# Patient Record
Sex: Female | Born: 1945 | Race: Black or African American | Hispanic: No | Smoking: Former smoker
Health system: Southern US, Community
[De-identification: ages and names within clinical notes are randomized; demographics above are authoritative.]

## PROBLEM LIST (undated history)

## (undated) ENCOUNTER — Emergency Department (HOSPITAL_COMMUNITY): Admission: EM | Payer: Medicare Other | Source: Home / Self Care

## (undated) DIAGNOSIS — M719 Bursopathy, unspecified: Secondary | ICD-10-CM

## (undated) DIAGNOSIS — E119 Type 2 diabetes mellitus without complications: Secondary | ICD-10-CM

## (undated) DIAGNOSIS — A498 Other bacterial infections of unspecified site: Secondary | ICD-10-CM

## (undated) DIAGNOSIS — R296 Repeated falls: Secondary | ICD-10-CM

## (undated) DIAGNOSIS — I10 Essential (primary) hypertension: Secondary | ICD-10-CM

## (undated) DIAGNOSIS — E785 Hyperlipidemia, unspecified: Secondary | ICD-10-CM

## (undated) DIAGNOSIS — M1990 Unspecified osteoarthritis, unspecified site: Secondary | ICD-10-CM

## (undated) DIAGNOSIS — A09 Infectious gastroenteritis and colitis, unspecified: Secondary | ICD-10-CM

## (undated) HISTORY — DX: Type 2 diabetes mellitus without complications: E11.9

## (undated) HISTORY — DX: Other bacterial infections of unspecified site: A49.8

## (undated) HISTORY — DX: Hyperlipidemia, unspecified: E78.5

## (undated) HISTORY — DX: Repeated falls: R29.6

## (undated) HISTORY — PX: COLONOSCOPY: SHX174

## (undated) HISTORY — PX: POLYPECTOMY: SHX149

## (undated) HISTORY — DX: Infectious gastroenteritis and colitis, unspecified: A09

## (undated) HISTORY — PX: TONSILLECTOMY: SUR1361

## (undated) HISTORY — PX: CATARACT EXTRACTION: SUR2

---

## 2003-04-03 ENCOUNTER — Encounter: Admission: RE | Admit: 2003-04-03 | Discharge: 2003-04-03 | Payer: Self-pay | Admitting: Internal Medicine

## 2004-04-28 ENCOUNTER — Emergency Department (HOSPITAL_COMMUNITY): Admission: EM | Admit: 2004-04-28 | Discharge: 2004-04-28 | Payer: Self-pay | Admitting: Family Medicine

## 2004-07-13 ENCOUNTER — Emergency Department (HOSPITAL_COMMUNITY): Admission: EM | Admit: 2004-07-13 | Discharge: 2004-07-13 | Payer: Self-pay | Admitting: Family Medicine

## 2005-01-22 ENCOUNTER — Emergency Department (HOSPITAL_COMMUNITY): Admission: EM | Admit: 2005-01-22 | Discharge: 2005-01-22 | Payer: Self-pay | Admitting: Family Medicine

## 2006-05-03 ENCOUNTER — Encounter: Admission: RE | Admit: 2006-05-03 | Discharge: 2006-05-03 | Payer: Self-pay | Admitting: Internal Medicine

## 2006-05-12 ENCOUNTER — Emergency Department (HOSPITAL_COMMUNITY): Admission: EM | Admit: 2006-05-12 | Discharge: 2006-05-12 | Payer: Self-pay | Admitting: Emergency Medicine

## 2006-11-23 ENCOUNTER — Emergency Department (HOSPITAL_COMMUNITY): Admission: EM | Admit: 2006-11-23 | Discharge: 2006-11-23 | Payer: Self-pay | Admitting: Emergency Medicine

## 2006-12-08 ENCOUNTER — Emergency Department (HOSPITAL_COMMUNITY): Admission: EM | Admit: 2006-12-08 | Discharge: 2006-12-08 | Payer: Self-pay | Admitting: Emergency Medicine

## 2007-01-14 ENCOUNTER — Emergency Department (HOSPITAL_COMMUNITY): Admission: EM | Admit: 2007-01-14 | Discharge: 2007-01-14 | Payer: Self-pay | Admitting: Emergency Medicine

## 2007-01-27 ENCOUNTER — Emergency Department (HOSPITAL_COMMUNITY): Admission: EM | Admit: 2007-01-27 | Discharge: 2007-01-27 | Payer: Self-pay | Admitting: Emergency Medicine

## 2007-05-04 ENCOUNTER — Emergency Department (HOSPITAL_COMMUNITY): Admission: EM | Admit: 2007-05-04 | Discharge: 2007-05-04 | Payer: Self-pay | Admitting: Family Medicine

## 2007-05-04 ENCOUNTER — Encounter: Admission: RE | Admit: 2007-05-04 | Discharge: 2007-05-04 | Payer: Self-pay | Admitting: Internal Medicine

## 2007-05-19 ENCOUNTER — Emergency Department (HOSPITAL_COMMUNITY): Admission: EM | Admit: 2007-05-19 | Discharge: 2007-05-19 | Payer: Self-pay | Admitting: Emergency Medicine

## 2007-05-22 ENCOUNTER — Emergency Department (HOSPITAL_COMMUNITY): Admission: EM | Admit: 2007-05-22 | Discharge: 2007-05-22 | Payer: Self-pay | Admitting: Emergency Medicine

## 2007-06-01 ENCOUNTER — Emergency Department (HOSPITAL_COMMUNITY): Admission: EM | Admit: 2007-06-01 | Discharge: 2007-06-01 | Payer: Self-pay | Admitting: Emergency Medicine

## 2008-05-06 ENCOUNTER — Encounter: Admission: RE | Admit: 2008-05-06 | Discharge: 2008-05-06 | Payer: Self-pay | Admitting: Internal Medicine

## 2008-10-11 ENCOUNTER — Emergency Department (HOSPITAL_COMMUNITY): Admission: EM | Admit: 2008-10-11 | Discharge: 2008-10-11 | Payer: Self-pay | Admitting: Family Medicine

## 2009-05-07 ENCOUNTER — Encounter: Admission: RE | Admit: 2009-05-07 | Discharge: 2009-05-07 | Payer: Self-pay | Admitting: Internal Medicine

## 2009-07-18 ENCOUNTER — Emergency Department (HOSPITAL_COMMUNITY): Admission: EM | Admit: 2009-07-18 | Discharge: 2009-07-18 | Payer: Self-pay | Admitting: Emergency Medicine

## 2010-01-06 ENCOUNTER — Emergency Department (HOSPITAL_COMMUNITY)
Admission: EM | Admit: 2010-01-06 | Discharge: 2010-01-06 | Payer: Self-pay | Source: Home / Self Care | Admitting: Family Medicine

## 2010-03-05 ENCOUNTER — Emergency Department (HOSPITAL_COMMUNITY)
Admission: EM | Admit: 2010-03-05 | Discharge: 2010-03-05 | Disposition: A | Discharge status: Home / Self Care | Payer: MEDICARE | Attending: Emergency Medicine | Admitting: Emergency Medicine

## 2010-03-05 DIAGNOSIS — W1809XA Striking against other object with subsequent fall, initial encounter: Secondary | ICD-10-CM | POA: Insufficient documentation

## 2010-03-05 DIAGNOSIS — Y92009 Unspecified place in unspecified non-institutional (private) residence as the place of occurrence of the external cause: Secondary | ICD-10-CM | POA: Insufficient documentation

## 2010-03-05 DIAGNOSIS — I10 Essential (primary) hypertension: Secondary | ICD-10-CM | POA: Insufficient documentation

## 2010-03-05 DIAGNOSIS — S0990XA Unspecified injury of head, initial encounter: Secondary | ICD-10-CM | POA: Insufficient documentation

## 2010-04-08 ENCOUNTER — Other Ambulatory Visit: Payer: Self-pay | Admitting: Internal Medicine

## 2010-04-08 DIAGNOSIS — Z1231 Encounter for screening mammogram for malignant neoplasm of breast: Secondary | ICD-10-CM

## 2010-05-12 ENCOUNTER — Ambulatory Visit
Admission: RE | Admit: 2010-05-12 | Discharge: 2010-05-12 | Disposition: A | Discharge status: Home / Self Care | Payer: MEDICARE | Source: Ambulatory Visit | Attending: Internal Medicine | Admitting: Internal Medicine

## 2010-05-12 DIAGNOSIS — Z1231 Encounter for screening mammogram for malignant neoplasm of breast: Secondary | ICD-10-CM

## 2010-11-04 LAB — POCT URINALYSIS DIP (DEVICE)
Glucose, UA: 100 — AB
Nitrite: POSITIVE — AB
Operator id: 239701
Urobilinogen, UA: >=8

## 2010-11-04 LAB — URINE CULTURE

## 2010-12-18 ENCOUNTER — Emergency Department (INDEPENDENT_AMBULATORY_CARE_PROVIDER_SITE_OTHER)
Admission: EM | Admit: 2010-12-18 | Discharge: 2010-12-18 | Disposition: A | Discharge status: Home / Self Care | Payer: Medicare Other | Source: Home / Self Care | Attending: Family Medicine | Admitting: Family Medicine

## 2010-12-18 ENCOUNTER — Encounter: Payer: Self-pay | Admitting: Cardiology

## 2010-12-18 DIAGNOSIS — J310 Chronic rhinitis: Secondary | ICD-10-CM

## 2010-12-18 DIAGNOSIS — J40 Bronchitis, not specified as acute or chronic: Secondary | ICD-10-CM

## 2010-12-18 HISTORY — DX: Essential (primary) hypertension: I10

## 2010-12-18 MED ORDER — AZITHROMYCIN 250 MG PO TABS: 250.0000 mg | ORAL_TABLET | Freq: Every day | ORAL | Status: AC

## 2010-12-18 MED ORDER — IBUPROFEN 600 MG PO TABS: 600.0000 mg | ORAL_TABLET | Freq: Four times a day (QID) | ORAL | Status: AC | PRN

## 2010-12-18 MED ORDER — FLUTICASONE PROPIONATE 50 MCG/ACT NA SUSP: 2.0000 | Freq: Every day | NASAL | Status: DC

## 2010-12-18 NOTE — ED Provider Notes (Signed)
History     CSN: [id number] Arrival date & time: 12/18/2010  8:22 AM   First MD Initiated Contact with Patient 12/18/10 0818      Chief Complaint  Patient presents with  . URI  . Nasal Congestion  . Generalized Body Aches  . Cough    (Consider location/radiation/quality/duration/timing/severity/associated sxs/prior treatment) HPI Comments: Kristina Ayala presents for evaluation of chest congestion, cough, nasal congestion and headaches over the last few days. She reports her daughter as a sick contact. She also reports fever and chills.   Patient is a 65 y.o. female presenting with URI and cough. The history is provided by the patient.  URI The primary symptoms include fever, fatigue, headaches, cough, wheezing, myalgias and arthralgias. Primary symptoms do not include sore throat. The current episode started today. This is a new problem. The problem has not changed since onset. The fever began 2 days ago. The fever has been unchanged since its onset. The maximum temperature recorded prior to her arrival was unknown.  The cough began 2 days ago. The cough is new. The cough is productive.  The onset of the illness is associated with exposure to sick contacts. Symptoms associated with the illness include congestion and rhinorrhea.  Cough This is a new problem. The current episode started more than 2 days ago. The problem occurs constantly. The cough is productive of sputum. Associated symptoms include headaches, rhinorrhea, myalgias and wheezing. Pertinent negatives include no sore throat. She has tried nothing for the symptoms.    Past Medical History  Diagnosis Date  . Hypertension     History reviewed. No pertinent past surgical history.  Family History  Problem Relation Age of Onset  . Diverticulosis Mother   . Cancer Mother   . Cancer Father     History  Substance Use Topics  . Smoking status: Current Everyday Smoker -- 0.5 packs/day  . Smokeless tobacco: Not on file  .  Alcohol Use: No    OB History    Grav Para Term Preterm Abortions TAB SAB Ect Mult Living                  Review of Systems  Constitutional: Positive for fever and fatigue.  HENT: Positive for congestion and rhinorrhea. Negative for sore throat.   Eyes: Negative.   Respiratory: Positive for cough and wheezing.   Genitourinary: Negative.   Musculoskeletal: Positive for myalgias and arthralgias.  Skin: Negative.   Neurological: Positive for headaches.    Allergies  Penicillins  Home Medications   Current Outpatient Rx  Name Route Sig Dispense Refill  . HYDROCHLOROTHIAZIDE 25 MG PO TABS Oral Take 25 mg by mouth daily.      [name] LISINOPRIL 30 MG PO TABS Oral Take 30 mg by mouth daily.        BP 168/101  Pulse 120  Temp(Src) 99.9 F (37.7 C) (Oral)  Resp 20  SpO2 98%  Physical Exam  Constitutional: She is oriented to person, place, and time. She appears well-developed and well-nourished.  HENT:  Head: Normocephalic and atraumatic.  Right Ear: Tympanic membrane and external ear normal.  Left Ear: Tympanic membrane and external ear normal.  Nose: Nose normal.  Mouth/Throat: Uvula is midline, oropharynx is clear and moist and mucous membranes are normal.  Eyes: EOM are normal. Pupils are equal, round, and reactive to light.  Neck: Normal range of motion.  Cardiovascular: Normal rate and regular rhythm.   Pulmonary/Chest: Effort normal. She has wheezes in the right  lower field and the left lower field.  Neurological: She is alert and oriented to person, place, and time.  Skin: Skin is warm and dry.    ED Course  Procedures (including critical care time)  Labs Reviewed - No data to display No results found.   No diagnosis found.    MDM          Richardo Priest, MD 12/18/10 [id number]

## 2010-12-18 NOTE — ED Notes (Signed)
Pt reports onset of symptoms yesterday of chest congestion/nasal congestion. Headache and generalized body aches all over. Denies fever. BS CTA bilat.

## 2011-05-03 ENCOUNTER — Other Ambulatory Visit: Payer: Self-pay | Admitting: Internal Medicine

## 2011-05-03 DIAGNOSIS — Z1231 Encounter for screening mammogram for malignant neoplasm of breast: Secondary | ICD-10-CM

## 2011-05-13 ENCOUNTER — Ambulatory Visit
Admission: RE | Admit: 2011-05-13 | Discharge: 2011-05-13 | Disposition: A | Discharge status: Home / Self Care | Payer: Medicare Other | Source: Ambulatory Visit | Attending: Internal Medicine | Admitting: Internal Medicine

## 2011-05-13 ENCOUNTER — Ambulatory Visit: Payer: Medicare Other

## 2011-05-13 DIAGNOSIS — Z1231 Encounter for screening mammogram for malignant neoplasm of breast: Secondary | ICD-10-CM

## 2011-08-02 ENCOUNTER — Encounter (HOSPITAL_COMMUNITY): Payer: Self-pay | Admitting: Emergency Medicine

## 2011-08-02 ENCOUNTER — Emergency Department (HOSPITAL_COMMUNITY)
Admission: EM | Admit: 2011-08-02 | Discharge: 2011-08-02 | Disposition: A | Discharge status: Home / Self Care | Payer: Medicare Other | Source: Home / Self Care | Attending: Emergency Medicine | Admitting: Emergency Medicine

## 2011-08-02 DIAGNOSIS — K922 Gastrointestinal hemorrhage, unspecified: Secondary | ICD-10-CM

## 2011-08-02 DIAGNOSIS — R197 Diarrhea, unspecified: Secondary | ICD-10-CM

## 2011-08-02 MED ORDER — DIPHENOXYLATE-ATROPINE 2.5-0.025 MG PO TABS: 1.0000 | ORAL_TABLET | Freq: Four times a day (QID) | ORAL | Status: DC | PRN

## 2011-08-02 NOTE — ED Provider Notes (Signed)
History     CSN: [id number]  Arrival date & time 08/02/11  1536   First MD Initiated Contact with Patient 08/02/11 1635      Chief Complaint  Patient presents with  . Diarrhea    (Consider location/radiation/quality/duration/timing/severity/associated sxs/prior treatment) HPI Comments: She presents urgent care with resolving diarrhea this started over the weekend, patient feels it she ate out at a [employer], days after eating started feeling some abdominal discomfort and started having diarrheas, liquidy stools that progressively got darker. Patient call her primary care Dr. that otherwise her to go to the emergency department. Patient denies any nausea vomiting or abdominal pain, she describes it within the last 24 hours her stools have become normal again.  Patient at this point denies expressing any abdominal pain, her stools were more formed but still soft but definitely not looking black as stated 2 days ago.  Patient is a 66 y.o. female presenting with diarrhea. The history is provided by the patient.  Diarrhea The primary symptoms include diarrhea. Primary symptoms do not include fever, weight loss, fatigue, abdominal pain, nausea, vomiting, hematemesis, jaundice, dysuria, myalgias, arthralgias or rash. The illness began 2 days ago. The problem has been gradually improving.  The illness does not include chills, anorexia, dysphagia, bloating, constipation, tenesmus, back pain or itching. Associated medical issues do not include liver disease, alcohol abuse, PUD or diverticulitis. Associated medical issues comments: Patient reports having a colonoscopy about 4 years ago where they removed several polyps.    Past Medical History  Diagnosis Date  . Hypertension     History reviewed. No pertinent past surgical history.  Family History  Problem Relation Age of Onset  . Diverticulosis Mother   . Cancer Mother   . Cancer Father     History  Substance Use Topics  . Smoking  status: Current Everyday Smoker -- 0.5 packs/day  . Smokeless tobacco: Not on file  . Alcohol Use: No    OB History    Grav Para Term Preterm Abortions TAB SAB Ect Mult Living                  Review of Systems  Constitutional: Positive for appetite change. Negative for fever, chills, weight loss, activity change and fatigue.  Gastrointestinal: Positive for diarrhea and blood in stool. Negative for dysphagia, nausea, vomiting, abdominal pain, constipation, abdominal distention, anal bleeding, rectal pain, bloating, anorexia, hematemesis and jaundice.  Genitourinary: Negative for dysuria.  Musculoskeletal: Negative for myalgias, back pain and arthralgias.  Skin: Negative for itching and rash.  Neurological: Negative for dizziness and headaches.    Allergies  Penicillins  Home Medications   Current Outpatient Rx  Name Route Sig Dispense Refill  . DIPHENOXYLATE-ATROPINE 2.5-0.025 MG PO TABS Oral Take 1 tablet by mouth 4 (four) times daily as needed for diarrhea or loose stools. 10 tablet 0  . FLUTICASONE PROPIONATE 50 MCG/ACT NA SUSP Nasal Place 2 sprays into the nose daily. 16 g 2  . HYDROCHLOROTHIAZIDE 25 MG PO TABS Oral Take 25 mg by mouth daily.      [name] LISINOPRIL 30 MG PO TABS Oral Take 30 mg by mouth daily.        BP 138/89  Pulse 108  Temp 98.7 F (37.1 C) (Oral)  Resp 18  SpO2 98%  Physical Exam  Nursing note and vitals reviewed. Constitutional: She appears well-developed and well-nourished.  Abdominal: Soft. She exhibits no distension and no mass. There is no tenderness. There is no rebound  and no guarding.  Genitourinary: Rectal exam shows no external hemorrhoid, no internal hemorrhoid, no fissure, no mass, no tenderness and anal tone normal. Guaiac positive stool.       No active rectal bleeding. No visual or palpable hemorrhoids. No and no fissures. Microscopic fragment of stool looked brownish in color . Hemoccult positive  Neurological: She is alert.  Skin:  Skin is warm. No rash noted. No erythema.    ED Course  Procedures (including critical care time)  Labs Reviewed - No data to display No results found.   1. Gastrointestinal bleed   2. Diarrhea       MDM  Resolving suspected GI bleed. Normal abdominal exam. Positive Hemoccult of macroscopic stools with normal color and formed stools. Afebrile, asymptomatic at point of exam. Patient was encouraged to call her gastroenterologist as she is almost due for followup colonoscopy from previous polypectomies.        Jimmie Molly, MD 08/02/11 2035

## 2011-08-02 NOTE — ED Notes (Signed)
Pt here with freq diarrhea that startd over the weekend after eating out @ [employer].states after eating sudden diarrhea which slowed down after taking otc imodium.states she start having black stools and called pcp and was told to go to ER.WENT LAST SAT BUT LEFT DUE TO CROWDING.denies n/v/ or fevers.appetite decreased but stools are back to normal.

## 2011-08-03 ENCOUNTER — Encounter (HOSPITAL_COMMUNITY): Payer: Self-pay | Admitting: Emergency Medicine

## 2011-08-03 ENCOUNTER — Emergency Department (HOSPITAL_COMMUNITY)
Admission: EM | Admit: 2011-08-03 | Discharge: 2011-08-03 | Disposition: A | Discharge status: Home / Self Care | Payer: Medicare Other | Source: Home / Self Care | Attending: Emergency Medicine | Admitting: Emergency Medicine

## 2011-08-03 ENCOUNTER — Emergency Department (HOSPITAL_COMMUNITY): Payer: Medicare Other

## 2011-08-03 DIAGNOSIS — R109 Unspecified abdominal pain: Secondary | ICD-10-CM | POA: Insufficient documentation

## 2011-08-03 DIAGNOSIS — K5289 Other specified noninfective gastroenteritis and colitis: Secondary | ICD-10-CM | POA: Insufficient documentation

## 2011-08-03 DIAGNOSIS — R911 Solitary pulmonary nodule: Secondary | ICD-10-CM

## 2011-08-03 DIAGNOSIS — R197 Diarrhea, unspecified: Secondary | ICD-10-CM | POA: Insufficient documentation

## 2011-08-03 DIAGNOSIS — E876 Hypokalemia: Secondary | ICD-10-CM | POA: Insufficient documentation

## 2011-08-03 DIAGNOSIS — Z79899 Other long term (current) drug therapy: Secondary | ICD-10-CM | POA: Insufficient documentation

## 2011-08-03 DIAGNOSIS — K529 Noninfective gastroenteritis and colitis, unspecified: Secondary | ICD-10-CM

## 2011-08-03 DIAGNOSIS — F17200 Nicotine dependence, unspecified, uncomplicated: Secondary | ICD-10-CM | POA: Insufficient documentation

## 2011-08-03 DIAGNOSIS — R10817 Generalized abdominal tenderness: Secondary | ICD-10-CM | POA: Insufficient documentation

## 2011-08-03 LAB — OCCULT BLOOD, POC DEVICE: Fecal Occult Bld: NEGATIVE

## 2011-08-03 LAB — CBC WITH DIFFERENTIAL/PLATELET
HCT: 42.1 % (ref 36.0–46.0)
Hemoglobin: 13.9 g/dL (ref 12.0–15.0)
Lymphocytes Relative: 36 % (ref 12–46)
MCHC: 33 g/dL (ref 30.0–36.0)
MCV: 82.9 fL (ref 78.0–100.0)
Monocytes Absolute: 0.6 10*3/uL (ref 0.1–1.0)
Monocytes Relative: 7 % (ref 3–12)
Neutro Abs: 4.4 10*3/uL (ref 1.7–7.7)
WBC: 7.9 10*3/uL (ref 4.0–10.5)

## 2011-08-03 LAB — URINALYSIS, ROUTINE W REFLEX MICROSCOPIC
Hgb urine dipstick: NEGATIVE
Protein, ur: NEGATIVE mg/dL
Urobilinogen, UA: 0.2 mg/dL (ref 0.0–1.0)

## 2011-08-03 LAB — BASIC METABOLIC PANEL
Calcium: 9.6 mg/dL (ref 8.4–10.5)
GFR calc Af Amer: >90 mL/min (ref >90)
GFR calc non Af Amer: 89 mL/min — ABNORMAL LOW (ref >90)
Glucose, Bld: 87 mg/dL (ref 70–99)
Sodium: 143 mEq/L (ref 135–145)

## 2011-08-03 MED ORDER — IOHEXOL 300 MG/ML  SOLN
100.0000 mL | Freq: Once | INTRAMUSCULAR | Status: AC | PRN
  Administered 2011-08-03: 100 mL via INTRAVENOUS

## 2011-08-03 MED ORDER — ONDANSETRON HCL 4 MG/2ML IJ SOLN
4.0000 mg | Freq: Once | INTRAMUSCULAR | Status: AC
  Administered 2011-08-03: 4 mg via INTRAVENOUS
  Filled 2011-08-03: qty 2

## 2011-08-03 MED ORDER — CIPROFLOXACIN HCL 500 MG PO TABS: 500.0000 mg | ORAL_TABLET | Freq: Two times a day (BID) | ORAL | Status: DC

## 2011-08-03 MED ORDER — CIPROFLOXACIN HCL 500 MG PO TABS
500.0000 mg | ORAL_TABLET | Freq: Once | ORAL | Status: AC
  Administered 2011-08-03: 500 mg via ORAL
  Filled 2011-08-03: qty 1

## 2011-08-03 MED ORDER — POTASSIUM CHLORIDE CRYS ER 20 MEQ PO TBCR
20.0000 meq | EXTENDED_RELEASE_TABLET | Freq: Once | ORAL | Status: AC
  Administered 2011-08-03: 20 meq via ORAL
  Filled 2011-08-03: qty 1

## 2011-08-03 MED ORDER — METRONIDAZOLE 500 MG PO TABS
500.0000 mg | ORAL_TABLET | Freq: Once | ORAL | Status: AC
  Administered 2011-08-03: 500 mg via ORAL
  Filled 2011-08-03: qty 1

## 2011-08-03 MED ORDER — SODIUM CHLORIDE 0.9 % IV BOLUS (SEPSIS)
1000.0000 mL | Freq: Once | INTRAVENOUS | Status: AC
  Administered 2011-08-03: 1000 mL via INTRAVENOUS

## 2011-08-03 MED ORDER — MORPHINE SULFATE 4 MG/ML IJ SOLN
4.0000 mg | Freq: Once | INTRAMUSCULAR | Status: AC
  Administered 2011-08-03: 4 mg via INTRAVENOUS
  Filled 2011-08-03: qty 1

## 2011-08-03 MED ORDER — METRONIDAZOLE 500 MG PO TABS: 500.0000 mg | ORAL_TABLET | Freq: Two times a day (BID) | ORAL | Status: DC

## 2011-08-03 NOTE — ED Notes (Signed)
Pt here for c/o diarrhea x3 day with some dark in color. Seen at [facility] for same on 7/8, but stated that she not feeling better

## 2011-08-03 NOTE — ED Notes (Signed)
[id number]<AV> Expected date:08/03/11<BR> Expected time:12:42 PM<BR> Means of arrival:Ambulance<BR> Comments:<BR> Abdominal pain/&quot;black&quot; stool

## 2011-08-03 NOTE — ED Provider Notes (Signed)
History     CSN: [id number]  Arrival date & time 08/03/11  1245   First MD Initiated Contact with Patient 08/03/11 1321      Chief Complaint  Patient presents with  . Diarrhea  . Melena    (Consider location/radiation/quality/duration/timing/severity/associated sxs/prior treatment) HPI  66 year old female presents for evaluation of diarrhea. Patient states 4 days ago she went to eat at [facility]. While eating she developed abdominal cramping. She subsequently had multiple bouts of diarrhea along with abdominal cramping for the past 4 days. States diarrhea has been persistent. She started taking Imodium yesterday and noticed that her stools subsequently turns dark. She followup at urgent care for evaluation. At that time she was found to have a positive Hemoccult. She was recommended to come to the ER if symptoms worsen. States the diarrhea persist. She denies fever, chills, and nausea, vomiting, chest pain, shortness of breath, back pain, urinary symptoms. She has not any blood thinning medication. She denies taking NSAIDs on a regular basis. States her last colonoscopy was performed 4 years ago, which shows several polyps. She denies any recent antibiotic use.    Past Medical History  Diagnosis Date  . Hypertension     No past surgical history on file.  Family History  Problem Relation Age of Onset  . Diverticulosis Mother   . Cancer Mother   . Cancer Father     History  Substance Use Topics  . Smoking status: Current Everyday Smoker -- 0.5 packs/day  . Smokeless tobacco: Not on file  . Alcohol Use: No    OB History    Grav Para Term Preterm Abortions TAB SAB Ect Mult Living                  Review of Systems  All other systems reviewed and are negative.    Allergies  Penicillins  Home Medications   Current Outpatient Rx  Name Route Sig Dispense Refill  . DIPHENOXYLATE-ATROPINE 2.5-0.025 MG PO TABS Oral Take 1 tablet by mouth 4 (four) times daily as  needed for diarrhea or loose stools. 10 tablet 0  . FLUTICASONE PROPIONATE 50 MCG/ACT NA SUSP Nasal Place 2 sprays into the nose daily. 16 g 2  . HYDROCHLOROTHIAZIDE 25 MG PO TABS Oral Take 25 mg by mouth daily.      [name] LISINOPRIL 30 MG PO TABS Oral Take 30 mg by mouth daily.        BP 141/89  Pulse 92  Temp 97.9 F (36.6 C) (Oral)  Resp 20  Ht 5\' 6"  (1.676 m)  Wt 158 lb (71.668 kg)  BMI 25.50 kg/m2  SpO2 100%  Physical Exam  Nursing note and vitals reviewed. Constitutional: She appears well-developed and well-nourished. No distress.       Awake, alert, nontoxic appearance  HENT:  Head: Atraumatic.  Eyes: Conjunctivae are normal. Right eye exhibits no discharge. Left eye exhibits no discharge.  Neck: Neck supple.  Cardiovascular: Normal rate and regular rhythm.   Pulmonary/Chest: Effort normal. No respiratory distress. She exhibits no tenderness.  Abdominal: Soft. There is no tenderness. There is no rebound.       Generalized abdominal tenderness without focal point tenderness.  Negative Murphy sign, negative McBurney's point.  No hernia noted.    Genitourinary:       Chaperone present:  Stool with normal appearance.  No palpable or visual evidence of hemorrhoids.  No anal fissure noted. Normal rectal tone  Musculoskeletal: She exhibits no tenderness.  ROM appears intact, no obvious focal weakness  Neurological: She is alert.       Mental status and motor strength appears intact  Skin: No rash noted.  Psychiatric: She has a normal mood and affect.    ED Course  Procedures (including critical care time)  Labs Reviewed - No data to display No results found.   No diagnosis found.  Results for orders placed during the hospital encounter of 08/03/11  CBC WITH DIFFERENTIAL      Component Value Range   WBC 7.9  4.0 - 10.5 K/uL   RBC 5.08  3.87 - 5.11 MIL/uL   Hemoglobin 13.9  12.0 - 15.0 g/dL   HCT 84.6  96.2 - 95.2 %   MCV 82.9  78.0 - 100.0 fL   MCH 27.4  26.0  - 34.0 pg   MCHC 33.0  30.0 - 36.0 g/dL   RDW 84.1  32.4 - 40.1 %   Platelets 215  150 - 400 K/uL   Neutrophils Relative 55  43 - 77 %   Neutro Abs 4.4  1.7 - 7.7 K/uL   Lymphocytes Relative 36  12 - 46 %   Lymphs Abs 2.8  0.7 - 4.0 K/uL   Monocytes Relative 7  3 - 12 %   Monocytes Absolute 0.6  0.1 - 1.0 K/uL   Eosinophils Relative 1  0 - 5 %   Eosinophils Absolute 0.1  0.0 - 0.7 K/uL   Basophils Relative 0  0 - 1 %   Basophils Absolute 0.0  0.0 - 0.1 K/uL  BASIC METABOLIC PANEL      Component Value Range   Sodium 143  135 - 145 mEq/L   Potassium 3.3 (*) 3.5 - 5.1 mEq/L   Chloride 103  96 - 112 mEq/L   CO2 28  19 - 32 mEq/L   Glucose, Bld 87  70 - 99 mg/dL   BUN 10  6 - 23 mg/dL   Creatinine, Ser 0.27  0.50 - 1.10 mg/dL   Calcium 9.6  8.4 - 25.3 mg/dL   GFR calc non Af Amer 89 (*) >90 mL/min   GFR calc Af Amer >90  >90 mL/min  URINALYSIS, ROUTINE W REFLEX MICROSCOPIC      Component Value Range   Color, Urine YELLOW  YELLOW   APPearance CLEAR  CLEAR   Specific Gravity, Urine 1.019  1.005 - 1.030   pH 5.5  5.0 - 8.0   Glucose, UA NEGATIVE  NEGATIVE mg/dL   Hgb urine dipstick NEGATIVE  NEGATIVE   Bilirubin Urine NEGATIVE  NEGATIVE   Ketones, ur NEGATIVE  NEGATIVE mg/dL   Protein, ur NEGATIVE  NEGATIVE mg/dL   Urobilinogen, UA 0.2  0.0 - 1.0 mg/dL   Nitrite NEGATIVE  NEGATIVE   Leukocytes, UA TRACE (*) NEGATIVE  OCCULT BLOOD, POC DEVICE      Component Value Range   Fecal Occult Bld NEGATIVE    URINE MICROSCOPIC-ADD ON      Component Value Range   Squamous Epithelial / LPF RARE  RARE   WBC, UA 0-2  <3 WBC/hpf   Bacteria, UA RARE  RARE   Ct Abdomen Pelvis W Contrast  08/03/2011  *RADIOLOGY REPORT*  Clinical Data: Abdominal pain and diarrhea  CT ABDOMEN AND PELVIS WITH CONTRAST  Technique:  Multidetector CT imaging of the abdomen and pelvis was performed following the standard protocol during bolus administration of intravenous contrast.  Contrast: OMNIPAQUE IOHEXOL  300 MG/ML  SOLN  Comparison: None.  Findings: 5 mm nodule at the right lung base (image #2).  No pericardial fluid.  No focal hepatic lesion.  The gallbladder, pancreas, spleen, adrenal glands, and kidneys are normal.  Stomach appears normal.  There is a high density lobular material within the stomach measuring 2.5 x 1.5 cm.  This may relate to ingested medication or  oral contrast.  The duodenum, small bowel, cecum, and appendix are normal.  There is mild bowel wall thickening of the ascending, transverse, and descending colon.  No free fluid in  the pelvis.  The bladder and uterus and ovaries are normal.  No pelvic lymphadenopathy.  No aggressive osseous lesions.  IMPRESSION:  1.  There is bowel wall thickening throughout the colon suggesting mild pancolitis.  Differential would include infectious colitis, drug induced colitis, or inflammatory colitis. 2.  Normal appendix.  3.  A 5 mm nodule the right lung base.  If the patient is at high risk for bronchogenic carcinoma, follow-up chest CT at 6-12 months is recommended.  If the patient is at low risk for bronchogenic carcinoma, follow-up chest CT at 12 months is recommended.  This recommendation follows the consensus statement: Guidelines for Management of Small Pulmonary Nodules Detected on CT Scans: A Statement from the Fleischner Society as published in Radiology 2005; 237:395-400.  Original Report Authenticated By: Genevive Bi, M.D.   1. Colitis 2. Hypokalemia 3. Pulmonary nodule   MDM  abd pain with persistent diarrhea.  Work up initiated.  Abd/pelvic CT ordered for further evaluation.    5:53 PM Patient feels better after treatment. No nausea, no vomiting. No diarrhea no while in the ED. She is afebrile with stable normal vital signs. She is nontoxic. Abdominal CT scan shows evidence of colitis. Patient will be given Flagyl and Cipro for the next week. She is instructed to eat bland food and follow up with her doctor. Patient also has a 5 mm  nodule in the right lung base, which I recommend with a followup chest CT in 6-12 months. Patient will make the primary care Dr. aware. Patient will be discharge.      Fayrene Helper, PA-C 08/03/11 1755

## 2011-08-03 NOTE — ED Notes (Signed)
Receiving nurse notified

## 2011-08-03 NOTE — ED Notes (Signed)
Iv team called rn attempted to start iv 3 times

## 2011-08-04 ENCOUNTER — Inpatient Hospital Stay (HOSPITAL_COMMUNITY)
Admission: EM | Admit: 2011-08-04 | Discharge: 2011-08-08 | DRG: 373 | Disposition: A | Discharge status: Home / Self Care | Payer: Medicare Other | Attending: Internal Medicine | Admitting: Internal Medicine

## 2011-08-04 ENCOUNTER — Encounter (HOSPITAL_COMMUNITY): Payer: Self-pay | Admitting: *Deleted

## 2011-08-04 DIAGNOSIS — A09 Infectious gastroenteritis and colitis, unspecified: Secondary | ICD-10-CM | POA: Diagnosis present

## 2011-08-04 DIAGNOSIS — R112 Nausea with vomiting, unspecified: Secondary | ICD-10-CM | POA: Diagnosis present

## 2011-08-04 DIAGNOSIS — A0472 Enterocolitis due to Clostridium difficile, not specified as recurrent: Principal | ICD-10-CM | POA: Diagnosis present

## 2011-08-04 DIAGNOSIS — E860 Dehydration: Secondary | ICD-10-CM | POA: Diagnosis present

## 2011-08-04 DIAGNOSIS — R197 Diarrhea, unspecified: Secondary | ICD-10-CM

## 2011-08-04 DIAGNOSIS — K5289 Other specified noninfective gastroenteritis and colitis: Secondary | ICD-10-CM

## 2011-08-04 DIAGNOSIS — E876 Hypokalemia: Secondary | ICD-10-CM | POA: Diagnosis present

## 2011-08-04 DIAGNOSIS — K529 Noninfective gastroenteritis and colitis, unspecified: Secondary | ICD-10-CM

## 2011-08-04 DIAGNOSIS — R1110 Vomiting, unspecified: Secondary | ICD-10-CM

## 2011-08-04 DIAGNOSIS — R109 Unspecified abdominal pain: Secondary | ICD-10-CM

## 2011-08-04 DIAGNOSIS — I10 Essential (primary) hypertension: Secondary | ICD-10-CM | POA: Diagnosis present

## 2011-08-04 HISTORY — DX: Bursopathy, unspecified: M71.9

## 2011-08-04 HISTORY — DX: Unspecified osteoarthritis, unspecified site: M19.90

## 2011-08-04 LAB — CBC WITH DIFFERENTIAL/PLATELET
Basophils Relative: 0 % (ref 0–1)
Eosinophils Absolute: 0 10*3/uL (ref 0.0–0.7)
HCT: 41.8 % (ref 36.0–46.0)
Hemoglobin: 14.3 g/dL (ref 12.0–15.0)
MCH: 27.9 pg (ref 26.0–34.0)
MCHC: 34.2 g/dL (ref 30.0–36.0)
Monocytes Absolute: 0.9 10*3/uL (ref 0.1–1.0)
Monocytes Relative: 10 % (ref 3–12)
Neutro Abs: 6.9 10*3/uL (ref 1.7–7.7)

## 2011-08-04 LAB — COMPREHENSIVE METABOLIC PANEL
ALT: 23 U/L (ref 0–35)
AST: 17 U/L (ref 0–37)
Alkaline Phosphatase: 60 U/L (ref 39–117)
CO2: 25 mEq/L (ref 19–32)
Chloride: 99 mEq/L (ref 96–112)
GFR calc non Af Amer: >90 mL/min (ref >90)
Sodium: 135 mEq/L (ref 135–145)
Total Bilirubin: 0.6 mg/dL (ref 0.3–1.2)

## 2011-08-04 LAB — URINALYSIS, ROUTINE W REFLEX MICROSCOPIC
Glucose, UA: NEGATIVE mg/dL
Ketones, ur: 15 mg/dL — AB
pH: 5 (ref 5.0–8.0)

## 2011-08-04 MED ORDER — POTASSIUM CHLORIDE 10 MEQ/100ML IV SOLN
10.0000 meq | INTRAVENOUS | Status: DC
  Administered 2011-08-04 (×2): 10 meq via INTRAVENOUS
  Filled 2011-08-04 (×2): qty 100

## 2011-08-04 MED ORDER — ACETAMINOPHEN 325 MG PO TABS: 650.0000 mg | ORAL_TABLET | Freq: Four times a day (QID) | ORAL | Status: DC | PRN

## 2011-08-04 MED ORDER — SODIUM CHLORIDE 0.9 % IV BOLUS (SEPSIS)
1000.0000 mL | Freq: Once | INTRAVENOUS | Status: AC
  Administered 2011-08-04: 1000 mL via INTRAVENOUS

## 2011-08-04 MED ORDER — ACETAMINOPHEN 650 MG RE SUPP: 650.0000 mg | Freq: Four times a day (QID) | RECTAL | Status: DC | PRN

## 2011-08-04 MED ORDER — METRONIDAZOLE IN NACL 5-0.79 MG/ML-% IV SOLN
500.0000 mg | Freq: Three times a day (TID) | INTRAVENOUS | Status: DC
  Administered 2011-08-04 – 2011-08-08 (×11): 500 mg via INTRAVENOUS
  Filled 2011-08-04 (×12): qty 100

## 2011-08-04 MED ORDER — MORPHINE SULFATE 4 MG/ML IJ SOLN
4.0000 mg | Freq: Once | INTRAMUSCULAR | Status: AC
  Administered 2011-08-04: 4 mg via INTRAVENOUS
  Filled 2011-08-04: qty 1

## 2011-08-04 MED ORDER — ZOLPIDEM TARTRATE 5 MG PO TABS
5.0000 mg | ORAL_TABLET | Freq: Every evening | ORAL | Status: DC | PRN
  Administered 2011-08-07 (×2): 5 mg via ORAL
  Filled 2011-08-04 (×2): qty 1

## 2011-08-04 MED ORDER — ONDANSETRON HCL 4 MG/2ML IJ SOLN
4.0000 mg | Freq: Once | INTRAMUSCULAR | Status: AC
  Administered 2011-08-04: 4 mg via INTRAVENOUS
  Filled 2011-08-04: qty 2

## 2011-08-04 MED ORDER — SODIUM CHLORIDE 0.9 % IV SOLN: INTRAVENOUS | Status: DC

## 2011-08-04 MED ORDER — DICYCLOMINE HCL 20 MG PO TABS
20.0000 mg | ORAL_TABLET | Freq: Once | ORAL | Status: AC
Start: 2011-08-04 — End: 2011-08-04
  Administered 2011-08-04: 20 mg via ORAL
  Filled 2011-08-04: qty 1

## 2011-08-04 MED ORDER — CIPROFLOXACIN IN D5W 400 MG/200ML IV SOLN
400.0000 mg | Freq: Two times a day (BID) | INTRAVENOUS | Status: DC
  Administered 2011-08-04: 400 mg via INTRAVENOUS
  Filled 2011-08-04 (×2): qty 200

## 2011-08-04 MED ORDER — ONDANSETRON HCL 4 MG PO TABS: 4.0000 mg | ORAL_TABLET | Freq: Four times a day (QID) | ORAL | Status: DC | PRN

## 2011-08-04 MED ORDER — LISINOPRIL 10 MG PO TABS
30.0000 mg | ORAL_TABLET | Freq: Every day | ORAL | Status: DC
  Administered 2011-08-05 – 2011-08-08 (×4): 30 mg via ORAL
  Filled 2011-08-04 (×4): qty 3

## 2011-08-04 MED ORDER — MORPHINE SULFATE 2 MG/ML IJ SOLN
2.0000 mg | INTRAMUSCULAR | Status: DC | PRN
  Administered 2011-08-04 – 2011-08-05 (×2): 2 mg via INTRAVENOUS
  Filled 2011-08-04 (×2): qty 1

## 2011-08-04 MED ORDER — HYDROCODONE-ACETAMINOPHEN 5-325 MG PO TABS
1.0000 | ORAL_TABLET | Freq: Once | ORAL | Status: AC
  Administered 2011-08-04: 1 via ORAL
  Filled 2011-08-04: qty 1

## 2011-08-04 MED ORDER — KCL IN DEXTROSE-NACL 40-5-0.9 MEQ/L-%-% IV SOLN
INTRAVENOUS | Status: DC
  Administered 2011-08-04: 22:00:00 via INTRAVENOUS
  Administered 2011-08-05: 125 mL/h via INTRAVENOUS
  Administered 2011-08-06 (×2): via INTRAVENOUS
  Administered 2011-08-07: 125 mL/h via INTRAVENOUS
  Administered 2011-08-08: 03:00:00 via INTRAVENOUS
  Filled 2011-08-04 (×12): qty 1000

## 2011-08-04 MED ORDER — ONDANSETRON HCL 4 MG/2ML IJ SOLN: 4.0000 mg | Freq: Four times a day (QID) | INTRAMUSCULAR | Status: DC | PRN

## 2011-08-04 NOTE — ED Provider Notes (Signed)
History     CSN: [id number]  Arrival date & time 08/04/11  1604   First MD Initiated Contact with Patient 08/04/11 1841      Chief Complaint  Patient presents with  . Abdominal Pain  . Diarrhea  . Emesis    (Consider location/radiation/quality/duration/timing/severity/associated sxs/prior treatment) HPI  Patient relates July 6 she ate at [place] before she finished eating she started having diarrhea. She states she ate salad. She reports she's having diarrhea about 14 times a day and is loose and watery. She reports today she started having nausea and vomiting after eating. She denies any fever. Patient was seen in the ED yesterday and was diagnosed with pancolitis and she was sent home with antibiotics however she states she is unable to keep them down. She reports she's having diffuse abdominal pain.  PCP Dr.Avbuere  Past Medical History  Diagnosis Date  . Hypertension     History reviewed. No pertinent past surgical history.  Family History  Problem Relation Age of Onset  . Diverticulosis Mother   . Cancer Mother   . Cancer Father     History  Substance Use Topics  . Smoking status: Current Everyday Smoker -- 0.5 packs/day  . Smokeless tobacco: Not on file  . Alcohol Use: No   semiretired  OB History    Grav Para Term Preterm Abortions TAB SAB Ect Mult Living                  Review of Systems  All other systems reviewed and are negative.    Allergies  Doxycycline; Minocycline; and Penicillins  Home Medications   Current Outpatient Rx  Name Route Sig Dispense Refill  . CIPROFLOXACIN HCL 500 MG PO TABS Oral Take 1 tablet (500 mg total) by mouth 2 (two) times daily. 28 tablet 0  . DIPHENOXYLATE-ATROPINE 2.5-0.025 MG PO TABS Oral Take 1 tablet by mouth 4 (four) times daily as needed.    [name] HYDROCHLOROTHIAZIDE 25 MG PO TABS Oral Take 25 mg by mouth daily.      [name] LISINOPRIL 30 MG PO TABS Oral Take 30 mg by mouth daily.      [name] METRONIDAZOLE 500  MG PO TABS Oral Take 1 tablet (500 mg total) by mouth 2 (two) times daily. 28 tablet 0    BP 147/79  Pulse 104  Temp 98.7 F (37.1 C) (Oral)  Resp 18  SpO2 100%  Vital signs normal except tachycardia   Physical Exam  Nursing note and vitals reviewed. Constitutional: She is oriented to person, place, and time. She appears well-developed and well-nourished.  Non-toxic appearance. She does not appear ill. She appears distressed.  HENT:  Head: Normocephalic and atraumatic.  Right Ear: External ear normal.  Left Ear: External ear normal.  Nose: Nose normal. No mucosal edema or rhinorrhea.  Mouth/Throat: Mucous membranes are normal. No dental abscesses or uvula swelling.       Dry tongue  Eyes: Conjunctivae and EOM are normal. Pupils are equal, round, and reactive to light.  Neck: Normal range of motion and full passive range of motion without pain. Neck supple.  Cardiovascular: Normal rate, regular rhythm and normal heart sounds.  Exam reveals no gallop and no friction rub.   No murmur heard. Pulmonary/Chest: Effort normal and breath sounds normal. No respiratory distress. She has no wheezes. She has no rhonchi. She has no rales. She exhibits no tenderness and no crepitus.  Abdominal: Soft. Normal appearance and bowel sounds are  normal. She exhibits no distension. There is tenderness. There is no rebound and no guarding.       diffuse  Musculoskeletal: Normal range of motion. She exhibits no edema and no tenderness.       Moves all extremities well.   Neurological: She is alert and oriented to person, place, and time. She has normal strength. No cranial nerve deficit.  Skin: Skin is warm, dry and intact. No rash noted. No erythema. No pallor.  Psychiatric: Her speech is normal and behavior is normal. Her mood appears not anxious.       Flat, uncomfortable    ED Course  Procedures (including critical care time)   Medications  sodium chloride 0.9 % bolus 1,000 mL (not  administered)  potassium chloride 10 mEq in 100 mL IVPB (not administered)  sodium chloride 0.9 % bolus 1,000 mL (1000 mL Intravenous Given 08/04/11 1904)  morphine 4 MG/ML injection 4 mg (4 mg Intravenous Given 08/04/11 2003)  ondansetron (ZOFRAN) injection 4 mg (4 mg Intravenous Given 08/04/11 2001)    20:03 Dr Conley Rolls admit to med-surg, team 8  Results for orders placed during the hospital encounter of 08/04/11  URINALYSIS, ROUTINE W REFLEX MICROSCOPIC      Component Value Range   Color, Urine YELLOW  YELLOW   APPearance CLEAR  CLEAR   Specific Gravity, Urine 1.027  1.005 - 1.030   pH 5.0  5.0 - 8.0   Glucose, UA NEGATIVE  NEGATIVE mg/dL   Hgb urine dipstick TRACE (*) NEGATIVE   Bilirubin Urine NEGATIVE  NEGATIVE   Ketones, ur 15 (*) NEGATIVE mg/dL   Protein, ur NEGATIVE  NEGATIVE mg/dL   Urobilinogen, UA 0.2  0.0 - 1.0 mg/dL   Nitrite NEGATIVE  NEGATIVE   Leukocytes, UA SMALL (*) NEGATIVE  COMPREHENSIVE METABOLIC PANEL      Component Value Range   Sodium 135  135 - 145 mEq/L   Potassium 3.0 (*) 3.5 - 5.1 mEq/L   Chloride 99  96 - 112 mEq/L   CO2 25  19 - 32 mEq/L   Glucose, Bld 113 (*) 70 - 99 mg/dL   BUN 6  6 - 23 mg/dL   Creatinine, Ser 1.61  0.50 - 1.10 mg/dL   Calcium 9.4  8.4 - 09.6 mg/dL   Total Protein 6.8  6.0 - 8.3 g/dL   Albumin 3.8  3.5 - 5.2 g/dL   AST 17  0 - 37 U/L   ALT 23  0 - 35 U/L   Alkaline Phosphatase 60  39 - 117 U/L   Total Bilirubin 0.6  0.3 - 1.2 mg/dL   GFR calc non Af Amer >90  >90 mL/min   GFR calc Af Amer >90  >90 mL/min  URINE MICROSCOPIC-ADD ON      Component Value Range   Squamous Epithelial / LPF RARE  RARE   WBC, UA 0-2  <3 WBC/hpf   Bacteria, UA RARE  RARE  CBC WITH DIFFERENTIAL      Component Value Range   WBC 9.1  4.0 - 10.5 K/uL   RBC 5.13 (*) 3.87 - 5.11 MIL/uL   Hemoglobin 14.3  12.0 - 15.0 g/dL   HCT 04.5  40.9 - 81.1 %   MCV 81.5  78.0 - 100.0 fL   MCH 27.9  26.0 - 34.0 pg   MCHC 34.2  30.0 - 36.0 g/dL   RDW 91.4  78.2 -  95.6 %   Platelets 212  150 -  400 K/uL   Neutrophils Relative 76  43 - 77 %   Neutro Abs 6.9  1.7 - 7.7 K/uL   Lymphocytes Relative 14  12 - 46 %   Lymphs Abs 1.3  0.7 - 4.0 K/uL   Monocytes Relative 10  3 - 12 %   Monocytes Absolute 0.9  0.1 - 1.0 K/uL   Eosinophils Relative 0  0 - 5 %   Eosinophils Absolute 0.0  0.0 - 0.7 K/uL   Basophils Relative 0  0 - 1 %   Basophils Absolute 0.0  0.0 - 0.1 K/uL   Laboratory interpretation all normal except hypokalemia   Ct Abdomen Pelvis W Contrast  08/03/2011  *RADIOLOGY REPORT*  Clinical Data: Abdominal pain and diarrhea  CT ABDOMEN AND PELVIS WITH CONTRAST  Technique:  Multidetector CT imaging of the abdomen and pelvis was performed following the standard protocol during bolus administration of intravenous contrast.  Contrast: OMNIPAQUE IOHEXOL 300 MG/ML  SOLN  Comparison: None.  Findings: 5 mm nodule at the right lung base (image #2).  No pericardial fluid.  No focal hepatic lesion.  The gallbladder, pancreas, spleen, adrenal glands, and kidneys are normal.  Stomach appears normal.  There is a high density lobular material within the stomach measuring 2.5 x 1.5 cm.  This may relate to ingested medication or  oral contrast.  The duodenum, small bowel, cecum, and appendix are normal.  There is mild bowel wall thickening of the ascending, transverse, and descending colon.  No free fluid in  the pelvis.  The bladder and uterus and ovaries are normal.  No pelvic lymphadenopathy.  No aggressive osseous lesions.  IMPRESSION:  1.  There is bowel wall thickening throughout the colon suggesting mild pancolitis.  Differential would include infectious colitis, drug induced colitis, or inflammatory colitis. 2.  Normal appendix.  3.  A 5 mm nodule the right lung base.  If the patient is at high risk for bronchogenic carcinoma, follow-up chest CT at 6-12 months is recommended.  If the patient is at low risk for bronchogenic carcinoma, follow-up chest CT at 12 months  is recommended.  This recommendation follows the consensus statement: Guidelines for Management of Small Pulmonary Nodules Detected on CT Scans: A Statement from the Fleischner Society as published in Radiology 2005; 237:395-400.  Original Report Authenticated By: Genevive Bi, M.D.     1. Colitis   2. Vomiting and diarrhea   3. Dehydration   4. Hypokalemia   5. Abdominal pain     Plan admission  Devoria Albe, MD, FACEP   MDM          Ward Givens, MD 08/04/11 2008

## 2011-08-04 NOTE — ED Notes (Signed)
Notified Dr. Lynelle Doctor that pt is requesting pain medicine.

## 2011-08-04 NOTE — ED Notes (Signed)
Pt states she was seen here yesterday and treated, continues to have abd pain, nausea, vomiting and diarrhea, feels like she is bloated and having increased pain, symptoms started Sat after eating out

## 2011-08-04 NOTE — H&P (Signed)
PCP:   Dorrene German, MD   Chief Complaint: Sudden emergency room visit with persistent diarrhea.   HPI: Kristina Ayala is an 66 y.o. female with benign past medical history of hypertension on ACE inhibitor and diuretic, in her usual state of health until one week ago when she had eaten at a [employer], developed abdominal cramps nausea and watery diarrhea. She denied any fever or chills. No other ill contact. She denied any distant travel. She was seen in emergency room and was put on Cipro by mouth, but has not been in the second medication due to persistent nausea and vomiting. Her first visit resulted in a CT scan of the abdomen which showed pan colitis. She denied any recent antibiotic use prior to this event. Reevaluation today in emergency room included a normal white count of 9.1 thousand, hemoglobin of 14.3 g per DL, potassium of 3.0 and normal renal function tests. She was given intravenous fluid along with potassium in emergency room, and hospitalist was asked to admit her for symptomatic treatment of her possible infectious colitis. Incidentally, the CT scan also picked up a 5 mm pulmonary nodule.  Rewiew of Systems:  The patient denies anorexia, fever, weight loss,, vision loss, decreased hearing, hoarseness, chest pain, syncope, dyspnea on exertion, peripheral edema, balance deficits, hemoptysis, melena, hematochezia, severe indigestion/heartburn, hematuria, incontinence, genital sores, muscle weakness, suspicious skin lesions, transient blindness, difficulty walking, depression, unusual weight change, abnormal bleeding, enlarged lymph nodes, angioedema, and breast masses.    Past Medical History  Diagnosis Date  . Hypertension   . Arthritis   . Bursitis     left elbow    Past Surgical History  Procedure Date  . No past surgeries     Medications:  HOME MEDS: Prior to Admission medications   Medication Sig Start Date End Date Taking? Authorizing Provider    ciprofloxacin (CIPRO) 500 MG tablet Take 1 tablet (500 mg total) by mouth 2 (two) times daily. 08/03/11 08/13/11 Yes Fayrene Helper, PA-C  diphenoxylate-atropine (LOMOTIL) 2.5-0.025 MG per tablet Take 1 tablet by mouth 4 (four) times daily as needed. 08/02/11 08/12/11 Yes Jimmie Molly, MD  hydrochlorothiazide (HYDRODIURIL) 25 MG tablet Take 25 mg by mouth daily.     Yes Historical Provider, MD  lisinopril (PRINIVIL,ZESTRIL) 30 MG tablet Take 30 mg by mouth daily.     Yes Historical Provider, MD  metroNIDAZOLE (FLAGYL) 500 MG tablet Take 1 tablet (500 mg total) by mouth 2 (two) times daily. 08/03/11 08/13/11 Yes Fayrene Helper, PA-C     Allergies:  Allergies  Allergen Reactions  . Doxycycline Diarrhea  . Minocycline Diarrhea  . Penicillins Rash    Social History:   reports that she has been smoking.  She has never used smokeless tobacco. She reports that she does not drink alcohol or use illicit drugs.  Family History: Family History  Problem Relation Age of Onset  . Diverticulosis Mother   . Cancer Mother   . Cancer Father      Physical Exam: Filed Vitals:   08/04/11 1702 08/04/11 1915 08/04/11 1930 08/04/11 2000  BP: 147/79 162/80 153/75 155/87  Pulse: 104 100 99 102  Temp: 98.7 F (37.1 C)     TempSrc: Oral     Resp: 18 19 13    SpO2: 100% 99% 99% 98%   Blood pressure 155/87, pulse 102, temperature 98.7 F (37.1 C), temperature source Oral, resp. rate 13, SpO2 98.00%.  GEN:  Pleasant  person lying in the stretcher in  no acute distress; cooperative with exam PSYCH:  alert and oriented x4; does not appear anxious or depressed; affect is appropriate. HEENT: Mucous membranes pink and anicteric; PERRLA; EOM intact; no cervical lymphadenopathy nor thyromegaly or carotid bruit; no JVD; Breasts:: Not examined CHEST WALL: No tenderness CHEST: Normal respiration, clear to auscultation bilaterally HEART: Regular rate and rhythm; no murmurs rubs or gallops BACK: No kyphosis or scoliosis; no CVA  tenderness ABDOMEN: Obese, soft mildly tender diffusely but no rebound.; no masses, no organomegaly, normal abdominal bowel sounds; no pannus; no intertriginous candida. Rectal Exam: Not done EXTREMITIES: No bone or joint deformity; age-appropriate arthropathy of the hands and knees; no edema; no ulcerations. Genitalia: not examined PULSES: 2+ and symmetric SKIN: Normal hydration no rash or ulceration CNS: Cranial nerves 2-12 grossly intact no focal lateralizing neurologic deficit   Labs & Imaging Results for orders placed during the hospital encounter of 08/04/11 (from the past 48 hour(s))  URINALYSIS, ROUTINE W REFLEX MICROSCOPIC     Status: Abnormal   Collection Time   08/04/11  5:12 PM      Component Value Range Comment   Color, Urine YELLOW  YELLOW    APPearance CLEAR  CLEAR    Specific Gravity, Urine 1.027  1.005 - 1.030    pH 5.0  5.0 - 8.0    Glucose, UA NEGATIVE  NEGATIVE mg/dL    Hgb urine dipstick TRACE (*) NEGATIVE    Bilirubin Urine NEGATIVE  NEGATIVE    Ketones, ur 15 (*) NEGATIVE mg/dL    Protein, ur NEGATIVE  NEGATIVE mg/dL    Urobilinogen, UA 0.2  0.0 - 1.0 mg/dL    Nitrite NEGATIVE  NEGATIVE    Leukocytes, UA SMALL (*) NEGATIVE   URINE MICROSCOPIC-ADD ON     Status: Normal   Collection Time   08/04/11  5:12 PM      Component Value Range Comment   Squamous Epithelial / LPF RARE  RARE    WBC, UA 0-2  <3 WBC/hpf    Bacteria, UA RARE  RARE   COMPREHENSIVE METABOLIC PANEL     Status: Abnormal   Collection Time   08/04/11  5:20 PM      Component Value Range Comment   Sodium 135  135 - 145 mEq/L DELTA CHECK NOTED   Potassium 3.0 (*) 3.5 - 5.1 mEq/L    Chloride 99  96 - 112 mEq/L    CO2 25  19 - 32 mEq/L    Glucose, Bld 113 (*) 70 - 99 mg/dL    BUN 6  6 - 23 mg/dL    Creatinine, Ser 4.09  0.50 - 1.10 mg/dL    Calcium 9.4  8.4 - 81.1 mg/dL    Total Protein 6.8  6.0 - 8.3 g/dL    Albumin 3.8  3.5 - 5.2 g/dL    AST 17  0 - 37 U/L    ALT 23  0 - 35 U/L    Alkaline  Phosphatase 60  39 - 117 U/L    Total Bilirubin 0.6  0.3 - 1.2 mg/dL    GFR calc non Af Amer >90  >90 mL/min    GFR calc Af Amer >90  >90 mL/min   CBC WITH DIFFERENTIAL     Status: Abnormal   Collection Time   08/04/11  6:20 PM      Component Value Range Comment   WBC 9.1  4.0 - 10.5 K/uL    RBC 5.13 (*) 3.87 - 5.11  MIL/uL    Hemoglobin 14.3  12.0 - 15.0 g/dL    HCT 40.9  81.1 - 91.4 %    MCV 81.5  78.0 - 100.0 fL    MCH 27.9  26.0 - 34.0 pg    MCHC 34.2  30.0 - 36.0 g/dL    RDW 78.2  95.6 - 21.3 %    Platelets 212  150 - 400 K/uL    Neutrophils Relative 76  43 - 77 %    Neutro Abs 6.9  1.7 - 7.7 K/uL    Lymphocytes Relative 14  12 - 46 %    Lymphs Abs 1.3  0.7 - 4.0 K/uL    Monocytes Relative 10  3 - 12 %    Monocytes Absolute 0.9  0.1 - 1.0 K/uL    Eosinophils Relative 0  0 - 5 %    Eosinophils Absolute 0.0  0.0 - 0.7 K/uL    Basophils Relative 0  0 - 1 %    Basophils Absolute 0.0  0.0 - 0.1 K/uL    Ct Abdomen Pelvis W Contrast  08/03/2011  *RADIOLOGY REPORT*  Clinical Data: Abdominal pain and diarrhea  CT ABDOMEN AND PELVIS WITH CONTRAST  Technique:  Multidetector CT imaging of the abdomen and pelvis was performed following the standard protocol during bolus administration of intravenous contrast.  Contrast: OMNIPAQUE IOHEXOL 300 MG/ML  SOLN  Comparison: None.  Findings: 5 mm nodule at the right lung base (image #2).  No pericardial fluid.  No focal hepatic lesion.  The gallbladder, pancreas, spleen, adrenal glands, and kidneys are normal.  Stomach appears normal.  There is a high density lobular material within the stomach measuring 2.5 x 1.5 cm.  This may relate to ingested medication or  oral contrast.  The duodenum, small bowel, cecum, and appendix are normal.  There is mild bowel wall thickening of the ascending, transverse, and descending colon.  No free fluid in  the pelvis.  The bladder and uterus and ovaries are normal.  No pelvic lymphadenopathy.  No aggressive osseous  lesions.  IMPRESSION:  1.  There is bowel wall thickening throughout the colon suggesting mild pancolitis.  Differential would include infectious colitis, drug induced colitis, or inflammatory colitis. 2.  Normal appendix.  3.  A 5 mm nodule the right lung base.  If the patient is at high risk for bronchogenic carcinoma, follow-up chest CT at 6-12 months is recommended.  If the patient is at low risk for bronchogenic carcinoma, follow-up chest CT at 12 months is recommended.  This recommendation follows the consensus statement: Guidelines for Management of Small Pulmonary Nodules Detected on CT Scans: A Statement from the Fleischner Society as published in Radiology 2005; 237:395-400.  Original Report Authenticated By: Genevive Bi, M.D.      Assessment #1 diarrhea #2 pancolitis, likely infectious #3 hypokalemia #4 hypertension #5 5 mm pulmonary nodule  PLAN:  I suspect that her persistent diarrhea, nausea, vomiting and resulting hypokalemia is resolved of infectious colitis. She is overall stable and will admit her to general medical floor. We'll give intravenous fluid along with aggressive repletion of her potassium. We'll continue her lisinopril, but will stop her diuretic. I will go ahead and start on Cipro and Flagyl intravenously. I suspect this is very self-limited who brought process.  She should followup with her primary care physician for her 5 mm pulmonary nodule.   She would benefit getting a colonoscopy as an outpatient as well and would defer that  to her primary care physician. She is stable, full code, and will be admitted [facility].  Other plans as per orders.    Kristina Ayala 08/04/2011, 9:20 PM     And and may or may

## 2011-08-05 DIAGNOSIS — A0472 Enterocolitis due to Clostridium difficile, not specified as recurrent: Secondary | ICD-10-CM | POA: Diagnosis present

## 2011-08-05 DIAGNOSIS — R109 Unspecified abdominal pain: Secondary | ICD-10-CM

## 2011-08-05 LAB — BASIC METABOLIC PANEL
CO2: 27 mEq/L (ref 19–32)
Chloride: 103 mEq/L (ref 96–112)
Glucose, Bld: 126 mg/dL — ABNORMAL HIGH (ref 70–99)
Potassium: 2.9 mEq/L — ABNORMAL LOW (ref 3.5–5.1)
Sodium: 138 mEq/L (ref 135–145)

## 2011-08-05 LAB — CLOSTRIDIUM DIFFICILE BY PCR: Toxigenic C. Difficile by PCR: POSITIVE — AB

## 2011-08-05 LAB — CBC
Hemoglobin: 12.3 g/dL (ref 12.0–15.0)
MCHC: 33 g/dL (ref 30.0–36.0)
MCV: 83.3 fL (ref 78.0–100.0)
Platelets: 210 10*3/uL (ref 150–400)
RBC: 4.48 MIL/uL (ref 3.87–5.11)
RDW: 14 % (ref 11.5–15.5)
WBC: 7.4 10*3/uL (ref 4.0–10.5)

## 2011-08-05 MED ORDER — MORPHINE SULFATE 2 MG/ML IJ SOLN
2.0000 mg | INTRAMUSCULAR | Status: DC | PRN
  Administered 2011-08-05 – 2011-08-08 (×17): 2 mg via INTRAVENOUS
  Filled 2011-08-05 (×18): qty 1

## 2011-08-05 MED ORDER — POTASSIUM CHLORIDE CRYS ER 20 MEQ PO TBCR
40.0000 meq | EXTENDED_RELEASE_TABLET | Freq: Once | ORAL | Status: AC
  Administered 2011-08-05: 40 meq via ORAL
  Filled 2011-08-05: qty 2

## 2011-08-05 MED ORDER — CIPROFLOXACIN IN D5W 400 MG/200ML IV SOLN
400.0000 mg | Freq: Two times a day (BID) | INTRAVENOUS | Status: DC
Start: 2011-08-05 — End: 2011-08-08
  Administered 2011-08-05 – 2011-08-07 (×6): 400 mg via INTRAVENOUS
  Filled 2011-08-05 (×7): qty 200

## 2011-08-05 NOTE — ED Provider Notes (Signed)
Medical screening examination/treatment/procedure(s) were performed by non-physician practitioner and as supervising physician I was immediately available for consultation/collaboration.  Toy Baker, MD 08/05/11 [phone]

## 2011-08-05 NOTE — Progress Notes (Signed)
[facility] PROGRESS NOTE  Kristina Ayala ZOX:[id number] DOB: 08/18/1945 DOA: 08/04/2011 PCP: Dorrene German, MD  Brief narrative: 66 year old female with PMHx of HTN who was admitted for persistent diarrhea and associated cramp like abdominal pain.  Assessment/Plan:  Principal Problem:  *C. difficile colitis - patient started on cipro and flagyl - patient continue to have diarrhea - provide morphine PRN 2 mg Q 2 hours IV for moderate abdominal pain  Active Problems:  Nausea & vomiting - antiemetics PRN - patient reports nausea and vomiting resolved - diet advanced to reglar   Dehydration - secondary to infectious colitis - continue IV fluids for now - advance diet as tolerated   Hypokalemia - secondary to GI losses, diarrhea - supplemented - follow up BMP in am   Hypertension - continue home medications  Code Status: full code Family Communication: none at bedside Disposition Plan: home in next 24 -48 hours  Antibiotics: 1. Cipro 08/05/2011 --> 2. Flagyl 08/05/2011 -->  Manson Passey, MD  [facility] Pager [phone]  If 7PM-7AM, please contact night-coverage [email or web address] Password [facility] 08/05/2011, 3:36 PM   LOS: 1 day   HPI/Subjective: No acute events overnight; continues to have diarrhea  Objective: Filed Vitals:   08/04/11 2120 08/04/11 2132 08/05/11 0610 08/05/11 1400  BP: 161/95 161/95 125/76 145/84  Pulse: 104 104 93 97  Temp: 99.1 F (37.3 C) 99.1 F (37.3 C) 99 F (37.2 C) 99.4 F (37.4 C)  TempSrc: Oral Oral Oral Oral  Resp: 16 16 16 22   Height:  5\' 6"  (1.676 m)    Weight:  158 lb (71.668 kg)    SpO2: 98% 98% 99% 99%    Intake/Output Summary (Last 24 hours) at 08/05/11 1536 Last data filed at 08/05/11 8295  Gross per 24 hour  Intake   1044 ml  Output      0 ml  Net   1044 ml    Exam:   General:  Pt is alert, follows commands appropriately, not in acute distress  Cardiovascular: Regular rate and  rhythm, S1/S2, no murmurs, no rubs, no gallops  Respiratory: Clear to auscultation bilaterally, no wheezing, no crackles, no rhonchi  Abdomen: Soft, non tender, non distended, bowel sounds present, no guarding  Extremities: No edema, pulses DP and PT palpable bilaterally  Neuro: Grossly nonfocal  Data Reviewed: Basic Metabolic Panel:  Lab 08/05/11 6213 08/04/11 1720 08/03/11 1402  NA 138 135 143  K 2.9* 3.0* 3.3*  CL 103 99 103  CO2 27 25 28   GLUCOSE 126* 113* 87  BUN 4* 6 10  CREATININE 0.77 0.63 0.67  CALCIUM 8.4 9.4 9.6   Liver Function Tests:  Lab 08/04/11 1720  AST 17  ALT 23  ALKPHOS 60  BILITOT 0.6  PROT 6.8  ALBUMIN 3.8   CBC:  Lab 08/05/11 0328 08/04/11 1820 08/03/11 1402  WBC 7.4 9.1 7.9  HGB 12.3 14.3 13.9  HCT 37.3 41.8 42.1  MCV 83.3 81.5 82.9  PLT 210 212 215    Recent Results (from the past 240 hour(s))  CLOSTRIDIUM DIFFICILE BY PCR     Status: Abnormal   Collection Time   08/04/11 10:33 PM      Component Value Range Status Comment   C difficile by pcr POSITIVE (*) NEGATIVE Final      Studies: Ct Abdomen Pelvis W Contrast 08/03/2011   IMPRESSION:  1.  There is bowel wall thickening throughout the colon suggesting mild pancolitis.  Differential would include  infectious colitis, drug induced colitis, or inflammatory colitis. 2.  Normal appendix.  3.  A 5 mm nodule the right lung base.  If the patient is at high risk for bronchogenic carcinoma, follow-up chest CT at 6-12 months is recommended.  If the patient is at low risk for bronchogenic carcinoma, follow-up chest CT at 12 months is recommended.  This recommendation follows the consensus statement: Guidelines for Management of Small Pulmonary Nodules Detected on CT Scans: A Statement from the Fleischner Society as published in Radiology 2005; 237:395-400.     Scheduled Meds:   . ciprofloxacin  400 mg Intravenous Q12H  . dicyclomine  20 mg Oral Once  . HYDROcodone-acetaminophen  1 tablet Oral  Once  . lisinopril  30 mg Oral Daily  . metronidazole  500 mg Intravenous Q8H  .  morphine injection  4 mg Intravenous Once  . ondansetron IV  4 mg Intravenous Once  . potassium chloride  40 mEq Oral Once   Continuous Infusions:   . dextrose 5 % and 0.9 % NaCl with KCl 40 mEq/L 125 mL/hr (08/05/11 0951)

## 2011-08-06 MED ORDER — POTASSIUM CHLORIDE 10 MEQ/100ML IV SOLN
10.0000 meq | INTRAVENOUS | Status: AC
  Administered 2011-08-06 (×3): 10 meq via INTRAVENOUS
  Filled 2011-08-06 (×3): qty 100

## 2011-08-06 MED ORDER — SIMETHICONE 80 MG PO CHEW
80.0000 mg | CHEWABLE_TABLET | Freq: Four times a day (QID) | ORAL | Status: DC | PRN
  Administered 2011-08-06 – 2011-08-07 (×3): 80 mg via ORAL
  Filled 2011-08-06 (×4): qty 1

## 2011-08-06 MED ORDER — POTASSIUM CHLORIDE CRYS ER 20 MEQ PO TBCR
40.0000 meq | EXTENDED_RELEASE_TABLET | Freq: Once | ORAL | Status: AC
  Administered 2011-08-06: 40 meq via ORAL
  Filled 2011-08-06: qty 2

## 2011-08-06 MED ORDER — HYOSCYAMINE SULFATE 0.125 MG PO TABS
0.1250 mg | ORAL_TABLET | ORAL | Status: DC | PRN
  Filled 2011-08-06: qty 1

## 2011-08-06 NOTE — Progress Notes (Signed)
[facility] PROGRESS NOTE  Kristina Ayala GNF:[id number] DOB: 1945/07/15 DOA: 08/04/2011 PCP: Dorrene German, MD  Brief narrative:  66 year old female with PMHx of HTN who was admitted for persistent diarrhea and associated cramp like abdominal pain.   Assessment/Plan:   Principal Problem:  *C. difficile colitis  - patient started on cipro and flagyl  - patient continue to have diarrhea  - provide morphine PRN 2 mg Q 2 hours IV for moderate abdominal pain  - added Levsin and simethicone for cramps and flatulence respectively  Active Problems:  Nausea & vomiting  - antiemetics PRN  - patient reports nausea and vomiting resolved  - diet advanced to reglar  Dehydration  - secondary to infectious colitis  - continue IV fluids for now  - advance diet as tolerated  Hypokalemia  - secondary to GI losses, diarrhea  - supplement today again with potassium chloride 10 meq X 3 doses IV and then PO 40 meq x 1 dose - follow up BMP in am  Hypertension  - continue home medications   Code Status: full code  Family Communication: none at bedside  Disposition Plan: home when stable  Antibiotics:  1. Cipro 08/05/2011 -->  2. Flagyl 08/05/2011 -->     Manson Passey, MD  [facility] Pager [phone]  If 7PM-7AM, please contact night-coverage [email or web address] Password [facility] 08/06/2011, 2:19 PM   LOS: 2 days   HPI/Subjective: No acute events overnight. Patient continues to have diarrhea 13 BM in last 24 hours.  Objective: Filed Vitals:   08/05/11 2143 08/06/11 0559 08/06/11 1034 08/06/11 1418  BP: 158/91 161/81 162/97 143/87  Pulse: 101 93  89  Temp: 98.3 F (36.8 C) 98.1 F (36.7 C)  99.5 F (37.5 C)  TempSrc: Oral Oral  Oral  Resp: 20 18  20   Height:      Weight:      SpO2: 97% 99%  99%    Intake/Output Summary (Last 24 hours) at 08/06/11 1419 Last data filed at 08/05/11 1500  Gross per 24 hour  Intake   1000 ml  Output      0 ml  Net   1000  ml    Exam:   General:  Pt is alert, follows commands appropriately, not in acute distress  Cardiovascular: Regular rate and rhythm, S1/S2, no murmurs, no rubs, no gallops  Respiratory: Clear to auscultation bilaterally, no wheezing, no crackles, no rhonchi  Abdomen: Soft, non tender, non distended, bowel sounds present, no guarding  Extremities: No edema, pulses DP and PT palpable bilaterally  Neuro: Grossly nonfocal  Data Reviewed: Basic Metabolic Panel:  Lab 08/05/11 5284 08/04/11 1720 08/03/11 1402  NA 138 135 143  K 2.9* 3.0* 3.3*  CL 103 99 103  CO2 27 25 28   GLUCOSE 126* 113* 87  BUN 4* 6 10  CREATININE 0.77 0.63 0.67  CALCIUM 8.4 9.4 9.6  MG -- -- --  PHOS -- -- --   Liver Function Tests:  Lab 08/04/11 1720  AST 17  ALT 23  ALKPHOS 60  BILITOT 0.6  PROT 6.8  ALBUMIN 3.8   CBC:  Lab 08/05/11 0328 08/04/11 1820 08/03/11 1402  WBC 7.4 9.1 7.9  HGB 12.3 14.3 13.9  HCT 37.3 41.8 42.1  MCV 83.3 81.5 82.9  PLT 210 212 215     Recent Results (from the past 240 hour(s))  CLOSTRIDIUM DIFFICILE BY PCR     Status: Abnormal   Collection Time  08/04/11 10:33 PM      Component Value Range Status Comment   C difficile by pcr POSITIVE (*) NEGATIVE Final      Studies: No results found.  Scheduled Meds:   . ciprofloxacin  400 mg Intravenous Q12H  . lisinopril  30 mg Oral Daily  . metronidazole  500 mg Intravenous Q8H   Continuous Infusions:   . dextrose 5 % and 0.9 % NaCl with KCl 40 mEq/L 125 mL/hr at 08/06/11 [phone]

## 2011-08-07 LAB — BASIC METABOLIC PANEL
Calcium: 8.8 mg/dL (ref 8.4–10.5)
Chloride: 104 mEq/L (ref 96–112)
Creatinine, Ser: 0.68 mg/dL (ref 0.50–1.10)
GFR calc Af Amer: >90 mL/min (ref >90)

## 2011-08-07 LAB — CBC
MCV: 82 fL (ref 78.0–100.0)
Platelets: 230 10*3/uL (ref 150–400)
RDW: 14.2 % (ref 11.5–15.5)
WBC: 7.2 10*3/uL (ref 4.0–10.5)

## 2011-08-07 NOTE — Progress Notes (Signed)
[facility] PROGRESS NOTE  Kristina Ayala ZOX:[id number] DOB: February 12, 1945 DOA: 08/04/2011 PCP: Dorrene German, MD  Brief narrative:  66 year old female with PMHx of HTN who was admitted for persistent diarrhea and associated cramp like abdominal pain.   Assessment/Plan:   Principal Problem:  *C. difficile colitis  - patient started on cipro and flagyl  - patient continue to have diarrhea  - provide morphine PRN 2 mg Q 2 hours IV for moderate abdominal pain  - added Levsin and simethicone for cramps and flatulence respectively   Active Problems:  Nausea & vomiting  - antiemetics PRN  - patient reports nausea and vomiting resolved  - diet advanced to reglar  Dehydration  - secondary to infectious colitis  - continue IV fluids for now  - advance diet as tolerated  Hypokalemia  - secondary to GI losses, diarrhea  - supplemented  - follow up BMP this am  Hypertension  - continue home medications   Code Status: full code  Family Communication: none at bedside  Disposition Plan: home when stable; D/C plan in next 24 hours  Antibiotics:  1. Cipro 08/05/2011 -->  2. Flagyl 08/05/2011 -->    Manson Passey, MD  [facility] Pager [phone]  If 7PM-7AM, please contact night-coverage [email or web address] Password [facility] 08/07/2011, 8:42 AM   LOS: 3 days   HPI/Subjective: No acute distress  Objective: Filed Vitals:   08/06/11 1034 08/06/11 1418 08/06/11 2112 08/07/11 0635  BP: 162/97 143/87 164/85 155/93  Pulse:  89 81 90  Temp:  99.5 F (37.5 C) 99.1 F (37.3 C) 97.5 F (36.4 C)  TempSrc:  Oral Oral Axillary  Resp:  20 18 18   Height:      Weight:      SpO2:  99% 99% 99%   No intake or output data in the 24 hours ending 08/07/11 0842  Exam:   General:  Pt is alert, follows commands appropriately, not in acute distress  Cardiovascular: Regular rate and rhythm, S1/S2, no murmurs, no rubs, no gallops  Respiratory: Clear to auscultation  bilaterally, no wheezing, no crackles, no rhonchi  Abdomen: Soft, non tender, non distended, bowel sounds present, no guarding  Extremities: No edema, pulses DP and PT palpable bilaterally  Neuro: Grossly nonfocal  Data Reviewed: Basic Metabolic Panel:  Lab 08/05/11 8295 08/04/11 1720 08/03/11 1402  NA 138 135 143  K 2.9* 3.0* 3.3*  CL 103 99 103  CO2 27 25 28   GLUCOSE 126* 113* 87  BUN 4* 6 10  CREATININE 0.77 0.63 0.67  CALCIUM 8.4 9.4 9.6   Liver Function Tests:  Lab 08/04/11 1720  AST 17  ALT 23  ALKPHOS 60  BILITOT 0.6  PROT 6.8  ALBUMIN 3.8   CBC:  Lab 08/05/11 0328 08/04/11 1820 08/03/11 1402  WBC 7.4 9.1 7.9  HGB 12.3 14.3 13.9  HCT 37.3 41.8 42.1  MCV 83.3 81.5 82.9  PLT 210 212 215    Recent Results (from the past 240 hour(s))  CLOSTRIDIUM DIFFICILE BY PCR     Status: Abnormal   Collection Time   08/04/11 10:33 PM      Component Value Range Status Comment   C difficile by pcr POSITIVE (*) NEGATIVE Final      Studies: No results found.  Scheduled Meds:   . ciprofloxacin  400 mg Intravenous Q12H  . lisinopril  30 mg Oral Daily  . metronidazole  500 mg Intravenous Q8H  . potassium chloride  10  mEq Intravenous Q1 Hr x 3  . potassium chloride  40 mEq Oral Once   Continuous Infusions:   . dextrose 5 % and 0.9 % NaCl with KCl 40 mEq/L 125 mL/hr at 08/06/11 1810

## 2011-08-08 MED ORDER — SIMETHICONE 80 MG PO CHEW: 80.0000 mg | CHEWABLE_TABLET | Freq: Four times a day (QID) | ORAL | Status: DC | PRN

## 2011-08-08 MED ORDER — HYDROMORPHONE HCL 2 MG PO TABS: 2.0000 mg | ORAL_TABLET | Freq: Three times a day (TID) | ORAL | Status: AC | PRN

## 2011-08-08 MED ORDER — HYDROCODONE-ACETAMINOPHEN 5-500 MG PO TABS: 1.0000 | ORAL_TABLET | Freq: Four times a day (QID) | ORAL | Status: AC | PRN

## 2011-08-08 MED ORDER — METRONIDAZOLE 500 MG PO TABS: 500.0000 mg | ORAL_TABLET | Freq: Three times a day (TID) | ORAL | Status: AC

## 2011-08-08 MED ORDER — HYOSCYAMINE SULFATE 0.125 MG PO TABS: 0.1250 mg | ORAL_TABLET | ORAL | Status: DC | PRN

## 2011-08-08 MED ORDER — ZOLPIDEM TARTRATE 5 MG PO TABS: 5.0000 mg | ORAL_TABLET | Freq: Every evening | ORAL | Status: DC | PRN

## 2011-08-08 MED ORDER — CIPROFLOXACIN HCL 500 MG PO TABS: 500.0000 mg | ORAL_TABLET | Freq: Two times a day (BID) | ORAL | Status: AC

## 2011-08-08 NOTE — Progress Notes (Signed)
11:30 am Discharge instructions and Rx reviewed with patient. Patient states she understands instructions. Discharged to home with family.  Wynonia Lawman, RN

## 2011-08-08 NOTE — Discharge Summary (Signed)
Physician Discharge Summary  Kristina Ayala:[id number] DOB: 1945/06/08 DOA: 08/04/2011  PCP: Dorrene German, MD  Admit date: 08/04/2011 Discharge date: 08/08/2011  Discharge Condition: medically stable for discharge home today; patient agrees with discharge plan; RN also aware of patient's discharge today  Diet recommendation: regular diet or as tolerated  History of present illness:  Brief narrative:  66 year old female with PMHx of HTN who was admitted for persistent diarrhea and associated cramp like abdominal pain. Admitted for working diagnosis of C.diff colitis.  Assessment/Plan:   Principal Problem:  *C. difficile colitis  - patient started on cipro and flagyl  - patient reports diarrhea subsided to 5 BM a day - added Levsin and simethicone for cramps and flatulence respectively   Active Problems:  Nausea & vomiting  - antiemetics PRN  - patient reports nausea and vomiting resolved  - diet advanced to reglar   Dehydration  - secondary to infectious colitis  - advanced diet as tolerated   Hypokalemia  - secondary to GI losses, diarrhea  - supplemented  -potassium is 3.6 08/07/2011  Hypertension  - continue home medications   Code Status: full code  Family Communication: none at bedside  Disposition Plan: home today  Antibiotics:  1. Cipro 08/05/2011 --> continue for 10 days upon discharge 2. Flagyl 08/05/2011 --> continue for 10 days upon discharge   Discharge Exam: Filed Vitals:   08/08/11 0614  BP: 161/94  Pulse: 85  Temp: 98.9 F (37.2 C)  Resp: 18   Filed Vitals:   08/07/11 1300 08/07/11 2129 08/07/11 2237 08/08/11 0614  BP: 135/84  133/82 161/94  Pulse: 88 94 80 85  Temp: 99.7 F (37.6 C) 98.4 F (36.9 C)  98.9 F (37.2 C)  TempSrc:  Oral  Oral  Resp: 20 16  18   Height:      Weight:      SpO2: 99% 100% 98% 99%    General: Pt is alert, follows commands appropriately, not in acute distress Cardiovascular: Regular rate and  rhythm, S1/S2 +, no murmurs, no rubs, no gallops Respiratory: Clear to auscultation bilaterally, no wheezing, no crackles, no rhonchi Abdominal: Soft, non tender, non distended, bowel sounds +, no guarding Extremities: no edema, no cyanosis, pulses palpable bilaterally DP and PT Neuro: Grossly nonfocal  Discharge Instructions  Discharge Orders    Future Orders Please Complete By Expires   Diet - low sodium heart healthy      Increase activity slowly      Call MD for:  persistant nausea and vomiting      Call MD for:  severe uncontrolled pain      Call MD for:  difficulty breathing, headache or visual disturbances      Call MD for:  persistant dizziness or light-headedness        Medication List  As of 08/08/2011  9:23 AM   STOP taking these medications         diphenoxylate-atropine 2.5-0.025 MG per tablet         TAKE these medications         ciprofloxacin 500 MG tablet   Commonly known as: CIPRO   Take 1 tablet (500 mg total) by mouth 2 (two) times daily.      hydrochlorothiazide 25 MG tablet   Commonly known as: HYDRODIURIL   Take 25 mg by mouth daily.      HYDROcodone-acetaminophen 5-500 MG per tablet   Commonly known as: VICODIN   Take 1 tablet  by mouth every 6 (six) hours as needed for pain (for moderate pain).      HYDROmorphone 2 MG tablet   Commonly known as: DILAUDID   Take 1 tablet (2 mg total) by mouth every 8 (eight) hours as needed for pain (for severe pain).      hyoscyamine 0.125 MG tablet   Commonly known as: LEVSIN, ANASPAZ   Take 1 tablet (0.125 mg total) by mouth every 4 (four) hours as needed for cramping.      lisinopril 30 MG tablet   Commonly known as: PRINIVIL,ZESTRIL   Take 30 mg by mouth daily.      metroNIDAZOLE 500 MG tablet   Commonly known as: FLAGYL   Take 1 tablet (500 mg total) by mouth 3 (three) times daily.      simethicone 80 MG chewable tablet   Commonly known as: MYLICON   Chew 1 tablet (80 mg total) by mouth 4 (four)  times daily as needed for flatulence.      zolpidem 5 MG tablet   Commonly known as: AMBIEN   Take 1 tablet (5 mg total) by mouth at bedtime as needed for sleep (insomnia).           Follow-up Information    Follow up with AVBUERE,EDWIN A, MD in 1 week.   Contact information:   [address] [place] [address] [phone]       Schedule an appointment as soon as possible for a visit with [facility]. (If symptoms worsen as needed)    Contact information:   [address] [place] [address] [phone]          The results of significant diagnostics from this hospitalization (including imaging, microbiology, ancillary and laboratory) are listed below for reference.    Significant Diagnostic Studies: Ct Abdomen Pelvis W Contrast 08/03/2011  *RADIOLOGY REPORT*  Clinical Data: Abdominal pain and diarrhea  CT ABDOMEN AND PELVIS WITH CONTRAST  Technique:  Multidetector CT imaging of the abdomen and pelvis was performed following the standard protocol during bolus administration of intravenous contrast.  Contrast: OMNIPAQUE IOHEXOL 300 MG/ML  SOLN  Comparison: None.  Findings: 5 mm nodule at the right lung base (image #2).  No pericardial fluid.  No focal hepatic lesion.  The gallbladder, pancreas, spleen, adrenal glands, and kidneys are normal.  Stomach appears normal.  There is a high density lobular material within the stomach measuring 2.5 x 1.5 cm.  This may relate to ingested medication or  oral contrast.  The duodenum, small bowel, cecum, and appendix are normal.  There is mild bowel wall thickening of the ascending, transverse, and descending colon.  No free fluid in  the pelvis.  The bladder and uterus and ovaries are normal.  No pelvic lymphadenopathy.  No aggressive osseous lesions.  IMPRESSION:  1.  There is bowel wall thickening throughout the colon suggesting mild pancolitis.  Differential would include infectious colitis,  drug induced colitis, or inflammatory colitis. 2.  Normal appendix.  3.  A 5 mm nodule the right lung base.  If the patient is at high risk for bronchogenic carcinoma, follow-up chest CT at 6-12 months is recommended.  If the patient is at low risk for bronchogenic carcinoma, follow-up chest CT at 12 months is recommended.  This recommendation follows the consensus statement: Guidelines for Management of Small Pulmonary Nodules Detected on CT Scans: A Statement from the Fleischner Society as published in Radiology 2005; 237:395-400.  Original  Report Authenticated By: Genevive Bi, M.D.    Microbiology: Recent Results (from the past 240 hour(s))  CLOSTRIDIUM DIFFICILE BY PCR     Status: Abnormal   Collection Time   08/04/11 10:33 PM      Component Value Range Status Comment   C difficile by pcr POSITIVE (*) NEGATIVE Final      Labs: Basic Metabolic Panel:  Lab 08/07/11 4098 08/05/11 0328 08/04/11 1720 08/03/11 1402  NA 139 138 135 143  K 3.6 2.9* 3.0* 3.3*  CL 104 103 99 103  CO2 25 27 25 28   GLUCOSE 118* 126* 113* 87  BUN <3* 4* 6 10  CREATININE 0.68 0.77 0.63 0.67  CALCIUM 8.8 8.4 9.4 9.6  MG -- -- -- --  PHOS -- -- -- --   Liver Function Tests:  Lab 08/04/11 1720  AST 17  ALT 23  ALKPHOS 60  BILITOT 0.6  PROT 6.8  ALBUMIN 3.8   CBC:  Lab 08/07/11 0941 08/05/11 0328 08/04/11 1820 08/03/11 1402  WBC 7.2 7.4 9.1 7.9  NEUTROABS -- -- 6.9 4.4  HGB 12.5 12.3 14.3 13.9  HCT 37.8 37.3 41.8 42.1  MCV 82.0 83.3 81.5 82.9  PLT 230 210 212 215    Time coordinating discharge: Over 30 minutes  Signed:  Manson Passey, MD  [facility] 08/08/2011, 9:23 AM  Pager #: [phone]

## 2011-08-20 ENCOUNTER — Encounter: Payer: Self-pay | Admitting: Gastroenterology

## 2011-08-28 ENCOUNTER — Emergency Department (HOSPITAL_COMMUNITY)
Admission: EM | Admit: 2011-08-28 | Discharge: 2011-08-28 | Disposition: A | Discharge status: Home / Self Care | Payer: Medicare Other | Attending: Emergency Medicine | Admitting: Emergency Medicine

## 2011-08-28 ENCOUNTER — Encounter (HOSPITAL_COMMUNITY): Payer: Self-pay | Admitting: Emergency Medicine

## 2011-08-28 DIAGNOSIS — F17200 Nicotine dependence, unspecified, uncomplicated: Secondary | ICD-10-CM | POA: Insufficient documentation

## 2011-08-28 DIAGNOSIS — I10 Essential (primary) hypertension: Secondary | ICD-10-CM

## 2011-08-28 DIAGNOSIS — M7020 Olecranon bursitis, unspecified elbow: Secondary | ICD-10-CM | POA: Insufficient documentation

## 2011-08-28 DIAGNOSIS — M129 Arthropathy, unspecified: Secondary | ICD-10-CM | POA: Insufficient documentation

## 2011-08-28 DIAGNOSIS — Z809 Family history of malignant neoplasm, unspecified: Secondary | ICD-10-CM | POA: Insufficient documentation

## 2011-08-28 NOTE — ED Provider Notes (Signed)
I  reviewed the resident's note and I agree with the findings and plan.     Nelia Shi, MD 08/28/11 1055

## 2011-08-28 NOTE — ED Provider Notes (Signed)
History     CSN: [id number]  Arrival date & time 08/28/11  4098   First MD Initiated Contact with Patient 08/28/11 [phone]      Chief Complaint  Patient presents with  . Hypertension   HPI 66 yo female with h/o HTN, recent c-diff colitis who presents for consistently elevated blood pressure this last week. BP's at the drug store have been in the 170's/100's. She reports being compliant with her medication. Take HCTZ 25mg  and lisinopril 30mg . She was seen by her PCP last Monday and no adjustments in BP medications were made at the time. Denies any chest pain, shortness of breath, lower extremity swelling, headache or change in vision. No focal weakness.  Reports more stress recently.    Past Medical History  Diagnosis Date  . Hypertension   . Arthritis   . Bursitis     left elbow    Past Surgical History  Procedure Date  . No past surgeries     Family History  Problem Relation Age of Onset  . Diverticulosis Mother   . Cancer Mother   . Cancer Father     History  Substance Use Topics  . Smoking status: Current Some Day Smoker -- 0.5 packs/day for 50 years  . Smokeless tobacco: Never Used  . Alcohol Use: No    OB History    Grav Para Term Preterm Abortions TAB SAB Ect Mult Living                  Review of Systems  Constitutional: Negative for fever, chills and fatigue.  Eyes: Negative for visual disturbance.  Respiratory: Negative for cough, chest tightness and shortness of breath.   Cardiovascular: Negative for chest pain, palpitations and leg swelling.  All other systems reviewed and are negative.    Allergies  Doxycycline; Minocycline; and Penicillins  Home Medications   Current Outpatient Rx  Name Route Sig Dispense Refill  . CALCIUM + D PO Oral Take 1 tablet by mouth daily.    [name] HYDROCHLOROTHIAZIDE 25 MG PO TABS Oral Take 25 mg by mouth daily.      [name] LISINOPRIL 30 MG PO TABS Oral Take 30 mg by mouth daily.      [name] POTASSIUM GLUCONATE PO Oral Take 1  tablet by mouth daily.    [name] VITAMIN B-12 1000 MCG PO TABS Oral Take 1,000 mcg by mouth daily.      BP 140/92  Pulse 100  Temp 99.2 F (37.3 C) (Oral)  Resp 16  Wt 155 lb (70.308 kg)  SpO2 100% Repeat BP: 140/90 Physical Exam  Constitutional: She is oriented to person, place, and time. She appears well-developed. No distress.  HENT:  Head: Normocephalic and atraumatic.  Mouth/Throat: Oropharynx is clear and moist.  Eyes: Conjunctivae and EOM are normal. Pupils are equal, round, and reactive to light.  Neck: Normal range of motion. Neck supple.  Cardiovascular: Normal rate, regular rhythm and normal heart sounds.  Exam reveals no gallop and no friction rub.   No murmur heard. Pulmonary/Chest: Effort normal and breath sounds normal. No respiratory distress.  Abdominal: Soft. Bowel sounds are normal. She exhibits no distension. There is no tenderness.  Neurological: She is alert and oriented to person, place, and time. No cranial nerve deficit.  Skin: Skin is warm and dry. She is not diaphoretic.  Psychiatric: She has a normal mood and affect. Her behavior is normal.    ED Course  Procedures  None Labs Reviewed - No  data to display No results found.   No diagnosis found.    MDM  BP in the safe range. No evidence of end organ damage. Patient recommended to follow up with PCP next week. No medication adjustments made today.   Marena Chancy, PGY-2 [facility]        Lonia Skinner, MD 08/28/11 [phone]

## 2011-08-28 NOTE — ED Notes (Addendum)
Has been checking BP every day this week and it has been 170/100, had [facility] RN did home visit, and BP was high then too. Has tried to get into private MDs office, no appt yet.   Has been under some stress at home, has recently started on Welbutrin.

## 2011-09-01 ENCOUNTER — Emergency Department (HOSPITAL_COMMUNITY)
Admission: EM | Admit: 2011-09-01 | Discharge: 2011-09-02 | Disposition: A | Discharge status: Home / Self Care | Payer: Medicare Other | Attending: Emergency Medicine | Admitting: Emergency Medicine

## 2011-09-01 ENCOUNTER — Encounter (HOSPITAL_COMMUNITY): Payer: Self-pay | Admitting: Emergency Medicine

## 2011-09-01 DIAGNOSIS — M129 Arthropathy, unspecified: Secondary | ICD-10-CM | POA: Insufficient documentation

## 2011-09-01 DIAGNOSIS — R197 Diarrhea, unspecified: Secondary | ICD-10-CM

## 2011-09-01 DIAGNOSIS — A0472 Enterocolitis due to Clostridium difficile, not specified as recurrent: Secondary | ICD-10-CM

## 2011-09-01 DIAGNOSIS — R109 Unspecified abdominal pain: Secondary | ICD-10-CM | POA: Insufficient documentation

## 2011-09-01 DIAGNOSIS — F17200 Nicotine dependence, unspecified, uncomplicated: Secondary | ICD-10-CM | POA: Insufficient documentation

## 2011-09-01 DIAGNOSIS — I10 Essential (primary) hypertension: Secondary | ICD-10-CM | POA: Insufficient documentation

## 2011-09-01 NOTE — ED Notes (Signed)
Pt states that she was seen here recently and states that she has C diff. Pt states that she has had green diarrhea and lower abdominal pain for 2 days. Took vicodin before she came that brought pain from 10 to 2. Has had C diff for a month, she reports.

## 2011-09-02 LAB — COMPREHENSIVE METABOLIC PANEL
Albumin: 3.7 g/dL (ref 3.5–5.2)
BUN: 11 mg/dL (ref 6–23)
Calcium: 9.2 mg/dL (ref 8.4–10.5)
Chloride: 99 mEq/L (ref 96–112)
Creatinine, Ser: 0.71 mg/dL (ref 0.50–1.10)
Total Bilirubin: 0.4 mg/dL (ref 0.3–1.2)

## 2011-09-02 LAB — CBC WITH DIFFERENTIAL/PLATELET
Basophils Relative: 0 % (ref 0–1)
Eosinophils Absolute: 0.3 10*3/uL (ref 0.0–0.7)
Eosinophils Relative: 5 % (ref 0–5)
HCT: 41.3 % (ref 36.0–46.0)
Hemoglobin: 14.1 g/dL (ref 12.0–15.0)
MCH: 28 pg (ref 26.0–34.0)
MCHC: 34.1 g/dL (ref 30.0–36.0)
MCV: 82.1 fL (ref 78.0–100.0)
Monocytes Absolute: 0.9 10*3/uL (ref 0.1–1.0)
Monocytes Relative: 13 % — ABNORMAL HIGH (ref 3–12)
Neutro Abs: 3 10*3/uL (ref 1.7–7.7)
RDW: 14.6 % (ref 11.5–15.5)

## 2011-09-02 LAB — URINALYSIS, ROUTINE W REFLEX MICROSCOPIC
Glucose, UA: NEGATIVE mg/dL
Hgb urine dipstick: NEGATIVE
Ketones, ur: NEGATIVE mg/dL
Protein, ur: NEGATIVE mg/dL
pH: 5.5 (ref 5.0–8.0)

## 2011-09-02 LAB — URINE MICROSCOPIC-ADD ON

## 2011-09-02 MED ORDER — METRONIDAZOLE 500 MG PO TABS: 500.0000 mg | ORAL_TABLET | Freq: Three times a day (TID) | ORAL | Status: AC

## 2011-09-02 MED ORDER — ONDANSETRON HCL 4 MG/2ML IJ SOLN
4.0000 mg | Freq: Once | INTRAMUSCULAR | Status: AC
  Administered 2011-09-02: 4 mg via INTRAVENOUS
  Filled 2011-09-02: qty 2

## 2011-09-02 MED ORDER — KETOROLAC TROMETHAMINE 30 MG/ML IJ SOLN
30.0000 mg | Freq: Once | INTRAMUSCULAR | Status: AC
  Administered 2011-09-02: 30 mg via INTRAVENOUS
  Filled 2011-09-02: qty 1

## 2011-09-02 MED ORDER — CIPROFLOXACIN HCL 500 MG PO TABS: 500.0000 mg | ORAL_TABLET | Freq: Two times a day (BID) | ORAL | Status: AC

## 2011-09-02 MED ORDER — SODIUM CHLORIDE 0.9 % IV SOLN
Freq: Once | INTRAVENOUS | Status: AC
  Administered 2011-09-02: 03:00:00 via INTRAVENOUS

## 2011-09-02 NOTE — ED Provider Notes (Signed)
History     CSN: [id number]  Arrival date & time 09/01/11  2255   First MD Initiated Contact with Patient 09/02/11 0256      Chief Complaint  Patient presents with  . Abdominal Pain    (Consider location/radiation/quality/duration/timing/severity/associated sxs/prior treatment) HPI Comments: Was recently diagnosed with C-Diff and this feels the same.  Patient is a 66 y.o. female presenting with abdominal pain. The history is provided by the patient.  Abdominal Pain The primary symptoms of the illness include abdominal pain, fatigue and nausea. The primary symptoms of the illness do not include fever, vomiting, diarrhea or dysuria. The current episode started 2 days ago.    Past Medical History  Diagnosis Date  . Hypertension   . Arthritis   . Bursitis     left elbow    Past Surgical History  Procedure Date  . No past surgeries     Family History  Problem Relation Age of Onset  . Diverticulosis Mother   . Cancer Mother   . Cancer Father     History  Substance Use Topics  . Smoking status: Current Some Day Smoker -- 0.5 packs/day for 50 years  . Smokeless tobacco: Never Used  . Alcohol Use: No    OB History    Grav Para Term Preterm Abortions TAB SAB Ect Mult Living                  Review of Systems  Constitutional: Positive for fatigue. Negative for fever.  Gastrointestinal: Positive for nausea and abdominal pain. Negative for vomiting and diarrhea.  Genitourinary: Negative for dysuria.  All other systems reviewed and are negative.    Allergies  Doxycycline; Minocycline; and Penicillins  Home Medications   Current Outpatient Rx  Name Route Sig Dispense Refill  . CALCIUM + D PO Oral Take 1 tablet by mouth daily.    [name] HYDROCHLOROTHIAZIDE 25 MG PO TABS Oral Take 25 mg by mouth daily.      [name] HYDROCODONE-ACETAMINOPHEN 5-325 MG PO TABS Oral Take 1 tablet by mouth every 6 (six) hours as needed. Pain    . LISINOPRIL 30 MG PO TABS Oral Take 30 mg by  mouth daily.      [name] POTASSIUM GLUCONATE PO Oral Take 1 tablet by mouth daily.    [name] VITAMIN B-12 1000 MCG PO TABS Oral Take 1,000 mcg by mouth daily.      BP 123/66  Pulse 83  Temp 97.9 F (36.6 C) (Oral)  Resp 16  SpO2 99%  Physical Exam  Nursing note and vitals reviewed. Constitutional: She is oriented to person, place, and time. She appears well-developed and well-nourished. No distress.  HENT:  Head: Normocephalic and atraumatic.  Neck: Normal range of motion. Neck supple.  Cardiovascular: Normal rate and regular rhythm.  Exam reveals no gallop and no friction rub.   No murmur heard. Pulmonary/Chest: Effort normal and breath sounds normal. No respiratory distress. She has no wheezes.  Abdominal: Soft. Bowel sounds are normal. She exhibits no distension. There is no tenderness.  Musculoskeletal: Normal range of motion.  Neurological: She is alert and oriented to person, place, and time.  Skin: Skin is warm and dry. She is not diaphoretic.    ED Course  Procedures (including critical care time)  Labs Reviewed  URINALYSIS, ROUTINE W REFLEX MICROSCOPIC - Abnormal; Notable for the following:    Color, Urine AMBER (*)  BIOCHEMICALS MAY BE AFFECTED BY COLOR   APPearance CLOUDY (*)  Specific Gravity, Urine 1.036 (*)     Bilirubin Urine SMALL (*)     Leukocytes, UA SMALL (*)     All other components within normal limits  CBC WITH DIFFERENTIAL - Abnormal; Notable for the following:    Monocytes Relative 13 (*)     All other components within normal limits  COMPREHENSIVE METABOLIC PANEL - Abnormal; Notable for the following:    Potassium 3.1 (*)     Glucose, Bld 120 (*)     ALT 36 (*)     GFR calc non Af Amer 87 (*)     All other components within normal limits  URINE MICROSCOPIC-ADD ON - Abnormal; Notable for the following:    Bacteria, UA FEW (*)     All other components within normal limits  STOOL CULTURE  CLOSTRIDIUM DIFFICILE BY PCR  GI PATHOGEN PANEL BY PCR, STOOL    No results found.   No diagnosis found.    MDM  Likely a recurrence of c-diff.  Will treat with cipro and flagyl.  She is to return prn if her symptoms worsen or change.        Geoffery Lyons, MD 09/02/11 [phone]

## 2011-09-21 ENCOUNTER — Ambulatory Visit (INDEPENDENT_AMBULATORY_CARE_PROVIDER_SITE_OTHER): Payer: Medicare Other | Admitting: Gastroenterology

## 2011-09-21 ENCOUNTER — Encounter: Payer: Self-pay | Admitting: Gastroenterology

## 2011-09-21 VITALS — BP 124/82 | HR 104 | Ht 64.5 in | Wt 149.5 lb

## 2011-09-21 DIAGNOSIS — A0472 Enterocolitis due to Clostridium difficile, not specified as recurrent: Secondary | ICD-10-CM

## 2011-09-21 DIAGNOSIS — Z86010 Personal history of colonic polyps: Secondary | ICD-10-CM

## 2011-09-21 NOTE — Progress Notes (Signed)
HPI: This is a   pleasant 66 year old woman whom I am meeting for the first time today  CT scan abdomen and pelvis July 2013 found that the colon was thick throughout colon. Clostridium difficile PCR test positive July 2013.  She was diagnosed with Clostridium difficile associated colitis, diarrhea July 2013. She was put on Flagyl as well as ciprofloxacin (not sure why this Cipro was included) for about a two-week course.  Hospitalized for 3-4 nights, was having diarrhea, bloating.  No preceeding antibiotics.  She had been out to lunch at [facility] a week or two prior her illness and had a salad. She started having diarrhea during that meal and thinks that it was when she caught.  She completed 10 days of cipro/flagyl. Diarrhea never really improved.  She actually went back to the ER three weeks ago, put back on another 10 day course of both antibiotics.  Now bowels are getting more firm.  Has never seen any blood in her stool.  Her "big question now is why is she losing so much weight."  She has lost about 11-12 pounds in a 2 month time period.    She had a colonoscopy with Dr. Loreta Ave in 04/2007, some small polyps were removed and one was a TA.   Review of systems: Pertinent positive and negative review of systems were noted in the above HPI section. Complete review of systems was performed and was otherwise normal.    Past Medical History  Diagnosis Date  . Hypertension   . Arthritis   . Bursitis     left elbow  . Infectious colitis   . Clostridium difficile infection   . HLD (hyperlipidemia)     Past Surgical History  Procedure Date  . Tonsillectomy     Current Outpatient Prescriptions  Medication Sig Dispense Refill  . buPROPion (WELLBUTRIN XL) 150 MG 24 hr tablet Take 150 mg by mouth daily.      . Calcium Carbonate-Vitamin D (CALCIUM + D PO) Take 1 tablet by mouth daily.      . hydrochlorothiazide (HYDRODIURIL) 25 MG tablet Take 25 mg by mouth daily.        [name]  lisinopril (PRINIVIL,ZESTRIL) 40 MG tablet Take 40 mg by mouth daily.      . Magnesium 500 MG CAPS Take 1 capsule by mouth daily.      [name] POTASSIUM GLUCONATE PO Take 1 tablet by mouth daily.      . vitamin B-12 (CYANOCOBALAMIN) 1000 MCG tablet Take 1,000 mcg by mouth daily.        Allergies as of 09/21/2011 - Review Complete 09/21/2011  Allergen Reaction Noted  . Doxycycline Diarrhea 08/03/2011  . Minocycline Diarrhea 08/03/2011  . Penicillins Rash 12/18/2010    Family History  Problem Relation Age of Onset  . Diverticulosis Mother   . Ovarian cancer Mother   . Pancreatic cancer Father   . Colon polyps Sister     History   Social History  . Marital Status: Single    Spouse Name: N/A    Number of Children: 2  . Years of Education: N/A   Occupational History  . RETIRED    Social History Main Topics  . Smoking status: Current Some Day Smoker -- 0.5 packs/day for 50 years    Types: Cigarettes  . Smokeless tobacco: Never Used  . Alcohol Use: No  . Drug Use: No  . Sexually Active: Not on file   Other Topics Concern  . Not  on file   Social History Narrative  . No narrative on file       Physical Exam: BP 124/82  Pulse 104  Ht 5' 4.5" (1.638 m)  Wt 149 lb 8 oz (67.813 kg)  BMI 25.27 kg/m2 Constitutional: generally well-appearing Psychiatric: alert and oriented x3 Eyes: extraocular movements intact Mouth: oral pharynx moist, no lesions Neck: supple no lymphadenopathy Cardiovascular: heart regular rate and rhythm Lungs: clear to auscultation bilaterally Abdomen: soft, nontender, nondistended, no obvious ascites, no peritoneal signs, normal bowel sounds Extremities: no lower extremity edema bilaterally Skin: no lesions on visible extremities    Assessment and plan: 66 y.o. female with  recent C. difficile infection, weight loss, personal history of adenomatous colon polyp  First, I explained to her that if she has proven C. difficile infection again she  should be treated with Flagyl alone, it is not clear to me why she was treated with Flagyl and Cipro on both occasions this past month or 2. She knows to call here if she has acute diarrhea again. Fortunately her GI symptoms have really improved. She had an adenomatous colon polyp removed by Dr. Rickard Rhymes 2009 and she is not interested in following back up with her. We will take over her polyp surveillance here and I have put her in for recall colonoscopy in 2014.  I recommended that she get back on her regular diet. She is very concerned with the weight loss she has suffered over the past month or 2. I suspect is related to her acute diarrheal illness. She knows to get back in touch with me if the weight loss continues.

## 2011-09-21 NOTE — Patient Instructions (Addendum)
One of your biggest health concerns is your smoking.  This increases your risk for most cancers and serious cardiovascular diseases such as strokes, heart attacks.  You should try your best to stop.  If you need assistance, please contact your PCP or Smoking Cessation Class at [facility] [phone]) or [facility] Quit-Line (1-800-QUIT-NOW). Since your GI symptoms have improved, no need for repeat colonoscopy now. If you have repeat episode of C. Difficile diarrhea, then you should only be on flagyl (not on cipro as well since cipro can CAUSE c. Diff infection). Recall colonoscopy in 04/2012 for history of adenomatous polyps (2009 Dr. Loreta Ave colonoscopy). You should be on a regular diet now. A copy of this information will be made available to Dr. Concepcion Elk.

## 2011-11-11 ENCOUNTER — Emergency Department (HOSPITAL_COMMUNITY)
Admission: EM | Admit: 2011-11-11 | Discharge: 2011-11-11 | Disposition: A | Discharge status: Home / Self Care | Payer: Medicare Other | Attending: Emergency Medicine | Admitting: Emergency Medicine

## 2011-11-11 ENCOUNTER — Emergency Department (HOSPITAL_COMMUNITY): Payer: Medicare Other

## 2011-11-11 ENCOUNTER — Encounter (HOSPITAL_COMMUNITY): Payer: Self-pay | Admitting: *Deleted

## 2011-11-11 DIAGNOSIS — E876 Hypokalemia: Secondary | ICD-10-CM

## 2011-11-11 DIAGNOSIS — I10 Essential (primary) hypertension: Secondary | ICD-10-CM | POA: Insufficient documentation

## 2011-11-11 DIAGNOSIS — R079 Chest pain, unspecified: Secondary | ICD-10-CM | POA: Insufficient documentation

## 2011-11-11 DIAGNOSIS — M549 Dorsalgia, unspecified: Secondary | ICD-10-CM | POA: Insufficient documentation

## 2011-11-11 DIAGNOSIS — Z79899 Other long term (current) drug therapy: Secondary | ICD-10-CM | POA: Insufficient documentation

## 2011-11-11 LAB — CBC
HCT: 41.3 % (ref 36.0–46.0)
MCH: 27.8 pg (ref 26.0–34.0)
MCV: 81.3 fL (ref 78.0–100.0)
Platelets: 238 10*3/uL (ref 150–400)
RBC: 5.08 MIL/uL (ref 3.87–5.11)

## 2011-11-11 LAB — COMPREHENSIVE METABOLIC PANEL
AST: 22 U/L (ref 0–37)
BUN: 14 mg/dL (ref 6–23)
CO2: 27 mEq/L (ref 19–32)
Calcium: 9.3 mg/dL (ref 8.4–10.5)
Creatinine, Ser: 0.73 mg/dL (ref 0.50–1.10)
GFR calc Af Amer: >90 mL/min (ref >90)
GFR calc non Af Amer: 86 mL/min — ABNORMAL LOW (ref >90)

## 2011-11-11 LAB — TROPONIN I: Troponin I: <0.3 ng/mL (ref <0.30)

## 2011-11-11 LAB — D-DIMER, QUANTITATIVE: D-Dimer, Quant: 1 ug/mL-FEU — ABNORMAL HIGH (ref 0.00–0.48)

## 2011-11-11 MED ORDER — POTASSIUM CHLORIDE CRYS ER 20 MEQ PO TBCR
20.0000 meq | EXTENDED_RELEASE_TABLET | Freq: Once | ORAL | Status: AC
  Administered 2011-11-11: 20 meq via ORAL
  Filled 2011-11-11: qty 1

## 2011-11-11 NOTE — ED Notes (Signed)
Dr. Bernette Mayers at bedside, inserted 20g IV to right upper arm.

## 2011-11-11 NOTE — ED Notes (Signed)
Pt reports intermittent L side mid back pain that radiates to her L chest since Sunday.  Pt reports in July she was told that they found a mass in her L lung when she had a CT done.  Pt also reports SOB with exertion.  Denies noticing any swelling in her ankles.

## 2011-11-11 NOTE — ED Notes (Signed)
Pt returned from X-ray.  

## 2011-11-11 NOTE — ED Provider Notes (Signed)
History     CSN: [id number]  Arrival date & time 11/11/11  1313   First MD Initiated Contact with Patient 11/11/11 1401      Chief Complaint  Patient presents with  . Back Pain  . Chest Pain    (Consider location/radiation/quality/duration/timing/severity/associated sxs/prior treatment) Patient is a 66 y.o. female presenting with back pain and chest pain. The history is provided by the patient.  Back Pain  Associated symptoms include chest pain. Pertinent negatives include no fever, no headaches and no abdominal pain.  Chest Pain Pertinent negatives for primary symptoms include no fever, no shortness of breath, no palpitations, no abdominal pain, no nausea and no vomiting.   pt c/o left upper back and left cp for past few days. Constant. Occasionally worse w lying on left side/shoulder. No relation to whether upright or supine. No change w activity or exertion. Not pleuritic pain. Denies cough or uri c/o. No fever or chills. No associated nv or diaphoresis. No rash. No chest/back injury or strain. No leg pain or swelling. No hx dvt or pe. No immobility/travel, or surgery. No estrogen use. +smoker. No radicular/arm pain. No numbness/weakness.      Past Medical History  Diagnosis Date  . Hypertension   . Arthritis   . Bursitis     left elbow  . Infectious colitis   . Clostridium difficile infection   . HLD (hyperlipidemia)     Past Surgical History  Procedure Date  . Tonsillectomy     Family History  Problem Relation Age of Onset  . Diverticulosis Mother   . Ovarian cancer Mother   . Pancreatic cancer Father   . Colon polyps Sister     History  Substance Use Topics  . Smoking status: Current Some Day Smoker -- 0.5 packs/day for 50 years    Types: Cigarettes  . Smokeless tobacco: Never Used  . Alcohol Use: No    OB History    Grav Para Term Preterm Abortions TAB SAB Ect Mult Living                  Review of Systems  Constitutional: Negative for fever  and chills.  HENT: Negative for neck pain.   Eyes: Negative for redness.  Respiratory: Negative for shortness of breath.   Cardiovascular: Positive for chest pain. Negative for palpitations and leg swelling.  Gastrointestinal: Negative for nausea, vomiting and abdominal pain.  Genitourinary: Negative for flank pain.  Musculoskeletal: Positive for back pain.  Skin: Negative for rash.  Neurological: Negative for headaches.  Hematological: Does not bruise/bleed easily.  Psychiatric/Behavioral: Negative for confusion.    Allergies  Doxycycline; Minocycline; and Penicillins  Home Medications   Current Outpatient Rx  Name Route Sig Dispense Refill  . BUPROPION HCL ER (XL) 150 MG PO TB24 Oral Take 150 mg by mouth daily.    [name] CALCIUM + D PO Oral Take 1 tablet by mouth daily.    [name] HYDROCHLOROTHIAZIDE 25 MG PO TABS Oral Take 25 mg by mouth daily.      [name] LISINOPRIL 40 MG PO TABS Oral Take 40 mg by mouth daily.    [name] MAGNESIUM 500 MG PO CAPS Oral Take 1 capsule by mouth daily.    [name] POTASSIUM GLUCONATE PO Oral Take 1 tablet by mouth daily.    [name] VITAMIN B-12 1000 MCG PO TABS Oral Take 1,000 mcg by mouth daily.      BP 159/92  Pulse 95  Temp 98.6 F (37 C) (Oral)  Resp 20  SpO2 99%  Physical Exam  Nursing note and vitals reviewed. Constitutional: She is oriented to person, place, and time. She appears well-developed and well-nourished. No distress.  HENT:  Mouth/Throat: Oropharynx is clear and moist.  Eyes: Conjunctivae normal are normal. No scleral icterus.  Neck: Neck supple. No tracheal deviation present.  Cardiovascular: Normal rate, regular rhythm, normal heart sounds and intact distal pulses.  Exam reveals no gallop and no friction rub.   No murmur heard. Pulmonary/Chest: Effort normal and breath sounds normal. No respiratory distress. She exhibits no tenderness.       No breast or axillary mass felt.   Abdominal: Soft. Normal appearance and bowel sounds are normal. She exhibits  no distension and no mass. There is no tenderness. There is no rebound and no guarding.  Genitourinary:       No cva tenderness  Musculoskeletal: She exhibits no edema.       CT spine non tender, aligned, no step off. Left upper back muscular tenderness.   Neurological: She is alert and oriented to person, place, and time.  Skin: Skin is warm and dry. No rash noted.       No rash/shingles in area of pain  Psychiatric: She has a normal mood and affect.    ED Course  Procedures (including critical care time)   Labs Reviewed  CBC  COMPREHENSIVE METABOLIC PANEL  TROPONIN I  D-DIMER, QUANTITATIVE   Results for orders placed during the hospital encounter of 11/11/11  CBC      Component Value Range   WBC 5.4  4.0 - 10.5 K/uL   RBC 5.08  3.87 - 5.11 MIL/uL   Hemoglobin 14.1  12.0 - 15.0 g/dL   HCT 40.9  81.1 - 91.4 %   MCV 81.3  78.0 - 100.0 fL   MCH 27.8  26.0 - 34.0 pg   MCHC 34.1  30.0 - 36.0 g/dL   RDW 78.2  95.6 - 21.3 %   Platelets 238  150 - 400 K/uL  COMPREHENSIVE METABOLIC PANEL      Component Value Range   Sodium 139  135 - 145 mEq/L   Potassium 3.2 (*) 3.5 - 5.1 mEq/L   Chloride 102  96 - 112 mEq/L   CO2 27  19 - 32 mEq/L   Glucose, Bld 81  70 - 99 mg/dL   BUN 14  6 - 23 mg/dL   Creatinine, Ser 0.86  0.50 - 1.10 mg/dL   Calcium 9.3  8.4 - 57.8 mg/dL   Total Protein 6.7  6.0 - 8.3 g/dL   Albumin 3.7  3.5 - 5.2 g/dL   AST 22  0 - 37 U/L   ALT 31  0 - 35 U/L   Alkaline Phosphatase 78  39 - 117 U/L   Total Bilirubin 0.2 (*) 0.3 - 1.2 mg/dL   GFR calc non Af Amer 86 (*) >90 mL/min   GFR calc Af Amer >90  >90 mL/min  TROPONIN I      Component Value Range   Troponin I <0.30  <0.30 ng/mL  D-DIMER, QUANTITATIVE      Component Value Range   D-Dimer, Quant 1.00 (*) 0.00 - 0.48 ug/mL-FEU   Dg Chest 2 View  11/11/2011  *RADIOLOGY REPORT*  Clinical Data: Shortness of breath, left-sided chest pain.  CHEST - 2 VIEW  Comparison: 05/19/2007  Findings: Heart and  mediastinal contours are within normal limits. No focal opacities or effusions.  No acute  bony abnormality.  IMPRESSION: No active cardiopulmonary disease.   Original Report Authenticated By: Cyndie Chime, M.D.        MDM  Monitor. Ecg. Cxr. Labs.  Reviewed nursing notes and prior charts for additional history.    Date: 11/11/2011  Rate: 91  Rhythm: normal sinus rhythm  QRS Axis: normal  Intervals: normal  ST/T Wave abnormalities: normal  Conduction Disutrbances:none  Narrative Interpretation:   Old EKG Reviewed: none available  ddimer elevated.   will get ct r/o pe, reassess prior noted rll nodule.    Signed out to Dr Bernette Mayers to check CT. If neg for PE, may d/c. Also if lung nodule/?mass, will need outpt follow up for that as well.          Suzi Roots, MD 11/11/11 [phone]

## 2011-11-11 NOTE — ED Notes (Signed)
Attempted x2 to establish IV access. Unsuccessful.  MD notified.  IV team paged.

## 2011-11-11 NOTE — ED Notes (Signed)
Dr. Bernette Mayers notified that IV team unable to obtain access.

## 2011-11-11 NOTE — ED Notes (Signed)
Secretary Clydie Braun) paged IV team.

## 2011-11-11 NOTE — ED Provider Notes (Signed)
Pt awaiting CTA, difficult to obtain IV access after multiple sticks. Nursing has asked for my assistance with US guided.   Angiocath insertion Performed by: Pollyann Savoy.  Consent: Verbal consent obtained. Risks and benefits: risks, benefits and alternatives were discussed Time out: Immediately prior to procedure a "time out" was called to verify the correct patient, procedure, equipment, support staff and site/side marked as required.  Preparation: Patient was prepped and draped in the usual sterile fashion.  Vein Location: R basilic vein  Ultrasound Guided  Gauge: 20ga  Normal blood return and flush without difficulty Patient tolerance: Patient tolerated the procedure well with no immediate complications.   8:08 PM IV infiltrated during CTA. Pt refuses any additional ED attempts at IV access. I offered inpatient evaluation of her CP, Lovenox and further IV access attempts by IV team but she wants to go home. Advised to return to the ED for any additional CP, SOB or for any other concerns.   Kristina Barletta B. Bernette Mayers, MD 11/11/11 2009

## 2011-11-12 ENCOUNTER — Ambulatory Visit (INDEPENDENT_AMBULATORY_CARE_PROVIDER_SITE_OTHER): Payer: Medicare Other | Admitting: Cardiology

## 2011-11-12 ENCOUNTER — Encounter: Payer: Self-pay | Admitting: Cardiology

## 2011-11-12 VITALS — BP 164/95 | HR 111 | Ht 65.0 in | Wt 157.0 lb

## 2011-11-12 DIAGNOSIS — I10 Essential (primary) hypertension: Secondary | ICD-10-CM

## 2011-11-12 DIAGNOSIS — R079 Chest pain, unspecified: Secondary | ICD-10-CM

## 2011-11-12 DIAGNOSIS — R0789 Other chest pain: Secondary | ICD-10-CM | POA: Insufficient documentation

## 2011-11-12 NOTE — Progress Notes (Signed)
HPI The patient has no prior cardiac history.  She did have a stress test she reports many years ago.  She was in the emergency room earlier today with chest discomfort. She says this started on Monday. He has been pain in her upper back and radiating around to her left breast. His been having this kind of discomfort more in the morning when she wakes up. On Wednesday she had some severe pain while taking a drink of a cold drink.  On Thursday when she woke with pain that was less severe but still 7/10 when she moves a certain way she decided to present to the emergency room. I reviewed these records. She had poor anterior R wave progression but no obvious acute ST segment changes. Her enzymes were negative. She was felt not to be having a myocardial infarction. However, a d-dimer was slightly elevated. The plan was for a CT scan. However, after multiple failed attempts they could not get IV access.  She was subsequently sent home and it was suggested that she have followup here. She had a little more discomfort today but took Aleve and it seems to be improved. She has been doing activities such as water aerobics. She hasn't done this in a couple of weeks but this has not been bringing on any discomfort. She's not describing any new shortness of breath, PND or orthopnea. She's not been having any new palpitations, presyncope or syncope. She has no PND or orthopnea. She has no weight gain or edema. Of note in the emergency room they tried to give her some ice water and she had some recurrent chest discomfort. Her potassium was low in the emergency room and this was supplemented.  Allergies  Allergen Reactions  . Doxycycline Diarrhea  . Minocycline Diarrhea  . Penicillins Rash    Current Outpatient Prescriptions  Medication Sig Dispense Refill  . buPROPion (WELLBUTRIN XL) 150 MG 24 hr tablet Take 150 mg by mouth daily.      . Calcium Carbonate-Vitamin D (CALCIUM + D PO) Take 1 tablet by mouth daily.       . hydrochlorothiazide (HYDRODIURIL) 25 MG tablet Take 25 mg by mouth daily.        [name] lisinopril (PRINIVIL,ZESTRIL) 40 MG tablet Take 40 mg by mouth daily.      . Magnesium 500 MG CAPS Take 1 capsule by mouth daily.      [name] POTASSIUM GLUCONATE PO Take 1 tablet by mouth daily.      . vitamin B-12 (CYANOCOBALAMIN) 1000 MCG tablet Take 1,000 mcg by mouth daily.        Past Medical History  Diagnosis Date  . Hypertension   . Arthritis   . Bursitis     left elbow  . Infectious colitis   . Clostridium difficile infection   . HLD (hyperlipidemia)     Past Surgical History  Procedure Date  . Tonsillectomy     Family History  Problem Relation Age of Onset  . Diverticulosis Mother   . Ovarian cancer Mother   . Pancreatic cancer Father   . Colon polyps Sister     History   Social History  . Marital Status: Single    Spouse Name: N/A    Number of Children: 2  . Years of Education: N/A   Occupational History  . RETIRED    Social History Main Topics  . Smoking status: Current Some Day Smoker -- 0.2 packs/day for 50 years    Types:  Cigarettes  . Smokeless tobacco: Never Used   Comment: 2 cigarettes daily  . Alcohol Use: No  . Drug Use: No  . Sexually Active: Not on file   Other Topics Concern  . Not on file   Social History Narrative  . No narrative on file    ROS:  As stated in the HPI and negative for all other systems.   PHYSICAL EXAM BP 164/95  Pulse 111  Ht 5\' 5"  (1.651 m)  Wt 157 lb (71.215 kg)  BMI 26.13 kg/m2 GENERAL:  Well appearing HEENT:  Pupils equal round and reactive, fundi not visualized, oral mucosa unremarkable NECK:  No jugular venous distention, waveform within normal limits, carotid upstroke brisk and symmetric, no bruits, no thyromegaly LYMPHATICS:  No cervical, inguinal adenopathy LUNGS:  Clear to auscultation bilaterally BACK:  No CVA tenderness CHEST:  Unremarkable HEART:  PMI not displaced or sustained,S1 and S2 within normal  limits, no S3, no S4, no clicks, no rubs, no murmurs ABD:  Flat, positive bowel sounds normal in frequency in pitch, no bruits, no rebound, no guarding, no midline pulsatile mass, no hepatomegaly, no splenomegaly EXT:  2 plus pulses throughout, no edema, no cyanosis no clubbing SKIN:  No rashes no nodules NEURO:  Cranial nerves II through XII grossly intact, motor grossly intact throughout [facility]:  Cognitively intact, oriented to person place and time  EKG:  11/12/2011  normal sinus rhythm, rate 91, axis within normal limits, intervals within normal limits, no acute ST-T wave changes.  ASSESSMENT AND PLAN  Chest pain - Her chest pain is very atypical for angina. Enzymes were negative. It is improved with Aleve. There is appears to be a musculoskeletal component. There is also a GI component exacerbated by cold beverage. I think the pretest probability of obstructive coronary disease as an etiology is quite low. In addition the pretest probability of pulmonary embolism is low. She has no significant risk factors. At this point I will bring her back for an exercise treadmill test. I do not think that extraordinary measures such as hospitalization for central line placement followed by CT scan or V/Q is justified given the low probability of pulmonary embolism and improvement in symptoms. However, if she has increasing symptoms she should re\re presented to the emergency room.  Hypertension - Her blood pressure is slightly elevated today. Previous readings have been normal and she thinks it's normal most of the time. I encouraged her to get blood pressure diary.  Hypokalemia - She was given instructions by the ER physicians to increase her potassium. She did get supplemental potassium in the ER.

## 2011-11-12 NOTE — Patient Instructions (Addendum)
Your physician has requested that you have an exercise tolerance test Thursday October 24,2013 at 8:30am. Please arrive 15 minutes early to check in.   For further information please visit [email or web address] Please also follow instruction sheet, as given.  Your physician recommends that you continue on your current medications as directed. Please refer to the Current Medication list given to you today.

## 2011-11-15 ENCOUNTER — Encounter (HOSPITAL_COMMUNITY): Payer: Medicare Other

## 2011-11-15 ENCOUNTER — Ambulatory Visit (INDEPENDENT_AMBULATORY_CARE_PROVIDER_SITE_OTHER): Payer: Medicare Other | Admitting: Cardiology

## 2011-11-15 DIAGNOSIS — R079 Chest pain, unspecified: Secondary | ICD-10-CM

## 2011-11-15 NOTE — Progress Notes (Deleted)
Exercise Treadmill Test  Pre-Exercise Testing Evaluation Rhythm: {CHL RHYTHM BASELINE EKG FOR RUE:45409811}  Rate: {CHL RATE BASELINE EKG FOR ETT:21021048}   PR:  {CHL PR BASELINE EKG FOR ETT:21021049} QRS:  {CHL QRS BASELINE EKG FOR ETT:21021050}  QT:  {CHL QT BASELINE EKG FOR ETT:21021051} QTc: {CHL QTC BASELINE EKG FOR ETT:21021052}   P axis: {CHL AXIS BASELINE EKG FOR ETT:21021053}  QRS axis:  {CHL AXIS BASELINE EKG FOR ETT:21021053}  ST Segments:  {CHL ST SEGMENTS BASELINE EKG FOR BJY:78295621}     Test  Exercise Tolerance Test Ordering MD: Angelina Sheriff, MD  Interpreting MD: Angelina Sheriff, MD  Unique Test No: 1  Treadmill: 1  Indication for ETT: chest pain - rule out ischemia  Contraindication to ETT: No   Stress Modality: exercise - treadmill  Cardiac Imaging Performed: non   Protocol: standard Bruce - maximal  Max BP:  ***/***  Max MPHR (bpm):  *** 85% MPR (bpm):  ***  MPHR obtained (bpm):  *** % MPHR obtained:  ***  Reached 85% MPHR (min:sec):  *** Total Exercise Time (min-sec):  ***  Workload in METS:  *** Borg Scale: ***  Reason ETT Terminated:  {CHL REASON TERMINATED FOR HYQ:65784696}    ST Segment Analysis At Rest: {CHL ST SEGMENT AT REST FOR EXB:28413244} With Exercise: {CHL ST SEGMENT WITH EXERCISE FOR WNU:27253664}  Other Information Arrhythmia:  {CHL ARRHYTHMIA FOR QIH:47425956} Angina during ETT:  {CHL ANGINA DURING LOV:56433295} Quality of ETT:  {CHL QUALITY OF JOA:41660630}  ETT Interpretation:  {CHL INTERPRETATION FOR ZSW:10932355}  Comments: ***  Recommendations: ***

## 2011-11-18 ENCOUNTER — Ambulatory Visit (INDEPENDENT_AMBULATORY_CARE_PROVIDER_SITE_OTHER): Payer: Medicare Other | Admitting: Physician Assistant

## 2011-11-18 ENCOUNTER — Encounter: Payer: Medicare Other | Admitting: Cardiology

## 2011-11-18 ENCOUNTER — Encounter (HOSPITAL_COMMUNITY): Payer: Medicare Other

## 2011-11-18 DIAGNOSIS — R079 Chest pain, unspecified: Secondary | ICD-10-CM

## 2011-11-18 DIAGNOSIS — I10 Essential (primary) hypertension: Secondary | ICD-10-CM

## 2011-11-18 MED ORDER — AMLODIPINE BESYLATE 5 MG PO TABS: 5.0000 mg | ORAL_TABLET | Freq: Every day | ORAL | Status: DC

## 2011-11-18 NOTE — Patient Instructions (Addendum)
Your physician recommends that you schedule a follow-up appointment in: 12/02/11 @ 9:50 WITH [place], [facility]

## 2011-11-18 NOTE — Progress Notes (Signed)
Exercise Treadmill Test  Pre-Exercise Testing Evaluation Rhythm: normal sinus  Rate: 92   PR:  .19 QRS:  .07  QT:  .36 QTc: .44            Test  Exercise Tolerance Test Ordering MD: Angelina Sheriff, MD  Interpreting MD: Tereso Newcomer, [facility]  Unique Test No: 1  Treadmill:  1  Indication for ETT: chest pain - rule out ischemia  Contraindication to ETT: No   Stress Modality: exercise - treadmill  Cardiac Imaging Performed: non   Protocol: standard Bruce - maximal  Max BP:  227/119  Max MPHR (bpm):  153 85% MPR (bpm):  130  MPHR obtained (bpm):  146 % MPHR obtained:  95%  Reached 85% MPHR (min:sec):  2:30 Total Exercise Time (min-sec):  4:31  Workload in METS:  6.4 Borg Scale: 13  Reason ETT Terminated:  exaggerated hypertensive response    ST Segment Analysis At Rest: normal ST segments - no evidence of significant ST depression With Exercise: no evidence of significant ST depression  Other Information Arrhythmia:  No Angina during ETT:  absent (0) Quality of ETT:  diagnostic  ETT Interpretation:  normal - no evidence of ischemia by ST analysis  Comments: Fair exercise tolerance. No chest pain. Hypertensive BP response to exercise.  Baseline BP elevated as patient did not take BP meds this AM. No ST-T changes to suggest ischemia.   Recommendations: Patient was observed to make sure her BP came down.   Patient notes BPs of 170/100 typically with taking her medications. Will add Amlodipine 5 mg QD. Follow up with me in 2 weeks. Luna Glasgow, PA-C  8:48 AM 11/18/2011

## 2011-12-02 ENCOUNTER — Ambulatory Visit: Payer: Medicare Other | Admitting: Physician Assistant

## 2011-12-03 ENCOUNTER — Ambulatory Visit: Payer: Medicare Other | Admitting: Cardiology

## 2012-01-26 HISTORY — PX: KNEE SURGERY: SHX244

## 2012-02-19 HISTORY — PX: ORIF PROXIMAL TIBIAL PLATEAU FRACTURE: SUR953

## 2012-03-30 ENCOUNTER — Other Ambulatory Visit: Payer: Self-pay

## 2012-03-30 DIAGNOSIS — Z1231 Encounter for screening mammogram for malignant neoplasm of breast: Secondary | ICD-10-CM

## 2012-04-19 ENCOUNTER — Encounter: Payer: Self-pay | Admitting: Gastroenterology

## 2012-04-21 ENCOUNTER — Encounter: Payer: Self-pay | Admitting: Gastroenterology

## 2012-05-01 ENCOUNTER — Ambulatory Visit: Payer: Medicare Other | Admitting: Internal Medicine

## 2012-05-08 ENCOUNTER — Ambulatory Visit: Payer: Medicare Other | Admitting: Internal Medicine

## 2012-05-15 ENCOUNTER — Ambulatory Visit
Admission: RE | Admit: 2012-05-15 | Discharge: 2012-05-15 | Disposition: A | Discharge status: Home / Self Care | Payer: Medicare Other | Source: Ambulatory Visit

## 2012-05-15 DIAGNOSIS — Z1231 Encounter for screening mammogram for malignant neoplasm of breast: Secondary | ICD-10-CM

## 2012-05-17 ENCOUNTER — Ambulatory Visit: Payer: Medicare Other | Admitting: Internal Medicine

## 2012-05-22 ENCOUNTER — Encounter: Payer: Self-pay | Admitting: Internal Medicine

## 2012-05-22 ENCOUNTER — Ambulatory Visit (INDEPENDENT_AMBULATORY_CARE_PROVIDER_SITE_OTHER): Payer: Medicare Other | Admitting: Internal Medicine

## 2012-05-22 VITALS — BP 110/78 | HR 100 | Temp 98.9°F | Ht 65.0 in | Wt 156.0 lb

## 2012-05-22 DIAGNOSIS — R296 Repeated falls: Secondary | ICD-10-CM | POA: Insufficient documentation

## 2012-05-22 DIAGNOSIS — J449 Chronic obstructive pulmonary disease, unspecified: Secondary | ICD-10-CM | POA: Insufficient documentation

## 2012-05-22 DIAGNOSIS — I10 Essential (primary) hypertension: Secondary | ICD-10-CM

## 2012-05-22 DIAGNOSIS — Z9181 History of falling: Secondary | ICD-10-CM

## 2012-05-22 DIAGNOSIS — F17200 Nicotine dependence, unspecified, uncomplicated: Secondary | ICD-10-CM

## 2012-05-22 DIAGNOSIS — R0602 Shortness of breath: Secondary | ICD-10-CM

## 2012-05-22 DIAGNOSIS — M810 Age-related osteoporosis without current pathological fracture: Secondary | ICD-10-CM

## 2012-05-22 LAB — BASIC METABOLIC PANEL
CO2: 28 mEq/L (ref 19–32)
Calcium: 9.5 mg/dL (ref 8.4–10.5)
GFR: 100.31 mL/min (ref >60.00)
Sodium: 139 mEq/L (ref 135–145)

## 2012-05-22 LAB — LIPID PANEL
Cholesterol: 278 mg/dL — ABNORMAL HIGH (ref 0–200)
HDL: 46.2 mg/dL (ref >39.00)
Total CHOL/HDL Ratio: 6
Triglycerides: 207 mg/dL — ABNORMAL HIGH (ref 0.0–149.0)
VLDL: 41.4 mg/dL — ABNORMAL HIGH (ref 0.0–40.0)

## 2012-05-22 LAB — CBC WITH DIFFERENTIAL/PLATELET
Basophils Absolute: 0 10*3/uL (ref 0.0–0.1)
Eosinophils Absolute: 0.1 10*3/uL (ref 0.0–0.7)
Hemoglobin: 13.4 g/dL (ref 12.0–15.0)
Lymphs Abs: 3.6 10*3/uL (ref 0.7–4.0)
Monocytes Absolute: 0.6 10*3/uL (ref 0.1–1.0)
RBC: 4.85 Mil/uL (ref 3.87–5.11)
WBC: 8.3 10*3/uL (ref 4.5–10.5)

## 2012-05-22 LAB — HEPATIC FUNCTION PANEL
ALT: 23 U/L (ref 0–35)
Total Bilirubin: 0.7 mg/dL (ref 0.3–1.2)

## 2012-05-22 NOTE — Assessment & Plan Note (Signed)
Continue Wellbutrin XL.  I strongly encouraged tobacco cessation.

## 2012-05-22 NOTE — Progress Notes (Addendum)
Subjective:    Patient ID: Noel Christmas, female    DOB: 1945/12/05, 67 y.o.   MRN: [id number]  HPI  67 year old African American female with history of hypertension, tobacco use and frequent falls to establish.  Patient previously followed by Dr. Concepcion Elk.  Patient reports history of frequent falls since 2002. She was referred and seen by neurologist in 2012. She does not recall diagnosis or recommendations. She has persistent unsteady gait and ambulates with a 4 prong cane. She has history of feeling like she is falling toward her left side. She suffered significant fall in January 2014 and suffered left tibial plateau fracture. She is followed by orthopedic specialist. She has plate and screws. She continues to have intermittent left knee and right knee pain. She questions whether she her symptoms are secondary to osteoarthritis. She reports having problems with bursitis in her right knee and left elbow.  Patient has long standing history of tobacco use. She started at age 8. She averages half pack per day. She has been taking Wellbutrin for tobacco cessation but has been unable to quit. She complains of dyspnea with exertion and intermittent nonexertional chest pain. Patient seen by cardiologist In October of 2013.  She is not sure whether she had PFTs in the past.   Review of Systems  Constitutional: Negative for activity change, appetite change and unexpected weight change.  Eyes: Negative for visual disturbance.  Respiratory: positive for intermittent shortness of breath and dyspnea, negative for chronic cough Cardiovascular: intermittent non exertional chest pain.  Genitourinary: Negative for difficulty urinating.  Neurological: Negative for headaches.  Gastrointestinal: Negative for abdominal pain, heartburn melena or hematochezia Psych: Negative for depression or anxiety Endo:  Hx of osteoporosis  Past Medical History  Diagnosis Date  . Hypertension   . Arthritis   .  Bursitis     left elbow  . Infectious colitis   . Clostridium difficile infection   . HLD (hyperlipidemia)   . Frequent falls     History   Social History  . Marital Status: Single    Spouse Name: N/A    Number of Children: 2  . Years of Education: N/A   Occupational History  . RETIRED    Social History Main Topics  . Smoking status: Current Some Day Smoker -- 0.25 packs/day for 50 years    Types: Cigarettes  . Smokeless tobacco: Never Used     Comment: 2 cigarettes daily  . Alcohol Use: No  . Drug Use: No  . Sexually Active: Not on file   Other Topics Concern  . Not on file   Social History Narrative   Daughter lives with patient.     Son lives in [place]   She is originally from [place], [place]          Past Surgical History  Procedure Laterality Date  . Tonsillectomy    . Orif proximal tibial plateau fracture Left 02/19/2012    Family History  Problem Relation Age of Onset  . Diverticulosis Mother   . Ovarian cancer Mother   . Pancreatic cancer Father   . Colon polyps Sister     Allergies  Allergen Reactions  . Doxycycline Diarrhea  . Minocycline Diarrhea  . Penicillins Rash    Current Outpatient Prescriptions on File Prior to Visit  Medication Sig Dispense Refill  . amLODipine (NORVASC) 5 MG tablet Take 1 tablet (5 mg total) by mouth daily.  30 tablet  5  . buPROPion (WELLBUTRIN XL) 150 MG  24 hr tablet Take 150 mg by mouth daily.      . Calcium Carbonate-Vitamin D (CALCIUM + D PO) Take 1 tablet by mouth daily.      . hydrochlorothiazide (HYDRODIURIL) 25 MG tablet Take 25 mg by mouth daily.        [name] lisinopril (PRINIVIL,ZESTRIL) 40 MG tablet Take 40 mg by mouth daily.      . Magnesium 500 MG CAPS Take 1 capsule by mouth daily.      [name] POTASSIUM GLUCONATE PO Take 1 tablet by mouth daily.      . vitamin B-12 (CYANOCOBALAMIN) 1000 MCG tablet Take 1,000 mcg by mouth daily.       No current facility-administered medications on file prior to visit.     BP 110/78  Pulse 100  Temp(Src) 98.9 F (37.2 C) (Oral)  Ht 5\' 5"  (1.651 m)  Wt 156 lb (70.761 kg)  BMI 25.96 kg/m2           Objective:   Physical Exam  Constitutional: She is oriented to person, place, and time. She appears well-developed and well-nourished.  Neck: Normal range of motion. Neck supple. No thyromegaly present.  No carotid bruit  Cardiovascular: Normal rate and regular rhythm.   No murmur heard. Diminished pulses-left foot  Pulmonary/Chest: Effort normal. She has no wheezes. She has no rales.  Decreased breath sounds throughout, prolonged expiration  Abdominal: Soft. Bowel sounds are normal. There is no tenderness.  Musculoskeletal: She exhibits no edema and no tenderness.  Lymphadenopathy:    She has no cervical adenopathy.  Neurological: She is oriented to person, place, and time. No cranial nerve deficit.  Normal sensation to temperature and vibration of feet Ambulates with a 4 prong cane  Skin: Skin is warm and dry.  Psychiatric: She has a normal mood and affect. Her behavior is normal.          Assessment & Plan:

## 2012-05-22 NOTE — Assessment & Plan Note (Addendum)
Blood pressure is well controlled. No change in current medication regimen. Monitor electrolytes and kidney function. She's had previous issues with hypokalemia.  Obtain fasting lipid panel. Patient is high-risk for cardiovascular disease. Start statin therapy at next office visit.  BP: 110/78 mmHg

## 2012-05-22 NOTE — Assessment & Plan Note (Signed)
Patient has history of frequent falls. She was evaluated by neurologist in 2012. We will try to obtain previous records. Her neurologic exam is unremarkable. No evidence of peripheral neuropathy. Patient reports leaning to the left with previous falls. Question previous cerebellar stroke. Consider MRI of brain.

## 2012-05-22 NOTE — Assessment & Plan Note (Signed)
Patient reports history of osteoporosis. She suffered a left tibial plateau fracture in January 2014. Repeat DEXA scan.  Consider bisphosphonate therapy.

## 2012-05-22 NOTE — Assessment & Plan Note (Signed)
Patient has long-standing history of tobacco use. She likely has undiagnosed COPD. Arrange pulmonary function testing. Tobacco cessation strongly encouraged.

## 2012-05-25 ENCOUNTER — Ambulatory Visit (AMBULATORY_SURGERY_CENTER): Payer: Medicare Other | Admitting: *Deleted

## 2012-05-25 VITALS — Ht 65.0 in | Wt 157.0 lb

## 2012-05-25 DIAGNOSIS — Z86010 Personal history of colonic polyps: Secondary | ICD-10-CM

## 2012-05-25 DIAGNOSIS — Z1211 Encounter for screening for malignant neoplasm of colon: Secondary | ICD-10-CM

## 2012-05-25 MED ORDER — MOVIPREP 100 G PO SOLR: 1.0000 | Freq: Once | ORAL | Status: DC

## 2012-05-25 NOTE — Progress Notes (Signed)
No egg or soy allergy. ewm No problems with past sedations. ewm 09-21-2011 Ov with Dr Christella Hartigan he mentions in note pt had colon 04-2007 with Dr Loreta Ave and had 4 polyps with an adenomatous polyp present. No procedure scanned in epic but he is aware of pt's history. ewm

## 2012-05-26 ENCOUNTER — Telehealth: Payer: Self-pay

## 2012-05-26 ENCOUNTER — Other Ambulatory Visit: Payer: Self-pay | Admitting: *Deleted

## 2012-05-26 ENCOUNTER — Encounter: Payer: Self-pay | Admitting: Gastroenterology

## 2012-05-26 MED ORDER — ATORVASTATIN CALCIUM 20 MG PO TABS: 20.0000 mg | ORAL_TABLET | Freq: Every day | ORAL | Status: DC

## 2012-05-26 NOTE — Telephone Encounter (Signed)
Message copied by Doree Barthel on Fri May 26, 2012 11:05 AM ------      Message from: [facility], [place] L      Created: Fri May 26, 2012  9:24 AM       Pt came in the office to pay off CB balance and states she needs to see Dr. Pearlean Brownie ASAP, she cannot wait.  She wants a call back today.  She can be reached @ [phone].  Pt broke her knee and had to have surgery and her doctors want to know her neurologic state. ------

## 2012-05-26 NOTE — Telephone Encounter (Signed)
Spoke to Kristina Ayala. Requesting an appt w/ Dr. Pearlean Brownie asap because she has fallen several times since seeing him in 2012, broke her knee recently, and physical rehab and PCP suggest she sees neurologist because she is having balance problems, and leaning to the left. Advised Kristina Ayala no appts available immediately, will fwd messge to Dr. Pearlean Brownie asst, to sched Kristina Ayala and place on waiting list. Kristina Ayala says she will speak w/ Dr. Artist Pais and see if he will contact Dr. Pearlean Brownie about getting earlier appt.

## 2012-05-26 NOTE — Telephone Encounter (Signed)
Called pt, no answer, left a vmail. Will forward to Dr. Pearlean Brownie asst.

## 2012-05-30 ENCOUNTER — Inpatient Hospital Stay: Admission: RE | Admit: 2012-05-30 | Payer: Medicare Other | Source: Ambulatory Visit

## 2012-05-31 ENCOUNTER — Telehealth: Payer: Self-pay | Admitting: Neurology

## 2012-06-07 ENCOUNTER — Ambulatory Visit (AMBULATORY_SURGERY_CENTER): Payer: Medicare Other | Admitting: Gastroenterology

## 2012-06-07 ENCOUNTER — Encounter: Payer: Self-pay | Admitting: Gastroenterology

## 2012-06-07 VITALS — BP 130/76 | HR 90 | Temp 97.3°F | Resp 11 | Ht 65.0 in | Wt 157.0 lb

## 2012-06-07 DIAGNOSIS — Z86010 Personal history of colonic polyps: Secondary | ICD-10-CM

## 2012-06-07 DIAGNOSIS — K5730 Diverticulosis of large intestine without perforation or abscess without bleeding: Secondary | ICD-10-CM

## 2012-06-07 DIAGNOSIS — Z1211 Encounter for screening for malignant neoplasm of colon: Secondary | ICD-10-CM

## 2012-06-07 MED ORDER — SODIUM CHLORIDE 0.9 % IV SOLN: 500.0000 mL | INTRAVENOUS | Status: DC

## 2012-06-07 NOTE — Progress Notes (Signed)
Patient did not experience any of the following events: a burn prior to discharge; a fall within the facility; wrong site/side/patient/procedure/implant event; or a hospital transfer or hospital admission upon discharge from the facility. (G8907) Patient did not have preoperative order for IV antibiotic SSI prophylaxis. (G8918)  

## 2012-06-07 NOTE — Op Note (Signed)
[facility] [address] [place], [address]   COLONOSCOPY PROCEDURE REPORT  PATIENT: Kristina Ayala, Kristina Ayala  MR#: [id number] BIRTHDATE: [birth year] , 67  yrs. old GENDER: Female ENDOSCOPIST: Rachael Fee, MD PROCEDURE DATE:  06/07/2012 PROCEDURE:   Colonoscopy, surveillance ASA CLASS:   Class II INDICATIONS:colonoscopy with Dr.  Loreta Ave in 04/2007, some small polyps were removed and one was a TA.[name] MEDICATIONS: Fentanyl 75 mcg IV, Versed 8 mg IV, and These medications were titrated to patient response per physician's verbal order  DESCRIPTION OF PROCEDURE:   After the risks benefits and alternatives of the procedure were thoroughly explained, informed consent was obtained.  A digital rectal exam revealed no abnormalities of the rectum.   The LB [id number]  endoscope was introduced through the anus and advanced to the cecum, which was identified by both the appendix and ileocecal valve. No adverse events experienced.   The quality of the prep was good.  The instrument was then slowly withdrawn as the colon was fully examined.  COLON FINDINGS: There were numerous small diverticulum in left colon.  The examination was otherwise normal.  Retroflexed views revealed no abnormalities. The time to cecum=3 minutes 28 seconds. Withdrawal time=6 minutes 54 seconds.  The scope was withdrawn and the procedure completed. COMPLICATIONS: There were no complications.  ENDOSCOPIC IMPRESSION: There were numerous small diverticulum in left colon. The examination was otherwise normal.  No polyps or cancers  RECOMMENDATIONS: You should continue to follow colorectal cancer screening guidelines for "routine risk" patients with a repeat colonoscopy in 10 years. There is no need for FOBT (stool) testing for at least 5 years.   eSigned:  Rachael Fee, MD 06/07/2012 10:23 AM   cc: Thomos Lemons, DO

## 2012-06-07 NOTE — Patient Instructions (Addendum)
Impressions/recommendations:  Normal colon  Repeat colonoscopy in 10 years.  YOU HAD AN ENDOSCOPIC PROCEDURE TODAY AT THE [facility]: Refer to the procedure report that was given to you for any specific questions about what was found during the examination.  If the procedure report does not answer your questions, please call your gastroenterologist to clarify.  If you requested that your care partner not be given the details of your procedure findings, then the procedure report has been included in a sealed envelope for you to review at your convenience later.  YOU SHOULD EXPECT: Some feelings of bloating in the abdomen. Passage of more gas than usual.  Walking can help get rid of the air that was put into your GI tract during the procedure and reduce the bloating. If you had a lower endoscopy (such as a colonoscopy or flexible sigmoidoscopy) you may notice spotting of blood in your stool or on the toilet paper. If you underwent a bowel prep for your procedure, then you may not have a normal bowel movement for a few days.  DIET: Your first meal following the procedure should be a light meal and then it is ok to progress to your normal diet.  A half-sandwich or bowl of soup is an example of a good first meal.  Heavy or fried foods are harder to digest and may make you feel nauseous or bloated.  Likewise meals heavy in dairy and vegetables can cause extra gas to form and this can also increase the bloating.  Drink plenty of fluids but you should avoid alcoholic beverages for 24 hours.  ACTIVITY: Your care partner should take you home directly after the procedure.  You should plan to take it easy, moving slowly for the rest of the day.  You can resume normal activity the day after the procedure however you should NOT DRIVE or use heavy machinery for 24 hours (because of the sedation medicines used during the test).    SYMPTOMS TO REPORT IMMEDIATELY: A gastroenterologist can be reached at  any hour.  During normal business hours, 8:30 AM to 5:00 PM Monday through Friday, call [phone].  After hours and on weekends, please call the GI answering service at [phone] who will take a message and have the physician on call contact you.   Following lower endoscopy (colonoscopy or flexible sigmoidoscopy):  Excessive amounts of blood in the stool  Significant tenderness or worsening of abdominal pains  Swelling of the abdomen that is new, acute  Fever of 100F or higher   FOLLOW UP: If any biopsies were taken you will be contacted by phone or by letter within the next 1-3 weeks.  Call your gastroenterologist if you have not heard about the biopsies in 3 weeks.  Our staff will call the home number listed on your records the next business day following your procedure to check on you and address any questions or concerns that you may have at that time regarding the information given to you following your procedure. This is a courtesy call and so if there is no answer at the home number and we have not heard from you through the emergency physician on call, we will assume that you have returned to your regular daily activities without incident.  SIGNATURES/CONFIDENTIALITY: You and/or your care partner have signed paperwork which will be entered into your electronic medical record.  These signatures attest to the fact that that the information above on your After Visit Summary has been   reviewed and is understood.  Full responsibility of the confidentiality of this discharge information lies with you and/or your care-partner. 

## 2012-06-08 ENCOUNTER — Telehealth: Payer: Self-pay | Admitting: *Deleted

## 2012-06-08 NOTE — Telephone Encounter (Signed)
  Follow up Call-  Call back number 06/07/2012  Post procedure Call Back phone  # [phone]  Permission to leave phone message Yes     Patient questions:  Do you have a fever, pain , or abdominal swelling? no Pain Score  0 *  Have you tolerated food without any problems? yes  Have you been able to return to your normal activities? yes  Do you have any questions about your discharge instructions: Diet   no Medications  no Follow up visit  no  Do you have questions or concerns about your Care? no  Actions: * If pain score is 4 or above: No action needed, pain <4.

## 2012-06-12 NOTE — Telephone Encounter (Signed)
done

## 2012-06-20 ENCOUNTER — Ambulatory Visit: Payer: Medicare Other | Admitting: Internal Medicine

## 2012-06-27 ENCOUNTER — Ambulatory Visit (INDEPENDENT_AMBULATORY_CARE_PROVIDER_SITE_OTHER): Payer: Medicare Other | Admitting: Internal Medicine

## 2012-06-27 ENCOUNTER — Encounter: Payer: Self-pay | Admitting: Internal Medicine

## 2012-06-27 VITALS — BP 126/80 | HR 80 | Temp 98.7°F | Wt 154.0 lb

## 2012-06-27 DIAGNOSIS — I10 Essential (primary) hypertension: Secondary | ICD-10-CM

## 2012-06-27 DIAGNOSIS — R296 Repeated falls: Secondary | ICD-10-CM

## 2012-06-27 DIAGNOSIS — R0602 Shortness of breath: Secondary | ICD-10-CM

## 2012-06-27 DIAGNOSIS — Z9181 History of falling: Secondary | ICD-10-CM

## 2012-06-27 MED ORDER — HYDROCHLOROTHIAZIDE 25 MG PO TABS: 25.0000 mg | ORAL_TABLET | Freq: Every day | ORAL | Status: DC

## 2012-06-27 MED ORDER — AMLODIPINE BESYLATE 5 MG PO TABS: 5.0000 mg | ORAL_TABLET | Freq: Every day | ORAL | Status: DC

## 2012-06-27 MED ORDER — ATORVASTATIN CALCIUM 20 MG PO TABS: 20.0000 mg | ORAL_TABLET | Freq: Every day | ORAL | Status: DC

## 2012-06-27 MED ORDER — LISINOPRIL 40 MG PO TABS: 40.0000 mg | ORAL_TABLET | Freq: Every day | ORAL | Status: DC

## 2012-06-27 NOTE — Patient Instructions (Addendum)
Please complete the following lab tests before your next follow up appointment: FLP, LFTs - 272.4 

## 2012-06-27 NOTE — Assessment & Plan Note (Signed)
Awaiting neurologic evaluation.

## 2012-06-27 NOTE — Progress Notes (Signed)
Subjective:    Patient ID: Kristina Ayala, female    DOB: October 15, 1945, 67 y.o.   MRN: [id number]  HPI  67 year old African American female with history of hypertension, tobacco use and frequent falls for followup. She reports seeing a neurologist yesterday. Neurologist requesting all previous MRIs and she has followup appointment June 23.  She did not complete pulmonary function tests as directed. She is scheduled for PFTs this Friday.  Patient continues to have intermittent shortness of breath and dyspnea with exertion. Her symptoms worse with laying on her left side.   Review of Systems Negative for cough,  No change in tobacco use  Past Medical History  Diagnosis Date  . Hypertension   . Arthritis   . Bursitis     left elbow  . Infectious colitis   . Clostridium difficile infection   . HLD (hyperlipidemia)   . Frequent falls     History   Social History  . Marital Status: Single    Spouse Name: N/A    Number of Children: 2  . Years of Education: N/A   Occupational History  . RETIRED    Social History Main Topics  . Smoking status: Current Some Day Smoker -- 0.25 packs/day for 50 years    Types: Cigarettes  . Smokeless tobacco: Never Used     Comment: 2 cigarettes daily  . Alcohol Use: No  . Drug Use: No  . Sexually Active: Not on file   Other Topics Concern  . Not on file   Social History Narrative   Daughter lives with patient.     Son lives in [place]   She is originally from [place], [place]          Past Surgical History  Procedure Laterality Date  . Tonsillectomy    . Orif proximal tibial plateau fracture Left 02/19/2012  . Polypectomy    . Colonoscopy      Family History  Problem Relation Age of Onset  . Diverticulosis Mother   . Ovarian cancer Mother   . Pancreatic cancer Father   . Lung cancer Father   . Colon polyps Sister   . Colon cancer Neg Hx     Allergies  Allergen Reactions  . Doxycycline Diarrhea  . Minocycline Diarrhea   . Penicillins Rash    Current Outpatient Prescriptions on File Prior to Visit  Medication Sig Dispense Refill  . aspirin 81 MG tablet Take 81 mg by mouth 2 (two) times daily before a meal.      . buPROPion (WELLBUTRIN XL) 150 MG 24 hr tablet Take 150 mg by mouth daily.      . Calcium Carbonate-Vitamin D (CALCIUM + D PO) Take 1 tablet by mouth daily.      . diclofenac sodium (VOLTAREN) 1 % GEL Apply topically as needed.      . Magnesium 500 MG CAPS Take 1 capsule by mouth daily.      . nabumetone (RELAFEN) 750 MG tablet       . vitamin B-12 (CYANOCOBALAMIN) 1000 MCG tablet Take 1,000 mcg by mouth daily.       No current facility-administered medications on file prior to visit.    BP 126/80  Pulse 80  Temp(Src) 98.7 F (37.1 C) (Oral)  Wt 154 lb (69.854 kg)  BMI 25.63 kg/m2  EKG reviewed     Objective:   Physical Exam  Constitutional: She is oriented to person, place, and time. She appears well-developed and well-nourished.  HENT:  Head: Normocephalic and atraumatic.  Mouth/Throat: Oropharynx is clear and moist.  Cardiovascular: Normal rate, regular rhythm and normal heart sounds.  Exam reveals no friction rub.   No murmur heard. No muffled heart sounds  Pulmonary/Chest: Effort normal and breath sounds normal. She has no wheezes.  Musculoskeletal: She exhibits no edema.  Neurological: She is alert and oriented to person, place, and time. No cranial nerve deficit.  Skin: Skin is warm and dry.  Psychiatric: She has a normal mood and affect. Her behavior is normal.          Assessment & Plan:

## 2012-06-27 NOTE — Assessment & Plan Note (Signed)
Awaiting results of pulmonary function testing. She reports shortness of breath sometimes worse living in her left side. I suggest we obtain 2-D echocardiogram. She declines for now.

## 2012-07-25 ENCOUNTER — Other Ambulatory Visit: Payer: Self-pay | Admitting: Neurology

## 2012-07-25 DIAGNOSIS — R269 Unspecified abnormalities of gait and mobility: Secondary | ICD-10-CM

## 2012-07-26 ENCOUNTER — Other Ambulatory Visit: Payer: Self-pay | Admitting: Neurology

## 2012-07-26 DIAGNOSIS — R269 Unspecified abnormalities of gait and mobility: Secondary | ICD-10-CM

## 2012-07-29 ENCOUNTER — Other Ambulatory Visit: Payer: Medicare Other

## 2012-08-02 ENCOUNTER — Ambulatory Visit
Admission: RE | Admit: 2012-08-02 | Discharge: 2012-08-02 | Disposition: A | Discharge status: Home / Self Care | Payer: Medicare Other | Source: Ambulatory Visit | Attending: Neurology | Admitting: Neurology

## 2012-08-02 DIAGNOSIS — R269 Unspecified abnormalities of gait and mobility: Secondary | ICD-10-CM

## 2012-08-21 ENCOUNTER — Ambulatory Visit (INDEPENDENT_AMBULATORY_CARE_PROVIDER_SITE_OTHER): Payer: Medicare Other | Admitting: Internal Medicine

## 2012-08-21 DIAGNOSIS — R0602 Shortness of breath: Secondary | ICD-10-CM

## 2012-08-21 LAB — PULMONARY FUNCTION TEST

## 2012-08-21 NOTE — Progress Notes (Signed)
PFT done today. 

## 2012-09-08 ENCOUNTER — Encounter: Payer: Self-pay | Admitting: Internal Medicine

## 2012-09-11 ENCOUNTER — Ambulatory Visit: Payer: Medicare Other | Admitting: Internal Medicine

## 2012-09-27 ENCOUNTER — Ambulatory Visit: Payer: Self-pay | Admitting: Neurology

## 2012-09-28 ENCOUNTER — Encounter: Payer: Self-pay | Admitting: Internal Medicine

## 2012-09-28 ENCOUNTER — Ambulatory Visit (INDEPENDENT_AMBULATORY_CARE_PROVIDER_SITE_OTHER): Payer: Medicare Other | Admitting: Internal Medicine

## 2012-09-28 VITALS — BP 120/80 | HR 108 | Temp 98.6°F | Wt 164.0 lb

## 2012-09-28 DIAGNOSIS — J449 Chronic obstructive pulmonary disease, unspecified: Secondary | ICD-10-CM

## 2012-09-28 DIAGNOSIS — R296 Repeated falls: Secondary | ICD-10-CM

## 2012-09-28 DIAGNOSIS — E785 Hyperlipidemia, unspecified: Secondary | ICD-10-CM

## 2012-09-28 DIAGNOSIS — Z9181 History of falling: Secondary | ICD-10-CM

## 2012-09-28 MED ORDER — ATORVASTATIN CALCIUM 20 MG PO TABS: 20.0000 mg | ORAL_TABLET | Freq: Every day | ORAL | Status: DC

## 2012-09-28 MED ORDER — VARENICLINE TARTRATE 0.5 MG X 11 & 1 MG X 42 PO MISC: ORAL | Status: DC

## 2012-09-28 NOTE — Assessment & Plan Note (Addendum)
Pulmonary function tests showed mild obstructive airway disease with slight response to bronchodilator. It also showed air trapping with increased RV/TLC. Diffusion mildly reduced.  Patient has mild COPD. She is interested in smoking cessation. Start Chantix. Reassess in one month. Consider starting inhaled corticosteroid.  Patient declines influenza vaccine and Pneumovax.

## 2012-09-28 NOTE — Progress Notes (Signed)
Subjective:    Patient ID: Kristina Ayala, female    DOB: [birth year], 67 y.o.   MRN: [id number]  HPI  67 year old African American female with history of hypertension, tobacco use and frequent falls for followup. Patient completed MRI of cervical spine and thoracic spine which were unremarkable and did not explain patient's gait disturbance. Patient reports starting yoga, pilates and water aerobics. She feels much more stable.  She completed pulmonary function testing. It showed mild obstructive airway disease with slight response to bronchodilator. She has air trapping with increased RV/TLC. Diffusion mildly reduced. She continues to smoke but is interested in smoking cessation.  Review of Systems Negative for chest pain.  She has not been taking her lipitor  Past Medical History  Diagnosis Date  . Hypertension   . Arthritis   . Bursitis     left elbow  . Infectious colitis   . Clostridium difficile infection   . HLD (hyperlipidemia)   . Frequent falls     History   Social History  . Marital Status: Single    Spouse Name: N/A    Number of Children: 2  . Years of Education: N/A   Occupational History  . RETIRED    Social History Main Topics  . Smoking status: Current Some Day Smoker -- 0.25 packs/day for 50 years    Types: Cigarettes  . Smokeless tobacco: Never Used     Comment: 2 cigarettes daily  . Alcohol Use: No  . Drug Use: No  . Sexual Activity: Not on file   Other Topics Concern  . Not on file   Social History Narrative   Daughter lives with patient.     Son lives in [place]   She is originally from [place], [place]          Past Surgical History  Procedure Laterality Date  . Tonsillectomy    . Orif proximal tibial plateau fracture Left 02/19/2012  . Polypectomy    . Colonoscopy      Family History  Problem Relation Age of Onset  . Diverticulosis Mother   . Ovarian cancer Mother   . Pancreatic cancer Father   . Lung cancer Father   . Colon  polyps Sister   . Colon cancer Neg Hx     Allergies  Allergen Reactions  . Doxycycline Diarrhea  . Minocycline Diarrhea  . Penicillins Rash    Current Outpatient Prescriptions on File Prior to Visit  Medication Sig Dispense Refill  . amLODipine (NORVASC) 5 MG tablet Take 1 tablet (5 mg total) by mouth daily.  90 tablet  1  . aspirin 81 MG tablet Take 81 mg by mouth 2 (two) times daily before a meal.      . Calcium Carbonate-Vitamin D (CALCIUM + D PO) Take 1 tablet by mouth daily.      . diclofenac sodium (VOLTAREN) 1 % GEL Apply topically as needed.      . hydrochlorothiazide (HYDRODIURIL) 25 MG tablet Take 1 tablet (25 mg total) by mouth daily.  90 tablet  1  . KLOR-CON 10 10 MEQ tablet Take 1 tablet by mouth daily.      [name] lisinopril (PRINIVIL,ZESTRIL) 40 MG tablet Take 1 tablet (40 mg total) by mouth daily.  90 tablet  1  . Magnesium 500 MG CAPS Take 1 capsule by mouth daily.      . vitamin B-12 (CYANOCOBALAMIN) 1000 MCG tablet Take 1,000 mcg by mouth daily.      . nabumetone (RELAFEN)  750 MG tablet        No current facility-administered medications on file prior to visit.    BP 120/80  Pulse 108  Temp(Src) 98.6 F (37 C) (Oral)  Wt 164 lb (74.39 kg)  BMI 27.29 kg/m2  SpO2 98%       Objective:   Physical Exam  Constitutional: She is oriented to person, place, and time. She appears well-developed and well-nourished.  HENT:  Head: Normocephalic and atraumatic.  Cardiovascular: Normal rate, regular rhythm and normal heart sounds.   No murmur heard. Pulmonary/Chest: Effort normal and breath sounds normal. She has no wheezes.  Musculoskeletal: She exhibits no edema.  Neurological: She is alert and oriented to person, place, and time. No cranial nerve deficit.  Psychiatric: She has a normal mood and affect. Her behavior is normal.          Assessment & Plan:

## 2012-09-28 NOTE — Patient Instructions (Signed)
Please take your cholesterol medication daily / regularly Please complete the following lab tests before your next follow up appointment: BMET - 401.9 FLP, LFTs - 272.4

## 2012-09-28 NOTE — Assessment & Plan Note (Signed)
I encouraged compliance with Lipitor.  FLP and LFTs before next OV. Lab Results  Component Value Date   CHOL 278* 05/22/2012   HDL 46.20 05/22/2012   LDLDIRECT 213.1 05/22/2012   TRIG 207.0* 05/22/2012   CHOLHDL 6 05/22/2012

## 2012-09-28 NOTE — Assessment & Plan Note (Signed)
MRI of C-spine and thoracic spine were unremarkable. Gait instability improved with starting yoga, pilates and water aerobics.

## 2012-10-20 ENCOUNTER — Encounter: Payer: Self-pay | Admitting: Internal Medicine

## 2012-10-20 ENCOUNTER — Ambulatory Visit: Payer: Medicare Other | Attending: Family Medicine | Admitting: Internal Medicine

## 2012-10-20 VITALS — BP 148/84 | HR 92 | Temp 98.1°F | Ht 60.75 in | Wt 165.0 lb

## 2012-10-20 DIAGNOSIS — Z9181 History of falling: Secondary | ICD-10-CM | POA: Insufficient documentation

## 2012-10-20 DIAGNOSIS — I10 Essential (primary) hypertension: Secondary | ICD-10-CM

## 2012-10-20 LAB — COMPREHENSIVE METABOLIC PANEL
ALT: 32 U/L (ref 0–35)
AST: 23 U/L (ref 0–37)
Alkaline Phosphatase: 85 U/L (ref 39–117)
BUN: 16 mg/dL (ref 6–23)
Calcium: 9.5 mg/dL (ref 8.4–10.5)
Chloride: 101 mEq/L (ref 96–112)
Creat: 0.73 mg/dL (ref 0.50–1.10)

## 2012-10-20 LAB — LIPID PANEL
Cholesterol: 297 mg/dL — ABNORMAL HIGH (ref 0–200)
HDL: 49 mg/dL (ref >39)
Total CHOL/HDL Ratio: 6.1 Ratio
Triglycerides: 306 mg/dL — ABNORMAL HIGH (ref <150)

## 2012-10-20 LAB — CBC WITH DIFFERENTIAL/PLATELET
Basophils Absolute: 0 10*3/uL (ref 0.0–0.1)
Basophils Relative: 0 % (ref 0–1)
Eosinophils Relative: 3 % (ref 0–5)
Lymphocytes Relative: 55 % — ABNORMAL HIGH (ref 12–46)
MCHC: 33.9 g/dL (ref 30.0–36.0)
Neutro Abs: 2 10*3/uL (ref 1.7–7.7)
Platelets: 271 10*3/uL (ref 150–400)
RDW: 14.8 % (ref 11.5–15.5)
WBC: 5.8 10*3/uL (ref 4.0–10.5)

## 2012-10-20 LAB — HEMOGLOBIN A1C
Hgb A1c MFr Bld: 6 % — ABNORMAL HIGH (ref <5.7)
Mean Plasma Glucose: 126 mg/dL — ABNORMAL HIGH (ref <117)

## 2012-10-20 LAB — VITAMIN B12: Vitamin B-12: 640 pg/mL (ref 211–911)

## 2012-10-20 MED ORDER — AMLODIPINE BESYLATE 5 MG PO TABS: 5.0000 mg | ORAL_TABLET | Freq: Every day | ORAL | Status: DC

## 2012-10-20 MED ORDER — VENLAFAXINE HCL ER 75 MG PO CP24: 75.0000 mg | ORAL_CAPSULE | Freq: Every day | ORAL | Status: DC

## 2012-10-20 MED ORDER — NABUMETONE 750 MG PO TABS: 750.0000 mg | ORAL_TABLET | Freq: Every day | ORAL | Status: DC

## 2012-10-20 MED ORDER — LISINOPRIL 40 MG PO TABS: 40.0000 mg | ORAL_TABLET | Freq: Every day | ORAL | Status: DC

## 2012-10-20 MED ORDER — HYDROCHLOROTHIAZIDE 25 MG PO TABS: 25.0000 mg | ORAL_TABLET | Freq: Every day | ORAL | Status: DC

## 2012-10-20 MED ORDER — ATORVASTATIN CALCIUM 20 MG PO TABS: 20.0000 mg | ORAL_TABLET | Freq: Every day | ORAL | Status: DC

## 2012-10-20 MED ORDER — POTASSIUM CHLORIDE ER 10 MEQ PO TBCR: 10.0000 meq | EXTENDED_RELEASE_TABLET | Freq: Every day | ORAL | Status: DC

## 2012-10-20 MED ORDER — MAGNESIUM 500 MG PO CAPS: 1.0000 | ORAL_CAPSULE | Freq: Every day | ORAL | Status: DC

## 2012-10-20 NOTE — Patient Instructions (Signed)
Orthostatic Hypotension °Orthostatic hypotension is a sudden fall in blood pressure. It occurs when a person goes from a sitting or lying position to a standing position. °CAUSES  °· Loss of body fluids (dehydration). °· Medicines that lower blood pressure. °· Sudden changes in posture, such as sudden standing when you have been sitting or lying down. °· Taking too much of your medicine. °SYMPTOMS  °· Lightheadedness or dizziness. °· Fainting or near-fainting. °· A fast heart rate (tachycardia). °· Weakness. °· Feeling tired (fatigue). °DIAGNOSIS  °Your caregiver may find the cause of orthostatic hypotension through: °· A history and/or physical exam. °· Checking your blood pressure. Your caregiver will check your blood pressure when you are: °· Lying down. °· Sitting. °· Standing. °· Tilt table testing. In this test, you are placed on a table that goes from a lying position to a standing position. You will be strapped to the table. This test helps to monitor your blood pressure and heart rate when you are in different positions. °TREATMENT  °· If orthostatic hypotension is caused by your medicines, your caregiver will need to adjust your dosage. Do not stop or adjust your medicine on your own. °· When changing positions, make these changes slowly. This allows your body to adjust to the different position. °· Compression stockings that are worn on your lower legs may be helpful. °· Your caregiver may have you consume extra salt. Do not add extra salt to your diet unless directed by your caregiver. °· Eat frequent, small meals. Avoid sudden standing after eating. °· Avoid hot showers or excessive heat. °· Your caregiver may give you fluids through the vein (intravenous). °· Your caregiver may put you on medicine to help enhance fluid retention. °SEEK IMMEDIATE MEDICAL CARE IF:  °· You faint or have a near-fainting episode. Call your local emergency services (911 in [place] °· You have or develop chest pain. °· You  feel sick to your stomach (nauseous) or vomit. °· You have a loss of feeling or movement in your arms or legs. °· You have difficulty talking, slurred speech, or you are unable to talk. °· You have difficulty thinking or have confused thinking. °MAKE SURE YOU:  °· Understand these instructions. °· Will watch your condition. °· Will get help right away if you are not doing well or get worse. °Document Released: 01/01/2002 Document Revised: 04/05/2011 Document Reviewed: 04/26/2008 °Exit[employer]® Patient Information ©2014 [employer]. ° °

## 2012-10-20 NOTE — Progress Notes (Signed)
Patient ID: Kristina Ayala, female   DOB: October 08, 1945, 67 y.o.   MRN: [id number]   CC: Frequent falls  HPI: Patient is 67 year old female with history of hypertension and hyperlipidemia who presents to clinic with main concern of recurrent falls at home. She explains the falls occur unpredictably with no specific symptoms prior to the events. She denies history of seizures and denies ever losing consciousness. She denies chest pain or shortness of breath, no specific abdominal concerns, no specific focal neurological symptoms. She denies visual changes or headaches. She reports compliance with medications.  Allergies  Allergen Reactions  . Doxycycline Diarrhea  . Minocycline Diarrhea  . Penicillins Rash   Past Medical History  Diagnosis Date  . Hypertension   . Arthritis   . Bursitis     left elbow  . Infectious colitis   . Clostridium difficile infection   . HLD (hyperlipidemia)   . Frequent falls    Current Outpatient Prescriptions on File Prior to Visit  Medication Sig Dispense Refill  . aspirin 81 MG tablet Take 81 mg by mouth 2 (two) times daily before a meal.      . Calcium Carbonate-Vitamin D (CALCIUM + D PO) Take 1 tablet by mouth daily.      . diclofenac sodium (VOLTAREN) 1 % GEL Apply topically as needed.      . varenicline (CHANTIX STARTING MONTH PAK) 0.5 MG X 11 & 1 MG X 42 tablet Take one 0.5 mg tablet by mouth once daily for 3 days, then increase to one 0.5 mg tablet twice daily for 4 days, then increase to one 1 mg tablet twice daily.  53 tablet  0  . vitamin B-12 (CYANOCOBALAMIN) 1000 MCG tablet Take 1,000 mcg by mouth daily.       No current facility-administered medications on file prior to visit.   Family History  Problem Relation Age of Onset  . Diverticulosis Mother   . Ovarian cancer Mother   . Pancreatic cancer Father   . Lung cancer Father   . Colon polyps Sister   . Colon cancer Neg Hx    History   Social History  . Marital Status: Single   Spouse Name: N/A    Number of Children: 2  . Years of Education: N/A   Occupational History  . RETIRED    Social History Main Topics  . Smoking status: Current Some Day Smoker -- 0.25 packs/day for 50 years    Types: Cigarettes  . Smokeless tobacco: Never Used     Comment: 2 cigarettes daily  . Alcohol Use: No  . Drug Use: No  . Sexual Activity: Not on file   Other Topics Concern  . Not on file   Social History Narrative   Daughter lives with patient.     Son lives in [place]   She is originally from [place], [place]          Review of Systems  Constitutional: Negative for fever, chills, diaphoresis, activity change, appetite change and fatigue.  HENT: Negative for ear pain, nosebleeds, congestion, facial swelling, rhinorrhea, neck pain, neck stiffness and ear discharge.   Eyes: Negative for pain, discharge, redness, itching and visual disturbance.  Respiratory: Negative for cough, choking, chest tightness, shortness of breath, wheezing and stridor.   Cardiovascular: Negative for chest pain, palpitations and leg swelling.  Gastrointestinal: Negative for abdominal distention.  Genitourinary: Negative for dysuria, urgency, frequency, hematuria, flank pain, decreased urine volume, difficulty urinating and dyspareunia.  Musculoskeletal: Negative for  back pain, joint swelling, arthralgias.  Neurological: Negative for dizziness, tremors, seizures, syncope, facial asymmetry, speech difficulty, weakness, light-headedness, numbness and headaches.  Hematological: Negative for adenopathy. Does not bruise/bleed easily.  Psychiatric/Behavioral: Negative for hallucinations, behavioral problems, confusion, dysphoric mood, decreased concentration and agitation.    Objective:   Filed Vitals:   10/20/12 1126  BP: 148/84  Pulse: 92  Temp: 98.1 F (36.7 C)    Physical Exam  Constitutional: Appears well-developed and well-nourished. No distress.  HENT: Normocephalic. External right and  left ear normal. Oropharynx is clear and moist.  Eyes: Conjunctivae and EOM are normal. PERRLA, no scleral icterus.  Neck: Normal ROM. Neck supple. No JVD. No tracheal deviation. No thyromegaly.  CVS: RRR, S1/S2 +, no murmurs, no gallops, no carotid bruit.  Pulmonary: Effort and breath sounds normal, no stridor, rhonchi, wheezes, rales.  Abdominal: Soft. BS +,  no distension, tenderness, rebound or guarding.  Musculoskeletal: Normal range of motion. No edema and no tenderness.  Lymphadenopathy: No lymphadenopathy noted, cervical, inguinal. Neuro: Alert. Normal reflexes, muscle tone coordination. No cranial nerve deficit. Skin: Skin is warm and dry. No rash noted. Not diaphoretic. No erythema. No pallor.  Psychiatric: Normal mood and affect. Behavior, judgment, thought content normal.   Lab Results  Component Value Date   WBC 8.3 05/22/2012   HGB 13.4 05/22/2012   HCT 40.2 05/22/2012   MCV 82.8 05/22/2012   PLT 293.0 05/22/2012   Lab Results  Component Value Date   CREATININE 0.7 05/22/2012   BUN 18 05/22/2012   NA 139 05/22/2012   K 3.5 05/22/2012   CL 104 05/22/2012   CO2 28 05/22/2012    No results found for this basename: HGBA1C   Lipid Panel     Component Value Date/Time   CHOL 278* 05/22/2012 1536   TRIG 207.0* 05/22/2012 1536   HDL 46.20 05/22/2012 1536   CHOLHDL 6 05/22/2012 1536   VLDL 41.4* 05/22/2012 1536       Assessment and plan:   Patient Active Problem List   Diagnosis Date Noted  . Frequent falls - unclear etiology. I have reviewed the medicines and I do not think there is any specific medication that could cause orthostasis. We'll check electrolyte panel today, TSH, vitamin D and vitamin B12 level. I have educated patient on checking blood pressure regularly and when she gets home I advised her to check blood pressure when she is laying down and to record the heart rate as well as her monitor allows her to check both. I have also advised her to check her blood pressure  with standing position and to write numbers down and to call us back with the results.  05/22/2012  . Hypertension 08/04/2011

## 2012-10-26 ENCOUNTER — Telehealth: Payer: Self-pay | Admitting: Internal Medicine

## 2012-10-26 NOTE — Telephone Encounter (Signed)
Dr. Izola Price: Pt calling to f/u with Dr Izola Price with bp readings taken on 10/26/12: Lying flat: 152/90, sitting: 152/96, standing: 148/84.   Clinical: Pt was told she would receive call with blood work results, but has not been contacted.  Please f/u with pt.

## 2012-11-02 ENCOUNTER — Telehealth: Payer: Self-pay | Admitting: Internal Medicine

## 2012-11-02 ENCOUNTER — Ambulatory Visit: Payer: Medicare Other | Attending: Internal Medicine | Admitting: *Deleted

## 2012-11-02 VITALS — BP 162/97 | HR 92

## 2012-11-02 DIAGNOSIS — I10 Essential (primary) hypertension: Secondary | ICD-10-CM

## 2012-11-02 NOTE — Progress Notes (Signed)
Patient walked in today asking for her lab results.  Results from her 9/24 visit were reviewed with patient.  Orthostatics were also checked.  Nothing remarkable seen.  Spent time with patient discussing the importance of changing positions slowly and staying hydrated.  Encouraged her to schedule her follow up appointment while here.

## 2012-11-02 NOTE — Telephone Encounter (Signed)
Patient walked in asking for her lab results.

## 2012-11-24 ENCOUNTER — Ambulatory Visit: Payer: Medicare Other

## 2012-12-06 ENCOUNTER — Other Ambulatory Visit: Payer: Self-pay | Admitting: Orthopaedic Surgery

## 2012-12-06 DIAGNOSIS — M549 Dorsalgia, unspecified: Secondary | ICD-10-CM

## 2012-12-14 ENCOUNTER — Other Ambulatory Visit: Payer: Medicare Other

## 2013-01-12 ENCOUNTER — Ambulatory Visit: Payer: Medicare Other | Attending: Internal Medicine | Admitting: Internal Medicine

## 2013-01-12 ENCOUNTER — Encounter: Payer: Self-pay | Admitting: Internal Medicine

## 2013-01-12 ENCOUNTER — Ambulatory Visit (HOSPITAL_COMMUNITY)
Admission: RE | Admit: 2013-01-12 | Discharge: 2013-01-12 | Disposition: A | Discharge status: Home / Self Care | Payer: Medicare Other | Source: Ambulatory Visit | Attending: Internal Medicine | Admitting: Internal Medicine

## 2013-01-12 VITALS — BP 148/87 | HR 101 | Temp 98.9°F | Resp 14 | Ht 65.0 in | Wt 164.8 lb

## 2013-01-12 DIAGNOSIS — M79645 Pain in left finger(s): Secondary | ICD-10-CM

## 2013-01-12 DIAGNOSIS — M25549 Pain in joints of unspecified hand: Secondary | ICD-10-CM | POA: Insufficient documentation

## 2013-01-12 DIAGNOSIS — I10 Essential (primary) hypertension: Secondary | ICD-10-CM

## 2013-01-12 DIAGNOSIS — M899 Disorder of bone, unspecified: Secondary | ICD-10-CM | POA: Insufficient documentation

## 2013-01-12 DIAGNOSIS — F411 Generalized anxiety disorder: Secondary | ICD-10-CM | POA: Insufficient documentation

## 2013-01-12 DIAGNOSIS — M79609 Pain in unspecified limb: Secondary | ICD-10-CM

## 2013-01-12 LAB — LIPID PANEL
Cholesterol: 286 mg/dL — ABNORMAL HIGH (ref 0–200)
HDL: 49 mg/dL (ref >39)
LDL Cholesterol: 199 mg/dL — ABNORMAL HIGH (ref 0–99)
Total CHOL/HDL Ratio: 5.8 Ratio
Triglycerides: 189 mg/dL — ABNORMAL HIGH (ref <150)
VLDL: 38 mg/dL (ref 0–40)

## 2013-01-12 MED ORDER — ATORVASTATIN CALCIUM 40 MG PO TABS: 40.0000 mg | ORAL_TABLET | Freq: Every day | ORAL | Status: DC

## 2013-01-12 MED ORDER — ALPRAZOLAM 0.25 MG PO TABS: 0.2500 mg | ORAL_TABLET | Freq: Every evening | ORAL | Status: DC | PRN

## 2013-01-12 MED ORDER — GEMFIBROZIL 600 MG PO TABS: 600.0000 mg | ORAL_TABLET | Freq: Two times a day (BID) | ORAL | Status: DC

## 2013-01-12 NOTE — Progress Notes (Signed)
Pt is here for a f/u for 3 months. Lt thumb feels broken; painful to strengthen finger x2 months.

## 2013-01-12 NOTE — Progress Notes (Signed)
Patient ID: Kristina Ayala, female   DOB: 08-25-1945, 66 y.o.   MRN: [id number]   CC:  HPI:  67 year old female presents with left thumb pain. She will do this for about 2 months she is unable to flex her thumb. She is unable to open containers with her left hand. She notices pain at the base of the thumb and she does that. She has been applying diclofenac jelly to help with the pain. She wants to rule out a fracture and see an orthopedic surgeon  She also stated that her daughter lost her job and she is very anxious and is requesting something for anxiety  Allergies  Allergen Reactions  . Doxycycline Diarrhea  . Minocycline Diarrhea  . Penicillins Rash   Past Medical History  Diagnosis Date  . Hypertension   . Arthritis   . Bursitis     left elbow  . Infectious colitis   . Clostridium difficile infection   . HLD (hyperlipidemia)   . Frequent falls    Current Outpatient Prescriptions on File Prior to Visit  Medication Sig Dispense Refill  . amLODipine (NORVASC) 5 MG tablet Take 1 tablet (5 mg total) by mouth daily.  90 tablet  1  . aspirin 81 MG tablet Take 81 mg by mouth 2 (two) times daily before a meal.      . Calcium Carbonate-Vitamin D (CALCIUM + D PO) Take 1 tablet by mouth daily.      . diclofenac sodium (VOLTAREN) 1 % GEL Apply topically as needed.      . hydrochlorothiazide (HYDRODIURIL) 25 MG tablet Take 1 tablet (25 mg total) by mouth daily.  90 tablet  5  . lisinopril (PRINIVIL,ZESTRIL) 40 MG tablet Take 1 tablet (40 mg total) by mouth daily.  90 tablet  5  . Magnesium 500 MG CAPS Take 1 capsule (500 mg total) by mouth daily.  30 capsule  1  . nabumetone (RELAFEN) 750 MG tablet Take 1 tablet (750 mg total) by mouth daily.  30 tablet  5  . potassium chloride (KLOR-CON 10) 10 MEQ tablet Take 1 tablet (10 mEq total) by mouth daily.  30 tablet  1  . venlafaxine XR (EFFEXOR-XR) 75 MG 24 hr capsule Take 1 capsule (75 mg total) by mouth daily.  30 capsule  5  . vitamin  B-12 (CYANOCOBALAMIN) 1000 MCG tablet Take 1,000 mcg by mouth daily.      . varenicline (CHANTIX STARTING MONTH PAK) 0.5 MG X 11 & 1 MG X 42 tablet Take one 0.5 mg tablet by mouth once daily for 3 days, then increase to one 0.5 mg tablet twice daily for 4 days, then increase to one 1 mg tablet twice daily.  53 tablet  0   No current facility-administered medications on file prior to visit.   Family History  Problem Relation Age of Onset  . Diverticulosis Mother   . Ovarian cancer Mother   . Pancreatic cancer Father   . Lung cancer Father   . Colon polyps Sister   . Colon cancer Neg Hx    History   Social History  . Marital Status: Single    Spouse Name: N/A    Number of Children: 2  . Years of Education: N/A   Occupational History  . RETIRED    Social History Main Topics  . Smoking status: Current Some Day Smoker -- 0.25 packs/day for 50 years    Types: Cigarettes  . Smokeless tobacco: Never Used  Comment: 2 cigarettes daily  . Alcohol Use: No  . Drug Use: No  . Sexual Activity: Not on file   Other Topics Concern  . Not on file   Social History Narrative   Daughter lives with patient.     Son lives in [place]   She is originally from [place], [place]          Review of Systems  Constitutional: Negative for fever, chills, diaphoresis, activity change, appetite change and fatigue.  HENT: Negative for ear pain, nosebleeds, congestion, facial swelling, rhinorrhea, neck pain, neck stiffness and ear discharge.   Eyes: Negative for pain, discharge, redness, itching and visual disturbance.  Respiratory: Negative for cough, choking, chest tightness, shortness of breath, wheezing and stridor.   Cardiovascular: Negative for chest pain, palpitations and leg swelling.  Gastrointestinal: Negative for abdominal distention.  Genitourinary: Negative for dysuria, urgency, frequency, hematuria, flank pain, decreased urine volume, difficulty urinating and dyspareunia.   Musculoskeletal: Negative for back pain, joint swelling, arthralgias and gait problem.  Neurological: Negative for dizziness, tremors, seizures, syncope, facial asymmetry, speech difficulty, weakness, light-headedness, numbness and headaches.  Hematological: Negative for adenopathy. Does not bruise/bleed easily.  Psychiatric/Behavioral: Negative for hallucinations, behavioral problems, confusion, dysphoric mood, decreased concentration and agitation.    Objective:   Filed Vitals:   01/12/13 0951  BP: 148/87  Pulse: 101  Temp: 98.9 F (37.2 C)  Resp: 14    Physical Exam  Constitutional: Appears well-developed and well-nourished. No distress.  HENT: Normocephalic. External right and left ear normal. Oropharynx is clear and moist.  Eyes: Conjunctivae and EOM are normal. PERRLA, no scleral icterus.  Neck: Normal ROM. Neck supple. No JVD. No tracheal deviation. No thyromegaly.  CVS: RRR, S1/S2 +, no murmurs, no gallops, no carotid bruit.  Pulmonary: Effort and breath sounds normal, no stridor, rhonchi, wheezes, rales.  Abdominal: Soft. BS +,  no distension, tenderness, rebound or guarding.  Musculoskeletal: Normal range of motion. No edema and no tenderness.  Lymphadenopathy: No lymphadenopathy noted, cervical, inguinal. Neuro: Alert. Normal reflexes, muscle tone coordination. No cranial nerve deficit. Skin: Skin is warm and dry. No rash noted. Not diaphoretic. No erythema. No pallor.  Psychiatric: Normal mood and affect. Behavior, judgment, thought content normal.   Lab Results  Component Value Date   WBC 5.8 10/20/2012   HGB 13.6 10/20/2012   HCT 40.1 10/20/2012   MCV 81.0 10/20/2012   PLT 271 10/20/2012   Lab Results  Component Value Date   CREATININE 0.73 10/20/2012   BUN 16 10/20/2012   NA 139 10/20/2012   K 4.1 10/20/2012   CL 101 10/20/2012   CO2 32 10/20/2012    Lab Results  Component Value Date   HGBA1C 6.0* 10/20/2012   Lipid Panel     Component Value Date/Time    CHOL 297* 10/20/2012 1145   TRIG 306* 10/20/2012 1145   HDL 49 10/20/2012 1145   CHOLHDL 6.1 10/20/2012 1145   VLDL 61* 10/20/2012 1145   LDLCALC 187* 10/20/2012 1145       Assessment and plan:   Patient Active Problem List   Diagnosis Date Noted  . Hyperlipidemia 09/28/2012  . COPD (chronic obstructive pulmonary disease) 05/22/2012  . Frequent falls 05/22/2012  . Tobacco use disorder 05/22/2012  . Osteoporosis, unspecified 05/22/2012  . Chest pain 11/12/2011  . Hypertension 08/04/2011       Left thumb pain Obtain pain plain x-rays rule out fracture Orthopedic referral  Anxiety Will prescribe low-dose Xanax at bedtime Patient  is already on Effexor Patient has a history of recurrent falls, therefore we'll be careful with benzodiazepines, and not increase them any further  Hypertriglyceridemia Triglycerides are 306 LDL is 187  Will increase Lipitor to 40  start Lopid 600 mg twice a day Repeat lipid panel, liver function and CK in followup visit  Followup in one month    The patient was given clear instructions to go to ER or return to medical center if symptoms don't improve, worsen or new problems develop. The patient verbalized understanding. The patient was told to call to get any lab results if not heard anything in the next week.

## 2013-01-15 ENCOUNTER — Telehealth: Payer: Self-pay | Admitting: Emergency Medicine

## 2013-01-15 NOTE — Telephone Encounter (Signed)
Pt called in requesting xray hand result.Negative report given

## 2013-01-16 ENCOUNTER — Telehealth: Payer: Self-pay | Admitting: *Deleted

## 2013-01-16 NOTE — Telephone Encounter (Signed)
Contacted pt to notify patient of the patient's triglycerides are 189, and LDL cholesterol is 199, continue current medications, will repeat lipid panel in 3 months. Pt complains that one medication prescribed gives her diarrhea. Suggested that pt continues with current medication and come back into the clinic if needed. Call completed effectively.

## 2013-01-17 ENCOUNTER — Telehealth: Payer: Self-pay | Admitting: *Deleted

## 2013-01-17 NOTE — Telephone Encounter (Signed)
Contacted pt for a call back. Pt complains of diarrhea from a medication. Requests to be switched to Lipitor. Left a note on Dr. Antionette Poles desk to contact pt acquiring her request.

## 2013-01-17 NOTE — Telephone Encounter (Signed)
Left a voicemail to be called back.

## 2013-01-17 NOTE — Telephone Encounter (Signed)
Contacted pt again to notify patient that she supposed to take both Lopid and Lipitor. Lopid does not cause diarrhea. Lipitor causes diarrhea. However the patient has been on Lipitor and has not had any side effects she should continue with both medications. Call completed effectively.

## 2013-01-29 ENCOUNTER — Ambulatory Visit: Payer: Medicare Other | Admitting: Sports Medicine

## 2013-02-14 ENCOUNTER — Ambulatory Visit: Payer: Medicare Other

## 2013-02-15 ENCOUNTER — Encounter: Payer: Self-pay | Admitting: Internal Medicine

## 2013-02-15 ENCOUNTER — Ambulatory Visit: Payer: Medicare Other | Attending: Internal Medicine | Admitting: Internal Medicine

## 2013-02-15 VITALS — BP 103/67 | HR 92 | Temp 98.4°F | Resp 16 | Ht 65.0 in | Wt 165.0 lb

## 2013-02-15 DIAGNOSIS — E785 Hyperlipidemia, unspecified: Secondary | ICD-10-CM | POA: Insufficient documentation

## 2013-02-15 DIAGNOSIS — I10 Essential (primary) hypertension: Secondary | ICD-10-CM | POA: Insufficient documentation

## 2013-02-15 NOTE — Progress Notes (Signed)
Patient ID: [name], female   DOB: [birth year], 68 y.o.   MRN: [id number] Patient Demographics  Janiesha Diehl, is a 68 y.o. female  KYH:[id number]  TDV:[id number]  DOB - 1945/07/07  Chief Complaint  Patient presents with  . Follow-up        Subjective:   Linsey Mathurin is a 68 y.o. female here today for a follow up visit. Patient is known to have hypertension, hyperlipidemia with hypertriglyceridemia, and arthritis, who continues to smoke about a quarter of a pack of cigarettes per day, here today for followup of cholesterol level. He has no complaint. He needs refill on some of his medications. Patient has No headache, No chest pain, No abdominal pain - No Nausea, No new weakness tingling or numbness, No Cough - SOB.  ALLERGIES: Allergies  Allergen Reactions  . Doxycycline Diarrhea  . Minocycline Diarrhea  . Penicillins Rash    PAST MEDICAL HISTORY: Past Medical History  Diagnosis Date  . Hypertension   . Arthritis   . Bursitis     left elbow  . Infectious colitis   . Clostridium difficile infection   . HLD (hyperlipidemia)   . Frequent falls     MEDICATIONS AT HOME: Prior to Admission medications   Medication Sig Start Date End Date Taking? Authorizing Provider  amLODipine (NORVASC) 5 MG tablet Take 1 tablet (5 mg total) by mouth daily. 10/20/12 10/20/13 Yes Iskra Barbera Setters, MD  aspirin 81 MG tablet Take 81 mg by mouth 2 (two) times daily before a meal.   Yes Historical Provider, MD  hydrochlorothiazide (HYDRODIURIL) 25 MG tablet Take 1 tablet (25 mg total) by mouth daily. 10/20/12  Yes Theodis Blaze, MD  lisinopril (PRINIVIL,ZESTRIL) 40 MG tablet Take 1 tablet (40 mg total) by mouth daily. 10/20/12  Yes Theodis Blaze, MD  potassium chloride (KLOR-CON 10) 10 MEQ tablet Take 1 tablet (10 mEq total) by mouth daily. 10/20/12  Yes Theodis Blaze, MD  ALPRAZolam Duanne Moron) 0.25 MG tablet Take 1 tablet (0.25 mg total) by mouth at bedtime as needed for anxiety.  01/12/13   Reyne Dumas, MD  atorvastatin (LIPITOR) 40 MG tablet Take 1 tablet (40 mg total) by mouth daily. 01/12/13   Reyne Dumas, MD  Calcium Carbonate-Vitamin D (CALCIUM + D PO) Take 1 tablet by mouth daily.    Historical Provider, MD  diclofenac sodium (VOLTAREN) 1 % GEL Apply topically as needed.    Historical Provider, MD  fluticasone (FLOVENT DISKUS) 50 MCG/BLIST diskus inhaler Inhale 1 puff into the lungs 2 (two) times daily.    Historical Provider, MD  gemfibrozil (LOPID) 600 MG tablet Take 1 tablet (600 mg total) by mouth 2 (two) times daily before a meal. 01/12/13   Reyne Dumas, MD  Magnesium 500 MG CAPS Take 1 capsule (500 mg total) by mouth daily. 10/20/12   Theodis Blaze, MD  nabumetone (RELAFEN) 750 MG tablet Take 1 tablet (750 mg total) by mouth daily. 10/20/12   Theodis Blaze, MD  varenicline (CHANTIX STARTING MONTH PAK) 0.5 MG X 11 & 1 MG X 42 tablet Take one 0.5 mg tablet by mouth once daily for 3 days, then increase to one 0.5 mg tablet twice daily for 4 days, then increase to one 1 mg tablet twice daily. 09/28/12   Doe-Hyun R Shawna Orleans, DO  venlafaxine XR (EFFEXOR-XR) 75 MG 24 hr capsule Take 1 capsule (75 mg total) by mouth daily. 10/20/12   Theodis Blaze, MD  vitamin B-12 (  CYANOCOBALAMIN) 1000 MCG tablet Take 1,000 mcg by mouth daily.    Historical Provider, MD     Objective:   Filed Vitals:   02/15/13 1128  BP: 103/67  Pulse: 92  Temp: 98.4 F (36.9 C)  TempSrc: Oral  Resp: 16  Height: 5\' 5"  (1.651 m)  Weight: 165 lb (74.844 kg)  SpO2: 99%    Exam General appearance : Awake, alert, not in any distress. Speech Clear. Not toxic looking HEENT: Atraumatic and Normocephalic, pupils equally reactive to light and accomodation Neck: supple, no JVD. No cervical lymphadenopathy.  Chest:Good air entry bilaterally, no added sounds  CVS: S1 S2 regular, no murmurs.  Abdomen: Bowel sounds present, Non tender and not distended with no gaurding, rigidity or rebound. Extremities:  B/L Lower Ext shows no edema, both legs are warm to touch Neurology: Awake alert, and oriented X 3, CN II-XII intact, Non focal Skin:No Rash Wounds:N/A   Data Review   CBC No results found for this basename: WBC, HGB, HCT, PLT, MCV, MCH, MCHC, RDW, NEUTRABS, LYMPHSABS, MONOABS, EOSABS, BASOSABS, BANDABS, BANDSABD,  in the last 168 hours  Chemistries   No results found for this basename: NA, K, CL, CO2, GLUCOSE, BUN, CREATININE, GFRCGP, CALCIUM, MG, AST, ALT, ALKPHOS, BILITOT,  in the last 168 hours ------------------------------------------------------------------------------------------------------------------ No results found for this basename: HGBA1C,  in the last 72 hours ------------------------------------------------------------------------------------------------------------------ No results found for this basename: CHOL, HDL, LDLCALC, TRIG, CHOLHDL, LDLDIRECT,  in the last 72 hours ------------------------------------------------------------------------------------------------------------------ No results found for this basename: TSH, T4TOTAL, FREET3, T3FREE, THYROIDAB,  in the last 72 hours ------------------------------------------------------------------------------------------------------------------ No results found for this basename: VITAMINB12, FOLATE, FERRITIN, TIBC, IRON, RETICCTPCT,  in the last 72 hours  Coagulation profile  No results found for this basename: INR, PROTIME,  in the last 168 hours    Assessment & Plan   1. Essential hypertension, benign Continue lisinopril 40 mg tablet by mouth daily Continue hydrochlorothiazide 25 mg tablet by mouth daily Continue amlodipine 5 mg tablet by mouth daily DASH diet Patient extensively counseled about smoking cessation Patient was extensively counseled on nutrition and exercise  2. Dyslipidemia 4 continue gemfibrozil 600 mg tablet by mouth twice a day for hypertriglyceridemia  Follow up in 3 months or when  necessary   The patient was given clear instructions to go to ER or return to medical center if symptoms don't improve, worsen or new problems develop. The patient verbalized understanding. The patient was told to call to get lab results if they haven't heard anything in the next week.    Angelica Chessman, MD, [place], [place], [place] and [place], [place]   02/15/2013, 12:26 PM

## 2013-02-15 NOTE — Patient Instructions (Signed)
Place hyperlipidemia patient instructions here.  

## 2013-02-15 NOTE — Progress Notes (Signed)
Pt is following up on her high cholesterol and high triglycerides.

## 2013-02-27 ENCOUNTER — Other Ambulatory Visit: Payer: Self-pay | Admitting: Emergency Medicine

## 2013-02-27 ENCOUNTER — Telehealth: Payer: Self-pay | Admitting: Emergency Medicine

## 2013-02-27 NOTE — Telephone Encounter (Signed)
Pt requesting Alprazolam 0.25 mg tab refilled

## 2013-03-06 ENCOUNTER — Ambulatory Visit (INDEPENDENT_AMBULATORY_CARE_PROVIDER_SITE_OTHER): Payer: Medicare Other | Admitting: Sports Medicine

## 2013-03-06 ENCOUNTER — Encounter: Payer: Self-pay | Admitting: Sports Medicine

## 2013-03-06 VITALS — BP 158/89 | Ht 65.0 in | Wt 165.0 lb

## 2013-03-06 DIAGNOSIS — M65312 Trigger thumb, left thumb: Secondary | ICD-10-CM | POA: Insufficient documentation

## 2013-03-06 DIAGNOSIS — M6530 Trigger finger, unspecified finger: Secondary | ICD-10-CM

## 2013-03-06 NOTE — Progress Notes (Signed)
   Subjective:    Patient ID: Kristina Ayala, female    DOB: Mar 20, 1945, 68 y.o.   MRN: [id number]  HPI: Pt is a 68yo female (right-hand dominant) who presents to clinic with complaint of left thumb pain for 3 months; she specifically would like a second opinion before having surgery. Pt states she first noticed pain in her thumb before Thanksgiving; she has had similar pain since then and also complains of being unable to turn knobs, open bottles, or grip objects due to pain in her thumb especially with bending of her interphalangeal joint. She states that she has great pain in her thumb without radiation into her hand or forearm with bending of that joint, and she will occasionally have the joint get "stuck" in flexion, with even more pain when she forces extension. She has tried no medications for this pain, but she has used gauze to wrap the joint (which caused itching / irritation of her skin) and wrapping it with a Bandaid (which has not helped).  Of note, pt saw her PCP at onset of her pain, who recommended a thumb split (she refused due to needing to use her hand too much to cook around Thanksgiving, etc). She had an appointment with Dr. Micheline Chapman in early January but cancelled in favor of seeing Dr. Sharol Given, who has seen her for various orthopedic issues in the past. She saw Dr. Sharol Given within the last few weeks and was recommended "some type of thumb surgery," with surgery scheduled on 02/27/13, but she canceled in favor of this appointment for a second opinion, as above.  Review of Systems: As above. Otherwise feels well. Denies fever / chills, coryza-type symptoms, SOB, chest or abdominal pain. She has no swelling, numbness / paralysis / weakness of her other hand muscles or arm / forearm.     Objective:   Physical Exam BP 158/89  Ht 5\' 5"  (1.651 m)  Wt 165 lb (74.844 kg)  BMI 27.46 kg/m2 Gen: well-appearing adult female in no acute distress; slightly anxious affect MSK:   Normal appearance  of bilateral hands and digits  No pain to palpation throughout bilateral hands, DIP, PIP, or MCP joints  No tenderness to palpation of IP or MCP joint of thumbs  Palpable, sliding nodule to left thumb mobile with flexion / extension of thumb  Exhibits reproducible triggering with significant pain on flexion of left thumb  Triggering easily reduced but very painful  Otherwise normal appearance, palpation, gross ROM, and strength of bilateral elbows and shoulders. Neurovascular:  Alert, oriented, no gross focal deficit  Intact sensation in bilateral hands and forearms to light touch  Strength 5/5 in grip bilaterally, though pt avoids movements of left thumb until specifically instructed  Strength otherwise 5/5 in bilateral wrists and elbows with flexion / extension  Distal pulses intact / symmetric in bilateral UE     Assessment & Plan:  Impression of left trigger finger, consistent with Dr. Jess Barters diagnosis per pt. Considered injection, though expect pt would be better benefited long-term with definitive surgery. Counseled pt on details of procedure for trigger release and strongly recommended follow-up with Dr. Sharol Given to reschedule surgery or rediscuss, per pt preference. Advised double-Bandaid splinting of thumb IP joint to prevent triggering especially while sleeping in the meantime. Otherwise f/u as needed.  The above was discussed in its entirety with attending Dr. Micheline Chapman.  Emmaline Kluver, MD PGY-2, [facility] 03/06/2013, 12:54 PM

## 2013-03-26 ENCOUNTER — Ambulatory Visit: Payer: Medicare Other | Attending: Internal Medicine | Admitting: Internal Medicine

## 2013-03-26 ENCOUNTER — Encounter: Payer: Self-pay | Admitting: Internal Medicine

## 2013-03-26 VITALS — BP 151/91 | HR 72 | Temp 99.3°F | Resp 16 | Ht 65.0 in | Wt 164.0 lb

## 2013-03-26 DIAGNOSIS — Z09 Encounter for follow-up examination after completed treatment for conditions other than malignant neoplasm: Secondary | ICD-10-CM | POA: Insufficient documentation

## 2013-03-26 DIAGNOSIS — E781 Pure hyperglyceridemia: Secondary | ICD-10-CM | POA: Insufficient documentation

## 2013-03-26 DIAGNOSIS — F17200 Nicotine dependence, unspecified, uncomplicated: Secondary | ICD-10-CM | POA: Insufficient documentation

## 2013-03-26 DIAGNOSIS — M129 Arthropathy, unspecified: Secondary | ICD-10-CM | POA: Insufficient documentation

## 2013-03-26 DIAGNOSIS — E785 Hyperlipidemia, unspecified: Secondary | ICD-10-CM | POA: Insufficient documentation

## 2013-03-26 DIAGNOSIS — J019 Acute sinusitis, unspecified: Secondary | ICD-10-CM | POA: Insufficient documentation

## 2013-03-26 DIAGNOSIS — Z7982 Long term (current) use of aspirin: Secondary | ICD-10-CM | POA: Insufficient documentation

## 2013-03-26 DIAGNOSIS — I10 Essential (primary) hypertension: Secondary | ICD-10-CM

## 2013-03-26 MED ORDER — LISINOPRIL 40 MG PO TABS: 40.0000 mg | ORAL_TABLET | Freq: Every day | ORAL | Status: DC

## 2013-03-26 MED ORDER — HYDROCHLOROTHIAZIDE 25 MG PO TABS: 25.0000 mg | ORAL_TABLET | Freq: Every day | ORAL | Status: DC

## 2013-03-26 MED ORDER — LORATADINE 10 MG PO TABS: 10.0000 mg | ORAL_TABLET | Freq: Every day | ORAL | Status: DC

## 2013-03-26 MED ORDER — AZITHROMYCIN 250 MG PO TABS: ORAL_TABLET | ORAL | Status: DC

## 2013-03-26 NOTE — Progress Notes (Signed)
Patient ID: Kristina Ayala, female   DOB: 09/08/1945, 68 y.o.   MRN: [id number]   Kristina Ayala, is a 68 y.o. female  VZC:[id number]  JOI:[id number]  DOB - 01-06-1946  Chief Complaint  Patient presents with  . Follow-up        Subjective:   Kristina Ayala is a 68 y.o. female here today for a follow up visit. Patient is known to have hypertension, hyperlipidemia with hypertriglyceridemia, and arthritis, who continues to smoke about a quarter of a pack of cigarettes per day. She is here today complaining of some cold symptoms and sinus pressure, associated with headache and subjective fever. She denies any contact with patient who has flu. She also coughs with some postnasal drip which prevents her from sleeping well at night. Her medications as listed below. She's allergic to penicillin Patient has No chest pain, No abdominal pain - No Nausea, No new weakness tingling or numbness, No Cough - SOB.  Problem  Acute Sinusitis    ALLERGIES: Allergies  Allergen Reactions  . Doxycycline Diarrhea  . Minocycline Diarrhea  . Penicillins Rash    PAST MEDICAL HISTORY: Past Medical History  Diagnosis Date  . Hypertension   . Arthritis   . Bursitis     left elbow  . Infectious colitis   . Clostridium difficile infection   . HLD (hyperlipidemia)   . Frequent falls     MEDICATIONS AT HOME: Prior to Admission medications   Medication Sig Start Date End Date Taking? Authorizing Provider  ALPRAZolam (XANAX) 0.25 MG tablet Take 1 tablet (0.25 mg total) by mouth at bedtime as needed for anxiety. 01/12/13  Yes Reyne Dumas, MD  amLODipine (NORVASC) 5 MG tablet Take 1 tablet (5 mg total) by mouth daily. 10/20/12 10/20/13 Yes Iskra Barbera Setters, MD  aspirin 81 MG tablet Take 81 mg by mouth 2 (two) times daily before a meal.   Yes Historical Provider, MD  atorvastatin (LIPITOR) 40 MG tablet Take 1 tablet (40 mg total) by mouth daily. 01/12/13  Yes Reyne Dumas, MD  Calcium  Carbonate-Vitamin D (CALCIUM + D PO) Take 1 tablet by mouth daily.   Yes Historical Provider, MD  diclofenac sodium (VOLTAREN) 1 % GEL Apply topically as needed.   Yes Historical Provider, MD  fluticasone (FLOVENT DISKUS) 50 MCG/BLIST diskus inhaler Inhale 1 puff into the lungs 2 (two) times daily.   Yes Historical Provider, MD  gemfibrozil (LOPID) 600 MG tablet Take 1 tablet (600 mg total) by mouth 2 (two) times daily before a meal. 01/12/13  Yes Reyne Dumas, MD  hydrochlorothiazide (HYDRODIURIL) 25 MG tablet Take 1 tablet (25 mg total) by mouth daily. 03/26/13  Yes Angelica Chessman, MD  lisinopril (PRINIVIL,ZESTRIL) 40 MG tablet Take 1 tablet (40 mg total) by mouth daily. 03/26/13  Yes Angelica Chessman, MD  Magnesium 500 MG CAPS Take 1 capsule (500 mg total) by mouth daily. 10/20/12  Yes Theodis Blaze, MD  nabumetone (RELAFEN) 750 MG tablet Take 1 tablet (750 mg total) by mouth daily. 10/20/12  Yes Theodis Blaze, MD  potassium chloride (KLOR-CON 10) 10 MEQ tablet Take 1 tablet (10 mEq total) by mouth daily. 10/20/12  Yes Theodis Blaze, MD  varenicline (CHANTIX STARTING MONTH PAK) 0.5 MG X 11 & 1 MG X 42 tablet Take one 0.5 mg tablet by mouth once daily for 3 days, then increase to one 0.5 mg tablet twice daily for 4 days, then increase to one 1 mg tablet twice daily. 09/28/12  Yes Doe-Hyun R Yoo, DO  venlafaxine XR (EFFEXOR-XR) 75 MG 24 hr capsule Take 1 capsule (75 mg total) by mouth daily. 10/20/12  Yes Theodis Blaze, MD  vitamin B-12 (CYANOCOBALAMIN) 1000 MCG tablet Take 1,000 mcg by mouth daily.   Yes Historical Provider, MD  azithromycin (ZITHROMAX) 250 MG tablet Take 2 tabs today, then 1 tab daily for 4 days from tomorrow 03/26/13   Angelica Chessman, MD  loratadine (CLARITIN) 10 MG tablet Take 1 tablet (10 mg total) by mouth daily. 03/26/13   Angelica Chessman, MD     Objective:   Filed Vitals:   03/26/13 1546  BP: 151/91  Pulse: 72  Temp: 99.3 F (37.4 C)  TempSrc: Oral  Resp: 16  Height:  5\' 5"  (1.651 m)  Weight: 164 lb (74.39 kg)  SpO2: 99%    Exam General appearance : Awake, alert, not in any distress. Speech Clear. Not toxic looking. Afebrile HEENT: Tenderness over the maxillary sinuses. Atraumatic and Normocephalic, pupils equally reactive to light and accomodation Neck: supple, no JVD. No cervical lymphadenopathy.  Chest:Good air entry bilaterally, no added sounds  CVS: S1 S2 regular, no murmurs.  Abdomen: Bowel sounds present, Non tender and not distended with no gaurding, rigidity or rebound. Extremities: B/L Lower Ext shows no edema, both legs are warm to touch Neurology: Awake alert, and oriented X 3, CN II-XII intact, Non focal Skin:No Rash Wounds:N/A  Data Review  Assessment & Plan   1. Essential hypertension, benign  - hydrochlorothiazide (HYDRODIURIL) 25 MG tablet; Take 1 tablet (25 mg total) by mouth daily.  Dispense: 90 tablet; Refill: 3 - lisinopril (PRINIVIL,ZESTRIL) 40 MG tablet; Take 1 tablet (40 mg total) by mouth daily.  Dispense: 90 tablet; Refill: 3  2. Dyslipidemia Continue gemfibrozil 600 mg tablet by mouth twice a day atorvastatin 40 mg tablet by mouth daily Patient has been counseled extensively on nutrition and exercise  3. Acute sinusitis  - azithromycin (ZITHROMAX) 250 MG tablet; Take 2 tabs today, then 1 tab daily for 4 days from tomorrow  Dispense: 6 tablet; Refill: 0 - loratadine (CLARITIN) 10 MG tablet; Take 1 tablet (10 mg total) by mouth daily.  Dispense: 30 tablet; Refill: 2  Patient was counseled extensively on smoking cessation  Return in about 3 months (around 06/26/2013), or if symptoms worsen or fail to improve, for Routine Follow Up, Hemoglobin A1C and Follow up, CBG.  The patient was given clear instructions to go to ER or return to medical center if symptoms don't improve, worsen or new problems develop. The patient verbalized understanding. The patient was told to call to get lab results if they haven't heard anything  in the next week.    Angelica Chessman, MD, [place], [place], [place] [facility] [place], [place]   03/26/2013, 4:30 PM

## 2013-03-26 NOTE — Progress Notes (Signed)
Since last Tuesday pt has had a cold and a sinus infection.

## 2013-03-26 NOTE — Patient Instructions (Signed)
Hypertension  As your heart beats, it forces blood through your arteries. This force is your blood pressure. If the pressure is too high, it is called hypertension (HTN) or high blood pressure. HTN is dangerous because you may have it and not know it. High blood pressure may mean that your heart has to work harder to pump blood. Your arteries may be narrow or stiff. The extra work puts you at risk for heart disease, stroke, and other problems.   Blood pressure consists of two numbers, a higher number over a lower, 110/72, for example. It is stated as "110 over 72." The ideal is below 120 for the top number (systolic) and under 80 for the bottom (diastolic). Write down your blood pressure today.  You should pay close attention to your blood pressure if you have certain conditions such as:   Heart failure.   Prior heart attack.   Diabetes   Chronic kidney disease.   Prior stroke.   Multiple risk factors for heart disease.  To see if you have HTN, your blood pressure should be measured while you are seated with your arm held at the level of the heart. It should be measured at least twice. A one-time elevated blood pressure reading (especially in the Emergency Department) does not mean that you need treatment. There may be conditions in which the blood pressure is different between your right and left arms. It is important to see your caregiver soon for a recheck.  Most people have essential hypertension which means that there is not a specific cause. This type of high blood pressure may be lowered by changing lifestyle factors such as:   Stress.   Smoking.   Lack of exercise.   Excessive weight.   Drug/tobacco/alcohol use.   Eating less salt.  Most people do not have symptoms from high blood pressure until it has caused damage to the body. Effective treatment can often prevent, delay or reduce that damage.  TREATMENT    When a cause has been identified, treatment for high blood pressure is directed at the cause. There are a large number of medications to treat HTN. These fall into several categories, and your caregiver will help you select the medicines that are best for you. Medications may have side effects. You should review side effects with your caregiver.  If your blood pressure stays high after you have made lifestyle changes or started on medicines,    Your medication(s) may need to be changed.   Other problems may need to be addressed.   Be certain you understand your prescriptions, and know how and when to take your medicine.   Be sure to follow up with your caregiver within the time frame advised (usually within two weeks) to have your blood pressure rechecked and to review your medications.   If you are taking more than one medicine to lower your blood pressure, make sure you know how and at what times they should be taken. Taking two medicines at the same time can result in blood pressure that is too low.  SEEK IMMEDIATE MEDICAL CARE IF:   You develop a severe headache, blurred or changing vision, or confusion.   You have unusual weakness or numbness, or a faint feeling.   You have severe chest or abdominal pain, vomiting, or breathing problems.  MAKE SURE YOU:    Understand these instructions.   Will watch your condition.   Will get help right away if you are not doing well   or get worse.  Document Released: 01/11/2005 Document Revised: 04/05/2011 Document Reviewed: 09/01/2007  ExitCare Patient Information 2014 [employer]  DASH Diet   The DASH diet stands for "Dietary Approaches to Stop Hypertension." It is a healthy eating plan that has been shown to reduce high blood pressure (hypertension) in as little as 14 days, while also possibly providing other significant health benefits. These other health benefits include reducing the risk of breast cancer after menopause and reducing the risk of type 2 diabetes, heart disease, colon cancer, and stroke. Health benefits also include weight loss and slowing kidney failure in patients with chronic kidney disease.   DIET GUIDELINES   Limit salt (sodium). Your diet should contain less than 1500 mg of sodium daily.   Limit refined or processed carbohydrates. Your diet should include mostly whole grains. Desserts and added sugars should be used sparingly.   Include small amounts of heart-healthy fats. These types of fats include nuts, oils, and tub margarine. Limit saturated and trans fats. These fats have been shown to be harmful in the body.  CHOOSING FOODS   The following food groups are based on a 2000 calorie diet. See your Registered Dietitian for individual calorie needs.  Grains and Grain Products (6 to 8 servings daily)   Eat More Often: Whole-wheat bread, brown rice, whole-grain or wheat pasta, quinoa, popcorn without added fat or salt (air popped).   Eat Less Often: White bread, white pasta, white rice, cornbread.  Vegetables (4 to 5 servings daily)   Eat More Often: Fresh, frozen, and canned vegetables. Vegetables may be raw, steamed, roasted, or grilled with a minimal amount of fat.   Eat Less Often/Avoid: Creamed or fried vegetables. Vegetables in a cheese sauce.  Fruit (4 to 5 servings daily)   Eat More Often: All fresh, canned (in natural juice), or frozen fruits. Dried fruits without added sugar. One hundred percent fruit juice ( cup [237 mL] daily).   Eat Less Often: Dried fruits with added sugar. Canned fruit in light or heavy syrup.   Lean Meats, Fish, and Poultry (2 servings or less daily. One serving is 3 to 4 oz [85-114 g]).   Eat More Often: Ninety percent or leaner ground beef, tenderloin, sirloin. Round cuts of beef, chicken breast, turkey breast. All fish. Grill, bake, or broil your meat. Nothing should be fried.   Eat Less Often/Avoid: Fatty cuts of meat, turkey, or chicken leg, thigh, or wing. Fried cuts of meat or fish.  Dairy (2 to 3 servings)   Eat More Often: Low-fat or fat-free milk, low-fat plain or light yogurt, reduced-fat or part-skim cheese.   Eat Less Often/Avoid: Milk (whole, 2%).Whole milk yogurt. Full-fat cheeses.  Nuts, Seeds, and Legumes (4 to 5 servings per week)   Eat More Often: All without added salt.   Eat Less Often/Avoid: Salted nuts and seeds, canned beans with added salt.  Fats and Sweets (limited)   Eat More Often: Vegetable oils, tub margarines without trans fats, sugar-free gelatin. Mayonnaise and salad dressings.   Eat Less Often/Avoid: Coconut oils, palm oils, butter, stick margarine, cream, half and half, cookies, candy, pie.  FOR MORE INFORMATION  The Dash Diet Eating Plan: www.dashdiet.org  Document Released: 12/31/2010 Document Revised: 04/05/2011 Document Reviewed: 12/31/2010  [employer] Patient Information 2014 [employer].

## 2013-05-11 ENCOUNTER — Telehealth: Payer: Self-pay

## 2013-05-11 ENCOUNTER — Other Ambulatory Visit: Payer: Self-pay

## 2013-05-11 DIAGNOSIS — Z1231 Encounter for screening mammogram for malignant neoplasm of breast: Secondary | ICD-10-CM

## 2013-05-11 MED ORDER — VENLAFAXINE HCL ER 75 MG PO CP24: 75.0000 mg | ORAL_CAPSULE | Freq: Every day | ORAL | Status: DC

## 2013-05-11 NOTE — Telephone Encounter (Signed)
Patient called needing her effexor refilled  Prescription refilled

## 2013-05-16 ENCOUNTER — Ambulatory Visit
Admission: RE | Admit: 2013-05-16 | Discharge: 2013-05-16 | Disposition: A | Discharge status: Home / Self Care | Payer: Self-pay | Source: Ambulatory Visit

## 2013-05-16 ENCOUNTER — Encounter (INDEPENDENT_AMBULATORY_CARE_PROVIDER_SITE_OTHER): Payer: Self-pay

## 2013-05-16 DIAGNOSIS — Z1231 Encounter for screening mammogram for malignant neoplasm of breast: Secondary | ICD-10-CM

## 2013-06-25 ENCOUNTER — Ambulatory Visit: Payer: Medicare Other | Admitting: Internal Medicine

## 2013-06-28 ENCOUNTER — Ambulatory Visit: Payer: Medicare Other | Attending: Internal Medicine | Admitting: Internal Medicine

## 2013-06-28 ENCOUNTER — Encounter: Payer: Self-pay | Admitting: Internal Medicine

## 2013-06-28 VITALS — BP 145/89 | HR 91 | Temp 99.0°F | Resp 16 | Ht 65.0 in | Wt 166.0 lb

## 2013-06-28 DIAGNOSIS — I10 Essential (primary) hypertension: Secondary | ICD-10-CM | POA: Insufficient documentation

## 2013-06-28 DIAGNOSIS — E785 Hyperlipidemia, unspecified: Secondary | ICD-10-CM | POA: Insufficient documentation

## 2013-06-28 LAB — MAGNESIUM: Magnesium: 2.1 mg/dL (ref 1.5–2.5)

## 2013-06-28 LAB — LIPID PANEL
Cholesterol: 162 mg/dL (ref 0–200)
HDL: 42 mg/dL (ref >39)
LDL Cholesterol: 93 mg/dL (ref 0–99)
Total CHOL/HDL Ratio: 3.9 Ratio
Triglycerides: 136 mg/dL (ref <150)
VLDL: 27 mg/dL (ref 0–40)

## 2013-06-28 MED ORDER — VITAMIN B-12 1000 MCG PO TABS: 1000.0000 ug | ORAL_TABLET | Freq: Every day | ORAL | Status: DC

## 2013-06-28 MED ORDER — MAGNESIUM 500 MG PO CAPS: 1.0000 | ORAL_CAPSULE | Freq: Every day | ORAL | Status: DC

## 2013-06-28 MED ORDER — CALCIUM CITRATE-VITAMIN D 315-200 MG-UNIT PO TABS: 1.0000 | ORAL_TABLET | Freq: Every day | ORAL | Status: DC

## 2013-06-28 NOTE — Patient Instructions (Signed)

## 2013-06-28 NOTE — Progress Notes (Signed)
Pt here for 3 mnth f/u htn,cholesterol,hyperlipidemia Denies needing medication refills BP-145/89 91

## 2013-07-03 ENCOUNTER — Telehealth: Payer: Self-pay | Admitting: Emergency Medicine

## 2013-07-03 NOTE — Telephone Encounter (Signed)
Message copied by Ricci Barker on Tue Jul 03, 2013  5:22 PM ------      Message from: Tresa Garter      Created: Fri Jun 29, 2013  5:20 PM       Please inform patient that her laboratory tests results came back normal and her cholesterol level has improved significantly. We recommend that she stop this one of the cholesterol medicine named gemfibrozil, continue atorvastatin for now. Continue diet control and regular physical exercise ------

## 2013-07-03 NOTE — Telephone Encounter (Signed)
Pt given lab results  with medication instructions to stop taking Gemfibrozil but continue taking Atorvastatin with diet/exercise control. Pt verbalized understanding

## 2013-07-24 ENCOUNTER — Ambulatory Visit (HOSPITAL_COMMUNITY): Payer: Medicare Other

## 2013-07-24 ENCOUNTER — Emergency Department (HOSPITAL_COMMUNITY)
Admission: EM | Admit: 2013-07-24 | Discharge: 2013-07-24 | Disposition: A | Discharge status: Home / Self Care | Payer: Medicare Other | Attending: Emergency Medicine | Admitting: Emergency Medicine

## 2013-07-24 ENCOUNTER — Encounter (HOSPITAL_COMMUNITY): Payer: Self-pay | Admitting: Emergency Medicine

## 2013-07-24 DIAGNOSIS — Z9181 History of falling: Secondary | ICD-10-CM | POA: Insufficient documentation

## 2013-07-24 DIAGNOSIS — I10 Essential (primary) hypertension: Secondary | ICD-10-CM | POA: Insufficient documentation

## 2013-07-24 DIAGNOSIS — Z8619 Personal history of other infectious and parasitic diseases: Secondary | ICD-10-CM | POA: Insufficient documentation

## 2013-07-24 DIAGNOSIS — K5900 Constipation, unspecified: Secondary | ICD-10-CM | POA: Insufficient documentation

## 2013-07-24 DIAGNOSIS — M129 Arthropathy, unspecified: Secondary | ICD-10-CM | POA: Insufficient documentation

## 2013-07-24 DIAGNOSIS — E785 Hyperlipidemia, unspecified: Secondary | ICD-10-CM | POA: Insufficient documentation

## 2013-07-24 DIAGNOSIS — Z79899 Other long term (current) drug therapy: Secondary | ICD-10-CM | POA: Insufficient documentation

## 2013-07-24 DIAGNOSIS — Z880 Allergy status to penicillin: Secondary | ICD-10-CM | POA: Insufficient documentation

## 2013-07-24 DIAGNOSIS — R109 Unspecified abdominal pain: Secondary | ICD-10-CM

## 2013-07-24 DIAGNOSIS — R1032 Left lower quadrant pain: Secondary | ICD-10-CM

## 2013-07-24 DIAGNOSIS — F17200 Nicotine dependence, unspecified, uncomplicated: Secondary | ICD-10-CM | POA: Insufficient documentation

## 2013-07-24 LAB — COMPREHENSIVE METABOLIC PANEL
ALBUMIN: 3.8 g/dL (ref 3.5–5.2)
ALK PHOS: 76 U/L (ref 39–117)
ALT: 32 U/L (ref 0–35)
AST: 25 U/L (ref 0–37)
BILIRUBIN TOTAL: 0.3 mg/dL (ref 0.3–1.2)
BUN: 16 mg/dL (ref 6–23)
CHLORIDE: 103 meq/L (ref 96–112)
CO2: 27 mEq/L (ref 19–32)
Calcium: 9.1 mg/dL (ref 8.4–10.5)
Creatinine, Ser: 0.83 mg/dL (ref 0.50–1.10)
GFR calc Af Amer: 82 mL/min — ABNORMAL LOW (ref >90)
GFR calc non Af Amer: 70 mL/min — ABNORMAL LOW (ref >90)
Glucose, Bld: 104 mg/dL — ABNORMAL HIGH (ref 70–99)
POTASSIUM: 3.9 meq/L (ref 3.7–5.3)
SODIUM: 143 meq/L (ref 137–147)
Total Protein: 7 g/dL (ref 6.0–8.3)

## 2013-07-24 LAB — URINALYSIS, ROUTINE W REFLEX MICROSCOPIC
BILIRUBIN URINE: NEGATIVE
GLUCOSE, UA: NEGATIVE mg/dL
Hgb urine dipstick: NEGATIVE
KETONES UR: NEGATIVE mg/dL
LEUKOCYTES UA: NEGATIVE
Nitrite: NEGATIVE
PH: 7 (ref 5.0–8.0)
Protein, ur: NEGATIVE mg/dL
Specific Gravity, Urine: 1.011 (ref 1.005–1.030)
Urobilinogen, UA: 0.2 mg/dL (ref 0.0–1.0)

## 2013-07-24 LAB — CBC WITH DIFFERENTIAL/PLATELET
BASOS ABS: 0 10*3/uL (ref 0.0–0.1)
BASOS PCT: 1 % (ref 0–1)
Eosinophils Absolute: 0.3 10*3/uL (ref 0.0–0.7)
Eosinophils Relative: 4 % (ref 0–5)
HCT: 39.7 % (ref 36.0–46.0)
Hemoglobin: 13 g/dL (ref 12.0–15.0)
Lymphocytes Relative: 49 % — ABNORMAL HIGH (ref 12–46)
Lymphs Abs: 3.1 10*3/uL (ref 0.7–4.0)
MCH: 26.8 pg (ref 26.0–34.0)
MCHC: 32.7 g/dL (ref 30.0–36.0)
MCV: 81.9 fL (ref 78.0–100.0)
Monocytes Absolute: 0.7 10*3/uL (ref 0.1–1.0)
Monocytes Relative: 11 % (ref 3–12)
NEUTROS ABS: 2.2 10*3/uL (ref 1.7–7.7)
NEUTROS PCT: 35 % — AB (ref 43–77)
Platelets: 239 10*3/uL (ref 150–400)
RBC: 4.85 MIL/uL (ref 3.87–5.11)
RDW: 14.7 % (ref 11.5–15.5)
WBC: 6.2 10*3/uL (ref 4.0–10.5)

## 2013-07-24 LAB — LIPASE, BLOOD: Lipase: 75 U/L — ABNORMAL HIGH (ref 11–59)

## 2013-07-24 MED ORDER — ONDANSETRON 4 MG PO TBDP: 4.0000 mg | ORAL_TABLET | Freq: Three times a day (TID) | ORAL | Status: DC | PRN

## 2013-07-24 MED ORDER — POLYETHYLENE GLYCOL 3350 17 GM/SCOOP PO POWD: 17.0000 g | Freq: Every day | ORAL | Status: DC

## 2013-07-24 MED ORDER — MORPHINE SULFATE 4 MG/ML IJ SOLN
4.0000 mg | Freq: Once | INTRAMUSCULAR | Status: AC
  Administered 2013-07-24: 4 mg via INTRAVENOUS
  Filled 2013-07-24: qty 1

## 2013-07-24 MED ORDER — IOHEXOL 300 MG/ML  SOLN
50.0000 mL | Freq: Once | INTRAMUSCULAR | Status: AC | PRN
  Administered 2013-07-24: 50 mL via ORAL

## 2013-07-24 MED ORDER — ONDANSETRON HCL 4 MG/2ML IJ SOLN
4.0000 mg | Freq: Once | INTRAMUSCULAR | Status: AC
  Administered 2013-07-24: 4 mg via INTRAVENOUS
  Filled 2013-07-24: qty 2

## 2013-07-24 MED ORDER — HYDROCODONE-ACETAMINOPHEN 5-325 MG PO TABS: 1.0000 | ORAL_TABLET | ORAL | Status: DC | PRN

## 2013-07-24 MED ORDER — IOHEXOL 300 MG/ML  SOLN
100.0000 mL | Freq: Once | INTRAMUSCULAR | Status: AC | PRN
  Administered 2013-07-24: 100 mL via INTRAVENOUS

## 2013-07-24 NOTE — Discharge Instructions (Signed)
Take Vicodin as needed for pain. Take zofran as needed for nausea. Take Miralax daily for bowel movements. Refer to attached documents for more information. Follow up with your doctor. Return to the ED with worsening or concerning symptoms.

## 2013-07-24 NOTE — ED Notes (Signed)
Pt is c/o pain to her lower left side  Pt states the pain started on Sunday  Pt states she has tried tums, tea, ibuprofen, etc without relief  Pt states her stomach has been gurgling but only on the left side  Pt denies N/V/D  Pt states last BM was yesterday

## 2013-07-24 NOTE — ED Provider Notes (Signed)
CSN: [id number]     Arrival date & time 07/24/13  0618 History   First MD Initiated Contact with Patient 07/24/13 0636     Chief Complaint  Patient presents with  . Abdominal Pain     (Consider location/radiation/quality/duration/timing/severity/associated sxs/prior Treatment) HPI Comments: Patient is a 68 year old female with a past medical history of hypertension who presents with abdominal pain for the past 2 days. The pain is located in her LLQ and radiates around to her left flank. The pain is described as aching and severe. The pain started gradually and progressively worsened since the onset. No alleviating/aggravating factors. The patient has tried ibuprofen for symptoms without relief. Associated symptoms include constipation. Patient denies fever, headache, NVD, chest pain, SOB, dysuria. Patient reports having a diagnosis of "diverticulosis" on her last colonoscopy.    Past Medical History  Diagnosis Date  . Hypertension   . Arthritis   . Bursitis     left elbow  . Infectious colitis   . Clostridium difficile infection   . HLD (hyperlipidemia)   . Frequent falls    Past Surgical History  Procedure Laterality Date  . Tonsillectomy    . Orif proximal tibial plateau fracture Left 02/19/2012  . Polypectomy    . Colonoscopy     Family History  Problem Relation Age of Onset  . Diverticulosis Mother   . Ovarian cancer Mother   . Pancreatic cancer Father   . Lung cancer Father   . Colon polyps Sister   . Colon cancer Neg Hx    History  Substance Use Topics  . Smoking status: Current Some Day Smoker -- 0.25 packs/day for 50 years    Types: Cigarettes  . Smokeless tobacco: Never Used     Comment: 2 cigarettes daily  . Alcohol Use: No   OB History   Grav Para Term Preterm Abortions TAB SAB Ect Mult Living                 Review of Systems  Constitutional: Negative for fever, chills and fatigue.  HENT: Negative for trouble swallowing.   Eyes: Negative for visual  disturbance.  Respiratory: Negative for shortness of breath.   Cardiovascular: Negative for chest pain and palpitations.  Gastrointestinal: Positive for abdominal pain and constipation. Negative for nausea, vomiting and diarrhea.  Genitourinary: Negative for dysuria and difficulty urinating.  Musculoskeletal: Negative for arthralgias and neck pain.  Skin: Negative for color change.  Neurological: Negative for dizziness and weakness.  Psychiatric/Behavioral: Negative for dysphoric mood.      Allergies  Doxycycline; Minocycline; and Penicillins  Home Medications   Prior to Admission medications   Medication Sig Start Date End Date Taking? Authorizing Provider  amLODipine (NORVASC) 5 MG tablet Take 1 tablet (5 mg total) by mouth daily. 10/20/12 10/20/13 Yes Iskra Barbera Setters, MD  atorvastatin (LIPITOR) 40 MG tablet Take 1 tablet (40 mg total) by mouth daily. 01/12/13  Yes Reyne Dumas, MD  calcium citrate-vitamin D (CALCIUM + D) 315-200 MG-UNIT per tablet Take 1 tablet by mouth daily. 06/28/13  Yes Angelica Chessman, MD  hydrochlorothiazide (HYDRODIURIL) 25 MG tablet Take 1 tablet (25 mg total) by mouth daily. 03/26/13  Yes Angelica Chessman, MD  lisinopril (PRINIVIL,ZESTRIL) 40 MG tablet Take 1 tablet (40 mg total) by mouth daily. 03/26/13  Yes Angelica Chessman, MD  Magnesium 500 MG CAPS Take 1 capsule (500 mg total) by mouth daily. 06/28/13  Yes Angelica Chessman, MD  potassium chloride (KLOR-CON 10) 10 MEQ tablet  Take 1 tablet (10 mEq total) by mouth daily. 10/20/12  Yes Theodis Blaze, MD  venlafaxine XR (EFFEXOR-XR) 75 MG 24 hr capsule Take 1 capsule (75 mg total) by mouth daily. 05/11/13  Yes Angelica Chessman, MD  vitamin B-12 (CYANOCOBALAMIN) 1000 MCG tablet Take 1 tablet (1,000 mcg total) by mouth daily. 06/28/13  Yes Angelica Chessman, MD   BP 155/89  Pulse 88  Temp(Src) 98.7 F (37.1 C) (Oral)  Ht 5\' 5"  (1.651 m)  Wt 163 lb 8 oz (74.163 kg)  BMI 27.21 kg/m2  SpO2 100% Physical Exam   Nursing note and vitals reviewed. Constitutional: She is oriented to person, place, and time. She appears well-developed and well-nourished. No distress.  HENT:  Head: Normocephalic and atraumatic.  Eyes: Conjunctivae and EOM are normal.  Neck: Normal range of motion.  Cardiovascular: Normal rate and regular rhythm.  Exam reveals no gallop and no friction rub.   No murmur heard. Pulmonary/Chest: Effort normal and breath sounds normal. She has no wheezes. She has no rales. She exhibits no tenderness.  Abdominal: Soft. She exhibits no distension. There is tenderness. There is no rebound and no guarding.  LLQ tenderness to palpation. No other focal tenderness or peritoneal signs.   Musculoskeletal: Normal range of motion.  Neurological: She is alert and oriented to person, place, and time. Coordination normal.  Speech is goal-oriented. Moves limbs without ataxia.   Skin: Skin is warm and dry.  Psychiatric: She has a normal mood and affect. Her behavior is normal.    ED Course  Procedures (including critical care time) Labs Review Labs Reviewed  CBC WITH DIFFERENTIAL - Abnormal; Notable for the following:    Neutrophils Relative % 35 (*)    Lymphocytes Relative 49 (*)    All other components within normal limits  COMPREHENSIVE METABOLIC PANEL - Abnormal; Notable for the following:    Glucose, Bld 104 (*)    GFR calc non Af Amer 70 (*)    GFR calc Af Amer 82 (*)    All other components within normal limits  LIPASE, BLOOD - Abnormal; Notable for the following:    Lipase 75 (*)    All other components within normal limits  URINALYSIS, ROUTINE W REFLEX MICROSCOPIC    Imaging Review Ct Abdomen Pelvis W Contrast  07/24/2013   CLINICAL DATA:  Abdominal pain, distention, possible diverticulitis, history of C diff  EXAM: CT ABDOMEN AND PELVIS WITH CONTRAST  TECHNIQUE: Multidetector CT imaging of the abdomen and pelvis was performed using the standard protocol following bolus  administration of intravenous contrast.  CONTRAST:  150mL OMNIPAQUE IOHEXOL 300 MG/ML  SOLN  COMPARISON:  08/03/2011  FINDINGS: Scattered nodularity at the lung bases, measuring up to 3-4 mm (series 6/images 2, 5, and 10), unchanged from 2013 and benign.  Liver, spleen, pancreas, and adrenal glands are within normal limits.  Gallbladder is unremarkable. No intrahepatic or extrahepatic ductal dilatation.  Left kidney is within normal limits. Mild right hydronephrosis versus prominent renal pelvis.  No evidence of bowel obstruction. Normal appendix. Colonic diverticulosis, without associated inflammatory changes.  Atherosclerotic calcifications of the abdominal aorta and branch vessels.  No abdominopelvic ascites.  No suspicious abdominopelvic lymphadenopathy.  Uterus and bilateral ovaries are unremarkable.  No ureteral or bladder calculi.  Bladder is within normal limits.  Degenerative changes of the visualized thoracolumbar spine.  IMPRESSION: No evidence of bowel obstruction.  Normal appendix.  Colonic diverticulosis, without evidence of diverticulitis.  Mild right hydronephrosis versus prominent renal pelvis.  However, no obstructing calculus is evident by CT. Correlate for hematuria; if present, consider urologic consultation as clinically warranted.   Electronically Signed   By: Julian Hy M.D.   On: 07/24/2013 08:58     EKG Interpretation None      MDM   Final diagnoses:  Left lower quadrant pain  Left flank pain    7:15 AM Labs and urinalysis pending. Patient will have morphine and zofran for symptoms. Vitals stable and patient afebrile. Patient will have CT abdomen pelvis with contrast for possible diverticulitis.   10:32 AM Patient's CT, labs and urinalysis unremarkable for acute changes. Patient will be discharged with Vicodin, zofran and miralax. Patient advised to follow up with PCP and return to the ED with worsening or concerning symptoms. Vitals stable and patient afebrile.      Alvina Chou, [place] 07/24/13 [phone]

## 2013-07-25 NOTE — ED Provider Notes (Signed)
Medical screening examination/treatment/procedure(s) were conducted as a shared visit with non-physician practitioner(s) and myself.  I personally evaluated the patient during the encounter.   EKG Interpretation None      Kristina Ayala is a 68 y.o. female here with abdominal pain. LLQ pain radiate to the flank for the past 2 days. No urinary symptoms. Mild LLQ tenderness on exam. CT showed possible R hydro. UA showed no hematuria. Pain on L side. I doubt renal colic. Will d/c home with pain meds, outpatient f/u.    Wandra Arthurs, MD 07/25/13 [phone]

## 2013-07-26 ENCOUNTER — Encounter: Payer: Self-pay | Admitting: Internal Medicine

## 2013-07-26 ENCOUNTER — Ambulatory Visit: Payer: Medicare Other | Attending: Internal Medicine | Admitting: Internal Medicine

## 2013-07-26 VITALS — BP 144/88 | HR 97 | Temp 98.6°F | Resp 16 | Ht 65.0 in | Wt 167.0 lb

## 2013-07-26 DIAGNOSIS — K5730 Diverticulosis of large intestine without perforation or abscess without bleeding: Secondary | ICD-10-CM | POA: Diagnosis not present

## 2013-07-26 DIAGNOSIS — R197 Diarrhea, unspecified: Secondary | ICD-10-CM | POA: Insufficient documentation

## 2013-07-26 DIAGNOSIS — I10 Essential (primary) hypertension: Secondary | ICD-10-CM | POA: Diagnosis not present

## 2013-07-26 LAB — POCT GLYCOSYLATED HEMOGLOBIN (HGB A1C): Hemoglobin A1C: 5.7

## 2013-07-26 MED ORDER — METRONIDAZOLE 500 MG PO TABS: 500.0000 mg | ORAL_TABLET | Freq: Three times a day (TID) | ORAL | Status: DC

## 2013-07-26 MED ORDER — POTASSIUM CHLORIDE ER 10 MEQ PO TBCR: 10.0000 meq | EXTENDED_RELEASE_TABLET | Freq: Every day | ORAL | Status: DC

## 2013-07-26 MED ORDER — CIPROFLOXACIN HCL 500 MG PO TABS: 500.0000 mg | ORAL_TABLET | Freq: Two times a day (BID) | ORAL | Status: DC

## 2013-07-26 NOTE — Progress Notes (Signed)
Pt is here following up on her HTN  Pt was in the ED recently for severe abdominal pain.

## 2013-07-26 NOTE — Patient Instructions (Signed)
Diarrhea  Diarrhea is frequent loose and watery bowel movements. It can cause you to feel weak and dehydrated. Dehydration can cause you to become tired and thirsty, have a dry mouth, and have decreased urination that often is dark yellow. Diarrhea is a sign of another problem, most often an infection that will not last long. In most cases, diarrhea typically lasts 2-3 days. However, it can last longer if it is a sign of something more serious. It is important to treat your diarrhea as directed by your caregive to lessen or prevent future episodes of diarrhea.  CAUSES   Some common causes include:   Gastrointestinal infections caused by viruses, bacteria, or parasites.   Food poisoning or food allergies.   Certain medicines, such as antibiotics, chemotherapy, and laxatives.   Artificial sweeteners and fructose.   Digestive disorders.  HOME CARE INSTRUCTIONS   Ensure adequate fluid intake (hydration): have 1 cup (8 oz) of fluid for each diarrhea episode. Avoid fluids that contain simple sugars or sports drinks, fruit juices, whole milk products, and sodas. Your urine should be clear or pale yellow if you are drinking enough fluids. Hydrate with an oral rehydration solution that you can purchase at pharmacies, retail stores, and online. You can prepare an oral rehydration solution at home by mixing the following ingredients together:    - tsp table salt.    tsp baking soda.    tsp salt substitute containing potassium chloride.   1  tablespoons sugar.   1 L (34 oz) of water.   Certain foods and beverages may increase the speed at which food moves through the gastrointestinal (GI) tract. These foods and beverages should be avoided and include:   Caffeinated and alcoholic beverages.   High-fiber foods, such as raw fruits and vegetables, nuts, seeds, and whole grain breads and cereals.   Foods and beverages sweetened with sugar alcohols, such as xylitol, sorbitol, and mannitol.   Some foods may be well  tolerated and may help thicken stool including:   Starchy foods, such as rice, toast, pasta, low-sugar cereal, oatmeal, grits, baked potatoes, crackers, and bagels.   Bananas.   Applesauce.   Add probiotic-rich foods to help increase healthy bacteria in the GI tract, such as yogurt and fermented milk products.   Wash your hands well after each diarrhea episode.   Only take over-the-counter or prescription medicines as directed by your caregiver.   Take a warm bath to relieve any burning or pain from frequent diarrhea episodes.  SEEK IMMEDIATE MEDICAL CARE IF:    You are unable to keep fluids down.   You have persistent vomiting.   You have blood in your stool, or your stools are black and tarry.   You do not urinate in 6-8 hours, or there is only a small amount of very dark urine.   You have abdominal pain that increases or localizes.   You have weakness, dizziness, confusion, or lightheadedness.   You have a severe headache.   Your diarrhea gets worse or does not get better.   You have a fever or persistent symptoms for more than 2-3 days.   You have a fever and your symptoms suddenly get worse.  MAKE SURE YOU:    Understand these instructions.   Will watch your condition.   Will get help right away if you are not doing well or get worse.  Document Released: 01/01/2002 Document Revised: 12/29/2011 Document Reviewed: 09/19/2011  ExitCare Patient Information 2015 [employer]. This information   is not intended to replace advice given to you by your health care provider. Make sure you discuss any questions you have with your health care provider.

## 2013-07-27 LAB — LIPASE: Lipase: 68 U/L (ref 0–75)

## 2013-07-27 LAB — AMYLASE: Amylase: 57 U/L (ref 0–105)

## 2013-07-27 LAB — CLOSTRIDIUM DIFFICILE BY PCR: Toxigenic C. Difficile by PCR: NOT DETECTED

## 2013-07-28 NOTE — Progress Notes (Signed)
Patient ID: Kristina Ayala, female   DOB: [birth year], 68 y.o.   MRN: [id number]   Kristina Ayala, is a 68 y.o. female  BDZ:[id number]  TMH:[id number]  DOB - [birth year]  Chief Complaint  Patient presents with  . Follow-up        Subjective:   Kristina Ayala is a 68 y.o. female here today for a follow up visit. Patient has history of hypertension and dyslipidemia all controlled recently seen in the hospital for left-sided abdominal pain. CT abdomen showed some sigmoid diverticulosis otherwise no acute findings. After leaving the hospital admission started having diarrhea which is ongoing in the abdominal pain has persisted. She has no fever, she has no nausea or vomiting. She rated her pain as about 8/10 radiating to her back, associated with diarrhea as stated above. Stool is nonbloody non-mucoid. She is not on antibiotics or any medications that will cause abdominal pain. No urinary symptoms, no history of trauma. Patient has No headache, No chest pain, No new weakness tingling or numbness, No Cough - SOB.  No problems updated.  ALLERGIES: Allergies  Allergen Reactions  . Doxycycline Diarrhea  . Minocycline Diarrhea  . Penicillins Rash    PAST MEDICAL HISTORY: Past Medical History  Diagnosis Date  . Hypertension   . Arthritis   . Bursitis     left elbow  . Infectious colitis   . Clostridium difficile infection   . HLD (hyperlipidemia)   . Frequent falls     MEDICATIONS AT HOME: Prior to Admission medications   Medication Sig Start Date End Date Taking? Authorizing Provider  amLODipine (NORVASC) 5 MG tablet Take 1 tablet (5 mg total) by mouth daily. 10/20/12 10/20/13 Yes Iskra Barbera Setters, MD  atorvastatin (LIPITOR) 40 MG tablet Take 1 tablet (40 mg total) by mouth daily. 01/12/13  Yes Reyne Dumas, MD  calcium citrate-vitamin D (CALCIUM + D) 315-200 MG-UNIT per tablet Take 1 tablet by mouth daily. 06/28/13  Yes Angelica Chessman, MD  hydrochlorothiazide (HYDRODIURIL)  25 MG tablet Take 1 tablet (25 mg total) by mouth daily. 03/26/13  Yes Angelica Chessman, MD  HYDROcodone-acetaminophen (NORCO/VICODIN) 5-325 MG per tablet Take 1-2 tablets by mouth every 4 (four) hours as needed for moderate pain or severe pain. 07/24/13  Yes Kaitlyn Szekalski, PA-C  lisinopril (PRINIVIL,ZESTRIL) 40 MG tablet Take 1 tablet (40 mg total) by mouth daily. 03/26/13  Yes Angelica Chessman, MD  Magnesium 500 MG CAPS Take 1 capsule (500 mg total) by mouth daily. 06/28/13  Yes Angelica Chessman, MD  ondansetron (ZOFRAN ODT) 4 MG disintegrating tablet Take 1 tablet (4 mg total) by mouth every 8 (eight) hours as needed for nausea or vomiting. 07/24/13  Yes Alvina Chou, PA-C  polyethylene glycol powder (GLYCOLAX/MIRALAX) powder Take 17 g by mouth daily. Until daily soft stools  OTC 07/24/13  Yes Kaitlyn Szekalski, PA-C  potassium chloride (KLOR-CON 10) 10 MEQ tablet Take 1 tablet (10 mEq total) by mouth daily. 07/26/13  Yes Angelica Chessman, MD  venlafaxine XR (EFFEXOR-XR) 75 MG 24 hr capsule Take 1 capsule (75 mg total) by mouth daily. 05/11/13  Yes Angelica Chessman, MD  vitamin B-12 (CYANOCOBALAMIN) 1000 MCG tablet Take 1 tablet (1,000 mcg total) by mouth daily. 06/28/13  Yes Angelica Chessman, MD  ciprofloxacin (CIPRO) 500 MG tablet Take 1 tablet (500 mg total) by mouth 2 (two) times daily. 07/26/13   Angelica Chessman, MD  metroNIDAZOLE (FLAGYL) 500 MG tablet Take 1 tablet (500 mg total) by mouth 3 (three) times daily. 07/26/13  Angelica Chessman, MD     Objective:   Filed Vitals:   07/26/13 1446  BP: 144/88  Pulse: 97  Temp: 98.6 F (37 C)  TempSrc: Oral  Resp: 16  Height: 5\' 5"  (1.651 m)  Weight: 167 lb (75.751 kg)  SpO2: 96%    Exam General appearance : Awake, alert, not in any distress. Speech Clear. Not toxic looking HEENT: Atraumatic and Normocephalic, pupils equally reactive to light and accomodation Neck: supple, no JVD. No cervical lymphadenopathy.  Chest:Good air entry  bilaterally, no added sounds  CVS: S1 S2 regular, no murmurs.  Abdomen: Bowel sounds present, Non tender and not distended with no gaurding, rigidity or rebound. Extremities: B/L Lower Ext shows no edema, both legs are warm to touch Neurology: Awake alert, and oriented X 3, CN II-XII intact, Non focal Skin:No Rash Wounds:N/A  Data Review Lab Results  Component Value Date   HGBA1C 5.7 07/26/2013   HGBA1C 6.0* 10/20/2012     Assessment & Plan   - POCT glycosylated hemoglobin (Hb A1C) is 5.7% today down from 6.0% in September of 2014  1. Essential hypertension, benign Continue hydrochlorothiazide 25 mg tablet by mouth daily Continue lisinopril 40 mg tablet by mouth daily - potassium chloride (KLOR-CON 10) 10 MEQ tablet; Take 1 tablet (10 mEq total) by mouth daily.  Dispense: 30 tablet; Refill: 3  2. Diverticulosis of colon without hemorrhage  - ciprofloxacin (CIPRO) 500 MG tablet; Take 1 tablet (500 mg total) by mouth 2 (two) times daily.  Dispense: 14 tablet; Refill: 0 - metroNIDAZOLE (FLAGYL) 500 MG tablet; Take 1 tablet (500 mg total) by mouth 3 (three) times daily.  Dispense: 21 tablet; Refill: 0   3. Diarrhea  - ciprofloxacin (CIPRO) 500 MG tablet; Take 1 tablet (500 mg total) by mouth 2 (two) times daily.  Dispense: 14 tablet; Refill: 0 - metroNIDAZOLE (FLAGYL) 500 MG tablet; Take 1 tablet (500 mg total) by mouth 3 (three) times daily.  Dispense: 21 tablet; Refill: 0  - Lipase - Amylase - Ova and parasite examination - Clostridium Difficile by PCR  Follow up in 3 months or as needed  The patient was given clear instructions to go to ER or return to medical center if symptoms don't improve, worsen or new problems develop. The patient verbalized understanding. The patient was told to call to get lab results if they haven't heard anything in the next week.   This note has been created with Surveyor, quantity. Any transcriptional  errors are unintentional.    Angelica Chessman, MD, [place], [place], [place] and [place], [place]   07/28/2013, 10:21 AM

## 2013-07-29 ENCOUNTER — Encounter (HOSPITAL_COMMUNITY): Payer: Self-pay | Admitting: Emergency Medicine

## 2013-07-29 ENCOUNTER — Emergency Department (HOSPITAL_COMMUNITY)
Admission: EM | Admit: 2013-07-29 | Discharge: 2013-07-29 | Disposition: A | Discharge status: Home / Self Care | Payer: Medicare Other | Attending: Emergency Medicine | Admitting: Emergency Medicine

## 2013-07-29 DIAGNOSIS — I10 Essential (primary) hypertension: Secondary | ICD-10-CM | POA: Insufficient documentation

## 2013-07-29 DIAGNOSIS — B029 Zoster without complications: Secondary | ICD-10-CM

## 2013-07-29 DIAGNOSIS — Z9181 History of falling: Secondary | ICD-10-CM | POA: Diagnosis not present

## 2013-07-29 DIAGNOSIS — Z880 Allergy status to penicillin: Secondary | ICD-10-CM | POA: Insufficient documentation

## 2013-07-29 DIAGNOSIS — Z79899 Other long term (current) drug therapy: Secondary | ICD-10-CM | POA: Diagnosis not present

## 2013-07-29 DIAGNOSIS — R109 Unspecified abdominal pain: Secondary | ICD-10-CM | POA: Diagnosis not present

## 2013-07-29 DIAGNOSIS — B019 Varicella without complication: Secondary | ICD-10-CM | POA: Diagnosis not present

## 2013-07-29 DIAGNOSIS — F17200 Nicotine dependence, unspecified, uncomplicated: Secondary | ICD-10-CM | POA: Diagnosis not present

## 2013-07-29 DIAGNOSIS — E785 Hyperlipidemia, unspecified: Secondary | ICD-10-CM | POA: Insufficient documentation

## 2013-07-29 DIAGNOSIS — R21 Rash and other nonspecific skin eruption: Secondary | ICD-10-CM | POA: Diagnosis present

## 2013-07-29 DIAGNOSIS — R1110 Vomiting, unspecified: Secondary | ICD-10-CM | POA: Insufficient documentation

## 2013-07-29 DIAGNOSIS — M129 Arthropathy, unspecified: Secondary | ICD-10-CM | POA: Insufficient documentation

## 2013-07-29 DIAGNOSIS — Z792 Long term (current) use of antibiotics: Secondary | ICD-10-CM | POA: Diagnosis not present

## 2013-07-29 LAB — CBC WITH DIFFERENTIAL/PLATELET
BASOS PCT: 1 % (ref 0–1)
Basophils Absolute: 0 10*3/uL (ref 0.0–0.1)
Eosinophils Absolute: 0.1 10*3/uL (ref 0.0–0.7)
Eosinophils Relative: 2 % (ref 0–5)
HCT: 40.1 % (ref 36.0–46.0)
Hemoglobin: 13.4 g/dL (ref 12.0–15.0)
LYMPHS ABS: 1.7 10*3/uL (ref 0.7–4.0)
Lymphocytes Relative: 36 % (ref 12–46)
MCH: 26.9 pg (ref 26.0–34.0)
MCHC: 33.4 g/dL (ref 30.0–36.0)
MCV: 80.4 fL (ref 78.0–100.0)
Monocytes Absolute: 0.7 10*3/uL (ref 0.1–1.0)
Monocytes Relative: 14 % — ABNORMAL HIGH (ref 3–12)
Neutro Abs: 2.2 10*3/uL (ref 1.7–7.7)
Neutrophils Relative %: 47 % (ref 43–77)
Platelets: 216 10*3/uL (ref 150–400)
RBC: 4.99 MIL/uL (ref 3.87–5.11)
RDW: 14.5 % (ref 11.5–15.5)
WBC: 4.7 10*3/uL (ref 4.0–10.5)

## 2013-07-29 LAB — URINALYSIS, ROUTINE W REFLEX MICROSCOPIC
Bilirubin Urine: NEGATIVE
Glucose, UA: NEGATIVE mg/dL
Hgb urine dipstick: NEGATIVE
Ketones, ur: NEGATIVE mg/dL
Nitrite: NEGATIVE
Protein, ur: NEGATIVE mg/dL
Specific Gravity, Urine: 1.021 (ref 1.005–1.030)
Urobilinogen, UA: 1 mg/dL (ref 0.0–1.0)
pH: 5.5 (ref 5.0–8.0)

## 2013-07-29 LAB — BASIC METABOLIC PANEL
ANION GAP: 12 (ref 5–15)
BUN: 11 mg/dL (ref 6–23)
CALCIUM: 9.6 mg/dL (ref 8.4–10.5)
CO2: 30 mEq/L (ref 19–32)
CREATININE: 0.82 mg/dL (ref 0.50–1.10)
Chloride: 99 mEq/L (ref 96–112)
GFR calc Af Amer: 83 mL/min — ABNORMAL LOW (ref >90)
GFR, EST NON AFRICAN AMERICAN: 71 mL/min — AB (ref >90)
Glucose, Bld: 105 mg/dL — ABNORMAL HIGH (ref 70–99)
Potassium: 3.8 mEq/L (ref 3.7–5.3)
SODIUM: 141 meq/L (ref 137–147)

## 2013-07-29 LAB — URINE MICROSCOPIC-ADD ON

## 2013-07-29 MED ORDER — ONDANSETRON 4 MG PO TBDP
4.0000 mg | ORAL_TABLET | Freq: Once | ORAL | Status: AC
  Administered 2013-07-29: 4 mg via ORAL
  Filled 2013-07-29: qty 1

## 2013-07-29 MED ORDER — VALACYCLOVIR HCL 500 MG PO TABS
1000.0000 mg | ORAL_TABLET | Freq: Once | ORAL | Status: AC
  Administered 2013-07-29: 1000 mg via ORAL
  Filled 2013-07-29: qty 2

## 2013-07-29 MED ORDER — VALACYCLOVIR HCL 1 G PO TABS: 1000.0000 mg | ORAL_TABLET | Freq: Three times a day (TID) | ORAL | Status: DC

## 2013-07-29 MED ORDER — ONDANSETRON 4 MG PO TBDP: 4.0000 mg | ORAL_TABLET | Freq: Three times a day (TID) | ORAL | Status: DC | PRN

## 2013-07-29 NOTE — ED Notes (Signed)
Dr. Wofford at bedside 

## 2013-07-29 NOTE — ED Notes (Signed)
Patient with diverticulitis, comes in today with nausea, vomiting, diarrhea, and another problem of red areas surrounding moles on her abdomen which she is also concerned about.  Patient is taking Flagyl and possibly Keflex patient is not sure of name of medication.  Patient not tolerating meds well.  Patient also feels weak.

## 2013-07-29 NOTE — Discharge Instructions (Signed)
Shingles °Shingles (herpes zoster) is an infection that is caused by the same virus that causes chickenpox (varicella). The infection causes a painful skin rash and fluid-filled blisters, which eventually break open, crust over, and heal. It may occur in any area of the body, but it usually affects only one side of the body or face. The pain of shingles usually lasts about 1 month. However, some people with shingles may develop long-term (chronic) pain in the affected area of the body. °Shingles often occurs many years after the person had chickenpox. It is more common: °· In people older than 50 years. °· In people with weakened immune systems, such as those with HIV, AIDS, or cancer. °· In people taking medicines that weaken the immune system, such as transplant medicines. °· In people under great stress. °CAUSES  °Shingles is caused by the varicella zoster virus (VZV), which also causes chickenpox. After a person is infected with the virus, it can remain in the person's body for years in an inactive state (dormant). To cause shingles, the virus reactivates and breaks out as an infection in a nerve root. °The virus can be spread from person to person (contagious) through contact with open blisters of the shingles rash. It will only spread to people who have not had chickenpox. When these people are exposed to the virus, they may develop chickenpox. They will not develop shingles. Once the blisters scab over, the person is no longer contagious and cannot spread the virus to others. °SYMPTOMS  °Shingles shows up in stages. The initial symptoms may be pain, itching, and tingling in an area of the skin. This pain is usually described as burning, stabbing, or throbbing. In a few days or weeks, a painful red rash will appear in the area where the pain, itching, and tingling were felt. The rash is usually on one side of the body in a band or belt-like pattern. Then, the rash usually turns into fluid-filled blisters. They  will scab over and dry up in approximately 2-3 weeks. °Flu-like symptoms may also occur with the initial symptoms, the rash, or the blisters. These may include: °· Fever. °· Chills. °· Headache. °· Upset stomach. °DIAGNOSIS  °Your caregiver will perform a skin exam to diagnose shingles. Skin scrapings or fluid samples may also be taken from the blisters. This sample will be examined under a microscope or sent to a lab for further testing. °TREATMENT  °There is no specific cure for shingles. Your caregiver will likely prescribe medicines to help you manage the pain, recover faster, and avoid long-term problems. This may include antiviral drugs, anti-inflammatory drugs, and pain medicines. °HOME CARE INSTRUCTIONS  °· Take a cool bath or apply cool compresses to the area of the rash or blisters as directed. This may help with the pain and itching.   °· Only take over-the-counter or prescription medicines as directed by your caregiver.   °· Rest as directed by your caregiver. °· Keep your rash and blisters clean with mild soap and cool water or as directed by your caregiver.  °· Do not pick your blisters or scratch your rash. Apply an anti-itch cream or numbing creams to the affected area as directed by your caregiver. °· Keep your shingles rash covered with a loose bandage (dressing). °· Avoid skin contact with: °¨ Babies.   °¨ Pregnant women.   °¨ Children with eczema.   °¨ Elderly people with transplants.   °¨ People with chronic illnesses, such as leukemia or AIDS.   °· Wear loose-fitting clothing to help ease the   pain of material rubbing against the rash. °· Keep all follow-up appointments with your caregiver. If the area involved is on your face, you may receive a referral for follow-up to a specialist, such as an eye doctor (ophthalmologist) or an ear, nose, and throat (ENT) doctor. Keeping all follow-up appointments will help you avoid eye complications, chronic pain, or disability.   °SEEK IMMEDIATE MEDICAL  CARE IF:  °· You have facial pain, pain around the eye area, or loss of feeling on one side of your face. °· You have ear pain or ringing in your ear. °· You have loss of taste. °· Your pain is not relieved with prescribed medicines.   °· Your redness or swelling spreads.   °· You have more pain and swelling.  °· Your condition is worsening or has changed.   °· You have a fever or persistent symptoms for more than 2-3 days. °· You have a fever and your symptoms suddenly get worse. °MAKE SURE YOU: °· Understand these instructions. °· Will watch your condition. °· Will get help right away if you are not doing well or get worse. °Document Released: 01/11/2005 Document Revised: 10/06/2011 Document Reviewed: 08/26/2011 °[employer]® Patient Information ©2015 [employer]. This information is not intended to replace advice given to you by your health care provider. Make sure you discuss any questions you have with your health care provider. ° °

## 2013-07-29 NOTE — ED Notes (Addendum)
Upon assessment pt has area to L flank that is red in area and blistered and pt reports pain. Pt took "oxycontin" PTA. Pt states that she has had this for 3 days since she started to take abx. Pt states that she has never had chicken pox

## 2013-07-29 NOTE — ED Notes (Signed)
Pt from home c/o diverticulitis flare. Pt was rx'd abx from her PCP that she has tried to take but unable to keep any meds down. Pt reports emesis x3 days. Pt is A&O and in NAD

## 2013-07-29 NOTE — ED Provider Notes (Signed)
CSN: [id number]     Arrival date & time 07/29/13  1138 History   First MD Initiated Contact with Patient 07/29/13 1208     Chief Complaint  Patient presents with  . Emesis     (Consider location/radiation/quality/duration/timing/severity/associated sxs/prior Treatment) Patient is a 68 y.o. female presenting with rash.  Rash Location:  Torso Torso rash location:  L flank Quality: blistering, burning and painful   Pain details:    Severity:  Moderate   Onset quality:  Gradual   Duration:  3 days   Timing:  Constant   Progression:  Worsening Severity:  Moderate Onset quality:  Gradual Duration:  3 days Timing:  Constant Progression:  Worsening Chronicity:  New Context comment:  Has been evaluated twice in the past week for flank pain.  Currently receiving treatment for presumed diverticulitis. Relieved by:  Nothing Ineffective treatments: topical oatmeal. Associated symptoms: abdominal pain and vomiting   Associated symptoms: no diarrhea, no fever and no shortness of breath     Past Medical History  Diagnosis Date  . Hypertension   . Arthritis   . Bursitis     left elbow  . Infectious colitis   . Clostridium difficile infection   . HLD (hyperlipidemia)   . Frequent falls    Past Surgical History  Procedure Laterality Date  . Tonsillectomy    . Orif proximal tibial plateau fracture Left 02/19/2012  . Polypectomy    . Colonoscopy     Family History  Problem Relation Age of Onset  . Diverticulosis Mother   . Ovarian cancer Mother   . Pancreatic cancer Father   . Lung cancer Father   . Colon polyps Sister   . Colon cancer Neg Hx    History  Substance Use Topics  . Smoking status: Current Some Day Smoker -- 0.25 packs/day for 50 years    Types: Cigarettes  . Smokeless tobacco: Never Used     Comment: 2 cigarettes daily  . Alcohol Use: No   OB History   Grav Para Term Preterm Abortions TAB SAB Ect Mult Living                 Review of Systems   Constitutional: Negative for fever.  Respiratory: Negative for shortness of breath.   Gastrointestinal: Positive for vomiting and abdominal pain. Negative for diarrhea.  Skin: Positive for rash.  All other systems reviewed and are negative.     Allergies  Doxycycline; Minocycline; and Penicillins  Home Medications   Prior to Admission medications   Medication Sig Start Date End Date Taking? Authorizing Provider  amLODipine (NORVASC) 5 MG tablet Take 5 mg by mouth daily.   Yes Historical Provider, MD  atorvastatin (LIPITOR) 40 MG tablet Take 40 mg by mouth daily.   Yes Historical Provider, MD  calcium-vitamin D (OSCAL WITH D) 500-200 MG-UNIT per tablet Take 1 tablet by mouth.   Yes Historical Provider, MD  hydrochlorothiazide (HYDRODIURIL) 25 MG tablet Take 25 mg by mouth daily.   Yes Historical Provider, MD  HYDROcodone-acetaminophen (NORCO/VICODIN) 5-325 MG per tablet Take 1-2 tablets by mouth every 4 (four) hours as needed for moderate pain.   Yes Historical Provider, MD  lisinopril (PRINIVIL,ZESTRIL) 40 MG tablet Take 40 mg by mouth daily.   Yes Historical Provider, MD  magnesium oxide (MAG-OX) 400 MG tablet Take 400 mg by mouth daily.   Yes Historical Provider, MD  ondansetron (ZOFRAN-ODT) 4 MG disintegrating tablet Take 4 mg by mouth every 8 (eight) hours  as needed for nausea or vomiting.   Yes Historical Provider, MD  polyethylene glycol (MIRALAX / GLYCOLAX) packet Take 17 g by mouth daily.   Yes Historical Provider, MD  potassium chloride (K-DUR) 10 MEQ tablet Take 10 mEq by mouth daily.   Yes Historical Provider, MD  venlafaxine XR (EFFEXOR-XR) 75 MG 24 hr capsule Take 75 mg by mouth daily with breakfast.   Yes Historical Provider, MD  vitamin B-12 (CYANOCOBALAMIN) 1000 MCG tablet Take 1,000 mcg by mouth daily.   Yes Historical Provider, MD  ciprofloxacin (CIPRO) 500 MG tablet Take 1 tablet (500 mg total) by mouth 2 (two) times daily. 07/26/13   Angelica Chessman, MD   metroNIDAZOLE (FLAGYL) 500 MG tablet Take 1 tablet (500 mg total) by mouth 3 (three) times daily. 07/26/13   Angelica Chessman, MD   BP 166/98  Pulse 100  Temp(Src) 98.7 F (37.1 C) (Oral)  Resp 22  SpO2 98% Physical Exam  Nursing note and vitals reviewed. Constitutional: She is oriented to person, place, and time. She appears well-developed and well-nourished. No distress.  HENT:  Head: Normocephalic and atraumatic.  Mouth/Throat: Oropharynx is clear and moist.  Eyes: Conjunctivae are normal. Pupils are equal, round, and reactive to light. No scleral icterus.  Neck: Neck supple.  Cardiovascular: Normal rate, regular rhythm, normal heart sounds and intact distal pulses.   No murmur heard. Pulmonary/Chest: Effort normal and breath sounds normal. No stridor. No respiratory distress. She has no rales.  Abdominal: Soft. Bowel sounds are normal. She exhibits no distension. There is no tenderness.  Musculoskeletal: Normal range of motion.  Neurological: She is alert and oriented to person, place, and time.  Skin: Skin is warm and dry. Rash noted.  Vesicular rash in dermatomal distribution on left flank  Psychiatric: She has a normal mood and affect. Her behavior is normal.    ED Course  Procedures (including critical care time) Labs Review Labs Reviewed  CBC WITH DIFFERENTIAL - Abnormal; Notable for the following:    Monocytes Relative 14 (*)    All other components within normal limits  URINALYSIS, ROUTINE W REFLEX MICROSCOPIC - Abnormal; Notable for the following:    Color, Urine AMBER (*)    APPearance CLOUDY (*)    Leukocytes, UA SMALL (*)    All other components within normal limits  BASIC METABOLIC PANEL - Abnormal; Notable for the following:    Glucose, Bld 105 (*)    GFR calc non Af Amer 71 (*)    GFR calc Af Amer 83 (*)    All other components within normal limits  URINE MICROSCOPIC-ADD ON    Imaging Review No results found.   EKG Interpretation None      MDM    Final diagnoses:  Varicella zoster    68 yo female with a weeks history of left flank pain now presenting with a rash and emesis.  She was seen in the ED last week for left flank pain and had a negative CT done at that time. Subsequently, her PCP put her on Cipro and Flagyl secondary to presumed diverticulitis. Shortly after that clinic visit, she developed the onset of a left flank rash. This was less than 72 hours ago. On exam, well-appearing without abdominal tenderness. She has a rash classic for shingles. I suspect that her flank pain was related to the shingles is a preceding symptom of the rash. I don't think she had a kidney stone or diverticulitis. Her abdominal exam is very reassuring today.  Her nausea resolved after by mouth Zofran. Given valacyclovir for treatment of her shingles.   Houston Siren III, MD 07/29/13 [id number]

## 2013-07-30 ENCOUNTER — Telehealth: Payer: Self-pay | Admitting: *Deleted

## 2013-07-30 LAB — OVA AND PARASITE EXAMINATION: OP: NONE SEEN

## 2013-07-30 NOTE — Telephone Encounter (Signed)
Patient left a voicemail wanting to speak to Dr. Doreene Burke. Returned patient's call. No answer left a voicemail for patient to call back.

## 2013-08-03 ENCOUNTER — Telehealth: Payer: Self-pay | Admitting: *Deleted

## 2013-08-03 ENCOUNTER — Telehealth: Payer: Self-pay | Admitting: Internal Medicine

## 2013-08-03 NOTE — Telephone Encounter (Signed)
Left message that her labs were normal.

## 2013-08-03 NOTE — Telephone Encounter (Signed)
Message copied by Joan Mayans on Fri Aug 03, 2013  9:14 AM ------      Message from: Tresa Garter      Created: Tue Jul 31, 2013  6:11 PM       Please inform patient that her laboratory tests results are within normal limit and her stool test for infection are all negative ------

## 2013-08-03 NOTE — Telephone Encounter (Signed)
Pt returning call, please f/u with pt.  °

## 2013-08-09 ENCOUNTER — Encounter: Payer: Self-pay | Admitting: *Deleted

## 2013-08-09 ENCOUNTER — Ambulatory Visit: Payer: Medicare Other

## 2013-08-09 DIAGNOSIS — M792 Neuralgia and neuritis, unspecified: Secondary | ICD-10-CM

## 2013-08-09 MED ORDER — GABAPENTIN 300 MG PO CAPS: 300.0000 mg | ORAL_CAPSULE | Freq: Three times a day (TID) | ORAL | Status: DC

## 2013-08-09 NOTE — Patient Instructions (Signed)
Shingles °Shingles (herpes zoster) is an infection that is caused by the same virus that causes chickenpox (varicella). The infection causes a painful skin rash and fluid-filled blisters, which eventually break open, crust over, and heal. It may occur in any area of the body, but it usually affects only one side of the body or face. The pain of shingles usually lasts about 1 month. However, some people with shingles may develop long-term (chronic) pain in the affected area of the body. °Shingles often occurs many years after the person had chickenpox. It is more common: °· In people older than 50 years. °· In people with weakened immune systems, such as those with HIV, AIDS, or cancer. °· In people taking medicines that weaken the immune system, such as transplant medicines. °· In people under great stress. °CAUSES  °Shingles is caused by the varicella zoster virus (VZV), which also causes chickenpox. After a person is infected with the virus, it can remain in the person's body for years in an inactive state (dormant). To cause shingles, the virus reactivates and breaks out as an infection in a nerve root. °The virus can be spread from person to person (contagious) through contact with open blisters of the shingles rash. It will only spread to people who have not had chickenpox. When these people are exposed to the virus, they may develop chickenpox. They will not develop shingles. Once the blisters scab over, the person is no longer contagious and cannot spread the virus to others. °SYMPTOMS  °Shingles shows up in stages. The initial symptoms may be pain, itching, and tingling in an area of the skin. This pain is usually described as burning, stabbing, or throbbing. In a few days or weeks, a painful red rash will appear in the area where the pain, itching, and tingling were felt. The rash is usually on one side of the body in a band or belt-like pattern. Then, the rash usually turns into fluid-filled blisters. They  will scab over and dry up in approximately 2-3 weeks. °Flu-like symptoms may also occur with the initial symptoms, the rash, or the blisters. These may include: °· Fever. °· Chills. °· Headache. °· Upset stomach. °DIAGNOSIS  °Your caregiver will perform a skin exam to diagnose shingles. Skin scrapings or fluid samples may also be taken from the blisters. This sample will be examined under a microscope or sent to a lab for further testing. °TREATMENT  °There is no specific cure for shingles. Your caregiver will likely prescribe medicines to help you manage the pain, recover faster, and avoid long-term problems. This may include antiviral drugs, anti-inflammatory drugs, and pain medicines. °HOME CARE INSTRUCTIONS  °· Take a cool bath or apply cool compresses to the area of the rash or blisters as directed. This may help with the pain and itching.   °· Only take over-the-counter or prescription medicines as directed by your caregiver.   °· Rest as directed by your caregiver. °· Keep your rash and blisters clean with mild soap and cool water or as directed by your caregiver.  °· Do not pick your blisters or scratch your rash. Apply an anti-itch cream or numbing creams to the affected area as directed by your caregiver. °· Keep your shingles rash covered with a loose bandage (dressing). °· Avoid skin contact with: °¨ Babies.   °¨ Pregnant women.   °¨ Children with eczema.   °¨ Elderly people with transplants.   °¨ People with chronic illnesses, such as leukemia or AIDS.   °· Wear loose-fitting clothing to help ease the   pain of material rubbing against the rash. °· Keep all follow-up appointments with your caregiver. If the area involved is on your face, you may receive a referral for follow-up to a specialist, such as an eye doctor (ophthalmologist) or an ear, nose, and throat (ENT) doctor. Keeping all follow-up appointments will help you avoid eye complications, chronic pain, or disability.   °SEEK IMMEDIATE MEDICAL  CARE IF:  °· You have facial pain, pain around the eye area, or loss of feeling on one side of your face. °· You have ear pain or ringing in your ear. °· You have loss of taste. °· Your pain is not relieved with prescribed medicines.   °· Your redness or swelling spreads.   °· You have more pain and swelling.  °· Your condition is worsening or has changed.   °· You have a fever or persistent symptoms for more than 2-3 days. °· You have a fever and your symptoms suddenly get worse. °MAKE SURE YOU: °· Understand these instructions. °· Will watch your condition. °· Will get help right away if you are not doing well or get worse. °Document Released: 01/11/2005 Document Revised: 10/06/2011 Document Reviewed: 08/26/2011 °[employer]® Patient Information ©2015 [employer]. This information is not intended to replace advice given to you by your health care provider. Make sure you discuss any questions you have with your health care provider. ° °

## 2013-08-09 NOTE — Progress Notes (Unsigned)
Patient present to walk in clinic with c/o pain form shingles on her left lower side and lower back. Patient was seen in ED for shingles on 07/29/2013. Patient states pain is 7/10. Patient is taking Valtrex as prescribed with one more dose left. Consulted with Dr. Annitta Needs. Verbal order given for Gabapentin 300 mg three times a day. Patient verbalized understanding.

## 2013-08-09 NOTE — Progress Notes (Unsigned)
Patient presents to

## 2013-09-05 NOTE — Progress Notes (Signed)
Patient ID: [name], female   DOB: [birth year], 68 y.o.   MRN: [id number]   Kristina Ayala, is a 68 y.o. female  [id number]  [id number]  DOB - [birth year]  Chief Complaint  Patient presents with  . Follow-up  . Hypertension  . COPD  . Hyperlipidemia        Subjective:   Kristina Ayala is a 68 y.o. female here today for a follow up visit. Patient is known to have hypertension, dyslipidemia here today for her routine followup. She has no significant complaint. She does not need refill on her medications. Blood pressure and cholesterol are controlled. Patient is compliant with medication without significant side effects. Patient has No headache, No chest pain, No abdominal pain - No Nausea, No new weakness tingling or numbness, No Cough - SOB.  No problems updated.  ALLERGIES: Allergies  Allergen Reactions  . Doxycycline Diarrhea  . Minocycline Diarrhea  . Penicillins Rash    PAST MEDICAL HISTORY: Past Medical History  Diagnosis Date  . Hypertension   . Arthritis   . Bursitis     left elbow  . Infectious colitis   . Clostridium difficile infection   . HLD (hyperlipidemia)   . Frequent falls     MEDICATIONS AT HOME: Prior to Admission medications   Medication Sig Start Date End Date Taking? Authorizing Provider  amLODipine (NORVASC) 5 MG tablet Take 5 mg by mouth every morning.     Historical Provider, MD  atorvastatin (LIPITOR) 40 MG tablet Take 40 mg by mouth every morning.     Historical Provider, MD  calcium-vitamin D (OSCAL WITH D) 500-200 MG-UNIT per tablet Take 1 tablet by mouth daily with breakfast.     Historical Provider, MD  gabapentin (NEURONTIN) 300 MG capsule Take 1 capsule (300 mg total) by mouth 3 (three) times daily. 08/09/13   Lorayne Marek, MD  hydrochlorothiazide (HYDRODIURIL) 25 MG tablet Take 25 mg by mouth every morning.     Historical Provider, MD  HYDROcodone-acetaminophen (NORCO/VICODIN) 5-325 MG per tablet Take 1-2  tablets by mouth every 4 (four) hours as needed for moderate pain.    Historical Provider, MD  lisinopril (PRINIVIL,ZESTRIL) 40 MG tablet Take 40 mg by mouth every morning.     Historical Provider, MD  magnesium oxide (MAG-OX) 400 MG tablet Take 400 mg by mouth every morning.     Historical Provider, MD  ondansetron (ZOFRAN ODT) 4 MG disintegrating tablet Take 1 tablet (4 mg total) by mouth every 8 (eight) hours as needed for nausea or vomiting. 07/29/13   Houston Siren III, MD  ondansetron (ZOFRAN-ODT) 4 MG disintegrating tablet Take 4 mg by mouth every 8 (eight) hours as needed for nausea or vomiting.    Historical Provider, MD  potassium chloride (K-DUR) 10 MEQ tablet Take 10 mEq by mouth every morning.     Historical Provider, MD  valACYclovir (VALTREX) 1000 MG tablet Take 1 tablet (1,000 mg total) by mouth 3 (three) times daily. 07/29/13   Houston Siren III, MD  venlafaxine XR (EFFEXOR-XR) 75 MG 24 hr capsule Take 75 mg by mouth daily with breakfast.    Historical Provider, MD  vitamin B-12 (CYANOCOBALAMIN) 1000 MCG tablet Take 1,000 mcg by mouth every morning.     Historical Provider, MD     Objective:   Filed Vitals:   06/28/13 1603  BP: 145/89  Pulse: 91  Temp: 99 F (37.2 C)  TempSrc: Oral  Resp: 16  Height: 5\' 5"  (1.651  m)  Weight: 166 lb (75.297 kg)  SpO2: 96%    Exam General appearance : Awake, alert, not in any distress. Speech Clear. Not toxic looking HEENT: Atraumatic and Normocephalic, pupils equally reactive to light and accomodation Neck: supple, no JVD. No cervical lymphadenopathy.  Chest:Good air entry bilaterally, no added sounds  CVS: S1 S2 regular, no murmurs.  Abdomen: Bowel sounds present, Non tender and not distended with no gaurding, rigidity or rebound. Extremities: B/L Lower Ext shows no edema, both legs are warm to touch Neurology: Awake alert, and oriented X 3, CN II-XII intact, Non focal Skin:No Rash Wounds:N/A  Data Review Lab Results    Component Value Date   HGBA1C 5.7 07/26/2013   HGBA1C 6.0* 10/20/2012     Assessment & Plan   1. Essential hypertension, benign  - Magnesium  2. Dyslipidemia  - Lipid panel  Patient was counseled extensively about nutrition and exercise Continue current medication Please call the clinic for refill of her medication when necessary  Return in about 3 months (around 09/28/2013), or if symptoms worsen or fail to improve, for Follow up HTN.  The patient was given clear instructions to go to ER or return to medical center if symptoms don't improve, worsen or new problems develop. The patient verbalized understanding. The patient was told to call to get lab results if they haven't heard anything in the next week.   This note has been created with Surveyor, quantity. Any transcriptional errors are unintentional.    Angelica Chessman, MD, [place], [place], [place] and [facility] [place], [place]

## 2013-09-21 ENCOUNTER — Telehealth: Payer: Self-pay | Admitting: *Deleted

## 2013-09-21 NOTE — Telephone Encounter (Signed)
Patient calling wanting to know if we can call her something in for sinusitis. Informed patient we can't just call something in. Informed patient she can come to walk-in clinic on Monday if she is not better. Patient verbalized understanding. Vivia Birmingham, RN

## 2013-10-02 ENCOUNTER — Ambulatory Visit: Payer: Medicare Other | Attending: Internal Medicine | Admitting: Internal Medicine

## 2013-10-02 ENCOUNTER — Encounter: Payer: Self-pay | Admitting: Internal Medicine

## 2013-10-02 VITALS — BP 140/90 | HR 97 | Temp 98.5°F | Resp 18 | Ht 65.0 in | Wt 165.4 lb

## 2013-10-02 DIAGNOSIS — F17200 Nicotine dependence, unspecified, uncomplicated: Secondary | ICD-10-CM | POA: Diagnosis not present

## 2013-10-02 DIAGNOSIS — E785 Hyperlipidemia, unspecified: Secondary | ICD-10-CM | POA: Diagnosis not present

## 2013-10-02 DIAGNOSIS — J4489 Other specified chronic obstructive pulmonary disease: Secondary | ICD-10-CM | POA: Insufficient documentation

## 2013-10-02 DIAGNOSIS — J320 Chronic maxillary sinusitis: Secondary | ICD-10-CM | POA: Diagnosis not present

## 2013-10-02 DIAGNOSIS — J449 Chronic obstructive pulmonary disease, unspecified: Secondary | ICD-10-CM | POA: Diagnosis not present

## 2013-10-02 DIAGNOSIS — I10 Essential (primary) hypertension: Secondary | ICD-10-CM | POA: Diagnosis not present

## 2013-10-02 LAB — LIPID PANEL
Cholesterol: 139 mg/dL (ref 0–200)
HDL: 44 mg/dL (ref >39)
LDL CALC: 58 mg/dL (ref 0–99)
TRIGLYCERIDES: 187 mg/dL — AB (ref <150)
Total CHOL/HDL Ratio: 3.2 Ratio
VLDL: 37 mg/dL (ref 0–40)

## 2013-10-02 MED ORDER — LORATADINE 10 MG PO TABS: 10.0000 mg | ORAL_TABLET | Freq: Every day | ORAL | Status: DC

## 2013-10-02 MED ORDER — FLUTICASONE PROPIONATE 50 MCG/ACT NA SUSP: 2.0000 | Freq: Every day | NASAL | Status: DC

## 2013-10-02 NOTE — Progress Notes (Signed)
Pt here for 3 month f/u HTN, Cholesterol and sinus infection Pt is taking blood pressure meds daily Denies pain today  C/o post nasal drip with minimal cough unrelieved by OTC cough medications

## 2013-10-02 NOTE — Progress Notes (Signed)
Patient ID: Kristina Ayala, female   DOB: [birth year], 68 y.o.   MRN: [id number]   Kristina Ayala, is a 68 y.o. female  [id number]  [id number]  DOB - January 31, 1945  Chief Complaint  Patient presents with  . Follow-up  . Hypertension  . Sinusitis        Subjective:   Kristina Ayala is a 68 y.o. female here today for a follow up visit. Patient with history of hypertension, dyslipidemia, mild COPD and chronic tobacco abuse here today for routine follow-up. She is complaining of sinus infection, she complains of postnasal drip with minimal cough. If by OTC cough medications. Patient is compliant with blood pressure medication, no side effects reported. Patient has No headache, No chest pain, No abdominal pain - No Nausea, No new weakness tingling or numbness.  Problem  Chronic Maxillary Sinusitis    ALLERGIES: Allergies  Allergen Reactions  . Doxycycline Diarrhea  . Minocycline Diarrhea  . Penicillins Rash    PAST MEDICAL HISTORY: Past Medical History  Diagnosis Date  . Hypertension   . Arthritis   . Bursitis     left elbow  . Infectious colitis   . Clostridium difficile infection   . HLD (hyperlipidemia)   . Frequent falls     MEDICATIONS AT HOME: Prior to Admission medications   Medication Sig Start Date End Date Taking? Authorizing Provider  atorvastatin (LIPITOR) 40 MG tablet Take 40 mg by mouth every morning.    Yes Historical Provider, MD  hydrochlorothiazide (HYDRODIURIL) 25 MG tablet Take 25 mg by mouth every morning.    Yes Historical Provider, MD  lisinopril (PRINIVIL,ZESTRIL) 40 MG tablet Take 40 mg by mouth every morning.    Yes Historical Provider, MD  amLODipine (NORVASC) 5 MG tablet Take 5 mg by mouth every morning.     Historical Provider, MD  calcium-vitamin D (OSCAL WITH D) 500-200 MG-UNIT per tablet Take 1 tablet by mouth daily with breakfast.     Historical Provider, MD  fluticasone (FLONASE) 50 MCG/ACT nasal spray Place 2 sprays into  both nostrils daily. 10/02/13   Tresa Garter, MD  gabapentin (NEURONTIN) 300 MG capsule Take 1 capsule (300 mg total) by mouth 3 (three) times daily. 08/09/13   Lorayne Marek, MD  HYDROcodone-acetaminophen (NORCO/VICODIN) 5-325 MG per tablet Take 1-2 tablets by mouth every 4 (four) hours as needed for moderate pain.    Historical Provider, MD  loratadine (CLARITIN) 10 MG tablet Take 1 tablet (10 mg total) by mouth daily. 10/02/13   Tresa Garter, MD  magnesium oxide (MAG-OX) 400 MG tablet Take 400 mg by mouth every morning.     Historical Provider, MD  ondansetron (ZOFRAN ODT) 4 MG disintegrating tablet Take 1 tablet (4 mg total) by mouth every 8 (eight) hours as needed for nausea or vomiting. 07/29/13   Houston Siren III, MD  ondansetron (ZOFRAN-ODT) 4 MG disintegrating tablet Take 4 mg by mouth every 8 (eight) hours as needed for nausea or vomiting.    Historical Provider, MD  potassium chloride (K-DUR) 10 MEQ tablet Take 10 mEq by mouth every morning.     Historical Provider, MD  valACYclovir (VALTREX) 1000 MG tablet Take 1 tablet (1,000 mg total) by mouth 3 (three) times daily. 07/29/13   Houston Siren III, MD  venlafaxine XR (EFFEXOR-XR) 75 MG 24 hr capsule Take 75 mg by mouth daily with breakfast.    Historical Provider, MD  vitamin B-12 (CYANOCOBALAMIN) 1000 MCG tablet Take 1,000 mcg by  mouth every morning.     Historical Provider, MD     Objective:   Filed Vitals:   10/02/13 0929  BP: 140/90  Pulse: 97  Temp: 98.5 F (36.9 C)  TempSrc: Oral  Resp: 18  Height: 5\' 5"  (1.651 m)  Weight: 165 lb 6.4 oz (75.025 kg)  SpO2: 100%    Exam General appearance : Awake, alert, not in any distress. Speech Clear. Not toxic looking HEENT: Atraumatic and Normocephalic, pupils equally reactive to light and accomodation Neck: supple, no JVD. No cervical lymphadenopathy.  Chest:Good air entry bilaterally, no added sounds  CVS: S1 S2 regular, no murmurs.  Abdomen: Bowel sounds  present, Non tender and not distended with no gaurding, rigidity or rebound. Extremities: B/L Lower Ext shows no edema, both legs are warm to touch Neurology: Awake alert, and oriented X 3, CN II-XII intact, Non focal  Data Review Lab Results  Component Value Date   HGBA1C 5.7 07/26/2013   HGBA1C 6.0* 10/20/2012     Assessment & Plan   1. Essential hypertension, benign  - DASH Diet  2. Dyslipidemia  Continue atorvastatin 40 mg tablet by mouth daily - Lipid panel  3. Chronic maxillary sinusitis  - loratadine (CLARITIN) 10 MG tablet; Take 1 tablet (10 mg total) by mouth daily.  Dispense: 30 tablet; Refill: 11 - fluticasone (FLONASE) 50 MCG/ACT nasal spray; Place 2 sprays into both nostrils daily.  Dispense: 16 g; Refill: 6  Patient was counseled extensively about nutrition and exercise.  Return in about 3 months (around 01/01/2014), or if symptoms worsen or fail to improve, for Follow up HTN, Annual Physical.  The patient was given clear instructions to go to ER or return to medical center if symptoms don't improve, worsen or new problems develop. The patient verbalized understanding. The patient was told to call to get lab results if they haven't heard anything in the next week.   This note has been created with Surveyor, quantity. Any transcriptional errors are unintentional.    Angelica Chessman, MD, [place], [place], [place] and [place], [place]   10/02/2013, 10:15 AM

## 2013-10-02 NOTE — Patient Instructions (Signed)

## 2013-10-14 ENCOUNTER — Encounter (HOSPITAL_COMMUNITY): Payer: Self-pay | Admitting: Emergency Medicine

## 2013-10-14 ENCOUNTER — Emergency Department (HOSPITAL_COMMUNITY): Payer: Medicare Other

## 2013-10-14 ENCOUNTER — Emergency Department (HOSPITAL_COMMUNITY)
Admission: EM | Admit: 2013-10-14 | Discharge: 2013-10-14 | Disposition: A | Discharge status: Home / Self Care | Payer: Medicare Other | Attending: Emergency Medicine | Admitting: Emergency Medicine

## 2013-10-14 DIAGNOSIS — I159 Secondary hypertension, unspecified: Secondary | ICD-10-CM

## 2013-10-14 DIAGNOSIS — Z8619 Personal history of other infectious and parasitic diseases: Secondary | ICD-10-CM | POA: Insufficient documentation

## 2013-10-14 DIAGNOSIS — Z79899 Other long term (current) drug therapy: Secondary | ICD-10-CM | POA: Insufficient documentation

## 2013-10-14 DIAGNOSIS — I158 Other secondary hypertension: Secondary | ICD-10-CM | POA: Insufficient documentation

## 2013-10-14 DIAGNOSIS — Z8739 Personal history of other diseases of the musculoskeletal system and connective tissue: Secondary | ICD-10-CM | POA: Diagnosis not present

## 2013-10-14 DIAGNOSIS — Z9181 History of falling: Secondary | ICD-10-CM | POA: Diagnosis not present

## 2013-10-14 DIAGNOSIS — Z880 Allergy status to penicillin: Secondary | ICD-10-CM | POA: Insufficient documentation

## 2013-10-14 DIAGNOSIS — R51 Headache: Secondary | ICD-10-CM | POA: Diagnosis not present

## 2013-10-14 DIAGNOSIS — R519 Headache, unspecified: Secondary | ICD-10-CM

## 2013-10-14 DIAGNOSIS — E785 Hyperlipidemia, unspecified: Secondary | ICD-10-CM | POA: Diagnosis not present

## 2013-10-14 DIAGNOSIS — F17200 Nicotine dependence, unspecified, uncomplicated: Secondary | ICD-10-CM | POA: Insufficient documentation

## 2013-10-14 LAB — I-STAT CHEM 8, ED
BUN: 9 mg/dL (ref 6–23)
CALCIUM ION: 1.13 mmol/L (ref 1.13–1.30)
CHLORIDE: 102 meq/L (ref 96–112)
Creatinine, Ser: 0.8 mg/dL (ref 0.50–1.10)
Glucose, Bld: 91 mg/dL (ref 70–99)
HCT: 40 % (ref 36.0–46.0)
Hemoglobin: 13.6 g/dL (ref 12.0–15.0)
Potassium: 3.4 mEq/L — ABNORMAL LOW (ref 3.7–5.3)
Sodium: 140 mEq/L (ref 137–147)
TCO2: 30 mmol/L (ref 0–100)

## 2013-10-14 MED ORDER — SODIUM CHLORIDE 0.9 % IV BOLUS (SEPSIS)
1000.0000 mL | Freq: Once | INTRAVENOUS | Status: AC
  Administered 2013-10-14: 1000 mL via INTRAVENOUS

## 2013-10-14 MED ORDER — MORPHINE SULFATE 4 MG/ML IJ SOLN
4.0000 mg | Freq: Once | INTRAMUSCULAR | Status: AC
  Administered 2013-10-14: 4 mg via INTRAVENOUS
  Filled 2013-10-14: qty 1

## 2013-10-14 MED ORDER — HYDRALAZINE HCL 20 MG/ML IJ SOLN
5.0000 mg | Freq: Once | INTRAMUSCULAR | Status: AC
  Administered 2013-10-14: 5 mg via INTRAVENOUS
  Filled 2013-10-14: qty 1

## 2013-10-14 NOTE — Discharge Instructions (Signed)
Hypertension °Hypertension, commonly called high blood pressure, is when the force of blood pumping through your arteries is too strong. Your arteries are the blood vessels that carry blood from your heart throughout your body. A blood pressure reading consists of a higher number over a lower number, such as 110/72. The higher number (systolic) is the pressure inside your arteries when your heart pumps. The lower number (diastolic) is the pressure inside your arteries when your heart relaxes. Ideally you want your blood pressure below 120/80. °Hypertension forces your heart to work harder to pump blood. Your arteries may become narrow or stiff. Having hypertension puts you at risk for heart disease, stroke, and other problems.  °RISK FACTORS °Some risk factors for high blood pressure are controllable. Others are not.  °Risk factors you cannot control include:  °· Race. You may be at higher risk if you are African American. °· Age. Risk increases with age. °· Gender. Men are at higher risk than women before age 45 years. After age 65, women are at higher risk than men. °Risk factors you can control include: °· Not getting enough exercise or physical activity. °· Being overweight. °· Getting too much fat, sugar, calories, or salt in your diet. °· Drinking too much alcohol. °SIGNS AND SYMPTOMS °Hypertension does not usually cause signs or symptoms. Extremely high blood pressure (hypertensive crisis) may cause headache, anxiety, shortness of breath, and nosebleed. °DIAGNOSIS  °To check if you have hypertension, your health care provider will measure your blood pressure while you are seated, with your arm held at the level of your heart. It should be measured at least twice using the same arm. Certain conditions can cause a difference in blood pressure between your right and left arms. A blood pressure reading that is higher than normal on one occasion does not mean that you need treatment. If one blood pressure reading  is high, ask your health care provider about having it checked again. °TREATMENT  °Treating high blood pressure includes making lifestyle changes and possibly taking medicine. Living a healthy lifestyle can help lower high blood pressure. You may need to change some of your habits. °Lifestyle changes may include: °· Following the DASH diet. This diet is high in fruits, vegetables, and whole grains. It is low in salt, red meat, and added sugars. °· Getting at least 2½ hours of brisk physical activity every week. °· Losing weight if necessary. °· Not smoking. °· Limiting alcoholic beverages. °· Learning ways to reduce stress. ° If lifestyle changes are not enough to get your blood pressure under control, your health care provider may prescribe medicine. You may need to take more than one. Work closely with your health care provider to understand the risks and benefits. °HOME CARE INSTRUCTIONS °· Have your blood pressure rechecked as directed by your health care provider.   °· Take medicines only as directed by your health care provider. Follow the directions carefully. Blood pressure medicines must be taken as prescribed. The medicine does not work as well when you skip doses. Skipping doses also puts you at risk for problems.   °· Do not smoke.   °· Monitor your blood pressure at home as directed by your health care provider.  °SEEK MEDICAL CARE IF:  °· You think you are having a reaction to medicines taken. °· You have recurrent headaches or feel dizzy. °· You have swelling in your ankles. °· You have trouble with your vision. °SEEK IMMEDIATE MEDICAL CARE IF: °· You develop a severe headache or confusion. °·   You have unusual weakness, numbness, or feel faint. °· You have severe chest or abdominal pain. °· You vomit repeatedly. °· You have trouble breathing. °MAKE SURE YOU:  °· Understand these instructions. °· Will watch your condition. °· Will get help right away if you are not doing well or get worse. °Document  Released: 01/11/2005 Document Revised: 05/28/2013 Document Reviewed: 11/03/2012 °ExitCare® Patient Information ©2015 [employer]. This information is not intended to replace advice given to you by your health care provider. Make sure you discuss any questions you have with your health care provider. ° °Headaches, Frequently Asked Questions °MIGRAINE HEADACHES °Q: What is migraine? What causes it? How can I treat it? °A: Generally, migraine headaches begin as a dull ache. Then they develop into a constant, throbbing, and pulsating pain. You may experience pain at the temples. You may experience pain at the front or back of one or both sides of the head. The pain is usually accompanied by a combination of: °· Nausea. °· Vomiting. °· Sensitivity to light and noise. °Some people (about 15%) experience an aura (see below) before an attack. The cause of migraine is believed to be chemical reactions in the brain. Treatment for migraine may include over-the-counter or prescription medications. It may also include self-help techniques. These include relaxation training and biofeedback.  °Q: What is an aura? °A: About 15% of people with migraine get an "aura". This is a sign of neurological symptoms that occur before a migraine headache. You may see wavy or jagged lines, dots, or flashing lights. You might experience tunnel vision or blind spots in one or both eyes. The aura can include visual or auditory hallucinations (something imagined). It may include disruptions in smell (such as strange odors), taste or touch. Other symptoms include: °· Numbness. °· A "pins and needles" sensation. °· Difficulty in recalling or speaking the correct word. °These neurological events may last as long as 60 minutes. These symptoms will fade as the headache begins. °Q: What is a trigger? °A: Certain physical or environmental factors can lead to or "trigger" a migraine. These include: °· Foods. °· Hormonal  changes. °· Weather. °· Stress. °It is important to remember that triggers are different for everyone. To help prevent migraine attacks, you need to figure out which triggers affect you. Keep a headache diary. This is a good way to track triggers. The diary will help you talk to your healthcare professional about your condition. °Q: Does weather affect migraines? °A: Bright sunshine, hot, humid conditions, and drastic changes in barometric pressure may lead to, or "trigger," a migraine attack in some people. But studies have shown that weather does not act as a trigger for everyone with migraines. °Q: What is the link between migraine and hormones? °A: Hormones start and regulate many of your body's functions. Hormones keep your body in balance within a constantly changing environment. The levels of hormones in your body are unbalanced at times. Examples are during menstruation, pregnancy, or menopause. That can lead to a migraine attack. In fact, about three quarters of all women with migraine report that their attacks are related to the menstrual cycle.  °Q: Is there an increased risk of stroke for migraine sufferers? °A: The likelihood of a migraine attack causing a stroke is very remote. That is not to say that migraine sufferers cannot have a stroke associated with their migraines. In persons under age 40, the most common associated factor for stroke is migraine headache. But over the course of a   person's normal life span, the occurrence of migraine headache may actually be associated with a reduced risk of dying from cerebrovascular disease due to stroke.  °Q: What are acute medications for migraine? °A: Acute medications are used to treat the pain of the headache after it has started. Examples over-the-counter medications, NSAIDs, ergots, and triptans.  °Q: What are the triptans? °A: Triptans are the newest class of abortive medications. They are specifically targeted to treat migraine. Triptans are  vasoconstrictors. They moderate some chemical reactions in the brain. The triptans work on receptors in your brain. Triptans help to restore the balance of a neurotransmitter called serotonin. Fluctuations in levels of serotonin are thought to be a main cause of migraine.  °Q: Are over-the-counter medications for migraine effective? °A: Over-the-counter, or "OTC," medications may be effective in relieving mild to moderate pain and associated symptoms of migraine. But you should see your caregiver before beginning any treatment regimen for migraine.  °Q: What are preventive medications for migraine? °A: Preventive medications for migraine are sometimes referred to as "prophylactic" treatments. They are used to reduce the frequency, severity, and length of migraine attacks. Examples of preventive medications include antiepileptic medications, antidepressants, beta-blockers, calcium channel blockers, and NSAIDs (nonsteroidal anti-inflammatory drugs). °Q: Why are anticonvulsants used to treat migraine? °A: During the past few years, there has been an increased interest in antiepileptic drugs for the prevention of migraine. They are sometimes referred to as "anticonvulsants". Both epilepsy and migraine may be caused by similar reactions in the brain.  °Q: Why are antidepressants used to treat migraine? °A: Antidepressants are typically used to treat people with depression. They may reduce migraine frequency by regulating chemical levels, such as serotonin, in the brain.  °Q: What alternative therapies are used to treat migraine? °A: The term "alternative therapies" is often used to describe treatments considered outside the scope of conventional Western medicine. Examples of alternative therapy include acupuncture, acupressure, and yoga. Another common alternative treatment is herbal therapy. Some herbs are believed to relieve headache pain. Always discuss alternative therapies with your caregiver before proceeding. Some  herbal products contain arsenic and other toxins. °TENSION HEADACHES °Q: What is a tension-type headache? What causes it? How can I treat it? °A: Tension-type headaches occur randomly. They are often the result of temporary stress, anxiety, fatigue, or anger. Symptoms include soreness in your temples, a tightening band-like sensation around your head (a "vice-like" ache). Symptoms can also include a pulling feeling, pressure sensations, and contracting head and neck muscles. The headache begins in your forehead, temples, or the back of your head and neck. Treatment for tension-type headache may include over-the-counter or prescription medications. Treatment may also include self-help techniques such as relaxation training and biofeedback. °CLUSTER HEADACHES °Q: What is a cluster headache? What causes it? How can I treat it? °A: Cluster headache gets its name because the attacks come in groups. The pain arrives with little, if any, warning. It is usually on one side of the head. A tearing or bloodshot eye and a runny nose on the same side of the headache may also accompany the pain. Cluster headaches are believed to be caused by chemical reactions in the brain. They have been described as the most severe and intense of any headache type. Treatment for cluster headache includes prescription medication and oxygen. °SINUS HEADACHES °Q: What is a sinus headache? What causes it? How can I treat it? °A: When a cavity in the bones of the face and skull (a sinus) becomes   inflamed, the inflammation will cause localized pain. This condition is usually the result of an allergic reaction, a tumor, or an infection. If your headache is caused by a sinus blockage, such as an infection, you will probably have a fever. An x-ray will confirm a sinus blockage. Your caregiver's treatment might include antibiotics for the infection, as well as antihistamines or decongestants.  °REBOUND HEADACHES °Q: What is a rebound headache? What  causes it? How can I treat it? °A: A pattern of taking acute headache medications too often can lead to a condition known as "rebound headache." A pattern of taking too much headache medication includes taking it more than 2 days per week or in excessive amounts. That means more than the label or a caregiver advises. With rebound headaches, your medications not only stop relieving pain, they actually begin to cause headaches. Doctors treat rebound headache by tapering the medication that is being overused. Sometimes your caregiver will gradually substitute a different type of treatment or medication. Stopping may be a challenge. Regularly overusing a medication increases the potential for serious side effects. Consult a caregiver if you regularly use headache medications more than 2 days per week or more than the label advises. °ADDITIONAL QUESTIONS AND ANSWERS °Q: What is biofeedback? °A: Biofeedback is a self-help treatment. Biofeedback uses special equipment to monitor your body's involuntary physical responses. Biofeedback monitors: °· Breathing. °· Pulse. °· Heart rate. °· Temperature. °· Muscle tension. °· Brain activity. °Biofeedback helps you refine and perfect your relaxation exercises. You learn to control the physical responses that are related to stress. Once the technique has been mastered, you do not need the equipment any more. °Q: Are headaches hereditary? °A: Four out of five (80%) of people that suffer report a family history of migraine. Scientists are not sure if this is genetic or a family predisposition. Despite the uncertainty, a child has a 50% chance of having migraine if one parent suffers. The child has a 75% chance if both parents suffer.  °Q: Can children get headaches? °A: By the time they reach high school, most young people have experienced some type of headache. Many safe and effective approaches or medications can prevent a headache from occurring or stop it after it has begun.  °Q:  What type of doctor should I see to diagnose and treat my headache? °A: Start with your primary caregiver. Discuss his or her experience and approach to headaches. Discuss methods of classification, diagnosis, and treatment. Your caregiver may decide to recommend you to a headache specialist, depending upon your symptoms or other physical conditions. Having diabetes, allergies, etc., may require a more comprehensive and inclusive approach to your headache. The [employer] will provide, upon request, a list of NHF physician members in your state. °Document Released: 04/03/2003 Document Revised: 04/05/2011 Document Reviewed: 09/11/2007 °ExitCare® Patient Information ©2015 [employer]. This information is not intended to replace advice given to you by your health care provider. Make sure you discuss any questions you have with your health care provider. ° °

## 2013-10-14 NOTE — ED Notes (Signed)
Pt states that she has been having a headache since Friday.  Pt takes meds for htn as she should and went to her friend's house who checked her BP.  Throbbing headache.  States her BP was 270 systolic.  Neuro intact.

## 2013-10-14 NOTE — ED Notes (Signed)
D/t pt receiving IV narcotics she does have a safe ride home and will not be driving.

## 2013-10-14 NOTE — ED Notes (Signed)
Pt c/o headache rating 5/10 x 2 days. Pt states having Hx of headaches in the past associated with her hypertension that she treats at home. Pt says "I can hear it in my ears, like swoosh swoosh swoosh". Pt is A+Ox4, PWD, resting and in NAD.

## 2013-10-14 NOTE — ED Provider Notes (Signed)
CSN: [id number]     Arrival date & time 10/14/13  1757 History   First MD Initiated Contact with Patient 10/14/13 1810     Chief Complaint  Patient presents with  . Hypertension  . Headache     (Consider location/radiation/quality/duration/timing/severity/associated sxs/prior Treatment) Patient is a 68 y.o. female presenting with headaches. The history is provided by the patient.  Headache Pain location:  Generalized Quality: throbbing. Severity currently:  9/10 Severity at highest:  5/10 Onset quality:  Gradual Duration:  3 days Timing:  Constant Progression:  Worsening Chronicity:  New Similar to prior headaches: yes (when BP has been up in the past)   Relieved by:  Nothing Worsened by:  Nothing tried Ineffective treatments:  NSAIDs Associated symptoms: no abdominal pain, no back pain, no blurred vision, no congestion, no cough, no diarrhea, no fatigue, no fever, no focal weakness, no loss of balance, no nausea, no neck pain, no neck stiffness, no numbness, no paresthesias, no photophobia, no sore throat, no syncope, no tingling, no visual change, no vomiting and no weakness   Risk factors: no anger, no family hx of SAH, does not have insomnia and lifestyle not sedentary     Past Medical History  Diagnosis Date  . Hypertension   . Arthritis   . Bursitis     left elbow  . Infectious colitis   . Clostridium difficile infection   . HLD (hyperlipidemia)   . Frequent falls    Past Surgical History  Procedure Laterality Date  . Tonsillectomy    . Orif proximal tibial plateau fracture Left 02/19/2012  . Polypectomy    . Colonoscopy     Family History  Problem Relation Age of Onset  . Diverticulosis Mother   . Ovarian cancer Mother   . Pancreatic cancer Father   . Lung cancer Father   . Colon polyps Sister   . Colon cancer Neg Hx    History  Substance Use Topics  . Smoking status: Current Some Day Smoker -- 0.25 packs/day for 50 years    Types: Cigarettes  .  Smokeless tobacco: Never Used     Comment: 1 pack every 3 days  . Alcohol Use: No   OB History   Grav Para Term Preterm Abortions TAB SAB Ect Mult Living                 Review of Systems  Constitutional: Negative for fever, chills, diaphoresis, activity change, appetite change and fatigue.  HENT: Negative for congestion, facial swelling, rhinorrhea and sore throat.   Eyes: Negative for blurred vision, photophobia and discharge.  Respiratory: Negative for cough, chest tightness and shortness of breath.   Cardiovascular: Negative for chest pain, palpitations, leg swelling and syncope.  Gastrointestinal: Negative for nausea, vomiting, abdominal pain and diarrhea.  Endocrine: Negative for polydipsia and polyuria.  Genitourinary: Negative for dysuria, frequency, difficulty urinating and pelvic pain.  Musculoskeletal: Negative for arthralgias, back pain, neck pain and neck stiffness.  Skin: Negative for color change and wound.  Allergic/Immunologic: Negative for immunocompromised state.  Neurological: Positive for headaches. Negative for focal weakness, facial asymmetry, weakness, numbness, paresthesias and loss of balance.  Hematological: Does not bruise/bleed easily.  Psychiatric/Behavioral: Negative for confusion and agitation.      Allergies  Doxycycline; Minocycline; and Penicillins  Home Medications   Prior to Admission medications   Medication Sig Start Date End Date Taking? Authorizing Provider  atorvastatin (LIPITOR) 40 MG tablet Take 40 mg by mouth every morning.  Yes Historical Provider, MD  calcium-vitamin D (OSCAL WITH D) 500-200 MG-UNIT per tablet Take 1 tablet by mouth daily with breakfast.    Yes Historical Provider, MD  Cyanocobalamin (VITAMIN B-12) 2500 MCG SUBL Place 1 tablet under the tongue daily.   Yes Historical Provider, MD  hydrochlorothiazide (HYDRODIURIL) 25 MG tablet Take 25 mg by mouth every morning.    Yes Historical Provider, MD  lisinopril  (PRINIVIL,ZESTRIL) 40 MG tablet Take 40 mg by mouth every morning.    Yes Historical Provider, MD  potassium chloride (K-DUR) 10 MEQ tablet Take 10 mEq by mouth every morning.    Yes Historical Provider, MD  venlafaxine XR (EFFEXOR-XR) 75 MG 24 hr capsule Take 75 mg by mouth daily with breakfast.   Yes Historical Provider, MD   BP 176/91  Pulse 83  Temp(Src) 98.1 F (36.7 C) (Oral)  Resp 20  SpO2 100% Physical Exam  Constitutional: She is oriented to person, place, and time. She appears well-developed and well-nourished. No distress.  HENT:  Head: Normocephalic and atraumatic.  Mouth/Throat: No oropharyngeal exudate.  Eyes: Pupils are equal, round, and reactive to light.  Neck: Normal range of motion. Neck supple.  Cardiovascular: Normal rate, regular rhythm and normal heart sounds.  Exam reveals no gallop and no friction rub.   No murmur heard. Pulmonary/Chest: Effort normal and breath sounds normal. No respiratory distress. She has no wheezes. She has no rales.  Abdominal: Soft. Bowel sounds are normal. She exhibits no distension and no mass. There is no tenderness. There is no rebound and no guarding.  Musculoskeletal: Normal range of motion. She exhibits no edema and no tenderness.  Neurological: She is alert and oriented to person, place, and time. She has normal strength. She displays no atrophy and no tremor. No cranial nerve deficit or sensory deficit. She exhibits normal muscle tone. She displays a negative Romberg sign. Coordination and gait normal. GCS eye subscore is 4. GCS verbal subscore is 5. GCS motor subscore is 6.  Skin: Skin is warm and dry.  Psychiatric: She has a normal mood and affect.    ED Course  Procedures (including critical care time) Labs Review Labs Reviewed  I-STAT CHEM 8, ED - Abnormal; Notable for the following:    Potassium 3.4 (*)    All other components within normal limits    Imaging Review Ct Head Wo Contrast  10/14/2013   CLINICAL DATA:   Headache for 2 days, swoosh sound in ears  EXAM: CT HEAD WITHOUT CONTRAST  TECHNIQUE: Contiguous axial images were obtained from the base of the skull through the vertex without intravenous contrast.  COMPARISON:  01/14/2007  FINDINGS: Moderate diffuse atrophy. Mild low attenuation in the deep white matter. No evidence of vascular territory infarct or mass. No hemorrhage or extra-axial fluid. Calvarium intact. No significant inflammatory change in the visualized portions of the sinuses.  IMPRESSION: No change from prior study. Age-related involutional change with no acute findings.   Electronically Signed   By: Skipper Cliche M.D.   On: 10/14/2013 20:27     EKG Interpretation None      MDM   Final diagnoses:  Nonintractable headache, unspecified chronicity pattern, unspecified headache type  Secondary hypertension, unspecified    Pt is a 68 y.o. female with Pmhx as above who presents with 3 days of gradual onset generalized worsening headache. She states that the headache is throbbing. She's says that she has had headaches in the past like this when her blood pressure  is benign. Her friend took her blood pressure today and it was 128 systolic. She has not missed any of her antihypertensives. She denies numbness, weakness, nausea vomiting, fever, neck stiffness. On physical exam vital signs are stable other than hypertension and she is in no acute distress. Focal neuro findings. EKG has no acute ischemic changes.. Creatinine is normal. CT head nml. Dose of 5 mg IV hydralazine and 4 mg IV morphine given with resolution of h/a and improvement of BP. Will d/c home w/ plan for close outpatient PCP followup. Return precautions given for new or worsening symptoms including worsening pain, numbness, weakness, chest pain, shortness of breath. I've also asked her to start a blood pressure log at home        Ernestina Patches, MD 10/14/13 2121

## 2013-11-12 ENCOUNTER — Ambulatory Visit (INDEPENDENT_AMBULATORY_CARE_PROVIDER_SITE_OTHER): Payer: Medicare Other | Admitting: Internal Medicine

## 2013-11-12 ENCOUNTER — Encounter: Payer: Self-pay | Admitting: Internal Medicine

## 2013-11-12 VITALS — BP 130/80 | HR 88 | Temp 98.3°F | Ht 65.0 in | Wt 168.0 lb

## 2013-11-12 DIAGNOSIS — Z23 Encounter for immunization: Secondary | ICD-10-CM

## 2013-11-12 DIAGNOSIS — E785 Hyperlipidemia, unspecified: Secondary | ICD-10-CM

## 2013-11-12 DIAGNOSIS — I10 Essential (primary) hypertension: Secondary | ICD-10-CM

## 2013-11-12 DIAGNOSIS — J438 Other emphysema: Secondary | ICD-10-CM

## 2013-11-12 MED ORDER — HYDROCHLOROTHIAZIDE 25 MG PO TABS: 25.0000 mg | ORAL_TABLET | Freq: Every morning | ORAL | Status: DC

## 2013-11-12 MED ORDER — ATORVASTATIN CALCIUM 40 MG PO TABS: 40.0000 mg | ORAL_TABLET | Freq: Every morning | ORAL | Status: DC

## 2013-11-12 MED ORDER — LISINOPRIL 40 MG PO TABS: 40.0000 mg | ORAL_TABLET | Freq: Every morning | ORAL | Status: DC

## 2013-11-12 NOTE — Progress Notes (Signed)
Pre visit review using our clinic review tool, if applicable. No additional management support is needed unless otherwise documented below in the visit note. 

## 2013-11-12 NOTE — Assessment & Plan Note (Signed)
Well controlled. BP: 130/80 mmHg  Lab Results  Component Value Date   NA 140 10/14/2013   K 3.4* 10/14/2013   CL 102 10/14/2013   CO2 30 07/29/2013   Lab Results  Component Value Date   CREATININE 0.80 10/14/2013

## 2013-11-12 NOTE — Progress Notes (Signed)
Subjective:    Patient ID: Kristina Ayala, female    DOB: [birth year], 68 y.o.   MRN: [id number]  HPI  68 year old African American female with history of hypertension, dyslipidemia, mild COPD and chronic tobacco use to reestablish. Patient denies any significant interval medical history. Patient has been tolerating atorvastatin 40 mg once daily. Her previous cholesterol levels were significantly elevated. Her most recent followup lipid panel was performed on 10/02/2013. There was dramatic decrease in her LDL to 58. She denies any adverse effects.  Hypertension-stable  Chronic tobacco use-patient able to decrease to using 2-3 cigarettes per day. Patient unable to quit. She is interested in tobacco cessation.  Review of Systems Negative for chest pain. Negative for shortness of breath    Past Medical History  Diagnosis Date  . Hypertension   . Arthritis   . Bursitis     left elbow  . Infectious colitis   . Clostridium difficile infection   . HLD (hyperlipidemia)   . Frequent falls     History   Social History  . Marital Status: Divorced    Spouse Name: N/A    Number of Children: 2  . Years of Education: N/A   Occupational History  . RETIRED    Social History Main Topics  . Smoking status: Current Some Day Smoker -- 0.25 packs/day for 50 years    Types: Cigarettes  . Smokeless tobacco: Never Used     Comment: 1 pack every 3 days  . Alcohol Use: No  . Drug Use: No  . Sexual Activity: Not on file   Other Topics Concern  . Not on file   Social History Narrative   Daughter lives with patient.     Son lives in [place]   She is originally from [place], [place]          Past Surgical History  Procedure Laterality Date  . Tonsillectomy    . Orif proximal tibial plateau fracture Left 02/19/2012  . Polypectomy    . Colonoscopy      Family History  Problem Relation Age of Onset  . Diverticulosis Mother   . Ovarian cancer Mother   . Pancreatic cancer Father     . Lung cancer Father   . Colon polyps Sister   . Colon cancer Neg Hx     Allergies  Allergen Reactions  . Doxycycline Diarrhea  . Minocycline Diarrhea  . Penicillins Rash    Current Outpatient Prescriptions on File Prior to Visit  Medication Sig Dispense Refill  . calcium-vitamin D (OSCAL WITH D) 500-200 MG-UNIT per tablet Take 1 tablet by mouth daily with breakfast.       . Cyanocobalamin (VITAMIN B-12) 2500 MCG SUBL Place 1 tablet under the tongue daily.      . potassium chloride (K-DUR) 10 MEQ tablet Take 10 mEq by mouth every morning.       . venlafaxine XR (EFFEXOR-XR) 75 MG 24 hr capsule Take 75 mg by mouth daily with breakfast.      . [DISCONTINUED] Calcium Carbonate-Vitamin D (CALCIUM + D PO) Take 1 tablet by mouth daily.       No current facility-administered medications on file prior to visit.    BP 130/80  Pulse 88  Temp(Src) 98.3 F (36.8 C) (Oral)  Ht 5\' 5"  (1.651 m)  Wt 168 lb (76.204 kg)  BMI 27.96 kg/m2    Objective:   Physical Exam  Constitutional: She is oriented to person, place, and time. She appears  well-developed and well-nourished. No distress.  HENT:  Head: Normocephalic and atraumatic.  Eyes: EOM are normal. Pupils are equal, round, and reactive to light.  Neck: Neck supple.  No carotid bruit  Cardiovascular: Normal rate, regular rhythm and normal heart sounds.   No murmur heard. Pulmonary/Chest: Effort normal and breath sounds normal. She has no wheezes.  Neurological: She is alert and oriented to person, place, and time. No cranial nerve deficit.  Psychiatric: She has a normal mood and affect. Her behavior is normal.          Assessment & Plan:

## 2013-11-12 NOTE — Assessment & Plan Note (Addendum)
Patient tolerating atorvastatin.   Lab Results  Component Value Date   CHOL 139 10/02/2013   HDL 44 10/02/2013   LDLCALC 58 10/02/2013   LDLDIRECT 213.1 05/22/2012   TRIG 187* 10/02/2013   CHOLHDL 3.2 10/02/2013   Lab Results  Component Value Date   ALT 32 07/24/2013   AST 25 07/24/2013   ALKPHOS 76 07/24/2013   BILITOT 0.3 07/24/2013

## 2013-11-12 NOTE — Patient Instructions (Signed)
Use over the counter nicotine gum or lozenges as directed Please complete the following lab tests before your next follow up appointment: BMET - 401.9

## 2013-11-12 NOTE — Assessment & Plan Note (Addendum)
Complete tobacco cessation strongly encouraged.  Patient to use OTC nicotine gum or lozenges.  Patient updated with flu vaccine.  She declines pneumovax.

## 2013-11-13 ENCOUNTER — Other Ambulatory Visit: Payer: Self-pay | Admitting: Internal Medicine

## 2013-11-13 ENCOUNTER — Telehealth: Payer: Self-pay | Admitting: Internal Medicine

## 2013-11-13 NOTE — Telephone Encounter (Signed)
emmi mailed  °

## 2013-11-22 ENCOUNTER — Telehealth: Payer: Self-pay

## 2013-11-22 NOTE — Telephone Encounter (Signed)
LVM for pt to call back.    RE: scheduling AWV for 2015 with NP or PA if pt allows.  

## 2013-12-11 ENCOUNTER — Encounter: Payer: Self-pay | Admitting: Family Medicine

## 2013-12-11 ENCOUNTER — Ambulatory Visit (INDEPENDENT_AMBULATORY_CARE_PROVIDER_SITE_OTHER): Payer: Medicare Other | Admitting: Family Medicine

## 2013-12-11 VITALS — BP 120/80 | HR 97 | Temp 98.4°F | Ht 65.0 in | Wt 167.0 lb

## 2013-12-11 DIAGNOSIS — J069 Acute upper respiratory infection, unspecified: Secondary | ICD-10-CM

## 2013-12-11 MED ORDER — HYDROCODONE-HOMATROPINE 5-1.5 MG/5ML PO SYRP: 5.0000 mL | ORAL_SOLUTION | Freq: Two times a day (BID) | ORAL | Status: DC | PRN

## 2013-12-11 NOTE — Progress Notes (Signed)
Pre visit review using our clinic review tool, if applicable. No additional management support is needed unless otherwise documented below in the visit note. 

## 2013-12-11 NOTE — Patient Instructions (Signed)
INSTRUCTIONS FOR UPPER RESPIRATORY INFECTION:  -plenty of rest and fluids  -nasal saline wash 2-3 times daily (use prepackaged nasal saline or bottled/distilled water if making your own)   -can use afrin nasal spray for drainage and nasal congestion - but do NOT use longer then 3-4 days  -can use tylenol or ibuprofen as directed for aches and sorethroat  -in the winter time, using a humidifier at night is helpful (please follow cleaning instructions)  -if you are taking a cough medication - use only as directed, may also try a teaspoon of honey to coat the throat and throat lozenges  -for sore throat, salt water gargles can help  -follow up if you have fevers, facial pain, tooth pain, difficulty breathing or are worsening or not getting better in 5-7 days  

## 2013-12-11 NOTE — Progress Notes (Signed)
HPI:  URI: -started: 5 days ago -symptoms:nasal congestion, sore throat, cough, HA, PND -denies:fever, SOB, NVD, upper tooth pain -has tried: claritin -sick contacts/travel/risks: denies flu exposure, tick exposure or or Ebola risks  ROS: See pertinent positives and negatives per HPI.  Past Medical History  Diagnosis Date  . Hypertension   . Arthritis   . Bursitis     left elbow  . Infectious colitis   . Clostridium difficile infection   . HLD (hyperlipidemia)   . Frequent falls     Past Surgical History  Procedure Laterality Date  . Tonsillectomy    . Orif proximal tibial plateau fracture Left 02/19/2012  . Polypectomy    . Colonoscopy      Family History  Problem Relation Age of Onset  . Diverticulosis Mother   . Ovarian cancer Mother   . Pancreatic cancer Father   . Lung cancer Father   . Colon polyps Sister   . Colon cancer Neg Hx     History   Social History  . Marital Status: Divorced    Spouse Name: N/A    Number of Children: 2  . Years of Education: N/A   Occupational History  . RETIRED    Social History Main Topics  . Smoking status: Current Some Day Smoker -- 0.25 packs/day for 50 years    Types: Cigarettes  . Smokeless tobacco: Never Used     Comment: 1 pack every 3 days  . Alcohol Use: No  . Drug Use: No  . Sexual Activity: None   Other Topics Concern  . None   Social History Narrative   Grandson (75 y/o) lives with patient.     Son lives in [place]   Daughter lives in [place]   She is originally from [place], [place]   Divorced             Current outpatient prescriptions: amLODipine (NORVASC) 5 MG tablet, TAKE 1 TABLET BY MOUTH ONCE DAILY, Disp: 30 tablet, Rfl: 0;  atorvastatin (LIPITOR) 40 MG tablet, Take 1 tablet (40 mg total) by mouth every morning., Disp: 90 tablet, Rfl: 1;  calcium-vitamin D (OSCAL WITH D) 500-200 MG-UNIT per tablet, Take 1 tablet by mouth daily with breakfast. , Disp: , Rfl:  Cyanocobalamin (VITAMIN  B-12) 2500 MCG SUBL, Place 1 tablet under the tongue daily., Disp: , Rfl: ;  hydrochlorothiazide (HYDRODIURIL) 25 MG tablet, Take 1 tablet (25 mg total) by mouth every morning., Disp: 90 tablet, Rfl: 1;  lisinopril (PRINIVIL,ZESTRIL) 40 MG tablet, Take 1 tablet (40 mg total) by mouth every morning., Disp: 90 tablet, Rfl: 1;  potassium chloride (K-DUR) 10 MEQ tablet, Take 10 mEq by mouth every morning. , Disp: , Rfl:  venlafaxine XR (EFFEXOR-XR) 75 MG 24 hr capsule, Take 75 mg by mouth daily with breakfast., Disp: , Rfl: ;  HYDROcodone-homatropine (HYCODAN) 5-1.5 MG/5ML syrup, Take 5 mLs by mouth every 12 (twelve) hours as needed for cough., Disp: 120 mL, Rfl: 0;  [DISCONTINUED] Calcium Carbonate-Vitamin D (CALCIUM + D PO), Take 1 tablet by mouth daily., Disp: , Rfl:   EXAM:  Filed Vitals:   12/11/13 1414  BP: 120/80  Pulse: 97  Temp: 98.4 F (36.9 C)    Body mass index is 27.79 kg/(m^2).  GENERAL: vitals reviewed and listed above, alert, oriented, appears well hydrated and in no acute distress  HEENT: atraumatic, conjunttiva clear, no obvious abnormalities on inspection of external nose and ears, normal appearance of ear canals and TMs, clear  nasal congestion, mild post oropharyngeal erythema with PND, no tonsillar edema or exudate, no sinus TTP  NECK: no obvious masses on inspection  LUNGS: clear to auscultation bilaterally, no wheezes, rales or rhonchi, good air movement  CV: HRRR, no peripheral edema  MS: moves all extremities without noticeable abnormality  PSYCH: pleasant and cooperative, no obvious depression or anxiety  ASSESSMENT AND PLAN:  Discussed the following assessment and plan:  Acute upper respiratory infection - Plan: HYDROcodone-homatropine (HYCODAN) 5-1.5 MG/5ML syrup  -given HPI and exam findings today, a serious infection or illness is unlikely. We discussed potential etiologies, with VURI being most likely, and advised supportive care and monitoring. We  discussed treatment side effects, likely course, antibiotic misuse, transmission, and signs of developing a serious illness. -of course, we advised to return or notify a doctor immediately if symptoms worsen or persist or new concerns arise.    Patient Instructions  INSTRUCTIONS FOR UPPER RESPIRATORY INFECTION:  -plenty of rest and fluids  -nasal saline wash 2-3 times daily (use prepackaged nasal saline or bottled/distilled water if making your own)   -can use afrin nasal spray for drainage and nasal congestion - but do NOT use longer then 3-4 days  -can use tylenol or ibuprofen as directed for aches and sorethroat  -in the winter time, using a humidifier at night is helpful (please follow cleaning instructions)  -if you are taking a cough medication - use only as directed, may also try a teaspoon of honey to coat the throat and throat lozenges  -for sore throat, salt water gargles can help  -follow up if you have fevers, facial pain, tooth pain, difficulty breathing or are worsening or not getting better in 5-7 days      KIM, HANNAH R.

## 2013-12-17 ENCOUNTER — Telehealth: Payer: Self-pay | Admitting: Internal Medicine

## 2013-12-17 NOTE — Telephone Encounter (Signed)
Pt is asking Dr Shawna Orleans can call her in something for her sinusitis . She saw Dr Maudie Mercury last week for congestion.    Pharmacy; [facility]  formerly [address]

## 2013-12-18 NOTE — Telephone Encounter (Signed)
Patient called back about the below request, please advise.

## 2013-12-19 NOTE — Telephone Encounter (Signed)
Please clarify what symptoms pt experiencing.  Pt may need to be seen at [facility] clinic on Friday.

## 2013-12-19 NOTE — Telephone Encounter (Signed)
Left message on pts home phone to go to Urgent Care

## 2013-12-21 ENCOUNTER — Other Ambulatory Visit: Payer: Self-pay | Admitting: Internal Medicine

## 2013-12-26 ENCOUNTER — Other Ambulatory Visit: Payer: Self-pay | Admitting: Internal Medicine

## 2014-01-04 ENCOUNTER — Encounter: Payer: Self-pay | Admitting: Internal Medicine

## 2014-01-04 ENCOUNTER — Ambulatory Visit (INDEPENDENT_AMBULATORY_CARE_PROVIDER_SITE_OTHER): Payer: Medicare Other | Admitting: Internal Medicine

## 2014-01-04 VITALS — BP 136/80 | HR 97 | Temp 98.8°F | Ht 65.5 in | Wt 166.0 lb

## 2014-01-04 DIAGNOSIS — G8929 Other chronic pain: Secondary | ICD-10-CM

## 2014-01-04 DIAGNOSIS — M25562 Pain in left knee: Secondary | ICD-10-CM

## 2014-01-04 DIAGNOSIS — Z23 Encounter for immunization: Secondary | ICD-10-CM

## 2014-01-04 DIAGNOSIS — M545 Low back pain, unspecified: Secondary | ICD-10-CM

## 2014-01-04 DIAGNOSIS — M25561 Pain in right knee: Secondary | ICD-10-CM

## 2014-01-04 DIAGNOSIS — M549 Dorsalgia, unspecified: Secondary | ICD-10-CM | POA: Insufficient documentation

## 2014-01-04 DIAGNOSIS — Z0000 Encounter for general adult medical examination without abnormal findings: Secondary | ICD-10-CM

## 2014-01-04 NOTE — Progress Notes (Signed)
Pre visit review using our clinic review tool, if applicable. No additional management support is needed unless otherwise documented below in the visit note. 

## 2014-01-04 NOTE — Assessment & Plan Note (Signed)
Patient complains of bilateral knee pain. She's had previous left knee surgery. I suspect her symptoms secondary to osteoarthritis. Refer to Dr. Tamala Julian to see if she is a candidate for cortisone and/or Synvisc injections.

## 2014-01-04 NOTE — Assessment & Plan Note (Addendum)
Reviewed adult health maintenance protocols.  Medicare wellness questionnaire reviewed in detail. Screening for falls, depression, hearing loss and memory problems negative. Patient continues to smoke. Patient counseled on smoking cessation. Patient plans to use over-the-counter nicotine replacement products.  Patient updated with Pneumovax. Arrange screening DEXA scan.  See attached medicare wellness questionnaire.

## 2014-01-04 NOTE — Progress Notes (Signed)
Subjective:    Patient ID: Kristina Ayala, female    DOB: Oct 12, 1945, 68 y.o.   MRN: [id number]  HPI  68 year old African American female with history of hypertension, dyslipidemia and chronic tobacco use presents for Medicare wellness exam. Medicare wellness questionnaire reviewed in detail. Patient reports episode of shingles treated at [facility] in July 2015. Patient is overdue for her annual eye exam.  See attached questionnaire for answers to additional questions.  Her last mammogram was completed in April 2015.  Her last colonoscopy recently performed in November 2015.  She is overdue for her bone density scan.  She complains of intermittent low back pain for last 1-2 months.  She denies any specific injury or trauma. She denies any radiation of her symptoms. She denies any lower extremity weakness.    Patient also complains of bilateral knee pain.  She has hx of previous left knee surgery.  She denies joint swelling or redness.  She has joint stiffness.  Review of Systems  Constitutional: Negative for activity change, appetite change and unexpected weight change.  Eyes: Negative for visual disturbance.  Respiratory: Negative for cough, chest tightness and shortness of breath.  Cardiovascular: Negative for chest pain.  Genitourinary: Negative for difficulty urinating.  Neurological: Negative for headaches.  Gastrointestinal: Negative for abdominal pain, heartburn melena or hematochezia Psych: Negative for depression or anxiety Endo:  No polyuria or polydypsia        Past Medical History  Diagnosis Date  . Hypertension   . Arthritis   . Bursitis     left elbow  . Infectious colitis   . Clostridium difficile infection   . HLD (hyperlipidemia)   . Frequent falls     History   Social History  . Marital Status: Divorced    Spouse Name: N/A    Number of Children: 2  . Years of Education: N/A   Occupational History  . RETIRED    Social History Main  Topics  . Smoking status: Current Some Day Smoker -- 0.25 packs/day for 50 years    Types: Cigarettes  . Smokeless tobacco: Never Used     Comment: 1 pack every 3 days  . Alcohol Use: No  . Drug Use: No  . Sexual Activity: Not on file   Other Topics Concern  . Not on file   Social History Narrative   Grandson (86 y/o) lives with patient.     Son lives in [place]   Daughter lives in [place]   She is originally from [place], [place]   Divorced             Past Surgical History  Procedure Laterality Date  . Tonsillectomy    . Orif proximal tibial plateau fracture Left 02/19/2012  . Polypectomy    . Colonoscopy      Family History  Problem Relation Age of Onset  . Diverticulosis Mother   . Ovarian cancer Mother   . Pancreatic cancer Father   . Lung cancer Father   . Colon polyps Sister   . Colon cancer Neg Hx     Allergies  Allergen Reactions  . Doxycycline Diarrhea  . Minocycline Diarrhea  . Penicillins Rash    Current Outpatient Prescriptions on File Prior to Visit  Medication Sig Dispense Refill  . amLODipine (NORVASC) 5 MG tablet TAKE 1 TABLET BY MOUTH DAILY 30 tablet 0  . atorvastatin (LIPITOR) 40 MG tablet Take 1 tablet (40 mg total) by mouth every morning. 90 tablet 1  .  calcium-vitamin D (OSCAL WITH D) 500-200 MG-UNIT per tablet Take 1 tablet by mouth daily with breakfast.     . Cyanocobalamin (VITAMIN B-12) 2500 MCG SUBL Place 1 tablet under the tongue daily.    . hydrochlorothiazide (HYDRODIURIL) 25 MG tablet Take 1 tablet (25 mg total) by mouth every morning. 90 tablet 1  . lisinopril (PRINIVIL,ZESTRIL) 40 MG tablet Take 1 tablet (40 mg total) by mouth every morning. 90 tablet 1  . potassium chloride (K-DUR) 10 MEQ tablet Take 10 mEq by mouth every morning.     . [DISCONTINUED] Calcium Carbonate-Vitamin D (CALCIUM + D PO) Take 1 tablet by mouth daily.     No current facility-administered medications on file prior to visit.    BP 136/80 mmHg  Pulse  97  Temp(Src) 98.8 F (37.1 C) (Oral)  Ht 5' 5.5" (1.664 m)  Wt 166 lb (75.297 kg)  BMI 27.19 kg/m2    Objective:   Physical Exam  Constitutional: She is oriented to person, place, and time. She appears well-developed and well-nourished. No distress.  HENT:  Head: Normocephalic and atraumatic.  Right Ear: External ear normal.  Left Ear: External ear normal.  Mouth/Throat: Oropharynx is clear and moist.  Eyes: Conjunctivae and EOM are normal. Pupils are equal, round, and reactive to light.  Neck: Neck supple.  No carotid bruit  Cardiovascular: Normal rate, regular rhythm and normal heart sounds.   No murmur heard. Pulmonary/Chest: Effort normal and breath sounds normal. She has no wheezes.  Normal breast exam bilaterally - no skin retraction, nipple discharge, no mass  Abdominal: Soft. Bowel sounds are normal. There is no tenderness.  Genitourinary: No breast swelling, tenderness or discharge.  Musculoskeletal: She exhibits no edema.  Lymphadenopathy:    She has no cervical adenopathy.  Neurological: She is alert and oriented to person, place, and time. She displays normal reflexes. No cranial nerve deficit. She exhibits normal muscle tone.  Normal gait Negative straight leg raise test bilaterally  Skin: Skin is warm and dry.  Psychiatric: She has a normal mood and affect. Her behavior is normal.          Assessment & Plan:

## 2014-01-04 NOTE — Assessment & Plan Note (Signed)
68 year old [place] female complains of chronic low back pain for the last 1-2 months. Her physical exam is normal. Rule out compression fracture. Obtain x-rays of lumbar spine.

## 2014-01-08 ENCOUNTER — Ambulatory Visit (INDEPENDENT_AMBULATORY_CARE_PROVIDER_SITE_OTHER)
Admission: RE | Admit: 2014-01-08 | Discharge: 2014-01-08 | Disposition: A | Discharge status: Home / Self Care | Payer: Medicare Other | Source: Ambulatory Visit | Attending: Internal Medicine | Admitting: Internal Medicine

## 2014-01-08 DIAGNOSIS — M545 Low back pain, unspecified: Secondary | ICD-10-CM

## 2014-01-08 DIAGNOSIS — Z0000 Encounter for general adult medical examination without abnormal findings: Secondary | ICD-10-CM

## 2014-01-08 DIAGNOSIS — Z13820 Encounter for screening for osteoporosis: Secondary | ICD-10-CM

## 2014-01-08 DIAGNOSIS — G8929 Other chronic pain: Secondary | ICD-10-CM

## 2014-01-17 ENCOUNTER — Ambulatory Visit (INDEPENDENT_AMBULATORY_CARE_PROVIDER_SITE_OTHER): Payer: Medicare Other | Admitting: Family Medicine

## 2014-01-17 ENCOUNTER — Encounter: Payer: Self-pay | Admitting: Family Medicine

## 2014-01-17 VITALS — BP 136/88 | HR 90 | Ht 65.5 in | Wt 165.0 lb

## 2014-01-17 DIAGNOSIS — R29818 Other symptoms and signs involving the nervous system: Secondary | ICD-10-CM

## 2014-01-17 DIAGNOSIS — R296 Repeated falls: Secondary | ICD-10-CM

## 2014-01-17 DIAGNOSIS — R2689 Other abnormalities of gait and mobility: Secondary | ICD-10-CM

## 2014-01-17 NOTE — Assessment & Plan Note (Signed)
I do believe the patient is likely having more difficulty secondary to the weakness from improper rehabilitation of the left knee. We discussed formal physical therapy to learn home exercises, we discussed over-the-counter medications a could be beneficial. We discussed vitamin D deficiency as well as iron deficiency can be contributing. Patient is also going to check her blood pressure regularly to make sure that she is not having any hypotension that could be contributing. Patient continues to have difficulty further workup would be necessary. We discussed different diet changes as well duration. Patient come back in 3-4 weeks to make sure she is improving.

## 2014-01-17 NOTE — Progress Notes (Signed)
  [name] [address] [place], [place] [address] Phone: [phone] Subjective:    I'm seeing this patient by the request  of:  Drema Pry, DO   CC: balance difficulty  [id number] Kristina Ayala is a 68 y.o. female coming in with complaint o fbalance trouble.  Patient last year did have a tibial plateau fracture that didn't need surgical repair. Patient states she did go through rehabilitation and has improved somewhat but continues to have frequent falls. Patient denies any dizziness, lightheadedness, or any association with food or medications.patient states that it seems that her legs seem to be weak and has difficulty standing in one place for a long time or walking. Patient feels that her left leg is weaker than her right  Patient states sometimes she feels like she is pulling to her left somewhat. Denies any headache, any visual changes, any headaches or any fevers chills or any abnormal weight loss.     Past medical history, social, surgical and family history all reviewed in electronic medical record.   Review of Systems: No headache, visual changes, nausea, vomiting, diarrhea, constipation, dizziness, abdominal pain, skin rash, fevers, chills, night sweats, weight loss, swollen lymph nodes, body aches, joint swelling, muscle aches, chest pain, shortness of breath, mood changes.   Objective Blood pressure 136/88, pulse 90, height 5' 5.5" (1.664 m), weight 165 lb (74.844 kg), SpO2 98 %.  General: No apparent distress alert and oriented x3 mood and affect normal, dressed appropriately.  HEENT: Pupils equal, extraocular movements intact  Respiratory: Patient's speak in full sentences and does not appear short of breath  Cardiovascular: No lower extremity edema, non tender, no erythema  Skin: Warm dry intact with no signs of infection or rash on extremities or on axial skeleton.  Abdomen: Soft nontender  Neuro: Cranial nerves II through XII  are intact, neurovascularly intact in all extremities with 2+ DTRs and 2+ pulses.  Lymph: No lymphadenopathy of posterior or anterior cervical chain or axillae bilaterally.  Gait normal with good balance and coordination.  MSK:  Non tender with full range of motion and good stability and symmetric strength and tone of shoulders, elbows, wrist, hip, and ankles bilaterally.  Knee:left Patient's incision is well-healed. Good range of motion noted. Palpation normal with no warmth, joint line tenderness, patellar tenderness, or condyle tenderness. ROM full in flexion and extension and lower leg rotation. Ligaments with solid consistent endpoints including ACL, PCL, LCL, MCL. Negative Mcmurray's, Apley's, and Thessalonian tests. Non painful patellar compression. Patellar glide without crepitus. Patellar and quadriceps tendons unremarkable. Hamstring and quadriceps strength is normal.  Contralateral knee unremarkable Patient's balance is a negative Romberg's test. Good strength with 4 out of 5 on the left leg compared to 5 out of 5 on the right leg.    Impression and Recommendations:     This case required medical decision making of moderate complexity.

## 2014-01-17 NOTE — Patient Instructions (Addendum)
Good to meet you Happy holidays!  We will do PT to help learn some home exercises, they will call you Make sure your vitamin D 2000 IU daily.  Turmeric 500mg  twice daily.  Whey protein isolate 1-2 scoops daily can help increase muscle.  Make sure you stay well hydrated  Continue the B12 Iron 325mg  3 times a week See me again in 3-4 weeks.

## 2014-01-22 ENCOUNTER — Encounter: Payer: Self-pay | Admitting: *Deleted

## 2014-02-07 ENCOUNTER — Ambulatory Visit: Payer: Medicare Other | Admitting: Family Medicine

## 2014-02-07 ENCOUNTER — Telehealth: Payer: Self-pay | Admitting: Family Medicine

## 2014-02-07 NOTE — Telephone Encounter (Signed)
Referral for PT has been sent to [facility]. They should be contacting the pt to schedule an appt today or tomorrow. Pt made aware.

## 2014-02-07 NOTE — Telephone Encounter (Signed)
States Dr. Tamala Julian suggest her to have therapy.  She states there is an office on [address] she would like to go to but she does not know the name or contact information of the practice.  She would like a call back in regards.

## 2014-02-25 ENCOUNTER — Ambulatory Visit: Payer: Medicare Other | Admitting: Family Medicine

## 2014-02-26 ENCOUNTER — Ambulatory Visit: Payer: Medicare Other | Attending: Family Medicine

## 2014-02-26 DIAGNOSIS — E785 Hyperlipidemia, unspecified: Secondary | ICD-10-CM | POA: Insufficient documentation

## 2014-02-26 DIAGNOSIS — M545 Low back pain: Secondary | ICD-10-CM | POA: Diagnosis not present

## 2014-02-26 DIAGNOSIS — M25561 Pain in right knee: Secondary | ICD-10-CM | POA: Insufficient documentation

## 2014-02-26 DIAGNOSIS — J449 Chronic obstructive pulmonary disease, unspecified: Secondary | ICD-10-CM | POA: Diagnosis not present

## 2014-02-26 DIAGNOSIS — Z9181 History of falling: Secondary | ICD-10-CM | POA: Diagnosis not present

## 2014-02-26 DIAGNOSIS — G8929 Other chronic pain: Secondary | ICD-10-CM | POA: Diagnosis not present

## 2014-02-26 DIAGNOSIS — Z8781 Personal history of (healed) traumatic fracture: Secondary | ICD-10-CM | POA: Insufficient documentation

## 2014-02-26 DIAGNOSIS — M810 Age-related osteoporosis without current pathological fracture: Secondary | ICD-10-CM | POA: Insufficient documentation

## 2014-02-26 DIAGNOSIS — M25562 Pain in left knee: Secondary | ICD-10-CM | POA: Insufficient documentation

## 2014-02-26 DIAGNOSIS — R29898 Other symptoms and signs involving the musculoskeletal system: Secondary | ICD-10-CM | POA: Diagnosis not present

## 2014-02-26 DIAGNOSIS — I10 Essential (primary) hypertension: Secondary | ICD-10-CM | POA: Diagnosis not present

## 2014-02-26 DIAGNOSIS — R269 Unspecified abnormalities of gait and mobility: Secondary | ICD-10-CM

## 2014-02-26 DIAGNOSIS — M1990 Unspecified osteoarthritis, unspecified site: Secondary | ICD-10-CM | POA: Insufficient documentation

## 2014-02-26 NOTE — Therapy (Signed)
[facility] [address] [place], [place], [address] Phone: [phone]   Fax:  [phone]  Physical Therapy Evaluation  Patient Details  Name: Kristina Ayala MRN: [id number] Date of Birth: 10-Mar-1945 Referring Provider:  Rosine Abe, DO  Encounter Date: 02/26/2014      PT End of Session - 02/26/14 1708    Visit Number 1   Number of Visits 17   Date for PT Re-Evaluation 04/27/14   Authorization Type G-code every 10th visit. Medicaid in secondary per pt.   PT Start Time 1100   PT Stop Time 1147   PT Time Calculation (min) 47 min   Equipment Utilized During Treatment Gait belt   Activity Tolerance Patient tolerated treatment well   Behavior During Therapy WFL for tasks assessed/performed      Past Medical History  Diagnosis Date  . Hypertension   . Arthritis   . Bursitis     left elbow  . Infectious colitis   . Clostridium difficile infection   . HLD (hyperlipidemia)   . Frequent falls     Past Surgical History  Procedure Laterality Date  . Tonsillectomy    . Orif proximal tibial plateau fracture Left 02/19/2012  . Polypectomy    . Colonoscopy      There were no vitals taken for this visit.  Visit Diagnosis:  Abnormality of gait - Plan: PT plan of care cert/re-cert  Left leg weakness - Plan: PT plan of care cert/re-cert      Subjective Assessment - 02/26/14 1109    Symptoms Impaired balance, L LE weakness, falls, occasional L LE numbness/tingling (pt believes this is from restless leg syndrome)   Limitations Sitting;Standing   How long can you sit comfortably? 15 minutes   How long can you stand comfortably? 15 minutes   Patient Stated Goals Walk in a straight line, improve L LE strength,walk without falling, walk around track at [facility] PT Assessment - 02/26/14 1114    Assessment   Medical Diagnosis Impaired balance with history of L tibial plateau fracture   Onset  Date 02/07/13   Prior Therapy [facility] for approx. 1 month after surgery to repair L tibial plateau fracture   Precautions   Precautions Fall   Restrictions   Weight Bearing Restrictions No   Balance Screen   Has the patient fallen in the past 6 months Yes   How many times? 1   Has the patient had a decrease in activity level because of a fear of falling?  No   Is the patient reluctant to leave their home because of a fear of falling?  No   Home Environment   Living Enviornment Private residence   Living Arrangements Alone   Available Help at Discharge Family  family lives there occasionally   Type of Home Other(Comment)  [place] to enter   Entrance Stairs-Number of Steps 4   Entrance Stairs-Rails Can reach both   [place] Two level   Alternate Level Stairs-Number of Steps 12  pt has a chair to go up the indoor stairs   Alternate Level Stairs-Rails Right   Home Equipment Other (comment);Cane - quad  walking stick, chair for stairs   Prior Function   Level of Independence Independent with basic ADLs;Independent with homemaking with ambulation;Independent with gait;Independent with transfers   [place] Retired   [employer], travel, going  to gym   Cognition   Overall Cognitive Status Within Functional Limits for tasks assessed   Sensation   Light Touch Appears Intact   Coordination   Gross Motor Movements are Fluid and Coordinated Yes   Fine Motor Movements are Fluid and Coordinated Yes   Posture/Postural Control   Posture/Postural Control Postural limitations   Postural Limitations Forward head;Rounded Shoulders   AROM   Overall AROM  Within functional limits for tasks performed   Strength   Overall Strength Deficits   Overall Strength Comments B LE: 4/5, except for 3+/5 L hip abductors and L hip flexors   Transfers   Transfers Sit to Stand;Stand to Sit   Sit to Stand 5: Supervision;With upper extremity  assist;From chair/3-in-1   Stand to Sit 5: Supervision;With upper extremity assist;To chair/3-in-1   Ambulation/Gait   Ambulation/Gait Yes   Ambulation/Gait Assistance 5: Supervision   Ambulation/Gait Assistance Details Pt ambulated over even terrain with no LOB noted.   Ambulation Distance (Feet) 100 Feet   Assistive device None   Gait Pattern Step-through pattern;Decreased stride length;Decreased trunk rotation  Decr.hip rotation,decr. eccentric control during swing phase   Ambulation Surface Level;Indoor   Gait velocity 3.67ft/sec.  No AD   Standardized Balance Assessment   Standardized Balance Assessment Timed Up and Go Test;Berg Balance Test   Berg Balance Test   Sit to Stand Able to stand  independently using hands   Standing Unsupported Able to stand safely 2 minutes   Sitting with Back Unsupported but Feet Supported on Floor or Stool Able to sit safely and securely 2 minutes   Stand to Sit Controls descent by using hands   Transfers Able to transfer safely, definite need of hands   Standing Unsupported with Eyes Closed Able to stand 10 seconds with supervision   Standing Ubsupported with Feet Together Able to place feet together independently and stand for 1 minute with supervision   From Standing, Reach Forward with Outstretched Arm Can reach forward >12 cm safely (5")  5"   From Standing Position, Pick up Object from [place] to pick up shoe, needs supervision   From Standing Position, Turn to Look Behind Over each Shoulder Looks behind one side only/other side shows less weight shift   Turn 360 Degrees Able to turn 360 degrees safely but slowly   Standing Unsupported, Alternately Place Feet on Step/Stool Able to complete 4 steps without aid or supervision   Standing Unsupported, One Foot in Front Able to plae foot ahead of the other independently and hold 30 seconds   Standing on One Leg Tries to lift leg/unable to hold 3 seconds but remains standing independently   Total  Score 40   Timed Up and Go Test   TUG Normal TUG   Normal TUG (seconds) 9.21  No AD                            PT Short Term Goals - 02/26/14 1711    PT SHORT TERM GOAL #1   Title Pt will be independent in HEP to improve functional mobility. Target date: 03/26/14.   Status New   PT SHORT TERM GOAL #2   Title Pt will improve BERG balance score to >/= 44/56 to decresae falls risk. Target date: 03/26/14.   Status New   PT SHORT TERM GOAL #3   Title Pt will report no falls over the last 2 weeks to improve safety during functional  mobility. Target date: 03/26/14.   Status New   PT SHORT TERM GOAL #4   Title Pt will report she is able to stand for 20 minutes without fatigue, in order to walk around [facility] track. Target date: 03/26/14.   Status New   PT SHORT TERM GOAL #5   Title PT will ambulated 300' over even/uneven terrain, with supervision, to improve functional mobility. Target date: 04/23/14.   Status New           PT Long Term Goals - Mar 07, 2014 1714    PT LONG TERM GOAL #1   Title Pt will verbalize understanding of falls prevention strategies to reduce falls risk. Target date: 04/23/14.   Status New   PT LONG TERM GOAL #2   Title Pt will improve BERG balance score to >/=48/56 to reduce falls risk. Target date: 04/23/14.   Status New   PT LONG TERM GOAL #3   Title Have pt complete FOTO and write LTG. Target date: 04/23/14.   Status New   PT LONG TERM GOAL #4   Title Pt will verbalize plans to join [employer] silver sneakers program to maintain gains made in PT. Target date: 04/23/14.   Status New   PT LONG TERM GOAL #5   Title PT will ambulated 600' over even/uneven terrain, independently, to improve functional mobility. Target date: 04/23/14.   Status New               Plan - 03-07-14 1107    Clinical Impression Statement Pt is a pleasant 69 y/o female presenting OPPT neuro with impaired balance. Pt with history of L tibial plateau fracture 01/2013, which pt  believes it is still weak and that is why she is experiencing impaired balance. Pt reported she has experienced one fall in the last six months due to impaired balance.  Pt reported she has restless leg syndrome,  but it is not listed in her medical history. Pt also has chronic low back and B knee pain.   Pt will benefit from skilled therapeutic intervention in order to improve on the following deficits Abnormal gait;Decreased endurance;Decreased knowledge of use of DME;Decreased balance;Decreased strength;Decreased mobility   Rehab Potential Good   PT Frequency 2x / week   PT Duration 8 weeks   PT Treatment/Interventions ADLs/Self Care Home Management;Gait training;Neuromuscular re-education;Stair training;Biofeedback;Functional mobility training;Patient/family education;Therapeutic activities;Manual techniques;Therapeutic exercise;Electrical Stimulation;DME Instruction;Balance training   PT Next Visit Plan Initiate balance and strengthening HEP.   Consulted and Agree with Plan of Care Patient          G-Codes - Mar 07, 2014 1719    Functional Assessment Tool Used BERG: 40/56; gait speed no AD: 3.3ft/sec.; TUG no AD: 9.21 sec.   Functional Limitation Mobility: Walking and moving around   Mobility: Walking and Moving Around Current Status [phone]) At least 20 percent but less than 40 percent impaired, limited or restricted   Mobility: Walking and Moving Around Goal Status [phone]) At least 1 percent but less than 20 percent impaired, limited or restricted       Problem List Patient Active Problem List   Diagnosis Date Noted  . Preventative health care 01/04/2014  . Low back pain 01/04/2014  . Bilateral knee pain 01/04/2014  . Diverticulosis of colon without hemorrhage 07/26/2013  . Diarrhea 07/26/2013  . Trigger thumb of left hand 03/06/2013  . Dyslipidemia 02/15/2013  . Essential hypertension, benign 02/15/2013  . Hyperlipidemia 09/28/2012  . COPD (chronic obstructive pulmonary disease)  05/22/2012  . Frequent falls  05/22/2012  . Tobacco use disorder 05/22/2012  . Osteoporosis, unspecified 05/22/2012  . Chest pain 11/12/2011  . Hypertension 08/04/2011    Llewellyn Choplin L 02/26/2014, 5:21 PM  [facility] [address] [place], [place], [address] Phone: [phone]   Fax:  [phone]    Geoffry Paradise, PT,DPT 02/26/2014 5:21 PM Phone: [phone] Fax: [phone]

## 2014-02-27 NOTE — Addendum Note (Signed)
Addended by: Elza Rafter on: 02/27/2014 08:30 AM   Modules accepted: Medications

## 2014-02-28 ENCOUNTER — Encounter: Payer: Self-pay | Admitting: Physical Therapy

## 2014-02-28 ENCOUNTER — Ambulatory Visit: Payer: Medicare Other | Admitting: Physical Therapy

## 2014-02-28 DIAGNOSIS — R29898 Other symptoms and signs involving the musculoskeletal system: Secondary | ICD-10-CM

## 2014-02-28 DIAGNOSIS — J449 Chronic obstructive pulmonary disease, unspecified: Secondary | ICD-10-CM | POA: Diagnosis not present

## 2014-02-28 DIAGNOSIS — M1990 Unspecified osteoarthritis, unspecified site: Secondary | ICD-10-CM | POA: Diagnosis not present

## 2014-02-28 DIAGNOSIS — G8929 Other chronic pain: Secondary | ICD-10-CM | POA: Diagnosis not present

## 2014-02-28 DIAGNOSIS — M545 Low back pain: Secondary | ICD-10-CM | POA: Diagnosis not present

## 2014-02-28 DIAGNOSIS — R269 Unspecified abnormalities of gait and mobility: Secondary | ICD-10-CM

## 2014-02-28 DIAGNOSIS — I10 Essential (primary) hypertension: Secondary | ICD-10-CM | POA: Diagnosis not present

## 2014-02-28 DIAGNOSIS — Z8781 Personal history of (healed) traumatic fracture: Secondary | ICD-10-CM | POA: Diagnosis not present

## 2014-02-28 DIAGNOSIS — M25562 Pain in left knee: Secondary | ICD-10-CM | POA: Diagnosis not present

## 2014-02-28 DIAGNOSIS — E785 Hyperlipidemia, unspecified: Secondary | ICD-10-CM | POA: Diagnosis not present

## 2014-02-28 DIAGNOSIS — M25561 Pain in right knee: Secondary | ICD-10-CM | POA: Diagnosis not present

## 2014-02-28 DIAGNOSIS — Z9181 History of falling: Secondary | ICD-10-CM | POA: Diagnosis not present

## 2014-02-28 DIAGNOSIS — M810 Age-related osteoporosis without current pathological fracture: Secondary | ICD-10-CM | POA: Diagnosis not present

## 2014-02-28 NOTE — Therapy (Signed)
[facility] [address] [place], [place], [address] Phone: [phone]   Fax:  [phone]  Physical Therapy Treatment  Patient Details  Name: Kristina Ayala MRN: [id number] Date of Birth: 10-25-1945 Referring Provider:  Rosine Abe, DO  Encounter Date: 02/28/2014      PT End of Session - 02/28/14 1502    Visit Number 2   Number of Visits 17   Date for PT Re-Evaluation 04/27/14   Authorization Type G-code every 10th visit. Medicaid in secondary per pt.   PT Start Time 1316   PT Stop Time 1402   PT Time Calculation (min) 46 min   Activity Tolerance Patient tolerated treatment well   Behavior During Therapy WFL for tasks assessed/performed      Past Medical History  Diagnosis Date  . Hypertension   . Arthritis   . Bursitis     left elbow  . Infectious colitis   . Clostridium difficile infection   . HLD (hyperlipidemia)   . Frequent falls     Past Surgical History  Procedure Laterality Date  . Tonsillectomy    . Orif proximal tibial plateau fracture Left 02/19/2012  . Polypectomy    . Colonoscopy      There were no vitals taken for this visit.  Visit Diagnosis:  Abnormality of gait  Left leg weakness      Subjective Assessment - 02/28/14 1322    Symptoms Pt reports LOB today in grocery store while turning head and walking.  Denies falls since last visit.   Currently in Pain? No/denies                    [facility] Adult PT Treatment/Exercise - 02/28/14 1457    Knee/Hip Exercises: Aerobic   Stationary Bike Scifit level 1.5 all 4 extremities x 13 minutes   Knee/Hip Exercises: Standing   Other Standing Knee Exercises bil hip extension, marching, abduction, SLR x 15 reps   Other Standing Knee Exercises side stepping at counter x 8 x 2             Balance Exercises - 02/28/14 1455    Balance Exercises: Standing   Standing Eyes Opened Narrow base of support (BOS);Wide  (BOA);Head turns;Foam/compliant surface;Solid surface;2 reps;10 secs   Standing Eyes Closed Narrow base of support (BOS);Wide (BOA);Head turns;Foam/compliant surface;Solid surface;2 reps;10 secs   Tandem Stance Eyes open;Upper extremity support 1;3 reps;10 secs   SLS Eyes open;Solid surface;Upper extremity support 1;3 reps;10 secs           PT Education - 02/28/14 1501    Education provided Yes   Education Details HEP   Person(s) Educated Patient   Methods Explanation;Demonstration;Handout   Comprehension Verbalized understanding;Need further instruction          PT Short Term Goals - 02/26/14 1711    PT SHORT TERM GOAL #1   Title Pt will be independent in HEP to improve functional mobility. Target date: 03/26/14.   Status New   PT SHORT TERM GOAL #2   Title Pt will improve BERG balance score to >/= 44/56 to decresae falls risk. Target date: 03/26/14.   Status New   PT SHORT TERM GOAL #3   Title Pt will report no falls over the last 2 weeks to improve safety during functional mobility. Target date: 03/26/14.   Status New   PT SHORT TERM GOAL #4   Title Pt will report she is able to stand for 20  minutes without fatigue, in order to walk around [employer] [facility] [facility] track. Target date: 03/26/14.   Status New   PT SHORT TERM GOAL #5   Title PT will ambulated 300' over even/uneven terrain, with supervision, to improve functional mobility. Target date: 04/23/14.   Status New           PT Long Term Goals - 02/26/14 1714    PT LONG TERM GOAL #1   Title Pt will verbalize understanding of falls prevention strategies to reduce falls risk. Target date: 04/23/14.   Status New   PT LONG TERM GOAL #2   Title Pt will improve BERG balance score to >/=48/56 to reduce falls risk. Target date: 04/23/14.   Status New   PT LONG TERM GOAL #3   Title Have pt complete FOTO and write LTG. Target date: 04/23/14.   Status New   PT LONG TERM GOAL #4   Title Pt will verbalize plans to join [employer] silver sneakers program  to maintain gains made in PT. Target date: 04/23/14.   Status New   PT LONG TERM GOAL #5   Title PT will ambulated 600' over even/uneven terrain, independently, to improve functional mobility. Target date: 04/23/14.   Status New               Plan - 02/28/14 1502    Clinical Impression Statement Initiated HEP for balance and strengthening.  Continue PT per POC.   Pt will benefit from skilled therapeutic intervention in order to improve on the following deficits Abnormal gait;Decreased endurance;Decreased knowledge of use of DME;Decreased balance;Decreased strength;Decreased mobility   Rehab Potential Good   PT Frequency 2x / week   PT Duration 8 weeks   PT Treatment/Interventions ADLs/Self Care Home Management;Gait training;Neuromuscular re-education;Stair training;Biofeedback;Functional mobility training;Patient/family education;Therapeutic activities;Manual techniques;Therapeutic exercise;Electrical Stimulation;DME Instruction;Balance training   PT Next Visit Plan Review HEP.   Consulted and Agree with Plan of Care Patient        Problem List Patient Active Problem List   Diagnosis Date Noted  . Preventative health care 01/04/2014  . Low back pain 01/04/2014  . Bilateral knee pain 01/04/2014  . Diverticulosis of colon without hemorrhage 07/26/2013  . Diarrhea 07/26/2013  . Trigger thumb of left hand 03/06/2013  . Dyslipidemia 02/15/2013  . Essential hypertension, benign 02/15/2013  . Hyperlipidemia 09/28/2012  . COPD (chronic obstructive pulmonary disease) 05/22/2012  . Frequent falls 05/22/2012  . Tobacco use disorder 05/22/2012  . Osteoporosis, unspecified 05/22/2012  . Chest pain 11/12/2011  . Hypertension 08/04/2011    Narda Bonds 02/28/2014, 3:03 PM  [facility] [address] [place], [place], [address] Phone: [phone]   Fax:  [place], [place] 02/28/2014 3:03 PM Phone: [phone] Fax: [phone]

## 2014-02-28 NOTE — Patient Instructions (Signed)
EXTENSION: Standing (Active)   Stand, both feet flat. Draw right leg behind body as far as possible.  Complete 15 repetitions. Perform 2 sessions per day.  http://gtsc.exer.us/77   Copyright  VHI. All rights reserved.  "I love a Parade" Lift   Using a chair if necessary, march in place as high as you can. Repeat 15 times. Do 2 sessions per day.  http://gt2.exer.us/345   Copyright  VHI. All rights reserved.  ABDUCTION: Standing (Active)   Stand, feet flat. Lift right leg out to side.  Complete 15 repetitions. Perform 2 sessions per day.  http://gtsc.exer.us/111   Copyright  VHI. All rights reserved.  SINGLE LIMB STANCE   Stance: single leg on floor. Raise leg. Hold 10 seconds. Repeat with other leg. 3 reps per set, 2 sets per day.  Copyright  VHI. All rights reserved.  Tandem StanceFeet Together (Compliant Surface) Head Motion - Eyes Closed   Stand on compliant surface: pillow with feet together. Close eyes and move head slowly, up and down. Repeat 10 times per session. Do 2 sessions per day.  Copyright  VHI. All rights reserved.     Right foot in front of left, heel touching toe both feet "straight ahead". Stand on Foot Triangle of Support with both feet. Balance in this position 10 seconds. Do with left foot in front of right.  Repeat 3 times each side.  Copyright  VHI. All rights reserved.  Balance: Eyes Closed - Bilateral (Varied Surfaces)   Stand, feet shoulder width, close eyes. Maintain balance 10 seconds. Repeat 3 times per set. Do 2 sets per day. Repeat on compliant surface: foam.  Copyright  VHI. All rights reserved.  Balance: Neck Rotation, Eyes Closed - Bilateral (Varied Surfaces)   Stand, feet shoulder width, close eyes. Balance and turn head and neck from side to side. Repeat 10 times per set. Do 2 sets per day.  Repeat on compliant surface: foam.  Copyright  VHI. All rights reserved.  Feet Apart (Compliant Surface) Head Motion -  Eyes Closed   Stand on compliant surface: pillow with feet shoulder width apart. Close eyes and move head slowly, up and down. Repeat 10 times per session. Do 2 sessions per day.  Repeat with head turns to right and left.  Copyright  VHI. All rights reserved.

## 2014-03-05 ENCOUNTER — Ambulatory Visit: Payer: Medicare Other

## 2014-03-05 DIAGNOSIS — G8929 Other chronic pain: Secondary | ICD-10-CM | POA: Diagnosis not present

## 2014-03-05 DIAGNOSIS — E785 Hyperlipidemia, unspecified: Secondary | ICD-10-CM | POA: Diagnosis not present

## 2014-03-05 DIAGNOSIS — Z8781 Personal history of (healed) traumatic fracture: Secondary | ICD-10-CM | POA: Diagnosis not present

## 2014-03-05 DIAGNOSIS — R29898 Other symptoms and signs involving the musculoskeletal system: Secondary | ICD-10-CM

## 2014-03-05 DIAGNOSIS — M25561 Pain in right knee: Secondary | ICD-10-CM | POA: Diagnosis not present

## 2014-03-05 DIAGNOSIS — M810 Age-related osteoporosis without current pathological fracture: Secondary | ICD-10-CM | POA: Diagnosis not present

## 2014-03-05 DIAGNOSIS — Z9181 History of falling: Secondary | ICD-10-CM | POA: Diagnosis not present

## 2014-03-05 DIAGNOSIS — M545 Low back pain: Secondary | ICD-10-CM | POA: Diagnosis not present

## 2014-03-05 DIAGNOSIS — I10 Essential (primary) hypertension: Secondary | ICD-10-CM | POA: Diagnosis not present

## 2014-03-05 DIAGNOSIS — J449 Chronic obstructive pulmonary disease, unspecified: Secondary | ICD-10-CM | POA: Diagnosis not present

## 2014-03-05 DIAGNOSIS — M1990 Unspecified osteoarthritis, unspecified site: Secondary | ICD-10-CM | POA: Diagnosis not present

## 2014-03-05 DIAGNOSIS — R269 Unspecified abnormalities of gait and mobility: Secondary | ICD-10-CM | POA: Diagnosis not present

## 2014-03-05 DIAGNOSIS — M25562 Pain in left knee: Secondary | ICD-10-CM | POA: Diagnosis not present

## 2014-03-05 NOTE — Therapy (Signed)
[facility] [address] [place], [place], [address] Phone: [phone]   Fax:  [phone]  Physical Therapy Treatment  Patient Details  Name: Kristina Ayala MRN: [id number] Date of Birth: 07-20-1945 Referring Provider:  Rosine Abe, DO  Encounter Date: 03/05/2014      PT End of Session - 03/05/14 1305    Visit Number 3   Number of Visits 17   Date for PT Re-Evaluation 04/27/14   Authorization Type G-code every 10th visit. Medicaid in secondary per pt.   PT Start Time 1150   PT Stop Time 1230   PT Time Calculation (min) 40 min   Equipment Utilized During Treatment Gait belt   Activity Tolerance Patient tolerated treatment well   Behavior During Therapy WFL for tasks assessed/performed      Past Medical History  Diagnosis Date  . Hypertension   . Arthritis   . Bursitis     left elbow  . Infectious colitis   . Clostridium difficile infection   . HLD (hyperlipidemia)   . Frequent falls     Past Surgical History  Procedure Laterality Date  . Tonsillectomy    . Orif proximal tibial plateau fracture Left 02/19/2012  . Polypectomy    . Colonoscopy      There were no vitals taken for this visit.  Visit Diagnosis:  Abnormality of gait  Left leg weakness      Subjective Assessment - 03/05/14 1153    Symptoms Pt denied falls or changes since last visit.    Limitations Sitting;Standing   How long can you sit comfortably? 15 minutes   How long can you stand comfortably? 15 minutes   Patient Stated Goals Walk in a straight line, improve L LE strength,walk without falling, walk around track at [facility]   Currently in Pain? No/denies                    [facility] Adult PT Treatment/Exercise - 03/05/14 1300    Balance   Balance Assessed Yes   Dynamic Standing Balance   Dynamic Standing - Balance Support No upper extremity supported   Dynamic Standing - Level of Assistance 4: Min assist;Other  (comment)  min guard   Dynamic Standing - Balance Activities Lateral lean/weight shifting;Alternating  foot traps;Eyes open;Compliant surfaces;Other (comment)  Non-compliant surfaces   Dynamic Standing - Comments Pt performed 1x5 cones taps/LE on compliant and non-compliant surfaces. VC's for technique and to improve lateral weight shifting. Pt required min A during cone taps on compliant surface.   Exercises   Exercises Knee/Hip   Knee/Hip Exercises: Standing   Other Standing Knee Exercises bil hip extension, marching, abduction, x15/LE. VC's for technique. Pt required rest breaks after each set due to fatigue. Pt used 1-2 UE support to maintain balance.             Balance Exercises - 03/05/14 1301    Balance Exercises: Standing   Standing Eyes Opened Narrow base of support (BOS);Wide (BOA);Head turns;Foam/compliant surface;Solid surface;10 secs;1 rep  Reviewed HEP.   Standing Eyes Closed Narrow base of support (BOS);Wide (BOA);Head turns;Foam/compliant surface;Solid surface;10 secs;1 rep   Tandem Stance Eyes open;10 secs;Intermittent upper extremity support;2 reps   SLS Eyes open;Solid surface;10 secs;Intermittent upper extremity support;2 reps           PT Education - 03/05/14 1304    Education provided Yes   Education Details Reviewed HEP, and updated hip abduction exercise with technique notes.  Person(s) Educated Patient   Methods Explanation;Demonstration;Tactile cues;Verbal cues;Handout   Comprehension Verbalized understanding;Returned demonstration          PT Short Term Goals - 03/05/14 1307    PT SHORT TERM GOAL #1   Title Pt will be independent in HEP to improve functional mobility. Target date: 03/26/14.   Status On-going   PT SHORT TERM GOAL #2   Title Pt will improve BERG balance score to >/= 44/56 to decresae falls risk. Target date: 03/26/14.   Status On-going   PT SHORT TERM GOAL #3   Title Pt will report no falls over the last 2 weeks to improve  safety during functional mobility. Target date: 03/26/14.   Status On-going   PT SHORT TERM GOAL #4   Title Pt will report she is able to stand for 20 minutes without fatigue, in order to walk around [facility] track. Target date: 03/26/14.   Status On-going   PT SHORT TERM GOAL #5   Title PT will ambulated 300' over even/uneven terrain, with supervision, to improve functional mobility. Target date: 03/26/14.   Status On-going           PT Long Term Goals - 03/05/14 1308    PT LONG TERM GOAL #1   Title Pt will verbalize understanding of falls prevention strategies to reduce falls risk. Target date: 04/23/14.   Status On-going   PT LONG TERM GOAL #2   Title Pt will improve BERG balance score to >/=48/56 to reduce falls risk. Target date: 04/23/14.   Status On-going   PT LONG TERM GOAL #3   Title Have pt complete FOTO and write LTG. Target date: 04/23/14.   Status On-going   PT LONG TERM GOAL #4   Title Pt will verbalize plans to join [employer] silver sneakers program to maintain gains made in PT. Target date: 04/23/14.   Status On-going   PT LONG TERM GOAL #5   Title PT will ambulated 600' over even/uneven terrain, independently, to improve functional mobility. Target date: 04/23/14.   Status On-going               Plan - 03/05/14 1305    Clinical Impression Statement Pt demonstrated progress towards goals, as she required less cues during HEP. Pt continues to experience increased postural sway when standing on compliant surface, especially with eyes closed indicating hypofunction of vestibular system. pt would continue to benefit from skilled PT to improve safety during functional mobility.   Pt will benefit from skilled therapeutic intervention in order to improve on the following deficits Abnormal gait;Decreased endurance;Decreased knowledge of use of DME;Decreased balance;Decreased strength;Decreased mobility   Rehab Potential Good   PT Frequency 2x / week   PT Duration 8 weeks   PT  Treatment/Interventions ADLs/Self Care Home Management;Gait training;Neuromuscular re-education;Stair training;Biofeedback;Functional mobility training;Patient/family education;Therapeutic activities;Manual techniques;Therapeutic exercise;Electrical Stimulation;DME Instruction;Balance training   PT Next Visit Plan Progress dynamic gait and balance training.   PT Home Exercise Plan Strength and balance HEP.   Consulted and Agree with Plan of Care Patient        Problem List Patient Active Problem List   Diagnosis Date Noted  . Preventative health care 01/04/2014  . Low back pain 01/04/2014  . Bilateral knee pain 01/04/2014  . Diverticulosis of colon without hemorrhage 07/26/2013  . Diarrhea 07/26/2013  . Trigger thumb of left hand 03/06/2013  . Dyslipidemia 02/15/2013  . Essential hypertension, benign 02/15/2013  . Hyperlipidemia 09/28/2012  . COPD (chronic obstructive pulmonary disease) 05/22/2012  .  Frequent falls 05/22/2012  . Tobacco use disorder 05/22/2012  . Osteoporosis, unspecified 05/22/2012  . Chest pain 11/12/2011  . Hypertension 08/04/2011    Lenon Kuennen L 03/05/2014, 1:08 PM  [facility] [address] [place], [place], [address] Phone: [phone]   Fax:  [phone]     Geoffry Paradise, PT,DPT 03/05/2014 1:08 PM Phone: [phone] Fax: [phone]

## 2014-03-05 NOTE — Patient Instructions (Signed)
**  For hip abduction exercise: take a half step back with kicking leg, then kick leg out to the side with knee straight. Make sure toes and hips face forward.**

## 2014-03-06 ENCOUNTER — Other Ambulatory Visit: Payer: Self-pay | Admitting: Internal Medicine

## 2014-03-07 ENCOUNTER — Ambulatory Visit: Payer: Medicare Other

## 2014-03-28 ENCOUNTER — Other Ambulatory Visit: Payer: Self-pay

## 2014-03-28 ENCOUNTER — Telehealth: Payer: Self-pay | Admitting: Family Medicine

## 2014-03-28 ENCOUNTER — Other Ambulatory Visit: Payer: Self-pay | Admitting: Internal Medicine

## 2014-03-28 DIAGNOSIS — Z1231 Encounter for screening mammogram for malignant neoplasm of breast: Secondary | ICD-10-CM

## 2014-03-28 NOTE — Telephone Encounter (Signed)
Patient asking for Iron 325mg  to be called into pharmacy because she can not locate it anywhere

## 2014-03-29 MED ORDER — FERROUS SULFATE 325 (65 FE) MG PO TABS: 325.0000 mg | ORAL_TABLET | Freq: Every day | ORAL | Status: DC

## 2014-03-29 NOTE — Telephone Encounter (Signed)
rx sent into pharmacy

## 2014-04-01 ENCOUNTER — Other Ambulatory Visit: Payer: Self-pay | Admitting: Internal Medicine

## 2014-04-02 ENCOUNTER — Other Ambulatory Visit: Payer: Self-pay | Admitting: Internal Medicine

## 2014-04-04 DIAGNOSIS — L602 Onychogryphosis: Secondary | ICD-10-CM | POA: Diagnosis not present

## 2014-04-04 DIAGNOSIS — L84 Corns and callosities: Secondary | ICD-10-CM | POA: Diagnosis not present

## 2014-04-10 NOTE — Therapy (Signed)
[facility] [address] [place], [place], [address] Phone: [phone]   Fax:  [phone]  Patient Details  Name: Kristina Ayala MRN: [id number] Date of Birth: 10/24/1945 Referring Provider:  No ref. provider found  Encounter Date: 04/10/2014  PHYSICAL THERAPY DISCHARGE SUMMARY  Visits from Start of Care: 3  Current functional level related to goals / functional outcomes:     PT Short Term Goals - 03/05/14 1307    PT SHORT TERM GOAL #1   Title Pt will be independent in HEP to improve functional mobility. Target date: 03/26/14.   Status On-going   PT SHORT TERM GOAL #2   Title Pt will improve BERG balance score to >/= 44/56 to decresae falls risk. Target date: 03/26/14.   Status On-going   PT SHORT TERM GOAL #3   Title Pt will report no falls over the last 2 weeks to improve safety during functional mobility. Target date: 03/26/14.   Status On-going   PT SHORT TERM GOAL #4   Title Pt will report she is able to stand for 20 minutes without fatigue, in order to walk around [employer] track. Target date: 03/26/14.   Status On-going   PT SHORT TERM GOAL #5   Title PT will ambulated 300' over even/uneven terrain, with supervision, to improve functional mobility. Target date: 03/26/14.   Status On-going         PT Long Term Goals - 03/05/14 1308    PT LONG TERM GOAL #1   Title Pt will verbalize understanding of falls prevention strategies to reduce falls risk. Target date: 04/23/14.   Status On-going   PT LONG TERM GOAL #2   Title Pt will improve BERG balance score to >/=48/56 to reduce falls risk. Target date: 04/23/14.   Status On-going   PT LONG TERM GOAL #3   Title Have pt complete FOTO and write LTG. Target date: 04/23/14.   Status On-going   PT LONG TERM GOAL #4   Title Pt will verbalize plans to join [employer] silver sneakers program to maintain gains made in PT. Target date: 04/23/14.   Status On-going   PT LONG TERM GOAL  #5   Title PT will ambulated 600' over even/uneven terrain, independently, to improve functional mobility. Target date: 04/23/14.   Status On-going        Remaining deficits: Unknown, as pt never returned after last PT visit. PT is discharging pt, as she never returned after cancelling her last PT appt. On 03/07/2014.   Education / Equipment: HEP  Plan: Patient agrees to discharge.  Patient goals were not met. Patient is being discharged due to not returning since the last visit.  ?????       Miller,Jennifer L 04/10/2014, 1:39 PM  [facility] [address] [place], [place], [address] Phone: [phone]   Fax:  [phone] 

## 2014-05-06 ENCOUNTER — Ambulatory Visit (INDEPENDENT_AMBULATORY_CARE_PROVIDER_SITE_OTHER): Payer: Medicare Other | Admitting: Internal Medicine

## 2014-05-06 ENCOUNTER — Encounter: Payer: Self-pay | Admitting: Internal Medicine

## 2014-05-06 VITALS — BP 130/70 | HR 96 | Temp 98.1°F | Ht 65.5 in | Wt 165.0 lb

## 2014-05-06 DIAGNOSIS — I10 Essential (primary) hypertension: Secondary | ICD-10-CM | POA: Diagnosis not present

## 2014-05-06 DIAGNOSIS — R296 Repeated falls: Secondary | ICD-10-CM | POA: Diagnosis not present

## 2014-05-06 DIAGNOSIS — Z720 Tobacco use: Secondary | ICD-10-CM | POA: Diagnosis not present

## 2014-05-06 DIAGNOSIS — F17200 Nicotine dependence, unspecified, uncomplicated: Secondary | ICD-10-CM

## 2014-05-06 LAB — BASIC METABOLIC PANEL
BUN: 15 mg/dL (ref 6–23)
CHLORIDE: 104 meq/L (ref 96–112)
CO2: 32 meq/L (ref 19–32)
CREATININE: 0.89 mg/dL (ref 0.40–1.20)
Calcium: 10.2 mg/dL (ref 8.4–10.5)
GFR: 80.61 mL/min (ref >60.00)
Glucose, Bld: 87 mg/dL (ref 70–99)
Potassium: 4 mEq/L (ref 3.5–5.1)
Sodium: 140 mEq/L (ref 135–145)

## 2014-05-06 MED ORDER — AMLODIPINE BESYLATE 5 MG PO TABS: 5.0000 mg | ORAL_TABLET | Freq: Every day | ORAL | Status: DC

## 2014-05-06 MED ORDER — LISINOPRIL 40 MG PO TABS: 40.0000 mg | ORAL_TABLET | Freq: Every morning | ORAL | Status: DC

## 2014-05-06 MED ORDER — ATORVASTATIN CALCIUM 40 MG PO TABS: 40.0000 mg | ORAL_TABLET | Freq: Every morning | ORAL | Status: DC

## 2014-05-06 MED ORDER — HYDROCHLOROTHIAZIDE 25 MG PO TABS: 25.0000 mg | ORAL_TABLET | Freq: Every morning | ORAL | Status: DC

## 2014-05-06 MED ORDER — BUPROPION HCL ER (XL) 150 MG PO TB24: 150.0000 mg | ORAL_TABLET | Freq: Every day | ORAL | Status: DC

## 2014-05-06 MED ORDER — POTASSIUM CHLORIDE ER 10 MEQ PO TBCR: 10.0000 meq | EXTENDED_RELEASE_TABLET | Freq: Every day | ORAL | Status: DC

## 2014-05-06 NOTE — Assessment & Plan Note (Signed)
Stable. Monitor electrodes in kidney function. BP: 130/70 mmHg

## 2014-05-06 NOTE — Assessment & Plan Note (Signed)
Patient continues to have issues despite PT.  Continue home exercises for now.  Consider neuro evaluation if persistent or worsening gait instability.

## 2014-05-06 NOTE — Progress Notes (Signed)
Subjective:    Patient ID: Kristina Ayala, female    DOB: 1945/02/13, 69 y.o.   MRN: [id number]  HPI  70 year old African American female with history of hypertension, tobacco use and gait instability for follow-up. Interval medical history-patient seen by sports medicine specialist. He felt her chronic knee pain may be contributing to gait issues. She underwent 3 sessions of physical therapy. She does not feel like it has made a significant difference in her balance.  Hypertension - stable  Tobacco use - Patient motivated to quit smoking. She reports using Effexor XR in the past to help her quit smoking with limited success.  Review of Systems Negative for chest pain or shortness of breath    Past Medical History  Diagnosis Date  . Hypertension   . Arthritis   . Bursitis     left elbow  . Infectious colitis   . Clostridium difficile infection   . HLD (hyperlipidemia)   . Frequent falls     History   Social History  . Marital Status: Divorced    Spouse Name: N/A  . Number of Children: 2  . Years of Education: N/A   Occupational History  . RETIRED    Social History Main Topics  . Smoking status: Current Some Day Smoker -- 0.25 packs/day for 50 years    Types: Cigarettes  . Smokeless tobacco: Never Used     Comment: 1 pack every 3 days  . Alcohol Use: No  . Drug Use: No  . Sexual Activity: Not on file   Other Topics Concern  . Not on file   Social History Narrative   Grandson (16 y/o) lives with patient.     Son lives in [place]   Daughter lives in [place]   She is originally from [place], [place]   Divorced             Past Surgical History  Procedure Laterality Date  . Tonsillectomy    . Orif proximal tibial plateau fracture Left 02/19/2012  . Polypectomy    . Colonoscopy      Family History  Problem Relation Age of Onset  . Diverticulosis Mother   . Ovarian cancer Mother   . Pancreatic cancer Father   . Lung cancer Father   . Colon  polyps Sister   . Colon cancer Neg Hx     Allergies  Allergen Reactions  . Doxycycline Diarrhea  . Minocycline Diarrhea  . Penicillins Rash    Current Outpatient Prescriptions on File Prior to Visit  Medication Sig Dispense Refill  . calcium-vitamin D (OSCAL WITH D) 500-200 MG-UNIT per tablet Take 1 tablet by mouth daily with breakfast.     . Cyanocobalamin (VITAMIN B-12) 2500 MCG SUBL Place 1 tablet under the tongue daily.    . ferrous sulfate 325 (65 FE) MG tablet Take 1 tablet (325 mg total) by mouth daily with breakfast. 30 tablet 3  . [DISCONTINUED] Calcium Carbonate-Vitamin D (CALCIUM + D PO) Take 1 tablet by mouth daily.     No current facility-administered medications on file prior to visit.    BP 130/70 mmHg  Pulse 96  Temp(Src) 98.1 F (36.7 C) (Oral)  Ht 5' 5.5" (1.664 m)  Wt 165 lb (74.844 kg)  BMI 27.03 kg/m2      Objective:   Physical Exam  Constitutional: She is oriented to person, place, and time. She appears well-developed and well-nourished.  HENT:  Head: Normocephalic and atraumatic.  Eyes: EOM are normal.  Pupils are equal, round, and reactive to light.  Neck:  No carotid bruit  Cardiovascular: Normal rate, regular rhythm and normal heart sounds.   Pulmonary/Chest: Effort normal and breath sounds normal. She has no wheezes.  Neurological: She is alert and oriented to person, place, and time. No cranial nerve deficit.  Skin: Skin is warm and dry.  Psychiatric: She has a normal mood and affect. Her behavior is normal.          Assessment & Plan:

## 2014-05-06 NOTE — Assessment & Plan Note (Signed)
Refilled Wellbutrin XL.  Smoking cessation strongly encouraged.

## 2014-05-20 ENCOUNTER — Ambulatory Visit
Admission: RE | Admit: 2014-05-20 | Discharge: 2014-05-20 | Disposition: A | Discharge status: Home / Self Care | Payer: Medicare Other | Source: Ambulatory Visit

## 2014-05-20 DIAGNOSIS — Z1231 Encounter for screening mammogram for malignant neoplasm of breast: Secondary | ICD-10-CM | POA: Diagnosis not present

## 2014-05-27 DIAGNOSIS — S92532A Displaced fracture of distal phalanx of left lesser toe(s), initial encounter for closed fracture: Secondary | ICD-10-CM | POA: Diagnosis not present

## 2014-06-10 DIAGNOSIS — S92515D Nondisplaced fracture of proximal phalanx of left lesser toe(s), subsequent encounter for fracture with routine healing: Secondary | ICD-10-CM | POA: Diagnosis not present

## 2014-06-27 DIAGNOSIS — S92515D Nondisplaced fracture of proximal phalanx of left lesser toe(s), subsequent encounter for fracture with routine healing: Secondary | ICD-10-CM | POA: Diagnosis not present

## 2014-07-05 DIAGNOSIS — L602 Onychogryphosis: Secondary | ICD-10-CM | POA: Diagnosis not present

## 2014-07-05 DIAGNOSIS — L84 Corns and callosities: Secondary | ICD-10-CM | POA: Diagnosis not present

## 2014-07-08 ENCOUNTER — Ambulatory Visit: Payer: Medicare Other | Admitting: Internal Medicine

## 2014-07-10 ENCOUNTER — Ambulatory Visit (INDEPENDENT_AMBULATORY_CARE_PROVIDER_SITE_OTHER): Payer: Medicare Other | Admitting: Adult Health

## 2014-07-10 ENCOUNTER — Encounter: Payer: Self-pay | Admitting: Adult Health

## 2014-07-10 VITALS — BP 124/82 | Temp 98.8°F | Ht 65.5 in | Wt 167.3 lb

## 2014-07-10 DIAGNOSIS — Z720 Tobacco use: Secondary | ICD-10-CM

## 2014-07-10 DIAGNOSIS — F419 Anxiety disorder, unspecified: Secondary | ICD-10-CM | POA: Diagnosis not present

## 2014-07-10 DIAGNOSIS — Z716 Tobacco abuse counseling: Secondary | ICD-10-CM

## 2014-07-10 MED ORDER — ESCITALOPRAM OXALATE 10 MG PO TABS: 10.0000 mg | ORAL_TABLET | Freq: Every day | ORAL | Status: DC

## 2014-07-10 NOTE — Progress Notes (Signed)
Pre visit review using our clinic review tool, if applicable. No additional management support is needed unless otherwise documented below in the visit note. 

## 2014-07-10 NOTE — Progress Notes (Signed)
Subjective:    Patient ID: Kristina Ayala, female    DOB: 11-22-1945, 69 y.o.   MRN: [id number]  HPI  Patient presents to the office today for two month follow up. She is a patient of Dr. Shawna Orleans, and I am seeing her in his absence. During her last visit she was started on Wellbutrin for smoking cessation. She endorses that the Wellbutrin made her tongue swell up so she stopped taking it. She continues to smoke " I only smoke six cigarettes a day.   She continues to have anxiety and has used Effexor XR in the past. She would like to go back on an anxiety medication. Her anxiety is not every day but that it has becoming more frequent.   We reviewed labs taken during last visit.  Review of Systems  Constitutional: Negative.   HENT: Negative.   Eyes: Negative.   Respiratory: Negative.   Cardiovascular: Negative.   Psychiatric/Behavioral: Negative.   All other systems reviewed and are negative.  Past Medical History  Diagnosis Date  . Hypertension   . Arthritis   . Bursitis     left elbow  . Infectious colitis   . Clostridium difficile infection   . HLD (hyperlipidemia)   . Frequent falls     History   Social History  . Marital Status: Divorced    Spouse Name: N/A  . Number of Children: 2  . Years of Education: N/A   Occupational History  . RETIRED    Social History Main Topics  . Smoking status: Current Some Day Smoker -- 0.25 packs/day for 50 years    Types: Cigarettes  . Smokeless tobacco: Never Used     Comment: 1 pack every 3 days  . Alcohol Use: No  . Drug Use: No  . Sexual Activity: Not on file   Other Topics Concern  . Not on file   Social History Narrative   Grandson (51 y/o) lives with patient.     Son lives in [place]   Daughter lives in [place]   She is originally from [place], [place]   Divorced             Past Surgical History  Procedure Laterality Date  . Tonsillectomy    . Orif proximal tibial plateau fracture Left 02/19/2012  .  Polypectomy    . Colonoscopy      Family History  Problem Relation Age of Onset  . Diverticulosis Mother   . Ovarian cancer Mother   . Pancreatic cancer Father   . Lung cancer Father   . Colon polyps Sister   . Colon cancer Neg Hx     Allergies  Allergen Reactions  . Doxycycline Diarrhea  . Minocycline Diarrhea  . Wellbutrin [Bupropion] Swelling    Per pt her tongue was swollen and sore//symptoms stopped once the medication was discontinued.   [name] Penicillins Rash    Current Outpatient Prescriptions on File Prior to Visit  Medication Sig Dispense Refill  . amLODipine (NORVASC) 5 MG tablet Take 1 tablet (5 mg total) by mouth daily. 90 tablet 1  . atorvastatin (LIPITOR) 40 MG tablet Take 1 tablet (40 mg total) by mouth every morning. 90 tablet 1  . calcium-vitamin D (OSCAL WITH D) 500-200 MG-UNIT per tablet Take 1 tablet by mouth daily with breakfast.     . cholecalciferol (VITAMIN D) 1000 UNITS tablet Take 1,000 Units by mouth daily.    . Cyanocobalamin (VITAMIN B-12) 2500 MCG SUBL Place 1 tablet  under the tongue daily.    . diclofenac sodium (VOLTAREN) 1 % GEL Apply 2 g topically daily as needed.    . hydrochlorothiazide (HYDRODIURIL) 25 MG tablet Take 1 tablet (25 mg total) by mouth every morning. 90 tablet 1  . lisinopril (PRINIVIL,ZESTRIL) 40 MG tablet Take 1 tablet (40 mg total) by mouth every morning. 90 tablet 1  . potassium chloride (K-DUR) 10 MEQ tablet Take 1 tablet (10 mEq total) by mouth daily. 90 tablet 1  . ferrous sulfate 325 (65 FE) MG tablet Take 1 tablet (325 mg total) by mouth daily with breakfast. (Patient not taking: Reported on 07/10/2014) 30 tablet 3  . [DISCONTINUED] Calcium Carbonate-Vitamin D (CALCIUM + D PO) Take 1 tablet by mouth daily.     No current facility-administered medications on file prior to visit.    BP 124/82 mmHg  Temp(Src) 98.8 F (37.1 C) (Oral)  Ht 5' 5.5" (1.664 m)  Wt 167 lb 4.8 oz (75.887 kg)  BMI 27.41 kg/m2         Objective:   Physical Exam  Constitutional: She is oriented to person, place, and time. She appears well-developed and well-nourished. No distress.  Eyes: Right eye exhibits no discharge. Left eye exhibits no discharge.  Cardiovascular: Normal rate, regular rhythm, normal heart sounds and intact distal pulses.  Exam reveals no gallop and no friction rub.   No murmur heard. Pulmonary/Chest: Effort normal and breath sounds normal. No respiratory distress. She has no wheezes. She has no rales. She exhibits no tenderness.  Musculoskeletal: Normal range of motion.  Neurological: She is alert and oriented to person, place, and time.  Skin: Skin is warm and dry. She is not diaphoretic.  Psychiatric: She has a normal mood and affect. Her behavior is normal. Judgment and thought content normal.  Nursing note and vitals reviewed.      Assessment & Plan:  1. Anxiety disorder, unspecified anxiety disorder type - escitalopram (LEXAPRO) 10 MG tablet; Take 1 tablet (10 mg total) by mouth daily.  Dispense: 30 tablet; Refill: 3 - Follow up in 1.5 months or sooner if needed 2. Encounter for smoking cessation counseling - Trial nicotine gum - Spent 3 minutes on smoking cessation counceling

## 2014-07-10 NOTE — Patient Instructions (Addendum)
Start taking the Lexapro for anxiety. Take this medication every day.  Start using the gum to help you quit smoking.  Follow up with me in a month and a half   Smoking Cessation Quitting smoking is important to your health and has many advantages. However, it is not always easy to quit since nicotine is a very addictive drug. Oftentimes, people try 3 times or more before being able to quit. This document explains the best ways for you to prepare to quit smoking. Quitting takes hard work and a lot of effort, but you can do it. ADVANTAGES OF QUITTING SMOKING  You will live longer, feel better, and live better.  Your body will feel the impact of quitting smoking almost immediately.  Within 20 minutes, blood pressure decreases. Your pulse returns to its normal level.  After 8 hours, carbon monoxide levels in the blood return to normal. Your oxygen level increases.  After 24 hours, the chance of having a heart attack starts to decrease. Your breath, hair, and body stop smelling like smoke.  After 48 hours, damaged nerve endings begin to recover. Your sense of taste and smell improve.  After 72 hours, the body is virtually free of nicotine. Your bronchial tubes relax and breathing becomes easier.  After 2 to 12 weeks, lungs can hold more air. Exercise becomes easier and circulation improves.  The risk of having a heart attack, stroke, cancer, or lung disease is greatly reduced.  After 1 year, the risk of coronary heart disease is cut in half.  After 5 years, the risk of stroke falls to the same as a nonsmoker.  After 10 years, the risk of lung cancer is cut in half and the risk of other cancers decreases significantly.  After 15 years, the risk of coronary heart disease drops, usually to the level of a nonsmoker.  If you are pregnant, quitting smoking will improve your chances of having a healthy baby.  The people you live with, especially any children, will be healthier.  You will  have extra money to spend on things other than cigarettes. QUESTIONS TO THINK ABOUT BEFORE ATTEMPTING TO QUIT You may want to talk about your answers with your health care provider.  Why do you want to quit?  If you tried to quit in the past, what helped and what did not?  What will be the most difficult situations for you after you quit? How will you plan to handle them?  Who can help you through the tough times? Your family? Friends? A health care provider?  What pleasures do you get from smoking? What ways can you still get pleasure if you quit? Here are some questions to ask your health care provider:  How can you help me to be successful at quitting?  What medicine do you think would be best for me and how should I take it?  What should I do if I need more help?  What is smoking withdrawal like? How can I get information on withdrawal? GET READY  Set a quit date.  Change your environment by getting rid of all cigarettes, ashtrays, matches, and lighters in your home, car, or work. Do not let people smoke in your home.  Review your past attempts to quit. Think about what worked and what did not. GET SUPPORT AND ENCOURAGEMENT You have a better chance of being successful if you have help. You can get support in many ways.  Tell your family, friends, and coworkers that you  are going to quit and need their support. Ask them not to smoke around you.  Get individual, group, or telephone counseling and support. Programs are available at [employer] and health centers. Call your local health department for information about programs in your area.  Spiritual beliefs and practices may help some smokers quit.  Download a "quit meter" on your computer to keep track of quit statistics, such as how long you have gone without smoking, cigarettes not smoked, and money saved.  Get a self-help book about quitting smoking and staying off tobacco. Liberty yourself from urges to smoke. Talk to someone, go for a walk, or occupy your time with a task.  Change your normal routine. Take a different route to work. Drink tea instead of coffee. Eat breakfast in a different place.  Reduce your stress. Take a hot bath, exercise, or read a book.  Plan something enjoyable to do every day. Reward yourself for not smoking.  Explore interactive web-based programs that specialize in helping you quit. GET MEDICINE AND USE IT CORRECTLY Medicines can help you stop smoking and decrease the urge to smoke. Combining medicine with the above behavioral methods and support can greatly increase your chances of successfully quitting smoking.  Nicotine replacement therapy helps deliver nicotine to your body without the negative effects and risks of smoking. Nicotine replacement therapy includes nicotine gum, lozenges, inhalers, nasal sprays, and skin patches. Some may be available over-the-counter and others require a prescription.  Antidepressant medicine helps people abstain from smoking, but how this works is unknown. This medicine is available by prescription.  Nicotinic receptor partial agonist medicine simulates the effect of nicotine in your brain. This medicine is available by prescription. Ask your health care provider for advice about which medicines to use and how to use them based on your health history. Your health care provider will tell you what side effects to look out for if you choose to be on a medicine or therapy. Carefully read the information on the package. Do not use any other product containing nicotine while using a nicotine replacement product.  RELAPSE OR DIFFICULT SITUATIONS Most relapses occur within the first 3 months after quitting. Do not be discouraged if you start smoking again. Remember, most people try several times before finally quitting. You may have symptoms of withdrawal because your body is used to nicotine. You  may crave cigarettes, be irritable, feel very hungry, cough often, get headaches, or have difficulty concentrating. The withdrawal symptoms are only temporary. They are strongest when you first quit, but they will go away within 10-14 days. To reduce the chances of relapse, try to:  Avoid drinking alcohol. Drinking lowers your chances of successfully quitting.  Reduce the amount of caffeine you consume. Once you quit smoking, the amount of caffeine in your body increases and can give you symptoms, such as a rapid heartbeat, sweating, and anxiety.  Avoid smokers because they can make you want to smoke.  Do not let weight gain distract you. Many smokers will gain weight when they quit, usually less than 10 pounds. Eat a healthy diet and stay active. You can always lose the weight gained after you quit.  Find ways to improve your mood other than smoking. FOR MORE INFORMATION  www.smokefree.gov  Document Released: 01/05/2001 Document Revised: 05/28/2013 Document Reviewed: 04/22/2011 [facility] Patient Information 2015 [place], [place]. This information is not intended to replace advice given to you by your health care provider. Make  sure you discuss any questions you have with your health care provider.  

## 2014-07-16 DIAGNOSIS — E119 Type 2 diabetes mellitus without complications: Secondary | ICD-10-CM | POA: Diagnosis not present

## 2014-07-29 ENCOUNTER — Emergency Department (HOSPITAL_COMMUNITY)
Admission: EM | Admit: 2014-07-29 | Discharge: 2014-07-29 | Disposition: A | Discharge status: Home / Self Care | Payer: Medicare Other | Attending: Emergency Medicine | Admitting: Emergency Medicine

## 2014-07-29 ENCOUNTER — Encounter (HOSPITAL_COMMUNITY): Payer: Self-pay | Admitting: Emergency Medicine

## 2014-07-29 DIAGNOSIS — Y929 Unspecified place or not applicable: Secondary | ICD-10-CM | POA: Diagnosis not present

## 2014-07-29 DIAGNOSIS — Y999 Unspecified external cause status: Secondary | ICD-10-CM | POA: Diagnosis not present

## 2014-07-29 DIAGNOSIS — Z8739 Personal history of other diseases of the musculoskeletal system and connective tissue: Secondary | ICD-10-CM | POA: Insufficient documentation

## 2014-07-29 DIAGNOSIS — X12XXXA Contact with other hot fluids, initial encounter: Secondary | ICD-10-CM | POA: Diagnosis not present

## 2014-07-29 DIAGNOSIS — E785 Hyperlipidemia, unspecified: Secondary | ICD-10-CM | POA: Insufficient documentation

## 2014-07-29 DIAGNOSIS — T310 Burns involving less than 10% of body surface: Secondary | ICD-10-CM | POA: Diagnosis not present

## 2014-07-29 DIAGNOSIS — Y9389 Activity, other specified: Secondary | ICD-10-CM | POA: Diagnosis not present

## 2014-07-29 DIAGNOSIS — Z8619 Personal history of other infectious and parasitic diseases: Secondary | ICD-10-CM | POA: Insufficient documentation

## 2014-07-29 DIAGNOSIS — Z79899 Other long term (current) drug therapy: Secondary | ICD-10-CM | POA: Diagnosis not present

## 2014-07-29 DIAGNOSIS — I10 Essential (primary) hypertension: Secondary | ICD-10-CM | POA: Insufficient documentation

## 2014-07-29 DIAGNOSIS — Z720 Tobacco use: Secondary | ICD-10-CM | POA: Insufficient documentation

## 2014-07-29 DIAGNOSIS — Z23 Encounter for immunization: Secondary | ICD-10-CM | POA: Diagnosis not present

## 2014-07-29 DIAGNOSIS — T2122XA Burn of second degree of abdominal wall, initial encounter: Secondary | ICD-10-CM | POA: Insufficient documentation

## 2014-07-29 DIAGNOSIS — Z880 Allergy status to penicillin: Secondary | ICD-10-CM | POA: Insufficient documentation

## 2014-07-29 MED ORDER — SILVER SULFADIAZINE 1 % EX CREA: 1.0000 "application " | TOPICAL_CREAM | Freq: Two times a day (BID) | CUTANEOUS | Status: AC

## 2014-07-29 MED ORDER — SILVER SULFADIAZINE 1 % EX CREA
TOPICAL_CREAM | Freq: Once | CUTANEOUS | Status: AC
  Administered 2014-07-29: 09:00:00 via TOPICAL
  Filled 2014-07-29: qty 50

## 2014-07-29 MED ORDER — TETANUS-DIPHTH-ACELL PERTUSSIS 5-2.5-18.5 LF-MCG/0.5 IM SUSP
0.5000 mL | Freq: Once | INTRAMUSCULAR | Status: AC
  Administered 2014-07-29: 0.5 mL via INTRAMUSCULAR
  Filled 2014-07-29: qty 0.5

## 2014-07-29 NOTE — Discharge Instructions (Signed)
Burn Care Your skin is a natural barrier to infection. It is the largest organ of your body. Burns damage this natural protection. To help prevent infection, it is very important to follow your caregiver's instructions in the care of your burn. Burns are classified as:  First degree. There is only redness of the skin (erythema). No scarring is expected.  Second degree. There is blistering of the skin. Scarring may occur with deeper burns.  Third degree. All layers of the skin are injured, and scarring is expected. HOME CARE INSTRUCTIONS   Wash your hands well before changing your bandage.  Change your bandage as often as directed by your caregiver.  Remove the old bandage. If the bandage sticks, you may soak it off with cool, clean water.  Cleanse the burn thoroughly but gently with mild soap and water.  Pat the area dry with a clean, dry cloth.  Apply a thin layer of antibacterial cream to the burn.  Apply a clean bandage as instructed by your caregiver.  Keep the bandage as clean and dry as possible.  Elevate the affected area for the first 24 hours, then as instructed by your caregiver.  Only take over-the-counter or prescription medicines for pain, discomfort, or fever as directed by your caregiver. SEEK IMMEDIATE MEDICAL CARE IF:   You develop excessive pain.  You develop redness, tenderness, swelling, or red streaks near the burn.  The burned area develops yellowish-white fluid (pus) or a bad smell.  You have a fever. MAKE SURE YOU:   Understand these instructions.  Will watch your condition.  Will get help right away if you are not doing well or get worse. Document Released: 01/11/2005 Document Revised: 04/05/2011 Document Reviewed: 06/03/2010 [employer] Patient Information 2015 [employer]. This information is not intended to replace advice given to you by your health care provider. Make sure you discuss any questions you have with your health care  provider.  

## 2014-07-29 NOTE — ED Notes (Signed)
MD at bedside. 
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
## 2014-08-05 ENCOUNTER — Telehealth: Payer: Self-pay | Admitting: Internal Medicine

## 2014-08-05 NOTE — Telephone Encounter (Signed)
Left message on machine for patient to return our call 

## 2014-08-05 NOTE — Telephone Encounter (Signed)
Would MD Shawna Orleans like to make follow-up appointment or would like to give home advice and if symptoms worsen to come in? Thanks.

## 2014-08-05 NOTE — Telephone Encounter (Signed)
I suggest she make office visit.  She may need to see another provide
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
## 2014-08-05 NOTE — Telephone Encounter (Signed)
Pt called back, is aware of an appt on 08/06/14 with Tommi Rumps NP regarding the matter.

## 2014-08-06 ENCOUNTER — Ambulatory Visit: Payer: Self-pay | Admitting: Adult Health

## 2014-09-04 ENCOUNTER — Ambulatory Visit (INDEPENDENT_AMBULATORY_CARE_PROVIDER_SITE_OTHER): Payer: Medicare Other | Admitting: Internal Medicine

## 2014-09-04 ENCOUNTER — Encounter: Payer: Self-pay | Admitting: Internal Medicine

## 2014-09-04 VITALS — BP 120/80 | HR 109 | Temp 98.6°F | Ht 65.5 in | Wt 167.4 lb

## 2014-09-04 DIAGNOSIS — M5442 Lumbago with sciatica, left side: Secondary | ICD-10-CM

## 2014-09-04 DIAGNOSIS — R079 Chest pain, unspecified: Secondary | ICD-10-CM | POA: Diagnosis not present

## 2014-09-04 DIAGNOSIS — I10 Essential (primary) hypertension: Secondary | ICD-10-CM | POA: Diagnosis not present

## 2014-09-04 DIAGNOSIS — Z720 Tobacco use: Secondary | ICD-10-CM | POA: Diagnosis not present

## 2014-09-04 DIAGNOSIS — F17200 Nicotine dependence, unspecified, uncomplicated: Secondary | ICD-10-CM
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
## 2014-09-04 NOTE — Progress Notes (Signed)
Pre visit review using our clinic review tool, if applicable. No additional management support is needed unless otherwise documented below in the visit note. 

## 2014-09-11 ENCOUNTER — Telehealth: Payer: Self-pay | Admitting: Internal Medicine

## 2014-09-11 NOTE — Telephone Encounter (Signed)
New Message  This message is to inform you that we have made 3 consecutive attempts to contact the patient since 08/26/2014. We have also mailed a letter to the patient to inform them to call in and schedule. Although we were unsuccessful in these attempts we wanted you to be aware of our efforts. Will remove the patient from our work queue at this time.    [place] [facility]

## 2014-09-12 NOTE — Telephone Encounter (Signed)
Kristina Ayala,  Can you try to contact patient ?

## 2014-09-13 NOTE — Telephone Encounter (Signed)
Attempted to call patient.  A female answered the phone saying," she's busy right now.  I will have her give you a call back".  She then hung up the phone.

## 2014-09-16 DIAGNOSIS — L602 Onychogryphosis: Secondary | ICD-10-CM | POA: Diagnosis not present

## 2014-09-16 DIAGNOSIS — L84 Corns and callosities: Secondary | ICD-10-CM | POA: Diagnosis not present

## 2014-09-18 ENCOUNTER — Encounter: Payer: Self-pay | Admitting: Family Medicine

## 2014-09-18 ENCOUNTER — Other Ambulatory Visit (INDEPENDENT_AMBULATORY_CARE_PROVIDER_SITE_OTHER): Payer: Medicare Other

## 2014-09-18 ENCOUNTER — Ambulatory Visit (INDEPENDENT_AMBULATORY_CARE_PROVIDER_SITE_OTHER): Payer: Medicare Other | Admitting: Family Medicine

## 2014-09-18 VITALS — BP 130/84 | HR 90 | Wt 171.0 lb

## 2014-09-18 DIAGNOSIS — S8002XA Contusion of left knee, initial encounter: Secondary | ICD-10-CM | POA: Diagnosis not present

## 2014-09-18 DIAGNOSIS — M25562 Pain in left knee: Secondary | ICD-10-CM

## 2014-09-18 DIAGNOSIS — M5416 Radiculopathy, lumbar region: Secondary | ICD-10-CM

## 2014-09-18 MED ORDER — GABAPENTIN 100 MG PO CAPS: 100.0000 mg | ORAL_CAPSULE | Freq: Every day | ORAL | Status: DC

## 2014-09-18 NOTE — Patient Instructions (Addendum)
Nice to meet you  Xray of knee downstairs today.  Try Pennsaid, topical anti-inflammatory, to your knee and back twice a day Rehab exercises 3x/week Can use Arnica gel on bruise on knee (ask at local pharmacy) Ice 20 minutes 2 times daily. Usually after activity and before bed. Tart cherry extract take that at night See me again in 2-3 weeks to make sure we are making progress

## 2014-09-18 NOTE — Progress Notes (Signed)
Pre visit review using our clinic review tool, if applicable. No additional management support is needed unless otherwise documented below in the visit note. 

## 2014-09-19 ENCOUNTER — Ambulatory Visit (INDEPENDENT_AMBULATORY_CARE_PROVIDER_SITE_OTHER)
Admission: RE | Admit: 2014-09-19 | Discharge: 2014-09-19 | Disposition: A | Discharge status: Home / Self Care | Payer: Medicare Other | Source: Ambulatory Visit | Attending: Family Medicine | Admitting: Family Medicine

## 2014-09-19 ENCOUNTER — Encounter: Payer: Self-pay | Admitting: Family Medicine

## 2014-09-19 ENCOUNTER — Other Ambulatory Visit: Payer: Self-pay | Admitting: *Deleted

## 2014-09-19 DIAGNOSIS — M5416 Radiculopathy, lumbar region: Secondary | ICD-10-CM | POA: Insufficient documentation

## 2014-09-19 DIAGNOSIS — M25562 Pain in left knee: Secondary | ICD-10-CM | POA: Diagnosis not present

## 2014-09-19 DIAGNOSIS — S8992XA Unspecified injury of left lower leg, initial encounter: Secondary | ICD-10-CM | POA: Diagnosis not present

## 2014-09-19 DIAGNOSIS — S8000XA Contusion of unspecified knee, initial encounter: Secondary | ICD-10-CM | POA: Insufficient documentation

## 2014-09-19 NOTE — Assessment & Plan Note (Signed)
No internal derangement of the knee noted today. We discussed icing regimen and home exercises. We discussed topical medications a can help with the bruising. We discussed this likely will take 2 weeks to fully heal. If no significant improvement in 2 weeks patient will come back and we will consider further imaging.

## 2014-09-19 NOTE — Progress Notes (Signed)
[facility] [address] [place], [place] [address] Phone: [phone] Subjective:    I'm seeing this patient by the request  of:  Drema Pry, DO   CC: Left knee pain and back pain  [id number] Kristina Ayala is a 69 y.o. female coming in with complaint of 2 different problems 1. Knee pain. This is a new problem. Patient fell yesterday. Landing on the lateral aspect of her left knee. States that she had swelling immediately. States that now it seems to be better but still having pain mostly only to touch. Denies any instability. Patient states though that it can hurt significantly. Did keep her up last night. Was unable to walk immediately but now is doing much better. Denies any radiation of pain denies any locking on her. 2. Back pain. Patient feels like it is more of her left hip internally her back. Denies any specific injury or trauma. States that it seems to be worse when she lays on that side. Patient states that it walking a long amount of time she can have some pain as well. Sometimes some radiation seems to be going down the leg that she calls more of a burning sensation. When call that she has weakness but sometimes feels like her leg is not completely stable. Been going on for quite some time. X-rays in 2015 patient's back does show degenerative disc disease that is moderate severe at L1-L2 and L4-L5 with sacroiliac joints that are corticated.     Past medical history, social, surgical and family history all reviewed in electronic medical record.   Review of Systems: No headache, visual changes, nausea, vomiting, diarrhea, constipation, dizziness, abdominal pain, skin rash, fevers, chills, night sweats, weight loss, swollen lymph nodes, body aches, joint swelling, muscle aches, chest pain, shortness of breath, mood changes.   Objective Blood pressure 130/84, pulse 90, weight 171 lb (77.565 kg), SpO2 96 %.  General: No apparent distress alert  and oriented x3 mood and affect normal, dressed appropriately.  HEENT: Pupils equal, extraocular movements intact  Respiratory: Patient's speak in full sentences and does not appear short of breath  Cardiovascular: No lower extremity edema, non tender, no erythema  Skin: Warm dry intact with no signs of infection or rash on extremities or on axial skeleton.  Abdomen: Soft nontender  Neuro: Cranial nerves II through XII are intact, neurovascularly intact in all extremities with 2+ DTRs and 2+ pulses.  Lymph: No lymphadenopathy of posterior or anterior cervical chain or axillae bilaterally.  Gait normal with good balance and coordination.  MSK:  Non tender with full range of motion and good stability and symmetric strength and tone of shoulders, elbows, wrist, hip, and ankles bilaterally.  Back Exam:  Inspection: Unremarkable  Motion: Flexion 30 deg, Extension 25 deg, Side Bending to 25 deg bilaterally,  Rotation to 45 deg bilaterally  SLR laying: Positive left XSLR laying: Negative  Palpable tenderness: Tenderness in the paraspinal musculature on the left side and lumbar spine FABER: negative. very tight Sensory change: Gross sensation intact to all lumbar and sacral dermatomes.  Reflexes: 2+ at both patellar tendons, 2+ at achilles tendons, Babinski's downgoing.  Strength at foot  Plantar-flexion: 5/5 Dorsi-flexion: 5/5 Eversion: 5/5 Inversion: 5/5  Leg strength  Quad: 5/5 Hamstring: 5/5 Hip flexor: 5/5 Hip abductors: 3/5  Gait unremarkable.   Knee: Left Normal to inspection with no erythema or effusion or obvious bony abnormalities. Mild tenderness noted over the patella itself ROM full in  flexion and extension and lower leg rotation. Ligaments with solid consistent endpoints including ACL, PCL, LCL, MCL. Negative Mcmurray's, Apley's, and Thessalonian tests. Non painful patellar compression. Patellar glide without crepitus. Patellar and quadriceps tendons unremarkable. Hamstring  and quadriceps strength is normal.    MSK US performed of: Left knee This study was ordered, performed, and interpreted by Charlann Boxer D.O.  Knee: All structures visualized. Anteromedial, anterolateral, posteromedial, and posterolateral menisci unremarkable without tearing, fraying, effusion, or displacement. Patellar Tendon unremarkable on long and transverse views without effusion. No abnormality of prepatellar bursa. LCL and MCL unremarkable on long and transverse views. No abnormality of origin of medial or lateral head of the gastrocnemius.  IMPRESSION:  NORMAL ULTRASONOGRAPHIC EXAMINATION OF THE KNEE.  Procedure note [id number]; 15 minutes spent for Therapeutic exercises as stated in above notes.  This included exercises focusing on stretching, strengthening, with significant focus on eccentric aspects.  Low back exercises that included:  Pelvic tilt/bracing instruction to focus on control of the pelvic girdle and lower abdominal muscles  Glute strengthening exercises, focusing on proper firing of the glutes without engaging the low back muscles Proper stretching techniques for maximum relief for the hamstrings, hip flexors, low back and some rotation where tolerated Proper technique shown and discussed handout in great detail with ATC.  All questions were discussed and answered.     Impression and Recommendations:     This case required medical decision making of moderate complexity.

## 2014-09-19 NOTE — Assessment & Plan Note (Signed)
I do believe that the patient's pain that seems to be more going down the leg seems to be secondary to the lumbar radiculopathy with a positive straight leg test today. Patient does have x-rays 1 year ago that does show patient had degenerative disc disease at multiple levels especially L5-S1. I do think that this is likely the area where the nerve is effect. We discussed different treatment options and patient has elected try conservative therapy. Patient given a trial of a stronger topical anti-inflammatory's, icing protocol, and we will start gabapentin at night to help with some of the nerve pain. We discussed further imaging may be warranted. Patient is more concerned that it is her hip but I do not have any findings on exam today to support this. Patient was given the option of formal physical therapy which she declined at this time. Patient will come back and see me again in 4 weeks to make sure she is responding.

## 2014-10-02 ENCOUNTER — Ambulatory Visit (INDEPENDENT_AMBULATORY_CARE_PROVIDER_SITE_OTHER): Payer: Medicare Other | Admitting: Family Medicine

## 2014-10-02 ENCOUNTER — Encounter: Payer: Self-pay | Admitting: Family Medicine

## 2014-10-02 ENCOUNTER — Ambulatory Visit (INDEPENDENT_AMBULATORY_CARE_PROVIDER_SITE_OTHER)
Admission: RE | Admit: 2014-10-02 | Discharge: 2014-10-02 | Disposition: A | Discharge status: Home / Self Care | Payer: Medicare Other | Source: Ambulatory Visit | Attending: Family Medicine | Admitting: Family Medicine

## 2014-10-02 VITALS — BP 138/80 | HR 110 | Ht 65.5 in | Wt 168.0 lb

## 2014-10-02 DIAGNOSIS — M545 Low back pain, unspecified: Secondary | ICD-10-CM

## 2014-10-02 DIAGNOSIS — M5416 Radiculopathy, lumbar region: Secondary | ICD-10-CM

## 2014-10-02 DIAGNOSIS — G8929 Other chronic pain: Secondary | ICD-10-CM

## 2014-10-02 NOTE — Assessment & Plan Note (Signed)
Patient is having less radicular symptoms and previous exam. This is encouraging. Patient continues to have significant amount back pain. X-rays ordered today to further evaluate. Patient may need advanced imaging if no significant improvement. Patient will try some over-the-counter natural supplementations as well as an icing protocol. Patient has been referred to formal physical therapy and patient will follow-up again in 3-4 weeks for further evaluation and treatment.  Spent  25 minutes with patient face-to-face and had greater than 50% of counseling including as described above in assessment and plan.

## 2014-10-02 NOTE — Progress Notes (Signed)
  [name] [address] [place], [place] [address] Phone: [phone] Subjective:    I'm seeing this patient by the request  of:  Drema Pry, DO   CC: Left knee pain and back pain  [id number] Kristina Ayala is a 69 y.o. female coming in with complaint of 2 different problems 1. Knee pain.  Near resolved pain.   2. Back pain. Continues to have significant pain. Patient does have x-rays showing degenerative disc disease at L1-L2 and L4-L5 as well as significant corticated sacroiliac joint. Patient continues to have significant amount of pain and does have some radiation going down the leg. Seems to be more constant than previously. Affecting her daily activities. Patient is taking the vitamins with minimal improvement. Patient has noticed some heat has been helpful. Takes a gabapentin at night occasionally. Not taking any other pain medications at this time.     Past medical history, social, surgical and family history all reviewed in electronic medical record.   Review of Systems: No headache, visual changes, nausea, vomiting, diarrhea, constipation, dizziness, abdominal pain, skin rash, fevers, chills, night sweats, weight loss, swollen lymph nodes, body aches, joint swelling, muscle aches, chest pain, shortness of breath, mood changes.   Objective There were no vitals taken for this visit.  General: No apparent distress alert and oriented x3 mood and affect normal, dressed appropriately.  HEENT: Pupils equal, extraocular movements intact  Respiratory: Patient's speak in full sentences and does not appear short of breath  Cardiovascular: No lower extremity edema, non tender, no erythema  Skin: Warm dry intact with no signs of infection or rash on extremities or on axial skeleton.  Abdomen: Soft nontender  Neuro: Cranial nerves II through XII are intact, neurovascularly intact in all extremities with 2+ DTRs and 2+ pulses.  Lymph: No  lymphadenopathy of posterior or anterior cervical chain or axillae bilaterally.  Gait normal with good balance and coordination.  MSK:  Non tender with full range of motion and good stability and symmetric strength and tone of shoulders, elbows, wrist, hip, and ankles bilaterally.  Back Exam:  Inspection: mild straightening of the lumbar spine Motion: Flexion 30 deg, Extension 25 deg, Side Bending to 25 deg bilaterally,  Rotation to 45 deg bilaterally  SLR laying: Positive leftstill present and possibly worse XSLR laying: Negative  Palpable tenderness: Tenderness in the paraspinal musculature on the left side and lumbar spineworse than right. Tenderness over the sacroiliac joints bilaterally FABER: very tight bilaterally Sensory change: Gross sensation intact to all lumbar and sacral dermatomes.  Reflexes: 2+ at both patellar tendons, 2+ at achilles tendons, Babinski's downgoing.  Strength at foot  Plantar-flexion: 5/5 Dorsi-flexion: 5/5 Eversion: 5/5 Inversion: 5/5  Leg strength  Quad: 5/5 Hamstring: 5/5 Hip flexor: 5/5 Hip abductors: 3+/5 minimal improvement Gait unremarkable.     Impression and Recommendations:     This case required medical decision making of moderate complexity.

## 2014-10-02 NOTE — Progress Notes (Signed)
Pre visit review using our clinic review tool, if applicable. No additional management support is needed unless otherwise documented below in the visit note. 

## 2014-10-02 NOTE — Patient Instructions (Signed)
Good to see you Continue the vitamins Physical therapy for back pain and they will call you Duexis 3 times daily for 6 days this will help with inflammation pennsaid pinkie amount topically 2 times daily as needed for inflammation Heat before activity and ice before bed.  Tylenol 500mg  3 times daily for arthritis Gabapentin at 200mg  at night  Consider TENS unit online.  Xrays downstairs today Best thing you can do for back pain is stay active.  Wear good shoes as much as possible.  See me again in 2-3 weeks.

## 2014-10-16 ENCOUNTER — Ambulatory Visit: Payer: Medicare Other | Attending: Family Medicine | Admitting: Physical Therapy

## 2014-10-16 ENCOUNTER — Encounter: Payer: Self-pay | Admitting: Physical Therapy

## 2014-10-16 ENCOUNTER — Ambulatory Visit: Payer: Medicare Other | Admitting: Cardiovascular Disease

## 2014-10-16 DIAGNOSIS — R269 Unspecified abnormalities of gait and mobility: Secondary | ICD-10-CM | POA: Diagnosis not present

## 2014-10-16 DIAGNOSIS — M5442 Lumbago with sciatica, left side: Secondary | ICD-10-CM | POA: Diagnosis not present

## 2014-10-16 DIAGNOSIS — W19XXXD Unspecified fall, subsequent encounter: Secondary | ICD-10-CM | POA: Insufficient documentation

## 2014-10-16 DIAGNOSIS — R29898 Other symptoms and signs involving the musculoskeletal system: Secondary | ICD-10-CM

## 2014-10-16 NOTE — Therapy (Signed)
[address] [place], [place], [address] Phone: [phone]   Fax:  [phone]  Physical Therapy Evaluation  Patient Details  Name: Kristina Ayala MRN: [id number] Date of Birth: Nov 06, 1945 Referring Provider:  Lyndal Pulley, DO  Encounter Date: 10/16/2014      PT End of Session - 10/16/14 0912    Visit Number 1   Date for PT Re-Evaluation 12/16/14   PT Start Time 0840   PT Stop Time 0930   PT Time Calculation (min) 50 min   Activity Tolerance Patient tolerated treatment well   Behavior During Therapy [facility] for tasks assessed/performed      Past Medical History  Diagnosis Date  . Hypertension   . Arthritis   . Bursitis     left elbow  . Infectious colitis   . Clostridium difficile infection   . HLD (hyperlipidemia)   . Frequent falls     Past Surgical History  Procedure Laterality Date  . Tonsillectomy    . Orif proximal tibial plateau fracture Left 02/19/2012  . Polypectomy    . Colonoscopy      There were no vitals filed for this visit.  Visit Diagnosis:  Abnormality of gait - Plan: PT plan of care cert/re-cert  Left leg weakness - Plan: PT plan of care cert/re-cert  Left-sided low back pain with left-sided sciatica - Plan: PT plan of care cert/re-cert  Falls, subsequent encounter - Plan: PT plan of care cert/re-cert      Subjective Assessment - 10/16/14 0843    Subjective Reports a fall in 2014.  Broke the left lower leg.  Reports that she has had some balance issues and then another fall about 2 months ago.  She reports that since the fall that happened two months ago she has had left low back pain and a sciatica type pain.   Limitations Sitting;Standing;Walking;House hold activities   How long can you stand comfortably? 30 minutes   Diagnostic tests x-rays   Patient Stated Goals be able to stand longer, have less pain and have better balance.   Currently in Pain? Yes    Pain Score 2    Pain Location Back   Pain Orientation Left;Lower   Pain Descriptors / Indicators Aching;Tightness;Spasm   Pain Type Chronic pain   Pain Onset More than a month ago   Pain Frequency Intermittent   Aggravating Factors  reports that with standing and activity her pain level can be up to 9/10   Pain Relieving Factors ice, helps, rest   Effect of Pain on Daily Activities fear of falling, can't stand long            [facility] PT Assessment - 10/16/14 0001    Assessment   Medical Diagnosis low back pain, balance deficits   Onset Date/Surgical Date 08/15/14   Prior Therapy no   Precautions   Precautions None   Balance Screen   Has the patient fallen in the past 6 months Yes   How many times? 1   Has the patient had a decrease in activity level because of a fear of falling?  Yes   Is the patient reluctant to leave their home because of a fear of falling?  No   Home Environment   Living Environment Private residence   Additional Comments stairs but has a lift, does her own housework   Prior Function   Level of [place] Retired  Leisure does not exercise   Posture/Postural Control   Posture Comments fwd head, rounded shoulders   AROM   Overall AROM Comments lumbar ROM is decreased 25%   Strength   Overall Strength Comments Right LE 4/5, left LE 4-/5   Flexibility   Soft Tissue Assessment /Muscle Length --  some tightness int he HS and calves   Palpation   Palpation comment tender with spasms in the lumbar parapsinals and into the buttock and ITB   Ambulation/Gait   Gait Comments no assistive device, slow, unsteady gait especially with turns, tends to lean to the left and have an antalgic gait on the left   Standardized Balance Assessment   Standardized Balance Assessment Timed Up and Go Test;Berg Balance Test   Berg Balance Test   Sit to Stand Able to stand without using hands and stabilize independently   Standing Unsupported Able to  stand safely 2 minutes   Sitting with Back Unsupported but Feet Supported on Floor or Stool Able to sit safely and securely 2 minutes   Stand to Sit Sits safely with minimal use of hands   Transfers Able to transfer safely, minor use of hands   Standing Unsupported with Eyes Closed Able to stand 10 seconds with supervision   Standing Ubsupported with Feet Together Able to place feet together independently and stand for 1 minute with supervision   From Standing, Reach Forward with Outstretched Arm Can reach confidently >25 cm (10")   From Standing Position, Pick up Object from Floor Unable to pick up shoe, but reaches 2-5 cm (1-2") from shoe and balances independently   From Standing Position, Turn to Look Behind Over each Shoulder Looks behind one side only/other side shows less weight shift   Turn 360 Degrees Able to turn 360 degrees safely one side only in 4 seconds or less   Standing Unsupported, Alternately Place Feet on Step/Stool Able to stand independently and safely and complete 8 steps in 20 seconds   Standing Unsupported, One Foot in [employer] balance while stepping or standing   Standing on One Leg Tries to lift leg/unable to hold 3 seconds but remains standing independently   Total Score 43   Berg comment: great difficulty with standing on any dynamic surface   Timed Up and Go Test   Normal TUG (seconds) 16                   [facility] Adult PT Treatment/Exercise - 10/16/14 0001    Knee/Hip Exercises: Aerobic   Nustep L4 x 5 minutes   Knee/Hip Exercises: Machines for Strengthening   Cybex Knee Extension 5# 2x10   Cybex Knee Flexion 20#2x10                PT Education - 10/16/14 0912    Education provided Yes   Education Details Wms flexion exercises, balance exercises   Person(s) Educated Patient   Methods Explanation;Demonstration;Handout   Comprehension Verbalized understanding;Need further instruction          PT Short Term Goals - 10/16/14 0918     PT SHORT TERM GOAL #1   Title independen with initial HEP   Time 2   Period Weeks   Status New           PT Long Term Goals - 10/16/14 0919    PT LONG TERM GOAL #1   Title Pt will verbalize understanding of falls prevention strategies to reduce falls risk.    Time 8  Period Weeks   Status New   PT LONG TERM GOAL #2   Title Pt will improve BERG balance score to >/=48/56 to reduce falls risk.    Time 8   Period Weeks   Status New   PT LONG TERM GOAL #3   Title Patient will have decreased TUG test time to 12 seconds   Time 8   Period Weeks   Status New   PT LONG TERM GOAL #4   Title no falls over a 4 week period   Time 8   Period Weeks   Status New   PT LONG TERM GOAL #5   Title will report low back pain decreased 25%   Time 8   Period Weeks   Status New               Plan - Oct 24, 2014 0913    Clinical Impression Statement Patient with low back pain with left sciatica, she also has had some significant balance issues over the past few years.  She reports 1 fall in the past 6 months but reports about 12 falls over the psat 2 years.  Difficulty with high level balance, also some wekness in the left lower  leg   Pt will benefit from skilled therapeutic intervention in order to improve on the following deficits Abnormal gait;Decreased balance;Decreased mobility;Decreased range of motion;Decreased strength;Difficulty walking;Increased muscle spasms;Pain;Improper body mechanics   Rehab Potential Good   PT Frequency 2x / week   PT Duration 8 weeks   PT Treatment/Interventions Moist Heat;Electrical Stimulation;Traction;Ultrasound;Cryotherapy;Gait training;Functional mobility training;Therapeutic activities;Therapeutic exercise;Manual techniques;Patient/family education;Neuromuscular re-education;Balance training   PT Next Visit Plan can try any exercises or modality for her back pain, work on strength and balance as well   Consulted and Agree with Plan of Care Patient           G-Codes - 10/24/14 0924    Functional Assessment Tool Used FOTO   Functional Limitation Mobility: Walking and moving around   Mobility: Walking and Moving Around Current Status [phone]) At least 60 percent but less than 80 percent impaired, limited or restricted   Mobility: Walking and Moving Around Goal Status [phone]) At least 40 percent but less than 60 percent impaired, limited or restricted       Problem List Patient Active Problem List   Diagnosis Date Noted  . Lumbar radiculopathy 09/19/2014  . Knee contusion 09/19/2014  . Preventative health care 01/04/2014  . Low back pain 01/04/2014  . Bilateral knee pain 01/04/2014  . Diverticulosis of colon without hemorrhage 07/26/2013  . Diarrhea 07/26/2013  . Trigger thumb of left hand 03/06/2013  . Dyslipidemia 02/15/2013  . Essential hypertension, benign 02/15/2013  . Hyperlipidemia 09/28/2012  . COPD (chronic obstructive pulmonary disease) 05/22/2012  . Frequent falls 05/22/2012  . Tobacco use disorder 05/22/2012  . Osteoporosis, unspecified 05/22/2012  . Chest pain 11/12/2011  . Hypertension 08/04/2011    Sumner Boast., PT 10/24/2014, 9:50 AM  [address] [place], [place], [address] Phone: [phone]   Fax:  [phone]

## 2014-10-23 ENCOUNTER — Ambulatory Visit: Payer: Medicare Other | Admitting: Physical Therapy

## 2014-10-23 ENCOUNTER — Ambulatory Visit: Payer: Medicare Other | Admitting: Family Medicine

## 2014-10-25 ENCOUNTER — Ambulatory Visit: Payer: Medicare Other | Admitting: Physical Therapy

## 2014-10-25 ENCOUNTER — Telehealth: Payer: Self-pay

## 2014-10-25 NOTE — Telephone Encounter (Signed)
10/25/14 patient cancelled PT appt due to family emergency

## 2014-11-05 ENCOUNTER — Ambulatory Visit (INDEPENDENT_AMBULATORY_CARE_PROVIDER_SITE_OTHER): Payer: Medicare Other | Admitting: Adult Health

## 2014-11-05 ENCOUNTER — Encounter: Payer: Self-pay | Admitting: Adult Health

## 2014-11-05 DIAGNOSIS — F411 Generalized anxiety disorder: Secondary | ICD-10-CM

## 2014-11-05 DIAGNOSIS — M545 Low back pain: Secondary | ICD-10-CM | POA: Diagnosis not present

## 2014-11-05 MED ORDER — ALPRAZOLAM 0.5 MG PO TABS
0.5000 mg | ORAL_TABLET | Freq: Every evening | ORAL | Status: DC | PRN
Start: 2014-11-05 — End: 2015-01-28

## 2014-11-05 NOTE — Progress Notes (Signed)
Pre visit review using our clinic review tool, if applicable. No additional management support is needed unless otherwise documented below in the visit note. 

## 2014-11-05 NOTE — Patient Instructions (Signed)
It was great seeing you again! I am so sorry you are having to go through all of this.   Please follow up with Dr. Tamala Julian.   Someone will call you to schedule Physical Therapy   Take the Xanax as needed for anxiety. Also, get outside and walk around the neighborhood or go to park. Practice deep breathing exercises.

## 2014-11-05 NOTE — Progress Notes (Signed)
Subjective:    Patient ID: Kristina Ayala, female    DOB: [birth year], 69 y.o.   MRN: [id number]  HPI  69 year old female who presents to the office today for two month follow up regarding impaired gait. She is a patient of Dr. Shawna Orleans.Dr. Shawna Orleans last saw her in August. More recently she was seen by Dr. Tamala Julian of sports medicine for left knee pain and back pain. X ray showed degenerative disc disease at L1-l2 and L4-L5 as well as significant corticated sacroiliac joint. Dr. Tamala Julian referred for physical therapy and follow up in 3-4 weeks.   Since that that she has had a lot going on in her life. Her daughter was in the hospital for delivery of her child. A day after giving birth she went into cardiac arrest and was placed on life support. She was taken off life support yesterday and is being sent home today. Ms. Campoy will have her daughter stay with her while she recuperates. Ms. Probert is endorsing having " a lot of anxiety over her hospital stay and living with me. I know I cannot take care of her by myself."  The baby is being taken care of by their god mother.   She would like to return to PT to help with her back and leg pain     Review of Systems  Constitutional: Negative.   Respiratory: Negative.   Cardiovascular: Negative.   Musculoskeletal: Positive for myalgias, back pain, arthralgias and gait problem. Negative for joint swelling, neck pain and neck stiffness.  Psychiatric/Behavioral: Positive for sleep disturbance. The patient is nervous/anxious.   All other systems reviewed and are negative.  Past Medical History  Diagnosis Date  . Hypertension   . Arthritis   . Bursitis     left elbow  . Infectious colitis   . Clostridium difficile infection   . HLD (hyperlipidemia)   . Frequent falls     Social History   Social History  . Marital Status: Divorced    Spouse Name: N/A  . Number of Children: 2  . Years of Education: N/A   Occupational History  . RETIRED     Social History Main Topics  . Smoking status: Former Smoker -- 0.25 packs/day for 50 years    Types: Cigarettes    Quit date: 08/28/2014  . Smokeless tobacco: Never Used     Comment: 1 pack every 3 days  . Alcohol Use: No  . Drug Use: No  . Sexual Activity: Not on file   Other Topics Concern  . Not on file   Social History Narrative   Grandson (31 y/o) lives with patient.     Son lives in [place]   Daughter lives in [place]   She is originally from [place], [place]   Divorced             Past Surgical History  Procedure Laterality Date  . Tonsillectomy    . Orif proximal tibial plateau fracture Left 02/19/2012  . Polypectomy    . Colonoscopy      Family History  Problem Relation Age of Onset  . Diverticulosis Mother   . Ovarian cancer Mother   . Pancreatic cancer Father   . Lung cancer Father   . Colon polyps Sister   . Colon cancer Neg Hx     Allergies  Allergen Reactions  . Doxycycline Diarrhea  . Minocycline Diarrhea  . Wellbutrin [Bupropion] Swelling    Per pt her tongue was swollen  and sore/symptoms stopped once the medication was discontinued.   [name] Penicillins Rash    Current Outpatient Prescriptions on File Prior to Visit  Medication Sig Dispense Refill  . acetaminophen (TYLENOL) 500 MG tablet Take 1,000 mg by mouth every 6 (six) hours as needed (For burn pain.).    [name] amLODipine (NORVASC) 5 MG tablet Take 1 tablet (5 mg total) by mouth daily. 90 tablet 1  . atorvastatin (LIPITOR) 40 MG tablet Take 1 tablet (40 mg total) by mouth every morning. 90 tablet 1  . calcium-vitamin D (OSCAL WITH D) 500-200 MG-UNIT per tablet Take 1 tablet by mouth daily with breakfast.     . cholecalciferol (VITAMIN D) 1000 UNITS tablet Take 1,000 Units by mouth daily.    . Cyanocobalamin (VITAMIN B-12) 2500 MCG SUBL Place 1 tablet under the tongue daily.    . diclofenac sodium (VOLTAREN) 1 % GEL Apply 2 g topically 4 (four) times daily as needed (For pain.).     [name]  escitalopram (LEXAPRO) 10 MG tablet Take 1 tablet (10 mg total) by mouth daily. 30 tablet 3  . ferrous sulfate 325 (65 FE) MG tablet Take 1 tablet (325 mg total) by mouth daily with breakfast. (Patient taking differently: Take 325 mg by mouth 3 (three) times a week. ) 30 tablet 3  . gabapentin (NEURONTIN) 100 MG capsule Take 1-2 capsules (100-200 mg total) by mouth at bedtime. 60 capsule 3  . hydrochlorothiazide (HYDRODIURIL) 25 MG tablet Take 1 tablet (25 mg total) by mouth every morning. 90 tablet 1  . lisinopril (PRINIVIL,ZESTRIL) 40 MG tablet Take 1 tablet (40 mg total) by mouth every morning. 90 tablet 1  . potassium chloride (K-DUR) 10 MEQ tablet Take 1 tablet (10 mEq total) by mouth daily. 90 tablet 1   No current facility-administered medications on file prior to visit.    BP 114/74 mmHg  Temp(Src) 99 F (37.2 C) (Oral)  Ht 5' 5.5" (1.664 m)  Wt 170 lb 8 oz (77.338 kg)  BMI 27.93 kg/m2       Objective:   Physical Exam  Constitutional: She is oriented to person, place, and time. She appears well-developed and well-nourished. No distress.  Cardiovascular: Normal rate, regular rhythm, normal heart sounds and intact distal pulses.  Exam reveals no gallop and no friction rub.   No murmur heard. Pulmonary/Chest: Effort normal and breath sounds normal. No respiratory distress. She has no wheezes. She has no rales. She exhibits no tenderness.  Musculoskeletal: Normal range of motion. She exhibits no edema or tenderness.  Neurological: She is alert and oriented to person, place, and time. She has normal reflexes.  Skin: Skin is warm and dry. No rash noted. She is not diaphoretic. No erythema. No pallor.  Psychiatric: She has a normal mood and affect. Her behavior is normal. Judgment and thought content normal.  Nursing note and vitals reviewed.     Assessment & Plan:  1. Bilateral low back pain, with sciatica presence unspecified - Will refer back to PT.  - Follow up with Dr.  Tamala Julian - Ambulatory referral to Physical Therapy - Continue with stretching exercises and icing 2. Anxiety state - ALPRAZolam (XANAX) 0.5 MG tablet; Take 1 tablet (0.5 mg total) by mouth at bedtime as needed for anxiety.  Dispense: 30 tablet; Refill: 0 - Patient informed that this was a short term treatment.  - Consider psychiatry referral  - Follow up as needed

## 2014-11-06 ENCOUNTER — Ambulatory Visit: Payer: Medicare Other | Admitting: Internal Medicine

## 2014-11-18 ENCOUNTER — Encounter: Payer: Self-pay | Admitting: Cardiovascular Disease

## 2014-11-18 ENCOUNTER — Ambulatory Visit (INDEPENDENT_AMBULATORY_CARE_PROVIDER_SITE_OTHER): Payer: Medicare Other | Admitting: Cardiovascular Disease

## 2014-11-18 VITALS — BP 120/68 | HR 98 | Ht 65.0 in | Wt 168.6 lb

## 2014-11-18 DIAGNOSIS — R072 Precordial pain: Secondary | ICD-10-CM

## 2014-11-18 NOTE — Patient Instructions (Signed)
Medication Instructions:  Your physician recommends that you continue on your current medications as directed. Please refer to the Current Medication list given to you today.   Labwork: none  Testing/Procedures: Your physician has requested that you have a lexiscan myoview. For further information please visit [email or web address]. Please follow instruction sheet, as given.    Follow-Up: Your physician recommends that you schedule a follow-up appointment in: about 8 weeks. Scheduled for December 7,2016 at 11:00    Any Other Special Instructions Will Be Listed Below (If Applicable).     If you need a refill on your cardiac medications before your next appointment, please call your pharmacy.

## 2014-11-18 NOTE — Progress Notes (Signed)
Chief Complaint  Patient presents with  . New Evaluation    chest pain      History of Present Illness: 69 yo female with history of HTN, HLD and tobacco abuse who is here today for evaluation of chest pain. She has no known cardiac disease. She was seen by Dr. Percival Spanish in October 2013 for chest pain and stress test at that time showed no ischemia. She tells me today that she has had exertional chest pain for 2 months. This is described as a pressure that lasts for two minutes. Associated with SOB. Her balance is also off. Her daughter  Primary Care Physician: Drema Pry, DO   Last Lipid Profile:Lipid Panel     Component Value Date/Time   CHOL 139 10/02/2013 1011   TRIG 187* 10/02/2013 1011   HDL 44 10/02/2013 1011   CHOLHDL 3.2 10/02/2013 1011   VLDL 37 10/02/2013 1011   LDLCALC 58 10/02/2013 1011     Past Medical History  Diagnosis Date  . Hypertension   . Arthritis   . Bursitis     left elbow  . Infectious colitis   . Clostridium difficile infection   . HLD (hyperlipidemia)   . Frequent falls     Past Surgical History  Procedure Laterality Date  . Tonsillectomy    . Orif proximal tibial plateau fracture Left 02/19/2012  . Polypectomy    . Colonoscopy      Current Outpatient Prescriptions  Medication Sig Dispense Refill  . acetaminophen (TYLENOL) 500 MG tablet Take 1,000 mg by mouth every 6 (six) hours as needed (For burn pain.).    [name] ALPRAZolam (XANAX) 0.5 MG tablet Take 1 tablet (0.5 mg total) by mouth at bedtime as needed for anxiety. 30 tablet 0  . amLODipine (NORVASC) 5 MG tablet Take 1 tablet (5 mg total) by mouth daily. 90 tablet 1  . atorvastatin (LIPITOR) 40 MG tablet Take 1 tablet (40 mg total) by mouth every morning. 90 tablet 1  . calcium-vitamin D (OSCAL WITH D) 500-200 MG-UNIT per tablet Take 1 tablet by mouth daily with breakfast.     . cholecalciferol (VITAMIN D) 1000 UNITS tablet Take 1,000 Units by mouth daily.    . Cyanocobalamin (VITAMIN  B-12) 2500 MCG SUBL Place 1 tablet under the tongue daily.    . ferrous sulfate 325 (65 FE) MG tablet Take 1 tablet (325 mg total) by mouth daily with breakfast. (Patient taking differently: Take 325 mg by mouth 3 (three) times a week. ) 30 tablet 3  . hydrochlorothiazide (HYDRODIURIL) 25 MG tablet Take 1 tablet (25 mg total) by mouth every morning. 90 tablet 1  . lisinopril (PRINIVIL,ZESTRIL) 40 MG tablet Take 1 tablet (40 mg total) by mouth every morning. 90 tablet 1  . Magnesium 100 MG CAPS Take 100 mg by mouth daily.    . potassium chloride (K-DUR) 10 MEQ tablet Take 1 tablet (10 mEq total) by mouth daily. 90 tablet 1  . TURMERIC PO Take 1 tablet by mouth daily.     No current facility-administered medications for this visit.    Allergies  Allergen Reactions  . Doxycycline Diarrhea  . Minocycline Diarrhea  . Wellbutrin [Bupropion] Swelling    Per pt her tongue was swollen and sore/symptoms stopped once the medication was discontinued.   [name] Penicillins Rash    Social History   Social History  . Marital Status: Divorced    Spouse Name: N/A  . Number of Children: 2  .  Years of Education: N/A   Occupational History  . RETIRED    Social History Main Topics  . Smoking status: Former Smoker -- 0.25 packs/day for 50 years    Types: Cigarettes    Quit date: 08/28/2014  . Smokeless tobacco: Never Used     Comment: 1 pack every 3 days  . Alcohol Use: No  . Drug Use: No  . Sexual Activity: Not on file   Other Topics Concern  . Not on file   Social History Narrative   Grandson (55 y/o) lives with patient.     Son lives in [place]   Daughter lives in [place]   She is originally from [place], [place]   Divorced             Family History  Problem Relation Age of Onset  . Diverticulosis Mother   . Ovarian cancer Mother   . Pancreatic cancer Father   . Lung cancer Father   . Colon polyps Sister   . Colon cancer Neg Hx     Review of Systems:  As stated in the HPI and  otherwise negative.   BP 120/68 mmHg  Pulse 98  Ht 5\' 5"  (1.651 m)  Wt 168 lb 9.6 oz (76.476 kg)  BMI 28.06 kg/m2  SpO2 98%  Physical Examination: General: Well developed, well nourished, NAD HEENT: OP clear, mucus membranes moist SKIN: warm, dry. No rashes. Neuro: No focal deficits Musculoskeletal: Muscle strength 5/5 all ext Psychiatric: Mood and affect normal Neck: No JVD, no carotid bruits, no thyromegaly, no lymphadenopathy. Lungs:Clear bilaterally, no wheezes, rhonci, crackles Cardiovascular: Regular rate and rhythm. No murmurs, gallops or rubs. Abdomen:Soft. Bowel sounds present. Non-tender.  Extremities: No lower extremity edema. Pulses are 2 + in the bilateral DP/PT.  EKG:  EKG is ordered today. The ekg ordered today demonstrates NSR, rate 98 bpm. LAE. Poor R wave progression, unchanged.   Recent Labs: 05/06/2014: BUN 15; Creatinine, Ser 0.89; Potassium 4.0; Sodium 140   Lipid Panel    Component Value Date/Time   CHOL 139 10/02/2013 1011   TRIG 187* 10/02/2013 1011   HDL 44 10/02/2013 1011   CHOLHDL 3.2 10/02/2013 1011   VLDL 37 10/02/2013 1011   LDLCALC 58 10/02/2013 1011   LDLDIRECT 213.1 05/22/2012 1536     Wt Readings from Last 3 Encounters:  11/18/14 168 lb 9.6 oz (76.476 kg)  11/05/14 170 lb 8 oz (77.338 kg)  10/02/14 168 lb (76.204 kg)     Other studies Reviewed: Additional studies/ records that were reviewed today include: . Review of the above records demonstrates:    Assessment and Plan:   1. Chest pain: Her pain is exertional. Cannot exclude angina. Risk factors for CAD include HTN, HLD, tobacco abuse and advanced age. Will plan Lexiscan stress myoview to exclude ischemia   2. Tobacco abuse: Smoking cessation encouraged.   Current medicines are reviewed at length with the patient today.  The patient does not have concerns regarding medicines.  The following changes have been made:  no change  Labs/ tests ordered today include:   Orders  Placed This Encounter  Procedures  . Myocardial Perfusion Imaging  . EKG 12-Lead     Disposition:   FU with me in 6 weeks   Signed, Lauree Chandler, MD 11/18/2014 2:57 PM    [facility] [place], [place], [place]  [address] Phone: [phone]; Fax: [phone]

## 2014-11-27 ENCOUNTER — Encounter: Payer: Self-pay | Admitting: Adult Health

## 2014-11-27 ENCOUNTER — Ambulatory Visit (INDEPENDENT_AMBULATORY_CARE_PROVIDER_SITE_OTHER): Payer: Medicare Other | Admitting: Adult Health

## 2014-11-27 VITALS — BP 130/86 | HR 108 | Temp 98.8°F | Ht 65.0 in | Wt 167.7 lb

## 2014-11-27 DIAGNOSIS — J209 Acute bronchitis, unspecified: Secondary | ICD-10-CM

## 2014-11-27 DIAGNOSIS — Z23 Encounter for immunization: Secondary | ICD-10-CM

## 2014-11-27 MED ORDER — HYDROCODONE-HOMATROPINE 5-1.5 MG/5ML PO SYRP: 5.0000 mL | ORAL_SOLUTION | Freq: Three times a day (TID) | ORAL | Status: DC | PRN

## 2014-11-27 MED ORDER — METHYLPREDNISOLONE 4 MG PO TBPK: ORAL_TABLET | ORAL | Status: DC

## 2014-11-27 NOTE — Patient Instructions (Addendum)
It was great seeing you again!  Your exam is consistent with bronchitis.   Please use the cough syrup at night as it will make you sleepy  Take the prednisone as directed on the package.   Use Mucinex to help with the cough during the day  Follow up if no improvement in 2-3 days.    Acute Bronchitis Bronchitis is inflammation of the airways that extend from the windpipe into the lungs (bronchi). The inflammation often causes mucus to develop. This leads to a cough, which is the most common symptom of bronchitis.  In acute bronchitis, the condition usually develops suddenly and goes away over time, usually in a couple weeks. Smoking, allergies, and asthma can make bronchitis worse. Repeated episodes of bronchitis may cause further lung problems.  CAUSES Acute bronchitis is most often caused by the same virus that causes a cold. The virus can spread from person to person (contagious) through coughing, sneezing, and touching contaminated objects. SIGNS AND SYMPTOMS   Cough.   Fever.   Coughing up mucus.   Body aches.   Chest congestion.   Chills.   Shortness of breath.   Sore throat.  DIAGNOSIS  Acute bronchitis is usually diagnosed through a physical exam. Your health care provider will also ask you questions about your medical history. Tests, such as chest X-rays, are sometimes done to rule out other conditions.  TREATMENT  Acute bronchitis usually goes away in a couple weeks. Oftentimes, no medical treatment is necessary. Medicines are sometimes given for relief of fever or cough. Antibiotic medicines are usually not needed but may be prescribed in certain situations. In some cases, an inhaler may be recommended to help reduce shortness of breath and control the cough. A cool mist vaporizer may also be used to help thin bronchial secretions and make it easier to clear the chest.  HOME CARE INSTRUCTIONS  Get plenty of rest.   Drink enough fluids to keep your urine  clear or pale yellow (unless you have a medical condition that requires fluid restriction). Increasing fluids may help thin your respiratory secretions (sputum) and reduce chest congestion, and it will prevent dehydration.   Take medicines only as directed by your health care provider.  If you were prescribed an antibiotic medicine, finish it all even if you start to feel better.  Avoid smoking and secondhand smoke. Exposure to cigarette smoke or irritating chemicals will make bronchitis worse. If you are a smoker, consider using nicotine gum or skin patches to help control withdrawal symptoms. Quitting smoking will help your lungs heal faster.   Reduce the chances of another bout of acute bronchitis by washing your hands frequently, avoiding people with cold symptoms, and trying not to touch your hands to your mouth, nose, or eyes.   Keep all follow-up visits as directed by your health care provider.  SEEK MEDICAL CARE IF: Your symptoms do not improve after 1 week of treatment.  SEEK IMMEDIATE MEDICAL CARE IF:  You develop an increased fever or chills.   You have chest pain.   You have severe shortness of breath.  You have bloody sputum.   You develop dehydration.  You faint or repeatedly feel like you are going to pass out.  You develop repeated vomiting.  You develop a severe headache. MAKE SURE YOU:   Understand these instructions.  Will watch your condition.  Will get help right away if you are not doing well or get worse.   This information is not intended  to replace advice given to you by your health care provider. Make sure you discuss any questions you have with your health care provider.   Document Released: 02/19/2004 Document Revised: 02/01/2014 Document Reviewed: 07/04/2012 Elsevier Interactive Patient Education [employer].

## 2014-11-27 NOTE — Progress Notes (Signed)
Subjective:    Patient ID: Kristina Ayala, female    DOB: 1945-02-09, 69 y.o.   MRN: [id number]  HPI 69 year old female who presents to the office today with upper respiratory infection type symptoms. Her symptoms started three days ago. She endorses a dry cough and feeling congested/tight in her chest. She has not had a fever. Denies any sinus pain or pressure. No headaches   Review of Systems  Constitutional: Positive for fatigue. Negative for fever, chills, diaphoresis, activity change and appetite change.  HENT: Positive for congestion and postnasal drip. Negative for ear discharge, ear pain, rhinorrhea, sinus pressure, sneezing and trouble swallowing.   Respiratory: Positive for cough and wheezing. Negative for chest tightness, shortness of breath and stridor.   Cardiovascular: Negative.   Neurological: Negative for headaches.  All other systems reviewed and are negative.  Past Medical History  Diagnosis Date  . Hypertension   . Arthritis   . Bursitis     left elbow  . Infectious colitis   . Clostridium difficile infection   . HLD (hyperlipidemia)   . Frequent falls     Social History   Social History  . Marital Status: Divorced    Spouse Name: N/A  . Number of Children: 2  . Years of Education: N/A   Occupational History  . RETIRED    Social History Main Topics  . Smoking status: Former Smoker -- 0.25 packs/day for 50 years    Types: Cigarettes    Quit date: 08/28/2014  . Smokeless tobacco: Never Used     Comment: 1 pack every 3 days  . Alcohol Use: No  . Drug Use: No  . Sexual Activity: Not on file   Other Topics Concern  . Not on file   Social History Narrative   Grandson (45 y/o) lives with patient.     Son lives in [place]   Daughter lives in [place]   She is originally from [place], [place]   Divorced             Past Surgical History  Procedure Laterality Date  . Tonsillectomy    . Orif proximal tibial plateau fracture Left 02/19/2012    . Polypectomy    . Colonoscopy      Family History  Problem Relation Age of Onset  . Diverticulosis Mother   . Ovarian cancer Mother   . Pancreatic cancer Father   . Lung cancer Father   . Colon polyps Sister   . Colon cancer Neg Hx     Allergies  Allergen Reactions  . Doxycycline Diarrhea  . Minocycline Diarrhea  . Wellbutrin [Bupropion] Swelling    Per pt her tongue was swollen and sore/symptoms stopped once the medication was discontinued.   [name] Penicillins Rash    Current Outpatient Prescriptions on File Prior to Visit  Medication Sig Dispense Refill  . acetaminophen (TYLENOL) 500 MG tablet Take 1,000 mg by mouth every 6 (six) hours as needed (For burn pain.).    [name] ALPRAZolam (XANAX) 0.5 MG tablet Take 1 tablet (0.5 mg total) by mouth at bedtime as needed for anxiety. 30 tablet 0  . amLODipine (NORVASC) 5 MG tablet Take 1 tablet (5 mg total) by mouth daily. 90 tablet 1  . atorvastatin (LIPITOR) 40 MG tablet Take 1 tablet (40 mg total) by mouth every morning. 90 tablet 1  . calcium-vitamin D (OSCAL WITH D) 500-200 MG-UNIT per tablet Take 1 tablet by mouth daily with breakfast.     .  cholecalciferol (VITAMIN D) 1000 UNITS tablet Take 1,000 Units by mouth daily.    . Cyanocobalamin (VITAMIN B-12) 2500 MCG SUBL Place 1 tablet under the tongue daily.    . ferrous sulfate 325 (65 FE) MG tablet Take 1 tablet (325 mg total) by mouth daily with breakfast. (Patient taking differently: Take 325 mg by mouth 3 (three) times a week. ) 30 tablet 3  . hydrochlorothiazide (HYDRODIURIL) 25 MG tablet Take 1 tablet (25 mg total) by mouth every morning. 90 tablet 1  . lisinopril (PRINIVIL,ZESTRIL) 40 MG tablet Take 1 tablet (40 mg total) by mouth every morning. 90 tablet 1  . Magnesium 100 MG CAPS Take 100 mg by mouth daily.    . potassium chloride (K-DUR) 10 MEQ tablet Take 1 tablet (10 mEq total) by mouth daily. 90 tablet 1  . TURMERIC PO Take 1 tablet by mouth daily.     No current  facility-administered medications on file prior to visit.    BP 130/86 mmHg  Pulse 108  Temp(Src) 98.8 F (37.1 C) (Oral)  Ht 5\' 5"  (1.651 m)  Wt 167 lb 11.2 oz (76.068 kg)  BMI 27.91 kg/m2  SpO2 97%       Objective:   Physical Exam  Constitutional: She is oriented to person, place, and time. She appears well-developed and well-nourished. No distress.  HENT:  Head: Normocephalic and atraumatic.  Right Ear: External ear normal.  Left Ear: External ear normal.  Nose: Nose normal.  Mouth/Throat: Oropharynx is clear and moist. No oropharyngeal exudate (TM's visulized. No signs of nfection).  Cardiovascular: Normal rate, regular rhythm, normal heart sounds and intact distal pulses.  Exam reveals no gallop and no friction rub.   No murmur heard. Pulmonary/Chest: Effort normal and breath sounds normal. No respiratory distress. She has no wheezes. She has no rales. She exhibits no tenderness.  diminished in bases  Musculoskeletal: Normal range of motion. She exhibits no tenderness.  Lymphadenopathy:    She has no cervical adenopathy.  Neurological: She is alert and oriented to person, place, and time.  Skin: Skin is warm and dry. No rash noted. She is not diaphoretic. No erythema. No pallor.  Psychiatric: She has a normal mood and affect. Her behavior is normal. Judgment and thought content normal.  Nursing note and vitals reviewed.      Assessment & Plan:  1. Acute bronchitis, unspecified organism  - methylPREDNISolone (MEDROL DOSEPAK) 4 MG TBPK tablet; Take as directed  Dispense: 21 tablet; Refill: 0 - HYDROcodone-homatropine (HYCODAN) 5-1.5 MG/5ML syrup; Take 5 mLs by mouth every 8 (eight) hours as needed for cough.  Dispense: 120 mL; Refill: 0

## 2014-11-27 NOTE — Progress Notes (Signed)
Pre visit review using our clinic review tool, if applicable. No additional management support is needed unless otherwise documented below in the visit note. 

## 2014-12-03 ENCOUNTER — Telehealth (HOSPITAL_COMMUNITY): Payer: Self-pay | Admitting: *Deleted

## 2014-12-03 NOTE — Telephone Encounter (Signed)
Left message on voicemail in reference to upcoming appointment scheduled for 12/05/14. Phone number given for a call back so details instructions can be given. Kristina Ayala, [name]

## 2014-12-05 ENCOUNTER — Telehealth: Payer: Self-pay | Admitting: Internal Medicine

## 2014-12-05 ENCOUNTER — Encounter (HOSPITAL_COMMUNITY): Payer: Medicare Other

## 2014-12-05 NOTE — Telephone Encounter (Signed)
Spoke to patient and she will try to take the prednisone again

## 2014-12-05 NOTE — Telephone Encounter (Signed)
Pt saw cory on 11-27-14 and was given prednisone. Pt only took one day of med due to headaches. Pt would like something else call into [facility] on [address]

## 2014-12-05 NOTE — Telephone Encounter (Signed)
pls advise

## 2014-12-09 ENCOUNTER — Telehealth: Payer: Self-pay | Admitting: Family Medicine

## 2014-12-09 MED ORDER — IBUPROFEN-FAMOTIDINE 800-26.6 MG PO TABS: ORAL_TABLET | ORAL | Status: DC

## 2014-12-09 NOTE — Telephone Encounter (Signed)
Patient is requesting med refill on duexis to be sent to [facility] on [address]

## 2014-12-09 NOTE — Telephone Encounter (Signed)
rx sent into pharmacy

## 2014-12-16 DIAGNOSIS — L602 Onychogryphosis: Secondary | ICD-10-CM | POA: Diagnosis not present

## 2014-12-16 DIAGNOSIS — L84 Corns and callosities: Secondary | ICD-10-CM | POA: Diagnosis not present

## 2014-12-17 ENCOUNTER — Encounter: Payer: Self-pay | Admitting: Family Medicine

## 2014-12-17 ENCOUNTER — Ambulatory Visit (INDEPENDENT_AMBULATORY_CARE_PROVIDER_SITE_OTHER): Payer: Medicare Other | Admitting: Family Medicine

## 2014-12-17 VITALS — BP 132/70 | HR 105 | Ht 65.0 in | Wt 170.0 lb

## 2014-12-17 DIAGNOSIS — M5416 Radiculopathy, lumbar region: Secondary | ICD-10-CM | POA: Diagnosis not present

## 2014-12-17 MED ORDER — MELOXICAM 15 MG PO TABS: 15.0000 mg | ORAL_TABLET | Freq: Every day | ORAL | Status: DC

## 2014-12-17 MED ORDER — TIZANIDINE HCL 4 MG PO TABS: 4.0000 mg | ORAL_TABLET | Freq: Every evening | ORAL | Status: DC

## 2014-12-17 NOTE — Patient Instructions (Signed)
Good to see you [phone] for [facility] We will get MRI see me 1-2 days after Meloxicam daily for pain but stop it if it hurts your stomahc Zanaflex at night if needed but do not take if taking care of the baby.  Happy holidays!

## 2014-12-17 NOTE — Progress Notes (Signed)
Pre visit review using our clinic review tool, if applicable. No additional management support is needed unless otherwise documented below in the visit note. 

## 2014-12-17 NOTE — Progress Notes (Signed)
  [name] [place], [place] [address] Phone: [phone] Subjective:    I'm seeing this patient by the request  of:  Drema Pry, DO   CC: Back pain follow up  [id number] Kristina Ayala is a 69 y.o. female coming in with complaint of 2 different problems  2. Back pain. Continues to have significant pain. Patient does have x-rays showing degenerative disc disease at L1-L2 and L4-L5 as well as significant corticated sacroiliac joint.  Patient was having radicular symptoms but encouraged to try conservative therapy. Patient states that she was to do the home exercises, range of motion, over-the-counter medications. Patient states she saw another provider in the interim and was referred to physical therapy. Patient states she is not made any significant improvement. Continues to have the radicular symptoms going down the left leg. Mild weakness where she feels like she may fall from time to time. Did have significant stressful situation with her daughter who was sick and was unfortunate having a to care for her granddaughter 24 hours daily for approximately 2 weeks. Distress the seem to exacerbate the problem.      Past medical history, social, surgical and family history all reviewed in electronic medical record.   Review of Systems: No headache, visual changes, nausea, vomiting, diarrhea, constipation, dizziness, abdominal pain, skin rash, fevers, chills, night sweats, weight loss, swollen lymph nodes, body aches, joint swelling, muscle aches, chest pain, shortness of breath, mood changes.   Objective Blood pressure 132/70, pulse 105, height 5\' 5"  (1.651 m), weight 170 lb (77.111 kg), SpO2 96 %.  General: No apparent distress alert and oriented x3 mood and affect normal, dressed appropriately.  HEENT: Pupils equal, extraocular movements intact  Respiratory: Patient's speak in full sentences and does not appear short of breath    Cardiovascular: No lower extremity edema, non tender, no erythema  Skin: Warm dry intact with no signs of infection or rash on extremities or on axial skeleton.  Abdomen: Soft nontender  Neuro: Cranial nerves II through XII are intact, neurovascularly intact in all extremities with 2+ DTRs and 2+ pulses.  Lymph: No lymphadenopathy of posterior or anterior cervical chain or axillae bilaterally.  Gait normal with good balance and coordination.  MSK:  Non tender with full range of motion and good stability and symmetric strength and tone of shoulders, elbows, wrist, hip, and ankles bilaterally.  Back Exam:  Inspection: mild straightening of the lumbar spine Motion: Flexion 30 deg, Extension 25 deg, Side Bending to 25 deg bilaterally,  Rotation to 45 deg bilaterally  SLR laying: Continued to be positive on the left side XSLR laying: Negative  Palpable tenderness: Tenderness in the paraspinal musculature on the left side and lumbar spineworse than right. Tenderness over the sacroiliac joints bilaterally seems to be more tender than previous exam FABER: very tight bilaterally Sensory change: Gross sensation intact to all lumbar and sacral dermatomes.  Reflexes: 2+ at both patellar tendons, 2+ at achilles tendons, Babinski's downgoing.  Strength at foot  Plantar-flexion: 5/5 Dorsi-flexion: 5/5 Eversion: 5/5 Inversion: 5/5  Leg strength  Quad: 5/5 Hamstring: 5/5 Hip flexor: 4/5 on left compared to full strength on right Hip abductors: 3+/5 minimal improvement Gait cautious but normal     Impression and Recommendations:     This case required medical decision making of moderate complexity.

## 2014-12-17 NOTE — Assessment & Plan Note (Signed)
Not responding to conservative therapy. Has been to formal physical therapy but continued to have pain. Continues to have the radicular symptoms down the leg. Mild weakness of the hip flexor compared to previous exam. Deep tendon reflexes are intact. No bowel or bladder incontinence. Discussed with patient though that she has failed all other conservative therapy and I do feel that advance imaging is warranted. MRI of the lumbar spine as ordered today. Depending on findings this will change her medical management. Patient come back 1-2 days after and we will discuss treatment options.  Spent  25 minutes with patient face-to-face and had greater than 50% of counseling including as described above in assessment and plan.

## 2014-12-30 ENCOUNTER — Telehealth (HOSPITAL_COMMUNITY): Payer: Self-pay | Admitting: *Deleted

## 2014-12-30 NOTE — Telephone Encounter (Signed)
Patient given detailed instructions per Myocardial Perfusion Study Information Sheet for the test on 01/01/15 at 1245. Patient notified to arrive 15 minutes early and that it is imperative to arrive on time for appointment to keep from having the test rescheduled.  If you need to cancel or reschedule your appointment, please call the office within 24 hours of your appointment. Failure to do so may result in a cancellation of your appointment, and a $50 no show fee. Patient verbalized understanding.Terriann Difonzo, [name]

## 2015-01-01 ENCOUNTER — Ambulatory Visit: Payer: Medicare Other | Admitting: Cardiovascular Disease

## 2015-01-01 ENCOUNTER — Ambulatory Visit (HOSPITAL_COMMUNITY): Payer: Medicare Other | Attending: Cardiovascular Disease

## 2015-01-01 DIAGNOSIS — R0602 Shortness of breath: Secondary | ICD-10-CM | POA: Diagnosis not present

## 2015-01-01 DIAGNOSIS — I10 Essential (primary) hypertension: Secondary | ICD-10-CM | POA: Diagnosis not present

## 2015-01-01 DIAGNOSIS — R072 Precordial pain: Secondary | ICD-10-CM | POA: Diagnosis not present

## 2015-01-01 LAB — MYOCARDIAL PERFUSION IMAGING
CHL CUP NUCLEAR SSS: 1
CSEPPHR: 115 {beats}/min
LVDIAVOL: 75 mL
LVSYSVOL: 25 mL
NUC STRESS TID: 1.13
RATE: 0.28
Rest HR: 78 {beats}/min
SDS: 0
SRS: 1

## 2015-01-01 MED ORDER — REGADENOSON 0.4 MG/5ML IV SOLN
0.4000 mg | Freq: Once | INTRAVENOUS | Status: AC
  Administered 2015-01-01: 0.4 mg via INTRAVENOUS

## 2015-01-01 MED ORDER — TECHNETIUM TC 99M SESTAMIBI GENERIC - CARDIOLITE
10.9000 | Freq: Once | INTRAVENOUS | Status: AC | PRN
  Administered 2015-01-01: 10.9 via INTRAVENOUS

## 2015-01-01 MED ORDER — TECHNETIUM TC 99M SESTAMIBI GENERIC - CARDIOLITE
32.8000 | Freq: Once | INTRAVENOUS | Status: AC | PRN
  Administered 2015-01-01: 32.8 via INTRAVENOUS

## 2015-01-03 ENCOUNTER — Telehealth: Payer: Self-pay | Admitting: Cardiovascular Disease

## 2015-01-03 NOTE — Telephone Encounter (Signed)
Notified of stress test results. 

## 2015-01-03 NOTE — Telephone Encounter (Signed)
New message ° ° ° ° ° °Calling to get stress test results °

## 2015-01-04 ENCOUNTER — Other Ambulatory Visit: Payer: Self-pay | Admitting: Internal Medicine

## 2015-01-08 ENCOUNTER — Ambulatory Visit: Payer: Medicare Other | Admitting: Physical Therapy

## 2015-01-24 ENCOUNTER — Telehealth: Payer: Self-pay | Admitting: Internal Medicine

## 2015-01-24 NOTE — Telephone Encounter (Signed)
Pt request refill of the following: HYDROcodone-homatropine (HYCODAN) 5-1.5 MG/5ML syrup  Pt ask if she could get refill    Phamacy:

## 2015-01-28 ENCOUNTER — Encounter: Payer: Self-pay | Admitting: Family Medicine

## 2015-01-28 ENCOUNTER — Ambulatory Visit (INDEPENDENT_AMBULATORY_CARE_PROVIDER_SITE_OTHER): Payer: Medicare Other | Admitting: Family Medicine

## 2015-01-28 VITALS — BP 118/80 | HR 90 | Temp 98.1°F | Ht 65.0 in | Wt 166.5 lb

## 2015-01-28 DIAGNOSIS — J069 Acute upper respiratory infection, unspecified: Secondary | ICD-10-CM | POA: Diagnosis not present

## 2015-01-28 DIAGNOSIS — J209 Acute bronchitis, unspecified: Secondary | ICD-10-CM

## 2015-01-28 MED ORDER — HYDROCODONE-HOMATROPINE 5-1.5 MG/5ML PO SYRP: 5.0000 mL | ORAL_SOLUTION | Freq: Three times a day (TID) | ORAL | Status: DC | PRN

## 2015-01-28 NOTE — Telephone Encounter (Signed)
Noted.  Refill not sent.

## 2015-01-28 NOTE — Progress Notes (Signed)
HPI:  URI: -started: 4 days ago -symptoms:nasal congestion, sore throat, cough, PND -denies:fever, SOB, NVD, tooth pain -has tried: OTC options, report hycodan cough medication always helps and she tolerates well when has a cold and requests refill -sick contacts/travel/risks: denies flu exposure, tick exposure or or Ebola risks - granddaughter with bronchiolitis -Hx of: allergies, COPD on PMH but she denies this, also denies using xanax  ROS: See pertinent positives and negatives per HPI.  Past Medical History  Diagnosis Date  . Hypertension   . Arthritis   . Bursitis     left elbow  . Infectious colitis   . Clostridium difficile infection   . HLD (hyperlipidemia)   . Frequent falls     Past Surgical History  Procedure Laterality Date  . Tonsillectomy    . Orif proximal tibial plateau fracture Left 02/19/2012  . Polypectomy    . Colonoscopy      Family History  Problem Relation Age of Onset  . Diverticulosis Mother   . Ovarian cancer Mother   . Pancreatic cancer Father   . Lung cancer Father   . Colon polyps Sister   . Colon cancer Neg Hx     Social History   Social History  . Marital Status: Divorced    Spouse Name: N/A  . Number of Children: 2  . Years of Education: N/A   Occupational History  . RETIRED    Social History Main Topics  . Smoking status: Former Smoker -- 0.25 packs/day for 50 years    Types: Cigarettes    Quit date: 08/28/2014  . Smokeless tobacco: Never Used     Comment: 1 pack every 3 days  . Alcohol Use: No  . Drug Use: No  . Sexual Activity: Not Asked   Other Topics Concern  . None   Social History Narrative   Grandson (28 y/o) lives with patient.     Son lives in [place]   Daughter lives in [place]   She is originally from [place], [place]   Divorced              Current outpatient prescriptions:  .  acetaminophen (TYLENOL) 500 MG tablet, Take 1,000 mg by mouth every 6 (six) hours as needed (For burn pain.)., Disp:  , Rfl:  .  amLODipine (NORVASC) 5 MG tablet, TAKE 1 TABLET(5 MG) BY MOUTH DAILY, Disp: 90 tablet, Rfl: 0 .  atorvastatin (LIPITOR) 40 MG tablet, Take 1 tablet (40 mg total) by mouth every morning., Disp: 90 tablet, Rfl: 1 .  calcium-vitamin D (OSCAL WITH D) 500-200 MG-UNIT per tablet, Take 1 tablet by mouth daily with breakfast. , Disp: , Rfl:  .  cholecalciferol (VITAMIN D) 1000 UNITS tablet, Take 1,000 Units by mouth daily., Disp: , Rfl:  .  Cyanocobalamin (VITAMIN B-12) 2500 MCG SUBL, Place 1 tablet under the tongue daily., Disp: , Rfl:  .  ferrous sulfate 325 (65 FE) MG tablet, Take 1 tablet (325 mg total) by mouth daily with breakfast. (Patient taking differently: Take 325 mg by mouth 3 (three) times a week. ), Disp: 30 tablet, Rfl: 3 .  hydrochlorothiazide (HYDRODIURIL) 25 MG tablet, TAKE 1 TABLET(25 MG) BY MOUTH EVERY MORNING, Disp: 90 tablet, Rfl: 0 .  HYDROcodone-homatropine (HYCODAN) 5-1.5 MG/5ML syrup, Take 5 mLs by mouth every 8 (eight) hours as needed for cough., Disp: 120 mL, Rfl: 0 .  Ibuprofen-Famotidine 800-26.6 MG TABS, TAKE 1 TABLET DAILY AS NEEDED., Disp: 270 tablet, Rfl: 3 .  lisinopril (  PRINIVIL,ZESTRIL) 40 MG tablet, Take 1 tablet (40 mg total) by mouth every morning., Disp: 90 tablet, Rfl: 1 .  Magnesium 100 MG CAPS, Take 100 mg by mouth daily., Disp: , Rfl:  .  meloxicam (MOBIC) 15 MG tablet, Take 1 tablet (15 mg total) by mouth daily., Disp: 30 tablet, Rfl: 0 .  methylPREDNISolone (MEDROL DOSEPAK) 4 MG TBPK tablet, Take as directed, Disp: 21 tablet, Rfl: 0 .  potassium chloride (K-DUR) 10 MEQ tablet, TAKE 1 TABLET(10 MEQ) BY MOUTH DAILY, Disp: 90 tablet, Rfl: 0 .  tiZANidine (ZANAFLEX) 4 MG tablet, Take 1 tablet (4 mg total) by mouth Nightly., Disp: 30 tablet, Rfl: 2 .  TURMERIC PO, Take 1 tablet by mouth daily., Disp: , Rfl:   EXAM:  Filed Vitals:   01/28/15 1121  BP: 118/80  Pulse: 90  Temp: 98.1 F (36.7 C)    Body mass index is 27.71 kg/(m^2).  GENERAL:  vitals reviewed and listed above, alert, oriented, appears well hydrated and in no acute distress  HEENT: atraumatic, conjunttiva clear, no obvious abnormalities on inspection of external nose and ears, normal appearance of ear canals and TMs, clear nasal congestion, mild post oropharyngeal erythema with PND, no tonsillar edema or exudate, no sinus TTP  NECK: no obvious masses on inspection  LUNGS: clear to auscultation bilaterally, no wheezes, rales or rhonchi, good air movement  CV: HRRR, no peripheral edema  MS: moves all extremities without noticeable abnormality  PSYCH: pleasant and cooperative, no obvious depression or anxiety  ASSESSMENT AND PLAN:  Discussed the following assessment and plan:  Acute upper respiratory infection  Acute bronchitis, unspecified organism - Plan: HYDROcodone-homatropine (HYCODAN) 5-1.5 MG/5ML syrup  -given HPI and exam findings today, a serious infection or illness is unlikely. We discussed potential etiologies, with VURI being most likely, and advised supportive care and monitoring. We discussed treatment side effects, likely course, antibiotic misuse, transmission, and signs of developing a serious illness. -of course, we advised to return or notify a doctor immediately if symptoms worsen or persist or new concerns arise.    Patient Instructions  INSTRUCTIONS FOR UPPER RESPIRATORY INFECTION:  -plenty of rest and fluids  -nasal saline wash 2-3 times daily (use prepackaged nasal saline or bottled/distilled water if making your own)   -can use AFRIN nasal spray for drainage and nasal congestion - but do NOT use longer then 3-4 days  -can use tylenol (in no history of liver disease) or ibuprofen (if no history of kidney disease, bowel bleeding or significant heart disease) as directed for aches and sorethroat  -in the winter time, using a humidifier at night is helpful (please follow cleaning instructions)  -if you are taking a cough medication  - use only as directed, may also try a teaspoon of honey to coat the throat and throat lozenges. If given a cough medication with codeine or hydrocodone or other narcotic please be advised that this contains a strong and  potentially addicting medication. Please follow instructions carefully, take as little as possible and only use AS NEEDED for severe cough. Discuss potential side effects with your pharmacy. Please do not drive or operate machinery while taking these types of medications. Please do not take other sedating medications, drugs or alcohol while taking this medication without discussing with your doctor.  -for sore throat, salt water gargles can help  -follow up if you have fevers, facial pain, tooth pain, difficulty breathing or are worsening or symptoms persist longer then expected  Upper Respiratory Infection,  Adult An upper respiratory infection (URI) is also known as the common cold. It is often caused by a type of germ (virus). Colds are easily spread (contagious). You can pass it to others by kissing, coughing, sneezing, or drinking out of the same glass. Usually, you get better in 1 to 3  weeks.  However, the cough can last for even longer. HOME CARE   Only take medicine as told by your doctor. Follow instructions provided above.  Drink enough water and fluids to keep your pee (urine) clear or pale yellow.  Get plenty of rest.  Return to work when your temperature is < 100 for 24 hours or as told by your doctor. You may use a face mask and wash your hands to stop your cold from spreading. GET HELP RIGHT AWAY IF:   After the first few days, you feel you are getting worse.  You have questions about your medicine.  You have chills, shortness of breath, or red spit (mucus).  You have pain in the face for more then 1-2 days, especially when you bend forward.  You have a fever, puffy (swollen) neck, pain when you swallow, or white spots in the back of your throat.  You  have a bad headache, ear pain, sinus pain, or chest pain.  You have a high-pitched whistling sound when you breathe in and out (wheezing).  You cough up blood.  You have sore muscles or a stiff neck. MAKE SURE YOU:   Understand these instructions.  Will watch your condition.  Will get help right away if you are not doing well or get worse. Document Released: 06/30/2007 Document Revised: 04/05/2011 Document Reviewed: 04/18/2013 [facility] Patient Information 2015 [place], [place]. This information is not intended to replace advice given to you by your health care provider. Make sure you discuss any questions you have with your health care provider.      Colin Benton R.

## 2015-01-28 NOTE — Patient Instructions (Signed)

## 2015-01-28 NOTE — Progress Notes (Signed)
Pre visit review using our clinic review tool, if applicable. No additional management support is needed unless otherwise documented below in the visit note. 

## 2015-01-28 NOTE — Telephone Encounter (Signed)
She was given rx for hydrocodone-homatropine by Dr. Maudie Mercury today 01/28/2015.  Why is she requesting refill

## 2015-02-10 ENCOUNTER — Other Ambulatory Visit: Payer: Self-pay | Admitting: Internal Medicine

## 2015-02-18 DIAGNOSIS — L84 Corns and callosities: Secondary | ICD-10-CM | POA: Diagnosis not present

## 2015-02-18 DIAGNOSIS — E1351 Other specified diabetes mellitus with diabetic peripheral angiopathy without gangrene: Secondary | ICD-10-CM | POA: Diagnosis not present

## 2015-02-18 DIAGNOSIS — L602 Onychogryphosis: Secondary | ICD-10-CM | POA: Diagnosis not present

## 2015-02-19 ENCOUNTER — Ambulatory Visit: Payer: Medicare Other | Admitting: Adult Health

## 2015-02-19 ENCOUNTER — Encounter: Payer: Self-pay | Admitting: Adult Health

## 2015-02-19 ENCOUNTER — Ambulatory Visit: Payer: Medicare Other | Admitting: Internal Medicine

## 2015-02-19 ENCOUNTER — Ambulatory Visit (INDEPENDENT_AMBULATORY_CARE_PROVIDER_SITE_OTHER): Payer: Medicare Other | Admitting: Adult Health

## 2015-02-19 VITALS — BP 144/80 | Temp 99.0°F | Ht 65.0 in | Wt 167.8 lb

## 2015-02-19 DIAGNOSIS — R300 Dysuria: Secondary | ICD-10-CM

## 2015-02-19 DIAGNOSIS — R350 Frequency of micturition: Secondary | ICD-10-CM

## 2015-02-19 DIAGNOSIS — R1031 Right lower quadrant pain: Secondary | ICD-10-CM | POA: Diagnosis not present

## 2015-02-19 LAB — CBC WITH DIFFERENTIAL/PLATELET
BASOS ABS: 0 10*3/uL (ref 0.0–0.1)
Basophils Relative: 0.6 % (ref 0.0–3.0)
EOS ABS: 0.2 10*3/uL (ref 0.0–0.7)
Eosinophils Relative: 3.2 % (ref 0.0–5.0)
HEMATOCRIT: 40.9 % (ref 36.0–46.0)
HEMOGLOBIN: 13.3 g/dL (ref 12.0–15.0)
LYMPHS PCT: 43.7 % (ref 12.0–46.0)
Lymphs Abs: 3.2 10*3/uL (ref 0.7–4.0)
MCHC: 32.4 g/dL (ref 30.0–36.0)
MCV: 84 fl (ref 78.0–100.0)
MONOS PCT: 8 % (ref 3.0–12.0)
Monocytes Absolute: 0.6 10*3/uL (ref 0.1–1.0)
Neutro Abs: 3.2 10*3/uL (ref 1.4–7.7)
Neutrophils Relative %: 44.5 % (ref 43.0–77.0)
Platelets: 296 10*3/uL (ref 150.0–400.0)
RBC: 4.87 Mil/uL (ref 3.87–5.11)
RDW: 15.1 % (ref 11.5–15.5)
WBC: 7.3 10*3/uL (ref 4.0–10.5)

## 2015-02-19 LAB — POCT URINALYSIS DIPSTICK
BILIRUBIN UA: NEGATIVE
Blood, UA: NEGATIVE
Glucose, UA: NEGATIVE
KETONES UA: NEGATIVE
LEUKOCYTES UA: NEGATIVE
Nitrite, UA: NEGATIVE
PH UA: 5.5
Protein, UA: NEGATIVE
Urobilinogen, UA: 0.2

## 2015-02-19 LAB — BASIC METABOLIC PANEL
BUN: 15 mg/dL (ref 6–23)
CALCIUM: 10.1 mg/dL (ref 8.4–10.5)
CHLORIDE: 106 meq/L (ref 96–112)
CO2: 32 meq/L (ref 19–32)
CREATININE: 0.8 mg/dL (ref 0.40–1.20)
GFR: 90.95 mL/min (ref >60.00)
Glucose, Bld: 91 mg/dL (ref 70–99)
Potassium: 4.5 mEq/L (ref 3.5–5.1)
Sodium: 145 mEq/L (ref 135–145)

## 2015-02-19 LAB — LIPASE: Lipase: 36 U/L (ref 11.0–59.0)

## 2015-02-19 LAB — HEPATIC FUNCTION PANEL
ALT: 37 U/L — AB (ref 0–35)
AST: 24 U/L (ref 0–37)
Albumin: 4.5 g/dL (ref 3.5–5.2)
Alkaline Phosphatase: 81 U/L (ref 39–117)
BILIRUBIN TOTAL: 0.4 mg/dL (ref 0.2–1.2)
Bilirubin, Direct: 0.1 mg/dL (ref 0.0–0.3)
TOTAL PROTEIN: 7 g/dL (ref 6.0–8.3)

## 2015-02-19 NOTE — Patient Instructions (Addendum)
It was great seeing you again today!  I am not sure what is causing your abdominal pain, it may just be muscle but I would like to get a CT to make sure.   I will follow up with you when I get the results.   You can try taking tylenol or ibuprofen and using a heating pad for discomfort.   Abdominal Pain, Adult Many things can cause abdominal pain. Usually, abdominal pain is not caused by a disease and will improve without treatment. It can often be observed and treated at home. Your health care provider will do a physical exam and possibly order blood tests and X-rays to help determine the seriousness of your pain. However, in many cases, more time must pass before a clear cause of the pain can be found. Before that point, your health care provider may not know if you need more testing or further treatment. HOME CARE INSTRUCTIONS Monitor your abdominal pain for any changes. The following actions may help to alleviate any discomfort you are experiencing:  Only take over-the-counter or prescription medicines as directed by your health care provider.  Do not take laxatives unless directed to do so by your health care provider.  Try a clear liquid diet (broth, tea, or water) as directed by your health care provider. Slowly move to a bland diet as tolerated. SEEK MEDICAL CARE IF:  You have unexplained abdominal pain.  You have abdominal pain associated with nausea or diarrhea.  You have pain when you urinate or have a bowel movement.  You experience abdominal pain that wakes you in the night.  You have abdominal pain that is worsened or improved by eating food.  You have abdominal pain that is worsened with eating fatty foods.  You have a fever. SEEK IMMEDIATE MEDICAL CARE IF:  Your pain does not go away within 2 hours.  You keep throwing up (vomiting).  Your pain is felt only in portions of the abdomen, such as the right side or the left lower portion of the abdomen.  You pass  bloody or black tarry stools. MAKE SURE YOU:  Understand these instructions.  Will watch your condition.  Will get help right away if you are not doing well or get worse.   This information is not intended to replace advice given to you by your health care provider. Make sure you discuss any questions you have with your health care provider.   Document Released: 10/21/2004 Document Revised: 10/02/2014 Document Reviewed: 09/20/2012 Elsevier Interactive Patient Education [employer].

## 2015-02-19 NOTE — Progress Notes (Signed)
Subjective:    Patient ID: Kristina Ayala, female    DOB: [birth year], 70 y.o.   MRN: [id number]  HPI 70 year old that presents to the office today for pain in right groin for two weeks. The pain is intermittent and achy. She has not tried OTC medications or used a heating pad. She feels like it is more of a pulled muscle from carrying her gradn daughter.   No vaginal discharge , no dysuria, no hesitancy. She does endorced feeling like she is having urinary frequency. Denies any changes in bowel. No nausea, vomiting, diarrhea, or fever.   Review of Systems  Constitutional: Negative.   Respiratory: Negative.   Cardiovascular: Negative.   Gastrointestinal: Positive for abdominal pain. Negative for nausea, vomiting, diarrhea, constipation, blood in stool, abdominal distention, anal bleeding and rectal pain.  Genitourinary: Positive for frequency. Negative for dysuria, urgency, hematuria, decreased urine volume, vaginal bleeding, vaginal discharge, vaginal pain and pelvic pain.  Musculoskeletal: Negative.   All other systems reviewed and are negative.  Past Medical History  Diagnosis Date  . Hypertension   . Arthritis   . Bursitis     left elbow  . Infectious colitis   . Clostridium difficile infection   . HLD (hyperlipidemia)   . Frequent falls     Social History   Social History  . Marital Status: Divorced    Spouse Name: N/A  . Number of Children: 2  . Years of Education: N/A   Occupational History  . RETIRED    Social History Main Topics  . Smoking status: Former Smoker -- 0.25 packs/day for 50 years    Types: Cigarettes    Quit date: 08/28/2014  . Smokeless tobacco: Never Used     Comment: 1 pack every 3 days  . Alcohol Use: No  . Drug Use: No  . Sexual Activity: Not on file   Other Topics Concern  . Not on file   Social History Narrative   Grandson (20 y/o) lives with patient.     Son lives in [place]   Daughter lives in [place]   She is originally  from [place], [place]   Divorced             Past Surgical History  Procedure Laterality Date  . Tonsillectomy    . Orif proximal tibial plateau fracture Left 02/19/2012  . Polypectomy    . Colonoscopy      Family History  Problem Relation Age of Onset  . Diverticulosis Mother   . Ovarian cancer Mother   . Pancreatic cancer Father   . Lung cancer Father   . Colon polyps Sister   . Colon cancer Neg Hx     Allergies  Allergen Reactions  . Doxycycline Diarrhea  . Minocycline Diarrhea  . Wellbutrin [Bupropion] Swelling    Per pt her tongue was swollen and sore/symptoms stopped once the medication was discontinued.   [name] Penicillins Rash    Current Outpatient Prescriptions on File Prior to Visit  Medication Sig Dispense Refill  . acetaminophen (TYLENOL) 500 MG tablet Take 1,000 mg by mouth every 6 (six) hours as needed (For burn pain.).    [name] amLODipine (NORVASC) 5 MG tablet TAKE 1 TABLET(5 MG) BY MOUTH DAILY 90 tablet 0  . atorvastatin (LIPITOR) 40 MG tablet TAKE 1 TABLET(40 MG) BY MOUTH EVERY MORNING 90 tablet 0  . calcium-vitamin D (OSCAL WITH D) 500-200 MG-UNIT per tablet Take 1 tablet by mouth daily with breakfast.     .  cholecalciferol (VITAMIN D) 1000 UNITS tablet Take 1,000 Units by mouth daily.    . Cyanocobalamin (VITAMIN B-12) 2500 MCG SUBL Place 1 tablet under the tongue daily.    . ferrous sulfate 325 (65 FE) MG tablet Take 1 tablet (325 mg total) by mouth daily with breakfast. (Patient taking differently: Take 325 mg by mouth 3 (three) times a week. ) 30 tablet 3  . hydrochlorothiazide (HYDRODIURIL) 25 MG tablet TAKE 1 TABLET(25 MG) BY MOUTH EVERY MORNING 90 tablet 0  . HYDROcodone-homatropine (HYCODAN) 5-1.5 MG/5ML syrup Take 5 mLs by mouth every 8 (eight) hours as needed for cough. 120 mL 0  . Ibuprofen-Famotidine 800-26.6 MG TABS TAKE 1 TABLET DAILY AS NEEDED. 270 tablet 3  . lisinopril (PRINIVIL,ZESTRIL) 40 MG tablet Take 1 tablet (40 mg total) by mouth every  morning. 90 tablet 1  . Magnesium 100 MG CAPS Take 100 mg by mouth daily.    . meloxicam (MOBIC) 15 MG tablet Take 1 tablet (15 mg total) by mouth daily. 30 tablet 0  . methylPREDNISolone (MEDROL DOSEPAK) 4 MG TBPK tablet Take as directed 21 tablet 0  . potassium chloride (K-DUR) 10 MEQ tablet TAKE 1 TABLET(10 MEQ) BY MOUTH DAILY 90 tablet 0  . tiZANidine (ZANAFLEX) 4 MG tablet Take 1 tablet (4 mg total) by mouth Nightly. 30 tablet 2  . TURMERIC PO Take 1 tablet by mouth daily.     No current facility-administered medications on file prior to visit.    BP 144/80 mmHg  Temp(Src) 99 F (37.2 C) (Oral)  Ht 5\' 5"  (1.651 m)  Wt 167 lb 12.8 oz (76.114 kg)  BMI 27.92 kg/m2       Objective:   Physical Exam  Constitutional: She is oriented to person, place, and time. She appears well-developed and well-nourished. No distress.  Neck: Normal range of motion. Neck supple. No thyromegaly present.  Cardiovascular: Normal rate, regular rhythm, normal heart sounds and intact distal pulses.  Exam reveals no gallop and no friction rub.   No murmur heard. Pulmonary/Chest: Effort normal and breath sounds normal. No respiratory distress. She has no wheezes. She has no rales. She exhibits no tenderness.  Abdominal: Soft. Bowel sounds are normal. She exhibits no distension and no mass. There is tenderness (right lower quadrant). There is no rebound and no guarding.  Significant pain in right lower quadrant over McBurney's point  Lymphadenopathy:    She has no cervical adenopathy.  Neurological: She is alert and oriented to person, place, and time.  Skin: Skin is warm and dry. No rash noted. She is not diaphoretic. No erythema. No pallor.  Psychiatric: She has a normal mood and affect. Her behavior is normal. Judgment and thought content normal.  Nursing note and vitals reviewed.     Assessment & Plan:   1. Right lower quadrant abdominal pain - Unlikely appendicitis due to length of time. She does  not have a fever or appear acutely ill. Maybe MSK in nature, diverticulitis, or renal colic.  - POCT urinalysis dipstick; Standing - POCT urinalysis dipstick - CT Abdomen Pelvis W Contrast; Future - Basic metabolic panel - Hepatic function panel - CBC with Differential/Platelet - Lipase - Ibuprofen or tylenol and heating pad 2. Urinary frequency - POCT urinalysis dipstick; Standing- Negative for UTI - POCT urinalysis dipstick

## 2015-02-21 ENCOUNTER — Ambulatory Visit (INDEPENDENT_AMBULATORY_CARE_PROVIDER_SITE_OTHER)
Admission: RE | Admit: 2015-02-21 | Discharge: 2015-02-21 | Disposition: A | Discharge status: Home / Self Care | Payer: Medicare Other | Source: Ambulatory Visit | Attending: Adult Health | Admitting: Adult Health

## 2015-02-21 DIAGNOSIS — R1031 Right lower quadrant pain: Secondary | ICD-10-CM | POA: Diagnosis not present

## 2015-02-21 MED ORDER — IOHEXOL 300 MG/ML  SOLN
100.0000 mL | Freq: Once | INTRAMUSCULAR | Status: AC | PRN
  Administered 2015-02-21: 100 mL via INTRAVENOUS

## 2015-03-20 ENCOUNTER — Telehealth: Payer: Self-pay | Admitting: Internal Medicine

## 2015-03-20 NOTE — Telephone Encounter (Signed)
Pt would like to transfer from Dr Shawna Orleans to [place].  Is this ok with both of you?

## 2015-03-20 NOTE — Telephone Encounter (Signed)
Ok with me 

## 2015-03-21 NOTE — Telephone Encounter (Signed)
Ok with me 

## 2015-03-28 DIAGNOSIS — H938X3 Other specified disorders of ear, bilateral: Secondary | ICD-10-CM | POA: Diagnosis not present

## 2015-03-28 DIAGNOSIS — H9042 Sensorineural hearing loss, unilateral, left ear, with unrestricted hearing on the contralateral side: Secondary | ICD-10-CM | POA: Diagnosis not present

## 2015-03-28 DIAGNOSIS — H93293 Other abnormal auditory perceptions, bilateral: Secondary | ICD-10-CM | POA: Diagnosis not present

## 2015-03-31 ENCOUNTER — Encounter: Payer: Self-pay | Admitting: Adult Health

## 2015-03-31 ENCOUNTER — Ambulatory Visit (INDEPENDENT_AMBULATORY_CARE_PROVIDER_SITE_OTHER): Payer: Medicare Other | Admitting: Adult Health

## 2015-03-31 VITALS — BP 140/82 | HR 107 | Temp 99.1°F | Ht 65.0 in | Wt 170.3 lb

## 2015-03-31 DIAGNOSIS — J302 Other seasonal allergic rhinitis: Secondary | ICD-10-CM

## 2015-03-31 NOTE — Patient Instructions (Addendum)
It was great seeing you again.   Your exam is consistent with seasonal allergies.   Get Flonase and Claritin D or Allegra D at the drug store and use these every day.   If you are not feeling any better in the next few days, please let me know.   Hay Fever Hay fever is an allergic reaction to particles in the air. It cannot be passed from person to person. It cannot be cured, but it can be controlled. CAUSES  Hay fever is caused by something that triggers an allergic reaction (allergens). The following are examples of allergens:  Ragweed.  Feathers.  Animal dander.  Grass and tree pollens.  Cigarette smoke.  House dust.  Pollution. SYMPTOMS   Sneezing.  Runny or stuffy nose.  Tearing eyes.  Itchy eyes, nose, mouth, throat, skin, or other area.  Sore throat.  Headache.  Decreased sense of smell or taste. DIAGNOSIS Your caregiver will perform a physical exam and ask questions about the symptoms you are having.Allergy testing may be done to determine exactly what triggers your hay fever.  TREATMENT   Over-the-counter medicines may help symptoms. These include:  Antihistamines.  Decongestants. These may help with nasal congestion.  Your caregiver may prescribe medicines if over-the-counter medicines do not work.  Some people benefit from allergy shots when other medicines are not helpful. HOME CARE INSTRUCTIONS   Avoid the allergen that is causing your symptoms, if possible.  Take all medicine as told by your caregiver. SEEK MEDICAL CARE IF:   You have severe allergy symptoms and your current medicines are not helping.  Your treatment was working at one time, but you are now experiencing symptoms.  You have sinus congestion and pressure.  You develop a fever or headache.  You have thick nasal discharge.  You have asthma and have a worsening cough and wheezing. SEEK IMMEDIATE MEDICAL CARE IF:   You have swelling of your tongue or lips.  You  have trouble breathing.  You feel lightheaded or like you are going to faint.  You have cold sweats.  You have a fever.   This information is not intended to replace advice given to you by your health care provider. Make sure you discuss any questions you have with your health care provider.   Document Released: 01/11/2005 Document Revised: 04/05/2011 Document Reviewed: 07/24/2014 Elsevier Interactive Patient Education [employer].

## 2015-03-31 NOTE — Progress Notes (Signed)
Pre visit review using our clinic review tool, if applicable. No additional management support is needed unless otherwise documented below in the visit note. 

## 2015-03-31 NOTE — Progress Notes (Signed)
Subjective:    Patient ID: Kristina Ayala, female    DOB: 02-Jun-1945, 70 y.o.   MRN: [id number]  HPI  70 year old female who presents to the office with the chief complaint of " a head cold". Her symptoms started 3 days ago with sinus congestion, sinus pain and pressure, headaches, subjective low grade fever, and itchy watery eyes.  She has tried OTC allergy medication which have helped a little.   Denies any n/v/d   Review of Systems  Constitutional: Positive for fever, chills and appetite change.  HENT: Positive for congestion, postnasal drip, rhinorrhea and sinus pressure. Negative for ear discharge, ear pain and sore throat.   Eyes: Positive for discharge and itching. Negative for photophobia, pain, redness and visual disturbance.  Respiratory: Negative.   Cardiovascular: Negative.   Musculoskeletal: Positive for myalgias.  Skin: Negative.   Neurological: Positive for headaches.  All other systems reviewed and are negative.  Past Medical History  Diagnosis Date  . Hypertension   . Arthritis   . Bursitis     left elbow  . Infectious colitis   . Clostridium difficile infection   . HLD (hyperlipidemia)   . Frequent falls     Social History   Social History  . Marital Status: Divorced    Spouse Name: N/A  . Number of Children: 2  . Years of Education: N/A   Occupational History  . RETIRED    Social History Main Topics  . Smoking status: Former Smoker -- 0.25 packs/day for 50 years    Types: Cigarettes    Quit date: 08/28/2014  . Smokeless tobacco: Never Used     Comment: 1 pack every 3 days  . Alcohol Use: No  . Drug Use: No  . Sexual Activity: Not on file   Other Topics Concern  . Not on file   Social History Narrative   Grandson (42 y/o) lives with patient.     Son lives in [place]   Daughter lives in [place]   She is originally from [place], [place]   Divorced             Past Surgical History  Procedure Laterality Date  .  Tonsillectomy    . Orif proximal tibial plateau fracture Left 02/19/2012  . Polypectomy    . Colonoscopy      Family History  Problem Relation Age of Onset  . Diverticulosis Mother   . Ovarian cancer Mother   . Pancreatic cancer Father   . Lung cancer Father   . Colon polyps Sister   . Colon cancer Neg Hx     Allergies  Allergen Reactions  . Doxycycline Diarrhea  . Minocycline Diarrhea  . Wellbutrin [Bupropion] Swelling    Per pt her tongue was swollen and sore/symptoms stopped once the medication was discontinued.   [name] Penicillins Rash    Current Outpatient Prescriptions on File Prior to Visit  Medication Sig Dispense Refill  . acetaminophen (TYLENOL) 500 MG tablet Take 1,000 mg by mouth every 6 (six) hours as needed (For burn pain.).    [name] amLODipine (NORVASC) 5 MG tablet TAKE 1 TABLET(5 MG) BY MOUTH DAILY 90 tablet 0  . atorvastatin (LIPITOR) 40 MG tablet TAKE 1 TABLET(40 MG) BY MOUTH EVERY MORNING 90 tablet 0  . calcium-vitamin D (OSCAL WITH D) 500-200 MG-UNIT per tablet Take 1 tablet by mouth daily with breakfast.     . cholecalciferol (VITAMIN D) 1000 UNITS tablet Take 1,000 Units by mouth daily.    [name]  Cyanocobalamin (VITAMIN B-12) 2500 MCG SUBL Place 1 tablet under the tongue daily.    . ferrous sulfate 325 (65 FE) MG tablet Take 1 tablet (325 mg total) by mouth daily with breakfast. (Patient taking differently: Take 325 mg by mouth 3 (three) times a week. ) 30 tablet 3  . gabapentin (NEURONTIN) 100 MG capsule   3  . hydrochlorothiazide (HYDRODIURIL) 25 MG tablet TAKE 1 TABLET(25 MG) BY MOUTH EVERY MORNING 90 tablet 0  . HYDROcodone-homatropine (HYCODAN) 5-1.5 MG/5ML syrup Take 5 mLs by mouth every 8 (eight) hours as needed for cough. 120 mL 0  . Ibuprofen-Famotidine 800-26.6 MG TABS TAKE 1 TABLET DAILY AS NEEDED. 270 tablet 3  . lisinopril (PRINIVIL,ZESTRIL) 40 MG tablet Take 1 tablet (40 mg total) by mouth every morning. 90 tablet 1  . Magnesium 100 MG CAPS Take 100 mg by  mouth daily.    . meloxicam (MOBIC) 15 MG tablet Take 1 tablet (15 mg total) by mouth daily. 30 tablet 0  . methylPREDNISolone (MEDROL DOSEPAK) 4 MG TBPK tablet Take as directed 21 tablet 0  . potassium chloride (K-DUR) 10 MEQ tablet TAKE 1 TABLET(10 MEQ) BY MOUTH DAILY 90 tablet 0  . tiZANidine (ZANAFLEX) 4 MG tablet Take 1 tablet (4 mg total) by mouth Nightly. 30 tablet 2  . TURMERIC PO Take 1 tablet by mouth daily.     No current facility-administered medications on file prior to visit.    BP 140/82 mmHg  Pulse 107  Temp(Src) 99.1 F (37.3 C) (Oral)  Ht 5\' 5"  (1.651 m)  Wt 170 lb 4.8 oz (77.248 kg)  BMI 28.34 kg/m2  SpO2 97%       Objective:   Physical Exam  Constitutional: She is oriented to person, place, and time. She appears well-developed and well-nourished. No distress.  HENT:  Head: Normocephalic and atraumatic.  Right Ear: External ear normal.  Left Ear: External ear normal.  Nose: Mucosal edema and rhinorrhea present. Right sinus exhibits frontal sinus tenderness. Right sinus exhibits no maxillary sinus tenderness. Left sinus exhibits frontal sinus tenderness. Left sinus exhibits no maxillary sinus tenderness.  Mouth/Throat: Oropharynx is clear and moist. No oropharyngeal exudate.  Eyes: Conjunctivae are normal. Pupils are equal, round, and reactive to light. Right eye exhibits no discharge. Left eye exhibits no discharge.  Neck: Normal range of motion. Neck supple. No thyromegaly present.  Cardiovascular: Normal rate, regular rhythm, normal heart sounds and intact distal pulses.  Exam reveals no friction rub.   No murmur heard. Musculoskeletal: Normal range of motion.  Lymphadenopathy:    She has no cervical adenopathy.  Neurological: She is alert and oriented to person, place, and time.  Skin: Skin is warm and dry. No rash noted. She is not diaphoretic. No erythema.  Psychiatric: She has a normal mood and affect. Her behavior is normal. Judgment and thought  content normal.  Nursing note and vitals reviewed.     Assessment & Plan:  1. Seasonal allergies - Flonase - Claritin D - Fluids and rest - Follow up if no improvement in the next 2-3 days.

## 2015-04-15 ENCOUNTER — Other Ambulatory Visit: Payer: Self-pay

## 2015-04-15 DIAGNOSIS — Z1231 Encounter for screening mammogram for malignant neoplasm of breast: Secondary | ICD-10-CM

## 2015-04-30 ENCOUNTER — Other Ambulatory Visit: Payer: Medicare Other

## 2015-04-30 ENCOUNTER — Ambulatory Visit (INDEPENDENT_AMBULATORY_CARE_PROVIDER_SITE_OTHER): Payer: Medicare Other | Admitting: Internal Medicine

## 2015-04-30 ENCOUNTER — Encounter: Payer: Self-pay | Admitting: Internal Medicine

## 2015-04-30 VITALS — BP 132/76 | HR 90 | Temp 98.5°F | Resp 16 | Wt 176.0 lb

## 2015-04-30 DIAGNOSIS — E785 Hyperlipidemia, unspecified: Secondary | ICD-10-CM | POA: Diagnosis not present

## 2015-04-30 DIAGNOSIS — Z139 Encounter for screening, unspecified: Secondary | ICD-10-CM

## 2015-04-30 DIAGNOSIS — Z23 Encounter for immunization: Secondary | ICD-10-CM | POA: Diagnosis not present

## 2015-04-30 DIAGNOSIS — J438 Other emphysema: Secondary | ICD-10-CM

## 2015-04-30 DIAGNOSIS — M5416 Radiculopathy, lumbar region: Secondary | ICD-10-CM | POA: Diagnosis not present

## 2015-04-30 DIAGNOSIS — I10 Essential (primary) hypertension: Secondary | ICD-10-CM | POA: Diagnosis not present

## 2015-04-30 LAB — HEPATITIS C ANTIBODY: HCV Ab: NEGATIVE

## 2015-04-30 MED ORDER — LISINOPRIL 40 MG PO TABS: 40.0000 mg | ORAL_TABLET | Freq: Every morning | ORAL | Status: DC

## 2015-04-30 MED ORDER — HYDROCHLOROTHIAZIDE 25 MG PO TABS: ORAL_TABLET | ORAL | Status: DC

## 2015-04-30 MED ORDER — AMLODIPINE BESYLATE 5 MG PO TABS: ORAL_TABLET | ORAL | Status: DC

## 2015-04-30 MED ORDER — ATORVASTATIN CALCIUM 40 MG PO TABS: ORAL_TABLET | ORAL | Status: DC

## 2015-04-30 MED ORDER — POTASSIUM CHLORIDE ER 10 MEQ PO TBCR: EXTENDED_RELEASE_TABLET | ORAL | Status: DC

## 2015-04-30 NOTE — Assessment & Plan Note (Signed)
Taking gabapentin Is not taking the meloxicam or tizanidine Working on increasing her exercise

## 2015-04-30 NOTE — Progress Notes (Signed)
Subjective:    Patient ID: Kristina Ayala, female    DOB: Dec 17, 1945, 70 y.o.   MRN: [id number]  HPI She is here to establish with a new pcp.   Hypertension: She is taking her medication daily. She is compliant with a low sodium diet.  She denies chest pain, palpitations, edema, shortness of breath and regular headaches. She is exercising But not enough.  She does not monitor her blood pressure at home.    Hyperlipidemia: She is taking her medication daily. She is compliant with a low fat/cholesterol diet. She is exercising But not enough. She denies myalgias.   Back pain: She has chronic back pain and has seen Dr. Tamala Julian. She is taking gabapentin as needed, but denies taking meloxicam or muscle relaxer. She is exercising, but knows she needs to increase her exercise.  Overall she feels well besides the back pain.  Medications and allergies reviewed with patient and updated if appropriate.  Patient Active Problem List   Diagnosis Date Noted  . Lumbar radiculopathy 09/19/2014  . Knee contusion 09/19/2014  . Preventative health care 01/04/2014  . Low back pain 01/04/2014  . Bilateral knee pain 01/04/2014  . Diverticulosis of colon without hemorrhage 07/26/2013  . Diarrhea 07/26/2013  . Trigger thumb of left hand 03/06/2013  . Dyslipidemia 02/15/2013  . Essential hypertension, benign 02/15/2013  . Hyperlipidemia 09/28/2012  . COPD (chronic obstructive pulmonary disease) ([place]) 05/22/2012  . Frequent falls 05/22/2012  . Tobacco use disorder 05/22/2012  . Osteoporosis, unspecified 05/22/2012  . Chest pain 11/12/2011  . Hypertension 08/04/2011    Current Outpatient Prescriptions on File Prior to Visit  Medication Sig Dispense Refill  . acetaminophen (TYLENOL) 500 MG tablet Take 1,000 mg by mouth every 6 (six) hours as needed (For burn pain.).    [name] amLODipine (NORVASC) 5 MG tablet TAKE 1 TABLET(5 MG) BY MOUTH DAILY 90 tablet 0  . atorvastatin (LIPITOR) 40 MG tablet TAKE 1  TABLET(40 MG) BY MOUTH EVERY MORNING 90 tablet 0  . calcium-vitamin D (OSCAL WITH D) 500-200 MG-UNIT per tablet Take 1 tablet by mouth daily with breakfast.     . cholecalciferol (VITAMIN D) 1000 UNITS tablet Take 1,000 Units by mouth daily.    . Cyanocobalamin (VITAMIN B-12) 2500 MCG SUBL Place 1 tablet under the tongue daily.    . ferrous sulfate 325 (65 FE) MG tablet Take 1 tablet (325 mg total) by mouth daily with breakfast. (Patient taking differently: Take 325 mg by mouth 3 (three) times a week. ) 30 tablet 3  . gabapentin (NEURONTIN) 100 MG capsule   3  . hydrochlorothiazide (HYDRODIURIL) 25 MG tablet TAKE 1 TABLET(25 MG) BY MOUTH EVERY MORNING 90 tablet 0  . Ibuprofen-Famotidine 800-26.6 MG TABS TAKE 1 TABLET DAILY AS NEEDED. 270 tablet 3  . lisinopril (PRINIVIL,ZESTRIL) 40 MG tablet Take 1 tablet (40 mg total) by mouth every morning. 90 tablet 1  . Magnesium 100 MG CAPS Take 100 mg by mouth daily.    . meloxicam (MOBIC) 15 MG tablet Take 1 tablet (15 mg total) by mouth daily. 30 tablet 0  . methylPREDNISolone (MEDROL DOSEPAK) 4 MG TBPK tablet Take as directed 21 tablet 0  . potassium chloride (K-DUR) 10 MEQ tablet TAKE 1 TABLET(10 MEQ) BY MOUTH DAILY 90 tablet 0  . tiZANidine (ZANAFLEX) 4 MG tablet Take 1 tablet (4 mg total) by mouth Nightly. 30 tablet 2  . TURMERIC PO Take 1 tablet by mouth daily.  No current facility-administered medications on file prior to visit.    Past Medical History  Diagnosis Date  . Hypertension   . Arthritis   . Bursitis     left elbow  . Infectious colitis   . Clostridium difficile infection   . HLD (hyperlipidemia)   . Frequent falls     Past Surgical History  Procedure Laterality Date  . Tonsillectomy    . Orif proximal tibial plateau fracture Left 02/19/2012  . Polypectomy    . Colonoscopy      Social History   Social History  . Marital Status: Divorced    Spouse Name: N/A  . Number of Children: 2  . Years of Education: N/A    Occupational History  . RETIRED    Social History Main Topics  . Smoking status: Former Smoker -- 0.25 packs/day for 50 years    Types: Cigarettes    Quit date: 08/28/2014  . Smokeless tobacco: Never Used     Comment: 1 pack every 3 days  . Alcohol Use: No  . Drug Use: No  . Sexual Activity: Not on file   Other Topics Concern  . Not on file   Social History Narrative   Grandson (76 y/o) lives with patient.     Son lives in [place]   Daughter lives in [place]   She is originally from [place], [place]   Divorced             Family History  Problem Relation Age of Onset  . Diverticulosis Mother   . Ovarian cancer Mother   . Pancreatic cancer Father   . Lung cancer Father   . Colon polyps Sister   . Colon cancer Neg Hx     Review of Systems  Constitutional: Negative for fever and fatigue.  Respiratory: Negative for cough, shortness of breath and wheezing.   Cardiovascular: Negative for chest pain, palpitations and leg swelling.  Musculoskeletal: Positive for back pain (lower back ).  Neurological: Negative for dizziness, light-headedness and headaches.       Objective:   Filed Vitals:   04/30/15 1509  BP: 132/76  Pulse: 90  Temp: 98.5 F (36.9 C)  Resp: 16   Filed Weights   04/30/15 1509  Weight: 176 lb (79.833 kg)   Body mass index is 29.29 kg/(m^2).   Physical Exam Constitutional: Appears well-developed and well-nourished. No distress.  Neck: Neck supple. No tracheal deviation present. No thyromegaly present.  No carotid bruit. No cervical adenopathy.   Cardiovascular: Normal rate, regular rhythm and normal heart sounds.   No murmur heard.  No edema Pulmonary/Chest: Effort normal and breath sounds normal. No respiratory distress. No wheezes.       Assessment & Plan:    prevnar today  See Problem List for Assessment and Plan of chronic medical problems.  Follow up in 4 months

## 2015-04-30 NOTE — Assessment & Plan Note (Signed)
No current symptoms of cough, wheeze or shortness of breath

## 2015-04-30 NOTE — Assessment & Plan Note (Signed)
Taking atorvastatin 40 mg daily Last lipid panel well controlled-we'll recheck at next visit Continue to work on increasing exercise

## 2015-04-30 NOTE — Progress Notes (Signed)
Pre visit review using our clinic review tool, if applicable. No additional management support is needed unless otherwise documented below in the visit note. 

## 2015-04-30 NOTE — Patient Instructions (Addendum)
   All other Health Maintenance issues reviewed.   All recommended immunizations and age-appropriate screenings are up-to-date or discussed.  prevnar vaccine administered today.   Medications reviewed and updated.  No changes recommended at this time.  Your prescription(s) have been submitted to your pharmacy. Please take as directed and contact our office if you believe you are having problem(s) with the medication(s).   Please followup in 4 months

## 2015-04-30 NOTE — Assessment & Plan Note (Signed)
BP well controlled Current regimen effective and well tolerated Continue current medications at current doses  

## 2015-05-07 DIAGNOSIS — L820 Inflamed seborrheic keratosis: Secondary | ICD-10-CM | POA: Diagnosis not present

## 2015-05-20 DIAGNOSIS — L602 Onychogryphosis: Secondary | ICD-10-CM | POA: Diagnosis not present

## 2015-05-20 DIAGNOSIS — L84 Corns and callosities: Secondary | ICD-10-CM | POA: Diagnosis not present

## 2015-05-25 ENCOUNTER — Emergency Department (HOSPITAL_COMMUNITY): Payer: Medicare Other

## 2015-05-25 ENCOUNTER — Emergency Department (HOSPITAL_COMMUNITY)
Admission: EM | Admit: 2015-05-25 | Discharge: 2015-05-25 | Disposition: A | Discharge status: Home / Self Care | Payer: Medicare Other | Attending: Emergency Medicine | Admitting: Emergency Medicine

## 2015-05-25 ENCOUNTER — Encounter (HOSPITAL_COMMUNITY): Payer: Self-pay | Admitting: Emergency Medicine

## 2015-05-25 DIAGNOSIS — W010XXA Fall on same level from slipping, tripping and stumbling without subsequent striking against object, initial encounter: Secondary | ICD-10-CM | POA: Diagnosis not present

## 2015-05-25 DIAGNOSIS — S86911A Strain of unspecified muscle(s) and tendon(s) at lower leg level, right leg, initial encounter: Secondary | ICD-10-CM | POA: Insufficient documentation

## 2015-05-25 DIAGNOSIS — Z79899 Other long term (current) drug therapy: Secondary | ICD-10-CM | POA: Diagnosis not present

## 2015-05-25 DIAGNOSIS — M25461 Effusion, right knee: Secondary | ICD-10-CM | POA: Diagnosis not present

## 2015-05-25 DIAGNOSIS — Y9301 Activity, walking, marching and hiking: Secondary | ICD-10-CM | POA: Diagnosis not present

## 2015-05-25 DIAGNOSIS — S8991XA Unspecified injury of right lower leg, initial encounter: Secondary | ICD-10-CM | POA: Diagnosis present

## 2015-05-25 DIAGNOSIS — Y998 Other external cause status: Secondary | ICD-10-CM | POA: Diagnosis not present

## 2015-05-25 DIAGNOSIS — M25551 Pain in right hip: Secondary | ICD-10-CM | POA: Diagnosis not present

## 2015-05-25 DIAGNOSIS — E785 Hyperlipidemia, unspecified: Secondary | ICD-10-CM | POA: Diagnosis not present

## 2015-05-25 DIAGNOSIS — S7001XA Contusion of right hip, initial encounter: Secondary | ICD-10-CM

## 2015-05-25 DIAGNOSIS — Z87891 Personal history of nicotine dependence: Secondary | ICD-10-CM | POA: Insufficient documentation

## 2015-05-25 DIAGNOSIS — Y9289 Other specified places as the place of occurrence of the external cause: Secondary | ICD-10-CM | POA: Diagnosis not present

## 2015-05-25 DIAGNOSIS — M1990 Unspecified osteoarthritis, unspecified site: Secondary | ICD-10-CM | POA: Diagnosis not present

## 2015-05-25 DIAGNOSIS — I10 Essential (primary) hypertension: Secondary | ICD-10-CM | POA: Diagnosis not present

## 2015-05-25 DIAGNOSIS — Z791 Long term (current) use of non-steroidal anti-inflammatories (NSAID): Secondary | ICD-10-CM | POA: Diagnosis not present

## 2015-05-25 DIAGNOSIS — Z880 Allergy status to penicillin: Secondary | ICD-10-CM | POA: Diagnosis not present

## 2015-05-25 DIAGNOSIS — S8391XA Sprain of unspecified site of right knee, initial encounter: Secondary | ICD-10-CM | POA: Insufficient documentation

## 2015-05-25 DIAGNOSIS — Z8619 Personal history of other infectious and parasitic diseases: Secondary | ICD-10-CM | POA: Insufficient documentation

## 2015-05-25 DIAGNOSIS — S79911A Unspecified injury of right hip, initial encounter: Secondary | ICD-10-CM | POA: Diagnosis not present

## 2015-05-25 NOTE — ED Notes (Signed)
Pt reports she fell on to R side on Friday. Thinks she tripped over her shoes. Complains of R hip pain and posterior knee pain. Pt able to bear weight.

## 2015-05-25 NOTE — ED Provider Notes (Signed)
CSN: [id number]     Arrival date & time 05/25/15  0701 History   First MD Initiated Contact with Patient 05/25/15 [phone]     Chief Complaint  Patient presents with  . Fall  . Hip Pain  . Knee Pain   HPI Patient presents to the emergency room for evaluation of right hip and right knee pain. Patient was walking on Friday. She thinks she may have tripped and stumbled.  Patient was walking and she lost her balance and fell onto her right knee. She does have a history of falling but usually lands on the other side. The patient did not lose consciousness. She was not feeling weak or dizzy. She denies any focal numbness or weakness. She has been walking around the last couple of days since the injury however she continues to have pain in the posterior aspect of her right hip as well as the posterior aspect of her right knee. She came in for further evaluation. Past Medical History  Diagnosis Date  . Hypertension   . Arthritis   . Bursitis     left elbow  . Infectious colitis   . Clostridium difficile infection   . HLD (hyperlipidemia)   . Frequent falls    Past Surgical History  Procedure Laterality Date  . Tonsillectomy    . Orif proximal tibial plateau fracture Left 02/19/2012  . Polypectomy    . Colonoscopy     Family History  Problem Relation Age of Onset  . Diverticulosis Mother   . Ovarian cancer Mother   . Pancreatic cancer Father   . Lung cancer Father   . Colon polyps Sister   . Colon cancer Neg Hx    Social History  Substance Use Topics  . Smoking status: Former Smoker -- 0.25 packs/day for 50 years    Types: Cigarettes    Quit date: 08/28/2014  . Smokeless tobacco: Never Used     Comment: 1 pack every 3 days  . Alcohol Use: No   OB History    No data available     Review of Systems  All other systems reviewed and are negative.     Allergies  Doxycycline; Minocycline; Wellbutrin; and Penicillins  Home Medications   Prior to Admission medications    Medication Sig Start Date End Date Taking? Authorizing Provider  acetaminophen (TYLENOL) 500 MG tablet Take 1,000 mg by mouth every 6 (six) hours as needed (For burn pain.).    Historical Provider, MD  amLODipine (NORVASC) 5 MG tablet TAKE 1 TABLET(5 MG) BY MOUTH DAILY 04/30/15   [name], MD  atorvastatin (LIPITOR) 40 MG tablet TAKE 1 TABLET(40 MG) BY MOUTH EVERY MORNING 04/30/15   [name], MD  calcium-vitamin D (OSCAL WITH D) 500-200 MG-UNIT per tablet Take 1 tablet by mouth daily with breakfast.     Historical Provider, MD  cholecalciferol (VITAMIN D) 1000 UNITS tablet Take 1,000 Units by mouth daily.    Historical Provider, MD  Cyanocobalamin (VITAMIN B-12) 2500 MCG SUBL Place 1 tablet under the tongue daily.    Historical Provider, MD  ferrous sulfate 325 (65 FE) MG tablet Take 1 tablet (325 mg total) by mouth daily with breakfast. Patient taking differently: Take 325 mg by mouth 3 (three) times a week.  03/29/14   Lyndal Pulley, DO  gabapentin (NEURONTIN) 100 MG capsule  12/09/14   Historical Provider, MD  hydrochlorothiazide (HYDRODIURIL) 25 MG tablet TAKE 1 TABLET(25 MG) BY MOUTH EVERY MORNING 04/30/15  [name], MD  Ibuprofen-Famotidine 800-26.6 MG TABS TAKE 1 TABLET DAILY AS NEEDED. 12/09/14   Lyndal Pulley, DO  lisinopril (PRINIVIL,ZESTRIL) 40 MG tablet Take 1 tablet (40 mg total) by mouth every morning. 04/30/15   [name], MD  Magnesium 100 MG CAPS Take 100 mg by mouth daily.    Historical Provider, MD  meloxicam (MOBIC) 15 MG tablet Take 1 tablet (15 mg total) by mouth daily. 12/17/14   Lyndal Pulley, DO  potassium chloride (K-DUR) 10 MEQ tablet TAKE 1 TABLET(10 MEQ) BY MOUTH DAILY 04/30/15   [name], MD  tiZANidine (ZANAFLEX) 4 MG tablet Take 1 tablet (4 mg total) by mouth Nightly. 12/17/14   Lyndal Pulley, DO  TURMERIC PO Take 1 tablet by mouth daily.    Historical Provider, MD   BP 123/88 mmHg  Pulse 86  Temp(Src) 99.1 F (37.3 C) (Oral)  Resp 18   SpO2 100% Physical Exam  Constitutional: She appears well-developed and well-nourished. No distress.  HENT:  Head: Normocephalic and atraumatic.  Right Ear: External ear normal.  Left Ear: External ear normal.  Eyes: Conjunctivae are normal. Right eye exhibits no discharge. Left eye exhibits no discharge. No scleral icterus.  Neck: Neck supple. No tracheal deviation present.  Cardiovascular: Normal rate.   Pulmonary/Chest: Effort normal. No stridor. No respiratory distress.  Musculoskeletal: She exhibits no edema.       Right hip: She exhibits tenderness (posterior aspect of right hip and buttock). She exhibits normal range of motion, no swelling and no deformity.       Right knee: She exhibits no swelling, no deformity, no laceration and no erythema. Tenderness (posterior aspect of knee, ttp ilotibial band) found.       Cervical back: Normal.       Thoracic back: Normal.       Lumbar back: Normal.  Neurological: She is alert. Cranial nerve deficit: no gross deficits.  Skin: Skin is warm and dry. No rash noted.  Psychiatric: She has a normal mood and affect.  Nursing note and vitals reviewed.   ED Course  Procedures (including critical care time) Labs Review Labs Reviewed - No data to display  Imaging Review Dg Knee Complete 4 Views Right  05/25/2015  CLINICAL DATA:  Fall 3 days ago and landed on right knee and hip; pain in right hip and knee, most pain in posterior right knee; no previous injury EXAM: RIGHT KNEE - COMPLETE 4+ VIEW COMPARISON:  None. FINDINGS: There is no fracture or dislocation. There is no significant degenerative change. There is a small joint effusion. IMPRESSION: Small joint effusion with no acute osseous abnormalities. Electronically Signed   By: Skipper Cliche M.D.   On: 05/25/2015 09:03   Dg Hip Unilat With Pelvis 2-3 Views Right  05/25/2015  CLINICAL DATA:  Fall 3 days ago and landed on right knee and hip; pain in right hip and knee, most pain in posterior  right knee; no previous injury : DG HIP (WITH OR WITHOUT PELVIS) 2-3V RIGHT COMPARISON:  None. FINDINGS: There is no evidence of hip fracture or dislocation. There is no evidence of arthropathy or other focal bone abnormality. IMPRESSION: Negative. Electronically Signed   By: Skipper Cliche M.D.   On: 05/25/2015 09:02   I have personally reviewed and evaluated these images and lab results as part of my medical decision-making.    MDM   Final diagnoses:  Knee sprain and strain, right, initial encounter  Contusion, hip, right, initial encounter    Patient's x-rays do not show any evidence of fracture or dislocation. Doubt occult fx.  She is bearing weight without difficulty. There is a small joint effusion noted in the right knee. Possible this could be related to a ligamentous strain.  Patient is stable for outpatient follow-up with orthopedics. Over-the-counter pain medications as needed    Dorie Rank, MD 05/25/15 [phone]

## 2015-05-25 NOTE — Discharge Instructions (Signed)

## 2015-05-28 ENCOUNTER — Ambulatory Visit
Admission: RE | Admit: 2015-05-28 | Discharge: 2015-05-28 | Disposition: A | Discharge status: Home / Self Care | Payer: Medicare Other | Source: Ambulatory Visit

## 2015-05-28 DIAGNOSIS — Z1231 Encounter for screening mammogram for malignant neoplasm of breast: Secondary | ICD-10-CM | POA: Diagnosis not present

## 2015-06-02 ENCOUNTER — Telehealth: Payer: Self-pay

## 2015-06-02 NOTE — Telephone Encounter (Signed)
Maybe Thursday at 10

## 2015-06-02 NOTE — Telephone Encounter (Signed)
lmovm for pt to return call.  

## 2015-06-02 NOTE — Telephone Encounter (Signed)
Patient had a fall last week. And just called to make a hospital FU for going to the ER. The first thing I could find her was on May 23 @1115 . She said that was fine but I told her I would send you all a message and see if you could work her in sooner. Please follow up.

## 2015-06-03 ENCOUNTER — Ambulatory Visit (INDEPENDENT_AMBULATORY_CARE_PROVIDER_SITE_OTHER): Payer: Medicare Other | Admitting: Family Medicine

## 2015-06-03 ENCOUNTER — Ambulatory Visit (INDEPENDENT_AMBULATORY_CARE_PROVIDER_SITE_OTHER)
Admission: RE | Admit: 2015-06-03 | Discharge: 2015-06-03 | Disposition: A | Discharge status: Home / Self Care | Payer: Medicare Other | Source: Ambulatory Visit | Attending: Family Medicine | Admitting: Family Medicine

## 2015-06-03 ENCOUNTER — Ambulatory Visit: Payer: Self-pay

## 2015-06-03 ENCOUNTER — Encounter: Payer: Self-pay | Admitting: Family Medicine

## 2015-06-03 VITALS — BP 128/82 | HR 87 | Ht 65.0 in | Wt 171.0 lb

## 2015-06-03 DIAGNOSIS — M5416 Radiculopathy, lumbar region: Secondary | ICD-10-CM

## 2015-06-03 DIAGNOSIS — S83429A Sprain of lateral collateral ligament of unspecified knee, initial encounter: Secondary | ICD-10-CM | POA: Insufficient documentation

## 2015-06-03 DIAGNOSIS — M23303 Other meniscus derangements, unspecified medial meniscus, right knee: Secondary | ICD-10-CM

## 2015-06-03 DIAGNOSIS — M25561 Pain in right knee: Secondary | ICD-10-CM

## 2015-06-03 DIAGNOSIS — M545 Low back pain, unspecified: Secondary | ICD-10-CM

## 2015-06-03 DIAGNOSIS — S83421A Sprain of lateral collateral ligament of right knee, initial encounter: Secondary | ICD-10-CM

## 2015-06-03 DIAGNOSIS — M23302 Other meniscus derangements, unspecified lateral meniscus, unspecified knee: Secondary | ICD-10-CM | POA: Insufficient documentation

## 2015-06-03 DIAGNOSIS — M23300 Other meniscus derangements, unspecified lateral meniscus, right knee: Secondary | ICD-10-CM

## 2015-06-03 DIAGNOSIS — M23306 Other meniscus derangements, unspecified meniscus, right knee: Secondary | ICD-10-CM

## 2015-06-03 MED ORDER — GABAPENTIN 100 MG PO CAPS: 100.0000 mg | ORAL_CAPSULE | Freq: Every day | ORAL | Status: DC

## 2015-06-03 NOTE — Progress Notes (Signed)
Pre visit review using our clinic review tool, if applicable. No additional management support is needed unless otherwise documented below in the visit note. 

## 2015-06-03 NOTE — Telephone Encounter (Signed)
Discussed with pt & scheduled for today @ 130p.

## 2015-06-03 NOTE — Patient Instructions (Addendum)
Good to see you  I am glad you walking well.  We will give you an injection today  We will call you when we get the right brace.  Duexis 3 times a day for 3 days. Stop if it hurts your stomach  You have a meniscal tear and  Partial LCL tear, will take some time.  COntinue your other medications .  Xray of back when you can  See me again in 2-3 weeks.

## 2015-06-03 NOTE — Progress Notes (Signed)
[name] [place], [place] [address] Phone: [phone] Subjective:     CC: Right knee pain  [id number] Kristina Ayala is a 70 y.o. female coming in with complaint of right knee pain. 10 days ago patient was walking and unfortunate tripped and stumbled falling directly onto her right knee.  Patient did not have any loss of consciousness or any dizziness. Patient states since this time she's been having more of a posterior pain in the right hip as well as the posterior aspect of the right knee. Patient had x-rays in the emergency room. These were evaluated by me. X-rays the patient's right hip are unremarkable for any bony normality's. Patient's right knee shows very mild degenerative changes as well as a very small joint effusion. Patient states since she was seen in the emergency department continues to have dull throbbing aching pain in the posterior aspect of the left knee. Sometimes feels unstable like it wants to give out on her. Also has back pain that seems to be radiating down the posterior aspect the leg. States that the hip pain has got a little better. States though that it is starting to affect daily activities with this soreness of the right lower extremity. Sometimes can be uncomfortable at night as well.     Past Medical History  Diagnosis Date  . Hypertension   . Arthritis   . Bursitis     left elbow  . Infectious colitis   . Clostridium difficile infection   . HLD (hyperlipidemia)   . Frequent falls    Past Surgical History  Procedure Laterality Date  . Tonsillectomy    . Orif proximal tibial plateau fracture Left 02/19/2012  . Polypectomy    . Colonoscopy     Social History   Social History  . Marital Status: Divorced    Spouse Name: N/A  . Number of Children: 2  . Years of Education: N/A   Occupational History  . RETIRED    Social History Main Topics  . Smoking status: Former Smoker -- 0.25 packs/day  for 50 years    Types: Cigarettes    Quit date: 08/28/2014  . Smokeless tobacco: Never Used     Comment: 1 pack every 3 days  . Alcohol Use: No  . Drug Use: No  . Sexual Activity: Not Asked   Other Topics Concern  . None   Social History Narrative   Grandson (31 y/o) lives with patient.     Son lives in [place]   Daughter lives in [place]   She is originally from [place], [place]   Divorced            Allergies  Allergen Reactions  . Doxycycline Diarrhea  . Minocycline Diarrhea  . Wellbutrin [Bupropion] Swelling    Per pt her tongue was swollen and sore/symptoms stopped once the medication was discontinued.   [name] Penicillins Rash   Family History  Problem Relation Age of Onset  . Diverticulosis Mother   . Ovarian cancer Mother   . Pancreatic cancer Father   . Lung cancer Father   . Colon polyps Sister   . Colon cancer Neg Hx     Past medical history, social, surgical and family history all reviewed in electronic medical record.  No pertanent information unless stated regarding to the chief complaint.   Review of Systems: No headache, visual changes, nausea, vomiting, diarrhea, constipation, dizziness, abdominal pain, skin rash, fevers, chills, night sweats, weight  loss, swollen lymph nodes, body aches, joint swelling, muscle aches, chest pain, shortness of breath, mood changes.   Objective Blood pressure 128/82, pulse 87, height 5\' 5"  (1.651 m), weight 171 lb (77.565 kg), SpO2 98 %.  General: No apparent distress alert and oriented x3 mood and affect normal, dressed appropriately.  HEENT: Pupils equal, extraocular movements intact  Respiratory: Patient's speak in full sentences and does not appear short of breath  Cardiovascular: No lower extremity edema, non tender, no erythema  Skin: Warm dry intact with no signs of infection or rash on extremities or on axial skeleton.  Abdomen: Soft nontender  Neuro: Cranial nerves II through XII are intact, neurovascularly intact  in all extremities with 2+ DTRs and 2+ pulses.  Lymph: No lymphadenopathy of posterior or anterior cervical chain or axillae bilaterally.  Gait normal with good balance and coordination.  MSK:  Non tender with full range of motion and good stability and symmetric strength and tone of shoulders, elbows, wrist, hip, and ankles bilaterally. Arthritic changes of multiple joints Back exam shows the patient has only forward flexion of 15 in extension of 5. Patient does have a mild positive straight leg test on the right side. Full strength though. Knee: Right effusion noted Anterolateral joint line tenderness ROM full in flexion and extension and lower leg rotation. Laxity noted of the LCL is to patient's ligaments appear to be intact Positive Mcmurray's, Apley's, and Thessalonian tests. Mild painful patellar compression. Patellar glide with mild crepitus. Patellar and quadriceps tendons unremarkable. Hamstring and quadriceps strength is normal.  Contralateral knee has mild crepitus but no pain  MSK US performed of: Right knee This study was ordered, performed, and interpreted by Charlann Boxer D.O.  Knee: All structures visualized. Large tear of the anterior lateral meniscus. Moderate displacement noted by approximately 50-75%. Hypoechoic changes in the surrounding area. Patient also has a 50% tear of the LCL Patellar Tendon unremarkable on long and transverse views without effusion. No abnormality of prepatellar bursa. LCL and MCL unremarkable on long and transverse views. No abnormality of origin of medial or lateral head of the gastrocnemius.  IMPRESSION:  Acute lateral meniscal tear with LCL partial tear    Impression and Recommendations:     This case required medical decision making of moderate complexity.      Note: This dictation was prepared with Dragon dictation along with smaller phrase technology. Any transcriptional errors that result from this process are  unintentional.

## 2015-06-03 NOTE — Assessment & Plan Note (Signed)
Patient does have a displaced tear of the lateral meniscus as well as an LCL tear. This seems to be partial. I do think the patient will do decent with conservative therapy. Given brace, home exercises, icing protocol and a short course of anti-inflammatories. Did respond well to the injection today. Patient will start with the exercises and see how she does. Follow-up again in 2-3 weeks for further evaluation and treatment. At that time if worsening symptoms we may need to consider advanced imaging.

## 2015-06-03 NOTE — Assessment & Plan Note (Signed)
History of lumbar radiculopathy encourage patient to do gabapentin regularly. Repeat x-rays ordered today to make sure there is no occult fracture from fall. Follow-up with me in 2-3 weeks.

## 2015-06-04 ENCOUNTER — Telehealth: Payer: Self-pay | Admitting: Family Medicine

## 2015-06-04 NOTE — Telephone Encounter (Signed)
Patient states that the medication Dr. Tamala Julian prescribed her yesterday makes her spastic and gives her headaches.  She is requesting a call back in regards.

## 2015-06-04 NOTE — Telephone Encounter (Signed)
lmovm for pt to return call.  

## 2015-06-04 NOTE — Telephone Encounter (Signed)
Spoke with pt, she stated that she took 2 duexis & it gave her a very bad headache. Advised pt not to take anymore. Pt understood.

## 2015-06-17 ENCOUNTER — Other Ambulatory Visit: Payer: Self-pay

## 2015-06-17 ENCOUNTER — Other Ambulatory Visit: Payer: Self-pay | Admitting: Internal Medicine

## 2015-06-17 ENCOUNTER — Encounter: Payer: Self-pay | Admitting: Family Medicine

## 2015-06-17 ENCOUNTER — Ambulatory Visit (INDEPENDENT_AMBULATORY_CARE_PROVIDER_SITE_OTHER): Payer: Medicare Other | Admitting: Family Medicine

## 2015-06-17 VITALS — BP 144/90 | HR 84 | Ht 65.0 in | Wt 168.0 lb

## 2015-06-17 DIAGNOSIS — M545 Low back pain: Secondary | ICD-10-CM | POA: Diagnosis not present

## 2015-06-17 DIAGNOSIS — M23306 Other meniscus derangements, unspecified meniscus, right knee: Secondary | ICD-10-CM

## 2015-06-17 DIAGNOSIS — M25561 Pain in right knee: Secondary | ICD-10-CM

## 2015-06-17 DIAGNOSIS — M23300 Other meniscus derangements, unspecified lateral meniscus, right knee: Secondary | ICD-10-CM

## 2015-06-17 DIAGNOSIS — M23303 Other meniscus derangements, unspecified medial meniscus, right knee: Secondary | ICD-10-CM

## 2015-06-17 DIAGNOSIS — M810 Age-related osteoporosis without current pathological fracture: Secondary | ICD-10-CM | POA: Diagnosis not present

## 2015-06-17 NOTE — Patient Instructions (Signed)
Good to see yo u Ice 20 minutes 2 times daily. Usually after activity and before bed. Continue the exercises 2-3 times a week OK with water aerobics, elliptical and the bike with the pedals.  Physical therapy will be calling you.  See me again in 4-6 weeks and we will make sure you are doing fine.

## 2015-06-17 NOTE — Assessment & Plan Note (Signed)
Patient continues to have some mild pain in this area. I think will take a total of 6-12 weeks for full healing. I do not see any significant displacement on ultrasound today. Likely will do well with conservative therapy. Sent to formal physical therapy. Follow-up again in 6 weeks.

## 2015-06-17 NOTE — Progress Notes (Signed)
[facility] [place], [place] [address] Phone: [phone] Subjective:     CC: Right knee pain Follow-up  [id number] Kristina Ayala is a 70 y.o. female coming in with complaint of right knee pain. Patient was seen previously and did have what appeared to be a small tear of the LCL as well as an acute lateral meniscal tear. Patient states that since the injection and the home exercises that she is feeling 100% better. Not having any significant instability. Patient has been working out some. Patient feels she does not need a brace at the moment. Feels like she is made significant progress.   patient also has chronic back pain. Did have x-rays at last exam that were independently visualized by me. X-rays the patient's spine show moderate spondylosis and degenerative disc disease. Patient states that she continues to have a chronic soreness. Wondering what she can potentially do. No significant progress with home exercises. No progression from previous x-rays 1 year ago.  Past Medical History  Diagnosis Date  . Hypertension   . Arthritis   . Bursitis     left elbow  . Infectious colitis   . Clostridium difficile infection   . HLD (hyperlipidemia)   . Frequent falls    Past Surgical History  Procedure Laterality Date  . Tonsillectomy    . Orif proximal tibial plateau fracture Left 02/19/2012  . Polypectomy    . Colonoscopy     Social History   Social History  . Marital Status: Divorced    Spouse Name: N/A  . Number of Children: 2  . Years of Education: N/A   Occupational History  . RETIRED    Social History Main Topics  . Smoking status: Former Smoker -- 0.25 packs/day for 50 years    Types: Cigarettes    Quit date: 08/28/2014  . Smokeless tobacco: Never Used     Comment: 1 pack every 3 days  . Alcohol Use: No  . Drug Use: No  . Sexual Activity: Not Asked   Other Topics Concern  . None   Social History Narrative   Grandson (45 y/o) lives with patient.     Son lives in [place]   Daughter lives in [place]   She is originally from [place], [place]   Divorced            Allergies  Allergen Reactions  . Doxycycline Diarrhea  . Minocycline Diarrhea  . Wellbutrin [Bupropion] Swelling    Per pt her tongue was swollen and sore/symptoms stopped once the medication was discontinued.   [name] Penicillins Rash   Family History  Problem Relation Age of Onset  . Diverticulosis Mother   . Ovarian cancer Mother   . Pancreatic cancer Father   . Lung cancer Father   . Colon polyps Sister   . Colon cancer Neg Hx     Past medical history, social, surgical and family history all reviewed in electronic medical record.  No pertanent information unless stated regarding to the chief complaint.   Review of Systems: No headache, visual changes, nausea, vomiting, diarrhea, constipation, dizziness, abdominal pain, skin rash, fevers, chills, night sweats, weight loss, swollen lymph nodes, body aches, joint swelling, muscle aches, chest pain, shortness of breath, mood changes.   Objective Blood pressure 144/90, pulse 84, height 5\' 5"  (1.651 m), weight 168 lb (76.204 kg), SpO2 99 %.  General: No apparent distress alert and oriented x3 mood and affect normal, dressed appropriately.  HEENT: Pupils equal, extraocular movements intact  Respiratory: Patient's speak in full sentences and does not appear short of breath  Cardiovascular: No lower extremity edema, non tender, no erythema  Skin: Warm dry intact with no signs of infection or rash on extremities or on axial skeleton.  Abdomen: Soft nontender  Neuro: Cranial nerves II through XII are intact, neurovascularly intact in all extremities with 2+ DTRs and 2+ pulses.  Lymph: No lymphadenopathy of posterior or anterior cervical chain or axillae bilaterally.  Gait normal with good balance and coordination.  MSK:  Non tender with full range of motion and good stability and  symmetric strength and tone of shoulders, elbows, wrist, hip, and ankles bilaterally. Arthritic changes of multiple joints Back exam shows the patient has only forward flexion of 25 in extension of 10. Negative straight leg test which is improvement. Knee: Right Trace effusion noted but improved Continued tenderness over the lateral joint line ROM full in flexion and extension and lower leg rotation. Patient does have improvement with endpoint of the LCL Positive Mcmurray's, Apley's, and Thessalonian tests still present. Mild painful patellar compression. Patellar glide with mild crepitus. Patellar and quadriceps tendons unremarkable. Hamstring and quadriceps strength is normal.  Contralateral knee has mild crepitus but no pain Moderate improvement.  MSK US performed of: Right knee This study was ordered, performed, and interpreted by Charlann Boxer D.O.  Knee: All structures visualized. Patient's tear does not have any significant displacement. Possible mild improvement. Patient's LCL with a tear was seen previously and also has improvement with good scar tissue formation. No gapping on dynamic review. Patellar Tendon unremarkable on long and transverse views without effusion. No abnormality of prepatellar bursa. No abnormality of origin of medial or lateral head of the gastrocnemius.  IMPRESSION:  Acute lateral meniscal tear with LCL partial tear    Impression and Recommendations:     This case required medical decision making of moderate complexity.      Note: This dictation was prepared with Dragon dictation along with smaller phrase technology. Any transcriptional errors that result from this process are unintentional.

## 2015-06-17 NOTE — Progress Notes (Signed)
Pre visit review using our clinic review tool, if applicable. No additional management support is needed unless otherwise documented below in the visit note. 

## 2015-06-17 NOTE — Assessment & Plan Note (Signed)
No longer having a positive straight leg test. Do not feel advance imaging is warranted. Patient encouraged to continue with the home exercises and will be sent to formal physical therapy. We discussed posture, ergonomics and core strengthening. Follow-up in 6 weeks.

## 2015-06-17 NOTE — Assessment & Plan Note (Signed)
Encouraged patient continue with the vitamin D supplementation. We discussed weightbearing exercises.

## 2015-06-30 ENCOUNTER — Ambulatory Visit: Payer: Medicare Other | Attending: Family Medicine

## 2015-06-30 DIAGNOSIS — R2689 Other abnormalities of gait and mobility: Secondary | ICD-10-CM | POA: Diagnosis not present

## 2015-06-30 DIAGNOSIS — M545 Low back pain, unspecified: Secondary | ICD-10-CM

## 2015-06-30 DIAGNOSIS — R29898 Other symptoms and signs involving the musculoskeletal system: Secondary | ICD-10-CM | POA: Diagnosis not present

## 2015-06-30 DIAGNOSIS — M6281 Muscle weakness (generalized): Secondary | ICD-10-CM

## 2015-06-30 NOTE — Therapy (Signed)
[facility] [address], [place], [place], [address] Phone: [phone]   Fax:  [phone]  Physical Therapy Evaluation  Patient Details  Name: Kristina Ayala MRN: [id number] Date of Birth: 1945/02/23 Referring Provider: Hulan Saas, MD  Encounter Date: 06/30/2015      PT End of Session - 06/30/15 1354    Visit Number 1   Number of Visits 10   Date for PT Re-Evaluation 08/25/15   PT Start Time [id number]   PT Stop Time 1310   PT Time Calculation (min) 35 min   Activity Tolerance Patient tolerated treatment well   Behavior During Therapy [facility] for tasks assessed/performed      Past Medical History  Diagnosis Date  . Hypertension   . Arthritis   . Bursitis     left elbow  . Infectious colitis   . Clostridium difficile infection   . HLD (hyperlipidemia)   . Frequent falls     Past Surgical History  Procedure Laterality Date  . Tonsillectomy    . Orif proximal tibial plateau fracture Left 02/19/2012  . Polypectomy    . Colonoscopy      There were no vitals filed for this visit.       Subjective Assessment - 06/30/15 1236    Subjective Pt presents to PT with diagnosis of Lt meniscus tear secondary to fall 05/28/15.  Pt has chronic LBP and balance deficits.  Pt reports that her main concern is her balance at this time.  She is concerned that she is so unsteady.     Pertinent History Chronic history of falling since 2002 (cleared by neurologist and no known cause per pt report) Fall 05/28/15 with Rt knee injury, 3 falls in the past 6 months, Cortizone injection the Rt knee (06/13/15)   Limitations Sitting;Standing   How long can you sit comfortably? 1.5 hours   How long can you stand comfortably? 40 minutes   How long can you walk comfortably? limited due to balance   Patient Stated Goals improve balance, reduce LBP   Currently in Pain? Yes   Pain Score 0-No pain  up to 6-7/10 at times   Pain Location  Back   Pain Orientation Right;Left;Lower   Pain Descriptors / Indicators Squeezing;Aching   Pain Type Chronic pain   Pain Onset More than a month ago   Pain Frequency Intermittent   Aggravating Factors  early in the morning   Pain Relieving Factors moving around            [facility] PT Assessment - 06/30/15 0001    Assessment   Medical Diagnosis lateral meniscus derangement, Rt, bilateral LBP with sciatica   Referring Provider Hulan Saas, MD   Onset Date/Surgical Date 05/28/15  fall   Next MD Visit 07/2015   Prior Therapy balance therapy 2016   Precautions   Precautions Fall   Precaution Comments multiple falls   Balance Screen   Has the patient fallen in the past 6 months Yes   How many times? 3   Has the patient had a decrease in activity level because of a fear of falling?  Yes   Is the patient reluctant to leave their home because of a fear of falling?  No   Home Environment   Living Environment Private residence   Living Arrangements Alone   Type of [place] Access Level entry   Home Layout Two level;Other (Comment)  lift chair for steps  Prior Function   Level of Independence Independent   Vocation Retired   Leisure none   Cognition   Overall Cognitive Status Within Functional Limits for tasks assessed   Observation/Other Assessments   Focus on Therapeutic Outcomes (FOTO)  40% limitation   Posture/Postural Control   Posture/Postural Control Postural limitations   Postural Limitations Rounded Shoulders;Forward head   ROM / Strength   AROM / PROM / Strength AROM;PROM;Strength   AROM   Overall AROM  Within functional limits for tasks performed   Overall AROM Comments Lumbar AROM is full with pain at end range of each motion.  No pain with AROM of bil. knees.    PROM   Overall PROM  Deficits   Overall PROM Comments bil hip flexiblity limited by 25%    Strength   Overall Strength Deficits   Overall Strength Comments Bil LE strength 4+/5, hip flexors  4/5, ankles 4+/5.     Transfers   Transfers Sit to Stand;Stand to Sit   Sit to Stand With upper extremity assist;6: Modified independent (Device/Increase time)   Ambulation/Gait   Ambulation/Gait Yes   Ambulation Distance (Feet) 100 Feet   Gait Pattern Ataxic   Ambulation Surface Level   Balance   Balance Assessed Yes   Standardized Balance Assessment   Standardized Balance Assessment Five Times Sit to Stand   Five times sit to stand comments  20 seconds   High Level Balance   High Level Balance Activites Other (comment)   High Level Balance Comments single leg stance Rt and Lt 10 seconds each, feet together with eyes open and closed x 10 seconds without instability                           PT Education - 06/30/15 1303    Education provided Yes   Education Details hip standing exercises, sit to stand   Person(s) Educated Patient   Methods Explanation;Demonstration;Handout   Comprehension Verbalized understanding;Returned demonstration          PT Short Term Goals - 06/30/15 1405    PT SHORT TERM GOAL #1   Title be independent in initial HEP   Time 4   Period Weeks   Status New   PT SHORT TERM GOAL #2   Title perform 5x sit to stand in < or = to 15 seconds to reduce falls risk   Time 4   Period Weeks   Status New   PT SHORT TERM GOAL #3   Title report a 25% reduction in LBP with ADLs and self-care   Time 4   Period Weeks   Status New   PT SHORT TERM GOAL #4   Title ``   PT SHORT TERM GOAL #5   Title ``           PT Long Term Goals - 06/30/15 1232    PT LONG TERM GOAL #1   Title be independent in advanced HEP   Time 8   Period Weeks   Status New   PT LONG TERM GOAL #2   Title reduce FOTO to < or = to 34% limitation   Time 8   Period Weeks   Status New   PT LONG TERM GOAL #3   Title perform 5x sit to stand in < or = to 12 seconds to reduce falls risk   Time 8   Period Weeks   Status New   PT LONG TERM GOAL #  4   Title report a  50% reduction in LBP with ADLs and self-care   Time 8   Period Weeks   Status New   PT LONG TERM GOAL #5   Title report no falls x 4 weeks   Time 8   Period Weeks   Status New               Plan - 06/30/15 1355    Clinical Impression Statement Pt presents to PT with complaints of chronic LBP and balance deficits with multiple falls over the past 6 months.  The most recent fall was 05/28/15 and pt sustained injury to Rt knee with meniscus tear.  Pt had cortizone injection into the Rt knee and reports that she is not longer having pain in the knee.  Pt's main concern is instabiltiy with walking and pt demonstrates instability of LEs when walking on level surface.  FOTO score is 40% limitation and 5x sit to stand is 20 seconds.  Pt will benefit from skilled PT to address LE functional weakness, LBP and balance deficits to improve safety at home and in the community.     Rehab Potential Good   PT Frequency 2x / week   PT Duration 8 weeks   PT Treatment/Interventions ADLs/Self Care Home Management;Cryotherapy;Electrical Stimulation;Moist Heat;Therapeutic exercise;Therapeutic activities;Functional mobility training;Stair training;Gait training;Ultrasound;Neuromuscular re-education;Patient/family education;Manual techniques;Passive range of motion;Dry needling   PT Next Visit Plan Gait, balance, practice sit to stand, address LBP, hip flexiblity, core strength   Consulted and Agree with Plan of Care Patient      Patient will benefit from skilled therapeutic intervention in order to improve the following deficits and impairments:  Abnormal gait, Difficulty walking, Pain, Decreased endurance, Decreased strength, Impaired flexibility, Improper body mechanics, Decreased activity tolerance, Decreased balance  Visit Diagnosis: Other abnormalities of gait and mobility - Plan: PT plan of care cert/re-cert  Muscle weakness (generalized) - Plan: PT plan of care cert/re-cert  Bilateral low back pain  without sciatica - Plan: PT plan of care cert/re-cert      G-Codes - [id number]    Functional Assessment Tool Used FOTO: 40% limitation   Functional Limitation Other PT primary   Other PT Primary Current Status [id number]) At least 40 percent but less than 60 percent impaired, limited or restricted   Other PT Primary Goal Status [id number]) At least 20 percent but less than 40 percent impaired, limited or restricted       Problem List Patient Active Problem List   Diagnosis Date Noted  . Lateral meniscus derangement 06/03/2015  . Tear of LCL (lateral collateral ligament) of knee 06/03/2015  . Lumbar radiculopathy 09/19/2014  . Low back pain 01/04/2014  . Bilateral knee pain 01/04/2014  . Diverticulosis of colon without hemorrhage 07/26/2013  . Diarrhea 07/26/2013  . Trigger thumb of left hand 03/06/2013  . Essential hypertension, benign 02/15/2013  . Hyperlipidemia 09/28/2012  . COPD (chronic obstructive pulmonary disease) ([place]) 05/22/2012  . Frequent falls 05/22/2012  . Osteoporosis 05/22/2012     Sigurd Sos, PT 06/30/2015 2:09 PM  [facility] [address] [place], [place], [address] Phone: [phone]   Fax:  [phone]  Name: JOHNIYAH UPTON MRN: [id number] Date of Birth: [birth year]

## 2015-06-30 NOTE — Patient Instructions (Signed)
Knee High   Holding stable object, raise knee to hip level, then lower knee. Repeat with other knee. Complete __10_ repetitions. Do __2__ sessions per day.  ABDUCTION: Standing (Active)   Stand, feet flat. Lift right leg out to side. Use _0__ lbs. Complete __10_ repetitions. Perform __2_ sessions per day.    EXTENSION: Standing (Active)  Stand, both feet flat. Draw right leg behind body as far as possible. Use 0___ lbs. Complete 10 repetitions. Perform __2_ sessions per day.  Copyright  VHI. All rights reserved.   HIP / KNEE: Extension - Sit to Stand    Sitting, lean chest forward, raise hips up from surface. Straighten hips and knees. Weight bear equally on left and right sides. Backs of legs should not push off surface. 10___ reps per set, _3__ sets per day  Copyright  VHI. All rights reserved.  [facility] [address] [place], [place] [address] Phone # [phone] Fax [phone]

## 2015-07-02 ENCOUNTER — Encounter: Payer: Medicare Other | Admitting: Physical Therapy

## 2015-07-03 ENCOUNTER — Encounter: Payer: Self-pay | Admitting: Physical Therapy

## 2015-07-03 ENCOUNTER — Ambulatory Visit: Payer: Medicare Other | Admitting: Physical Therapy

## 2015-07-03 DIAGNOSIS — M6281 Muscle weakness (generalized): Secondary | ICD-10-CM | POA: Diagnosis not present

## 2015-07-03 DIAGNOSIS — M545 Low back pain, unspecified: Secondary | ICD-10-CM

## 2015-07-03 DIAGNOSIS — R2689 Other abnormalities of gait and mobility: Secondary | ICD-10-CM | POA: Diagnosis not present

## 2015-07-03 DIAGNOSIS — R29898 Other symptoms and signs involving the musculoskeletal system: Secondary | ICD-10-CM | POA: Diagnosis not present

## 2015-07-03 NOTE — Therapy (Signed)
[place] [place], [place], [address] Phone: [phone]   Fax:  [phone]  Physical Therapy Treatment  Patient Details  Name: KALENE ABDO MRN: [id number] Date of Birth: [birth year] Referring Provider: Hulan Saas, MD  Encounter Date: 07/03/2015      PT End of Session - 07/03/15 0918    Visit Number 2   Number of Visits 10   Date for PT Re-Evaluation 08/25/15   PT Start Time 0845   PT Stop Time 0940   PT Time Calculation (min) 55 min      Past Medical History  Diagnosis Date  . Hypertension   . Arthritis   . Bursitis     left elbow  . Infectious colitis   . Clostridium difficile infection   . HLD (hyperlipidemia)   . Frequent falls     Past Surgical History  Procedure Laterality Date  . Tonsillectomy    . Orif proximal tibial plateau fracture Left 02/19/2012  . Polypectomy    . Colonoscopy      There were no vitals filed for this visit.      Subjective Assessment - 07/03/15 0841    Subjective knee is great after cortisone injection. LBP is issue with caring for 24 month old granddaughter.    Currently in Pain? Yes   Pain Score 8    Pain Location Back   Pain Orientation Right;Left;Lower                         [facility] Adult PT Treatment/Exercise - 07/03/15 0001    Self-Care   Self-Care Lifting  carrying   Lifting 29 month old   Exercises   Exercises Lumbar;Knee/Hip   Lumbar Exercises: Aerobic   Stationary Bike Nustep L 2 6 min   Lumbar Exercises: Standing   Other Standing Lumbar Exercises yellow tband scap 3 way 10 times each   Other Standing Lumbar Exercises yellow tband hip ext and abd 10 times each   Lumbar Exercises: Seated   Sit to Stand 10 reps  with wt ball 1 set with chest press. 1 set with [facility]   Other Seated Lumbar Exercises sit fit pelvic ROM 15 times each  yellow tabnd LAQ,marching and hip abd 10 times   Modalities   Modalities  Electrical Stimulation;Moist Heat   Moist Heat Therapy   Number Minutes Moist Heat 15 Minutes   Moist Heat Location Lumbar Spine   Electrical Stimulation   Electrical Stimulation Location lumbar   Electrical Stimulation Action IFC   Electrical Stimulation Goals Pain                PT Education - 07/03/15 0906    Education provided Yes   Education Details lifting and carrying granddaughter   Person(s) Educated Patient   Methods Explanation;Demonstration   Comprehension Verbalized understanding;Returned demonstration          PT Short Term Goals - 06/30/15 1405    PT SHORT TERM GOAL #1   Title be independent in initial HEP   Time 4   Period Weeks   Status New   PT SHORT TERM GOAL #2   Title perform 5x sit to stand in < or = to 15 seconds to reduce falls risk   Time 4   Period Weeks   Status New   PT SHORT TERM GOAL #3   Title report a 25% reduction in LBP with ADLs and  self-care   Time 4   Period Weeks   Status New   PT SHORT TERM GOAL #4   Title ``   PT SHORT TERM GOAL #5   Title ``           PT Long Term Goals - 06/30/15 1232    PT LONG TERM GOAL #1   Title be independent in advanced HEP   Time 8   Period Weeks   Status New   PT LONG TERM GOAL #2   Title reduce FOTO to < or = to 34% limitation   Time 8   Period Weeks   Status New   PT LONG TERM GOAL #3   Title perform 5x sit to stand in < or = to 12 seconds to reduce falls risk   Time 8   Period Weeks   Status New   PT LONG TERM GOAL #4   Title report a 50% reduction in LBP with ADLs and self-care   Time 8   Period Weeks   Status New   PT LONG TERM GOAL #5   Title report no falls x 4 weeks   Time 8   Period Weeks   Status New               Plan - 07/03/15 [id number]    Clinical Impression Statement pt with a RT lateral shift and cuing needed for ther ex mechanics.   PT Next Visit Plan balance,LB strength, BM and lifting      Patient will benefit from skilled therapeutic  intervention in order to improve the following deficits and impairments:  Abnormal gait, Difficulty walking, Pain, Decreased endurance, Decreased strength, Impaired flexibility, Improper body mechanics, Decreased activity tolerance, Decreased balance  Visit Diagnosis: Bilateral low back pain without sciatica  Muscle weakness (generalized)     Problem List Patient Active Problem List   Diagnosis Date Noted  . Lateral meniscus derangement 06/03/2015  . Tear of LCL (lateral collateral ligament) of knee 06/03/2015  . Lumbar radiculopathy 09/19/2014  . Low back pain 01/04/2014  . Bilateral knee pain 01/04/2014  . Diverticulosis of colon without hemorrhage 07/26/2013  . Diarrhea 07/26/2013  . Trigger thumb of left hand 03/06/2013  . Essential hypertension, benign 02/15/2013  . Hyperlipidemia 09/28/2012  . COPD (chronic obstructive pulmonary disease) ([place]) 05/22/2012  . Frequent falls 05/22/2012  . Osteoporosis 05/22/2012    PAYSEUR,[name] PTA 07/03/2015, 9:26 AM  [address] [place], [place], [address] Phone: [phone]   Fax:  [phone]  Name: DAANIA DOEHRING MRN: [id number] Date of Birth: 14-Jul-1945

## 2015-07-08 ENCOUNTER — Ambulatory Visit: Payer: Medicare Other | Admitting: Physical Therapy

## 2015-07-08 ENCOUNTER — Encounter: Payer: Medicare Other | Admitting: Physical Therapy

## 2015-07-08 ENCOUNTER — Encounter: Payer: Self-pay | Admitting: Physical Therapy

## 2015-07-08 DIAGNOSIS — M6281 Muscle weakness (generalized): Secondary | ICD-10-CM | POA: Diagnosis not present

## 2015-07-08 DIAGNOSIS — M545 Low back pain, unspecified: Secondary | ICD-10-CM

## 2015-07-08 DIAGNOSIS — R29898 Other symptoms and signs involving the musculoskeletal system: Secondary | ICD-10-CM | POA: Diagnosis not present

## 2015-07-08 DIAGNOSIS — R2689 Other abnormalities of gait and mobility: Secondary | ICD-10-CM | POA: Diagnosis not present

## 2015-07-08 NOTE — Therapy (Signed)
[place], [place], [address] Phone: [phone]   Fax:  [phone]  Physical Therapy Treatment  Patient Details  Name: MARIACLARA RAUDABAUGH MRN: [id number] Date of Birth: April 11, 1945 Referring Provider: Hulan Saas, MD  Encounter Date: 07/08/2015      PT End of Session - 07/08/15 0917    Visit Number 3   Number of Visits 10   PT Start Time 0845   PT Stop Time 0945   PT Time Calculation (min) 60 min      Past Medical History  Diagnosis Date  . Hypertension   . Arthritis   . Bursitis     left elbow  . Infectious colitis   . Clostridium difficile infection   . HLD (hyperlipidemia)   . Frequent falls     Past Surgical History  Procedure Laterality Date  . Tonsillectomy    . Orif proximal tibial plateau fracture Left 02/19/2012  . Polypectomy    . Colonoscopy      There were no vitals filed for this visit.      Subjective Assessment - 07/08/15 0851    Subjective went to gym yesterday and did nustepa nd water aerobics, hurting today   Currently in Pain? Yes   Pain Score 6    Pain Location Back                         [facility] Adult PT Treatment/Exercise - 07/08/15 0001    Ambulation/Gait   Ambulation/Gait Yes   Ambulation Distance (Feet) 600 Feet   Assistive device None   Gait Pattern --  sway/list side to side with fatigue   Ambulation Surface Unlevel;Indoor;Outdoor   Lumbar Exercises: Aerobic   Stationary Bike L 4 5 min   Lumbar Exercises: Machines for Strengthening   Cybex Knee Extension 5# 2 sets 10   Cybex Knee Flexion 20# 2 sets 10   Other Lumbar Machine Exercise lat pull 15 # 2 sets 10   Other Lumbar Machine Exercise seated row 15 # 2 sets 10   Lumbar Exercises: Seated   Other Seated Lumbar Exercises sit fit pelvic ROM 15 times each   Lumbar Exercises: Supine   Ab Set 15 reps   Bridge 15 reps  with ball   Other Supine Lumbar Exercises KTC and  oblique with ball   Modalities   Modalities Electrical Stimulation;Moist Heat   Moist Heat Therapy   Number Minutes Moist Heat 15 Minutes   Moist Heat Location Lumbar Spine   Electrical Stimulation   Electrical Stimulation Location lumbar   Electrical Stimulation Action IFC   Electrical Stimulation Goals Pain                PT Education - 07/08/15 0906    Education provided Yes   Education Details log rolling   Person(s) Educated Patient   Methods Explanation;Demonstration;Verbal cues   Comprehension Verbalized understanding;Returned demonstration          PT Short Term Goals - 07/08/15 0918    PT SHORT TERM GOAL #1   Title be independent in initial HEP   Baseline pool ex   Status Achieved           PT Long Term Goals - 06/30/15 1232    PT LONG TERM GOAL #1   Title be independent in advanced HEP   Time 8   Period Weeks   Status New  PT LONG TERM GOAL #2   Title reduce FOTO to < or = to 34% limitation   Time 8   Period Weeks   Status New   PT LONG TERM GOAL #3   Title perform 5x sit to stand in < or = to 12 seconds to reduce falls risk   Time 8   Period Weeks   Status New   PT LONG TERM GOAL #4   Title report a 50% reduction in LBP with ADLs and self-care   Time 8   Period Weeks   Status New   PT LONG TERM GOAL #5   Title report no falls x 4 weeks   Time 8   Period Weeks   Status New               Plan - 07/08/15 0917    Clinical Impression Statement tolerated therapy well and decreased pain after ther ex compared to when initailly walked in. gait around bldg with listing to side with fatigue left greater than RT, left foot drags inconsistantly but more with fatigue.   PT Next Visit Plan balance,LB strength, BM and lifting      Patient will benefit from skilled therapeutic intervention in order to improve the following deficits and impairments:  Abnormal gait, Difficulty walking, Pain, Decreased endurance, Decreased strength,  Impaired flexibility, Improper body mechanics, Decreased activity tolerance, Decreased balance  Visit Diagnosis: Bilateral low back pain without sciatica  Muscle weakness (generalized)  Left leg weakness     Problem List Patient Active Problem List   Diagnosis Date Noted  . Lateral meniscus derangement 06/03/2015  . Tear of LCL (lateral collateral ligament) of knee 06/03/2015  . Lumbar radiculopathy 09/19/2014  . Low back pain 01/04/2014  . Bilateral knee pain 01/04/2014  . Diverticulosis of colon without hemorrhage 07/26/2013  . Diarrhea 07/26/2013  . Trigger thumb of left hand 03/06/2013  . Essential hypertension, benign 02/15/2013  . Hyperlipidemia 09/28/2012  . COPD (chronic obstructive pulmonary disease) ([place]) 05/22/2012  . Frequent falls 05/22/2012  . Osteoporosis 05/22/2012    Samariah Hokenson,[name] PTA 07/08/2015, 9:21 AM  [address] [place], [place], [address] Phone: [phone]   Fax:  [phone]  Name: WINSOME PROEFROCK MRN: [id number] Date of Birth: 01-25-1946

## 2015-07-10 ENCOUNTER — Ambulatory Visit: Payer: Medicare Other | Admitting: Physical Therapy

## 2015-07-10 ENCOUNTER — Encounter: Payer: Self-pay | Admitting: Physical Therapy

## 2015-07-10 ENCOUNTER — Encounter: Payer: Medicare Other | Admitting: Physical Therapy

## 2015-07-10 DIAGNOSIS — M545 Low back pain, unspecified: Secondary | ICD-10-CM

## 2015-07-10 DIAGNOSIS — M6281 Muscle weakness (generalized): Secondary | ICD-10-CM

## 2015-07-10 DIAGNOSIS — R29898 Other symptoms and signs involving the musculoskeletal system: Secondary | ICD-10-CM | POA: Diagnosis not present

## 2015-07-10 DIAGNOSIS — R2689 Other abnormalities of gait and mobility: Secondary | ICD-10-CM | POA: Diagnosis not present

## 2015-07-10 NOTE — Therapy (Signed)
[place], [place], [address] Phone: [phone]   Fax:  [phone]  Physical Therapy Treatment  Patient Details  Name: Kristina Ayala MRN: [id number] Date of Birth: January 19, 1946 Referring Provider: Hulan Saas, MD  Encounter Date: 07/10/2015      PT End of Session - 07/10/15 0902    Visit Number 4   Number of Visits 10   Date for PT Re-Evaluation 08/25/15   PT Start Time 0845   PT Stop Time 0940   PT Time Calculation (min) 55 min      Past Medical History  Diagnosis Date  . Hypertension   . Arthritis   . Bursitis     left elbow  . Infectious colitis   . Clostridium difficile infection   . HLD (hyperlipidemia)   . Frequent falls     Past Surgical History  Procedure Laterality Date  . Tonsillectomy    . Orif proximal tibial plateau fracture Left 02/19/2012  . Polypectomy    . Colonoscopy      There were no vitals filed for this visit.      Subjective Assessment - 07/10/15 0844    Subjective feeling better, went to water aerobics yesterday   Currently in Pain? Yes   Pain Score 3    Pain Location Back                         [facility] Adult PT Treatment/Exercise - 07/10/15 0001    High Level Balance   High Level Balance Activities --  airex- marching,heel raises,mini squats, eyes closed, ball t   Lumbar Exercises: Aerobic   Stationary Bike L 5 6 min   Lumbar Exercises: Machines for Strengthening   Cybex Knee Extension 5# 2 sets 10   Cybex Knee Flexion 20# 2 sets 10   Other Lumbar Machine Exercise lat pull 20 # 2 sets 10   Other Lumbar Machine Exercise seated row 20 # 2 sets 10   Moist Heat Therapy   Number Minutes Moist Heat 15 Minutes   Moist Heat Location Lumbar Spine   Electrical Stimulation   Electrical Stimulation Location lumbar   Electrical Stimulation Action IFC   Electrical Stimulation Goals Pain                PT Education -  07/10/15 0848    Education provided Yes   Education Details red tband scap stab and hip strengthening   Person(s) Educated Patient   Methods Explanation;Demonstration;Handout   Comprehension Verbalized understanding;Returned demonstration          PT Short Term Goals - 07/10/15 0902    PT SHORT TERM GOAL #1   Title be independent in initial HEP   Status Achieved   PT SHORT TERM GOAL #2   Title perform 5x sit to stand in < or = to 15 seconds to reduce falls risk   Status Achieved   PT SHORT TERM GOAL #3   Title report a 25% reduction in LBP with ADLs and self-care   Status Achieved           PT Long Term Goals - 06/30/15 1232    PT LONG TERM GOAL #1   Title be independent in advanced HEP   Time 8   Period Weeks   Status New   PT LONG TERM GOAL #2   Title reduce FOTO to < or = to 34% limitation  Time 8   Period Weeks   Status New   PT LONG TERM GOAL #3   Title perform 5x sit to stand in < or = to 12 seconds to reduce falls risk   Time 8   Period Weeks   Status New   PT LONG TERM GOAL #4   Title report a 50% reduction in LBP with ADLs and self-care   Time 8   Period Weeks   Status New   PT LONG TERM GOAL #5   Title report no falls x 4 weeks   Time 8   Period Weeks   Status New               Plan - 07/10/15 0903    Clinical Impression Statement issued HEP for days she does not go to gym. decreased pain with increased func. Minimal instaility with dynamic balance on airex. STGs met   PT Next Visit Plan core strength, balance,gait      Patient will benefit from skilled therapeutic intervention in order to improve the following deficits and impairments:  Abnormal gait, Difficulty walking, Pain, Decreased endurance, Decreased strength, Impaired flexibility, Improper body mechanics, Decreased activity tolerance, Decreased balance  Visit Diagnosis: Bilateral low back pain without sciatica  Muscle weakness (generalized)     Problem List Patient  Active Problem List   Diagnosis Date Noted  . Lateral meniscus derangement 06/03/2015  . Tear of LCL (lateral collateral ligament) of knee 06/03/2015  . Lumbar radiculopathy 09/19/2014  . Low back pain 01/04/2014  . Bilateral knee pain 01/04/2014  . Diverticulosis of colon without hemorrhage 07/26/2013  . Diarrhea 07/26/2013  . Trigger thumb of left hand 03/06/2013  . Essential hypertension, benign 02/15/2013  . Hyperlipidemia 09/28/2012  . COPD (chronic obstructive pulmonary disease) ([place]) 05/22/2012  . Frequent falls 05/22/2012  . Osteoporosis 05/22/2012    Jailyn Langhorst,[name] PTA 07/10/2015, 9:04 AM  [address] [place], [place], [address] Phone: [phone]   Fax:  [phone]  Name: Kristina Ayala MRN: [id number] Date of Birth: [birth year]

## 2015-07-15 ENCOUNTER — Encounter: Payer: Self-pay | Admitting: Physical Therapy

## 2015-07-15 ENCOUNTER — Ambulatory Visit: Payer: Medicare Other | Admitting: Physical Therapy

## 2015-07-15 DIAGNOSIS — R2689 Other abnormalities of gait and mobility: Secondary | ICD-10-CM | POA: Diagnosis not present

## 2015-07-15 DIAGNOSIS — M545 Low back pain, unspecified: Secondary | ICD-10-CM

## 2015-07-15 DIAGNOSIS — M6281 Muscle weakness (generalized): Secondary | ICD-10-CM | POA: Diagnosis not present

## 2015-07-15 DIAGNOSIS — R29898 Other symptoms and signs involving the musculoskeletal system: Secondary | ICD-10-CM | POA: Diagnosis not present

## 2015-07-15 NOTE — Therapy (Signed)
[place], [place], [address] Phone: [phone]   Fax:  [phone]  Physical Therapy Treatment  Patient Details  Name: Kristina Ayala MRN: [id number] Date of Birth: 02/06/1945 Referring Provider: Hulan Saas, MD  Encounter Date: 07/15/2015      PT End of Session - 07/15/15 0925    Visit Number 5   Number of Visits 10   Date for PT Re-Evaluation 08/25/15   PT Start Time 0845   PT Stop Time 0930   PT Time Calculation (min) 45 min      Past Medical History  Diagnosis Date  . Hypertension   . Arthritis   . Bursitis     left elbow  . Infectious colitis   . Clostridium difficile infection   . HLD (hyperlipidemia)   . Frequent falls     Past Surgical History  Procedure Laterality Date  . Tonsillectomy    . Orif proximal tibial plateau fracture Left 02/19/2012  . Polypectomy    . Colonoscopy      There were no vitals filed for this visit.      Subjective Assessment - 07/15/15 0848    Subjective feeling so much better,tx really helping   Currently in Pain? No/denies                         [facility] Adult PT Treatment/Exercise - 07/15/15 0001    Lumbar Exercises: Aerobic   Stationary Bike L 5 6 min   Lumbar Exercises: Machines for Strengthening   Cybex Knee Extension 10# 2 sets 10   Cybex Knee Flexion 25# 2 sets 10   Leg Press 30# 3 sets 10   Other Lumbar Machine Exercise lat pull 20 # 2 sets 10   Lumbar Exercises: Standing   Other Standing Lumbar Exercises red tband scap stab 3 way 15 reps each   Lumbar Exercises: Supine   Ab Set 15 reps   Bridge 15 reps  with ball   Straight Leg Raise 10 reps  with abd using red tband   Other Supine Lumbar Exercises obl on ball 20 times   Knee/Hip Exercises: Standing   Hip Abduction Stengthening;Both;1 set;15 reps  10# pulley   Hip Extension Stengthening;Both;1 set;15 reps  10# pulley                   PT Short Term Goals - 07/10/15 0902    PT SHORT TERM GOAL #1   Title be independent in initial HEP   Status Achieved   PT SHORT TERM GOAL #2   Title perform 5x sit to stand in < or = to 15 seconds to reduce falls risk   Status Achieved   PT SHORT TERM GOAL #3   Title report a 25% reduction in LBP with ADLs and self-care   Status Achieved           PT Long Term Goals - 06/30/15 1232    PT LONG TERM GOAL #1   Title be independent in advanced HEP   Time 8   Period Weeks   Status New   PT LONG TERM GOAL #2   Title reduce FOTO to < or = to 34% limitation   Time 8   Period Weeks   Status New   PT LONG TERM GOAL #3   Title perform 5x sit to stand in < or = to 12 seconds to reduce  falls risk   Time 8   Period Weeks   Status New   PT LONG TERM GOAL #4   Title report a 50% reduction in LBP with ADLs and self-care   Time 8   Period Weeks   Status New   PT LONG TERM GOAL #5   Title report no falls x 4 weeks   Time 8   Period Weeks   Status New               Plan - 07/15/15 [id number]    Clinical Impression Statement hip weakness noted with therapy, but toleated increased ther ex and weights well. Trial without modalities as she was fatigued with ther ex but no pain.   PT Next Visit Plan assess tx without modalities. work towards gym progression      Patient will benefit from skilled therapeutic intervention in order to improve the following deficits and impairments:  Abnormal gait, Difficulty walking, Pain, Decreased endurance, Decreased strength, Impaired flexibility, Improper body mechanics, Decreased activity tolerance, Decreased balance  Visit Diagnosis: Bilateral low back pain without sciatica  Muscle weakness (generalized)  Left leg weakness     Problem List Patient Active Problem List   Diagnosis Date Noted  . Lateral meniscus derangement 06/03/2015  . Tear of LCL (lateral collateral ligament) of knee 06/03/2015  . Lumbar radiculopathy 09/19/2014   . Low back pain 01/04/2014  . Bilateral knee pain 01/04/2014  . Diverticulosis of colon without hemorrhage 07/26/2013  . Diarrhea 07/26/2013  . Trigger thumb of left hand 03/06/2013  . Essential hypertension, benign 02/15/2013  . Hyperlipidemia 09/28/2012  . COPD (chronic obstructive pulmonary disease) ([place]) 05/22/2012  . Frequent falls 05/22/2012  . Osteoporosis 05/22/2012    Korine Winton,[name] PTA 07/15/2015, 9:27 AM  [address] [place], [place], [address] Phone: [phone]   Fax:  [phone]  Name: DRAXIE MCDUFFEY MRN: [id number] Date of Birth: 03/28/1945

## 2015-07-17 ENCOUNTER — Encounter: Payer: Self-pay | Admitting: Physical Therapy

## 2015-07-17 ENCOUNTER — Ambulatory Visit: Payer: Medicare Other | Admitting: Physical Therapy

## 2015-07-17 DIAGNOSIS — R2689 Other abnormalities of gait and mobility: Secondary | ICD-10-CM | POA: Diagnosis not present

## 2015-07-17 DIAGNOSIS — R29898 Other symptoms and signs involving the musculoskeletal system: Secondary | ICD-10-CM | POA: Diagnosis not present

## 2015-07-17 DIAGNOSIS — M6281 Muscle weakness (generalized): Secondary | ICD-10-CM | POA: Diagnosis not present

## 2015-07-17 DIAGNOSIS — M545 Low back pain, unspecified: Secondary | ICD-10-CM

## 2015-07-17 NOTE — Therapy (Addendum)
[place], [place], [address] Phone: [phone]   Fax:  [phone]  Physical Therapy Treatment  Patient Details  Name: Kristina Ayala MRN: [id number] Date of Birth: 30-Jul-1945 Referring Provider: Hulan Saas, MD  Encounter Date: 07/17/2015      PT End of Session - 07/17/15 0907    Visit Number 6   Number of Visits 10   Date for PT Re-Evaluation 08/25/15   PT Start Time 0845   PT Stop Time 0937   PT Time Calculation (min) 52 min      Past Medical History  Diagnosis Date  . Hypertension   . Arthritis   . Bursitis     left elbow  . Infectious colitis   . Clostridium difficile infection   . HLD (hyperlipidemia)   . Frequent falls     Past Surgical History  Procedure Laterality Date  . Tonsillectomy    . Orif proximal tibial plateau fracture Left 02/19/2012  . Polypectomy    . Colonoscopy      There were no vitals filed for this visit.      Subjective Assessment - 07/17/15 0847    Subjective really doing well, felt tight after last session but okay. When mt daughter calls me with all her drama I get stressed and back hurts.90% better   Currently in Pain? No/denies                         [facility] Adult PT Treatment/Exercise - 07/17/15 0001    Lumbar Exercises: Aerobic   Stationary Bike L 5 7 min   Lumbar Exercises: Machines for Strengthening   Cybex Lumbar Extension blue tband 2 sets 15  blue tband flexion 2 sets 15   Cybex Knee Extension 10# 2 sets 10   Cybex Knee Flexion 25# 2 sets 10   Leg Press 30# 3 sets 10   Other Lumbar Machine Exercise lat pull 20 # 2 sets 15   Other Lumbar Machine Exercise seated row 20 # 2 sets 15   Knee/Hip Exercises: Standing   Other Standing Knee Exercises 9 inch alt step up 10 and 10 weight squat press                PT Education - 07/17/15 0908    Education provided Yes   Education Details discussed gym  progression and safety   Person(s) Educated Patient   Methods Explanation   Comprehension Verbalized understanding          PT Short Term Goals - 07/10/15 0902    PT SHORT TERM GOAL #1   Title be independent in initial HEP   Status Achieved   PT SHORT TERM GOAL #2   Title perform 5x sit to stand in < or = to 15 seconds to reduce falls risk   Status Achieved   PT SHORT TERM GOAL #3   Title report a 25% reduction in LBP with ADLs and self-care   Status Achieved           PT Long Term Goals - 07/17/15 0843    PT LONG TERM GOAL #1   Title be independent in advanced HEP   Baseline transition to gym   PT [place] #3   Title perform 5x sit to stand in < or = to 12 seconds to reduce falls risk   Status Achieved   PT LONG TERM GOAL #  4   Title report a 50% reduction in LBP with ADLs and self-care   Baseline 90%   Status Achieved   PT LONG TERM GOAL #5   Title report no falls x 4 weeks   Status Achieved        PHYSICAL THERAPY DISCHARGE SUMMARY   Plan: Patient agrees to discharge.  Patient goals were met. Patient is being discharged due to meeting the stated rehab goals.  ?????             Plan - 07/17/15 1610    Clinical Impression Statement Goals met. Hip and core weakness noted but improving and pt has good program in place for gym transition.   PT Next Visit Plan HOLD 2 weeks as pt is doig very well and will tansition into gym.      Patient will benefit from skilled therapeutic intervention in order to improve the following deficits and impairments:  Abnormal gait, Difficulty walking, Pain, Decreased endurance, Decreased strength, Impaired flexibility, Improper body mechanics, Decreased activity tolerance, Decreased balance  Visit Diagnosis: Muscle weakness (generalized)  Bilateral low back pain without sciatica     Problem List Patient Active Problem List   Diagnosis Date Noted  . Lateral meniscus derangement 06/03/2015  . Tear of LCL  (lateral collateral ligament) of knee 06/03/2015  . Lumbar radiculopathy 09/19/2014  . Low back pain 01/04/2014  . Bilateral knee pain 01/04/2014  . Diverticulosis of colon without hemorrhage 07/26/2013  . Diarrhea 07/26/2013  . Trigger thumb of left hand 03/06/2013  . Essential hypertension, benign 02/15/2013  . Hyperlipidemia 09/28/2012  . COPD (chronic obstructive pulmonary disease) ([place]) 05/22/2012  . Frequent falls 05/22/2012  . Osteoporosis 05/22/2012    [name],[name] PTA 07/17/2015, 9:19 AM  [address] [place], [place], [address] Phone: [phone]   Fax:  [phone]  Name: Kristina Ayala MRN: [id number] Date of Birth: January 09, 1946

## 2015-07-18 ENCOUNTER — Emergency Department (HOSPITAL_COMMUNITY)
Admission: EM | Admit: 2015-07-18 | Discharge: 2015-07-18 | Disposition: A | Discharge status: Home / Self Care | Payer: Medicare Other | Attending: Emergency Medicine | Admitting: Emergency Medicine

## 2015-07-18 ENCOUNTER — Encounter (HOSPITAL_COMMUNITY): Payer: Self-pay | Admitting: Emergency Medicine

## 2015-07-18 ENCOUNTER — Emergency Department (HOSPITAL_COMMUNITY): Payer: Medicare Other

## 2015-07-18 DIAGNOSIS — Z87891 Personal history of nicotine dependence: Secondary | ICD-10-CM | POA: Diagnosis not present

## 2015-07-18 DIAGNOSIS — S50311A Abrasion of right elbow, initial encounter: Secondary | ICD-10-CM | POA: Diagnosis not present

## 2015-07-18 DIAGNOSIS — M1990 Unspecified osteoarthritis, unspecified site: Secondary | ICD-10-CM | POA: Insufficient documentation

## 2015-07-18 DIAGNOSIS — M25521 Pain in right elbow: Secondary | ICD-10-CM | POA: Diagnosis present

## 2015-07-18 DIAGNOSIS — E785 Hyperlipidemia, unspecified: Secondary | ICD-10-CM | POA: Diagnosis not present

## 2015-07-18 DIAGNOSIS — Y939 Activity, unspecified: Secondary | ICD-10-CM | POA: Diagnosis not present

## 2015-07-18 DIAGNOSIS — Y999 Unspecified external cause status: Secondary | ICD-10-CM | POA: Insufficient documentation

## 2015-07-18 DIAGNOSIS — Z79899 Other long term (current) drug therapy: Secondary | ICD-10-CM | POA: Insufficient documentation

## 2015-07-18 DIAGNOSIS — S5001XA Contusion of right elbow, initial encounter: Secondary | ICD-10-CM

## 2015-07-18 DIAGNOSIS — W228XXA Striking against or struck by other objects, initial encounter: Secondary | ICD-10-CM | POA: Insufficient documentation

## 2015-07-18 DIAGNOSIS — Z791 Long term (current) use of non-steroidal anti-inflammatories (NSAID): Secondary | ICD-10-CM | POA: Diagnosis not present

## 2015-07-18 DIAGNOSIS — Y929 Unspecified place or not applicable: Secondary | ICD-10-CM | POA: Insufficient documentation

## 2015-07-18 DIAGNOSIS — I10 Essential (primary) hypertension: Secondary | ICD-10-CM | POA: Insufficient documentation

## 2015-07-18 NOTE — ED Provider Notes (Signed)
CSN: [id number]     Arrival date & time 07/18/15  [id number] History   First MD Initiated Contact with Patient 07/18/15 [phone]     Chief Complaint  Patient presents with  . Fall  . Elbow Pain     HPI  Patient presents for evaluation of elbow pain. She felt her chest down to a flexed right elbow struck her olecranon against the pew. Mirtazapine at the tip of her elbow. Can fully flex extend the arm. It is tender to touch.  Past Medical History  Diagnosis Date  . Hypertension   . Arthritis   . Bursitis     left elbow  . Infectious colitis   . Clostridium difficile infection   . HLD (hyperlipidemia)   . Frequent falls    Past Surgical History  Procedure Laterality Date  . Tonsillectomy    . Orif proximal tibial plateau fracture Left 02/19/2012  . Polypectomy    . Colonoscopy     Family History  Problem Relation Age of Onset  . Diverticulosis Mother   . Ovarian cancer Mother   . Pancreatic cancer Father   . Lung cancer Father   . Colon polyps Sister   . Colon cancer Neg Hx    Social History  Substance Use Topics  . Smoking status: Former Smoker -- 0.25 packs/day for 50 years    Types: Cigarettes    Quit date: 08/28/2014  . Smokeless tobacco: Never Used     Comment: 1 pack every 3 days  . Alcohol Use: No   OB History    No data available     Review of Systems  Musculoskeletal:       Pain at the tip of the elbow. No wrist or shoulder pain. No other areas of pain or concern or injury.      Allergies  Doxycycline; Minocycline; Wellbutrin; and Penicillins  Home Medications   Prior to Admission medications   Medication Sig Start Date End Date Taking? Authorizing Provider  acetaminophen (TYLENOL) 500 MG tablet Take 1,000 mg by mouth every 6 (six) hours as needed (For burn pain.).   Yes Historical Provider, MD  amLODipine (NORVASC) 5 MG tablet TAKE 1 TABLET(5 MG) BY MOUTH DAILY 04/30/15  Yes [name], MD  atorvastatin (LIPITOR) 40 MG tablet TAKE 1 TABLET(40 MG) BY  MOUTH EVERY MORNING 04/30/15  Yes [name], MD  calcium-vitamin D (OSCAL WITH D) 500-200 MG-UNIT per tablet Take 1 tablet by mouth daily with breakfast.    Yes Historical Provider, MD  cholecalciferol (VITAMIN D) 1000 UNITS tablet Take 1,000 Units by mouth daily.   Yes Historical Provider, MD  Cyanocobalamin (VITAMIN B-12) 2500 MCG SUBL Place 1 tablet under the tongue daily.   Yes Historical Provider, MD  gabapentin (NEURONTIN) 100 MG capsule Take 1 capsule (100 mg total) by mouth at bedtime. Patient taking differently: Take 100 mg by mouth daily as needed (pain).  06/03/15  Yes Lyndal Pulley, DO  hydrochlorothiazide (HYDRODIURIL) 25 MG tablet TAKE 1 TABLET(25 MG) BY MOUTH EVERY MORNING 04/30/15  Yes [name], MD  ibuprofen (ADVIL,MOTRIN) 200 MG tablet Take 200-400 mg by mouth every 6 (six) hours as needed for moderate pain.   Yes Historical Provider, MD  lisinopril (PRINIVIL,ZESTRIL) 40 MG tablet Take 1 tablet (40 mg total) by mouth every morning. 04/30/15  Yes [name], MD  Magnesium 100 MG CAPS Take 100 mg by mouth daily.   Yes Historical Provider, MD  potassium chloride (  K-DUR) 10 MEQ tablet TAKE 1 TABLET(10 MEQ) BY MOUTH DAILY 04/30/15  Yes [name], MD  TURMERIC PO Take 1 tablet by mouth daily.   Yes Historical Provider, MD  ferrous sulfate 325 (65 FE) MG tablet Take 1 tablet (325 mg total) by mouth daily with breakfast. Patient not taking: Reported on 07/18/2015 03/29/14   Lyndal Pulley, DO  Ibuprofen-Famotidine 800-26.6 MG TABS TAKE 1 TABLET DAILY AS NEEDED. Patient not taking: Reported on 07/18/2015 12/09/14   Lyndal Pulley, DO  meloxicam (MOBIC) 15 MG tablet Take 1 tablet (15 mg total) by mouth daily. Patient not taking: Reported on 07/18/2015 12/17/14   Lyndal Pulley, DO  tiZANidine (ZANAFLEX) 4 MG tablet Take 1 tablet (4 mg total) by mouth Nightly. Patient not taking: Reported on 07/18/2015 12/17/14   Lyndal Pulley, DO   BP 156/88 mmHg  Pulse 92  Temp(Src) 98.4 F  (36.9 C) (Oral)  Resp 18  SpO2 98% Physical Exam  Musculoskeletal:  Tender at the olecranon tip. Extensor mechanism intact. Nontender over the radial head. He can pronate and supinate without difficulty. Full range of motion to flex and extend. No break in the skin. No olecranon bursitis. Normal exam to the wrist and shoulder.   physical exam limited point of injury. She has no other areas of pain or injury.  ED Course  Procedures (including critical care time) Labs Review Labs Reviewed - No data to display  Imaging Review Dg Elbow Complete Right  07/18/2015  CLINICAL DATA:  Status post fall yesterday with a blow to the right elbow. Posterior abrasion elbow pain. Initial encounter. EXAM: RIGHT ELBOW - COMPLETE 3+ VIEW COMPARISON:  None. FINDINGS: There is no evidence of fracture, dislocation, or joint effusion. There is no evidence of arthropathy or other focal bone abnormality. Soft tissues are unremarkable. IMPRESSION: Negative exam. Electronically Signed   By: Inge Rise M.D.   On: 07/18/2015 07:23   I have personally reviewed and evaluated these images and lab results as part of my medical decision-making.   EKG Interpretation None      MDM   Final diagnoses:  Elbow contusion, right, initial encounter    Negative x-rays. No effusion or fracture. Tender right at the tip of the olecranon. Her extensor mechanism is intact. A strep applied to pad and protect the area. Ice, Motrin or Tylenol. Use as tolerated.    Tanna Furry, MD 07/18/15 [phone]

## 2015-07-18 NOTE — ED Notes (Signed)
Pt states she fell at church yesterday when she went to sit down and missed the pugh  Pt states she landed on her right side  Pt states she had a small abrasion to her right elbow so she put a dressing on it, iced it, and took 2 ibuprofen  Pt states this morning the pain is worse  Pt has good ROM to her elbow but states it hurts to lean on

## 2015-07-18 NOTE — Discharge Instructions (Signed)
Ace wrap to pad and protect bruised area. Over-the-counter Motrin or Tylenol for pain. Ice packs intermittently. See below for instructions.  Contusion A contusion is a deep bruise. Contusions are the result of a blunt injury to tissues and muscle fibers under the skin. The injury causes bleeding under the skin. The skin overlying the contusion may turn blue, purple, or yellow. Minor injuries will give you a painless contusion, but more severe contusions may stay painful and swollen for a few weeks.  CAUSES  This condition is usually caused by a blow, trauma, or direct force to an area of the body. SYMPTOMS  Symptoms of this condition include:  Swelling of the injured area.  Pain and tenderness in the injured area.  Discoloration. The area may have redness and then turn blue, purple, or yellow. DIAGNOSIS  This condition is diagnosed based on a physical exam and medical history. An X-ray, CT scan, or MRI may be needed to determine if there are any associated injuries, such as broken bones (fractures). TREATMENT  Specific treatment for this condition depends on what area of the body was injured. In general, the best treatment for a contusion is resting, icing, applying pressure to (compression), and elevating the injured area. This is often called the RICE strategy. Over-the-counter anti-inflammatory medicines may also be recommended for pain control.  HOME CARE INSTRUCTIONS   Rest the injured area.  If directed, apply ice to the injured area:  Put ice in a plastic bag.  Place a towel between your skin and the bag.  Leave the ice on for 20 minutes, 2-3 times per day.  If directed, apply light compression to the injured area using an elastic bandage. Make sure the bandage is not wrapped too tightly. Remove and reapply the bandage as directed by your health care provider.  If possible, raise (elevate) the injured area above the level of your heart while you are sitting or lying  down.  Take over-the-counter and prescription medicines only as told by your health care provider. SEEK MEDICAL CARE IF:  Your symptoms do not improve after several days of treatment.  Your symptoms get worse.  You have difficulty moving the injured area. SEEK IMMEDIATE MEDICAL CARE IF:   You have severe pain.  You have numbness in a hand or foot.  Your hand or foot turns pale or cold.   This information is not intended to replace advice given to you by your health care provider. Make sure you discuss any questions you have with your health care provider.   Document Released: 10/21/2004 Document Revised: 10/02/2014 Document Reviewed: 05/29/2014 Elsevier Interactive Patient Education 2016 [place].  Cryotherapy Cryotherapy means treatment with cold. Ice or gel packs can be used to reduce both pain and swelling. Ice is the most helpful within the first 24 to 48 hours after an injury or flare-up from overusing a muscle or joint. Sprains, strains, spasms, burning pain, shooting pain, and aches can all be eased with ice. Ice can also be used when recovering from surgery. Ice is effective, has very few side effects, and is safe for most people to use. PRECAUTIONS  Ice is not a safe treatment option for people with:  Raynaud phenomenon. This is a condition affecting small blood vessels in the extremities. Exposure to cold may cause your problems to return.  Cold hypersensitivity. There are many forms of cold hypersensitivity, including:  Cold urticaria. Red, itchy hives appear on the skin when the tissues begin to warm after  being iced.  Cold erythema. This is a red, itchy rash caused by exposure to cold.  Cold hemoglobinuria. Red blood cells break down when the tissues begin to warm after being iced. The hemoglobin that carry oxygen are passed into the urine because they cannot combine with blood proteins fast enough.  Numbness or altered sensitivity in the area being iced. If  you have any of the following conditions, do not use ice until you have discussed cryotherapy with your caregiver:  Heart conditions, such as arrhythmia, angina, or chronic heart disease.  High blood pressure.  Healing wounds or open skin in the area being iced.  Current infections.  Rheumatoid arthritis.  Poor circulation.  Diabetes. Ice slows the blood flow in the region it is applied. This is beneficial when trying to stop inflamed tissues from spreading irritating chemicals to surrounding tissues. However, if you expose your skin to cold temperatures for too long or without the proper protection, you can damage your skin or nerves. Watch for signs of skin damage due to cold. HOME CARE INSTRUCTIONS Follow these tips to use ice and cold packs safely.  Place a dry or damp towel between the ice and skin. A damp towel will cool the skin more quickly, so you may need to shorten the time that the ice is used.  For a more rapid response, add gentle compression to the ice.  Ice for no more than 10 to 20 minutes at a time. The bonier the area you are icing, the less time it will take to get the benefits of ice.  Check your skin after 5 minutes to make sure there are no signs of a poor response to cold or skin damage.  Rest 20 minutes or more between uses.  Once your skin is numb, you can end your treatment. You can test numbness by very lightly touching your skin. The touch should be so light that you do not see the skin dimple from the pressure of your fingertip. When using ice, most people will feel these normal sensations in this order: cold, burning, aching, and numbness.  Do not use ice on someone who cannot communicate their responses to pain, such as small children or people with dementia. HOW TO MAKE AN ICE PACK Ice packs are the most common way to use ice therapy. Other methods include ice massage, ice baths, and cryosprays. Muscle creams that cause a cold, tingly feeling do not  offer the same benefits that ice offers and should not be used as a substitute unless recommended by your caregiver. To make an ice pack, do one of the following:  Place crushed ice or a bag of frozen vegetables in a sealable plastic bag. Squeeze out the excess air. Place this bag inside another plastic bag. Slide the bag into a pillowcase or place a damp towel between your skin and the bag.  Mix 3 parts water with 1 part rubbing alcohol. Freeze the mixture in a sealable plastic bag. When you remove the mixture from the freezer, it will be slushy. Squeeze out the excess air. Place this bag inside another plastic bag. Slide the bag into a pillowcase or place a damp towel between your skin and the bag. SEEK MEDICAL CARE IF:  You develop white spots on your skin. This may give the skin a blotchy (mottled) appearance.  Your skin turns blue or pale.  Your skin becomes waxy or hard.  Your swelling gets worse. MAKE SURE YOU:   Understand these  these instructions. °· Will watch your condition. °· Will get help right away if you are not doing well or get worse. °  °This information is not intended to replace advice given to you by your health care provider. Make sure you discuss any questions you have with your health care provider. °  °Document Released: 09/07/2010 Document Revised: 02/01/2014 Document Reviewed: 09/07/2010 °Elsevier Interactive Patient Education ©2016 [employer] Inc[employer] ° °

## 2015-08-04 ENCOUNTER — Ambulatory Visit (INDEPENDENT_AMBULATORY_CARE_PROVIDER_SITE_OTHER): Payer: Medicare Other | Admitting: Family Medicine

## 2015-08-04 ENCOUNTER — Encounter: Payer: Self-pay | Admitting: Family Medicine

## 2015-08-04 VITALS — BP 132/82 | HR 82 | Wt 168.0 lb

## 2015-08-04 DIAGNOSIS — M23303 Other meniscus derangements, unspecified medial meniscus, right knee: Secondary | ICD-10-CM | POA: Diagnosis not present

## 2015-08-04 DIAGNOSIS — M5416 Radiculopathy, lumbar region: Secondary | ICD-10-CM | POA: Diagnosis not present

## 2015-08-04 DIAGNOSIS — M23306 Other meniscus derangements, unspecified meniscus, right knee: Secondary | ICD-10-CM | POA: Diagnosis not present

## 2015-08-04 DIAGNOSIS — M23300 Other meniscus derangements, unspecified lateral meniscus, right knee: Secondary | ICD-10-CM

## 2015-08-04 NOTE — Patient Instructions (Signed)
Great to see you  Kristina Ayala is your friend when needed A ball between shoulder blades with sitting On wall with heels, butt shoulder and head touching for a goal of 5 minutes daily  Otherwise keep dong what you are doing I am proud of you See me when you need me.

## 2015-08-04 NOTE — Assessment & Plan Note (Signed)
No longer having any radicular symptoms. Asian is doing well overall. Patient will continue with the core strengthening exercises. We discussed posture and ergonomics. Patient come back and see me again on an as-needed basis. Finish with formal physical therapy.

## 2015-08-04 NOTE — Assessment & Plan Note (Signed)
Seems to be doing well. No change in management.

## 2015-08-04 NOTE — Progress Notes (Signed)
[facility] [address] [place], [place] [address] Phone: [phone] Subjective:     CC: Right knee pain Follow-up  [id number] Kristina Ayala is a 70 y.o. female coming in with complaint of right knee pain.patient is distally significant better. No tenderness in all of the knee. Patient is able to do daily activities and standing for long amount of time without any pain. No trouble with twisting motions or going upstairs.   patient also has chronic back pain. Did have x-rays at last exam that were independently visualized by me. X-rays the patient's spine show moderate spondylosis and degenerative disc disease. Patient is finished with formal physical therapy and thinks it is been beneficial. Doing water aerobics, yoga and Pilates. Not having any significant pain. Feels like she has made progress and states that she is 60-70% better..  Past Medical History  Diagnosis Date  . Hypertension   . Arthritis   . Bursitis     left elbow  . Infectious colitis   . Clostridium difficile infection   . HLD (hyperlipidemia)   . Frequent falls    Past Surgical History  Procedure Laterality Date  . Tonsillectomy    . Orif proximal tibial plateau fracture Left 02/19/2012  . Polypectomy    . Colonoscopy     Social History   Social History  . Marital Status: Divorced    Spouse Name: N/A  . Number of Children: 2  . Years of Education: N/A   Occupational History  . RETIRED    Social History Main Topics  . Smoking status: Former Smoker -- 0.25 packs/day for 50 years    Types: Cigarettes    Quit date: 08/28/2014  . Smokeless tobacco: Never Used     Comment: 1 pack every 3 days  . Alcohol Use: No  . Drug Use: No  . Sexual Activity: Not Asked   Other Topics Concern  . None   Social History Narrative   Grandson (19 y/o) lives with patient.     Son lives in [place]   Daughter lives in [place]   She is originally from [place], [place]   Divorced             Allergies  Allergen Reactions  . Doxycycline Diarrhea  . Minocycline Diarrhea  . Wellbutrin [Bupropion] Swelling    Per pt her tongue was swollen and sore/symptoms stopped once the medication was discontinued.   [name] Penicillins Rash    Has patient had a PCN reaction causing immediate rash, facial/tongue/throat swelling, SOB or lightheadedness with hypotension: yes Has patient had a PCN reaction causing severe rash involving mucus membranes or skin necrosis: no Has patient had a PCN reaction that required hospitalization: unknown Has patient had a PCN reaction occurring within the last 10 years: no If all of the above answers are "NO", then may proceed with Cephalosporin use.    Family History  Problem Relation Age of Onset  . Diverticulosis Mother   . Ovarian cancer Mother   . Pancreatic cancer Father   . Lung cancer Father   . Colon polyps Sister   . Colon cancer Neg Hx     Past medical history, social, surgical and family history all reviewed in electronic medical record.  No pertanent information unless stated regarding to the chief complaint.   Review of Systems: No headache, visual changes, nausea, vomiting, diarrhea, constipation, dizziness, abdominal pain, skin rash, fevers, chills, night sweats, weight loss, swollen lymph nodes, body aches,  joint swelling, muscle aches, chest pain, shortness of breath, mood changes.   Objective Blood pressure 132/82, pulse 82, weight 168 lb (76.204 kg).  General: No apparent distress alert and oriented x3 mood and affect normal, dressed appropriately.  HEENT: Pupils equal, extraocular movements intact  Respiratory: Patient's speak in full sentences and does not appear short of breath  Cardiovascular: No lower extremity edema, non tender, no erythema  Skin: Warm dry intact with no signs of infection or rash on extremities or on axial skeleton.  Abdomen: Soft nontender  Neuro: Cranial nerves II through XII are intact,  neurovascularly intact in all extremities with 2+ DTRs and 2+ pulses.  Lymph: No lymphadenopathy of posterior or anterior cervical chain or axillae bilaterally.  Gait normal with good balance and coordination.  MSK:  Non tender with full range of motion and good stability and symmetric strength and tone of shoulders, elbows, wrist, hip, and ankles bilaterally. Arthritic changes of multiple joints Back exam shows the patient has only forward flexion of 35 in extension of 10. Mild improvement in range of motion. Mild kyphosis noted. Knee: Right No effusion nontender ROM full in flexion and extension and lower leg rotation. Patient does have improvement with endpoint of the LCL Negative McMurray's nontender painful compression Patellar glide with mild crepitus. Patellar and quadriceps tendons unremarkable. Hamstring and quadriceps strength is normal.  Contralateral knee has mild crepitus but no pain    Impression and Recommendations:     This case required medical decision making of moderate complexity.      Note: This dictation was prepared with Dragon dictation along with smaller phrase technology. Any transcriptional errors that result from this process are unintentional.

## 2015-08-20 DIAGNOSIS — L84 Corns and callosities: Secondary | ICD-10-CM | POA: Diagnosis not present

## 2015-08-20 DIAGNOSIS — L602 Onychogryphosis: Secondary | ICD-10-CM | POA: Diagnosis not present

## 2015-09-03 ENCOUNTER — Telehealth: Payer: Self-pay

## 2015-09-03 NOTE — Progress Notes (Signed)
[name] [address] [place], [place] [address] Phone: [phone] Subjective:    I'm seeing this patient by the request  of:  Drema Pry, DO   CC: neck pain  [id number]  Kristina Ayala is a 70 y.o. female coming in with complaint of neck pain patient states this seems to be more on the right shoulder. States that the pain as a dull throbbing aching sensation that has some mild radiation down the arm. Patient denies any significant numbness. Patient states that it is just sore overall. Seems to be when she picks up her grandchild gives her trouble. Denies any numbness.    Previous neck MRI and did 08/02/2012 showed mild osteophytic changes with a small central protrusion at C3-4, C4-5 without central canal stenosis Past Medical History:  Diagnosis Date  . Arthritis   . Bursitis    left elbow  . Clostridium difficile infection   . Frequent falls   . HLD (hyperlipidemia)   . Hypertension   . Infectious colitis    Past Surgical History:  Procedure Laterality Date  . COLONOSCOPY    . ORIF PROXIMAL TIBIAL PLATEAU FRACTURE Left 02/19/2012  . POLYPECTOMY    . TONSILLECTOMY     Social History   Social History  . Marital status: Divorced    Spouse name: N/A  . Number of children: 2  . Years of education: N/A   Occupational History  . RETIRED    Social History Main Topics  . Smoking status: Former Smoker    Packs/day: 0.25    Years: 50.00    Types: Cigarettes    Quit date: 08/28/2014  . Smokeless tobacco: Never Used     Comment: 1 pack every 3 days  . Alcohol use No  . Drug use: No  . Sexual activity: Not Asked   Other Topics Concern  . None   Social History Narrative   Grandson (34 y/o) lives with patient.     Son lives in [place]   Daughter lives in [place]   She is originally from [place], [place]   Divorced            Allergies  Allergen Reactions  . Doxycycline Diarrhea  . Minocycline Diarrhea  . Wellbutrin  [Bupropion] Swelling    Per pt her tongue was swollen and sore/symptoms stopped once the medication was discontinued.   [name] Penicillins Rash    Has patient had a PCN reaction causing immediate rash, facial/tongue/throat swelling, SOB or lightheadedness with hypotension: yes Has patient had a PCN reaction causing severe rash involving mucus membranes or skin necrosis: no Has patient had a PCN reaction that required hospitalization: unknown Has patient had a PCN reaction occurring within the last 10 years: no If all of the above answers are "NO", then may proceed with Cephalosporin use.    Family History  Problem Relation Age of Onset  . Diverticulosis Mother   . Ovarian cancer Mother   . Pancreatic cancer Father   . Lung cancer Father   . Colon polyps Sister   . Colon cancer Neg Hx     Past medical history, social, surgical and family history all reviewed in electronic medical record.  No pertanent information unless stated regarding to the chief complaint.   Review of Systems: No headache, visual changes, nausea, vomiting, diarrhea, constipation, dizziness, abdominal pain, skin rash, fevers, chills, night sweats, weight loss, swollen lymph nodes, body aches, joint swelling, muscle aches, chest pain, shortness of  breath, mood changes.   Objective  Blood pressure 134/82, pulse 92, weight 169 lb (76.7 kg), SpO2 97 %.  General: No apparent distress alert and oriented x3 mood and affect normal, dressed appropriately.  HEENT: Pupils equal, extraocular movements intact  Respiratory: Patient's speak in full sentences and does not appear short of breath  Cardiovascular: No lower extremity edema, non tender, no erythema  Skin: Warm dry intact with no signs of infection or rash on extremities or on axial skeleton.  Abdomen: Soft nontender  Neuro: Cranial nerves II through XII are intact, neurovascularly intact in all extremities with 2+ DTRs and 2+ pulses.  Lymph: No lymphadenopathy of posterior  or anterior cervical chain or axillae bilaterally.  Gait normal with good balance and coordination.  MSK:  Non tender with full range of motion and good stability and symmetric strength and tone of , elbows, wrist, hip, knee and ankles bilaterally.  Neck: Inspection unremarkable. No palpable stepoffs. Negative Spurling's maneuver. Full neck range of motion Grip strength and sensation normal in bilateral hands Strength good C4 to T1 distribution No sensory change to C4 to T1 Negative Hoffman sign bilaterally Reflexes normal  Shoulder: Right Inspection reveals no abnormalities, atrophy or asymmetry. Palpation is normal with no tenderness over AC joint or bicipital groove. ROM is full in all planes passively. Rotator cuff strength normal throughout. signs of impingement with positive Neer and Hawkin's tests, but negative empty can sign. Speeds and Yergason's tests normal. No labral pathology noted with negative Obrien's, negative clunk and good stability. Normal scapular function observed. No painful arc and no drop arm sign. No apprehension sign  MSK US performed of: Right This study was ordered, performed, and interpreted by Charlann Boxer D.O.  Shoulder:   Supraspinatus:  Appears normal on long and transverse views, Bursal bulge seen with shoulder abduction on impingement view. Infraspinatus:  Appears normal on long and transverse views. Significant increase in Doppler flow Subscapularis:  Appears normal on long and transverse views. Positive bursa Teres Minor:  Appears normal on long and transverse views. AC joint:  Capsule undistended, no geyser sign. Glenohumeral Joint:  Appears normal without effusion. Glenoid Labrum:  Intact without visualized tears. Biceps Tendon:  Appears normal on long and transverse views, no fraying of tendon, tendon located in intertubercular groove, no subluxation with shoulder internal or external rotation.  Impression: Subacromial  bursitis  Procedure: Real-time Ultrasound Guided Injection of right glenohumeral joint Device: GE Logiq E  Ultrasound guided injection is preferred based studies that show increased duration, increased effect, greater accuracy, decreased procedural pain, increased response rate with ultrasound guided versus blind injection.  Verbal informed consent obtained.  Time-out conducted.  Noted no overlying erythema, induration, or other signs of local infection.  Skin prepped in a sterile fashion.  Local anesthesia: Topical Ethyl chloride.  With sterile technique and under real time ultrasound guidance:  Joint visualized.  23g 1  inch needle inserted posterior approach. Pictures taken for needle placement. Patient did have injection of 2 cc of 1% lidocaine, 2 cc of 0.5% Marcaine, and 1.0 cc of Kenalog 40 mg/dL. Completed without difficulty  Pain immediately resolved suggesting accurate placement of the medication.  Advised to call if fevers/chills, erythema, induration, drainage, or persistent bleeding.  Images permanently stored and available for review in the ultrasound unit.  Impression: Technically successful ultrasound guided injection.    Impression and Recommendations:     This case required medical decision making of moderate complexity.      Note:  This dictation was prepared with Dragon dictation along with smaller phrase technology. Any transcriptional errors that result from this process are unintentional.

## 2015-09-03 NOTE — Telephone Encounter (Signed)
Patient called and said her R shoulder is having a lot of pain. She believe she could have pulled something. She made and app for tomorrow at 9am. But still wanted me to send you a message and see if there is anything you could tell her to do so she didn't have to come in. As I informed her it would be hard to advise without seeing it.

## 2015-09-03 NOTE — Telephone Encounter (Signed)
With all her problems we should probably see her.

## 2015-09-04 ENCOUNTER — Encounter: Payer: Self-pay | Admitting: Family Medicine

## 2015-09-04 ENCOUNTER — Other Ambulatory Visit: Payer: Self-pay

## 2015-09-04 ENCOUNTER — Ambulatory Visit (INDEPENDENT_AMBULATORY_CARE_PROVIDER_SITE_OTHER): Payer: Medicare Other | Admitting: Family Medicine

## 2015-09-04 VITALS — BP 134/82 | HR 92 | Wt 169.0 lb

## 2015-09-04 DIAGNOSIS — M25511 Pain in right shoulder: Secondary | ICD-10-CM | POA: Diagnosis not present

## 2015-09-04 DIAGNOSIS — M7551 Bursitis of right shoulder: Secondary | ICD-10-CM | POA: Insufficient documentation

## 2015-09-04 NOTE — Assessment & Plan Note (Signed)
Patient given injection today and tolerated the procedure well. We discussed icing regimen. We discussed which activities to do an which was potentially avoid. Patient will follow-up with me again in 4 weeks. Has different medications including gabapentin, meloxicam and muscle relaxers for breakthrough pain. Worsening symptoms we'll consider formal physical therapy.

## 2015-09-04 NOTE — Patient Instructions (Addendum)
God to see you  Kristina Ayala is your friend  Ice 20 minutes 2 times daily. Usually after activity and before bed.  Exercises 3 times a week.  Avoid any activity with your hand outside your peripheral vision.  Continue the medications and you can take gabapentin 200mg  at night See me again in 3-6 weeks if not perfect and we may need to look at your neck.

## 2015-09-07 NOTE — Patient Instructions (Addendum)
  Medications reviewed and updated.  No changes recommended at this time.     Please followup in 6 months   

## 2015-09-07 NOTE — Progress Notes (Signed)
Subjective:    Patient ID: Kristina Ayala, female    DOB: 07/13/1945, 70 y.o.   MRN: [id number]  HPI She is here for follow up.  Since she was here last she has had two falls.  She has done PT.  She does water aerobics.  She has poor balance and relates that to her neuropathy and chronic foot pain.  Hypertension: She is taking her medication daily. She is compliant with a low sodium diet.  She denies chest pain, palpitations, edema, shortness of breath and regular headaches. She is exercising regularly.  She does not monitor her blood pressure at home.    Hyperlipidemia: She is taking her medication daily. She is compliant with a low fat/cholesterol diet. She is exercising regularly. She denies myalgias.   Hammer toes, calluses, poor balance:  She does follow with a podiatrist. She was told that she should have prescription shoes and a podiatrist wanted me to write a letter in regards to this as well. She has chronic pain and is concerned about falling.  Chronic back pain:  She follows with Dr Tamala Julian.    Medications and allergies reviewed with patient and updated if appropriate.  Patient Active Problem List   Diagnosis Date Noted  . Bursitis of right shoulder 09/04/2015  . Lateral meniscus derangement 06/03/2015  . Tear of LCL (lateral collateral ligament) of knee 06/03/2015  . Lumbar radiculopathy 09/19/2014  . Low back pain 01/04/2014  . Bilateral knee pain 01/04/2014  . Diverticulosis of colon without hemorrhage 07/26/2013  . Diarrhea 07/26/2013  . Trigger thumb of left hand 03/06/2013  . Essential hypertension, benign 02/15/2013  . Hyperlipidemia 09/28/2012  . COPD (chronic obstructive pulmonary disease) ([place]) 05/22/2012  . Frequent falls 05/22/2012  . Osteoporosis 05/22/2012    Current Outpatient Prescriptions on File Prior to Visit  Medication Sig Dispense Refill  . acetaminophen (TYLENOL) 500 MG tablet Take 1,000 mg by mouth every 6 (six) hours as needed (For  burn pain.).    [name] amLODipine (NORVASC) 5 MG tablet TAKE 1 TABLET(5 MG) BY MOUTH DAILY 90 tablet 3  . atorvastatin (LIPITOR) 40 MG tablet TAKE 1 TABLET(40 MG) BY MOUTH EVERY MORNING 90 tablet 3  . calcium-vitamin D (OSCAL WITH D) 500-200 MG-UNIT per tablet Take 1 tablet by mouth daily with breakfast.     . cholecalciferol (VITAMIN D) 1000 UNITS tablet Take 1,000 Units by mouth daily.    . Cyanocobalamin (VITAMIN B-12) 2500 MCG SUBL Place 1 tablet under the tongue daily.    . ferrous sulfate 325 (65 FE) MG tablet Take 1 tablet (325 mg total) by mouth daily with breakfast. 30 tablet 3  . gabapentin (NEURONTIN) 100 MG capsule Take 1 capsule (100 mg total) by mouth at bedtime. (Patient taking differently: Take 100 mg by mouth daily as needed (pain). ) 90 capsule 3  . hydrochlorothiazide (HYDRODIURIL) 25 MG tablet TAKE 1 TABLET(25 MG) BY MOUTH EVERY MORNING 90 tablet 3  . ibuprofen (ADVIL,MOTRIN) 200 MG tablet Take 200-400 mg by mouth every 6 (six) hours as needed for moderate pain.    . Ibuprofen-Famotidine 800-26.6 MG TABS TAKE 1 TABLET DAILY AS NEEDED. 270 tablet 3  . lisinopril (PRINIVIL,ZESTRIL) 40 MG tablet Take 1 tablet (40 mg total) by mouth every morning. 90 tablet 3  . Magnesium 100 MG CAPS Take 100 mg by mouth daily.    . meloxicam (MOBIC) 15 MG tablet Take 1 tablet (15 mg total) by mouth daily. 30 tablet 0  .  potassium chloride (K-DUR) 10 MEQ tablet TAKE 1 TABLET(10 MEQ) BY MOUTH DAILY 90 tablet 3  . tiZANidine (ZANAFLEX) 4 MG tablet Take 1 tablet (4 mg total) by mouth Nightly. 30 tablet 2  . TURMERIC PO Take 1 tablet by mouth daily.     No current facility-administered medications on file prior to visit.     Past Medical History:  Diagnosis Date  . Arthritis   . Bursitis    left elbow  . Clostridium difficile infection   . Frequent falls   . HLD (hyperlipidemia)   . Hypertension   . Infectious colitis     Past Surgical History:  Procedure Laterality Date  . COLONOSCOPY    .  ORIF PROXIMAL TIBIAL PLATEAU FRACTURE Left 02/19/2012  . POLYPECTOMY    . TONSILLECTOMY      Social History   Social History  . Marital status: Divorced    Spouse name: N/A  . Number of children: 2  . Years of education: N/A   Occupational History  . RETIRED    Social History Main Topics  . Smoking status: Former Smoker    Packs/day: 0.25    Years: 50.00    Types: Cigarettes    Quit date: 08/28/2014  . Smokeless tobacco: Never Used     Comment: 1 pack every 3 days  . Alcohol use No  . Drug use: No  . Sexual activity: Not on file   Other Topics Concern  . Not on file   Social History Narrative   Grandson (69 y/o) lives with patient.     Son lives in [place]   Daughter lives in [place]   She is originally from [place], [place]   Divorced             Family History  Problem Relation Age of Onset  . Diverticulosis Mother   . Ovarian cancer Mother   . Pancreatic cancer Father   . Lung cancer Father   . Colon polyps Sister   . Colon cancer Neg Hx     Review of Systems  Constitutional: Negative for fever.  Respiratory: Negative for cough, shortness of breath and wheezing.   Cardiovascular: Negative for chest pain, palpitations and leg swelling.  Neurological: Negative for light-headedness, numbness and headaches.       Objective:   Vitals:   09/08/15 0912  BP: 134/86  Pulse: 82  Resp: 16  Temp: 98.6 F (37 C)   Filed Weights   09/08/15 0912  Weight: 170 lb (77.1 kg)   Body mass index is 28.29 kg/m.   Physical Exam Constitutional: Appears well-developed and well-nourished. No distress.  HENT:  Head: Normocephalic and atraumatic.  Neck: Neck supple. No tracheal deviation present. No thyromegaly present.  Cardiovascular: Normal rate, regular rhythm and normal heart sounds.   2/6 sys murmur heard. No carotid bruit  Pulmonary/Chest: Effort normal and breath sounds normal. No respiratory distress. No has no wheezes. No rales.  Musculoskeletal: No  edema.  Lymphadenopathy: No cervical adenopathy.  Feet: b/l feet inspected - no ulcers, b/l calluses on fifth and first toes, b/l mild bunions, mild hammer toes b/l, normal sensation, normal pulses.  Skin: Skin is warm and dry. Not diaphoretic.  Psychiatric: Normal mood and affect. Behavior is normal.       Assessment & Plan:   See Problem List for Assessment and Plan of chronic medical problems.  Follow-up in 6 months for a wellness/physical

## 2015-09-08 ENCOUNTER — Ambulatory Visit (INDEPENDENT_AMBULATORY_CARE_PROVIDER_SITE_OTHER): Payer: Medicare Other | Admitting: Internal Medicine

## 2015-09-08 ENCOUNTER — Encounter: Payer: Self-pay | Admitting: Internal Medicine

## 2015-09-08 VITALS — BP 134/86 | HR 82 | Temp 98.6°F | Resp 16 | Ht 65.0 in | Wt 170.0 lb

## 2015-09-08 DIAGNOSIS — E785 Hyperlipidemia, unspecified: Secondary | ICD-10-CM

## 2015-09-08 DIAGNOSIS — R296 Repeated falls: Secondary | ICD-10-CM

## 2015-09-08 DIAGNOSIS — I10 Essential (primary) hypertension: Secondary | ICD-10-CM

## 2015-09-08 NOTE — Assessment & Plan Note (Signed)
Lipid panel controlled in the past Continue atorvastatin Check lipid panel at next visit

## 2015-09-08 NOTE — Assessment & Plan Note (Signed)
BP well controlled Current regimen effective and well tolerated Continue current medications at current doses  

## 2015-09-08 NOTE — Assessment & Plan Note (Addendum)
Has completed physical therapy Some of her balance issues may be secondary to her chronic foot pain, hammer toes calluses in addition to her chronic back pain and knee pain Stressed regular exercise and physical therapy exercises at home She would benefit from good shoes Currently using a single pronged cane-consider for pronged cane or walker

## 2015-09-08 NOTE — Progress Notes (Signed)
Pre visit review using our clinic review tool, if applicable. No additional management support is needed unless otherwise documented below in the visit note. 

## 2015-10-04 ENCOUNTER — Encounter (HOSPITAL_COMMUNITY): Payer: Self-pay

## 2015-10-04 ENCOUNTER — Emergency Department (HOSPITAL_COMMUNITY)
Admission: EM | Admit: 2015-10-04 | Discharge: 2015-10-04 | Disposition: A | Discharge status: Home / Self Care | Payer: Medicare Other | Attending: Emergency Medicine | Admitting: Emergency Medicine

## 2015-10-04 DIAGNOSIS — Z79899 Other long term (current) drug therapy: Secondary | ICD-10-CM | POA: Diagnosis not present

## 2015-10-04 DIAGNOSIS — R109 Unspecified abdominal pain: Secondary | ICD-10-CM | POA: Insufficient documentation

## 2015-10-04 DIAGNOSIS — R112 Nausea with vomiting, unspecified: Secondary | ICD-10-CM | POA: Insufficient documentation

## 2015-10-04 DIAGNOSIS — Z87891 Personal history of nicotine dependence: Secondary | ICD-10-CM | POA: Insufficient documentation

## 2015-10-04 DIAGNOSIS — R1110 Vomiting, unspecified: Secondary | ICD-10-CM

## 2015-10-04 DIAGNOSIS — Z791 Long term (current) use of non-steroidal anti-inflammatories (NSAID): Secondary | ICD-10-CM | POA: Insufficient documentation

## 2015-10-04 DIAGNOSIS — I10 Essential (primary) hypertension: Secondary | ICD-10-CM | POA: Diagnosis not present

## 2015-10-04 DIAGNOSIS — J449 Chronic obstructive pulmonary disease, unspecified: Secondary | ICD-10-CM | POA: Insufficient documentation

## 2015-10-04 DIAGNOSIS — R197 Diarrhea, unspecified: Secondary | ICD-10-CM | POA: Insufficient documentation

## 2015-10-04 LAB — COMPREHENSIVE METABOLIC PANEL
ALK PHOS: 67 U/L (ref 38–126)
ALT: 45 U/L (ref 14–54)
AST: 33 U/L (ref 15–41)
Albumin: 4.3 g/dL (ref 3.5–5.0)
Anion gap: 11 (ref 5–15)
BILIRUBIN TOTAL: 0.4 mg/dL (ref 0.3–1.2)
BUN: 14 mg/dL (ref 6–20)
CALCIUM: 9.3 mg/dL (ref 8.9–10.3)
CO2: 25 mmol/L (ref 22–32)
CREATININE: 0.96 mg/dL (ref 0.44–1.00)
Chloride: 104 mmol/L (ref 101–111)
GFR, EST NON AFRICAN AMERICAN: 58 mL/min — AB (ref >60)
Glucose, Bld: 120 mg/dL — ABNORMAL HIGH (ref 65–99)
Potassium: 3.3 mmol/L — ABNORMAL LOW (ref 3.5–5.1)
Sodium: 140 mmol/L (ref 135–145)
Total Protein: 7.2 g/dL (ref 6.5–8.1)

## 2015-10-04 LAB — URINALYSIS, ROUTINE W REFLEX MICROSCOPIC
BILIRUBIN URINE: NEGATIVE
GLUCOSE, UA: NEGATIVE mg/dL
HGB URINE DIPSTICK: NEGATIVE
Ketones, ur: NEGATIVE mg/dL
Leukocytes, UA: NEGATIVE
Nitrite: NEGATIVE
PH: 6.5 (ref 5.0–8.0)
Protein, ur: NEGATIVE mg/dL
SPECIFIC GRAVITY, URINE: 1.019 (ref 1.005–1.030)

## 2015-10-04 LAB — CBC
HCT: 40.7 % (ref 36.0–46.0)
Hemoglobin: 13.2 g/dL (ref 12.0–15.0)
MCH: 27 pg (ref 26.0–34.0)
MCHC: 32.4 g/dL (ref 30.0–36.0)
MCV: 83.2 fL (ref 78.0–100.0)
PLATELETS: 273 10*3/uL (ref 150–400)
RBC: 4.89 MIL/uL (ref 3.87–5.11)
RDW: 15.1 % (ref 11.5–15.5)
WBC: 7.8 10*3/uL (ref 4.0–10.5)

## 2015-10-04 LAB — LIPASE, BLOOD: Lipase: 52 U/L — ABNORMAL HIGH (ref 11–51)

## 2015-10-04 MED ORDER — DIPHENOXYLATE-ATROPINE 2.5-0.025 MG PO TABS: 1.0000 | ORAL_TABLET | Freq: Four times a day (QID) | ORAL | 0 refills | Status: DC | PRN

## 2015-10-04 MED ORDER — POTASSIUM CHLORIDE CRYS ER 20 MEQ PO TBCR
40.0000 meq | EXTENDED_RELEASE_TABLET | Freq: Once | ORAL | Status: AC
  Administered 2015-10-04: 40 meq via ORAL
  Filled 2015-10-04: qty 2

## 2015-10-04 MED ORDER — SODIUM CHLORIDE 0.9 % IV BOLUS (SEPSIS): 1000.0000 mL | Freq: Once | INTRAVENOUS | Status: DC

## 2015-10-04 MED ORDER — SODIUM CHLORIDE 0.9 % IV BOLUS (SEPSIS)
500.0000 mL | Freq: Once | INTRAVENOUS | Status: AC
  Administered 2015-10-04: 500 mL via INTRAVENOUS

## 2015-10-04 MED ORDER — ONDANSETRON 4 MG PO TBDP: 4.0000 mg | ORAL_TABLET | Freq: Three times a day (TID) | ORAL | 0 refills | Status: DC | PRN

## 2015-10-04 NOTE — ED Provider Notes (Signed)
[facility] DEPT Provider Note   CSN: [id number] Arrival date & time: 10/04/15  1650     History   Chief Complaint Chief Complaint  Patient presents with  . Abdominal Pain  . Diarrhea  . Emesis    HPI Kristina Ayala is a 70 y.o. female.  Patient with no past abdominal surgical history -- presents with complaint of diarrhea and vomiting starting 3 days ago has been constant. Patient has some abdominal cramping when she feels like she needs to have a bowel movement but no other significant abdominal pains. Stool is very watery without substance. No blood or fever. Patient vomited this morning, this has been less persistent. She reports a normal colonoscopy in the past. She has been taking oral medications to help with bowel movements, however these have not helped. She denies any recent antibiotics or sick contacts. She denies any suspicious food or water exposures. The onset of this condition was acute. Aggravating factors: none. Alleviating factors: none.        Past Medical History:  Diagnosis Date  . Arthritis   . Bursitis    left elbow  . Clostridium difficile infection   . Frequent falls   . HLD (hyperlipidemia)   . Hypertension   . Infectious colitis     Patient Active Problem List   Diagnosis Date Noted  . Bursitis of right shoulder 09/04/2015  . Lateral meniscus derangement 06/03/2015  . Tear of LCL (lateral collateral ligament) of knee 06/03/2015  . Lumbar radiculopathy 09/19/2014  . Low back pain 01/04/2014  . Bilateral knee pain 01/04/2014  . Diverticulosis of colon without hemorrhage 07/26/2013  . Diarrhea 07/26/2013  . Trigger thumb of left hand 03/06/2013  . Essential hypertension, benign 02/15/2013  . Hyperlipidemia 09/28/2012  . COPD (chronic obstructive pulmonary disease) ([place]) 05/22/2012  . Frequent falls 05/22/2012  . Osteoporosis 05/22/2012    Past Surgical History:  Procedure Laterality Date  . COLONOSCOPY    . ORIF PROXIMAL  TIBIAL PLATEAU FRACTURE Left 02/19/2012  . POLYPECTOMY    . TONSILLECTOMY      OB History    No data available       Home Medications    Prior to Admission medications   Medication Sig Start Date End Date Taking? Authorizing Provider  acetaminophen (TYLENOL) 500 MG tablet Take 1,000 mg by mouth every 6 (six) hours as needed (For burn pain.).    Historical Provider, MD  amLODipine (NORVASC) 5 MG tablet TAKE 1 TABLET(5 MG) BY MOUTH DAILY 04/30/15   [name], MD  atorvastatin (LIPITOR) 40 MG tablet TAKE 1 TABLET(40 MG) BY MOUTH EVERY MORNING 04/30/15   [name], MD  calcium-vitamin D (OSCAL WITH D) 500-200 MG-UNIT per tablet Take 1 tablet by mouth daily with breakfast.     Historical Provider, MD  cholecalciferol (VITAMIN D) 1000 UNITS tablet Take 1,000 Units by mouth daily.    Historical Provider, MD  Cyanocobalamin (VITAMIN B-12) 2500 MCG SUBL Place 1 tablet under the tongue daily.    Historical Provider, MD  ferrous sulfate 325 (65 FE) MG tablet Take 1 tablet (325 mg total) by mouth daily with breakfast. 03/29/14   Lyndal Pulley, DO  gabapentin (NEURONTIN) 100 MG capsule Take 1 capsule (100 mg total) by mouth at bedtime. Patient taking differently: Take 100 mg by mouth daily as needed (pain).  06/03/15   Lyndal Pulley, DO  hydrochlorothiazide (HYDRODIURIL) 25 MG tablet TAKE 1 TABLET(25 MG) BY MOUTH EVERY MORNING 04/30/15  [name], MD  ibuprofen (ADVIL,MOTRIN) 200 MG tablet Take 200-400 mg by mouth every 6 (six) hours as needed for moderate pain.    Historical Provider, MD  Ibuprofen-Famotidine 800-26.6 MG TABS TAKE 1 TABLET DAILY AS NEEDED. 12/09/14   Lyndal Pulley, DO  lisinopril (PRINIVIL,ZESTRIL) 40 MG tablet Take 1 tablet (40 mg total) by mouth every morning. 04/30/15   [name], MD  Magnesium 100 MG CAPS Take 100 mg by mouth daily.    Historical Provider, MD  meloxicam (MOBIC) 15 MG tablet Take 1 tablet (15 mg total) by mouth daily. 12/17/14   Lyndal Pulley, DO    potassium chloride (K-DUR) 10 MEQ tablet TAKE 1 TABLET(10 MEQ) BY MOUTH DAILY 04/30/15   [name], MD  tiZANidine (ZANAFLEX) 4 MG tablet Take 1 tablet (4 mg total) by mouth Nightly. 12/17/14   Lyndal Pulley, DO  TURMERIC PO Take 1 tablet by mouth daily.    Historical Provider, MD    Family History Family History  Problem Relation Age of Onset  . Diverticulosis Mother   . Ovarian cancer Mother   . Pancreatic cancer Father   . Lung cancer Father   . Colon polyps Sister   . Colon cancer Neg Hx     Social History Social History  Substance Use Topics  . Smoking status: Former Smoker    Packs/day: 0.25    Years: 50.00    Types: Cigarettes    Quit date: 08/28/2014  . Smokeless tobacco: Never Used     Comment: 1 pack every 3 days  . Alcohol use No     Allergies   Doxycycline; Minocycline; Wellbutrin [bupropion]; and Penicillins   Review of Systems Review of Systems  Constitutional: Negative for fever.  HENT: Negative for rhinorrhea and sore throat.   Eyes: Negative for redness.  Respiratory: Negative for cough.   Cardiovascular: Negative for chest pain.  Gastrointestinal: Positive for abdominal pain, diarrhea, nausea and vomiting. Negative for blood in stool.  Genitourinary: Negative for dysuria.  Musculoskeletal: Negative for myalgias.  Skin: Negative for rash.  Neurological: Negative for headaches.    Physical Exam Updated Vital Signs BP 151/85 (BP Location: Left Arm)   Pulse 109   Temp 97.9 F (36.6 C) (Oral)   Resp 18   Ht 5\' 5"  (1.651 m)   Wt 75.3 kg   SpO2 97%   BMI 27.62 kg/m   Physical Exam  Constitutional: She appears well-developed and well-nourished.  HENT:  Head: Normocephalic and atraumatic.  Eyes: Conjunctivae are normal. Right eye exhibits no discharge. Left eye exhibits no discharge.  Neck: Normal range of motion. Neck supple.  Cardiovascular: Normal rate, regular rhythm and normal heart sounds.   Pulmonary/Chest: Effort normal and  breath sounds normal.  Abdominal: Soft. She exhibits no mass. There is no tenderness. There is no guarding.  Neurological: She is alert.  Skin: Skin is warm and dry.  Psychiatric: She has a normal mood and affect.  Nursing note and vitals reviewed.   ED Treatments / Results  Labs (all labs ordered are listed, but only abnormal results are displayed) Labs Reviewed  LIPASE, BLOOD - Abnormal; Notable for the following:       Result Value   Lipase 52 (*)    All other components within normal limits  COMPREHENSIVE METABOLIC PANEL - Abnormal; Notable for the following:    Potassium 3.3 (*)    Glucose, Bld 120 (*)    GFR calc non  Af Amer 58 (*)    All other components within normal limits  GASTROINTESTINAL PANEL BY PCR, STOOL (REPLACES STOOL CULTURE)  CBC  URINALYSIS, ROUTINE W REFLEX MICROSCOPIC (NOT AT [facility])    Procedures Procedures (including critical care time)  Medications Ordered in ED Medications  sodium chloride 0.9 % bolus 500 mL (not administered)     Initial Impression / Assessment and Plan / ED Course  I have reviewed the triage vital signs and the nursing notes.  Pertinent labs & imaging results that were available during my care of the patient were reviewed by me and considered in my medical decision making (see chart for details).  Clinical Course   Patient seen and examined. Work-up initiated. D/w and seen by Dr. Oleta Mouse.   Vital signs reviewed and are as follows: BP 151/85 (BP Location: Left Arm)   Pulse 109   Temp 97.9 F (36.6 C) (Oral)   Resp 18   Ht 5\' 5"  (1.651 m)   Wt 75.3 kg   SpO2 97%   BMI 27.62 kg/m   11:34 PM Patient held in emergency department for several hours in hopes that she would have a bowel movement that could be sent for stool studies. Fortunately, patient has had no further diarrhea here. Will discharge to home with Zofran and Lomotil to use as needed if symptoms return. Encouraged patient to follow-up with her primary care  physician.  The patient was urged to return to the Emergency Department immediately with worsening of current symptoms, worsening abdominal pain, persistent vomiting, blood noted in stools, fever, or any other concerns. The patient verbalized understanding.    Final Clinical Impressions(s) / ED Diagnoses   Final diagnoses:  Vomiting and diarrhea   Patient with 3 days of vomiting and diarrhea, without blood or fever. Lab work here is reassuring. Patient without any further episodes of diarrhea or vomiting in emergency department. No indications for CT imaging at this time. No tenderness exhibited on exam. Patient exam stable during ED stay. Discharged home with symptomatic therapy.  New Prescriptions New Prescriptions   DIPHENOXYLATE-ATROPINE (LOMOTIL) 2.5-0.025 MG TABLET    Take 1 tablet by mouth 4 (four) times daily as needed for diarrhea or loose stools.   ONDANSETRON (ZOFRAN ODT) 4 MG DISINTEGRATING TABLET    Take 1 tablet (4 mg total) by mouth every 8 (eight) hours as needed for nausea or vomiting.     Carlisle Cater, PA-C 10/04/15 [place] Liu, MD 10/04/15 [phone]

## 2015-10-04 NOTE — ED Notes (Addendum)
Assisted pt. To the bathroom for a stool sample. Pt. Was unsuccessful.

## 2015-10-04 NOTE — ED Provider Notes (Signed)
Medical screening examination/treatment/procedure(s) were conducted as a shared visit with non-physician practitioner(s) and myself.  I personally evaluated the patient during the encounter.   EKG Interpretation None      70 year old female, no prior abd surgeries, p/w n/v/d x 3 days. Non-bloody diarrhea and vomit. No fever or chills. Feels weak. Intermittent abdominal cramping associated with diarrhea, and improved after. Currently no abd pain. No recent abx, travel, food exposures, or known sick contacts. Mildly tachycardic in ED, afebrile, normotensive. In no acute distress. Appears dry on exam. Soft and nontender abdomen. No Ct imaging felt necessary. Mild hypoK on blood work, but no other major electrolyte or metabolic derangements. Will rehydrate. Discussed supportive care instructions for home. Suspect benign GI illness. Strict return and follow-up instructions reviewed. She expressed understanding of all discharge instructions and felt comfortable with the plan of care.    Forde Dandy, MD 10/04/15 2352

## 2015-10-04 NOTE — ED Notes (Signed)
Pt has tried to offer the stool sample, unsuccessful at this time.

## 2015-10-04 NOTE — Discharge Instructions (Signed)
Please read and follow all provided instructions.  Your diagnoses today include:  1. Vomiting and diarrhea     Tests performed today include:  Blood counts and electrolytes - slightly low potassium  Blood tests to check liver and kidney function  Blood tests to check pancreas function  Urine test to look for infection  Vital signs. See below for your results today.   Medications prescribed:   Zofran (ondansetron) - for nausea and vomiting   Lomotil - medication to use in case diarrhea returns  Take any prescribed medications only as directed.  Home care instructions:   Follow any educational materials contained in this packet.  Follow-up instructions: Please follow-up with your primary care provider in the next 3 days for further evaluation of your symptoms.    Return instructions:  SEEK IMMEDIATE MEDICAL ATTENTION IF:  The pain does not go away or becomes severe   A temperature above 101F develops   Repeated vomiting occurs (multiple episodes)   The pain becomes localized to portions of the abdomen. The right side could possibly be appendicitis. In an adult, the left lower portion of the abdomen could be colitis or diverticulitis.   Blood is being passed in stools or vomit (bright red or black tarry stools)   You develop chest pain, difficulty breathing, dizziness or fainting, or become confused, poorly responsive, or inconsolable (young children)  If you have any other emergent concerns regarding your health  Additional Information: Abdominal (belly) pain can be caused by many things. Your caregiver performed an examination and possibly ordered blood/urine tests and imaging (CT scan, x-rays, ultrasound). Many cases can be observed and treated at home after initial evaluation in the emergency department. Even though you are being discharged home, abdominal pain can be unpredictable. Therefore, you need a repeated exam if your pain does not resolve, returns, or  worsens. Most patients with abdominal pain don't have to be admitted to the hospital or have surgery, but serious problems like appendicitis and gallbladder attacks can start out as nonspecific pain. Many abdominal conditions cannot be diagnosed in one visit, so follow-up evaluations are very important.  Your vital signs today were: BP 153/84 (BP Location: Left Arm)    Pulse 95    Temp 98.3 F (36.8 C) (Oral)    Resp 14    Ht 5\' 5"  (1.651 m)    Wt 75.3 kg    SpO2 100%    BMI 27.62 kg/m  If your blood pressure (bp) was elevated above 135/85 this visit, please have this repeated by your doctor within one month. --------------

## 2015-10-04 NOTE — ED Triage Notes (Signed)
Pt states that 3 days ago she started having bilateral lower quadrant abdominal pain with N/V/D with loss of appetite. Pt states that her symptoms have gotten progressively worse over the past 3 days. Denies fever. Denies chest pain or SOB. Ambulatory. A&Ox4.

## 2015-11-03 ENCOUNTER — Other Ambulatory Visit: Payer: Self-pay | Admitting: Adult Health

## 2015-11-04 ENCOUNTER — Other Ambulatory Visit: Payer: Self-pay | Admitting: Adult Health

## 2015-11-04 NOTE — Telephone Encounter (Signed)
Ok to refill for 30 days  

## 2015-11-04 NOTE — Telephone Encounter (Signed)
Rx called in as directed.   

## 2015-11-11 ENCOUNTER — Ambulatory Visit: Payer: Medicare Other | Admitting: Internal Medicine

## 2015-12-04 DIAGNOSIS — L602 Onychogryphosis: Secondary | ICD-10-CM | POA: Diagnosis not present

## 2015-12-04 DIAGNOSIS — L84 Corns and callosities: Secondary | ICD-10-CM | POA: Diagnosis not present

## 2015-12-19 ENCOUNTER — Encounter (HOSPITAL_COMMUNITY): Payer: Self-pay

## 2015-12-19 ENCOUNTER — Emergency Department (HOSPITAL_COMMUNITY)
Admission: EM | Admit: 2015-12-19 | Discharge: 2015-12-19 | Disposition: A | Discharge status: Home / Self Care | Payer: Medicare Other | Attending: Emergency Medicine | Admitting: Emergency Medicine

## 2015-12-19 DIAGNOSIS — J449 Chronic obstructive pulmonary disease, unspecified: Secondary | ICD-10-CM | POA: Diagnosis not present

## 2015-12-19 DIAGNOSIS — J029 Acute pharyngitis, unspecified: Secondary | ICD-10-CM | POA: Diagnosis present

## 2015-12-19 DIAGNOSIS — Z79899 Other long term (current) drug therapy: Secondary | ICD-10-CM | POA: Diagnosis not present

## 2015-12-19 DIAGNOSIS — Z87891 Personal history of nicotine dependence: Secondary | ICD-10-CM | POA: Insufficient documentation

## 2015-12-19 DIAGNOSIS — J069 Acute upper respiratory infection, unspecified: Secondary | ICD-10-CM | POA: Diagnosis not present

## 2015-12-19 DIAGNOSIS — I10 Essential (primary) hypertension: Secondary | ICD-10-CM | POA: Insufficient documentation

## 2015-12-19 LAB — RAPID STREP SCREEN (MED CTR MEBANE ONLY): Streptococcus, Group A Screen (Direct): NEGATIVE

## 2015-12-19 NOTE — ED Triage Notes (Signed)
Pt complains of head congestion and a sore throat, she states she can hardly swallow  Her grandbaby had an ear infection this week

## 2015-12-19 NOTE — Discharge Instructions (Signed)
Continue your current treatments including guaifenesin, lozenges, and drink plenty of fluids.  Use Tylenol for pain or fever.

## 2015-12-19 NOTE — ED Notes (Signed)
Bed: WA03 Expected date:  Expected time:  Means of arrival:  Comments: 

## 2015-12-19 NOTE — ED Provider Notes (Signed)
[facility] DEPT Provider Note   CSN: [id number] Arrival date & time: 12/19/15  [id number]     History   Chief Complaint Chief Complaint  Patient presents with  . Sore Throat  . Nasal Congestion    HPI Kristina Ayala is a 70 y.o. female.  She complains of sinus congestion, sore throat, cough, nasal drainage and sputum production for several days. She was around her grandchild who had a similar illness last week. She denies nausea, vomiting, dysuria, or constipation. There's been no weakness or dizziness. There are no other known modifying factors.  HPI  Past Medical History:  Diagnosis Date  . Arthritis   . Bursitis    left elbow  . Clostridium difficile infection   . Frequent falls   . HLD (hyperlipidemia)   . Hypertension   . Infectious colitis     Patient Active Problem List   Diagnosis Date Noted  . Bursitis of right shoulder 09/04/2015  . Lateral meniscus derangement 06/03/2015  . Tear of LCL (lateral collateral ligament) of knee 06/03/2015  . Lumbar radiculopathy 09/19/2014  . Low back pain 01/04/2014  . Bilateral knee pain 01/04/2014  . Diverticulosis of colon without hemorrhage 07/26/2013  . Diarrhea 07/26/2013  . Trigger thumb of left hand 03/06/2013  . Essential hypertension, benign 02/15/2013  . Hyperlipidemia 09/28/2012  . COPD (chronic obstructive pulmonary disease) ([place]) 05/22/2012  . Frequent falls 05/22/2012  . Osteoporosis 05/22/2012    Past Surgical History:  Procedure Laterality Date  . COLONOSCOPY    . ORIF PROXIMAL TIBIAL PLATEAU FRACTURE Left 02/19/2012  . POLYPECTOMY    . TONSILLECTOMY      OB History    No data available       Home Medications    Prior to Admission medications   Medication Sig Start Date End Date Taking? Authorizing Provider  acetaminophen (TYLENOL) 500 MG tablet Take 1,000 mg by mouth every 6 (six) hours as needed (For burn pain.).    Historical Provider, MD  ALPRAZolam Duanne Moron) 0.5 MG tablet TAKE 1  TABLET BY MOUTH EVERY NIGHT AT BEDTIME AS NEEDED FOR ANXIETY 11/04/15   Dorothyann Peng, NP  amLODipine (NORVASC) 5 MG tablet TAKE 1 TABLET(5 MG) BY MOUTH DAILY 04/30/15   [name], MD  atorvastatin (LIPITOR) 40 MG tablet TAKE 1 TABLET(40 MG) BY MOUTH EVERY MORNING 04/30/15   [name], MD  calcium-vitamin D (OSCAL WITH D) 500-200 MG-UNIT per tablet Take 1 tablet by mouth daily with breakfast.     Historical Provider, MD  cholecalciferol (VITAMIN D) 1000 UNITS tablet Take 1,000 Units by mouth daily.    Historical Provider, MD  Cyanocobalamin (VITAMIN B-12) 2500 MCG SUBL Place 1 tablet under the tongue daily.    Historical Provider, MD  diphenoxylate-atropine (LOMOTIL) 2.5-0.025 MG tablet Take 1 tablet by mouth 4 (four) times daily as needed for diarrhea or loose stools. 10/04/15   Carlisle Cater, PA-C  ferrous sulfate 325 (65 FE) MG tablet Take 1 tablet (325 mg total) by mouth daily with breakfast. 03/29/14   Lyndal Pulley, DO  gabapentin (NEURONTIN) 100 MG capsule Take 1 capsule (100 mg total) by mouth at bedtime. Patient taking differently: Take 100 mg by mouth daily as needed (pain).  06/03/15   Lyndal Pulley, DO  hydrochlorothiazide (HYDRODIURIL) 25 MG tablet TAKE 1 TABLET(25 MG) BY MOUTH EVERY MORNING 04/30/15   [name], MD  ibuprofen (ADVIL,MOTRIN) 200 MG tablet Take 200-400 mg by mouth every 6 (six) hours  as needed for moderate pain.    Historical Provider, MD  Ibuprofen-Famotidine 800-26.6 MG TABS TAKE 1 TABLET DAILY AS NEEDED. 12/09/14   Lyndal Pulley, DO  lisinopril (PRINIVIL,ZESTRIL) 40 MG tablet Take 1 tablet (40 mg total) by mouth every morning. 04/30/15   [name], MD  Magnesium 100 MG CAPS Take 100 mg by mouth daily.    Historical Provider, MD  meloxicam (MOBIC) 15 MG tablet Take 1 tablet (15 mg total) by mouth daily. 12/17/14   Lyndal Pulley, DO  ondansetron (ZOFRAN ODT) 4 MG disintegrating tablet Take 1 tablet (4 mg total) by mouth every 8 (eight) hours as needed for  nausea or vomiting. 10/04/15   Carlisle Cater, PA-C  potassium chloride (K-DUR) 10 MEQ tablet TAKE 1 TABLET(10 MEQ) BY MOUTH DAILY 04/30/15   [name], MD  tiZANidine (ZANAFLEX) 4 MG tablet Take 1 tablet (4 mg total) by mouth Nightly. 12/17/14   Lyndal Pulley, DO  TURMERIC PO Take 1 tablet by mouth daily.    Historical Provider, MD    Family History Family History  Problem Relation Age of Onset  . Diverticulosis Mother   . Ovarian cancer Mother   . Pancreatic cancer Father   . Lung cancer Father   . Colon polyps Sister   . Colon cancer Neg Hx     Social History Social History  Substance Use Topics  . Smoking status: Former Smoker    Packs/day: 0.25    Years: 50.00    Types: Cigarettes    Quit date: 08/28/2014  . Smokeless tobacco: Never Used     Comment: 1 pack every 3 days  . Alcohol use No     Allergies   Doxycycline; Minocycline; Wellbutrin [bupropion]; and Penicillins   Review of Systems Review of Systems  All other systems reviewed and are negative.    Physical Exam Updated Vital Signs BP 173/96 (BP Location: Left Arm)   Pulse 91   Temp 98.4 F (36.9 C) (Oral)   Resp 18   SpO2 100%   Physical Exam  Constitutional: She is oriented to person, place, and time. She appears well-developed and well-nourished. No distress.  HENT:  Head: Normocephalic and atraumatic.  No posterior pharynx swelling or deformity. No trismus.  Eyes: Conjunctivae and EOM are normal. Pupils are equal, round, and reactive to light.  Neck: Normal range of motion and phonation normal. Neck supple.  Cardiovascular: Normal rate and regular rhythm.   Pulmonary/Chest: Effort normal and breath sounds normal. No respiratory distress. She has no wheezes. She exhibits no tenderness.  Cough with clear sputum production, during examination.  Abdominal: Soft. She exhibits no distension. There is no tenderness. There is no guarding.  Musculoskeletal: Normal range of motion.  Neurological: She  is alert and oriented to person, place, and time. She exhibits normal muscle tone.  Skin: Skin is warm and dry.  Psychiatric: She has a normal mood and affect. Her behavior is normal. Judgment and thought content normal.  Nursing note and vitals reviewed.    ED Treatments / Results  Labs (all labs ordered are listed, but only abnormal results are displayed) Labs Reviewed  RAPID STREP SCREEN (NOT AT [facility])  CULTURE, GROUP A STREP [facility])    EKG  EKG Interpretation None       Radiology No results found.  Procedures Procedures (including critical care time)  Medications Ordered in ED Medications - No data to display   Initial Impression / Assessment and Plan /  ED Course  I have reviewed the triage vital signs and the nursing notes.  Pertinent labs & imaging results that were available during my care of the patient were reviewed by me and considered in my medical decision making (see chart for details).  Clinical Course     Medications - No data to display  Patient Vitals for the past 24 hrs:  BP Temp Temp src Pulse Resp SpO2  12/19/15 0633 173/96 98.4 F (36.9 C) Oral 91 18 100 %    8:27 AM Reevaluation with update and discussion. After initial assessment and treatment, an updated evaluation reveals No change in clinical status. Findings discussed with patient and all questions answered. Jaeden Messer L   Final Clinical Impressions(s) / ED Diagnoses   Final diagnoses:  Viral upper respiratory tract infection   Nursing Notes Reviewed/ Care Coordinated Applicable Imaging Reviewed Interpretation of Laboratory Data incorporated into ED treatment  The patient appears reasonably screened and/or stabilized for discharge and I doubt any other medical condition or other [facility] requiring further screening, evaluation, or treatment in the ED at this time prior to discharge.  Plan: Home Medications- continue; Home Treatments- res, fluids; return here if the recommended  treatment, does not improve the symptoms; Recommended follow up- PCP prn   New Prescriptions New Prescriptions   No medications on file     Daleen Bo, MD 12/19/15 [phone]

## 2015-12-21 LAB — CULTURE, GROUP A STREP (THRC)

## 2015-12-22 ENCOUNTER — Encounter: Payer: Self-pay | Admitting: Internal Medicine

## 2015-12-22 ENCOUNTER — Ambulatory Visit (INDEPENDENT_AMBULATORY_CARE_PROVIDER_SITE_OTHER): Payer: Medicare Other | Admitting: Internal Medicine

## 2015-12-22 VITALS — BP 134/84 | HR 116 | Temp 98.3°F | Resp 18 | Wt 169.0 lb

## 2015-12-22 DIAGNOSIS — B349 Viral infection, unspecified: Secondary | ICD-10-CM | POA: Diagnosis not present

## 2015-12-22 MED ORDER — HYDROCODONE-HOMATROPINE 5-1.5 MG/5ML PO SYRP: 5.0000 mL | ORAL_SOLUTION | Freq: Three times a day (TID) | ORAL | 0 refills | Status: DC | PRN

## 2015-12-22 NOTE — Assessment & Plan Note (Signed)
Rapid Strep and culture neg from the ED Likely viral - her symptoms improve, then worsen depending on rest - stressed increased rest, fluids Continue otc cold meds Start hycodan cough syrup prn Call if no improvement

## 2015-12-22 NOTE — Patient Instructions (Signed)
A prescription for a cough syrup was given to you.    Try taking flonase once daily.   If your symptoms worsen or fail to improve, please contact our office for further instruction, or in case of emergency go directly to the emergency room at the closest medical facility.   General Recommendations:    Please drink plenty of fluids.  Get plenty of rest   Sleep in humidified air  Use saline nasal sprays  Netti pot  OTC Medications:  Decongestants - helps relieve congestion   Flonase (generic fluticasone) or Nasacort (generic triamcinolone) - please make sure to use the "cross-over" technique at a 45 degree angle towards the opposite eye as opposed to straight up the nasal passageway.   Sudafed (generic pseudoephedrine - Note this is the one that is available behind the pharmacy counter); Products with phenylephrine (-PE) may also be used but is often not as effective as pseudoephedrine.   If you have HIGH BLOOD PRESSURE - Coricidin HBP; AVOID any product that is -D as this contains pseudoephedrine which may increase your blood pressure.  Afrin (oxymetazoline) every 6-8 hours for up to 3 days.  Allergies - helps relieve runny nose, itchy eyes and sneezing   Claritin (generic loratidine), Allegra (fexofenidine), or Zyrtec (generic cyrterizine) for runny nose. These medications should not cause drowsiness.  Note - Benadryl (generic diphenhydramine) may be used however may cause drowsiness  Cough -   Delsym or Robitussin (generic dextromethorphan)  Expectorants - helps loosen mucus to ease removal   Mucinex (generic guaifenesin) as directed on the package.  Headaches / General Aches   Tylenol (generic acetaminophen) - DO NOT EXCEED 3 grams (3,000 mg) in a 24 hour time period  Advil/Motrin (generic ibuprofen)  Sore Throat -   Salt water gargle   Chloraseptic (generic benzocaine) spray or lozenges / Sucrets (generic dyclonine)

## 2015-12-22 NOTE — Progress Notes (Signed)
Pre visit review using our clinic review tool, if applicable. No additional management support is needed unless otherwise documented below in the visit note. 

## 2015-12-22 NOTE — Progress Notes (Signed)
Subjective:    Patient ID: Kristina Ayala, female    DOB: 31-Dec-1945, 70 y.o.   MRN: [id number]  HPI She is here for an acute visit for cold symptoms.   Her symptoms started after spending time with her granddaughter over one week ago who had an ear infection  She first had a sore throat, head congestion and then end of last week she had difficulty swallowing and no appetite.   Her throat seemed to close up and she went to the ED.  Her strept test and culture were negative and she was diagnosed with a viral illness.  She still has sore throat.   She has gargled with Listerine, anti-histamine, mucinex, drinking fluids and has taken tylenol.    Her mucus has been flowing and she has a had a lot of PND. Last night her tonsils or lymph nodes were throbbing in her neck.    She feels her symptoms get better, but then she does not have enough time to rest and her symptoms get worse.    She is using nicorette gum to help her stop smoking.     Medications and allergies reviewed with patient and updated if appropriate.  Patient Active Problem List   Diagnosis Date Noted  . Bursitis of right shoulder 09/04/2015  . Lateral meniscus derangement 06/03/2015  . Tear of LCL (lateral collateral ligament) of knee 06/03/2015  . Lumbar radiculopathy 09/19/2014  . Low back pain 01/04/2014  . Bilateral knee pain 01/04/2014  . Diverticulosis of colon without hemorrhage 07/26/2013  . Diarrhea 07/26/2013  . Trigger thumb of left hand 03/06/2013  . Essential hypertension, benign 02/15/2013  . Hyperlipidemia 09/28/2012  . COPD (chronic obstructive pulmonary disease) ([place]) 05/22/2012  . Frequent falls 05/22/2012  . Osteoporosis 05/22/2012    Current Outpatient Prescriptions on File Prior to Visit  Medication Sig Dispense Refill  . acetaminophen (TYLENOL) 500 MG tablet Take 1,000 mg by mouth every 6 (six) hours as needed (For burn pain.).    [name] ALPRAZolam (XANAX) 0.5 MG tablet TAKE 1 TABLET BY  MOUTH EVERY NIGHT AT BEDTIME AS NEEDED FOR ANXIETY 30 tablet 0  . amLODipine (NORVASC) 5 MG tablet TAKE 1 TABLET(5 MG) BY MOUTH DAILY 90 tablet 3  . atorvastatin (LIPITOR) 40 MG tablet TAKE 1 TABLET(40 MG) BY MOUTH EVERY MORNING 90 tablet 3  . calcium-vitamin D (OSCAL WITH D) 500-200 MG-UNIT per tablet Take 1 tablet by mouth daily with breakfast.     . cholecalciferol (VITAMIN D) 1000 UNITS tablet Take 1,000 Units by mouth daily.    . Cyanocobalamin (VITAMIN B-12) 2500 MCG SUBL Place 1 tablet under the tongue daily.    . diphenoxylate-atropine (LOMOTIL) 2.5-0.025 MG tablet Take 1 tablet by mouth 4 (four) times daily as needed for diarrhea or loose stools. 15 tablet 0  . ferrous sulfate 325 (65 FE) MG tablet Take 1 tablet (325 mg total) by mouth daily with breakfast. 30 tablet 3  . gabapentin (NEURONTIN) 100 MG capsule Take 1 capsule (100 mg total) by mouth at bedtime. (Patient taking differently: Take 100 mg by mouth daily as needed (pain). ) 90 capsule 3  . hydrochlorothiazide (HYDRODIURIL) 25 MG tablet TAKE 1 TABLET(25 MG) BY MOUTH EVERY MORNING 90 tablet 3  . ibuprofen (ADVIL,MOTRIN) 200 MG tablet Take 200-400 mg by mouth every 6 (six) hours as needed for moderate pain.    . Ibuprofen-Famotidine 800-26.6 MG TABS TAKE 1 TABLET DAILY AS NEEDED. 270 tablet 3  .  lisinopril (PRINIVIL,ZESTRIL) 40 MG tablet Take 1 tablet (40 mg total) by mouth every morning. 90 tablet 3  . Magnesium 100 MG CAPS Take 100 mg by mouth daily.    . meloxicam (MOBIC) 15 MG tablet Take 1 tablet (15 mg total) by mouth daily. 30 tablet 0  . ondansetron (ZOFRAN ODT) 4 MG disintegrating tablet Take 1 tablet (4 mg total) by mouth every 8 (eight) hours as needed for nausea or vomiting. 10 tablet 0  . potassium chloride (K-DUR) 10 MEQ tablet TAKE 1 TABLET(10 MEQ) BY MOUTH DAILY 90 tablet 3  . tiZANidine (ZANAFLEX) 4 MG tablet Take 1 tablet (4 mg total) by mouth Nightly. 30 tablet 2  . TURMERIC PO Take 1 tablet by mouth daily.      No current facility-administered medications on file prior to visit.     Past Medical History:  Diagnosis Date  . Arthritis   . Bursitis    left elbow  . Clostridium difficile infection   . Frequent falls   . HLD (hyperlipidemia)   . Hypertension   . Infectious colitis     Past Surgical History:  Procedure Laterality Date  . COLONOSCOPY    . ORIF PROXIMAL TIBIAL PLATEAU FRACTURE Left 02/19/2012  . POLYPECTOMY    . TONSILLECTOMY      Social History   Social History  . Marital status: Divorced    Spouse name: N/A  . Number of children: 2  . Years of education: N/A   Occupational History  . RETIRED    Social History Main Topics  . Smoking status: Former Smoker    Packs/day: 0.25    Years: 50.00    Types: Cigarettes    Quit date: 08/28/2014  . Smokeless tobacco: Never Used     Comment: 1 pack every 3 days  . Alcohol use No  . Drug use: No  . Sexual activity: Not on file   Other Topics Concern  . Not on file   Social History Narrative   Grandson (25 y/o) lives with patient.     Son lives in [place]   Daughter lives in [place]   She is originally from [place], [place]   Divorced             Family History  Problem Relation Age of Onset  . Diverticulosis Mother   . Ovarian cancer Mother   . Pancreatic cancer Father   . Lung cancer Father   . Colon polyps Sister   . Colon cancer Neg Hx     Review of Systems  Constitutional: Positive for appetite change (decreased). Negative for fever.  HENT: Positive for ear pain (pressure), postnasal drip and sore throat (improved). Negative for congestion, sinus pain, sinus pressure and trouble swallowing.   Respiratory: Positive for cough (from PND only) and shortness of breath. Negative for wheezing.   Cardiovascular: Negative for chest pain.  Gastrointestinal: Negative for diarrhea and nausea.  Musculoskeletal: Positive for myalgias.  Neurological: Positive for light-headedness and headaches.        Objective:   Vitals:   12/22/15 1136  BP: 134/84  Pulse: (!) 116  Resp: 18  Temp: 98.3 F (36.8 C)   Filed Weights   12/22/15 1136  Weight: 169 lb (76.7 kg)   Body mass index is 28.12 kg/m.   Physical Exam GENERAL APPEARANCE: Appears stated age, well appearing, NAD EYES: conjunctiva clear, no icterus HEENT: bilateral tympanic membranes and ear canals normal, oropharynx with mild erythema, no thyromegaly, trachea midline,  mild cervical lymphadenopathy, no supraclavicular lymphadenopathy LUNGS: Clear to auscultation without wheeze or crackles, unlabored breathing, good air entry bilaterally HEART: Normal S1,S2 without murmurs EXTREMITIES: Without clubbing, cyanosis, or edema        Assessment & Plan:   See Problem List for Assessment and Plan of chronic medical problems.

## 2016-01-04 NOTE — Progress Notes (Signed)
[facility] [address], [place] [address] Phone: [phone] Subjective:    I'm seeing this patient by the request  of:    CC: bilateral leg pai  [id number]  Kristina Ayala is a 70 y.o. female coming in with complaint of  Bilateral leg pain. Patient states this seems to be worse at night. More of a cramping sensation. Sometimes with standing for long amount of time can have a very similar presentation. Patient states that this is been going on for quite some time but worsening recently.Patient denies any change in medicineOr diet. Has been trying to be more active and to lose weight. Patient states though that it can be's significant that wakes her up at night Rates severity of pain 8/10 sometimes.  Can affect daily activities.     Past Medical History:  Diagnosis Date  . Arthritis   . Bursitis    left elbow  . Clostridium difficile infection   . Frequent falls   . HLD (hyperlipidemia)   . Hypertension   . Infectious colitis    Past Surgical History:  Procedure Laterality Date  . COLONOSCOPY    . ORIF PROXIMAL TIBIAL PLATEAU FRACTURE Left 02/19/2012  . POLYPECTOMY    . TONSILLECTOMY     Social History   Social History  . Marital status: Divorced    Spouse name: N/A  . Number of children: 2  . Years of education: N/A   Occupational History  . RETIRED    Social History Main Topics  . Smoking status: Former Smoker    Packs/day: 0.25    Years: 50.00    Types: Cigarettes    Quit date: 08/28/2014  . Smokeless tobacco: Never Used     Comment: 1 pack every 3 days  . Alcohol use No  . Drug use: No  . Sexual activity: Not Asked   Other Topics Concern  . None   Social History Narrative   Grandson (79 y/o) lives with patient.     Son lives in [place]   Daughter lives in [place]   She is originally from [place], [place]   Divorced            Allergies  Allergen Reactions  . Doxycycline Diarrhea  . Minocycline  Diarrhea  . Wellbutrin [Bupropion] Swelling    Per pt her tongue was swollen and sore/symptoms stopped once the medication was discontinued.   [name] Penicillins Rash    Has patient had a PCN reaction causing immediate rash, facial/tongue/throat swelling, SOB or lightheadedness with hypotension: yes Has patient had a PCN reaction causing severe rash involving mucus membranes or skin necrosis: no Has patient had a PCN reaction that required hospitalization: unknown Has patient had a PCN reaction occurring within the last 10 years: no If all of the above answers are "NO", then may proceed with Cephalosporin use.    Family History  Problem Relation Age of Onset  . Diverticulosis Mother   . Ovarian cancer Mother   . Pancreatic cancer Father   . Lung cancer Father   . Colon polyps Sister   . Colon cancer Neg Hx     Past medical history, social, surgical and family history all reviewed in electronic medical record.  No pertanent information unless stated regarding to the chief complaint.   Review of Systems: No headache, visual changes, nausea, vomiting, diarrhea, constipation, dizziness, abdominal pain, skin rash, fevers, chills, night sweats, weight loss, swollen lymph nodes,  chest pain,  shortness of breath, mood changes.    Objective  Blood pressure 128/80, pulse 95, height 5\' 5"  (1.651 m), weight 169 lb (76.7 kg), SpO2 98 %.   Systems examined below as of 01/05/16 General: NAD A&O x3 mood, affect normal  HEENT: Pupils equal, extraocular movements intact no nystagmus Respiratory: not short of breath at rest or with speaking Cardiovascular: No lower extremity edema, non tender Skin: Warm dry intact with no signs of infection or rash on extremities or on axial skeleton. Abdomen: Soft nontender, no masses Neuro: Cranial nerves  intact, neurovascularly intact in all extremities with 2+ DTRs and 2+ pulses. Lymph: No lymphadenopathy appreciated today  Gait mild antalgic gait.  MSK: Non  tender with full range of motion and good stability and symmetric strength and tone of shoulders, elbows, wrist,  knee hips and ankles bilaterally.    Knee:Bilateral Normal to inspection with no erythema or effusion or obvious bony abnormalities.  discomfort more of the proximal AND distal quadriceps. ROM full in flexion and extension and lower leg rotation. Ligaments with solid consistent endpoints including ACL, PCL, LCL, MCL. Negative Mcmurray's, Apley's, and Thessalonian tests. Non painful patellar compression. Patellar glide without crepitus. Patellar and quadriceps tendons unremarkable. Hamstring and quadriceps strength is normal.       Impression and Recommendations:     This case required medical decision making of moderate complexity.      Note: This dictation was prepared with Dragon dictation along with smaller phrase technology. Any transcriptional errors that result from this process are unintentional.

## 2016-01-05 ENCOUNTER — Ambulatory Visit (INDEPENDENT_AMBULATORY_CARE_PROVIDER_SITE_OTHER): Payer: Medicare Other | Admitting: Family Medicine

## 2016-01-05 ENCOUNTER — Encounter: Payer: Self-pay | Admitting: Family Medicine

## 2016-01-05 VITALS — BP 128/80 | HR 95 | Ht 65.0 in | Wt 169.0 lb

## 2016-01-05 DIAGNOSIS — E876 Hypokalemia: Secondary | ICD-10-CM

## 2016-01-05 DIAGNOSIS — M25561 Pain in right knee: Secondary | ICD-10-CM

## 2016-01-05 DIAGNOSIS — M5416 Radiculopathy, lumbar region: Secondary | ICD-10-CM

## 2016-01-05 DIAGNOSIS — G8929 Other chronic pain: Secondary | ICD-10-CM

## 2016-01-05 DIAGNOSIS — M25562 Pain in left knee: Secondary | ICD-10-CM | POA: Diagnosis not present

## 2016-01-05 MED ORDER — POTASSIUM CHLORIDE ER 8 MEQ PO TBCR: 16.0000 meq | EXTENDED_RELEASE_TABLET | Freq: Every day | ORAL | 3 refills | Status: DC

## 2016-01-05 MED ORDER — GABAPENTIN 300 MG PO CAPS: 300.0000 mg | ORAL_CAPSULE | Freq: Every day | ORAL | 3 refills | Status: DC

## 2016-01-05 NOTE — Assessment & Plan Note (Signed)
Change meds per orders.

## 2016-01-05 NOTE — Patient Instructions (Signed)
Good to see you  New prescription for the potassium and for the gabapentin Take them as prescribed Try the lipitor at 3 times a week or 1/2 pill daily  Iron 3 times a week with 500mg  of vitamin C will help with absorption and will decrease constipation  See me again in 4 weeks if you can  Happy holidays!

## 2016-01-05 NOTE — Assessment & Plan Note (Signed)
Patient is having more of bilateral knee and leg pain. Could be secondary to more of her lumbar radiculopathy. Patient feels it is more secondary to just cramping in her legs. Patient has had hypokalemia previously and we will increase dose, we discussed monitoring calcium levels and we discussed vitamin D replacement. Iron supplementation also discused and started on gabapentin in case there is a lumbar radiculopathy. Patient will try all these different interventions and come back again in 4 weeks. Worsening symptoms consider further evaluation of lumbar radiculopathy.

## 2016-01-13 ENCOUNTER — Ambulatory Visit: Payer: Medicare Other

## 2016-01-26 NOTE — Progress Notes (Signed)
Pre visit review using our clinic review tool, if applicable. No additional management support is needed unless otherwise documented below in the visit note. 

## 2016-01-26 NOTE — Progress Notes (Addendum)
Subjective:   Kristina Ayala is a 71 y.o. female who presents for Medicare Annual (Subsequent) preventive examination.  The Patient was informed that the wellness visit is to identify future health risk and educate and initiate measures that can reduce risk for increased disease through the lifespan.    Review of Systems:  No ROS.  Medicare Wellness Visit.  Cardiac Risk Factors include: advanced age (>22men, >26 women);dyslipidemia;family history of premature cardiovascular disease;hypertension   Sleep patterns: Sleeps 6 hours.  Home Safety/Smoke Alarms:  Smoke detectors and security in place.  Living environment; residence and Firearm Safety: Lives in 2 story town home, has Research officer, political party. Feels safe. No firearms. Family does not visit often, does receive calls frequently.  Seat Belt Safety/Bike Helmet: wears seatbelt   Counseling:   Eye Exam-Last exam 04/2015, every other year.   Dental-Last exam 10/2015, followed yearly by [facility]  Female:   Pap-N/A       Mammo-05/28/2015, negative.      Dexa scan-01/08/2014, low BMD. Will schedule with mammogram 05/2016.     CCS-colonoscopy 06/07/2012, normal. Recall 10 years.        Objective:     Vitals: BP 126/70 (BP Location: Right Arm, Patient Position: Sitting, Cuff Size: Normal)   Pulse 99   Resp 18   Ht 5\' 5"  (1.651 m)   Wt 170 lb 0.6 oz (77.1 kg)   SpO2 98%   BMI 28.30 kg/m   Body mass index is 28.3 kg/m.   Tobacco History  Smoking Status  . Former Smoker  . Packs/day: 0.25  . Years: 50.00  . Types: Cigarettes  . Quit date: 08/28/2014  Smokeless Tobacco  . Never Used    Comment: 1 pack every 3 days     Counseling given: Not Answered   Past Medical History:  Diagnosis Date  . Arthritis   . Bursitis    left elbow  . Clostridium difficile infection   . Frequent falls   . HLD (hyperlipidemia)   . Hypertension   . Infectious colitis    Past Surgical History:  Procedure Laterality Date   . COLONOSCOPY    . ORIF PROXIMAL TIBIAL PLATEAU FRACTURE Left 02/19/2012  . POLYPECTOMY    . TONSILLECTOMY     Family History  Problem Relation Age of Onset  . Diverticulosis Mother   . Ovarian cancer Mother   . Pancreatic cancer Father   . Lung cancer Father   . Colon polyps Sister   . Colon cancer Neg Hx    History  Sexual Activity  . Sexual activity: Not on file    Outpatient Encounter Prescriptions as of 01/27/2016  Medication Sig  . acetaminophen (TYLENOL) 500 MG tablet Take 1,000 mg by mouth every 6 (six) hours as needed (For burn pain.).  [name] ALPRAZolam (XANAX) 0.5 MG tablet TAKE 1 TABLET BY MOUTH EVERY NIGHT AT BEDTIME AS NEEDED FOR ANXIETY  . amLODipine (NORVASC) 5 MG tablet TAKE 1 TABLET(5 MG) BY MOUTH DAILY  . atorvastatin (LIPITOR) 40 MG tablet TAKE 1 TABLET(40 MG) BY MOUTH EVERY MORNING (Patient taking differently: Take 20 mg by mouth. TAKE 1 TABLET(40 MG) BY MOUTH EVERY MORNING)  . calcium-vitamin D (OSCAL WITH D) 500-200 MG-UNIT per tablet Take 1 tablet by mouth daily with breakfast.   . cholecalciferol (VITAMIN D) 1000 UNITS tablet Take 1,000 Units by mouth daily.  . Cyanocobalamin (VITAMIN B-12) 2500 MCG SUBL Place 1 tablet under the tongue daily.  . ferrous sulfate  325 (65 FE) MG tablet Take 1 tablet (325 mg total) by mouth daily with breakfast.  . gabapentin (NEURONTIN) 300 MG capsule Take 1 capsule (300 mg total) by mouth at bedtime.  . hydrochlorothiazide (HYDRODIURIL) 25 MG tablet TAKE 1 TABLET(25 MG) BY MOUTH EVERY MORNING  . ibuprofen (ADVIL,MOTRIN) 200 MG tablet Take 200-400 mg by mouth every 6 (six) hours as needed for moderate pain.  . Ibuprofen-Famotidine 800-26.6 MG TABS TAKE 1 TABLET DAILY AS NEEDED.  [name] lisinopril (PRINIVIL,ZESTRIL) 40 MG tablet Take 1 tablet (40 mg total) by mouth every morning.  . Magnesium 100 MG CAPS Take 100 mg by mouth daily.  . potassium chloride (KLOR-CON) 8 MEQ tablet Take 2 tablets (16 mEq total) by mouth daily.  . TURMERIC PO  Take 1 tablet by mouth daily.  . diphenoxylate-atropine (LOMOTIL) 2.5-0.025 MG tablet Take 1 tablet by mouth 4 (four) times daily as needed for diarrhea or loose stools. (Patient not taking: Reported on 01/27/2016)  . HYDROcodone-homatropine (HYCODAN) 5-1.5 MG/5ML syrup Take 5 mLs by mouth every 8 (eight) hours as needed for cough. (Patient not taking: Reported on 01/27/2016)  . meloxicam (MOBIC) 15 MG tablet Take 1 tablet (15 mg total) by mouth daily. (Patient not taking: Reported on 01/27/2016)  . ondansetron (ZOFRAN ODT) 4 MG disintegrating tablet Take 1 tablet (4 mg total) by mouth every 8 (eight) hours as needed for nausea or vomiting. (Patient not taking: Reported on 01/27/2016)  . tiZANidine (ZANAFLEX) 4 MG tablet Take 1 tablet (4 mg total) by mouth Nightly. (Patient not taking: Reported on 01/27/2016)   No facility-administered encounter medications on file as of 01/27/2016.     Activities of Daily Living In your present state of health, do you have any difficulty performing the following activities: 01/27/2016  Hearing? N  Vision? N  Difficulty concentrating or making decisions? N  Walking or climbing stairs? N  Dressing or bathing? N  Doing errands, shopping? N  Preparing Food and eating ? N  Using the Toilet? N  In the past six months, have you accidently leaked urine? N  Do you have problems with loss of bowel control? N  Managing your Medications? N  Managing your Finances? N  Housekeeping or managing your Housekeeping? N  Some recent data might be hidden    Patient Care Team: [name], MD as PCP - General (Internal Medicine) Lyndal Pulley, DO as Attending Physician (Family Medicine)    Assessment:    Physical assessment deferred to PCP.  Exercise Activities and Dietary recommendations Current Exercise Habits: Structured exercise class, Time (Minutes): 45, Frequency (Times/Week): 2, Weekly Exercise (Minutes/Week): 90, Exercise limited by: None identified   Diet (meal  preparation, eat out, water intake, caffeinated beverages, dairy products, fruits and vegetables): Eats at home. Drinks herbal teas, boost, soda and occasional coffee.   Breakfast: grits, bacon, eggs, hash browns, boiled eggs, applesauce. Lunch: sandwich, left overs, or skips Dinner: wings, beans, meat, starch and vegetables. Rarely fried.       Discussed heart healthy diet, not skipping meals and increasing water intake.  Encouraged to continue water aerobics.   Goals      Patient Stated   . <enter goal here> (pt-stated)          Maintain current activity level.       Fall Risk Fall Risk  01/27/2016 09/08/2015 05/06/2014 02/15/2013 10/20/2012  Falls in the past year? Yes Yes Yes No Yes  Number falls in past yr: 2 or more  2 or more 1 - 2 or more  Injury with Fall? No Yes Yes - -  Risk Factor Category  - High Fall Risk High Fall Risk - High Fall Risk  Risk for fall due to : - History of fall(s);Impaired balance/gait - - Impaired balance/gait;Impaired mobility  Follow up Falls prevention discussed - - - -   Depression Screen PHQ 2/9 Scores 01/27/2016 09/08/2015 05/06/2014 02/15/2013  PHQ - 2 Score 0 0 0 0     Cognitive Function       Ad8 score reviewed for issues:  Issues making decisions: no  Less interest in hobbies / activities:no  Repeats questions, stories (family complaining):no  Trouble using ordinary gadgets (microwave, computer, phone):no  Forgets the month or year: no  Mismanaging finances: no  Remembering appts:no  Daily problems with thinking and/or memory:no Ad8 score is=0     Immunization History  Administered Date(s) Administered  . Influenza,inj,Quad PF,36+ Mos 11/12/2013, 11/27/2014  . Influenza-Unspecified 11/26/2015  . Pneumococcal Conjugate-13 04/30/2015  . Pneumococcal Polysaccharide-23 01/04/2014  . Tdap 07/29/2014   Screening Tests Health Maintenance  Topic Date Due  . ZOSTAVAX  03/13/2004  . MAMMOGRAM  05/27/2017  . COLONOSCOPY   06/08/2022  . TETANUS/TDAP  07/28/2024  . INFLUENZA VACCINE  Completed  . DEXA SCAN  Completed  . Hepatitis C Screening  Completed  . PNA vac Low Risk Adult  Completed      Plan:      Eat heart healthy diet (full of fruits, vegetables, whole grains, lean protein, water--limit salt, fat, and sugar intake) and increase physical activity as tolerated.  Continue doing brain stimulating activities (puzzles, reading, adult coloring books, staying active) to keep memory sharp.   Bring a copy of your advance directives to your next office visit.   During the course of the visit the patient was educated and counseled about the following appropriate screening and preventive services:   Vaccines to include Pneumoccal, Influenza, Hepatitis B, Td, Zostavax, HCV  Cardiovascular Disease  Colorectal cancer screening  Bone density screening  Diabetes screening  Glaucoma screening  Mammography/PAP  Nutrition counseling   Patient Instructions (the written plan) was given to the patient.   Gerilyn Nestle, RN  01/27/2016   Medical screening examination/treatment/procedure(s) were performed by non-physician practitioner and as supervising physician I was immediately available for consultation/collaboration. I agree with above. [name], MD

## 2016-01-27 ENCOUNTER — Ambulatory Visit (INDEPENDENT_AMBULATORY_CARE_PROVIDER_SITE_OTHER): Payer: Medicare Other

## 2016-01-27 VITALS — BP 126/70 | HR 99 | Resp 18 | Ht 65.0 in | Wt 170.0 lb

## 2016-01-27 DIAGNOSIS — Z0000 Encounter for general adult medical examination without abnormal findings: Secondary | ICD-10-CM

## 2016-01-27 NOTE — Patient Instructions (Addendum)
Eat heart healthy diet (full of fruits, vegetables, whole grains, lean protein, water--limit salt, fat, and sugar intake) and increase physical activity as tolerated.  Continue doing brain stimulating activities (puzzles, reading, adult coloring books, staying active) to keep memory sharp.   Bring a copy of your advance directives to your next office visit.  Fall Prevention in the Home Introduction Falls can cause injuries. They can happen to people of all ages. There are many things you can do to make your home safe and to help prevent falls. What can I do on the outside of my home?  Regularly fix the edges of walkways and driveways and fix any cracks.  Remove anything that might make you trip as you walk through a door, such as a raised step or threshold.  Trim any bushes or trees on the path to your home.  Use bright outdoor lighting.  Clear any walking paths of anything that might make someone trip, such as rocks or tools.  Regularly check to see if handrails are loose or broken. Make sure that both sides of any steps have handrails.  Any raised decks and porches should have guardrails on the edges.  Have any leaves, snow, or ice cleared regularly.  Use sand or salt on walking paths during winter.  Clean up any spills in your garage right away. This includes oil or grease spills. What can I do in the bathroom?  Use night lights.  Install grab bars by the toilet and in the tub and shower. Do not use towel bars as grab bars.  Use non-skid mats or decals in the tub or shower.  If you need to sit down in the shower, use a plastic, non-slip stool.  Keep the floor dry. Clean up any water that spills on the floor as soon as it happens.  Remove soap buildup in the tub or shower regularly.  Attach bath mats securely with double-sided non-slip rug tape.  Do not have throw rugs and other things on the floor that can make you trip. What can I do in the bedroom?  Use night  lights.  Make sure that you have a light by your bed that is easy to reach.  Do not use any sheets or blankets that are too big for your bed. They should not hang down onto the floor.  Have a firm chair that has side arms. You can use this for support while you get dressed.  Do not have throw rugs and other things on the floor that can make you trip. What can I do in the kitchen?  Clean up any spills right away.  Avoid walking on wet floors.  Keep items that you use a lot in easy-to-reach places.  If you need to reach something above you, use a strong step stool that has a grab bar.  Keep electrical cords out of the way.  Do not use floor polish or wax that makes floors slippery. If you must use wax, use non-skid floor wax.  Do not have throw rugs and other things on the floor that can make you trip. What can I do with my stairs?  Do not leave any items on the stairs.  Make sure that there are handrails on both sides of the stairs and use them. Fix handrails that are broken or loose. Make sure that handrails are as long as the stairways.  Check any carpeting to make sure that it is firmly attached to the stairs. Fix any   carpet that is loose or worn.  Avoid having throw rugs at the top or bottom of the stairs. If you do have throw rugs, attach them to the floor with carpet tape.  Make sure that you have a light switch at the top of the stairs and the bottom of the stairs. If you do not have them, ask someone to add them for you. What else can I do to help prevent falls?  Wear shoes that:  Do not have high heels.  Have rubber bottoms.  Are comfortable and fit you well.  Are closed at the toe. Do not wear sandals.  If you use a stepladder:  Make sure that it is fully opened. Do not climb a closed stepladder.  Make sure that both sides of the stepladder are locked into place.  Ask someone to hold it for you, if possible.  Clearly mark and make sure that you can  see:  Any grab bars or handrails.  First and last steps.  Where the edge of each step is.  Use tools that help you move around (mobility aids) if they are needed. These include:  Canes.  Walkers.  Scooters.  Crutches.  Turn on the lights when you go into a dark area. Replace any light bulbs as soon as they burn out.  Set up your furniture so you have a clear path. Avoid moving your furniture around.  If any of your floors are uneven, fix them.  If there are any pets around you, be aware of where they are.  Review your medicines with your doctor. Some medicines can make you feel dizzy. This can increase your chance of falling. Ask your doctor what other things that you can do to help prevent falls. This information is not intended to replace advice given to you by your health care provider. Make sure you discuss any questions you have with your health care provider. Document Released: 11/07/2008 Document Revised: 06/19/2015 Document Reviewed: 02/15/2014  2017 [employer]  Health Maintenance, Female Introduction Adopting a healthy lifestyle and getting preventive care can go a long way to promote health and wellness. Talk with your health care provider about what schedule of regular examinations is right for you. This is a good chance for you to check in with your provider about disease prevention and staying healthy. In between checkups, there are plenty of things you can do on your own. Experts have done a lot of research about which lifestyle changes and preventive measures are most likely to keep you healthy. Ask your health care provider for more information. Weight and diet Eat a healthy diet  Be sure to include plenty of vegetables, fruits, low-fat dairy products, and lean protein.  Do not eat a lot of foods high in solid fats, added sugars, or salt.  Get regular exercise. This is one of the most important things you can do for your health.  Most adults should exercise  for at least 150 minutes each week. The exercise should increase your heart rate and make you sweat (moderate-intensity exercise).  Most adults should also do strengthening exercises at least twice a week. This is in addition to the moderate-intensity exercise. Maintain a healthy weight  Body mass index (BMI) is a measurement that can be used to identify possible weight problems. It estimates body fat based on height and weight. Your health care provider can help determine your BMI and help you achieve or maintain a healthy weight.  For females 20 years of   age and older:  A BMI below 18.5 is considered underweight.  A BMI of 18.5 to 24.9 is normal.  A BMI of 25 to 29.9 is considered overweight.  A BMI of 30 and above is considered obese. Watch levels of cholesterol and blood lipids  You should start having your blood tested for lipids and cholesterol at 72 years of age, then have this test every 5 years.  You may need to have your cholesterol levels checked more often if:  Your lipid or cholesterol levels are high.  You are older than 72 years of age.  You are at high risk for heart disease. Cancer screening Lung Cancer  Lung cancer screening is recommended for adults 55-80 years old who are at high risk for lung cancer because of a history of smoking.  A yearly low-dose CT scan of the lungs is recommended for people who:  Currently smoke.  Have quit within the past 15 years.  Have at least a 30-pack-year history of smoking. A pack year is smoking an average of one pack of cigarettes a day for 1 year.  Yearly screening should continue until it has been 15 years since you quit.  Yearly screening should stop if you develop a health problem that would prevent you from having lung cancer treatment. Breast Cancer  Practice breast self-awareness. This means understanding how your breasts normally appear and feel.  It also means doing regular breast self-exams. Let your health  care provider know about any changes, no matter how small.  If you are in your 20s or 30s, you should have a clinical breast exam (CBE) by a health care provider every 1-3 years as part of a regular health exam.  If you are 40 or older, have a CBE every year. Also consider having a breast X-ray (mammogram) every year.  If you have a family history of breast cancer, talk to your health care provider about genetic screening.  If you are at high risk for breast cancer, talk to your health care provider about having an MRI and a mammogram every year.  Breast cancer gene (BRCA) assessment is recommended for women who have family members with BRCA-related cancers. BRCA-related cancers include:  Breast.  Ovarian.  Tubal.  Peritoneal cancers.  Results of the assessment will determine the need for genetic counseling and BRCA1 and BRCA2 testing. Cervical Cancer  Your health care provider may recommend that you be screened regularly for cancer of the pelvic organs (ovaries, uterus, and vagina). This screening involves a pelvic examination, including checking for microscopic changes to the surface of your cervix (Pap test). You may be encouraged to have this screening done every 3 years, beginning at age 21.  For women ages 30-65, health care providers may recommend pelvic exams and Pap testing every 3 years, or they may recommend the Pap and pelvic exam, combined with testing for human papilloma virus (HPV), every 5 years. Some types of HPV increase your risk of cervical cancer. Testing for HPV may also be done on women of any age with unclear Pap test results.  Other health care providers may not recommend any screening for nonpregnant women who are considered low risk for pelvic cancer and who do not have symptoms. Ask your health care provider if a screening pelvic exam is right for you.  If you have had past treatment for cervical cancer or a condition that could lead to cancer, you need Pap  tests and screening for cancer for at least   least 20 years after your treatment. If Pap tests have been discontinued, your risk factors (such as having a new sexual partner) need to be reassessed to determine if screening should resume. Some women have medical problems that increase the chance of getting cervical cancer. In these cases, your health care provider may recommend more frequent screening and Pap tests. Colorectal Cancer  This type of cancer can be detected and often prevented.  Routine colorectal cancer screening usually begins at 72 years of age and continues through 72 years of age.  Your health care provider may recommend screening at an earlier age if you have risk factors for colon cancer.  Your health care provider may also recommend using home test kits to check for hidden blood in the stool.  A small camera at the end of a tube can be used to examine your colon directly (sigmoidoscopy or colonoscopy). This is done to check for the earliest forms of colorectal cancer.  Routine screening usually begins at age 30.  Direct examination of the colon should be repeated every 5-10 years through 72 years of age. However, you may need to be screened more often if early forms of precancerous polyps or small growths are found. Skin Cancer  Check your skin from head to toe regularly.  Tell your health care provider about any new moles or changes in moles, especially if there is a change in a mole's shape or color.  Also tell your health care provider if you have a mole that is larger than the size of a pencil eraser.  Always use sunscreen. Apply sunscreen liberally and repeatedly throughout the day.  Protect yourself by wearing long sleeves, pants, a wide-brimmed hat, and sunglasses whenever you are outside. Heart disease, diabetes, and high blood pressure  High blood pressure causes heart disease and increases the risk of stroke. High blood pressure is more likely to develop  in:  People who have blood pressure in the high end of the normal range (130-139/85-89 mm Hg).  People who are overweight or obese.  People who are African American.  If you are 15-89 years of age, have your blood pressure checked every 3-5 years. If you are 77 years of age or older, have your blood pressure checked every year. You should have your blood pressure measured twice-once when you are at a hospital or clinic, and once when you are not at a hospital or clinic. Record the average of the two measurements. To check your blood pressure when you are not at a hospital or clinic, you can use:  An automated blood pressure machine at a pharmacy.  A home blood pressure monitor.  If you are between 65 years and 65 years old, ask your health care provider if you should take aspirin to prevent strokes.  Have regular diabetes screenings. This involves taking a blood sample to check your fasting blood sugar level.  If you are at a normal weight and have a low risk for diabetes, have this test once every three years after 72 years of age.  If you are overweight and have a high risk for diabetes, consider being tested at a younger age or more often. Preventing infection Hepatitis B  If you have a higher risk for hepatitis B, you should be screened for this virus. You are considered at high risk for hepatitis B if:  You were born in a country where hepatitis B is common. Ask your health care provider which countries are considered high  risk.  Your parents were born in a high-risk country, and you have not been immunized against hepatitis B (hepatitis B vaccine).  You have HIV or AIDS.  You use needles to inject street drugs.  You live with someone who has hepatitis B.  You have had sex with someone who has hepatitis B.  You get hemodialysis treatment.  You take certain medicines for conditions, including cancer, organ transplantation, and autoimmune conditions. Hepatitis C  Blood  testing is recommended for:  Everyone born from [birth year] through 1966.  Anyone with known risk factors for hepatitis C. Sexually transmitted infections (STIs)  You should be screened for sexually transmitted infections (STIs) including gonorrhea and chlamydia if:  You are sexually active and are younger than 72 years of age.  You are older than 72 years of age and your health care provider tells you that you are at risk for this type of infection.  Your sexual activity has changed since you were last screened and you are at an increased risk for chlamydia or gonorrhea. Ask your health care provider if you are at risk.  If you do not have HIV, but are at risk, it may be recommended that you take a prescription medicine daily to prevent HIV infection. This is called pre-exposure prophylaxis (PrEP). You are considered at risk if:  You are sexually active and do not regularly use condoms or know the HIV status of your partner(s).  You take drugs by injection.  You are sexually active with a partner who has HIV. Talk with your health care provider about whether you are at high risk of being infected with HIV. If you choose to begin PrEP, you should first be tested for HIV. You should then be tested every 3 months for as long as you are taking PrEP. Pregnancy  If you are premenopausal and you may become pregnant, ask your health care provider about preconception counseling.  If you may become pregnant, take 400 to 800 micrograms (mcg) of folic acid every day.  If you want to prevent pregnancy, talk to your health care provider about birth control (contraception). Osteoporosis and menopause  Osteoporosis is a disease in which the bones lose minerals and strength with aging. This can result in serious bone fractures. Your risk for osteoporosis can be identified using a bone density scan.  If you are 62 years of age or older, or if you are at risk for osteoporosis and fractures, ask your health  care provider if you should be screened.  Ask your health care provider whether you should take a calcium or vitamin D supplement to lower your risk for osteoporosis.  Menopause may have certain physical symptoms and risks.  Hormone replacement therapy may reduce some of these symptoms and risks. Talk to your health care provider about whether hormone replacement therapy is right for you. Follow these instructions at home:  Schedule regular health, dental, and eye exams.  Stay current with your immunizations.  Do not use any tobacco products including cigarettes, chewing tobacco, or electronic cigarettes.  If you are pregnant, do not drink alcohol.  If you are breastfeeding, limit how much and how often you drink alcohol.  Limit alcohol intake to no more than 1 drink per day for nonpregnant women. One drink equals 12 ounces of beer, 5 ounces of wine, or 1 ounces of hard liquor.  Do not use street drugs.  Do not share needles.  Ask your health care provider for help if you need  information about quitting drugs.  Tell your health care provider if you often feel depressed.  Tell your health care provider if you have ever been abused or do not feel safe at home. This information is not intended to replace advice given to you by your health care provider. Make sure you discuss any questions you have with your health care provider. Document Released: 07/27/2010 Document Revised: 06/19/2015 Document Reviewed: 10/15/2014  2017 Elsevier  

## 2016-02-04 DIAGNOSIS — H43813 Vitreous degeneration, bilateral: Secondary | ICD-10-CM | POA: Diagnosis not present

## 2016-02-04 DIAGNOSIS — H5203 Hypermetropia, bilateral: Secondary | ICD-10-CM | POA: Diagnosis not present

## 2016-02-04 DIAGNOSIS — H52223 Regular astigmatism, bilateral: Secondary | ICD-10-CM | POA: Diagnosis not present

## 2016-02-04 DIAGNOSIS — H524 Presbyopia: Secondary | ICD-10-CM | POA: Diagnosis not present

## 2016-03-03 ENCOUNTER — Other Ambulatory Visit (INDEPENDENT_AMBULATORY_CARE_PROVIDER_SITE_OTHER): Payer: Medicare Other

## 2016-03-03 ENCOUNTER — Ambulatory Visit (INDEPENDENT_AMBULATORY_CARE_PROVIDER_SITE_OTHER): Payer: Medicare Other | Admitting: Internal Medicine

## 2016-03-03 ENCOUNTER — Encounter: Payer: Self-pay | Admitting: Internal Medicine

## 2016-03-03 ENCOUNTER — Ambulatory Visit (INDEPENDENT_AMBULATORY_CARE_PROVIDER_SITE_OTHER)
Admission: RE | Admit: 2016-03-03 | Discharge: 2016-03-03 | Disposition: A | Discharge status: Home / Self Care | Payer: Medicare Other | Source: Ambulatory Visit | Attending: Internal Medicine | Admitting: Internal Medicine

## 2016-03-03 VITALS — BP 124/84 | HR 97 | Temp 98.4°F | Resp 16 | Wt 169.0 lb

## 2016-03-03 DIAGNOSIS — M810 Age-related osteoporosis without current pathological fracture: Secondary | ICD-10-CM

## 2016-03-03 DIAGNOSIS — R739 Hyperglycemia, unspecified: Secondary | ICD-10-CM | POA: Diagnosis not present

## 2016-03-03 DIAGNOSIS — E7800 Pure hypercholesterolemia, unspecified: Secondary | ICD-10-CM

## 2016-03-03 DIAGNOSIS — I10 Essential (primary) hypertension: Secondary | ICD-10-CM | POA: Diagnosis not present

## 2016-03-03 DIAGNOSIS — E876 Hypokalemia: Secondary | ICD-10-CM | POA: Diagnosis not present

## 2016-03-03 LAB — LIPID PANEL
CHOL/HDL RATIO: 3
Cholesterol: 171 mg/dL (ref 0–200)
HDL: 52.1 mg/dL (ref >39.00)
LDL CALC: 99 mg/dL (ref 0–99)
NONHDL: 118.9
Triglycerides: 100 mg/dL (ref 0.0–149.0)
VLDL: 20 mg/dL (ref 0.0–40.0)

## 2016-03-03 LAB — COMPREHENSIVE METABOLIC PANEL
ALT: 27 U/L (ref 0–35)
AST: 20 U/L (ref 0–37)
Albumin: 4.3 g/dL (ref 3.5–5.2)
Alkaline Phosphatase: 65 U/L (ref 39–117)
BILIRUBIN TOTAL: 0.4 mg/dL (ref 0.2–1.2)
BUN: 18 mg/dL (ref 6–23)
CHLORIDE: 106 meq/L (ref 96–112)
CO2: 27 meq/L (ref 19–32)
Calcium: 9.2 mg/dL (ref 8.4–10.5)
Creatinine, Ser: 0.72 mg/dL (ref 0.40–1.20)
GFR: 102.41 mL/min (ref >60.00)
GLUCOSE: 91 mg/dL (ref 70–99)
POTASSIUM: 3.9 meq/L (ref 3.5–5.1)
Sodium: 141 mEq/L (ref 135–145)
Total Protein: 7.1 g/dL (ref 6.0–8.3)

## 2016-03-03 LAB — HEMOGLOBIN A1C: HEMOGLOBIN A1C: 6.1 % (ref 4.6–6.5)

## 2016-03-03 MED ORDER — ATORVASTATIN CALCIUM 40 MG PO TABS: ORAL_TABLET | ORAL | 3 refills | Status: DC

## 2016-03-03 NOTE — Progress Notes (Signed)
Pre visit review using our clinic review tool, if applicable. No additional management support is needed unless otherwise documented below in the visit note. 

## 2016-03-03 NOTE — Assessment & Plan Note (Signed)
Not compliant with a low sugar diet Active, no regular exercise Check a1c

## 2016-03-03 NOTE — Assessment & Plan Note (Signed)
BP well controlled Current regimen effective and well tolerated Continue current medications at current doses cmp  

## 2016-03-03 NOTE — Assessment & Plan Note (Signed)
Check cmp 

## 2016-03-03 NOTE — Assessment & Plan Note (Signed)
Very active - encouraged regular exercise Taking calcium and vitamin d dexa due - ordered

## 2016-03-03 NOTE — Assessment & Plan Note (Signed)
Check lipid panel  Continue daily statin Regular exercise and healthy diet encouraged  

## 2016-03-03 NOTE — Progress Notes (Signed)
Subjective:    Patient ID: Kristina Ayala, female    DOB: Nov 19, 1945, 71 y.o.   MRN: [id number]  HPI The patient is here for follow up.  Hypertension: She is taking her medication daily. She is compliant with a low sodium diet.  She denies chest pain, palpitations, edema, shortness of breath and regular headaches. She is very active, but not exercising regularly.  She does not monitor her blood pressure at home.    Hyperlipidemia: She is taking her medication daily. She is compliant with a low fat/cholesterol diet. She is very active, but not exercising regularly. She denies myalgias.     Medications and allergies reviewed with patient and updated if appropriate.  Patient Active Problem List   Diagnosis Date Noted  . Bursitis of right shoulder 09/04/2015  . Lateral meniscus derangement 06/03/2015  . Tear of LCL (lateral collateral ligament) of knee 06/03/2015  . Lumbar radiculopathy 09/19/2014  . Low back pain 01/04/2014  . Bilateral knee pain 01/04/2014  . Diverticulosis of colon without hemorrhage 07/26/2013  . Diarrhea 07/26/2013  . Trigger thumb of left hand 03/06/2013  . Essential hypertension, benign 02/15/2013  . Hyperlipidemia 09/28/2012  . COPD (chronic obstructive pulmonary disease) ([place]) 05/22/2012  . Frequent falls 05/22/2012  . Osteoporosis 05/22/2012  . Hypokalemia 08/04/2011    Current Outpatient Prescriptions on File Prior to Visit  Medication Sig Dispense Refill  . acetaminophen (TYLENOL) 500 MG tablet Take 1,000 mg by mouth every 6 (six) hours as needed (For burn pain.).    [name] ALPRAZolam (XANAX) 0.5 MG tablet TAKE 1 TABLET BY MOUTH EVERY NIGHT AT BEDTIME AS NEEDED FOR ANXIETY 30 tablet 0  . amLODipine (NORVASC) 5 MG tablet TAKE 1 TABLET(5 MG) BY MOUTH DAILY 90 tablet 3  . atorvastatin (LIPITOR) 40 MG tablet TAKE 1 TABLET(40 MG) BY MOUTH EVERY MORNING (Patient taking differently: Take 20 mg by mouth. TAKE 1 TABLET(40 MG) BY MOUTH EVERY MORNING) 90  tablet 3  . calcium-vitamin D (OSCAL WITH D) 500-200 MG-UNIT per tablet Take 1 tablet by mouth daily with breakfast.     . cholecalciferol (VITAMIN D) 1000 UNITS tablet Take 1,000 Units by mouth daily.    . Cyanocobalamin (VITAMIN B-12) 2500 MCG SUBL Place 1 tablet under the tongue daily.    . ferrous sulfate 325 (65 FE) MG tablet Take 1 tablet (325 mg total) by mouth daily with breakfast. 30 tablet 3  . gabapentin (NEURONTIN) 300 MG capsule Take 1 capsule (300 mg total) by mouth at bedtime. 30 capsule 3  . hydrochlorothiazide (HYDRODIURIL) 25 MG tablet TAKE 1 TABLET(25 MG) BY MOUTH EVERY MORNING 90 tablet 3  . ibuprofen (ADVIL,MOTRIN) 200 MG tablet Take 200-400 mg by mouth every 6 (six) hours as needed for moderate pain.    . Ibuprofen-Famotidine 800-26.6 MG TABS TAKE 1 TABLET DAILY AS NEEDED. 270 tablet 3  . lisinopril (PRINIVIL,ZESTRIL) 40 MG tablet Take 1 tablet (40 mg total) by mouth every morning. 90 tablet 3  . Magnesium 100 MG CAPS Take 100 mg by mouth daily.    . meloxicam (MOBIC) 15 MG tablet Take 1 tablet (15 mg total) by mouth daily. 30 tablet 0  . potassium chloride (KLOR-CON) 8 MEQ tablet Take 2 tablets (16 mEq total) by mouth daily. 60 tablet 3  . tiZANidine (ZANAFLEX) 4 MG tablet Take 1 tablet (4 mg total) by mouth Nightly. 30 tablet 2  . TURMERIC PO Take 1 tablet by mouth daily.     No  current facility-administered medications on file prior to visit.     Past Medical History:  Diagnosis Date  . Arthritis   . Bursitis    left elbow  . Clostridium difficile infection   . Frequent falls   . HLD (hyperlipidemia)   . Hypertension   . Infectious colitis     Past Surgical History:  Procedure Laterality Date  . COLONOSCOPY    . ORIF PROXIMAL TIBIAL PLATEAU FRACTURE Left 02/19/2012  . POLYPECTOMY    . TONSILLECTOMY      Social History   Social History  . Marital status: Divorced    Spouse name: N/A  . Number of children: 2  . Years of education: N/A    Occupational History  . RETIRED    Social History Main Topics  . Smoking status: Former Smoker    Packs/day: 0.25    Years: 50.00    Types: Cigarettes    Quit date: 08/28/2014  . Smokeless tobacco: Never Used     Comment: 1 pack every 3 days  . Alcohol use No  . Drug use: No  . Sexual activity: Not Asked   Other Topics Concern  . None   Social History Narrative   Grandson (89 y/o) lives with patient.     Son lives in [place]   Daughter lives in [place]   She is originally from [place], [place]   Divorced             Family History  Problem Relation Age of Onset  . Diverticulosis Mother   . Ovarian cancer Mother   . Pancreatic cancer Father   . Lung cancer Father   . Colon polyps Sister   . Colon cancer Neg Hx     Review of Systems  Respiratory: Negative for cough, shortness of breath and wheezing.   Cardiovascular: Negative for chest pain, palpitations and leg swelling.  Gastrointestinal: Negative for abdominal pain, constipation and diarrhea.       No gerd  Neurological: Negative for light-headedness and headaches.       Objective:   Vitals:   03/03/16 1043  BP: 124/84  Pulse: 97  Resp: 16  Temp: 98.4 F (36.9 C)   Wt Readings from Last 3 Encounters:  03/03/16 169 lb (76.7 kg)  01/27/16 170 lb 0.6 oz (77.1 kg)  01/05/16 169 lb (76.7 kg)   Body mass index is 28.12 kg/m.   Physical Exam    Constitutional: Appears well-developed and well-nourished. No distress.  HENT:  Head: Normocephalic and atraumatic.  Neck: Neck supple. No tracheal deviation present. No thyromegaly present.  No cervical lymphadenopathy Cardiovascular: Normal rate, regular rhythm and normal heart sounds.   No murmur heard. No carotid bruit .  No edema Pulmonary/Chest: Effort normal and breath sounds normal. No respiratory distress. No has no wheezes. No rales.  Skin: Skin is warm and dry. Not diaphoretic.  Psychiatric: Normal mood and affect. Behavior is normal.       Assessment & Plan:    See Problem List for Assessment and Plan of chronic medical problems.    FU in 6 months

## 2016-03-03 NOTE — Patient Instructions (Addendum)

## 2016-03-31 ENCOUNTER — Telehealth: Payer: Self-pay | Admitting: Internal Medicine

## 2016-03-31 ENCOUNTER — Ambulatory Visit (INDEPENDENT_AMBULATORY_CARE_PROVIDER_SITE_OTHER): Payer: Medicare Other | Admitting: Internal Medicine

## 2016-03-31 ENCOUNTER — Encounter: Payer: Self-pay | Admitting: Internal Medicine

## 2016-03-31 VITALS — BP 146/86 | HR 99 | Temp 98.1°F | Resp 16 | Wt 168.0 lb

## 2016-03-31 DIAGNOSIS — M810 Age-related osteoporosis without current pathological fracture: Secondary | ICD-10-CM | POA: Diagnosis not present

## 2016-03-31 NOTE — Assessment & Plan Note (Addendum)
New diagnosis Discussed causes - she smoked for years, which likely contributed She is taking calcium and vitamin d daily - advised divided calcium up  Continue regular exercise - add walking /weight bearing exercise, possibly yoga /weights as well Discussed treatment options - if not expensive she would prefer prolia vs fosamax - will check into coverage Discussed possible side effects Discuss with dentist - has appt next month

## 2016-03-31 NOTE — Progress Notes (Signed)
Subjective:    Patient ID: Kristina Ayala, female    DOB: August 11, 1945, 71 y.o.   MRN: [id number]  HPI The patient is here for follow up of her recent bone density scan.  Osteoporosis:  she takes her calcium pill once daily - 1200 mg calcium a day, > 3000 units of vitamin d.  She is currently not exercising due to the weather and she about to start a new job.  Her job will be more active.  Normally she does water exercises as her main exercise.     She has GERD on occasion.  She broke her knee in three places years ago - it just gave out on her - there was no trauma.    Medications and allergies reviewed with patient and updated if appropriate.  Patient Active Problem List   Diagnosis Date Noted  . Hyperglycemia 03/03/2016  . Bursitis of right shoulder 09/04/2015  . Lateral meniscus derangement 06/03/2015  . Tear of LCL (lateral collateral ligament) of knee 06/03/2015  . Lumbar radiculopathy 09/19/2014  . Low back pain 01/04/2014  . Bilateral knee pain 01/04/2014  . Diverticulosis of colon without hemorrhage 07/26/2013  . Trigger thumb of left hand 03/06/2013  . Essential hypertension, benign 02/15/2013  . Hyperlipidemia 09/28/2012  . COPD (chronic obstructive pulmonary disease) ([place]) 05/22/2012  . Frequent falls 05/22/2012  . Osteoporosis 05/22/2012  . Hypokalemia 08/04/2011    Current Outpatient Prescriptions on File Prior to Visit  Medication Sig Dispense Refill  . acetaminophen (TYLENOL) 500 MG tablet Take 1,000 mg by mouth every 6 (six) hours as needed (For burn pain.).    [name] ALPRAZolam (XANAX) 0.5 MG tablet TAKE 1 TABLET BY MOUTH EVERY NIGHT AT BEDTIME AS NEEDED FOR ANXIETY 30 tablet 0  . amLODipine (NORVASC) 5 MG tablet TAKE 1 TABLET(5 MG) BY MOUTH DAILY 90 tablet 3  . atorvastatin (LIPITOR) 40 MG tablet TAKE 1 TABLET(40 MG) BY MOUTH EVERY OTHER DAY 90 tablet 3  . calcium-vitamin D (OSCAL WITH D) 500-200 MG-UNIT per tablet Take 1 tablet by mouth daily with  breakfast.     . cholecalciferol (VITAMIN D) 1000 UNITS tablet Take 1,000 Units by mouth daily.    . Cyanocobalamin (VITAMIN B-12) 2500 MCG SUBL Place 1 tablet under the tongue daily.    . ferrous sulfate 325 (65 FE) MG tablet Take 1 tablet (325 mg total) by mouth daily with breakfast. 30 tablet 3  . gabapentin (NEURONTIN) 300 MG capsule Take 1 capsule (300 mg total) by mouth at bedtime. 30 capsule 3  . hydrochlorothiazide (HYDRODIURIL) 25 MG tablet TAKE 1 TABLET(25 MG) BY MOUTH EVERY MORNING 90 tablet 3  . ibuprofen (ADVIL,MOTRIN) 200 MG tablet Take 200-400 mg by mouth every 6 (six) hours as needed for moderate pain.    . Ibuprofen-Famotidine 800-26.6 MG TABS TAKE 1 TABLET DAILY AS NEEDED. 270 tablet 3  . lisinopril (PRINIVIL,ZESTRIL) 40 MG tablet Take 1 tablet (40 mg total) by mouth every morning. 90 tablet 3  . Magnesium 100 MG CAPS Take 100 mg by mouth daily.    . meloxicam (MOBIC) 15 MG tablet Take 1 tablet (15 mg total) by mouth daily. 30 tablet 0  . potassium chloride (KLOR-CON) 8 MEQ tablet Take 2 tablets (16 mEq total) by mouth daily. 60 tablet 3  . tiZANidine (ZANAFLEX) 4 MG tablet Take 1 tablet (4 mg total) by mouth Nightly. 30 tablet 2  . TURMERIC PO Take 1 tablet by mouth daily.  No current facility-administered medications on file prior to visit.     Past Medical History:  Diagnosis Date  . Arthritis   . Bursitis    left elbow  . Clostridium difficile infection   . Frequent falls   . HLD (hyperlipidemia)   . Hypertension   . Infectious colitis     Past Surgical History:  Procedure Laterality Date  . COLONOSCOPY    . ORIF PROXIMAL TIBIAL PLATEAU FRACTURE Left 02/19/2012  . POLYPECTOMY    . TONSILLECTOMY      Social History   Social History  . Marital status: Divorced    Spouse name: N/A  . Number of children: 2  . Years of education: N/A   Occupational History  . RETIRED    Social History Main Topics  . Smoking status: Former Smoker    Packs/day: 0.25     Years: 50.00    Types: Cigarettes    Quit date: 08/28/2014  . Smokeless tobacco: Never Used     Comment: 1 pack every 3 days  . Alcohol use No  . Drug use: No  . Sexual activity: Not Asked   Other Topics Concern  . None   Social History Narrative   Grandson (39 y/o) lives with patient.     Son lives in [place]   Daughter lives in [place]   She is originally from [place], [place]   Divorced             Family History  Problem Relation Age of Onset  . Diverticulosis Mother   . Ovarian cancer Mother   . Pancreatic cancer Father   . Lung cancer Father   . Colon polyps Sister   . Colon cancer Neg Hx     Review of Systems     Objective:   Vitals:   03/31/16 1541  BP: (!) 146/86  Pulse: 99  Resp: 16  Temp: 98.1 F (36.7 C)   Wt Readings from Last 3 Encounters:  03/31/16 168 lb (76.2 kg)  03/03/16 169 lb (76.7 kg)  01/27/16 170 lb 0.6 oz (77.1 kg)   Body mass index is 27.96 kg/m.   Physical Exam         Assessment & Plan:    See Problem List for Assessment and Plan of chronic medical problems.    20 minutes were spent face-to-face with the patient, over 50% of which was spent counseling regarding osteoporosis including causes, treatment - calcium, vitamin d, exercise and medication.

## 2016-03-31 NOTE — Progress Notes (Signed)
Pre visit review using our clinic review tool, if applicable. No additional management support is needed unless otherwise documented below in the visit note. 

## 2016-03-31 NOTE — Patient Instructions (Addendum)
Osteoporosis Osteoporosis is the thinning and loss of density in the bones. Osteoporosis makes the bones more brittle, fragile, and likely to break (fracture). Over time, osteoporosis can cause the bones to become so weak that they fracture after a simple fall. The bones most likely to fracture are the bones in the hip, wrist, and spine. What are the causes? The exact cause is not known. What increases the risk? Anyone can develop osteoporosis. You may be at greater risk if you have a family history of the condition or have poor nutrition. You may also have a higher risk if you are:  Female.  16 years old or older.  A smoker.  Not physically active.  White or Asian.  Slender. What are the signs or symptoms? A fracture might be the first sign of the disease, especially if it results from a fall or injury that would not usually cause a bone to break. Other signs and symptoms include:  Low back and neck pain.  Stooped posture.  Height loss. How is this diagnosed? To make a diagnosis, your health care provider may:  Take a medical history.  Perform a physical exam.  Order tests, such as:  A bone mineral density test.  A dual-energy X-ray absorptiometry test. How is this treated? The goal of osteoporosis treatment is to strengthen your bones to reduce your risk of a fracture. Treatment may involve:  Making lifestyle changes, such as:  Eating a diet rich in calcium.  Doing weight-bearing and muscle-strengthening exercises.  Stopping tobacco use.  Limiting alcohol intake.  Taking medicine to slow the process of bone loss or to increase bone density.  Monitoring your levels of calcium and vitamin D. Follow these instructions at home:  Include calcium and vitamin D in your diet. Calcium is important for bone health, and vitamin D helps the body absorb calcium.  Perform weight-bearing and muscle-strengthening exercises as directed by your health care  provider.  Do not use any tobacco products, including cigarettes, chewing tobacco, and electronic cigarettes. If you need help quitting, ask your health care provider.  Limit your alcohol intake.  Take medicines only as directed by your health care provider.  Keep all follow-up visits as directed by your health care provider. This is important.  Take precautions at home to lower your risk of falling, such as:  Keeping rooms well lit and clutter free.  Installing safety rails on stairs.  Using rubber mats in the bathroom and other areas that are often wet or slippery. Get help right away if: You fall or injure yourself. This information is not intended to replace advice given to you by your health care provider. Make sure you discuss any questions you have with your health care provider. Document Released: 10/21/2004 Document Revised: 06/16/2015 Document Reviewed: 06/21/2013 Elsevier Interactive Patient Education  2017 [place].   Denosumab injection (prolia) What is this medicine? DENOSUMAB (den oh sue mab) slows bone breakdown. Prolia is used to treat osteoporosis in women after menopause and in men. Delton See is used to treat a high calcium level due to cancer and to prevent bone fractures and other bone problems caused by multiple myeloma or cancer bone metastases. Delton See is also used to treat giant cell tumor of the bone. This medicine may be used for other purposes; ask your health care provider or pharmacist if you have questions. COMMON BRAND NAME(S): Prolia, XGEVA What should I tell my health care provider before I take this medicine? They need  to know if you have any of these conditions: -dental disease -having surgery or tooth extraction -infection -kidney disease -low levels of calcium or Vitamin D in the blood -malnutrition -on hemodialysis -skin conditions or sensitivity -thyroid or parathyroid disease -an unusual reaction to denosumab, other medicines, foods,  dyes, or preservatives -pregnant or trying to get pregnant -breast-feeding How should I use this medicine? This medicine is for injection under the skin. It is given by a health care professional in a hospital or clinic setting. If you are getting Prolia, a special MedGuide will be given to you by the pharmacist with each prescription and refill. Be sure to read this information carefully each time. For Prolia, talk to your pediatrician regarding the use of this medicine in children. Special care may be needed. For Delton See, talk to your pediatrician regarding the use of this medicine in children. While this drug may be prescribed for children as young as 13 years for selected conditions, precautions do apply. Overdosage: If you think you have taken too much of this medicine contact a poison control center or emergency room at once. NOTE: This medicine is only for you. Do not share this medicine with others. What if I miss a dose? It is important not to miss your dose. Call your doctor or health care professional if you are unable to keep an appointment. What may interact with this medicine? Do not take this medicine with any of the following medications: -other medicines containing denosumab This medicine may also interact with the following medications: -medicines that lower your chance of fighting infection -steroid medicines like prednisone or cortisone This list may not describe all possible interactions. Give your health care provider a list of all the medicines, herbs, non-prescription drugs, or dietary supplements you use. Also tell them if you smoke, drink alcohol, or use illegal drugs. Some items may interact with your medicine. What should I watch for while using this medicine? Visit your doctor or health care professional for regular checks on your progress. Your doctor or health care professional may order blood tests and other tests to see how you are doing. Call your doctor or health  care professional for advice if you get a fever, chills or sore throat, or other symptoms of a cold or flu. Do not treat yourself. This drug may decrease your body's ability to fight infection. Try to avoid being around people who are sick. You should make sure you get enough calcium and vitamin D while you are taking this medicine, unless your doctor tells you not to. Discuss the foods you eat and the vitamins you take with your health care professional. See your dentist regularly. Brush and floss your teeth as directed. Before you have any dental work done, tell your dentist you are receiving this medicine. Do not become pregnant while taking this medicine or for 5 months after stopping it. Talk with your doctor or health care professional about your birth control options while taking this medicine. Women should inform their doctor if they wish to become pregnant or think they might be pregnant. There is a potential for serious side effects to an unborn child. Talk to your health care professional or pharmacist for more information. What side effects may I notice from receiving this medicine? Side effects that you should report to your doctor or health care professional as soon as possible: -allergic reactions like skin rash, itching or hives, swelling of the face, lips, or tongue -bone pain -breathing problems -dizziness -jaw  pain, especially after dental work -redness, blistering, peeling of the skin -signs and symptoms of infection like fever or chills; cough; sore throat; pain or trouble passing urine -signs of low calcium like fast heartbeat, muscle cramps or muscle pain; pain, tingling, numbness in the hands or feet; seizures -unusual bleeding or bruising -unusually weak or tired Side effects that usually do not require medical attention (report to your doctor or health care professional if they continue or are bothersome): -constipation -diarrhea -headache -joint pain -loss of  appetite -muscle pain -runny nose -tiredness -upset stomach This list may not describe all possible side effects. Call your doctor for medical advice about side effects. You may report side effects to FDA at 1-800-FDA-1088. Where should I keep my medicine? This medicine is only given in a clinic, doctor's office, or other health care setting and will not be stored at home. NOTE: This sheet is a summary. It may not cover all possible information. If you have questions about this medicine, talk to your doctor, pharmacist, or health care provider.  2018 Elsevier/Gold Standard (2016-02-03 19:17:21)    Alendronate tablets (fosamax) What is this medicine? ALENDRONATE (a LEN droe nate) slows calcium loss from bones. It helps to make normal healthy bone and to slow bone loss in people with Paget's disease and osteoporosis. It may be used in others at risk for bone loss. This medicine may be used for other purposes; ask your health care provider or pharmacist if you have questions. COMMON BRAND NAME(S): Fosamax What should I tell my health care provider before I take this medicine? They need to know if you have any of these conditions: -dental disease -esophagus, stomach, or intestine problems, like acid reflux or GERD -kidney disease -low blood calcium -low vitamin D -problems sitting or standing 30 minutes -trouble swallowing -an unusual or allergic reaction to alendronate, other medicines, foods, dyes, or preservatives -pregnant or trying to get pregnant -breast-feeding How should I use this medicine? You must take this medicine exactly as directed or you will lower the amount of the medicine you absorb into your body or you may cause yourself harm. Take this medicine by mouth first thing in the morning, after you are up for the day. Do not eat or drink anything before you take your medicine. Swallow the tablet with a full glass (6 to 8 fluid ounces) of plain water. Do not take this  medicine with any other drink. Do not chew or crush the tablet. After taking this medicine, do not eat breakfast, drink, or take any medicines or vitamins for at least 30 minutes. Sit or stand up for at least 30 minutes after you take this medicine; do not lie down. Do not take your medicine more often than directed. Talk to your pediatrician regarding the use of this medicine in children. Special care may be needed. Overdosage: If you think you have taken too much of this medicine contact a poison control center or emergency room at once. NOTE: This medicine is only for you. Do not share this medicine with others. What if I miss a dose? If you miss a dose, do not take it later in the day. Continue your normal schedule starting the next morning. Do not take double or extra doses. What may interact with this medicine? -aluminum hydroxide -antacids -aspirin -calcium supplements -drugs for inflammation like ibuprofen, naproxen, and others -iron supplements -magnesium supplements -vitamins with minerals This list may not describe all possible interactions. Give your health care provider a  list of all the medicines, herbs, non-prescription drugs, or dietary supplements you use. Also tell them if you smoke, drink alcohol, or use illegal drugs. Some items may interact with your medicine. What should I watch for while using this medicine? Visit your doctor or health care professional for regular checks ups. It may be some time before you see benefit from this medicine. Do not stop taking your medicine except on your doctor's advice. Your doctor or health care professional may order blood tests and other tests to see how you are doing. You should make sure you get enough calcium and vitamin D while you are taking this medicine, unless your doctor tells you not to. Discuss the foods you eat and the vitamins you take with your health care professional. Some people who take this medicine have severe bone,  joint, and/or muscle pain. This medicine may also increase your risk for a broken thigh bone. Tell your doctor right away if you have pain in your upper leg or groin. Tell your doctor if you have any pain that does not go away or that gets worse. This medicine can make you more sensitive to the sun. If you get a rash while taking this medicine, sunlight may cause the rash to get worse. Keep out of the sun. If you cannot avoid being in the sun, wear protective clothing and use sunscreen. Do not use sun lamps or tanning beds/booths. What side effects may I notice from receiving this medicine? Side effects that you should report to your doctor or health care professional as soon as possible: -allergic reactions like skin rash, itching or hives, swelling of the face, lips, or tongue -black or tarry stools -bone, muscle or joint pain -changes in vision -chest pain -heartburn or stomach pain -jaw pain, especially after dental work -pain or trouble when swallowing -redness, blistering, peeling or loosening of the skin, including inside the mouth Side effects that usually do not require medical attention (report to your doctor or health care professional if they continue or are bothersome): -changes in taste -diarrhea or constipation -eye pain or itching -headache -nausea or vomiting -stomach gas or fullness This list may not describe all possible side effects. Call your doctor for medical advice about side effects. You may report side effects to FDA at 1-800-FDA-1088. Where should I keep my medicine? Keep out of the reach of children. Store at room temperature of 15 and 30 degrees C (59 and 86 degrees F). Throw away any unused medicine after the expiration date. NOTE: This sheet is a summary. It may not cover all possible information. If you have questions about this medicine, talk to your doctor, pharmacist, or health care provider.  2018 Elsevier/Gold Standard (2010-07-10 08:56:09)

## 2016-03-31 NOTE — Telephone Encounter (Signed)
Has OP - interested in prolia.  No previous treatment.  Has occasional gerd.  Has had knee fracture. No other fracture.

## 2016-04-01 NOTE — Telephone Encounter (Signed)
Kristina Ayala will be checking summary of benefits for prolia from patient's insurance plan and will call patient with findings----any questions, can talk with [name]

## 2016-04-12 ENCOUNTER — Encounter: Payer: Self-pay | Admitting: Family Medicine

## 2016-04-12 ENCOUNTER — Ambulatory Visit: Payer: Self-pay

## 2016-04-12 ENCOUNTER — Ambulatory Visit (INDEPENDENT_AMBULATORY_CARE_PROVIDER_SITE_OTHER): Payer: Medicare Other | Admitting: Family Medicine

## 2016-04-12 VITALS — BP 136/84 | HR 96 | Ht 65.0 in | Wt 169.0 lb

## 2016-04-12 DIAGNOSIS — M7551 Bursitis of right shoulder: Secondary | ICD-10-CM | POA: Diagnosis not present

## 2016-04-12 DIAGNOSIS — M25511 Pain in right shoulder: Secondary | ICD-10-CM | POA: Diagnosis not present

## 2016-04-12 NOTE — Progress Notes (Signed)
[facility] [place], [place] [address] Phone: [phone] Subjective:    I'm seeing this patient by the request  of:  [name], MD   CC: Shoulder pain follow-up  BTD:[id number]  Kristina Ayala is a 71 y.o. female coming in with complaint of neck pain patient states this seems to be more on the right shoulder. Patient was seen before for more of a bursitis. Was given an injection greater than 7 months ago. States that this feels very similar again. Starting to affect daily activities such as even dressing or sleeping. Patient states very mild radiation down the arm but never passer elbow. Denies any true weakness but states that she is less and less secondary to the pain.    Previous neck MRI and did 08/02/2012 showed mild osteophytic changes with a small central protrusion at C3-4, C4-5 without central canal stenosis Past Medical History:  Diagnosis Date  . Arthritis   . Bursitis    left elbow  . Clostridium difficile infection   . Frequent falls   . HLD (hyperlipidemia)   . Hypertension   . Infectious colitis    Past Surgical History:  Procedure Laterality Date  . COLONOSCOPY    . ORIF PROXIMAL TIBIAL PLATEAU FRACTURE Left 02/19/2012  . POLYPECTOMY    . TONSILLECTOMY     Social History   Social History  . Marital status: Divorced    Spouse name: N/A  . Number of children: 2  . Years of education: N/A   Occupational History  . RETIRED    Social History Main Topics  . Smoking status: Former Smoker    Packs/day: 0.25    Years: 50.00    Types: Cigarettes    Quit date: 08/28/2014  . Smokeless tobacco: Never Used     Comment: 1 pack every 3 days  . Alcohol use No  . Drug use: No  . Sexual activity: Not Asked   Other Topics Concern  . None   Social History Narrative   Grandson (64 y/o) lives with patient.     Son lives in [place]   Daughter lives in [place]   She is originally from [place], [place]   Divorced              Allergies  Allergen Reactions  . Doxycycline Diarrhea  . Minocycline Diarrhea  . Wellbutrin [Bupropion] Swelling    Per pt her tongue was swollen and sore/symptoms stopped once the medication was discontinued.   [name] Penicillins Rash    Has patient had a PCN reaction causing immediate rash, facial/tongue/throat swelling, SOB or lightheadedness with hypotension: yes Has patient had a PCN reaction causing severe rash involving mucus membranes or skin necrosis: no Has patient had a PCN reaction that required hospitalization: unknown Has patient had a PCN reaction occurring within the last 10 years: no If all of the above answers are "NO", then may proceed with Cephalosporin use.    Family History  Problem Relation Age of Onset  . Diverticulosis Mother   . Ovarian cancer Mother   . Pancreatic cancer Father   . Lung cancer Father   . Colon polyps Sister   . Colon cancer Neg Hx     Past medical history, social, surgical and family history all reviewed in electronic medical record.  No pertanent information unless stated regarding to the chief complaint.   Review of Systems: No headache, visual changes, nausea, vomiting, diarrhea, constipation, dizziness, abdominal pain,  skin rash, fevers, chills, night sweats, weight loss, swollen lymph nodes,chest pain, shortness of breath, mood changes.  Positive muscle aches  Objective  Blood pressure 136/84, pulse 96, height 5\' 5"  (1.651 m), weight 169 lb (76.7 kg), SpO2 98 %.  Systems examined below as of 04/12/16 General: NAD A&O x3 mood, affect normal  HEENT: Pupils equal, extraocular movements intact no nystagmus Respiratory: not short of breath at rest or with speaking Cardiovascular: No lower extremity edema, non tender Skin: Warm dry intact with no signs of infection or rash on extremities or on axial skeleton. Abdomen: Soft nontender, no masses Neuro: Cranial nerves  intact, neurovascularly intact in all extremities with 2+  DTRs and 2+ pulses. Lymph: No lymphadenopathy appreciated today  Gait normal with good balance and coordination.  MSK: Non tender with full range of motion and good stability and symmetric strength and tone of shoulders, elbows, wrist,  knee hips and ankles bilaterally.  Mild arthritic changes of multiple joints Neck: Inspection unremarkable. No palpable stepoffs. Negative Spurling's maneuver. Very mild tightness noted with right-sided side bending but otherwise normal Grip strength and sensation normal in bilateral hands Strength good C4 to T1 distribution No sensory change to C4 to T1 Negative Hoffman sign bilaterally Reflexes normal  Shoulder: Right Inspection reveals no abnormalities, atrophy or asymmetry. Palpation is normal with no tenderness over AC joint or bicipital groove. ROM is full in all planes passively. Rotator cuff strength normal throughout. signs of impingement with positive Neer and Hawkin's tests, but negative empty can sign. Speeds and Yergason's tests normal. No labral pathology noted with negative Obrien's, negative clunk and good stability. Normal scapular function observed. No painful arc and no drop arm sign. No apprehension sign Contralateral shoulder unremarkable  Procedure: Real-time Ultrasound Guided Injection of right glenohumeral joint Device: GE Logiq E  Ultrasound guided injection is preferred based studies that show increased duration, increased effect, greater accuracy, decreased procedural pain, increased response rate with ultrasound guided versus blind injection.  Verbal informed consent obtained.  Time-out conducted.  Noted no overlying erythema, induration, or other signs of local infection.  Skin prepped in a sterile fashion.  Local anesthesia: Topical Ethyl chloride.  With sterile technique and under real time ultrasound guidance:  Joint visualized.  23g 1  inch needle inserted posterior approach. Pictures taken for needle placement.  Patient did have injection of 2 cc of 1% lidocaine, 2 cc of 0.5% Marcaine, and 1.0 cc of Kenalog 40 mg/dL. Completed without difficulty  Pain immediately resolved suggesting accurate placement of the medication.  Advised to call if fevers/chills, erythema, induration, drainage, or persistent bleeding.  Images permanently stored and available for review in the ultrasound unit.  Impression: Technically successful ultrasound guided injection.     Impression and Recommendations:     This case required medical decision making of moderate complexity.      Note: This dictation was prepared with Dragon dictation along with smaller phrase technology. Any transcriptional errors that result from this process are unintentional.

## 2016-04-12 NOTE — Patient Instructions (Signed)
Good to see you  Kristina Ayala is your friend.  Try the exercises again 2-3 times a week.  You know the drill  See me again in 4 weeks if not better.

## 2016-04-12 NOTE — Assessment & Plan Note (Signed)
Patient was given an injection and tolerated the procedure well. We discussed icing regimen, home exercises, which activities to do a which was to avoid. Patient is to increase activity slowly over the course of time. Patient given more topical anti-inflammatories because that had been beneficial. Has oral anti-inflammatories and a muscle relaxer for breakthrough pain.. Discussed the possibility of formal physical therapy but patient declined.

## 2016-04-15 ENCOUNTER — Telehealth: Payer: Self-pay

## 2016-04-15 NOTE — Telephone Encounter (Signed)
Left message advising patient to call back, I have verified insurance for prolia injections, with estimated $225 copay ----can talk with Tarvaris Puglia to discuss

## 2016-04-16 ENCOUNTER — Other Ambulatory Visit: Payer: Self-pay | Admitting: Internal Medicine

## 2016-04-16 DIAGNOSIS — Z1231 Encounter for screening mammogram for malignant neoplasm of breast: Secondary | ICD-10-CM

## 2016-04-16 NOTE — Telephone Encounter (Signed)
Patient advised of copay---patient would like for Kristina Ayala to see if there's any funding available from a patient assistance program----I will check with dan/prolia week of 3/26 to see what funds are available and call patient back

## 2016-05-05 ENCOUNTER — Other Ambulatory Visit: Payer: Self-pay | Admitting: Internal Medicine

## 2016-05-10 ENCOUNTER — Ambulatory Visit: Payer: Medicare Other | Admitting: Family Medicine

## 2016-05-13 ENCOUNTER — Telehealth: Payer: Self-pay

## 2016-05-13 NOTE — Telephone Encounter (Signed)
Left message advising that patient assistance funding is available for prolia injections through [employer] ([phone])---patient needs to call to see if she qualifies and then call tamara back with findings

## 2016-05-16 ENCOUNTER — Other Ambulatory Visit: Payer: Self-pay | Admitting: Internal Medicine

## 2016-05-17 NOTE — Telephone Encounter (Signed)
Pt called back. °

## 2016-05-17 NOTE — Telephone Encounter (Signed)
Patient stated she has been approved for assistance funding, patient advised when she gets copay card in mail, she can call back to schedule nurse visit-anytime at her convenience--patient's copay is $225 and can be paid for by patient assistance card she brings to office---see Ashden Sonnenberg if any questions

## 2016-05-26 ENCOUNTER — Ambulatory Visit
Admission: RE | Admit: 2016-05-26 | Discharge: 2016-05-26 | Disposition: A | Discharge status: Home / Self Care | Payer: Medicare Other | Source: Ambulatory Visit | Attending: Internal Medicine | Admitting: Internal Medicine

## 2016-05-26 DIAGNOSIS — Z1231 Encounter for screening mammogram for malignant neoplasm of breast: Secondary | ICD-10-CM | POA: Diagnosis not present

## 2016-06-01 ENCOUNTER — Other Ambulatory Visit: Payer: Self-pay | Admitting: Internal Medicine

## 2016-06-01 DIAGNOSIS — L700 Acne vulgaris: Secondary | ICD-10-CM | POA: Diagnosis not present

## 2016-06-01 DIAGNOSIS — D1801 Hemangioma of skin and subcutaneous tissue: Secondary | ICD-10-CM | POA: Diagnosis not present

## 2016-06-01 DIAGNOSIS — L821 Other seborrheic keratosis: Secondary | ICD-10-CM | POA: Diagnosis not present

## 2016-06-02 ENCOUNTER — Ambulatory Visit: Payer: Medicare Other

## 2016-06-02 ENCOUNTER — Telehealth: Payer: Self-pay

## 2016-06-02 MED ORDER — DENOSUMAB 60 MG/ML ~~LOC~~ SOLN: 60.0000 mg | Freq: Once | SUBCUTANEOUS | 1 refills | Status: AC

## 2016-06-02 NOTE — Telephone Encounter (Signed)
Since patient has called back in and scheduled nurse visit for today to get prolia, patient should have received a letter from [employer] being approved for funding to pay for copay costs--I have tried to call patient to ask her to bring letter of approval for funding----patient can either pay her copay today and we can send in for reimbursement OR we can use pharmacy copay card that's on patient's letter to order thru [facility], which will have her prolia med/syringe [facility] with $0 copay to patient----when patient arrives today, she can talk with tamara

## 2016-06-02 NOTE — Telephone Encounter (Signed)
Patient would like to use pharmacy card benefit from [employer]---I have sent rx [facility] to [facility] and also faxed pharmacy card copy over to them per stefannie/pharmacy ---[facility] will order prolia injection and send to our office---our office will contact patient to come in for injection after med is recd----no copay from customer is needed---

## 2016-06-15 NOTE — Telephone Encounter (Signed)
I have talked with [facility] pharmacy, med is there for patient to pick up---patient needs to take her pharmacy card from [place] patient assistance funding program and use that card to pay for med---med needs to be brought to our office so that we can administer injection to patient---if patient is not coming right away, keep med refrigerated---patient should call back to schedule nurse visit after she picks up med and let us know she is on way with med---can talk with Mckenzee Beem if any questions

## 2016-06-15 NOTE — Telephone Encounter (Signed)
Pt called regarding her Prolia injection states she only has 30 days to use her benefit card

## 2016-06-16 NOTE — Telephone Encounter (Signed)
Kristina Ayala can you call patient back. Patient states that you have her [place] program card. She would like you mail it to her. She states it waste gas to drive her and get it from you. ??

## 2016-06-17 NOTE — Telephone Encounter (Signed)
Pt states she gave [name] her Rx insurance card and needs it back. [facility] has told her they can get her Prolia and she will get it there if they will do it. Please call pt. Thanks.  Pt has gotten everything approved. She called [facility] wanting to get the injection here. Advised pt she would need to go through the [facility] office due to the nature of the script. (Prolia)

## 2016-06-17 NOTE — Telephone Encounter (Signed)
Patient came into office waiting to pick up her card. [name] is out of office. Patient has been informed and was informed yesterday to wait until [name] came in to and gave her a call. Patient has set a hold on time on the nurse sch for May 30 to get the prolia injection. Patient was informed once [name] got into office she would give her a call. Thank you.

## 2016-06-17 NOTE — Telephone Encounter (Signed)
I have called [facility] and talked with amanda/pharmacy----I have given all information from [place] to her, she has ran medication thru using those numbers, and med is $0 cost for patient to pick up at pharm----I left message on patient's cell phone advising she can pick up at [facility] and bring to our office for administration

## 2016-06-23 ENCOUNTER — Ambulatory Visit (INDEPENDENT_AMBULATORY_CARE_PROVIDER_SITE_OTHER): Payer: Medicare Other

## 2016-06-23 DIAGNOSIS — M810 Age-related osteoporosis without current pathological fracture: Secondary | ICD-10-CM | POA: Diagnosis not present

## 2016-06-23 MED ORDER — DENOSUMAB 60 MG/ML ~~LOC~~ SOLN
60.0000 mg | Freq: Once | SUBCUTANEOUS | Status: AC
  Administered 2016-06-23: 60 mg via SUBCUTANEOUS

## 2016-06-23 NOTE — Progress Notes (Signed)
prolia Injection given.   Attie Nawabi J Bera Pinela, MD  

## 2016-06-26 ENCOUNTER — Other Ambulatory Visit: Payer: Self-pay | Admitting: Internal Medicine

## 2016-07-25 ENCOUNTER — Emergency Department (HOSPITAL_COMMUNITY): Payer: Medicare Other

## 2016-07-25 ENCOUNTER — Encounter (HOSPITAL_COMMUNITY): Payer: Self-pay

## 2016-07-25 ENCOUNTER — Emergency Department (HOSPITAL_COMMUNITY)
Admission: EM | Admit: 2016-07-25 | Discharge: 2016-07-25 | Disposition: A | Discharge status: Home / Self Care | Payer: Medicare Other | Attending: Emergency Medicine | Admitting: Emergency Medicine

## 2016-07-25 DIAGNOSIS — Z79899 Other long term (current) drug therapy: Secondary | ICD-10-CM | POA: Insufficient documentation

## 2016-07-25 DIAGNOSIS — M25552 Pain in left hip: Secondary | ICD-10-CM | POA: Diagnosis not present

## 2016-07-25 DIAGNOSIS — Z87891 Personal history of nicotine dependence: Secondary | ICD-10-CM | POA: Diagnosis not present

## 2016-07-25 DIAGNOSIS — R52 Pain, unspecified: Secondary | ICD-10-CM

## 2016-07-25 DIAGNOSIS — M5442 Lumbago with sciatica, left side: Secondary | ICD-10-CM | POA: Insufficient documentation

## 2016-07-25 DIAGNOSIS — J449 Chronic obstructive pulmonary disease, unspecified: Secondary | ICD-10-CM | POA: Diagnosis not present

## 2016-07-25 DIAGNOSIS — M545 Low back pain: Secondary | ICD-10-CM | POA: Diagnosis not present

## 2016-07-25 DIAGNOSIS — I10 Essential (primary) hypertension: Secondary | ICD-10-CM | POA: Diagnosis not present

## 2016-07-25 MED ORDER — MELOXICAM 15 MG PO TABS: 15.0000 mg | ORAL_TABLET | Freq: Every day | ORAL | 0 refills | Status: DC | PRN

## 2016-07-25 MED ORDER — GABAPENTIN 300 MG PO CAPS: 300.0000 mg | ORAL_CAPSULE | Freq: Every day | ORAL | 3 refills | Status: DC

## 2016-07-25 NOTE — ED Notes (Signed)
Bed: [facility] Expected date:  Expected time:  Means of arrival:  Comments: 

## 2016-07-25 NOTE — ED Triage Notes (Signed)
Lower back pain and left leg and hip pain for about 3 weeks walking with cane, no bowel or bladder problems voiced.

## 2016-07-25 NOTE — ED Provider Notes (Signed)
[facility] DEPT Provider Note   CSN: [id number] Arrival date & time: 07/25/16  2725     History   Chief Complaint Chief Complaint  Patient presents with  . Back Pain    HPI Kristina Ayala is a 71 y.o. female.   Back Pain   Pertinent negatives include no chest pain, no fever, no numbness, no abdominal pain, no dysuria and no weakness.   Patient presents with left-sided low back pain down to her hip and knee. Began 2-3 weeks ago. Has been walking with a cane. No weakness. No numbness. No fall or injury. States she cannot see a sports medicine doctor until 2 more weeks. No fevers. No loss of bladder bowel control. No abdominal pain. Pain goes from the back. She has had some back problems in the past. No rash. She has been taking gabapentin for it.   Past Medical History:  Diagnosis Date  . Arthritis   . Bursitis    left elbow  . Clostridium difficile infection   . Frequent falls   . HLD (hyperlipidemia)   . Hypertension   . Infectious colitis     Patient Active Problem List   Diagnosis Date Noted  . Hyperglycemia 03/03/2016  . Bursitis of right shoulder 09/04/2015  . Lateral meniscus derangement 06/03/2015  . Tear of LCL (lateral collateral ligament) of knee 06/03/2015  . Lumbar radiculopathy 09/19/2014  . Low back pain 01/04/2014  . Bilateral knee pain 01/04/2014  . Diverticulosis of colon without hemorrhage 07/26/2013  . Trigger thumb of left hand 03/06/2013  . Essential hypertension, benign 02/15/2013  . Hyperlipidemia 09/28/2012  . COPD (chronic obstructive pulmonary disease) ([place]) 05/22/2012  . Frequent falls 05/22/2012  . Osteoporosis 05/22/2012  . Hypokalemia 08/04/2011    Past Surgical History:  Procedure Laterality Date  . COLONOSCOPY    . ORIF PROXIMAL TIBIAL PLATEAU FRACTURE Left 02/19/2012  . POLYPECTOMY    . TONSILLECTOMY      OB History    No data available       Home Medications    Prior to Admission medications   Medication  Sig Start Date End Date Taking? Authorizing Provider  acetaminophen (TYLENOL) 500 MG tablet Take 1,000 mg by mouth every 6 (six) hours as needed (For burn pain.).    [provider]  ALPRAZolam Duanne Moron) 0.5 MG tablet TAKE 1 TABLET BY MOUTH EVERY NIGHT AT BEDTIME AS NEEDED FOR ANXIETY 11/04/15   Nafziger, Tommi Rumps, NP  amLODipine (NORVASC) 5 MG tablet TAKE 1 TABLET(5 MG) BY MOUTH DAILY 05/17/16   [name], MD  atorvastatin (LIPITOR) 40 MG tablet TAKE 1 TABLET(40 MG) BY MOUTH EVERY OTHER DAY 03/03/16   [name], MD  atorvastatin (LIPITOR) 40 MG tablet TAKE 1 TABLET(40 MG) BY MOUTH EVERY MORNING 06/01/16   Burns, Claudina Lick, MD  calcium-vitamin D (OSCAL WITH D) 500-200 MG-UNIT per tablet Take 1 tablet by mouth daily with breakfast.     [provider]  cholecalciferol (VITAMIN D) 1000 UNITS tablet Take 1,000 Units by mouth daily.    [provider]  Cyanocobalamin (VITAMIN B-12) 2500 MCG SUBL Place 1 tablet under the tongue daily.    [provider]  ferrous sulfate 325 (65 FE) MG tablet Take 1 tablet (325 mg total) by mouth daily with breakfast. 03/29/14   Lyndal Pulley, DO  gabapentin (NEURONTIN) 300 MG capsule Take 1 capsule (300 mg total) by mouth at bedtime. 07/25/16   Davonna Belling, MD  hydrochlorothiazide (HYDRODIURIL)  25 MG tablet TAKE 1 TABLET(25 MG) BY MOUTH EVERY MORNING 05/05/16   Burns, Claudina Lick, MD  ibuprofen (ADVIL,MOTRIN) 200 MG tablet Take 200-400 mg by mouth every 6 (six) hours as needed for moderate pain.    [provider]  Ibuprofen-Famotidine 800-26.6 MG TABS TAKE 1 TABLET DAILY AS NEEDED. 12/09/14   Lyndal Pulley, DO  lisinopril (PRINIVIL,ZESTRIL) 40 MG tablet TAKE 1 TABLET(40 MG) BY MOUTH EVERY MORNING 06/28/16   Burns, Claudina Lick, MD  Magnesium 100 MG CAPS Take 100 mg by mouth daily.    [provider]  meloxicam (MOBIC) 15 MG tablet Take 1 tablet (15 mg total) by mouth daily as needed for pain. 07/25/16   Davonna Belling, MD    potassium chloride (KLOR-CON) 8 MEQ tablet Take 2 tablets (16 mEq total) by mouth daily. 01/05/16   Lyndal Pulley, DO  tiZANidine (ZANAFLEX) 4 MG tablet Take 1 tablet (4 mg total) by mouth Nightly. 12/17/14   Lyndal Pulley, DO  TURMERIC PO Take 1 tablet by mouth daily.    [provider]    Family History Family History  Problem Relation Age of Onset  . Diverticulosis Mother   . Ovarian cancer Mother   . Pancreatic cancer Father   . Lung cancer Father   . Colon polyps Sister   . Colon cancer Neg Hx     Social History Social History  Substance Use Topics  . Smoking status: Former Smoker    Packs/day: 0.25    Years: 50.00    Types: Cigarettes    Quit date: 08/28/2014  . Smokeless tobacco: Never Used     Comment: 1 pack every 3 days  . Alcohol use No     Allergies   Doxycycline; Minocycline; Wellbutrin [bupropion]; and Penicillins   Review of Systems Review of Systems  Constitutional: Negative for appetite change and fever.  Cardiovascular: Negative for chest pain.  Gastrointestinal: Negative for abdominal pain.  Genitourinary: Negative for dysuria and vaginal discharge.  Musculoskeletal: Positive for back pain.       Left hip and left knee pain  Skin: Negative for rash.  Neurological: Negative for weakness and numbness.  Psychiatric/Behavioral: Negative for confusion.     Physical Exam Updated Vital Signs BP (!) 138/91 (BP Location: Left Arm)   Pulse 85   Temp 98.3 F (36.8 C) (Oral)   Resp 20   Ht 5\' 5"  (1.651 m)   Wt 76.7 kg (169 lb)   SpO2 100%   BMI 28.12 kg/m   Physical Exam  Constitutional: She appears well-developed.  HENT:  Head: Normocephalic.  Neck: Neck supple.  Cardiovascular: Normal rate.   Pulmonary/Chest: Effort normal. No respiratory distress.  Abdominal: She exhibits no mass. There is no tenderness.  Musculoskeletal:  Mild tenderness over lower lumbar spine and less posterior pelvis. Good range of motion left hip.  Good range of motion left knee. Neurovascular intact to bilateral feet. No rash on left hip area.  Skin: Skin is warm. Capillary refill takes less than 2 seconds.     ED Treatments / Results  Labs (all labs ordered are listed, but only abnormal results are displayed) Labs Reviewed - No data to display  EKG  EKG Interpretation None       Radiology Dg Lumbar Spine Complete  Result Date: 07/25/2016 CLINICAL DATA:  Acute onset low back pain today. Osteoporosis. No known injury. EXAM: LUMBAR SPINE - COMPLETE 4+ VIEW COMPARISON:  06/03/2015 FINDINGS: There is no evidence  of lumbar spine fracture. Alignment is normal. Mild degenerative disc disease again seen at levels of L1- 2, L3-4, and L4-5. Bilateral lower lumbar facet DJD again seen. Mild to moderate levoscoliosis without significant change. Generalized osteopenia again noted. Aortic atherosclerosis. IMPRESSION: No acute findings.  Degenerative spondylosis, as described above. Electronically Signed   By: Earle Gell M.D.   On: 07/25/2016 08:21   Dg Hip Unilat W Or Wo Pelvis 2-3 Views Left  Result Date: 07/25/2016 CLINICAL DATA:  Acute onset left hip pain today.  No known injury. EXAM: DG HIP (WITH OR WITHOUT PELVIS) 2-3V LEFT COMPARISON:  None. FINDINGS: There is no evidence of hip fracture or dislocation. There is no evidence of arthropathy or other focal bone abnormality. IMPRESSION: Negative. Electronically Signed   By: Earle Gell M.D.   On: 07/25/2016 08:09    Procedures Procedures (including critical care time)  Medications Ordered in ED Medications - No data to display   Initial Impression / Assessment and Plan / ED Course  I have reviewed the triage vital signs and the nursing notes.  Pertinent labs & imaging results that were available during my care of the patient were reviewed by me and considered in my medical decision making (see chart for details).     Patient with low back pain/hip pain radiating down the leg.  Imaging reassuring. Reviewed records no previous record of AAA. No red flags on exam. Will discharge home with Ferol Luz quit she's been on in the past and gabapentin which she has been on and recently ran out of. Will follow-up with her sports medicine doctor.  Final Clinical Impressions(s) / ED Diagnoses   Final diagnoses:  Acute left-sided low back pain with left-sided sciatica    New Prescriptions Current Discharge Medication List       Davonna Belling, MD 07/25/16 [phone]

## 2016-07-30 ENCOUNTER — Telehealth: Payer: Self-pay | Admitting: *Deleted

## 2016-07-30 ENCOUNTER — Other Ambulatory Visit: Payer: Self-pay

## 2016-07-30 MED ORDER — ALPRAZOLAM 0.5 MG PO TABS: ORAL_TABLET | ORAL | 0 refills | Status: DC

## 2016-07-30 NOTE — Telephone Encounter (Signed)
Pt left msg on triage stating she is needing to get a refill on her alprazolam foe her nerves.Marland KitchenJohny Chess

## 2016-07-30 NOTE — Patient Outreach (Signed)
Outreach patient on today 07/30/16 but no answer.  Left a voice mail message asking for patient to call back.

## 2016-07-30 NOTE — Telephone Encounter (Signed)
[place] controlled substance database checked.  Ok to fill medication. rx printed 

## 2016-08-02 ENCOUNTER — Telehealth: Payer: Self-pay | Admitting: Family Medicine

## 2016-08-02 NOTE — Telephone Encounter (Signed)
Pt would like a call back, she would like to be worked in for a shot

## 2016-08-02 NOTE — Telephone Encounter (Signed)
Notified pt [facility] 

## 2016-08-03 ENCOUNTER — Other Ambulatory Visit: Payer: Self-pay | Admitting: *Deleted

## 2016-08-03 NOTE — Patient Outreach (Signed)
[facility]) Care Management  08/03/2016  Kristina Ayala 02/09/1945 [id number]   Telephone Screen  Referral Date: 08/03/16 Referral Source: EMMI Prevent, Nurse Call Center Referral Reason: Needing help with meds. Pain meds prescribed when she went to ED make her Lethargic. Insurance: [facility] Medicare  Outreach attempt # 1 spoke with patient regarding questionnaire with Web designer. Spoke with patient about questions/concerns made to Nurse call center. HIPAA verified with patient.  Social:  Patient lives alone. She is independent with ADLs. She transports herself to medical appointments. She use a cane to assist with ambulation.  Conditions: Past Medical Hx: Arthritis, Bursitis, Colostrum Difficile, Falls, Colitis Patient voiced having a pain level of 8 in her hip. Meloxicam dosage was changed during her visit to the emergency room on 07/25/16. Patient described having lethargy, related to Meloxicam. She stated, she is unable to tolerate this medication. Patient stated, she notified her MD about the side effects of the medication. She arranged an appointment with her PCP to receive an injection for her hip pain. Patient verbalized having these injections in the past, which decrease her pain for about a month. Patient is knowledgeable about notifying MD regarding any questions or concerns.   Medications: Patient reported taking 4 prescribed medication and supplements per day. She is able to afford his medications. She takes her meds as prescribed or "when she can remember them".  Patient had no questions regarding her medications.   Appointments: Patient has an upcoming appointment on 08/05/16 with PCP for a "shot in her hip for pain".  Advanced Directives: Pt reported not having an Advanced Directive. She stated, she has several papers in her possession related to Advanced Directives. She plans to complete the paperwork in the future. Her current plan is to  donate her body to science and her family is aware of her wishes.   Consent: [facility] services reviewed and discussed with patient. Verbal consent for [facility] services not obtained. Patient will contact [facility] in the future, if needed. She believes, the "shot will help decrease her pain".   Plan:  RN CM will notify [facility] CM administrative assistant regarding case closure. RN CM advised patient to contact RNCM for any needs or concerns.    [place] Bells, RN, BSN, MHA/MSL, [place] Telephonic Care Manager Coordinator [facility] Direct Phone: [phone] Toll Free: [phone] Fax: [phone]

## 2016-08-03 NOTE — Telephone Encounter (Signed)
Pt scheduled Thursday  @ 830am.

## 2016-08-05 ENCOUNTER — Encounter: Payer: Self-pay | Admitting: Family Medicine

## 2016-08-05 ENCOUNTER — Ambulatory Visit (INDEPENDENT_AMBULATORY_CARE_PROVIDER_SITE_OTHER): Payer: Medicare Other | Admitting: Family Medicine

## 2016-08-05 VITALS — BP 132/86 | HR 90 | Ht 65.0 in | Wt 167.0 lb

## 2016-08-05 DIAGNOSIS — M5416 Radiculopathy, lumbar region: Secondary | ICD-10-CM | POA: Diagnosis not present

## 2016-08-05 MED ORDER — KETOROLAC TROMETHAMINE 60 MG/2ML IM SOLN
60.0000 mg | Freq: Once | INTRAMUSCULAR | Status: AC
  Administered 2016-08-05: 60 mg via INTRAMUSCULAR

## 2016-08-05 MED ORDER — METHYLPREDNISOLONE ACETATE 80 MG/ML IJ SUSP
80.0000 mg | Freq: Once | INTRAMUSCULAR | Status: AC
  Administered 2016-08-05: 80 mg via INTRAMUSCULAR

## 2016-08-05 MED ORDER — TIZANIDINE HCL 4 MG PO TABS: 4.0000 mg | ORAL_TABLET | Freq: Every evening | ORAL | 2 refills | Status: DC

## 2016-08-05 MED ORDER — PREDNISONE 50 MG PO TABS: 50.0000 mg | ORAL_TABLET | Freq: Every day | ORAL | 0 refills | Status: DC

## 2016-08-05 MED ORDER — VITAMIN D (ERGOCALCIFEROL) 1.25 MG (50000 UNIT) PO CAPS: 50000.0000 [IU] | ORAL_CAPSULE | ORAL | 0 refills | Status: DC

## 2016-08-05 NOTE — Progress Notes (Signed)
[name] [place],  [address] Phone: [phone] Subjective:    I'm seeing this patient by the request  of:    CC: Low back pain  [id number]  Kristina Ayala is a 71 y.o. female coming in with complaint of low back pain. States his been severe. Having pain that seems to be radiating down the left leg. Severe enough that patient went to the emergency room. Has been taking the gabapentin nightly that she has been given. States that the meloxicam has not been helping. Patient states that the pain seems to be unrelenting. Patient rates the severity pain is 8 out of 10. Waking her up at night. States that she feels unstable and is walking with the aid of a cane   patient did have lumbar x-rays taken 07/25/2016. These were independently visualized by me. 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 Hip x-rays were also independently visualized showing no bony normality.  Past Medical History:  Diagnosis Date  . Arthritis   . Bursitis    left elbow  . Clostridium difficile infection   . Frequent falls   . HLD (hyperlipidemia)   . Hypertension   . Infectious colitis    Past Surgical History:  Procedure Laterality Date  . COLONOSCOPY    . ORIF PROXIMAL TIBIAL PLATEAU FRACTURE Left 02/19/2012  . POLYPECTOMY    . TONSILLECTOMY     Social History   Social History  . Marital status: Divorced    Spouse name: N/A  . Number of children: 2  . Years of education: N/A   Occupational History  . RETIRED    Social History Main Topics  . Smoking status: Former Smoker    Packs/day: 0.25    Years: 50.00    Types: Cigarettes    Quit date: 08/28/2014  . Smokeless tobacco: Never Used     Comment: 1 pack every 3 days  . Alcohol use No  . Drug use: No  . Sexual activity: Not Asked   Other Topics Concern  . None   Social History Narrative   Grandson  (77 y/o) lives with patient.     Son lives in [place]   Daughter lives in [place]   She is originally from [place], [place]   Divorced            Allergies  Allergen Reactions  . Doxycycline Diarrhea  . Minocycline Diarrhea  . Wellbutrin [Bupropion] Swelling    Per pt her tongue was swollen and sore/symptoms stopped once the medication was discontinued.   [name] Penicillins Rash    Has patient had a PCN reaction causing immediate rash, facial/tongue/throat swelling, SOB or lightheadedness with hypotension: yes Has patient had a PCN reaction causing severe rash involving mucus membranes or skin necrosis: no Has patient had a PCN reaction that required hospitalization: unknown Has patient had a PCN reaction occurring within the last 10 years: no If all of the above answers are "NO", then may proceed with Cephalosporin use.    Family History  Problem Relation Age of Onset  . Diverticulosis Mother   . Ovarian cancer Mother   . Pancreatic cancer Father   . Lung cancer Father   . Colon polyps Sister   . Colon cancer Neg Hx     Past medical history, social, surgical and family history all reviewed in electronic medical record.  No pertanent information unless  stated regarding to the chief complaint.   Review of Systems:Review of systems updated and as accurate as of 08/05/16  No headache, visual changes, nausea, vomiting, diarrhea, constipation, dizziness, abdominal pain, skin rash, fevers, chills, night sweats, weight loss, swollen lymph nodes,joint swelling,  chest pain, shortness of breath, mood changes. Positive muscle aches, body aches  Objective  Blood pressure 132/86, pulse 90, height 5\' 5"  (1.651 m), weight 167 lb (75.8 kg). Systems examined below as of 08/05/16   General: No apparent distress alert and oriented x3 mood and affect normal, dressed appropriately.  HEENT: Pupils equal, extraocular movements intact  Respiratory: Patient's speak in full sentences and does not appear  short of breath  Cardiovascular: No lower extremity edema, non tender, no erythema  Skin: Warm dry intact with no signs of infection or rash on extremities or on axial skeleton.  Abdomen: Soft nontender  Neuro: Cranial nerves II through XII are intact, neurovascularly intact in all extremities with 2+ DTRs and 2+ pulses.  Lymph: No lymphadenopathy of posterior or anterior cervical chain or axillae bilaterally.  Gait Cautious in a flexed position MSK:  Non tender with full range of motion and good stability and symmetric strength and tone of shoulders, elbows, wrist, hip, knee and ankles bilaterally. Arthritic changes of multiple joints    Back Exam:  Inspection: Unremarkable  Motion: Flexion 25 deg with worsening symptoms on the left leg, Extension 25 deg, Side Bending to 25 deg bilaterally,  Rotation to 25 deg bilaterally  SLR laying: Positive left side XSLR laying: Negative  Palpable tenderness: Tender to palpation in the paraspinal musculature.[name] FABER: negative. Sensory change: Gross sensation intact to all lumbar and sacral dermatomes.  Reflexes: 2+ at both patellar tendons, 2+ at achilles tendons, Babinski's downgoing.  Strength at foot  4 out of 5 strength on the left sign.  Impression and Recommendations:     This case required medical decision making of moderate complexity.      Note: This dictation was prepared with Dragon dictation along with smaller phrase technology. Any transcriptional errors that result from this process are unintentional.

## 2016-08-05 NOTE — Assessment & Plan Note (Signed)
Patient is having signs and symptoms and more of a lumbar radiculopathy. At this time patient was given 2 injections. We discussed icing regimen, home exercises, prednisone given. Encourage her to continue the gabapentin at night. Worsening symptoms we'll consider advanced imaging. Patient knows if any weakness or constant numbness occurs we will need to further imaging patient should seek medical attention immediately. Otherwise patient will follow-up again in 2-3 weeks.

## 2016-08-05 NOTE — Patient Instructions (Signed)
Good to see you  Ice 20 minutes 2 times daily. Usually after activity and before bed. 2 injections today  Starting tomorrow prednisone daily for 5 days.  DO NOT take meloxicam with this Gabapentin at night Zanaflex at night as well.  Once weekly vitamin D for 12 weeks and stop the daily  See me again in 2-3 weeks or call me Monday if not better and we will get MRI.

## 2016-08-09 ENCOUNTER — Other Ambulatory Visit: Payer: Self-pay

## 2016-08-09 ENCOUNTER — Telehealth: Payer: Self-pay | Admitting: Family Medicine

## 2016-08-09 ENCOUNTER — Other Ambulatory Visit: Payer: Self-pay | Admitting: Family Medicine

## 2016-08-09 DIAGNOSIS — M5416 Radiculopathy, lumbar region: Secondary | ICD-10-CM

## 2016-08-09 NOTE — Telephone Encounter (Signed)
Pt called giving an update as told to by Tamala Julian, she is still in a lot of pain but no weakness or numbness and she would like to do the MRI and would like a call back in regard to this

## 2016-08-09 NOTE — Telephone Encounter (Signed)
Called patient to let her know that we placed referral for MRI and [facility] will be calling her.

## 2016-08-18 ENCOUNTER — Ambulatory Visit (INDEPENDENT_AMBULATORY_CARE_PROVIDER_SITE_OTHER): Payer: Medicare Other | Admitting: Family Medicine

## 2016-08-18 ENCOUNTER — Encounter: Payer: Self-pay | Admitting: Family Medicine

## 2016-08-18 VITALS — BP 122/80 | HR 90 | Temp 98.2°F | Ht 65.0 in | Wt 170.0 lb

## 2016-08-18 DIAGNOSIS — J069 Acute upper respiratory infection, unspecified: Secondary | ICD-10-CM | POA: Diagnosis not present

## 2016-08-18 NOTE — Patient Instructions (Signed)
Thank you for coming in,   He can try over-the-counter medications and other things such as lozenges or Chloraseptic spray. Honey works well for this too. Please follow-up with [place] if your symptoms worsen or fail to improve.   Please feel free to call with any questions or concerns at any time. --Dr. Raeford Razor

## 2016-08-18 NOTE — Progress Notes (Signed)
Kristina Ayala - 71 y.o. female MRN [id number]  Date of birth: October 19, 1945  SUBJECTIVE:  Including CC & ROS.  Chief Complaint  Patient presents with  . Cold    Started monday as a head cold and turned into a dry cough and losing voice, hot teas and cough syrup is not working    Kristina Ayala is a 71 year old female who is presenting with a cold. She reports she is around her granddaughter and started having the symptoms afterwards. They started on Sunday. She did have some nasal congestion and coughing. No watery eyes. Has not tried any over-the-counter medications yet. Feels like her symptoms are staying the same. She denies any travel or other sick contacts. She reports some hoarseness when she speaks. She feels like her symptoms are the same during the day as they're at night. Nothing seems to make her symptoms better or worse.    Review of Systems No fevers, night sweats, chest pain, shortness of breath, otherwise negative or see history of present illness   HISTORY: Past Medical, Surgical, Social, and Family History Reviewed & Updated per EMR.   Pertinent Historical Findings include:  Past Medical History:  Diagnosis Date  . Arthritis   . Bursitis    left elbow  . Clostridium difficile infection   . Frequent falls   . HLD (hyperlipidemia)   . Hypertension   . Infectious colitis     Allergies  Allergen Reactions  . Doxycycline Diarrhea  . Minocycline Diarrhea  . Wellbutrin [Bupropion] Swelling    Per pt her tongue was swollen and sore/symptoms stopped once the medication was discontinued.   [name] Penicillins Rash    Has patient had a PCN reaction causing immediate rash, facial/tongue/throat swelling, SOB or lightheadedness with hypotension: yes Has patient had a PCN reaction causing severe rash involving mucus membranes or skin necrosis: no Has patient had a PCN reaction that required hospitalization: unknown Has patient had a PCN reaction occurring within the last 10  years: no If all of the above answers are "NO", then may proceed with Cephalosporin use.     Family History  Problem Relation Age of Onset  . Diverticulosis Mother   . Ovarian cancer Mother   . Pancreatic cancer Father   . Lung cancer Father   . Colon polyps Sister   . Colon cancer Neg Hx      Social History   Social History  . Marital status: Divorced    Spouse name: N/A  . Number of children: 2  . Years of education: N/A   Occupational History  . RETIRED    Social History Main Topics  . Smoking status: Former Smoker    Packs/day: 0.25    Years: 50.00    Types: Cigarettes    Quit date: 08/28/2014  . Smokeless tobacco: Never Used     Comment: 1 pack every 3 days  . Alcohol use No  . Drug use: No  . Sexual activity: Not on file   Other Topics Concern  . Not on file   Social History Narrative   Grandson (37 y/o) lives with patient.     Son lives in [place]   Daughter lives in [place]   She is originally from [place], [place]   Divorced              PHYSICAL EXAM:  VS: BP 122/80 (BP Location: Left Arm, Patient Position: Sitting, Cuff Size: Normal)   Pulse 90   Temp 98.2 F (36.8  C) (Oral)   Ht 5\' 5"  (1.651 m)   Wt 170 lb (77.1 kg)   SpO2 100%   BMI 28.29 kg/m  PHYSICAL EXAM: Gen: NAD, alert, cooperative with exam, well-appearing HEENT: NCAT, EOMI, clear conjunctiva, oropharynx clear, supple neck CV: RRR, good S1/S2, no murmur, no edema, capillary refill brisk  Resp: CTABL, no wheezes, non-labored Abd: SNTND, BS present, no guarding or organomegaly Skin: no rashes, normal turgor  Neuro: no gross deficits.  Psych: , alert and oriented   ASSESSMENT & PLAN:   Upper respiratory tract infection Symptoms are most consistent with an upper respiratory tract infection. This is acute in nature. - Advised conservative care - Encouraged follow-up if her symptoms worsen .

## 2016-08-18 NOTE — Assessment & Plan Note (Addendum)
Symptoms are most consistent with an upper respiratory tract infection. This is acute in nature. - Advised conservative care - Encouraged follow-up if her symptoms worsen .

## 2016-08-20 ENCOUNTER — Ambulatory Visit
Admission: RE | Admit: 2016-08-20 | Discharge: 2016-08-20 | Disposition: A | Discharge status: Home / Self Care | Payer: Medicare Other | Source: Ambulatory Visit | Attending: Family Medicine | Admitting: Family Medicine

## 2016-08-20 DIAGNOSIS — M5126 Other intervertebral disc displacement, lumbar region: Secondary | ICD-10-CM | POA: Diagnosis not present

## 2016-08-20 DIAGNOSIS — M5416 Radiculopathy, lumbar region: Secondary | ICD-10-CM

## 2016-08-22 ENCOUNTER — Other Ambulatory Visit: Payer: Medicare Other

## 2016-08-23 ENCOUNTER — Telehealth: Payer: Self-pay | Admitting: Family Medicine

## 2016-08-23 NOTE — Progress Notes (Signed)
Spoke with patient regarding MRI results. She said that her back has been bothering her and that she had a hard time getting out of bed due to the pain and her knee swelling up. Told her to elevate, ice, and use compression for her knee and to follow-up in pain persists. Patient is ok with receiving an epidural so order was placed.

## 2016-08-23 NOTE — Telephone Encounter (Signed)
Pt called checking on the results from her MRI.

## 2016-08-24 ENCOUNTER — Ambulatory Visit: Payer: Medicare Other | Admitting: Family Medicine

## 2016-08-24 ENCOUNTER — Ambulatory Visit (INDEPENDENT_AMBULATORY_CARE_PROVIDER_SITE_OTHER): Payer: Medicare Other | Admitting: Internal Medicine

## 2016-08-24 ENCOUNTER — Encounter: Payer: Self-pay | Admitting: Internal Medicine

## 2016-08-24 VITALS — BP 130/84 | HR 90 | Temp 98.6°F | Resp 16 | Wt 166.0 lb

## 2016-08-24 DIAGNOSIS — J069 Acute upper respiratory infection, unspecified: Secondary | ICD-10-CM

## 2016-08-24 MED ORDER — HYDROCODONE-HOMATROPINE 5-1.5 MG/5ML PO SYRP
5.0000 mL | ORAL_SOLUTION | Freq: Three times a day (TID) | ORAL | 0 refills | Status: DC | PRN
Start: 2016-08-24 — End: 2016-09-16

## 2016-08-24 NOTE — Progress Notes (Signed)
Subjective:    Patient ID: Kristina Ayala, female    DOB: 08-Jul-1945, 71 y.o.   MRN: [id number]  HPI She is here for an acute visit for cold symptoms.   Her symptoms started 8-9 days ago.  She was seen here last week.   She is experiencing sore throat, dry cough, headaches, nasal congestion, PND, and occasional dizziness.  She has tried taking loratadine and prescription cough syrup with some improvement in symptoms.    Medications and allergies reviewed with patient and updated if appropriate.  Patient Active Problem List   Diagnosis Date Noted  . Upper respiratory tract infection 08/18/2016  . Hyperglycemia 03/03/2016  . Bursitis of right shoulder 09/04/2015  . Lateral meniscus derangement 06/03/2015  . Tear of LCL (lateral collateral ligament) of knee 06/03/2015  . Lumbar radiculopathy 09/19/2014  . Low back pain 01/04/2014  . Bilateral knee pain 01/04/2014  . Diverticulosis of colon without hemorrhage 07/26/2013  . Trigger thumb of left hand 03/06/2013  . Essential hypertension, benign 02/15/2013  . Hyperlipidemia 09/28/2012  . COPD (chronic obstructive pulmonary disease) ([place]) 05/22/2012  . Frequent falls 05/22/2012  . Osteoporosis 05/22/2012  . Hypokalemia 08/04/2011    Current Outpatient Prescriptions on File Prior to Visit  Medication Sig Dispense Refill  . acetaminophen (TYLENOL) 500 MG tablet Take 1,000 mg by mouth every 6 (six) hours as needed (For burn pain.).    [name] ALPRAZolam (XANAX) 0.5 MG tablet TAKE 1 TABLET BY MOUTH EVERY NIGHT AT BEDTIME AS NEEDED FOR ANXIETY 30 tablet 0  . amLODipine (NORVASC) 5 MG tablet TAKE 1 TABLET(5 MG) BY MOUTH DAILY 90 tablet 2  . atorvastatin (LIPITOR) 40 MG tablet TAKE 1 TABLET(40 MG) BY MOUTH EVERY MORNING 90 tablet 1  . calcium-vitamin D (OSCAL WITH D) 500-200 MG-UNIT per tablet Take 1 tablet by mouth daily with breakfast.     . cholecalciferol (VITAMIN D) 1000 UNITS tablet Take 1,000 Units by mouth daily.    .  Cyanocobalamin (VITAMIN B-12) 2500 MCG SUBL Place 1 tablet under the tongue daily.    . ferrous sulfate 325 (65 FE) MG tablet Take 1 tablet (325 mg total) by mouth daily with breakfast. 30 tablet 3  . gabapentin (NEURONTIN) 300 MG capsule Take 1 capsule (300 mg total) by mouth at bedtime. 30 capsule 3  . hydrochlorothiazide (HYDRODIURIL) 25 MG tablet TAKE 1 TABLET(25 MG) BY MOUTH EVERY MORNING 90 tablet 2  . ibuprofen (ADVIL,MOTRIN) 200 MG tablet Take 200-400 mg by mouth every 6 (six) hours as needed for moderate pain.    . Ibuprofen-Famotidine 800-26.6 MG TABS TAKE 1 TABLET DAILY AS NEEDED. 270 tablet 3  . lisinopril (PRINIVIL,ZESTRIL) 40 MG tablet TAKE 1 TABLET(40 MG) BY MOUTH EVERY MORNING 90 tablet 2  . Magnesium 100 MG CAPS Take 100 mg by mouth daily.    . meloxicam (MOBIC) 15 MG tablet Take 1 tablet (15 mg total) by mouth daily as needed for pain. 10 tablet 0  . potassium chloride (KLOR-CON) 8 MEQ tablet Take 2 tablets (16 mEq total) by mouth daily. 60 tablet 3  . predniSONE (DELTASONE) 50 MG tablet TAKE 1 TABLET(50 MG) BY MOUTH DAILY 5 tablet 0  . tiZANidine (ZANAFLEX) 4 MG tablet Take 1 tablet (4 mg total) by mouth Nightly. 30 tablet 2  . TURMERIC PO Take 1 tablet by mouth daily.    . Vitamin D, Ergocalciferol, (DRISDOL) 50000 units CAPS capsule Take 1 capsule (50,000 Units total) by mouth every 7 (seven)  days. 12 capsule 0   No current facility-administered medications on file prior to visit.     Past Medical History:  Diagnosis Date  . Arthritis   . Bursitis    left elbow  . Clostridium difficile infection   . Frequent falls   . HLD (hyperlipidemia)   . Hypertension   . Infectious colitis     Past Surgical History:  Procedure Laterality Date  . COLONOSCOPY    . ORIF PROXIMAL TIBIAL PLATEAU FRACTURE Left 02/19/2012  . POLYPECTOMY    . TONSILLECTOMY      Social History   Social History  . Marital status: Divorced    Spouse name: N/A  . Number of children: 2  . Years  of education: N/A   Occupational History  . RETIRED    Social History Main Topics  . Smoking status: Former Smoker    Packs/day: 0.25    Years: 50.00    Types: Cigarettes    Quit date: 08/28/2014  . Smokeless tobacco: Never Used     Comment: 1 pack every 3 days  . Alcohol use No  . Drug use: No  . Sexual activity: Not Asked   Other Topics Concern  . None   Social History Narrative   Grandson (38 y/o) lives with patient.     Son lives in [place]   Daughter lives in [place]   She is originally from [place], [place]   Divorced             Family History  Problem Relation Age of Onset  . Diverticulosis Mother   . Ovarian cancer Mother   . Pancreatic cancer Father   . Lung cancer Father   . Colon polyps Sister   . Colon cancer Neg Hx     Review of Systems  Constitutional: Negative for chills and fever.  HENT: Positive for congestion, postnasal drip and sore throat. Negative for ear pain (clogged intermittent), sinus pain and sinus pressure.   Respiratory: Positive for cough (dry ). Negative for shortness of breath and wheezing.   Cardiovascular: Negative for chest pain.  Gastrointestinal: Positive for constipation. Negative for diarrhea and nausea.  Neurological: Positive for dizziness and headaches.       Objective:   Vitals:   08/24/16 0949  BP: 130/84  Pulse: 90  Resp: 16  Temp: 98.6 F (37 C)   Filed Weights   08/24/16 0949  Weight: 166 lb (75.3 kg)   Body mass index is 27.62 kg/m.  Wt Readings from Last 3 Encounters:  08/24/16 166 lb (75.3 kg)  08/18/16 170 lb (77.1 kg)  08/05/16 167 lb (75.8 kg)     Physical Exam GENERAL APPEARANCE: Appears stated age, well appearing, NAD EYES: conjunctiva clear, no icterus HEENT: bilateral tympanic membranes and ear canals normal, oropharynx with mild erythema, no sinus pain, no thyromegaly, trachea midline, no cervical or supraclavicular lymphadenopathy LUNGS: Clear to auscultation without wheeze or  crackles, unlabored breathing, good air entry bilaterally HEART: Normal S1,S2 without murmurs EXTREMITIES: Without clubbing, cyanosis, or edema       Assessment & Plan:   See Problem List for Assessment and Plan of chronic medical problems.

## 2016-08-24 NOTE — Patient Instructions (Addendum)
Start the prescription cough syrup at night.  Start an oral anti-histamine.   You can try the nasal spray as well.    Your prescription(s) have been submitted to your pharmacy or been printed and provided for you. Please take as directed and contact our office if you believe you are having problem(s) with the medication(s) or have any questions.  If your symptoms worsen or fail to improve, please contact our office for further instruction, or in case of emergency go directly to the emergency room at the closest medical facility.   General Recommendations:    Please drink plenty of fluids.  Get plenty of rest   Sleep in humidified air  Use saline nasal sprays  Netti pot  OTC Medications:  Decongestants - helps relieve congestion   Flonase (generic fluticasone) or Nasacort (generic triamcinolone) - please make sure to use the "cross-over" technique at a 45 degree angle towards the opposite eye as opposed to straight up the nasal passageway.   Sudafed (generic pseudoephedrine - Note this is the one that is available behind the pharmacy counter); Products with phenylephrine (-PE) may also be used but is often not as effective as pseudoephedrine.   If you have HIGH BLOOD PRESSURE - Coricidin HBP; AVOID any product that is -D as this contains pseudoephedrine which may increase your blood pressure.  Afrin (oxymetazoline) every 6-8 hours for up to 3 days.  Allergies - helps relieve runny nose, itchy eyes and sneezing   Claritin (generic loratidine), Allegra (fexofenidine), or Zyrtec (generic cyrterizine) for runny nose. These medications should not cause drowsiness.  Note - Benadryl (generic diphenhydramine) may be used however may cause drowsiness  Cough -   Delsym or Robitussin (generic dextromethorphan)  Expectorants - helps loosen mucus to ease removal   Mucinex (generic guaifenesin) as directed on the package.  Headaches / General Aches   Tylenol  (generic acetaminophen) - DO NOT EXCEED 3 grams (3,000 mg) in a 24 hour time period  Advil/Motrin (generic ibuprofen)  Sore Throat -   Salt water gargle   Chloraseptic (generic benzocaine) spray or lozenges / Sucrets (generic dyclonine)

## 2016-08-24 NOTE — Assessment & Plan Note (Signed)
Viral in nature Hydromet, allegra samples given, nasacort sample given Increase rest, fluids advil prn  Call if no improvement

## 2016-08-31 ENCOUNTER — Telehealth: Payer: Self-pay | Admitting: Family Medicine

## 2016-08-31 DIAGNOSIS — M5416 Radiculopathy, lumbar region: Secondary | ICD-10-CM

## 2016-08-31 NOTE — Telephone Encounter (Signed)
Pt called stating a nerve shot was supposed to be called into [facility] She would like a call back, she said it is imperative

## 2016-08-31 NOTE — Telephone Encounter (Signed)
Sent order to [facility]. Advised pt to try calling [facility] again to schedule appt.

## 2016-09-01 ENCOUNTER — Ambulatory Visit: Payer: Medicare Other | Admitting: Internal Medicine

## 2016-09-03 ENCOUNTER — Ambulatory Visit
Admission: RE | Admit: 2016-09-03 | Discharge: 2016-09-03 | Disposition: A | Discharge status: Home / Self Care | Payer: Medicare Other | Source: Ambulatory Visit | Attending: Family Medicine | Admitting: Family Medicine

## 2016-09-03 DIAGNOSIS — M545 Low back pain: Secondary | ICD-10-CM | POA: Diagnosis not present

## 2016-09-03 DIAGNOSIS — M5416 Radiculopathy, lumbar region: Secondary | ICD-10-CM

## 2016-09-03 MED ORDER — IOPAMIDOL (ISOVUE-M 200) INJECTION 41%
1.0000 mL | Freq: Once | INTRAMUSCULAR | Status: AC
  Administered 2016-09-03: 1 mL via EPIDURAL

## 2016-09-03 MED ORDER — METHYLPREDNISOLONE ACETATE 40 MG/ML INJ SUSP (RADIOLOG
120.0000 mg | Freq: Once | INTRAMUSCULAR | Status: AC
  Administered 2016-09-03: 120 mg via EPIDURAL

## 2016-09-03 NOTE — Discharge Instructions (Signed)

## 2016-09-06 NOTE — Telephone Encounter (Signed)
Pt states injection done Friday and she feels no better yet. Explained it could take a full 7 days to feel better. Also, numbness is the last symptom to go away for most people. Explained she should follow up with DR. Creig Hines in 2 weeks.

## 2016-09-16 ENCOUNTER — Encounter: Payer: Self-pay | Admitting: Internal Medicine

## 2016-09-16 ENCOUNTER — Ambulatory Visit (INDEPENDENT_AMBULATORY_CARE_PROVIDER_SITE_OTHER): Payer: Medicare Other | Admitting: Internal Medicine

## 2016-09-16 ENCOUNTER — Ambulatory Visit (INDEPENDENT_AMBULATORY_CARE_PROVIDER_SITE_OTHER): Payer: Medicare Other | Admitting: Family Medicine

## 2016-09-16 ENCOUNTER — Other Ambulatory Visit (INDEPENDENT_AMBULATORY_CARE_PROVIDER_SITE_OTHER): Payer: Medicare Other

## 2016-09-16 VITALS — BP 124/68 | HR 115 | Temp 99.5°F | Resp 16 | Wt 167.0 lb

## 2016-09-16 DIAGNOSIS — E7800 Pure hypercholesterolemia, unspecified: Secondary | ICD-10-CM

## 2016-09-16 DIAGNOSIS — R61 Generalized hyperhidrosis: Secondary | ICD-10-CM | POA: Diagnosis not present

## 2016-09-16 DIAGNOSIS — G479 Sleep disorder, unspecified: Secondary | ICD-10-CM

## 2016-09-16 DIAGNOSIS — I10 Essential (primary) hypertension: Secondary | ICD-10-CM

## 2016-09-16 DIAGNOSIS — R7303 Prediabetes: Secondary | ICD-10-CM

## 2016-09-16 DIAGNOSIS — M5416 Radiculopathy, lumbar region: Secondary | ICD-10-CM

## 2016-09-16 DIAGNOSIS — E119 Type 2 diabetes mellitus without complications: Secondary | ICD-10-CM | POA: Insufficient documentation

## 2016-09-16 LAB — COMPREHENSIVE METABOLIC PANEL WITH GFR
ALT: 28 U/L (ref 0–35)
AST: 17 U/L (ref 0–37)
Albumin: 4.4 g/dL (ref 3.5–5.2)
Alkaline Phosphatase: 47 U/L (ref 39–117)
BUN: 18 mg/dL (ref 6–23)
CO2: 34 meq/L — ABNORMAL HIGH (ref 19–32)
Calcium: 9.9 mg/dL (ref 8.4–10.5)
Chloride: 104 meq/L (ref 96–112)
Creatinine, Ser: 0.92 mg/dL (ref 0.40–1.20)
GFR: 77.06 mL/min
Glucose, Bld: 93 mg/dL (ref 70–99)
Potassium: 3.9 meq/L (ref 3.5–5.1)
Sodium: 144 meq/L (ref 135–145)
Total Bilirubin: 0.4 mg/dL (ref 0.2–1.2)
Total Protein: 7.4 g/dL (ref 6.0–8.3)

## 2016-09-16 LAB — CBC WITH DIFFERENTIAL/PLATELET
BASOS PCT: 1.1 % (ref 0.0–3.0)
Basophils Absolute: 0.1 10*3/uL (ref 0.0–0.1)
EOS ABS: 0.1 10*3/uL (ref 0.0–0.7)
Eosinophils Relative: 0.8 % (ref 0.0–5.0)
HCT: 42.8 % (ref 36.0–46.0)
HEMOGLOBIN: 13.7 g/dL (ref 12.0–15.0)
Lymphocytes Relative: 37.2 % (ref 12.0–46.0)
Lymphs Abs: 3.3 10*3/uL (ref 0.7–4.0)
MCHC: 32 g/dL (ref 30.0–36.0)
MCV: 84.3 fl (ref 78.0–100.0)
MONO ABS: 0.7 10*3/uL (ref 0.1–1.0)
Monocytes Relative: 8.1 % (ref 3.0–12.0)
NEUTROS PCT: 52.8 % (ref 43.0–77.0)
Neutro Abs: 4.7 10*3/uL (ref 1.4–7.7)
Platelets: 349 10*3/uL (ref 150.0–400.0)
RBC: 5.07 Mil/uL (ref 3.87–5.11)
RDW: 15.9 % — AB (ref 11.5–15.5)
WBC: 8.9 10*3/uL (ref 4.0–10.5)

## 2016-09-16 LAB — TSH: TSH: 0.53 u[IU]/mL (ref 0.35–4.50)

## 2016-09-16 LAB — HEMOGLOBIN A1C: Hgb A1c MFr Bld: 6 % (ref 4.6–6.5)

## 2016-09-16 MED ORDER — ALPRAZOLAM 0.5 MG PO TABS: ORAL_TABLET | ORAL | 0 refills | Status: DC

## 2016-09-16 NOTE — Assessment & Plan Note (Signed)
Continue statin. 

## 2016-09-16 NOTE — Patient Instructions (Addendum)
Test(s) ordered today. Your results will be released to [place] (or called to you) after review, usually within 72hours after test completion. If any changes need to be made, you will be notified at that same time.   Medications reviewed and updated.  No changes recommended at this time.  Your prescription(s) have been submitted to your pharmacy. Please take as directed and contact our office if you believe you are having problem(s) with the medication(s).  Please followup in 6 months    Prediabetes Prediabetes is the condition of having a blood sugar (blood glucose) level that is higher than it should be, but not high enough for you to be diagnosed with type 2 diabetes. Having prediabetes puts you at risk for developing type 2 diabetes (type 2 diabetes mellitus). Prediabetes may be called impaired glucose tolerance or impaired fasting glucose. Prediabetes usually does not cause symptoms. Your health care provider can diagnose this condition with blood tests. You may be tested for prediabetes if you are overweight and if you have at least one other risk factor for prediabetes. Risk factors for prediabetes include:  Having a family member with type 2 diabetes.  Being overweight or obese.  Being older than age 24.  Being of American-Indian, African-American, Hispanic/Latino, or Asian/Pacific Islander descent.  Having an inactive (sedentary) lifestyle.  Having a history of gestational diabetes or polycystic ovarian syndrome (PCOS).  Having low levels of good cholesterol (HDL-C) or high levels of blood fats (triglycerides).  Having high blood pressure.  What is blood glucose and how is blood glucose measured?  Blood glucose refers to the amount of glucose in your bloodstream. Glucose comes from eating foods that contain sugars and starches (carbohydrates) that the body breaks down into glucose. Your blood glucose level may be measured in mg/dL (milligrams per deciliter) or mmol/L  (millimoles per liter).Your blood glucose may be checked with one or more of the following blood tests:  A fasting blood glucose (FBG) test. You will not be allowed to eat (you will fast) for at least 8 hours before a blood sample is taken. ? A normal range for FBG is 70-100 mg/dl (3.9-5.6 mmol/L).  An A1c (hemoglobin A1c) blood test. This test provides information about blood glucose control over the previous 2?71months.  An oral glucose tolerance test (OGTT). This test measures your blood glucose twice: ? After fasting. This is your baseline level. ? Two hours after you drink a beverage that contains glucose.  You may be diagnosed with prediabetes:  If your FBG is 100?125 mg/dL (5.6-6.9 mmol/L).  If your A1c level is 5.7?6.4%.  If your OGGT result is 140?199 mg/dL (7.8-11 mmol/L).  These blood tests may be repeated to confirm your diagnosis. What happens if blood glucose is too high? The pancreas produces a hormone (insulin) that helps move glucose from the bloodstream into cells. When cells in the body do not respond properly to insulin that the body makes (insulin resistance), excess glucose builds up in the blood instead of going into cells. As a result, high blood glucose (hyperglycemia) can develop, which can cause many complications. This is a symptom of prediabetes. What can happen if blood glucose stays higher than normal for a long time? Having high blood glucose for a long time is dangerous. Too much glucose in your blood can damage your nerves and blood vessels. Long-term damage can lead to complications from diabetes, which may include:  Heart disease.  Stroke.  Blindness.  Kidney disease.  Depression.  Poor circulation  in the feet and legs, which could lead to surgical removal (amputation) in severe cases.  How can prediabetes be prevented from turning into type 2 diabetes?  To help prevent type 2 diabetes, take the following actions:  Be physically  active. ? Do moderate-intensity physical activity for at least 30 minutes on at least 5 days of the week, or as much as told by your health care provider. This could be brisk walking, biking, or water aerobics. ? Ask your health care provider what activities are safe for you. A mix of physical activities may be best, such as walking, swimming, cycling, and strength training.  Lose weight as told by your health care provider. ? Losing 5-7% of your body weight can reverse insulin resistance. ? Your health care provider can determine how much weight loss is best for you and can help you lose weight safely.  Follow a healthy meal plan. This includes eating lean proteins, complex carbohydrates, fresh fruits and vegetables, low-fat dairy products, and healthy fats. ? Follow instructions from your health care provider about eating or drinking restrictions. ? Make an appointment to see a diet and nutrition specialist (registered dietitian) to help you create a healthy eating plan that is right for you.  Do not smoke or use any tobacco products, such as cigarettes, chewing tobacco, and e-cigarettes. If you need help quitting, ask your health care provider.  Take over-the-counter and prescription medicines as told by your health care provider. You may be prescribed medicines that help lower the risk of type 2 diabetes.  This information is not intended to replace advice given to you by your health care provider. Make sure you discuss any questions you have with your health care provider. Document Released: 05/05/2015 Document Revised: 06/19/2015 Document Reviewed: 03/04/2015 Elsevier Interactive Patient Education  [employer].

## 2016-09-16 NOTE — Patient Instructions (Signed)
Good to see you  Kristina Ayala is your friend.  I want you to increase your activity  I want you to use the medicine when you need it.  Keep the luggage under $30 pounds  Enjoy the trip  See me again in 4 weeks, sometime after you start your new job

## 2016-09-16 NOTE — Assessment & Plan Note (Signed)
BP well controlled Current regimen effective and well tolerated Continue current medications at current doses  

## 2016-09-16 NOTE — Progress Notes (Signed)
[name] [address] [place], [place] [address] Phone: [phone] Subjective:     CC: Low back pain follow-up  [id number]  Kristina Ayala is a 71 y.o. female coming in with complaint of low back pain. Patient has had this for quite some time. Was having worsening symptoms. More radiculopathy going down the leg and even with some weakness. Patient was sent for an MRI of the lumbar spine which was done on 08/20/2016. This was independently visualized by me showing patient did have an L5 nerve root impingement. Patient elected to have a nerve root injection. An states that there was a very painful experience and does not want to have another one. Patient though states that she is feeling approximately 80% better. Still having some discomfort. Patient is concerned that her traveling in the near future to be causing more difficulty. Patient is also supposed to be starting a new job on September 10 and wants to make sure that she is okay to do so.      Past Medical History:  Diagnosis Date  . Arthritis   . Bursitis    left elbow  . Clostridium difficile infection   . Frequent falls   . HLD (hyperlipidemia)   . Hypertension   . Infectious colitis    Past Surgical History:  Procedure Laterality Date  . COLONOSCOPY    . ORIF PROXIMAL TIBIAL PLATEAU FRACTURE Left 02/19/2012  . POLYPECTOMY    . TONSILLECTOMY     Social History   Social History  . Marital status: Divorced    Spouse name: N/A  . Number of children: 2  . Years of education: N/A   Occupational History  . RETIRED    Social History Main Topics  . Smoking status: Former Smoker    Packs/day: 0.25    Years: 50.00    Types: Cigarettes    Quit date: 08/28/2014  . Smokeless tobacco: Never Used     Comment: 1 pack every 3 days  . Alcohol use No  . Drug use: No  . Sexual activity: Not on file   Other Topics Concern  . Not on file   Social History Narrative   Grandson (29  y/o) lives with patient.     Son lives in [place]   Daughter lives in [place]   She is originally from [place], [place]   Divorced            Allergies  Allergen Reactions  . Doxycycline Diarrhea  . Minocycline Diarrhea  . Wellbutrin [Bupropion] Swelling    Per pt her tongue was swollen and sore/symptoms stopped once the medication was discontinued.   [name] Penicillins Rash    Has patient had a PCN reaction causing immediate rash, facial/tongue/throat swelling, SOB or lightheadedness with hypotension: yes Has patient had a PCN reaction causing severe rash involving mucus membranes or skin necrosis: no Has patient had a PCN reaction that required hospitalization: unknown Has patient had a PCN reaction occurring within the last 10 years: no If all of the above answers are "NO", then may proceed with Cephalosporin use.    Family History  Problem Relation Age of Onset  . Diverticulosis Mother   . Ovarian cancer Mother   . Pancreatic cancer Father   . Lung cancer Father   . Colon polyps Sister   . Colon cancer Neg Hx      Past medical history, social, surgical and family history all reviewed in electronic medical record.  No pertanent information unless stated regarding to the chief complaint.   Review of Systems: No headache, visual changes, nausea, vomiting, diarrhea, constipation, dizziness, abdominal pain, skin rash, fevers, chills, night sweats, weight loss, swollen lymph nodes, body aches, joint swelling, , chest pain, shortness of breath, mood changes.  Positive muscle aches  Objective  Blood pressure 124/68, pulse (!) 115, weight 167 lb (75.8 kg), SpO2 96 %. Systems examined below as of 09/16/16   General: No apparent distress alert and oriented x3 mood and affect normal, dressed appropriately.  HEENT: Pupils equal, extraocular movements intact  Respiratory: Patient's speak in full sentences and does not appear short of breath  Cardiovascular: No lower extremity edema, non  tender, no erythema  Skin: Warm dry intact with no signs of infection or rash on extremities or on axial skeleton.  Abdomen: Soft nontender  Neuro: Cranial nerves II through XII are intact, neurovascularly intact in all extremities with 2+ DTRs and 2+ pulses.  Lymph: No lymphadenopathy of posterior or anterior cervical chain or axillae bilaterally.  Gait Mild antalgic gait.  MSK:  Non tender with full range of motion and good stability and symmetric strength and tone of shoulders, elbows, wrist, hip, knee and ankles bilaterally.  Back Exam:  Inspection: Loss of lordosis Motion: Flexion 45 deg, Extension 45 deg, Side Bending to 45 deg bilaterally,  Rotation to 45 deg bilaterally  SLR laying: Negative significant tightness of the hamstrings left greater than right XSLR laying: Negative  Palpable tenderness: Tender to palpation in the paraspinal musculature of the lumbar spine left greater than right.[name] FABER: negative. Sensory change: Gross sensation intact to all lumbar and sacral dermatomes.  Reflexes: 2+ at both patellar tendons, 2+ at achilles tendons, Babinski's downgoing.  Strength at foot  Plantar-flexion: 5/5 Dorsi-flexion: 5/5 Eversion: 5/5 Inversion: 5/5  Leg strength  Quad: 5/5 Hamstring: 5/5 Hip flexor: 5/5 Hip abductors: 5/5  Gait unremarkable.     Impression and Recommendations:     This case required medical decision making of moderate complexity.      Note: This dictation was prepared with Dragon dictation along with smaller phrase technology. Any transcriptional errors that result from this process are unintentional.

## 2016-09-16 NOTE — Assessment & Plan Note (Signed)
If neck and only at night Will check tsh, cbc

## 2016-09-16 NOTE — Assessment & Plan Note (Signed)
takes xanax as needed only - works well and without side effects Will continue - refilled today

## 2016-09-16 NOTE — Assessment & Plan Note (Signed)
Check a1c Low sugar / carb diet Stressed regular exercise   

## 2016-09-16 NOTE — Assessment & Plan Note (Signed)
Spent  25 minutes with patient face-to-face and had greater than 50% of counseling including as described in assessment and plan. Patient one and a when she is not completely relieved of the pain. We discussed with patient that some of this is secondary to arthritis and not all of it is secondary to the inflammation. We discussed different treatment options at this time including formal physical therapy which patient declined. We encourage her to take the gabapentin on a more regular basis which she said she will consider. We also discussed continuing the once weekly vitamin D. We discussed icing regimen, which activities to do in which ones to avoid. Patient is to start increasing activity slowly. I do not feel that she has any significant restrictions at this time but would slowly increase activity. We discussed the possibility of also repeating the epidural if needed which patient was not too keen on. We discussed with her at this point I do not feel that pain medications would be beneficial and she would need to seen pain management clinic if needed. Patient will try the conservative therapy and see me again in 4-6 weeks.

## 2016-09-16 NOTE — Progress Notes (Signed)
Subjective:    Patient ID: Kristina Ayala, female    DOB: 05-04-1945, 71 y.o.   MRN: [id number]  HPI The patient is here for follow up.  Hypertension: She is taking her medication daily. She is compliant with a low sodium diet.  She denies chest pain, palpitations, edema, shortness of breath and regular headaches. She is exercising regularly.  She does not monitor her blood pressure at home.    Hyperlipidemia: She is taking her medication daily. She is compliant with a low fat/cholesterol diet. She is exercising regularly. She denies myalgias.   Prediabetes:  She is compliant with a low sugar/carbohydrate diet.  She is exercising regularly.  Anxiety, sleep difficulties:  She takes xanax  Medications and allergies reviewed with patient and updated if appropriate.  Patient Active Problem List   Diagnosis Date Noted  . Prediabetes 09/16/2016  . Sleep difficulties 09/16/2016  . Bursitis of right shoulder 09/04/2015  . Lateral meniscus derangement 06/03/2015  . Tear of LCL (lateral collateral ligament) of knee 06/03/2015  . Lumbar radiculopathy 09/19/2014  . Low back pain 01/04/2014  . Bilateral knee pain 01/04/2014  . Diverticulosis of colon without hemorrhage 07/26/2013  . Trigger thumb of left hand 03/06/2013  . Essential hypertension, benign 02/15/2013  . Hyperlipidemia 09/28/2012  . COPD (chronic obstructive pulmonary disease) ([place]) 05/22/2012  . Frequent falls 05/22/2012  . Osteoporosis 05/22/2012    Current Outpatient Prescriptions on File Prior to Visit  Medication Sig Dispense Refill  . acetaminophen (TYLENOL) 500 MG tablet Take 1,000 mg by mouth every 6 (six) hours as needed (For burn pain.).    [name] ALPRAZolam (XANAX) 0.5 MG tablet TAKE 1 TABLET BY MOUTH EVERY NIGHT AT BEDTIME AS NEEDED FOR ANXIETY 30 tablet 0  . amLODipine (NORVASC) 5 MG tablet TAKE 1 TABLET(5 MG) BY MOUTH DAILY 90 tablet 2  . atorvastatin (LIPITOR) 40 MG tablet TAKE 1 TABLET(40 MG) BY MOUTH  EVERY MORNING 90 tablet 1  . calcium-vitamin D (OSCAL WITH D) 500-200 MG-UNIT per tablet Take 1 tablet by mouth daily with breakfast.     . cholecalciferol (VITAMIN D) 1000 UNITS tablet Take 1,000 Units by mouth daily.    . Cyanocobalamin (VITAMIN B-12) 2500 MCG SUBL Place 1 tablet under the tongue daily.    . ferrous sulfate 325 (65 FE) MG tablet Take 1 tablet (325 mg total) by mouth daily with breakfast. 30 tablet 3  . gabapentin (NEURONTIN) 300 MG capsule Take 1 capsule (300 mg total) by mouth at bedtime. 30 capsule 3  . hydrochlorothiazide (HYDRODIURIL) 25 MG tablet TAKE 1 TABLET(25 MG) BY MOUTH EVERY MORNING 90 tablet 2  . ibuprofen (ADVIL,MOTRIN) 200 MG tablet Take 200-400 mg by mouth every 6 (six) hours as needed for moderate pain.    . Ibuprofen-Famotidine 800-26.6 MG TABS TAKE 1 TABLET DAILY AS NEEDED. 270 tablet 3  . lisinopril (PRINIVIL,ZESTRIL) 40 MG tablet TAKE 1 TABLET(40 MG) BY MOUTH EVERY MORNING 90 tablet 2  . Magnesium 100 MG CAPS Take 100 mg by mouth daily.    . meloxicam (MOBIC) 15 MG tablet Take 1 tablet (15 mg total) by mouth daily as needed for pain. 10 tablet 0  . potassium chloride (KLOR-CON) 8 MEQ tablet Take 2 tablets (16 mEq total) by mouth daily. 60 tablet 3  . tiZANidine (ZANAFLEX) 4 MG tablet Take 1 tablet (4 mg total) by mouth Nightly. 30 tablet 2  . TURMERIC PO Take 1 tablet by mouth daily.    [name]  Vitamin D, Ergocalciferol, (DRISDOL) 50000 units CAPS capsule Take 1 capsule (50,000 Units total) by mouth every 7 (seven) days. 12 capsule 0   No current facility-administered medications on file prior to visit.     Past Medical History:  Diagnosis Date  . Arthritis   . Bursitis    left elbow  . Clostridium difficile infection   . Frequent falls   . HLD (hyperlipidemia)   . Hypertension   . Infectious colitis     Past Surgical History:  Procedure Laterality Date  . COLONOSCOPY    . ORIF PROXIMAL TIBIAL PLATEAU FRACTURE Left 02/19/2012  . POLYPECTOMY    .  TONSILLECTOMY      Social History   Social History  . Marital status: Divorced    Spouse name: N/A  . Number of children: 2  . Years of education: N/A   Occupational History  . RETIRED    Social History Main Topics  . Smoking status: Former Smoker    Packs/day: 0.25    Years: 50.00    Types: Cigarettes    Quit date: 08/28/2014  . Smokeless tobacco: Never Used     Comment: 1 pack every 3 days  . Alcohol use No  . Drug use: No  . Sexual activity: Not Asked   Other Topics Concern  . None   Social History Narrative   Grandson (30 y/o) lives with patient.     Son lives in [place]   Daughter lives in [place]   She is originally from [place], [place]   Divorced             Family History  Problem Relation Age of Onset  . Diverticulosis Mother   . Ovarian cancer Mother   . Pancreatic cancer Father   . Lung cancer Father   . Colon polyps Sister   . Colon cancer Neg Hx     Review of Systems  Constitutional: Positive for diaphoresis and fatigue. Negative for appetite change, chills, fever and unexpected weight change.  Respiratory: Negative for cough, shortness of breath and wheezing.   Cardiovascular: Negative for chest pain, palpitations and leg swelling.  Musculoskeletal: Negative for myalgias.  Neurological: Negative for light-headedness and headaches.       Objective:   Vitals:   09/16/16 1347  BP: 124/68  Pulse: (!) 115  Resp: 16  Temp: 99.5 F (37.5 C)  SpO2: 96%   Wt Readings from Last 3 Encounters:  09/16/16 167 lb (75.8 kg)  08/24/16 166 lb (75.3 kg)  08/18/16 170 lb (77.1 kg)   Body mass index is 27.79 kg/m.   Physical Exam    Constitutional: Appears well-developed and well-nourished. No distress.  HENT:  Head: Normocephalic and atraumatic.  Neck: Neck supple. No tracheal deviation present. No thyromegaly present.  No cervical lymphadenopathy Cardiovascular: Normal rate, regular rhythm and normal heart sounds.   2/6 systolic murmur  heard. No carotid bruit .  No edema Pulmonary/Chest: Effort normal and breath sounds normal. No respiratory distress. No has no wheezes. No rales.  Skin: Skin is warm and dry. Not diaphoretic.  Psychiatric: Normal mood and affect. Behavior is normal.      Assessment & Plan:    See Problem List for Assessment and Plan of chronic medical problems.   FU in 6 months

## 2016-10-25 ENCOUNTER — Telehealth: Payer: Self-pay | Admitting: Internal Medicine

## 2016-10-25 ENCOUNTER — Ambulatory Visit (INDEPENDENT_AMBULATORY_CARE_PROVIDER_SITE_OTHER): Payer: Medicare Other | Admitting: Internal Medicine

## 2016-10-25 ENCOUNTER — Encounter: Payer: Self-pay | Admitting: Internal Medicine

## 2016-10-25 DIAGNOSIS — J069 Acute upper respiratory infection, unspecified: Secondary | ICD-10-CM | POA: Diagnosis not present

## 2016-10-25 MED ORDER — FLUTICASONE PROPIONATE 50 MCG/ACT NA SUSP: 2.0000 | Freq: Every day | NASAL | 0 refills | Status: DC

## 2016-10-25 NOTE — Progress Notes (Signed)
   Subjective:    Patient ID: Kristina Ayala, female    DOB: 1945/05/18, 71 y.o.   MRN: [id number]  HPI The patient is a 71 YO female coming in for ear pain in her ears as well as sinus congestion. Started on Friday and is getting some better today. She has taken some otc cold medicine. She denies fevers but having some chills. Denies much cough and no SOB. No sputum. Some nose drainage. The ear pressure is getting better and hearing is the same. Is not taking any allergy medicine. Does have mild seasonal allergies.   Review of Systems  Constitutional: Positive for chills. Negative for activity change, appetite change, fatigue, fever and unexpected weight change.  HENT: Positive for congestion, postnasal drip, rhinorrhea and sinus pressure. Negative for dental problem, drooling, ear discharge, ear pain, facial swelling, sinus pain, sneezing, sore throat, tinnitus, trouble swallowing and voice change.   Eyes: Negative.   Respiratory: Negative.   Cardiovascular: Negative.   Gastrointestinal: Negative.   Musculoskeletal: Negative.   Skin: Negative.       Objective:   Physical Exam  Constitutional: She appears well-developed and well-nourished.  HENT:  Head: Normocephalic and atraumatic.  Oropharynx with redness and mild clear drainage  Eyes: EOM are normal.  Neck: Normal range of motion.  Cardiovascular: Normal rate and regular rhythm.   Pulmonary/Chest: Effort normal and breath sounds normal. No respiratory distress. She has no wheezes. She has no rales.  Abdominal: Soft. She exhibits no distension. There is no tenderness. There is no rebound.  Lymphadenopathy:    She has no cervical adenopathy.  Neurological: Coordination normal.  Skin: Skin is warm and dry.   Vitals:   10/25/16 1451  BP: 126/80  Pulse: 84  Temp: 98.8 F (37.1 C)  TempSrc: Oral  SpO2: 100%  Weight: 165 lb (74.8 kg)  Height: 5\' 5"  (1.651 m)      Assessment & Plan:

## 2016-10-25 NOTE — Telephone Encounter (Signed)
[place] [facility] [facility] - Client [facility]  Patient Name: Kristina Ayala  DOB: 06-19-1945    Initial Comment head and jaw hurting, runny nose, sore throat, ears hurt   Nurse Assessment  Nurse: Harlow Mares, RN, Suanne Marker Date/Time (Eastern Time): 10/25/2016 8:40:47 AM  Confirm and document reason for call. If symptomatic, describe symptoms. ---head and jaw hurting, runny nose, sore throat, ears hurt. Post nasal drip. eyes are swollen. Symptoms began on Friday. No reported fever.  Does the patient have any new or worsening symptoms? ---Yes  Will a triage be completed? ---Yes  Related visit to physician within the last 2 weeks? ---No  Does the PT have any chronic conditions? (i.e. diabetes, asthma, etc.) ---Yes  List chronic conditions. ---HTN;  Is this a behavioral health or substance abuse call? ---No     Guidelines    Guideline Title Affirmed Question Affirmed Notes  Earache Earache (Exceptions: brief ear pain of < 60 minutes duration, earache occurring during air travel    Final Disposition User   See Physician within [place], RN, Suanne Marker    Comments  Scheduled caller to see Pricilla Holm for today at the [facility] office at 2:45pm.   Referrals  REFERRED TO PCP OFFICE   Caller Disagree/Comply Comply  Caller Understands Yes  PreDisposition Call Doctor

## 2016-10-25 NOTE — Patient Instructions (Signed)
We have sent in the flonase for the congestion. Use 2 sprays in each nose daily for the next 1-2 weeks.   Allergic Rhinitis Allergic rhinitis is when the mucous membranes in the nose respond to allergens. Allergens are particles in the air that cause your body to have an allergic reaction. This causes you to release allergic antibodies. Through a chain of events, these eventually cause you to release histamine into the blood stream. Although meant to protect the body, it is this release of histamine that causes your discomfort, such as frequent sneezing, congestion, and an itchy, runny nose. What are the causes? Seasonal allergic rhinitis (hay fever) is caused by pollen allergens that may come from grasses, trees, and weeds. Year-round allergic rhinitis (perennial allergic rhinitis) is caused by allergens such as house dust mites, pet dander, and mold spores. What are the signs or symptoms?  Nasal stuffiness (congestion).  Itchy, runny nose with sneezing and tearing of the eyes. How is this diagnosed? Your health care provider can help you determine the allergen or allergens that trigger your symptoms. If you and your health care provider are unable to determine the allergen, skin or blood testing may be used. Your health care provider will diagnose your condition after taking your health history and performing a physical exam. Your health care provider may assess you for other related conditions, such as asthma, pink eye, or an ear infection. How is this treated? Allergic rhinitis does not have a cure, but it can be controlled by:  Medicines that block allergy symptoms. These may include allergy shots, nasal sprays, and oral antihistamines.  Avoiding the allergen.  Hay fever may often be treated with antihistamines in pill or nasal spray forms. Antihistamines block the effects of histamine. There are over-the-counter medicines that may help with nasal congestion and swelling around the eyes.  Check with your health care provider before taking or giving this medicine. If avoiding the allergen or the medicine prescribed do not work, there are many new medicines your health care provider can prescribe. Stronger medicine may be used if initial measures are ineffective. Desensitizing injections can be used if medicine and avoidance does not work. Desensitization is when a patient is given ongoing shots until the body becomes less sensitive to the allergen. Make sure you follow up with your health care provider if problems continue. Follow these instructions at home: It is not possible to completely avoid allergens, but you can reduce your symptoms by taking steps to limit your exposure to them. It helps to know exactly what you are allergic to so that you can avoid your specific triggers. Contact a health care provider if:  You have a fever.  You develop a cough that does not stop easily (persistent).  You have shortness of breath.  You start wheezing.  Symptoms interfere with normal daily activities. This information is not intended to replace advice given to you by your health care provider. Make sure you discuss any questions you have with your health care provider. Document Released: 10/06/2000 Document Revised: 09/12/2015 Document Reviewed: 09/18/2012 Elsevier Interactive Patient Education  2017 [employer].

## 2016-10-26 NOTE — Assessment & Plan Note (Signed)
Symptoms are mild and resolving. Advised zyrtec otc for symptoms and can continue with otc cold medication. No indication for antibiotics or steroids today.

## 2016-11-10 ENCOUNTER — Ambulatory Visit (INDEPENDENT_AMBULATORY_CARE_PROVIDER_SITE_OTHER): Payer: Medicare Other

## 2016-11-10 DIAGNOSIS — Z23 Encounter for immunization: Secondary | ICD-10-CM

## 2016-11-29 ENCOUNTER — Other Ambulatory Visit: Payer: Self-pay | Admitting: Internal Medicine

## 2016-11-29 ENCOUNTER — Other Ambulatory Visit: Payer: Self-pay | Admitting: Family Medicine

## 2016-11-29 NOTE — Telephone Encounter (Signed)
[place] Controlled Substance Database checked. Last filled on 09/16/16

## 2016-11-30 NOTE — Telephone Encounter (Signed)
RX faxed to [facility] 

## 2016-12-01 ENCOUNTER — Ambulatory Visit: Payer: Medicare Other | Admitting: Family Medicine

## 2016-12-01 ENCOUNTER — Ambulatory Visit (INDEPENDENT_AMBULATORY_CARE_PROVIDER_SITE_OTHER): Payer: Medicare Other | Admitting: Internal Medicine

## 2016-12-01 ENCOUNTER — Other Ambulatory Visit (INDEPENDENT_AMBULATORY_CARE_PROVIDER_SITE_OTHER): Payer: Medicare Other

## 2016-12-01 ENCOUNTER — Encounter: Payer: Self-pay | Admitting: Family Medicine

## 2016-12-01 ENCOUNTER — Encounter: Payer: Self-pay | Admitting: Internal Medicine

## 2016-12-01 ENCOUNTER — Ambulatory Visit (INDEPENDENT_AMBULATORY_CARE_PROVIDER_SITE_OTHER)
Admission: RE | Admit: 2016-12-01 | Discharge: 2016-12-01 | Disposition: A | Discharge status: Home / Self Care | Payer: Medicare Other | Source: Ambulatory Visit | Attending: Family Medicine | Admitting: Family Medicine

## 2016-12-01 VITALS — BP 140/70 | HR 73 | Ht 65.0 in | Wt 161.0 lb

## 2016-12-01 VITALS — BP 112/68 | HR 85 | Temp 99.0°F | Resp 16 | Ht 65.0 in | Wt 160.0 lb

## 2016-12-01 DIAGNOSIS — R748 Abnormal levels of other serum enzymes: Secondary | ICD-10-CM

## 2016-12-01 DIAGNOSIS — F419 Anxiety disorder, unspecified: Secondary | ICD-10-CM | POA: Insufficient documentation

## 2016-12-01 DIAGNOSIS — M1712 Unilateral primary osteoarthritis, left knee: Secondary | ICD-10-CM | POA: Diagnosis not present

## 2016-12-01 DIAGNOSIS — M25562 Pain in left knee: Secondary | ICD-10-CM | POA: Diagnosis not present

## 2016-12-01 DIAGNOSIS — R634 Abnormal weight loss: Secondary | ICD-10-CM

## 2016-12-01 DIAGNOSIS — L0591 Pilonidal cyst without abscess: Secondary | ICD-10-CM

## 2016-12-01 DIAGNOSIS — F4321 Adjustment disorder with depressed mood: Secondary | ICD-10-CM

## 2016-12-01 LAB — COMPREHENSIVE METABOLIC PANEL
ALK PHOS: 45 U/L (ref 39–117)
ALT: 27 U/L (ref 0–35)
AST: 18 U/L (ref 0–37)
Albumin: 4.5 g/dL (ref 3.5–5.2)
BILIRUBIN TOTAL: 0.5 mg/dL (ref 0.2–1.2)
BUN: 19 mg/dL (ref 6–23)
CHLORIDE: 103 meq/L (ref 96–112)
CO2: 33 meq/L — AB (ref 19–32)
Calcium: 10.2 mg/dL (ref 8.4–10.5)
Creatinine, Ser: 0.8 mg/dL (ref 0.40–1.20)
GFR: 90.49 mL/min (ref >60.00)
GLUCOSE: 98 mg/dL (ref 70–99)
POTASSIUM: 3.6 meq/L (ref 3.5–5.1)
SODIUM: 144 meq/L (ref 135–145)
Total Protein: 7.5 g/dL (ref 6.0–8.3)

## 2016-12-01 LAB — CBC WITH DIFFERENTIAL/PLATELET
BASOS PCT: 1.6 % (ref 0.0–3.0)
Basophils Absolute: 0.1 10*3/uL (ref 0.0–0.1)
EOS PCT: 1.7 % (ref 0.0–5.0)
Eosinophils Absolute: 0.1 10*3/uL (ref 0.0–0.7)
HCT: 42.1 % (ref 36.0–46.0)
Hemoglobin: 13.4 g/dL (ref 12.0–15.0)
LYMPHS ABS: 2.6 10*3/uL (ref 0.7–4.0)
Lymphocytes Relative: 40.4 % (ref 12.0–46.0)
MCHC: 31.9 g/dL (ref 30.0–36.0)
MCV: 84.3 fl (ref 78.0–100.0)
MONO ABS: 0.7 10*3/uL (ref 0.1–1.0)
Monocytes Relative: 11.3 % (ref 3.0–12.0)
NEUTROS PCT: 45 % (ref 43.0–77.0)
Neutro Abs: 2.9 10*3/uL (ref 1.4–7.7)
PLATELETS: 291 10*3/uL (ref 150.0–400.0)
RBC: 4.99 Mil/uL (ref 3.87–5.11)
RDW: 15.5 % (ref 11.5–15.5)
WBC: 6.4 10*3/uL (ref 4.0–10.5)

## 2016-12-01 LAB — URINALYSIS, ROUTINE W REFLEX MICROSCOPIC
BILIRUBIN URINE: NEGATIVE
Hgb urine dipstick: NEGATIVE
KETONES UR: NEGATIVE
Leukocytes, UA: NEGATIVE
Nitrite: NEGATIVE
RBC / HPF: NONE SEEN (ref 0–?)
Total Protein, Urine: NEGATIVE
UROBILINOGEN UA: 0.2 (ref 0.0–1.0)
Urine Glucose: NEGATIVE
pH: 5.5 (ref 5.0–8.0)

## 2016-12-01 LAB — THYROID PANEL WITH TSH
FREE THYROXINE INDEX: 2.7 (ref 1.4–3.8)
T3 UPTAKE: 26 % (ref 22–35)
T4 TOTAL: 10.5 ug/dL (ref 5.1–11.9)
TSH: 0.41 mIU/L (ref 0.40–4.50)

## 2016-12-01 LAB — LIPASE: Lipase: 122 U/L — ABNORMAL HIGH (ref 11.0–59.0)

## 2016-12-01 MED ORDER — MIRTAZAPINE 7.5 MG PO TABS: 7.5000 mg | ORAL_TABLET | Freq: Every day | ORAL | 0 refills | Status: DC

## 2016-12-01 NOTE — Patient Instructions (Signed)
Major Depressive Disorder, Adult Major depressive disorder (MDD) is a mental health condition. It may also be called clinical depression or unipolar depression. MDD usually causes feelings of sadness, hopelessness, or helplessness. MDD can also cause physical symptoms. It can interfere with work, school, relationships, and other everyday activities. MDD may be mild, moderate, or severe. It may occur once (single episode major depressive disorder) or it may occur multiple times (recurrent major depressive disorder). What are the causes? The exact cause of this condition is not known. MDD is most likely caused by a combination of things, which may include:  Genetic factors. These are traits that are passed along from parent to child.  Individual factors. Your personality, your behavior, and the way you handle your thoughts and feelings may contribute to MDD. This includes personality traits and behaviors learned from others.  Physical factors, such as: ? Differences in the part of your brain that controls emotion. This part of your brain may be different than it is in people who do not have MDD. ? Long-term (chronic) medical or psychiatric illnesses.  Social factors. Traumatic experiences or major life changes may play a role in the development of MDD.  What increases the risk? This condition is more likely to develop in women. The following factors may also make you more likely to develop MDD:  A family history of depression.  Troubled family relationships.  Abnormally low levels of certain brain chemicals.  Traumatic events in childhood, especially abuse or the loss of a parent.  Being under a lot of stress, or long-term stress, especially from upsetting life experiences or losses.  A history of: ? Chronic physical illness. ? Other mental health disorders. ? Substance abuse.  Poor living conditions.  Experiencing social exclusion or discrimination on a regular basis.  What are  the signs or symptoms? The main symptoms of MDD typically include:  Constant depressed or irritable mood.  Loss of interest in things and activities.  MDD symptoms may also include:  Sleeping or eating too much or too little.  Unexplained weight change.  Fatigue or low energy.  Feelings of worthlessness or guilt.  Difficulty thinking clearly or making decisions.  Thoughts of suicide or of harming others.  Physical agitation or weakness.  Isolation.  Severe cases of MDD may also occur with other symptoms, such as:  Delusions or hallucinations, in which you imagine things that are not real (psychotic depression).  Low-level depression that lasts at least a year (chronic depression or persistent depressive disorder).  Extreme sadness and hopelessness (melancholic depression).  Trouble speaking and moving (catatonic depression).  How is this diagnosed? This condition may be diagnosed based on:  Your symptoms.  Your medical history, including your mental health history. This may involve tests to evaluate your mental health. You may be asked questions about your lifestyle, including any drug and alcohol use, and how long you have had symptoms of MDD.  A physical exam.  Blood tests to rule out other conditions.  You must have a depressed mood and at least four other MDD symptoms most of the day, nearly every day in the same 2-week timeframe before your health care provider can confirm a diagnosis of MDD. How is this treated? This condition is usually treated by mental health professionals, such as psychologists, psychiatrists, and clinical social workers. You may need more than one type of treatment. Treatment may include:  Psychotherapy. This is also called talk therapy or counseling. Types of psychotherapy include: ? Cognitive behavioral   therapy (CBT). This type of therapy teaches you to recognize unhealthy feelings, thoughts, and behaviors, and replace them with  positive thoughts and actions. ? Interpersonal therapy (IPT). This helps you to improve the way you relate to and communicate with others. ? Family therapy. This treatment includes members of your family.  Medicine to treat anxiety and depression, or to help you control certain emotions and behaviors.  Lifestyle changes, such as: ? Limiting alcohol and drug use. ? Exercising regularly. ? Getting plenty of sleep. ? Making healthy eating choices. ? Spending more time outdoors.  Treatments involving stimulation of the brain can be used in situations with extremely severe symptoms, or when medicine or other therapies do not work over time. These treatments include electroconvulsive therapy, transcranial magnetic stimulation, and vagal nerve stimulation. Follow these instructions at home: Activity  Return to your normal activities as told by your health care provider.  Exercise regularly and spend time outdoors as told by your health care provider. General instructions  Take over-the-counter and prescription medicines only as told by your health care provider.  Do not drink alcohol. If you drink alcohol, limit your alcohol intake to no more than 1 drink a day for nonpregnant women and 2 drinks a day for men. One drink equals 12 oz of beer, 5 oz of wine, or 1 oz of hard liquor. Alcohol can affect any antidepressant medicines you are taking. Talk to your health care provider about your alcohol use.  Eat a healthy diet and get plenty of sleep.  Find activities that you enjoy doing, and make time to do them.  Consider joining a support group. Your health care provider may be able to recommend a support group.  Keep all follow-up visits as told by your health care provider. This is important. Where to find more information: National Alliance on Mental Illness  www.nami.org  U.S. National Institute of Mental Health  www.nimh.nih.gov  National Suicide Prevention  Lifeline  1-800-273-TALK (8255). This is free, 24-hour help.  Contact a health care provider if:  Your symptoms get worse.  You develop new symptoms. Get help right away if:  You self-harm.  You have serious thoughts about hurting yourself or others.  You see, hear, taste, smell, or feel things that are not present (hallucinate). This information is not intended to replace advice given to you by your health care provider. Make sure you discuss any questions you have with your health care provider. Document Released: 05/08/2012 Document Revised: 09/18/2015 Document Reviewed: 07/23/2015 Elsevier Interactive Patient Education  2017 [employer] Inc.  

## 2016-12-01 NOTE — Progress Notes (Signed)
[name] [place], [place] [address] Phone: [phone] Subjective:    I'm seeing this patient by the request  of:    CC: Pain left knee pain  GGY:IRSWNIOEVO  Kristina Ayala is a 71 y.o. female coming in with complaint of left knee pain.  She was seen previously and diagnosed open reduction internal fixation.  Patient is having worsening pain at the moment.  Describes it as a dull, throbbing aching sensation.  States that this is the worst it has ever been.  Having significant trouble with some increasing instability and potentially swelling.  Affecting daily activities such as walking continue to be tender at night.       Past Medical History:  Diagnosis Date  . Arthritis   . Bursitis    left elbow  . Clostridium difficile infection   . Frequent falls   . HLD (hyperlipidemia)   . Hypertension   . Infectious colitis    Past Surgical History:  Procedure Laterality Date  . COLONOSCOPY    . ORIF PROXIMAL TIBIAL PLATEAU FRACTURE Left 02/19/2012  . POLYPECTOMY    . TONSILLECTOMY     Social History   Socioeconomic History  . Marital status: Divorced    Spouse name: None  . Number of children: 2  . Years of education: None  . Highest education level: None  Social Needs  . Financial resource strain: None  . Food insecurity - worry: None  . Food insecurity - inability: None  . Transportation needs - medical: None  . Transportation needs - non-medical: None  Occupational History  . Occupation: RETIRED  Tobacco Use  . Smoking status: Former Smoker    Packs/day: 0.25    Years: 50.00    Pack years: 12.50    Types: Cigarettes    Last attempt to quit: 08/28/2014    Years since quitting: 2.2  . Smokeless tobacco: Never Used  . Tobacco comment: 1 pack every 3 days  Substance and Sexual Activity  . Alcohol use: No    Alcohol/week: 0.0 oz  . Drug use: No  . Sexual activity: None  Other Topics Concern  . None  Social  History Narrative   Grandson (82 y/o) lives with patient.     Son lives in [place]   Daughter lives in [place]   She is originally from [place], [place]   Divorced            Allergies  Allergen Reactions  . Doxycycline Diarrhea  . Minocycline Diarrhea  . Wellbutrin [Bupropion] Swelling    Per pt her tongue was swollen and sore/symptoms stopped once the medication was discontinued.   [name] Penicillins Rash    Has patient had a PCN reaction causing immediate rash, facial/tongue/throat swelling, SOB or lightheadedness with hypotension: yes Has patient had a PCN reaction causing severe rash involving mucus membranes or skin necrosis: no Has patient had a PCN reaction that required hospitalization: unknown Has patient had a PCN reaction occurring within the last 10 years: no If all of the above answers are "NO", then may proceed with Cephalosporin use.    Family History  Problem Relation Age of Onset  . Diverticulosis Mother   . Ovarian cancer Mother   . Pancreatic cancer Father   . Lung cancer Father   . Colon polyps Sister   . Colon cancer Neg Hx      Past medical history, social, surgical and family history all reviewed in electronic  medical record.  No pertanent information unless stated regarding to the chief complaint.   Review of Systems:Review of systems updated and as accurate as of 12/01/16  No headache, visual changes, nausea, vomiting, diarrhea, constipation, dizziness, abdominal pain, skin rash, fevers, chills, night sweats, swollen lymph nodes, body aches, joint swelling,  chest pain, shortness of breath, mood changes.  Positive muscle aches and weight loss  Objective  Blood pressure 140/70, pulse 73, height 5\' 5"  (1.651 m), weight 161 lb (73 kg), SpO2 96 %. Systems examined below as of 12/01/16   General: No apparent distress alert and oriented x3 mood and affect normal, dressed appropriately.  HEENT: Pupils equal, extraocular movements intact  Respiratory:  Patient's speak in full sentences and does not appear short of breath  Cardiovascular: No lower extremity edema, non tender, no erythema  Skin: Warm dry intact with no signs of infection or rash on extremities or on axial skeleton.  Abdomen: Soft nontender  Neuro: Cranial nerves II through XII are intact, neurovascularly intact in all extremities with 2+ DTRs and 2+ pulses.  Lymph: No lymphadenopathy of posterior or anterior cervical chain or axillae bilaterally.  Gait mild antalgic.  MSK:  Non tender with full range of motion and good stability and symmetric strength and tone of shoulders, elbows, wrist, hip, and ankles bilaterally.  Knee: Left valgus deformity noted.  Abnormal thigh to calf ratio.  Tender to palpation over medial and PF joint line.  ROM full in flexion and extension and lower leg rotation. instability with valgus force.  painful patellar compression. Patellar glide with mild crepitus. Patellar and quadriceps tendons unremarkable. Hamstring and quadriceps strength is normal. Contralateral knee shows no arthritic changes.  After informed written and verbal consent, patient was seated on exam table. Left knee was prepped with alcohol swab and utilizing anterolateral approach, patient's left knee space was injected with 4:1  marcaine 0.5%: Kenalog 40mg /dL. Patient tolerated the procedure well without immediate complications.   Impression and Recommendations:     This case required medical decision making of moderate complexity.      Note: This dictation was prepared with Dragon dictation along with smaller phrase technology. Any transcriptional errors that result from this process are unintentional.

## 2016-12-01 NOTE — Patient Instructions (Signed)
Good to see you  I am sorry you have a lot going on  We will get xray downstairs We will consider labs but lets see what Dr. Ronnald Ramp says. Ask to check a vitamin D level as well  pennsaid pinkie amount topically 2 times daily as needed.   See me again in 4-6 weeks

## 2016-12-01 NOTE — Progress Notes (Signed)
Subjective:  Patient ID: Kristina Ayala, female    DOB: [birth year]  Age: 71 y.o. MRN: [id number]  CC: Weight Loss  NEW TO ME  HPI Kristina Ayala presents for concerns about a 6-week history of weight loss and loss of appetite.  She attributes the symptoms to the fact that her daughter and grandchildren have moved in with her and this was a stressful event for her.  She has lost so much weight that she feels weak and fatigued.  She denies abdominal pain, nausea, vomiting, odynophagia, or dysphagia.  She complains of mild arthralgias but denies myalgias, rash, or paresthesias.  She admits to mild anhedonia.  She complains of an uncomfortable cystic area at the top of her tailbone that she has been putting an ointment on.  Outpatient Medications Prior to Visit  Medication Sig Dispense Refill  . acetaminophen (TYLENOL) 500 MG tablet Take 1,000 mg by mouth every 6 (six) hours as needed (For burn pain.).    [name] ALPRAZolam (XANAX) 0.5 MG tablet TAKE 1 TABLET BY MOUTH EVERY NIGHT AT BEDTIME AS NEEDED FOR ANXIETY 30 tablet 0  . amLODipine (NORVASC) 5 MG tablet TAKE 1 TABLET(5 MG) BY MOUTH DAILY 90 tablet 2  . atorvastatin (LIPITOR) 40 MG tablet TAKE 1 TABLET(40 MG) BY MOUTH EVERY MORNING 90 tablet 1  . calcium-vitamin D (OSCAL WITH D) 500-200 MG-UNIT per tablet Take 1 tablet by mouth daily with breakfast.     . cholecalciferol (VITAMIN D) 1000 UNITS tablet Take 1,000 Units by mouth daily.    . Cyanocobalamin (VITAMIN B-12) 2500 MCG SUBL Place 1 tablet under the tongue daily.    . ferrous sulfate 325 (65 FE) MG tablet Take 1 tablet (325 mg total) by mouth daily with breakfast. 30 tablet 3  . fluticasone (FLONASE) 50 MCG/ACT nasal spray Place 2 sprays into both nostrils daily. 16 g 0  . gabapentin (NEURONTIN) 300 MG capsule Take 1 capsule (300 mg total) by mouth at bedtime. 30 capsule 3  . hydrochlorothiazide (HYDRODIURIL) 25 MG tablet TAKE 1 TABLET(25 MG) BY MOUTH EVERY MORNING 90 tablet 2  .  ibuprofen (ADVIL,MOTRIN) 200 MG tablet Take 200-400 mg by mouth every 6 (six) hours as needed for moderate pain.    . Ibuprofen-Famotidine 800-26.6 MG TABS TAKE 1 TABLET DAILY AS NEEDED. 270 tablet 3  . lisinopril (PRINIVIL,ZESTRIL) 40 MG tablet TAKE 1 TABLET(40 MG) BY MOUTH EVERY MORNING 90 tablet 2  . Magnesium 100 MG CAPS Take 100 mg by mouth daily.    . meloxicam (MOBIC) 15 MG tablet Take 1 tablet (15 mg total) by mouth daily as needed for pain. 10 tablet 0  . potassium chloride (KLOR-CON) 8 MEQ tablet Take 2 tablets (16 mEq total) by mouth daily. 60 tablet 3  . tiZANidine (ZANAFLEX) 4 MG tablet Take 1 tablet (4 mg total) by mouth Nightly. 30 tablet 2  . TURMERIC PO Take 1 tablet by mouth daily.    . Vitamin D, Ergocalciferol, (DRISDOL) 50000 units CAPS capsule Take 1 capsule (50,000 Units total) by mouth every 7 (seven) days. 12 capsule 0   No facility-administered medications prior to visit.     ROS Review of Systems  Constitutional: Positive for appetite change, fatigue and unexpected weight change. Negative for activity change, chills and fever.  HENT: Negative.  Negative for trouble swallowing.   Eyes: Negative.   Respiratory: Negative.  Negative for cough, chest tightness and shortness of breath.   Cardiovascular: Negative.  Negative for chest  pain, palpitations and leg swelling.  Gastrointestinal: Negative for abdominal pain, blood in stool, constipation, diarrhea, nausea and vomiting.  Endocrine: Negative.   Genitourinary: Negative.  Negative for difficulty urinating, dysuria and hematuria.  Musculoskeletal: Positive for arthralgias. Negative for back pain and myalgias.  Skin: Negative.  Negative for color change and rash.  Allergic/Immunologic: Negative.   Neurological: Positive for weakness. Negative for dizziness, numbness and headaches.  Hematological: Negative for adenopathy. Does not bruise/bleed easily.  Psychiatric/Behavioral: Positive for dysphoric mood. Negative for  confusion, self-injury, sleep disturbance and suicidal ideas. The patient is nervous/anxious.     Objective:  BP 112/68 (BP Location: Left Arm, Patient Position: Sitting, Cuff Size: Normal)   Pulse 85   Temp 99 F (37.2 C) (Oral)   Resp 16   Ht 5\' 5"  (1.651 m)   Wt 160 lb (72.6 kg)   SpO2 99%   BMI 26.63 kg/m   BP Readings from Last 3 Encounters:  12/01/16 112/68  12/01/16 140/70  10/25/16 126/80    Wt Readings from Last 3 Encounters:  12/01/16 160 lb (72.6 kg)  12/01/16 161 lb (73 kg)  10/25/16 165 lb (74.8 kg)    Physical Exam  Constitutional: She is oriented to person, place, and time. No distress.  HENT:  Mouth/Throat: Oropharynx is clear and moist.  Eyes: Conjunctivae are normal. Right eye exhibits no discharge. Left eye exhibits no discharge. No scleral icterus.  Neck: Normal range of motion. Neck supple. No JVD present. No thyromegaly present.  Cardiovascular: Normal rate, regular rhythm and intact distal pulses. Exam reveals no gallop and no friction rub.  No murmur heard. Pulmonary/Chest: Effort normal and breath sounds normal. No respiratory distress. She has no wheezes. She has no rales. She exhibits no tenderness.  Abdominal: Soft. Bowel sounds are normal. She exhibits no distension and no mass. There is no tenderness. There is no rebound and no guarding.  Musculoskeletal: Normal range of motion. She exhibits no edema, tenderness or deformity.       Back:  Lymphadenopathy:    She has no cervical adenopathy.  Neurological: She is alert and oriented to person, place, and time.  Skin: Skin is warm and dry. No rash noted. She is not diaphoretic. No erythema. No pallor.  Psychiatric: She has a normal mood and affect. Her behavior is normal. Judgment and thought content normal.  Vitals reviewed.   Lab Results  Component Value Date   WBC 6.4 12/01/2016   HGB 13.4 12/01/2016   HCT 42.1 12/01/2016   PLT 291.0 12/01/2016   GLUCOSE 98 12/01/2016   CHOL 171  03/03/2016   TRIG 100.0 03/03/2016   HDL 52.10 03/03/2016   LDLDIRECT 213.1 05/22/2012   LDLCALC 99 03/03/2016   ALT 27 12/01/2016   AST 18 12/01/2016   NA 144 12/01/2016   K 3.6 12/01/2016   CL 103 12/01/2016   CREATININE 0.80 12/01/2016   BUN 19 12/01/2016   CO2 33 (H) 12/01/2016   TSH 0.41 12/01/2016   HGBA1C 6.0 09/16/2016    No results found.  Assessment & Plan:   Nekia was seen today for weight loss.  Diagnoses and all orders for this visit:  Pilonidal cyst -     Ambulatory referral to General Surgery  Adjustment disorder with depressed mood- Will try mirtazapine to treat the symptoms and to help her with her appetite and weight loss. -     mirtazapine (REMERON) 7.5 MG tablet; Take 1 tablet (7.5 mg total) at bedtime by  mouth.  Weight loss, non-intentional- her symptoms are nonspecific and her exam is nonfocal.  Her labs are normal with the exception of a mildly elevated lipase.  This is concerning for pancreatic cause such as mass, cyst, or carcinoma.  I have asked her to undergo an MRI to screen for pancreatic lesions. -     Comprehensive metabolic panel; Future -     CBC with Differential/Platelet; Future -     Lipase; Future -     Thyroid Panel With TSH; Future -     Urinalysis, Routine w reflex microscopic; Future -     MR Abdomen W Wo Contrast; Future  Elevated amylase- as above -     MR Abdomen W Wo Contrast; Future   I am having Cathren A. Vasques start on mirtazapine. I am also having her maintain her calcium-vitamin D, Vitamin B-12, ferrous sulfate, cholecalciferol, acetaminophen, Magnesium, TURMERIC PO, Ibuprofen-Famotidine, ibuprofen, potassium chloride, hydrochlorothiazide, amLODipine, atorvastatin, lisinopril, meloxicam, gabapentin, tiZANidine, Vitamin D (Ergocalciferol), fluticasone, and ALPRAZolam.  Meds ordered this encounter  Medications  . mirtazapine (REMERON) 7.5 MG tablet    Sig: Take 1 tablet (7.5 mg total) at bedtime by mouth.     Dispense:  90 tablet    Refill:  0     Follow-up: Return in about 6 weeks (around 01/12/2017).  Scarlette Calico, MD

## 2016-12-01 NOTE — Assessment & Plan Note (Signed)
Patient given injection today and tolerated the procedure well.  We discussed icing regimen and home exercises.  We discussed which activities are doing which wants to avoid.  Patient is to increase activity slowly over the course of the next several days.  Patient is seen in the provider for the weight loss.  We discussed other possible laboratory workup but she wants to see with the other provider suggest.  Patient will follow up with me again in 4 weeks.  Could be a candidate for Visco supplementation.  Insurance pending from patient's ORIF

## 2016-12-02 ENCOUNTER — Encounter: Payer: Self-pay | Admitting: Internal Medicine

## 2016-12-03 ENCOUNTER — Telehealth: Payer: Self-pay

## 2016-12-03 MED ORDER — POTASSIUM CHLORIDE ER 8 MEQ PO TBCR: 16.0000 meq | EXTENDED_RELEASE_TABLET | Freq: Every day | ORAL | 3 refills | Status: DC

## 2016-12-03 MED ORDER — VITAMIN D (ERGOCALCIFEROL) 1.25 MG (50000 UNIT) PO CAPS: 50000.0000 [IU] | ORAL_CAPSULE | ORAL | 0 refills | Status: DC

## 2016-12-07 NOTE — Telephone Encounter (Signed)
error 

## 2016-12-13 ENCOUNTER — Other Ambulatory Visit: Payer: Self-pay

## 2016-12-13 ENCOUNTER — Ambulatory Visit (INDEPENDENT_AMBULATORY_CARE_PROVIDER_SITE_OTHER)
Admission: RE | Admit: 2016-12-13 | Discharge: 2016-12-13 | Disposition: A | Discharge status: Home / Self Care | Payer: Medicare Other | Source: Ambulatory Visit | Attending: Family Medicine | Admitting: Family Medicine

## 2016-12-13 ENCOUNTER — Telehealth: Payer: Self-pay | Admitting: Internal Medicine

## 2016-12-13 DIAGNOSIS — M545 Low back pain, unspecified: Secondary | ICD-10-CM

## 2016-12-13 NOTE — Telephone Encounter (Addendum)
Pt fell yesterday at home trying to put on her shoe. Left sided low back pain. Pt request order an xray for today. Please call pt.

## 2016-12-13 NOTE — Telephone Encounter (Signed)
Called patient. She would like an xray of her back taken. Per a verbal from Dr. Tamala Julian, patient needs to get an x-ray of lumber spine. Order placed and patient notified.

## 2016-12-14 ENCOUNTER — Telehealth: Payer: Self-pay

## 2016-12-14 NOTE — Telephone Encounter (Signed)
I have talked with patient to advise that her next prolia is due either on or after dec 1st---patient uses pharmacy [facility]---Ignacia Gentzler will call in rx to [facility] on 11/28---patient wants to pick up rx on dec 3rd and bring to our office for administration that day---Kalimah Capurro will coordinate rx and visit and call patient back to schedule

## 2016-12-22 ENCOUNTER — Telehealth: Payer: Self-pay | Admitting: Internal Medicine

## 2016-12-22 ENCOUNTER — Other Ambulatory Visit: Payer: Self-pay

## 2016-12-22 ENCOUNTER — Telehealth: Payer: Self-pay

## 2016-12-22 MED ORDER — DENOSUMAB 60 MG/ML ~~LOC~~ SOLN: 60.0000 mg | Freq: Once | SUBCUTANEOUS | 0 refills | Status: AC

## 2016-12-22 NOTE — Telephone Encounter (Signed)
Copied from [place] [id number]. Topic: Appointment Scheduling - Scheduling Inquiry for Clinic >> Dec 22, 2016  2:44 PM Aurelio Brash B wrote: Reason for CRM: Pt wants to know if she can have another blood work done again  before her scheduled mri  on Saturday.  Pt is asking to have Dr Quay Burow nurse call her.

## 2016-12-22 NOTE — Telephone Encounter (Signed)
I have called in prolia injection to patient's pharmacy ([facility])---left message asking patient to be expecting call from pharmacy ---she needs to pick med up at pharmacy, keep refrigerated and make nurse visit to come to our office with the med to get the injection--can talk with Cayton Cuevas if any further questions

## 2016-12-22 NOTE — Telephone Encounter (Signed)
LVM with pt, need to know what labs she is wanting drawn. She recently had labs done with Dr Ronnald Ramp on 12/02/16

## 2016-12-25 ENCOUNTER — Other Ambulatory Visit: Payer: Medicare Other

## 2016-12-28 ENCOUNTER — Encounter: Payer: Self-pay | Admitting: Internal Medicine

## 2016-12-28 ENCOUNTER — Ambulatory Visit (INDEPENDENT_AMBULATORY_CARE_PROVIDER_SITE_OTHER): Payer: Medicare Other | Admitting: Internal Medicine

## 2016-12-28 VITALS — BP 142/80 | HR 110 | Temp 99.0°F | Resp 18 | Wt 157.0 lb

## 2016-12-28 DIAGNOSIS — J209 Acute bronchitis, unspecified: Secondary | ICD-10-CM | POA: Insufficient documentation

## 2016-12-28 MED ORDER — CEFDINIR 300 MG PO CAPS: 300.0000 mg | ORAL_CAPSULE | Freq: Two times a day (BID) | ORAL | 0 refills | Status: DC

## 2016-12-28 MED ORDER — HYDROCODONE-HOMATROPINE 5-1.5 MG/5ML PO SYRP: 5.0000 mL | ORAL_SOLUTION | Freq: Three times a day (TID) | ORAL | 0 refills | Status: DC | PRN

## 2016-12-28 NOTE — Progress Notes (Signed)
Subjective:    Patient ID: Kristina Ayala, female    DOB: Jul 21, 1945, 71 y.o.   MRN: [id number]  HPI She is here for an acute visit for cold symptoms.   Her symptoms started one week ago.   She is experiencing productive cough with brown phlegm, nasal congestion, PND, headaches, chills, sinus pain and wheeze.    She denies fever, ear pain, sore throat, SOB    She has tried taking mucinex, prescription cough syrup.   Increased stress:  Her daughter and granddaughter moved in with her in October. She has lost a lot of weight due to increased stress - about 9 lbs.  She did see Dr Ronnald Ramp.  Her stress level has improved a little.  She does not have the symptoms those had when she saw Dr Ronnald Ramp.     Medications and allergies reviewed with patient and updated if appropriate.  Patient Active Problem List   Diagnosis Date Noted  . Degenerative arthritis of left knee 12/01/2016  . Pilonidal cyst 12/01/2016  . Adjustment disorder with depressed mood 12/01/2016  . Weight loss, non-intentional 12/01/2016  . Elevated amylase 12/01/2016  . Prediabetes 09/16/2016  . Sleep difficulties 09/16/2016  . Diaphoresis 09/16/2016  . Bursitis of right shoulder 09/04/2015  . Lateral meniscus derangement 06/03/2015  . Tear of LCL (lateral collateral ligament) of knee 06/03/2015  . Lumbar radiculopathy 09/19/2014  . Low back pain 01/04/2014  . Bilateral knee pain 01/04/2014  . Diverticulosis of colon without hemorrhage 07/26/2013  . Trigger thumb of left hand 03/06/2013  . Essential hypertension, benign 02/15/2013  . Hyperlipidemia 09/28/2012  . COPD (chronic obstructive pulmonary disease) ([place]) 05/22/2012  . Frequent falls 05/22/2012  . Osteoporosis 05/22/2012    Current Outpatient Medications on File Prior to Visit  Medication Sig Dispense Refill  . acetaminophen (TYLENOL) 500 MG tablet Take 1,000 mg by mouth every 6 (six) hours as needed (For burn pain.).    [name] ALPRAZolam (XANAX) 0.5 MG  tablet TAKE 1 TABLET BY MOUTH EVERY NIGHT AT BEDTIME AS NEEDED FOR ANXIETY 30 tablet 0  . amLODipine (NORVASC) 5 MG tablet TAKE 1 TABLET(5 MG) BY MOUTH DAILY 90 tablet 2  . atorvastatin (LIPITOR) 40 MG tablet TAKE 1 TABLET(40 MG) BY MOUTH EVERY MORNING 90 tablet 1  . calcium-vitamin D (OSCAL WITH D) 500-200 MG-UNIT per tablet Take 1 tablet by mouth daily with breakfast.     . cholecalciferol (VITAMIN D) 1000 UNITS tablet Take 1,000 Units by mouth daily.    . Cyanocobalamin (VITAMIN B-12) 2500 MCG SUBL Place 1 tablet under the tongue daily.    . ferrous sulfate 325 (65 FE) MG tablet Take 1 tablet (325 mg total) by mouth daily with breakfast. 30 tablet 3  . fluticasone (FLONASE) 50 MCG/ACT nasal spray Place 2 sprays into both nostrils daily. 16 g 0  . gabapentin (NEURONTIN) 300 MG capsule Take 1 capsule (300 mg total) by mouth at bedtime. 30 capsule 3  . hydrochlorothiazide (HYDRODIURIL) 25 MG tablet TAKE 1 TABLET(25 MG) BY MOUTH EVERY MORNING 90 tablet 2  . ibuprofen (ADVIL,MOTRIN) 200 MG tablet Take 200-400 mg by mouth every 6 (six) hours as needed for moderate pain.    . Ibuprofen-Famotidine 800-26.6 MG TABS TAKE 1 TABLET DAILY AS NEEDED. 270 tablet 3  . lisinopril (PRINIVIL,ZESTRIL) 40 MG tablet TAKE 1 TABLET(40 MG) BY MOUTH EVERY MORNING 90 tablet 2  . Magnesium 100 MG CAPS Take 100 mg by mouth daily.    [name]  meloxicam (MOBIC) 15 MG tablet Take 1 tablet (15 mg total) by mouth daily as needed for pain. 10 tablet 0  . mirtazapine (REMERON) 7.5 MG tablet Take 1 tablet (7.5 mg total) at bedtime by mouth. 90 tablet 0  . potassium chloride (KLOR-CON) 8 MEQ tablet Take 2 tablets (16 mEq total) daily by mouth. 60 tablet 3  . tiZANidine (ZANAFLEX) 4 MG tablet Take 1 tablet (4 mg total) by mouth Nightly. 30 tablet 2  . TURMERIC PO Take 1 tablet by mouth daily.    . Vitamin D, Ergocalciferol, (DRISDOL) 50000 units CAPS capsule Take 1 capsule (50,000 Units total) every 7 (seven) days by mouth. 12 capsule 0    No current facility-administered medications on file prior to visit.     Past Medical History:  Diagnosis Date  . Arthritis   . Bursitis    left elbow  . Clostridium difficile infection   . Frequent falls   . HLD (hyperlipidemia)   . Hypertension   . Infectious colitis     Past Surgical History:  Procedure Laterality Date  . COLONOSCOPY    . ORIF PROXIMAL TIBIAL PLATEAU FRACTURE Left 02/19/2012  . POLYPECTOMY    . TONSILLECTOMY      Social History   Socioeconomic History  . Marital status: Divorced    Spouse name: None  . Number of children: 2  . Years of education: None  . Highest education level: None  Social Needs  . Financial resource strain: None  . Food insecurity - worry: None  . Food insecurity - inability: None  . Transportation needs - medical: None  . Transportation needs - non-medical: None  Occupational History  . Occupation: RETIRED  Tobacco Use  . Smoking status: Former Smoker    Packs/day: 0.25    Years: 50.00    Pack years: 12.50    Types: Cigarettes    Last attempt to quit: 08/28/2014    Years since quitting: 2.3  . Smokeless tobacco: Never Used  . Tobacco comment: 1 pack every 3 days  Substance and Sexual Activity  . Alcohol use: No    Alcohol/week: 0.0 oz  . Drug use: No  . Sexual activity: None  Other Topics Concern  . None  Social History Narrative   Grandson (31 y/o) lives with patient.     Son lives in [place]   Daughter lives in [place]   She is originally from [place], [place]   Divorced             Family History  Problem Relation Age of Onset  . Diverticulosis Mother   . Ovarian cancer Mother   . Pancreatic cancer Father   . Lung cancer Father   . Colon polyps Sister   . Colon cancer Neg Hx     Review of Systems  Constitutional: Positive for chills. Negative for fever.  HENT: Positive for congestion, postnasal drip and sinus pain. Negative for ear pain and sore throat.   Respiratory: Positive for cough.  Negative for shortness of breath and wheezing.   Gastrointestinal: Negative for diarrhea and nausea.  Musculoskeletal: Negative for myalgias.  Neurological: Positive for headaches.       Objective:   Vitals:   12/28/16 1542  BP: (!) 142/80  Pulse: (!) 110  Resp: 18  Temp: 99 F (37.2 C)  SpO2: 99%   Wt Readings from Last 3 Encounters:  12/28/16 157 lb (71.2 kg)  12/01/16 160 lb (72.6 kg)  12/01/16 161 lb (73  kg)   Body mass index is 26.13 kg/m.   Physical Exam    GENERAL APPEARANCE: Appears stated age, well appearing, NAD EYES: conjunctiva clear, no icterus HEENT: bilateral tympanic membranes and ear canals normal, oropharynx with mild erythema, no thyromegaly, trachea midline, no cervical or supraclavicular lymphadenopathy LUNGS: Clear to auscultation without wheeze or crackles, unlabored breathing, good air entry bilaterally CARDIOVASCULAR: Normal S1,S2 without murmurs, no edema SKIN: Warm, dry     Assessment & Plan:    See Problem List for Assessment and Plan of chronic medical problems.

## 2016-12-28 NOTE — Assessment & Plan Note (Signed)
Likely bacterial Start omnicef  Hycodan cough syrup prn otc cold meds as needed Rest, fluids Call if no improvement

## 2016-12-28 NOTE — Patient Instructions (Signed)
An antibiotic and cough syrup was prescribed.    Your prescription(s) have been submitted to your pharmacy or been printed and provided for you. Please take as directed and contact our office if you believe you are having problem(s) with the medication(s) or have any questions.  If your symptoms worsen or fail to improve, please contact our office for further instruction, or in case of emergency go directly to the emergency room at the closest medical facility.   General Recommendations:    Please drink plenty of fluids.  Get plenty of rest   Sleep in humidified air  Use saline nasal sprays  Netti pot  OTC Medications:  Decongestants - helps relieve congestion   Flonase (generic fluticasone) or Nasacort (generic triamcinolone) - please make sure to use the "cross-over" technique at a 45 degree angle towards the opposite eye as opposed to straight up the nasal passageway.   Sudafed (generic pseudoephedrine - Note this is the one that is available behind the pharmacy counter); Products with phenylephrine (-PE) may also be used but is often not as effective as pseudoephedrine.   If you have HIGH BLOOD PRESSURE - Coricidin HBP; AVOID any product that is -D as this contains pseudoephedrine which may increase your blood pressure.  Afrin (oxymetazoline) every 6-8 hours for up to 3 days.  Allergies - helps relieve runny nose, itchy eyes and sneezing   Claritin (generic loratidine), Allegra (fexofenidine), or Zyrtec (generic cyrterizine) for runny nose. These medications should not cause drowsiness.  Note - Benadryl (generic diphenhydramine) may be used however may cause drowsiness  Cough -   Delsym or Robitussin (generic dextromethorphan)  Expectorants - helps loosen mucus to ease removal   Mucinex (generic guaifenesin) as directed on the package.  Headaches / General Aches   Tylenol (generic acetaminophen) - DO NOT EXCEED 3 grams (3,000 mg) in a 24 hour  time period  Advil/Motrin (generic ibuprofen)  Sore Throat -   Salt water gargle   Chloraseptic (generic benzocaine) spray or lozenges / Sucrets (generic dyclonine)

## 2016-12-31 ENCOUNTER — Telehealth: Payer: Self-pay

## 2016-12-31 ENCOUNTER — Ambulatory Visit (INDEPENDENT_AMBULATORY_CARE_PROVIDER_SITE_OTHER): Payer: Medicare Other

## 2016-12-31 DIAGNOSIS — M810 Age-related osteoporosis without current pathological fracture: Secondary | ICD-10-CM | POA: Diagnosis not present

## 2016-12-31 MED ORDER — DENOSUMAB 60 MG/ML ~~LOC~~ SOLN
60.0000 mg | Freq: Once | SUBCUTANEOUS | Status: AC
  Administered 2016-12-31: 60 mg via SUBCUTANEOUS

## 2016-12-31 NOTE — Telephone Encounter (Signed)
Pt came in today for prolia injection. Pt stated that she was charged $8.00 for the injection(medication) and was suppose to get it for free. Who or whom do we need to contact for patient to get her reimbursed.

## 2016-12-31 NOTE — Progress Notes (Signed)
prolia Injection given.   Cyanna Neace J Dontravious Camille, MD  

## 2017-01-05 ENCOUNTER — Telehealth: Payer: Self-pay | Admitting: Internal Medicine

## 2017-01-05 NOTE — Telephone Encounter (Signed)
Copied from [place]. Topic: Quick Communication - See Telephone Encounter >> Jan 05, 2017 11:55 AM Synthia Innocent wrote: CRM for notification. See Telephone encounter for:  Patient is not feeling any better, still congested, terrible headache. Please advise. Meds are not working 01/05/17.

## 2017-01-05 NOTE — Telephone Encounter (Signed)
Copied from [place]. Topic: Quick Communication - See Telephone Encounter >> Jan 05, 2017 11:55 AM Synthia Innocent wrote: CRM for notification. See Telephone encounter for:  Patient is not feeling any better, still congested, terrible headache. Please advise. Meds are not working. States she can't come in for appt due to weather 01/05/17.

## 2017-01-06 ENCOUNTER — Encounter (HOSPITAL_COMMUNITY): Payer: Self-pay | Admitting: Emergency Medicine

## 2017-01-06 ENCOUNTER — Emergency Department (HOSPITAL_COMMUNITY)
Admission: EM | Admit: 2017-01-06 | Discharge: 2017-01-06 | Disposition: A | Discharge status: Home / Self Care | Payer: Medicare Other | Attending: Emergency Medicine | Admitting: Emergency Medicine

## 2017-01-06 ENCOUNTER — Emergency Department (HOSPITAL_COMMUNITY): Payer: Medicare Other

## 2017-01-06 ENCOUNTER — Other Ambulatory Visit: Payer: Self-pay

## 2017-01-06 DIAGNOSIS — Z79899 Other long term (current) drug therapy: Secondary | ICD-10-CM | POA: Insufficient documentation

## 2017-01-06 DIAGNOSIS — I10 Essential (primary) hypertension: Secondary | ICD-10-CM | POA: Diagnosis not present

## 2017-01-06 DIAGNOSIS — R0981 Nasal congestion: Secondary | ICD-10-CM | POA: Diagnosis not present

## 2017-01-06 DIAGNOSIS — Z87891 Personal history of nicotine dependence: Secondary | ICD-10-CM | POA: Diagnosis not present

## 2017-01-06 DIAGNOSIS — J181 Lobar pneumonia, unspecified organism: Secondary | ICD-10-CM | POA: Diagnosis not present

## 2017-01-06 DIAGNOSIS — J449 Chronic obstructive pulmonary disease, unspecified: Secondary | ICD-10-CM | POA: Insufficient documentation

## 2017-01-06 DIAGNOSIS — E785 Hyperlipidemia, unspecified: Secondary | ICD-10-CM | POA: Insufficient documentation

## 2017-01-06 DIAGNOSIS — Z791 Long term (current) use of non-steroidal anti-inflammatories (NSAID): Secondary | ICD-10-CM | POA: Diagnosis not present

## 2017-01-06 DIAGNOSIS — R5383 Other fatigue: Secondary | ICD-10-CM | POA: Insufficient documentation

## 2017-01-06 DIAGNOSIS — R634 Abnormal weight loss: Secondary | ICD-10-CM | POA: Diagnosis not present

## 2017-01-06 DIAGNOSIS — R05 Cough: Secondary | ICD-10-CM | POA: Diagnosis not present

## 2017-01-06 DIAGNOSIS — K5730 Diverticulosis of large intestine without perforation or abscess without bleeding: Secondary | ICD-10-CM | POA: Diagnosis not present

## 2017-01-06 DIAGNOSIS — J189 Pneumonia, unspecified organism: Secondary | ICD-10-CM

## 2017-01-06 LAB — CBC WITH DIFFERENTIAL/PLATELET
Basophils Absolute: 0 10*3/uL (ref 0.0–0.1)
Basophils Relative: 0 %
EOS ABS: 0.3 10*3/uL (ref 0.0–0.7)
Eosinophils Relative: 4 %
HEMATOCRIT: 41.2 % (ref 36.0–46.0)
HEMOGLOBIN: 13.7 g/dL (ref 12.0–15.0)
LYMPHS ABS: 2.6 10*3/uL (ref 0.7–4.0)
LYMPHS PCT: 29 %
MCH: 27.7 pg (ref 26.0–34.0)
MCHC: 33.3 g/dL (ref 30.0–36.0)
MCV: 83.4 fL (ref 78.0–100.0)
MONOS PCT: 7 %
Monocytes Absolute: 0.6 10*3/uL (ref 0.1–1.0)
Neutro Abs: 5.3 10*3/uL (ref 1.7–7.7)
Neutrophils Relative %: 60 %
Platelets: 336 10*3/uL (ref 150–400)
RBC: 4.94 MIL/uL (ref 3.87–5.11)
RDW: 15 % (ref 11.5–15.5)
WBC: 8.9 10*3/uL (ref 4.0–10.5)

## 2017-01-06 LAB — COMPREHENSIVE METABOLIC PANEL
ALK PHOS: 60 U/L (ref 38–126)
ALT: 27 U/L (ref 14–54)
ANION GAP: 10 (ref 5–15)
AST: 23 U/L (ref 15–41)
Albumin: 4.1 g/dL (ref 3.5–5.0)
BILIRUBIN TOTAL: 0.6 mg/dL (ref 0.3–1.2)
BUN: 14 mg/dL (ref 6–20)
CALCIUM: 9.6 mg/dL (ref 8.9–10.3)
CO2: 29 mmol/L (ref 22–32)
CREATININE: 0.79 mg/dL (ref 0.44–1.00)
Chloride: 102 mmol/L (ref 101–111)
GFR calc non Af Amer: >60 mL/min (ref >60)
Glucose, Bld: 113 mg/dL — ABNORMAL HIGH (ref 65–99)
Potassium: 3.2 mmol/L — ABNORMAL LOW (ref 3.5–5.1)
SODIUM: 141 mmol/L (ref 135–145)
TOTAL PROTEIN: 8.1 g/dL (ref 6.5–8.1)

## 2017-01-06 LAB — LIPASE, BLOOD: Lipase: 27 U/L (ref 11–51)

## 2017-01-06 MED ORDER — IOPAMIDOL (ISOVUE-300) INJECTION 61%: 100.0000 mL | Freq: Once | INTRAVENOUS | Status: DC | PRN

## 2017-01-06 MED ORDER — DEXTROSE 5 % IV SOLN
1.0000 g | Freq: Once | INTRAVENOUS | Status: AC
  Administered 2017-01-06: 1 g via INTRAVENOUS
  Filled 2017-01-06: qty 10

## 2017-01-06 MED ORDER — PREDNISONE 10 MG PO TABS: 10.0000 mg | ORAL_TABLET | Freq: Every day | ORAL | 0 refills | Status: DC

## 2017-01-06 MED ORDER — IOPAMIDOL (ISOVUE-300) INJECTION 61%
100.0000 mL | Freq: Once | INTRAVENOUS | Status: AC | PRN
  Administered 2017-01-06: 100 mL via INTRAVENOUS

## 2017-01-06 MED ORDER — AZITHROMYCIN 250 MG PO TABS: 250.0000 mg | ORAL_TABLET | Freq: Every day | ORAL | 0 refills | Status: DC

## 2017-01-06 MED ORDER — AZITHROMYCIN 250 MG PO TABS
500.0000 mg | ORAL_TABLET | Freq: Once | ORAL | Status: AC
  Administered 2017-01-06: 500 mg via ORAL
  Filled 2017-01-06: qty 2

## 2017-01-06 MED ORDER — ALBUTEROL SULFATE HFA 108 (90 BASE) MCG/ACT IN AERS: 1.0000 | INHALATION_SPRAY | Freq: Four times a day (QID) | RESPIRATORY_TRACT | 0 refills | Status: DC | PRN

## 2017-01-06 MED ORDER — AZITHROMYCIN 500 MG IV SOLR
500.0000 mg | Freq: Once | INTRAVENOUS | Status: AC
  Administered 2017-01-06: 500 mg via INTRAVENOUS
  Filled 2017-01-06: qty 500

## 2017-01-06 MED ORDER — IOPAMIDOL (ISOVUE-300) INJECTION 61%
INTRAVENOUS | Status: AC
  Administered 2017-01-06: 100 mL via INTRAVENOUS
  Filled 2017-01-06: qty 100

## 2017-01-06 MED ORDER — SODIUM CHLORIDE 0.9 % IV BOLUS (SEPSIS)
1000.0000 mL | Freq: Once | INTRAVENOUS | Status: AC
  Administered 2017-01-06: 1000 mL via INTRAVENOUS

## 2017-01-06 MED ORDER — IPRATROPIUM-ALBUTEROL 0.5-2.5 (3) MG/3ML IN SOLN
3.0000 mL | Freq: Once | RESPIRATORY_TRACT | Status: AC
  Administered 2017-01-06: 3 mL via RESPIRATORY_TRACT
  Filled 2017-01-06: qty 3

## 2017-01-06 MED ORDER — ONDANSETRON HCL 4 MG/2ML IJ SOLN
4.0000 mg | Freq: Once | INTRAMUSCULAR | Status: AC
  Administered 2017-01-06: 4 mg via INTRAVENOUS
  Filled 2017-01-06: qty 2

## 2017-01-06 NOTE — ED Triage Notes (Signed)
Patient reports cough that is productive, nasal congestion, body aches, chills since last week. Her PCP put her on cough medications and other meds last week but stil not helping.

## 2017-01-06 NOTE — Discharge Instructions (Signed)
Tests show a pneumonia in your right lung.  Increase fluids.  Prescription for antibiotic and prednisone and inhaler.

## 2017-01-06 NOTE — ED Provider Notes (Signed)
[facility] DEPT Provider Note   CSN: [id number] Arrival date & time: 01/06/17  0847     History   Chief Complaint Chief Complaint  Patient presents with  . Cough  . Nasal Congestion  . Generalized Body Aches    HPI Kristina Ayala is a 71 y.o. female.  Productive cough, nasal drainage for several days.  She was seen by her primary care doctor who placed her on an unknown antibiotic and cough syrup.  Her cough is worsened.  Additionally she had an elevated lipase recently.  Review of systems positive for poor appetite, generalized malaise, weight loss of 12 pounds in 2 months.  She is able to move around the house, but has very poor energy.  Severity of symptoms is moderate.      Past Medical History:  Diagnosis Date  . Arthritis   . Bursitis    left elbow  . Clostridium difficile infection   . Frequent falls   . HLD (hyperlipidemia)   . Hypertension   . Infectious colitis     Patient Active Problem List   Diagnosis Date Noted  . Acute bronchitis 12/28/2016  . Degenerative arthritis of left knee 12/01/2016  . Pilonidal cyst 12/01/2016  . Adjustment disorder with depressed mood 12/01/2016  . Weight loss, non-intentional 12/01/2016  . Elevated amylase 12/01/2016  . Prediabetes 09/16/2016  . Sleep difficulties 09/16/2016  . Diaphoresis 09/16/2016  . Bursitis of right shoulder 09/04/2015  . Lateral meniscus derangement 06/03/2015  . Tear of LCL (lateral collateral ligament) of knee 06/03/2015  . Lumbar radiculopathy 09/19/2014  . Low back pain 01/04/2014  . Bilateral knee pain 01/04/2014  . Diverticulosis of colon without hemorrhage 07/26/2013  . Trigger thumb of left hand 03/06/2013  . Essential hypertension, benign 02/15/2013  . Hyperlipidemia 09/28/2012  . COPD (chronic obstructive pulmonary disease) ([place]) 05/22/2012  . Frequent falls 05/22/2012  . Osteoporosis 05/22/2012    Past Surgical History:  Procedure  Laterality Date  . COLONOSCOPY    . ORIF PROXIMAL TIBIAL PLATEAU FRACTURE Left 02/19/2012  . POLYPECTOMY    . TONSILLECTOMY      OB History    No data available       Home Medications    Prior to Admission medications   Medication Sig Start Date End Date Taking? Authorizing Provider  acetaminophen (TYLENOL) 500 MG tablet Take 1,000 mg by mouth every 6 (six) hours as needed (For burn pain.).   Yes [provider]  ALPRAZolam (XANAX) 0.5 MG tablet TAKE 1 TABLET BY MOUTH EVERY NIGHT AT BEDTIME AS NEEDED FOR ANXIETY 11/29/16  Yes Burns, Claudina Lick, MD  amLODipine (NORVASC) 5 MG tablet TAKE 1 TABLET(5 MG) BY MOUTH DAILY 05/17/16  Yes Burns, Claudina Lick, MD  atorvastatin (LIPITOR) 40 MG tablet TAKE 1 TABLET(40 MG) BY MOUTH EVERY MORNING 06/01/16  Yes Burns, Claudina Lick, MD  cefdinir (OMNICEF) 300 MG capsule Take 1 capsule (300 mg total) by mouth 2 (two) times daily. 12/28/16  Yes Burns, Claudina Lick, MD  Cyanocobalamin (VITAMIN B-12) 2500 MCG SUBL Place 1 tablet under the tongue daily.   Yes [provider]  ferrous sulfate 325 (65 FE) MG tablet Take 1 tablet (325 mg total) by mouth daily with breakfast. 03/29/14  Yes Lyndal Pulley, DO  gabapentin (NEURONTIN) 300 MG capsule Take 1 capsule (300 mg total) by mouth at bedtime. 07/25/16  Yes Davonna Belling, MD  hydrochlorothiazide (HYDRODIURIL) 25 MG tablet TAKE 1 TABLET(25 MG) BY MOUTH  EVERY MORNING 05/05/16  Yes Burns, Claudina Lick, MD  HYDROcodone-homatropine [facility]) 5-1.5 MG/5ML syrup Take 5 mLs by mouth every 8 (eight) hours as needed for cough. 12/28/16  Yes Burns, Claudina Lick, MD  lisinopril (PRINIVIL,ZESTRIL) 40 MG tablet TAKE 1 TABLET(40 MG) BY MOUTH EVERY MORNING 06/28/16  Yes Burns, Claudina Lick, MD  Magnesium 100 MG CAPS Take 100 mg by mouth daily.   Yes [provider]  potassium chloride (KLOR-CON) 8 MEQ tablet Take 2 tablets (16 mEq total) daily by mouth. 12/03/16  Yes Janith Lima, MD  PROLIA 60 MG/ML SOLN injection Inject 60 mg as  directed every 6 (six) months. 12/28/16  Yes [provider]  TURMERIC PO Take 1 tablet by mouth daily.   Yes [provider]  Vitamin D, Ergocalciferol, (DRISDOL) 50000 units CAPS capsule Take 1 capsule (50,000 Units total) every 7 (seven) days by mouth. 12/03/16  Yes Janith Lima, MD  albuterol (PROVENTIL HFA;VENTOLIN HFA) 108 (90 Base) MCG/ACT inhaler Inhale 1-2 puffs into the lungs every 6 (six) hours as needed for wheezing or shortness of breath. 01/06/17   Nat Christen, MD  azithromycin (ZITHROMAX) 250 MG tablet Take 1 tablet (250 mg total) by mouth daily. Take first 2 tablets together, then 1 every day until finished. 01/06/17   Nat Christen, MD  fluticasone [place]) 50 MCG/ACT nasal spray Place 2 sprays into both nostrils daily. Patient not taking: Reported on 01/06/2017 10/25/16   Hoyt Koch, MD  ibuprofen (ADVIL,MOTRIN) 200 MG tablet Take 200-400 mg by mouth every 6 (six) hours as needed for moderate pain.    [provider]  Ibuprofen-Famotidine 800-26.6 MG TABS TAKE 1 TABLET DAILY AS NEEDED. Patient not taking: Reported on 01/06/2017 12/09/14   Lyndal Pulley, DO  meloxicam (MOBIC) 15 MG tablet Take 1 tablet (15 mg total) by mouth daily as needed for pain. Patient not taking: Reported on 01/06/2017 07/25/16   Davonna Belling, MD  mirtazapine (REMERON) 7.5 MG tablet Take 1 tablet (7.5 mg total) at bedtime by mouth. Patient not taking: Reported on 01/06/2017 12/01/16   Janith Lima, MD  predniSONE (DELTASONE) 10 MG tablet Take 1 tablet (10 mg total) by mouth daily with breakfast. 3 tablets for 3 days, 2 tablets for 3 days, 1 tablet for 3 days 01/06/17   Nat Christen, MD  tiZANidine (ZANAFLEX) 4 MG tablet Take 1 tablet (4 mg total) by mouth Nightly. Patient not taking: Reported on 01/06/2017 08/05/16   Lyndal Pulley, DO    Family History Family History  Problem Relation Age of Onset  . Diverticulosis Mother   . Ovarian cancer Mother   .  Pancreatic cancer Father   . Lung cancer Father   . Colon polyps Sister   . Colon cancer Neg Hx     Social History Social History   Tobacco Use  . Smoking status: Former Smoker    Packs/day: 0.25    Years: 50.00    Pack years: 12.50    Types: Cigarettes    Last attempt to quit: 08/28/2014    Years since quitting: 2.3  . Smokeless tobacco: Never Used  . Tobacco comment: 1 pack every 3 days  Substance Use Topics  . Alcohol use: No    Alcohol/week: 0.0 oz  . Drug use: No     Allergies   Doxycycline; Minocycline; Wellbutrin [bupropion]; and Penicillins   Review of Systems Review of Systems  All other systems reviewed and are negative.  Physical Exam Updated Vital Signs BP (!) 141/79   Pulse 93   Temp 98.6 F (37 C) (Oral)   Resp 16   Ht 5\' 5"  (1.651 m)   Wt 71.2 kg (157 lb)   SpO2 100%   BMI 26.13 kg/m   Physical Exam  Constitutional: She is oriented to person, place, and time.  Coughing, slightly dehydrated  HENT:  Head: Normocephalic and atraumatic.  Eyes: Conjunctivae are normal.  Neck: Neck supple.  Cardiovascular: Normal rate and regular rhythm.  Pulmonary/Chest: Effort normal and breath sounds normal.  Abdominal: Soft. Bowel sounds are normal.  Musculoskeletal: Normal range of motion.  Neurological: She is alert and oriented to person, place, and time.  Skin: Skin is warm and dry.  Psychiatric: She has a normal mood and affect. Her behavior is normal.  Nursing note and vitals reviewed.    ED Treatments / Results  Labs (all labs ordered are listed, but only abnormal results are displayed) Labs Reviewed  COMPREHENSIVE METABOLIC PANEL - Abnormal; Notable for the following components:      Result Value   Potassium 3.2 (*)    Glucose, Bld 113 (*)    All other components within normal limits  CBC WITH DIFFERENTIAL/PLATELET  LIPASE, BLOOD    EKG  EKG Interpretation None       Radiology Dg Chest 2 View  Result Date:  01/06/2017 CLINICAL DATA:  Cough and congestion for 1 week EXAM: CHEST  2 VIEW COMPARISON:  11/11/2011 FINDINGS: Cardiac shadow is stable. Mild aortic calcifications are again noted and stable. The lungs are well aerated bilaterally. Early infiltrate is noted in the right middle lobe anteriorly with mild volume loss. No sizable effusion is seen. No bony abnormality is noted. IMPRESSION: Early right middle lobe infiltrate. Electronically Signed   By: Inez Catalina M.D.   On: 01/06/2017 09:26   Ct Abdomen Pelvis W Contrast  Result Date: 01/06/2017 CLINICAL DATA:  Weight loss and elevated lipase EXAM: CT ABDOMEN AND PELVIS WITH CONTRAST TECHNIQUE: Multidetector CT imaging of the abdomen and pelvis was performed using the standard protocol following bolus administration of intravenous contrast. CONTRAST:  100 mL Isovue 300 COMPARISON:  None. FINDINGS: Lower chest: Lung bases demonstrate some mucous plugging in the right lower lobe medially with associated infiltrate. Scattered nodular changes are seen in the right middle and right lower lobes as well as the left lower lobe. The nodules are stable in appearance from previous exam dating back to 2015 consistent with a benign etiology. Hepatobiliary: No focal liver abnormality is seen. No gallstones, gallbladder wall thickening, or biliary dilatation. Pancreas: Unremarkable. No pancreatic ductal dilatation or surrounding inflammatory changes. Spleen: Normal in size without focal abnormality. Adrenals/Urinary Tract: Adrenal glands are within normal limits. Kidneys are well visualized bilaterally within normal enhancement pattern. Bladder is partially distended. No obstructive changes are seen. Stomach/Bowel: Diverticular change of the colon is seen without evidence of diverticulitis. The appendix is within normal limits. No obstructive or inflammatory changes are identified. Vascular/Lymphatic: Scattered atherosclerotic calcifications are noted. No lymphadenopathy is  seen. Reproductive: Uterus is within normal limits. No left adnexal mass is seen. A prominent 3.7 cm cyst is noted within the right ovary. Other: No abdominal wall hernia or abnormality. No abdominopelvic ascites. Musculoskeletal: Mild degenerative change of the lumbar spine is noted. IMPRESSION: Mild right lower lobe infiltrate secondary to some mucous plugging. Stable benign-appearing nodules for several years in both lung bases. Diverticulosis without diverticulitis. Prominent right ovarian cyst of uncertain significance. Electronically  Signed   By: Inez Catalina M.D.   On: 01/06/2017 14:21    Procedures Procedures (including critical care time)  Medications Ordered in ED Medications  iopamidol (ISOVUE-300) 61 % injection 100 mL (not administered)  cefTRIAXone (ROCEPHIN) 1 g in dextrose 5 % 50 mL IVPB (1 g Intravenous New Bag/Given 01/06/17 1537)  azithromycin (ZITHROMAX) 500 mg in dextrose 5 % 250 mL IVPB (not administered)  sodium chloride 0.9 % bolus 1,000 mL (0 mLs Intravenous Stopped 01/06/17 1537)  ondansetron (ZOFRAN) injection 4 mg (4 mg Intravenous Given 01/06/17 1253)  iopamidol (ISOVUE-300) 61 % injection 100 mL (100 mLs Intravenous Contrast Given 01/06/17 1357)  ipratropium-albuterol (DUONEB) 0.5-2.5 (3) MG/3ML nebulizer solution 3 mL (3 mLs Nebulization Given 01/06/17 1537)     Initial Impression / Assessment and Plan / ED Course  I have reviewed the triage vital signs and the nursing notes.  Pertinent labs & imaging results that were available during my care of the patient were reviewed by me and considered in my medical decision making (see chart for details).     Patient presents with cough, weight loss, generalized malaise.  Lipase on 12/01/16 was 122.  White count normal today.  Lipase has normalized.  Chest x-ray and CT of abdomen/pelvis reveal a right middle lobe pneumonia.  Rx IV fluids, IV is Zithromax, IV Rocephin, DuoNeb breathing treatment.  Discharge medications  Zithromax, albuterol inhaler, prednisone.  Tests were discussed with the patient in great detail.  Final Clinical Impressions(s) / ED Diagnoses   Final diagnoses:  Community acquired pneumonia of right lower lobe of lung [facility])    ED Discharge Orders        Ordered    azithromycin (ZITHROMAX) 250 MG tablet  Daily     01/06/17 1518    predniSONE (DELTASONE) 10 MG tablet  Daily with breakfast     01/06/17 1518    albuterol (PROVENTIL HFA;VENTOLIN HFA) 108 (90 Base) MCG/ACT inhaler  Every 6 hours PRN     01/06/17 1519       Nat Christen, MD 01/06/17 1552

## 2017-01-06 NOTE — ED Notes (Signed)
Patient IV stopped due to painful burning during Azithromycin administration.

## 2017-01-10 ENCOUNTER — Telehealth: Payer: Self-pay | Admitting: Internal Medicine

## 2017-01-10 DIAGNOSIS — J189 Pneumonia, unspecified organism: Secondary | ICD-10-CM | POA: Insufficient documentation

## 2017-01-10 DIAGNOSIS — J181 Lobar pneumonia, unspecified organism: Secondary | ICD-10-CM

## 2017-01-10 NOTE — Telephone Encounter (Signed)
Called pt. Back - left message that if she needs further assistance before tomorrow's appointment to call back.

## 2017-01-10 NOTE — Progress Notes (Signed)
Subjective:    Patient ID: Kristina Ayala, female    DOB: 1945-12-13, 71 y.o.   MRN: [id number]  HPI The patient is here for follow up from the ED.  Went to ED 01/06/17 for cough, nasal congestion and body aches.  For several days she had a productive cough, nasal drainage.  I had seen her 12/4 for bronchitis and started her on omnicef.   Her cough got worse so she went to the ED.  She also stated decreased appetite, fatigue, weight loss of 12 lbs in 2 months, which was related to stress of having her daughter move in with her.   Chest xray and Ct abd/pelvix showed a RML pneumonia.  WBC normal.  Lipase normal.  Received IVF, IV Zithromax, IV Rocephin, Duoneb.  She was discharged on Zithromax, albuterol and prednisone.  She has completed the zpak.  She has a couple of days left of the prednisone.    She still has significant nasal congestion.  She still has decreased appetite, fatigue.  She has productive cough, wheeze and SOB and is not sure if these symptoms are better.  Her right posterior lung still hurts.  She denies ear pain, sore throat, sinus pain, nausea, headaches and lightheadedness.   She is unsure why she is not feeling better.  The albuterol is not working well.  She is unsure what to take now.    Medications and allergies reviewed with patient and updated if appropriate.  Patient Active Problem List   Diagnosis Date Noted  . RML pneumonia ([place]) 01/10/2017  . Acute bronchitis 12/28/2016  . Degenerative arthritis of left knee 12/01/2016  . Pilonidal cyst 12/01/2016  . Adjustment disorder with depressed mood 12/01/2016  . Weight loss, non-intentional 12/01/2016  . Elevated amylase 12/01/2016  . Prediabetes 09/16/2016  . Sleep difficulties 09/16/2016  . Diaphoresis 09/16/2016  . Bursitis of right shoulder 09/04/2015  . Lateral meniscus derangement 06/03/2015  . Tear of LCL (lateral collateral ligament) of knee 06/03/2015  . Lumbar radiculopathy 09/19/2014  . Low  back pain 01/04/2014  . Bilateral knee pain 01/04/2014  . Diverticulosis of colon without hemorrhage 07/26/2013  . Trigger thumb of left hand 03/06/2013  . Essential hypertension, benign 02/15/2013  . Hyperlipidemia 09/28/2012  . COPD (chronic obstructive pulmonary disease) ([place]) 05/22/2012  . Frequent falls 05/22/2012  . Osteoporosis 05/22/2012    Current Outpatient Medications on File Prior to Visit  Medication Sig Dispense Refill  . acetaminophen (TYLENOL) 500 MG tablet Take 1,000 mg by mouth every 6 (six) hours as needed (For burn pain.).    [name] albuterol (PROVENTIL HFA;VENTOLIN HFA) 108 (90 Base) MCG/ACT inhaler Inhale 1-2 puffs into the lungs every 6 (six) hours as needed for wheezing or shortness of breath. 1 Inhaler 0  . ALPRAZolam (XANAX) 0.5 MG tablet TAKE 1 TABLET BY MOUTH EVERY NIGHT AT BEDTIME AS NEEDED FOR ANXIETY 30 tablet 0  . amLODipine (NORVASC) 5 MG tablet TAKE 1 TABLET(5 MG) BY MOUTH DAILY 90 tablet 2  . atorvastatin (LIPITOR) 40 MG tablet TAKE 1 TABLET(40 MG) BY MOUTH EVERY MORNING 90 tablet 1  . Cyanocobalamin (VITAMIN B-12) 2500 MCG SUBL Place 1 tablet under the tongue daily.    . ferrous sulfate 325 (65 FE) MG tablet Take 1 tablet (325 mg total) by mouth daily with breakfast. 30 tablet 3  . fluticasone (FLONASE) 50 MCG/ACT nasal spray Place 2 sprays into both nostrils daily. 16 g 0  . gabapentin (NEURONTIN) 300 MG capsule  Take 1 capsule (300 mg total) by mouth at bedtime. 30 capsule 3  . hydrochlorothiazide (HYDRODIURIL) 25 MG tablet TAKE 1 TABLET(25 MG) BY MOUTH EVERY MORNING 90 tablet 2  . HYDROcodone-homatropine (HYCODAN) 5-1.5 MG/5ML syrup Take 5 mLs by mouth every 8 (eight) hours as needed for cough. 75 mL 0  . ibuprofen (ADVIL,MOTRIN) 200 MG tablet Take 200-400 mg by mouth every 6 (six) hours as needed for moderate pain.    . Ibuprofen-Famotidine 800-26.6 MG TABS TAKE 1 TABLET DAILY AS NEEDED. 270 tablet 3  . lisinopril (PRINIVIL,ZESTRIL) 40 MG tablet TAKE 1  TABLET(40 MG) BY MOUTH EVERY MORNING 90 tablet 2  . Magnesium 100 MG CAPS Take 100 mg by mouth daily.    . meloxicam (MOBIC) 15 MG tablet Take 1 tablet (15 mg total) by mouth daily as needed for pain. 10 tablet 0  . mirtazapine (REMERON) 7.5 MG tablet Take 1 tablet (7.5 mg total) at bedtime by mouth. 90 tablet 0  . potassium chloride (KLOR-CON) 8 MEQ tablet Take 2 tablets (16 mEq total) daily by mouth. 60 tablet 3  . predniSONE (DELTASONE) 10 MG tablet Take 1 tablet (10 mg total) by mouth daily with breakfast. 3 tablets for 3 days, 2 tablets for 3 days, 1 tablet for 3 days 18 tablet 0  . PROLIA 60 MG/ML SOLN injection Inject 60 mg as directed every 6 (six) months.  0  . tiZANidine (ZANAFLEX) 4 MG tablet Take 1 tablet (4 mg total) by mouth Nightly. 30 tablet 2  . TURMERIC PO Take 1 tablet by mouth daily.    . Vitamin D, Ergocalciferol, (DRISDOL) 50000 units CAPS capsule Take 1 capsule (50,000 Units total) every 7 (seven) days by mouth. 12 capsule 0   No current facility-administered medications on file prior to visit.     Past Medical History:  Diagnosis Date  . Arthritis   . Bursitis    left elbow  . Clostridium difficile infection   . Frequent falls   . HLD (hyperlipidemia)   . Hypertension   . Infectious colitis     Past Surgical History:  Procedure Laterality Date  . COLONOSCOPY    . ORIF PROXIMAL TIBIAL PLATEAU FRACTURE Left 02/19/2012  . POLYPECTOMY    . TONSILLECTOMY      Social History   Socioeconomic History  . Marital status: Divorced    Spouse name: None  . Number of children: 2  . Years of education: None  . Highest education level: None  Social Needs  . Financial resource strain: None  . Food insecurity - worry: None  . Food insecurity - inability: None  . Transportation needs - medical: None  . Transportation needs - non-medical: None  Occupational History  . Occupation: RETIRED  Tobacco Use  . Smoking status: Former Smoker    Packs/day: 0.25    Years:  50.00    Pack years: 12.50    Types: Cigarettes    Last attempt to quit: 08/28/2014    Years since quitting: 2.3  . Smokeless tobacco: Never Used  . Tobacco comment: 1 pack every 3 days  Substance and Sexual Activity  . Alcohol use: No    Alcohol/week: 0.0 oz  . Drug use: No  . Sexual activity: None  Other Topics Concern  . None  Social History Narrative   Grandson (92 y/o) lives with patient.     Son lives in [place]   Daughter lives in [place]   She is originally from [place], [place]  Divorced             Family History  Problem Relation Age of Onset  . Diverticulosis Mother   . Ovarian cancer Mother   . Pancreatic cancer Father   . Lung cancer Father   . Colon polyps Sister   . Colon cancer Neg Hx     Review of Systems  Constitutional: Positive for appetite change (decreased) and fatigue. Negative for chills and fever.  HENT: Positive for congestion. Negative for ear pain, sinus pain and sore throat.   Respiratory: Positive for cough (productive - sputum tint of brown), shortness of breath and wheezing.        Right upper back discomfort  Cardiovascular: Negative for chest pain and palpitations.  Gastrointestinal: Negative for abdominal pain, diarrhea and nausea.  Neurological: Negative for light-headedness and headaches.       Objective:   Vitals:   01/11/17 1310  BP: 124/88  Pulse: 90  Resp: 16  Temp: 99.1 F (37.3 C)  SpO2: 98%   Wt Readings from Last 3 Encounters:  01/11/17 159 lb (72.1 kg)  01/06/17 157 lb (71.2 kg)  12/28/16 157 lb (71.2 kg)   Body mass index is 26.46 kg/m.   Physical Exam    Constitutional: Appears well-developed and well-nourished. No distress.  HENT:  Head: Normocephalic and atraumatic.  Neck: Neck supple. No tracheal deviation present. No thyromegaly present.  No cervical lymphadenopathy Cardiovascular: Normal rate, regular rhythm and normal heart sounds.   No murmur heard. No carotid bruit .  No  edema Pulmonary/Chest: Effort normal. No respiratory distress. No has no wheezes. Right sided crackles.   Skin: Skin is warm and dry. Not diaphoretic.  Psychiatric: Normal mood and affect. Behavior is normal.   CT Abdomen Pelvis W Contrast CLINICAL DATA:  Weight loss and elevated lipase  EXAM: CT ABDOMEN AND PELVIS WITH CONTRAST  TECHNIQUE: Multidetector CT imaging of the abdomen and pelvis was performed using the standard protocol following bolus administration of intravenous contrast.  CONTRAST:  100 mL Isovue 300  COMPARISON:  None.  FINDINGS: Lower chest: Lung bases demonstrate some mucous plugging in the right lower lobe medially with associated infiltrate. Scattered nodular changes are seen in the right middle and right lower lobes as well as the left lower lobe. The nodules are stable in appearance from previous exam dating back to 2015 consistent with a benign etiology.  Hepatobiliary: No focal liver abnormality is seen. No gallstones, gallbladder wall thickening, or biliary dilatation.  Pancreas: Unremarkable. No pancreatic ductal dilatation or surrounding inflammatory changes.  Spleen: Normal in size without focal abnormality.  Adrenals/Urinary Tract: Adrenal glands are within normal limits. Kidneys are well visualized bilaterally within normal enhancement pattern. Bladder is partially distended. No obstructive changes are seen.  Stomach/Bowel: Diverticular change of the colon is seen without evidence of diverticulitis. The appendix is within normal limits. No obstructive or inflammatory changes are identified.  Vascular/Lymphatic: Scattered atherosclerotic calcifications are noted. No lymphadenopathy is seen.  Reproductive: Uterus is within normal limits. No left adnexal mass is seen. A prominent 3.7 cm cyst is noted within the right ovary.  Other: No abdominal wall hernia or abnormality. No abdominopelvic ascites.  Musculoskeletal: Mild degenerative  change of the lumbar spine is noted.  IMPRESSION: Mild right lower lobe infiltrate secondary to some mucous plugging.  Stable benign-appearing nodules for several years in both lung bases.  Diverticulosis without diverticulitis.  Prominent right ovarian cyst of uncertain significance.  Electronically Signed   By: Elta Guadeloupe  Lukens M.D.   On: 01/06/2017 14:21 DG Chest 2 View CLINICAL DATA:  Cough and congestion for 1 week  EXAM: CHEST  2 VIEW  COMPARISON:  11/11/2011  FINDINGS: Cardiac shadow is stable. Mild aortic calcifications are again noted and stable. The lungs are well aerated bilaterally. Early infiltrate is noted in the right middle lobe anteriorly with mild volume loss. No sizable effusion is seen. No bony abnormality is noted.  IMPRESSION: Early right middle lobe infiltrate.  Electronically Signed   By: Inez Catalina M.D.   On: 01/06/2017 09:26    Assessment & Plan:    See Problem List for Assessment and Plan of chronic medical problems.

## 2017-01-10 NOTE — Telephone Encounter (Signed)
Copied from [place] [id number]. Topic: Quick Communication - See Telephone Encounter >> Jan 10, 2017  9:21 AM Cleaster Corin, NT wrote: CRM for notification. See Telephone encounter for:   12/17/18pt would like to speak with nurse.Patient is not feeling any better, still congested, terrible headache. Please advise. Meds are not working pt. Did go to ed on 12-13 made follow up appt. For tomorrow with Dr. Quay Burow 12-18 pt can be reached at [phone] ok to leave a vm.

## 2017-01-11 ENCOUNTER — Other Ambulatory Visit: Payer: Self-pay | Admitting: Internal Medicine

## 2017-01-11 ENCOUNTER — Other Ambulatory Visit: Payer: Self-pay | Admitting: Emergency Medicine

## 2017-01-11 ENCOUNTER — Ambulatory Visit (INDEPENDENT_AMBULATORY_CARE_PROVIDER_SITE_OTHER)
Admission: RE | Admit: 2017-01-11 | Discharge: 2017-01-11 | Disposition: A | Discharge status: Home / Self Care | Payer: Medicare Other | Source: Ambulatory Visit | Attending: Internal Medicine | Admitting: Internal Medicine

## 2017-01-11 ENCOUNTER — Ambulatory Visit (INDEPENDENT_AMBULATORY_CARE_PROVIDER_SITE_OTHER): Payer: Medicare Other | Admitting: Internal Medicine

## 2017-01-11 ENCOUNTER — Encounter: Payer: Self-pay | Admitting: Internal Medicine

## 2017-01-11 DIAGNOSIS — J181 Lobar pneumonia, unspecified organism: Secondary | ICD-10-CM

## 2017-01-11 DIAGNOSIS — R0981 Nasal congestion: Secondary | ICD-10-CM

## 2017-01-11 DIAGNOSIS — J189 Pneumonia, unspecified organism: Secondary | ICD-10-CM

## 2017-01-11 MED ORDER — ALPRAZOLAM 0.5 MG PO TABS: 0.5000 mg | ORAL_TABLET | Freq: Every evening | ORAL | 0 refills | Status: DC | PRN

## 2017-01-11 MED ORDER — METHYLPREDNISOLONE ACETATE 80 MG/ML IJ SUSP
80.0000 mg | Freq: Once | INTRAMUSCULAR | Status: AC
  Administered 2017-01-11: 80 mg via INTRAMUSCULAR

## 2017-01-11 MED ORDER — IPRATROPIUM-ALBUTEROL 0.5-2.5 (3) MG/3ML IN SOLN
3.0000 mL | Freq: Once | RESPIRATORY_TRACT | Status: AC
  Administered 2017-01-11: 3 mL via RESPIRATORY_TRACT

## 2017-01-11 NOTE — Assessment & Plan Note (Signed)
She is concerned that her symptoms are not improving since she left the emergency room, but I think there probably has been some subtle improvement.  Reassured her that recovering from pneumonia is a long process and it is not like bronchitis or atypical cold Given the persistent pleuritic pain and questionable improving symptoms I will repeat a chest x-ray today to make sure it has not worsened She is still experiencing wheezing so we will give a Depo-Medrol 80 mg intramuscular x1 today and a nebulizer treatment.  She does not feel that the albuterol is working well After chest x-ray and will determine if further antibiotics are needed, but most likely they are not Continue increased rest and fluids as well as over-the-counter cold medications for symptom relief Advised her to call later this week with an update

## 2017-01-11 NOTE — Patient Instructions (Addendum)
You had a steroid injection here today and a nebulizer breathing treatment.   Finish the steroids at home.  Take mucinex, zyrtec and use the flonase.  Increase your fluids.   Have a chest xray today.  We will call you later today with the results and determine if further treatment is needed.      Community-Acquired Pneumonia, Adult Pneumonia is an infection of the lungs. There are different types of pneumonia. One type can develop while a person is in a hospital. A different type, called community-acquired pneumonia, develops in people who are not, or have not recently been, in the hospital or other health care facility. What are the causes? Pneumonia may be caused by bacteria, viruses, or funguses. Community-acquired pneumonia is often caused by Streptococcus pneumonia bacteria. These bacteria are often passed from one person to another by breathing in droplets from the cough or sneeze of an infected person. What increases the risk? The condition is more likely to develop in:  People who havechronic diseases, such as chronic obstructive pulmonary disease (COPD), asthma, congestive heart failure, cystic fibrosis, diabetes, or kidney disease.  People who haveearly-stage or late-stage HIV.  People who havesickle cell disease.  People who havehad their spleen removed (splenectomy).  People who havepoor Human resources officer.  People who havemedical conditions that increase the risk of breathing in (aspirating) secretions their own mouth and nose.  People who havea weakened immune system (immunocompromised).  People who smoke.  People whotravel to areas where pneumonia-causing germs commonly exist.  People whoare around animal habitats or animals that have pneumonia-causing germs, including birds, bats, rabbits, cats, and farm animals.  What are the signs or symptoms? Symptoms of this condition include:  Adry cough.  A wet (productive) cough.  Fever.  Sweating.  Chest  pain, especially when breathing deeply or coughing.  Rapid breathing or difficulty breathing.  Shortness of breath.  Shaking chills.  Fatigue.  Muscle aches.  How is this diagnosed? Your health care provider will take a medical history and perform a physical exam. You may also have other tests, including:  Imaging studies of your chest, including X-rays.  Tests to check your blood oxygen level and other blood gases.  Other tests on blood, mucus (sputum), fluid around your lungs (pleural fluid), and urine.  If your pneumonia is severe, other tests may be done to identify the specific cause of your illness. How is this treated? The type of treatment that you receive depends on many factors, such as the cause of your pneumonia, the medicines you take, and other medical conditions that you have. For most adults, treatment and recovery from pneumonia may occur at home. In some cases, treatment must happen in a hospital. Treatment may include:  Antibiotic medicines, if the pneumonia was caused by bacteria.  Antiviral medicines, if the pneumonia was caused by a virus.  Medicines that are given by mouth or through an IV tube.  Oxygen.  Respiratory therapy.  Although rare, treating severe pneumonia may include:  Mechanical ventilation. This is done if you are not breathing well on your own and you cannot maintain a safe blood oxygen level.  Thoracentesis. This procedureremoves fluid around one lung or both lungs to help you breathe better.  Follow these instructions at home:  Take over-the-counter and prescription medicines only as told by your health care provider. ? Only takecough medicine if you are losing sleep. Understand that cough medicine can prevent your body's natural ability to remove mucus from your lungs. ?  If you were prescribed an antibiotic medicine, take it as told by your health care provider. Do not stop taking the antibiotic even if you start to feel  better.  Sleep in a semi-upright position at night. Try sleeping in a reclining chair, or place a few pillows under your head.  Do not use tobacco products, including cigarettes, chewing tobacco, and e-cigarettes. If you need help quitting, ask your health care provider.  Drink enough water to keep your urine clear or pale yellow. This will help to thin out mucus secretions in your lungs. How is this prevented? There are ways that you can decrease your risk of developing community-acquired pneumonia. Consider getting a pneumococcal vaccine if:  You are older than 72 years of age.  You are older than 72 years of age and are undergoing cancer treatment, have chronic lung disease, or have other medical conditions that affect your immune system. Ask your health care provider if this applies to you.  There are different types and schedules of pneumococcal vaccines. Ask your health care provider which vaccination option is best for you. You may also prevent community-acquired pneumonia if you take these actions:  Get an influenza vaccine every year. Ask your health care provider which type of influenza vaccine is best for you.  Go to the dentist on a regular basis.  Wash your hands often. Use hand sanitizer if soap and water are not available.  Contact a health care provider if:  You have a fever.  You are losing sleep because you cannot control your cough with cough medicine. Get help right away if:  You have worsening shortness of breath.  You have increased chest pain.  Your sickness becomes worse, especially if you are an older adult or have a weakened immune system.  You cough up blood. This information is not intended to replace advice given to you by your health care provider. Make sure you discuss any questions you have with your health care provider. Document Released: 01/11/2005 Document Revised: 05/22/2015 Document Reviewed: 05/08/2014 Elsevier Interactive Patient  Education  2017 [employer].

## 2017-01-11 NOTE — Assessment & Plan Note (Signed)
She is still experiencing significant nasal congestion She feels Flonase is helping and will continue Restart Mucinex Can start Zyrtec or Claritin Increase fluids

## 2017-01-11 NOTE — Telephone Encounter (Signed)
[place] Controlled Substance Database checked. Last filled on 11/30/16

## 2017-01-13 ENCOUNTER — Telehealth: Payer: Self-pay | Admitting: Internal Medicine

## 2017-01-13 NOTE — Telephone Encounter (Signed)
Copied from [place]. Topic: Quick Communication - Office Called Patient >> Jan 13, 2017 12:25 PM Melene Plan, CMA wrote: Reason for CRM: LVM for pt to call back to advise what dates she needed to be written out of work. Pt can be transferred to office to speak to assistant.  >> Jan 13, 2017  2:18 PM Yvette Rack wrote: Patient states that letter should saw after the follow up treatmentthat was given to the patient pt should be able to return back to work 2-3 days  After treatment

## 2017-01-13 NOTE — Telephone Encounter (Signed)
Ok to give her as much as time as she needs

## 2017-01-13 NOTE — Telephone Encounter (Addendum)
Patient states when she was last in she needed a return to work note for 2-4 days.   Can Fax this for to [employer] at [phone]. Please call patient once faxed.

## 2017-01-13 NOTE — Telephone Encounter (Signed)
LVM for pt to call back and advise what dates she needs to be written out of work.

## 2017-01-13 NOTE — Telephone Encounter (Signed)
When should pt return to work? I can do the letter

## 2017-01-13 NOTE — Telephone Encounter (Signed)
Spoke with pt, she would like to have the letter say she can return to work 2-4 days after being seen on Tues 18th.

## 2017-01-14 ENCOUNTER — Encounter: Payer: Self-pay | Admitting: Emergency Medicine

## 2017-01-14 NOTE — Telephone Encounter (Signed)
Letter faxed to number given below

## 2017-01-20 ENCOUNTER — Ambulatory Visit: Payer: Self-pay | Admitting: *Deleted

## 2017-01-20 ENCOUNTER — Telehealth: Payer: Self-pay | Admitting: Family Medicine

## 2017-01-20 NOTE — Telephone Encounter (Signed)
Pt reports no relief from cough/congestion since 12/28/16. Dx with pneumonia 12/4. F/U with Dr. Quay Burow 12/18, had steroid injection. Has been using Claritin, Mucinex 2 x daily, ineffective. Productive cough for thin, tan secretions, also reports post nasal drip.Is not able to check temperature but has "chills. " Nose runny", mild right sided chest/rib "muscular type" discomfort. Reports "flutter" in chest with coughing x 2 days, happens intermittently. Denies CP, any dizziness with palpations. Also reports feeling anxious. PCP appt unavailable; appt made with Kristina Ayala for tomorrow at 1000. Advised to use humidifier,rest,warm liquids and to call back if symptoms worsen or fever occurs, increased palpations, any dizziness with palpations.   Reason for Disposition . [1] Continuous (nonstop) coughing interferes with work or school AND [2] no improvement using cough treatment per Care Advice  Answer Assessment - Initial Assessment Questions 1. ONSET: "When did the cough begin?"      12/28/16 2. SEVERITY: "How bad is the cough today?"      Severe 3. RESPIRATORY DISTRESS: "Describe your breathing."      With coughing 4. FEVER: "Do you have a fever?" If so, ask: "What is your temperature, how was it measured, and when did it start?"     Unsure, has chills. 5. SPUTUM: "Describe the color of your sputum" (clear, white, yellow, green)     Light tan, thin. 6. HEMOPTYSIS: "Are you coughing up any blood?" If so ask: "How much?" (flecks, streaks, tablespoons, etc.)     no 7. CARDIAC HISTORY: "Do you have any history of heart disease?" (e.g., heart attack, congestive heart failure)      no 8. LUNG HISTORY: "Do you have any history of lung disease?"  (e.g., pulmonary embolus, asthma, emphysema)     no 9. PE RISK FACTORS: "Do you have a history of blood clots?" (or: recent major surgery, recent prolonged travel, bedridden )     no 10. OTHER SYMPTOMS: "Do you have any other symptoms?" (e.g., runny nose, wheezing,  chest pain)       Runny nose, "head congestion"  12. TRAVEL: "Have you traveled out of the country in the last month?" (e.g., travel history, exposures)       no  Protocols used: [place]

## 2017-01-20 NOTE — Telephone Encounter (Signed)
Per dr Tamala Julian, take OTC vitamin D 2000IU qd.

## 2017-01-20 NOTE — Telephone Encounter (Signed)
Copied from [place]. Topic: General - Other >> Jan 20, 2017 10:53 AM Cecelia Byars, NT wrote: Reason for CRM: Patient say she is unable to afford to the vitamin D prescribed  please call her to  discuss

## 2017-01-21 ENCOUNTER — Encounter: Payer: Self-pay | Admitting: Urgent Care

## 2017-01-21 ENCOUNTER — Ambulatory Visit (INDEPENDENT_AMBULATORY_CARE_PROVIDER_SITE_OTHER): Payer: Medicare Other | Admitting: Urgent Care

## 2017-01-21 ENCOUNTER — Other Ambulatory Visit: Payer: Self-pay

## 2017-01-21 VITALS — BP 148/88 | HR 99 | Temp 98.2°F | Resp 16 | Ht 65.0 in | Wt 159.6 lb

## 2017-01-21 DIAGNOSIS — R0982 Postnasal drip: Secondary | ICD-10-CM

## 2017-01-21 DIAGNOSIS — R0981 Nasal congestion: Secondary | ICD-10-CM | POA: Diagnosis not present

## 2017-01-21 DIAGNOSIS — R059 Cough, unspecified: Secondary | ICD-10-CM

## 2017-01-21 DIAGNOSIS — R0789 Other chest pain: Secondary | ICD-10-CM | POA: Diagnosis not present

## 2017-01-21 DIAGNOSIS — R05 Cough: Secondary | ICD-10-CM

## 2017-01-21 DIAGNOSIS — J189 Pneumonia, unspecified organism: Secondary | ICD-10-CM

## 2017-01-21 MED ORDER — CETIRIZINE HCL 10 MG PO TABS: 10.0000 mg | ORAL_TABLET | Freq: Every day | ORAL | 11 refills | Status: DC

## 2017-01-21 MED ORDER — PSEUDOEPHEDRINE HCL 30 MG PO TABS: 30.0000 mg | ORAL_TABLET | Freq: Three times a day (TID) | ORAL | 0 refills | Status: DC | PRN

## 2017-01-21 MED ORDER — ACETAMINOPHEN-CODEINE 120-12 MG/5ML PO SOLN: 5.0000 mL | Freq: Every evening | ORAL | 0 refills | Status: DC | PRN

## 2017-01-21 MED ORDER — BENZONATATE 100 MG PO CAPS: 100.0000 mg | ORAL_CAPSULE | Freq: Three times a day (TID) | ORAL | 0 refills | Status: DC | PRN

## 2017-01-21 NOTE — Patient Instructions (Addendum)
For sore throat try using a honey-based tea. Use 3 teaspoons of honey with juice squeezed from half lemon. Place shaved pieces of ginger into 1/2-1 cup of water and warm over stove top. Then mix the ingredients and repeat every 4 hours as needed.     Community-Acquired Pneumonia, Adult Pneumonia is an infection of the lungs. There are different types of pneumonia. One type can develop while a person is in a hospital. A different type, called community-acquired pneumonia, develops in people who are not, or have not recently been, in the hospital or other health care facility. What are the causes? Pneumonia may be caused by bacteria, viruses, or funguses. Community-acquired pneumonia is often caused by Streptococcus pneumonia bacteria. These bacteria are often passed from one person to another by breathing in droplets from the cough or sneeze of an infected person. What increases the risk? The condition is more likely to develop in:  People who havechronic diseases, such as chronic obstructive pulmonary disease (COPD), asthma, congestive heart failure, cystic fibrosis, diabetes, or kidney disease.  People who haveearly-stage or late-stage HIV.  People who havesickle cell disease.  People who havehad their spleen removed (splenectomy).  People who havepoor Human resources officer.  People who havemedical conditions that increase the risk of breathing in (aspirating) secretions their own mouth and nose.  People who havea weakened immune system (immunocompromised).  People who smoke.  People whotravel to areas where pneumonia-causing germs commonly exist.  People whoare around animal habitats or animals that have pneumonia-causing germs, including birds, bats, rabbits, cats, and farm animals.  What are the signs or symptoms? Symptoms of this condition include:  Adry cough.  A wet (productive) cough.  Fever.  Sweating.  Chest pain, especially when breathing deeply or  coughing.  Rapid breathing or difficulty breathing.  Shortness of breath.  Shaking chills.  Fatigue.  Muscle aches.  How is this diagnosed? Your health care provider will take a medical history and perform a physical exam. You may also have other tests, including:  Imaging studies of your chest, including X-rays.  Tests to check your blood oxygen level and other blood gases.  Other tests on blood, mucus (sputum), fluid around your lungs (pleural fluid), and urine.  If your pneumonia is severe, other tests may be done to identify the specific cause of your illness. How is this treated? The type of treatment that you receive depends on many factors, such as the cause of your pneumonia, the medicines you take, and other medical conditions that you have. For most adults, treatment and recovery from pneumonia may occur at home. In some cases, treatment must happen in a hospital. Treatment may include:  Antibiotic medicines, if the pneumonia was caused by bacteria.  Antiviral medicines, if the pneumonia was caused by a virus.  Medicines that are given by mouth or through an IV tube.  Oxygen.  Respiratory therapy.  Although rare, treating severe pneumonia may include:  Mechanical ventilation. This is done if you are not breathing well on your own and you cannot maintain a safe blood oxygen level.  Thoracentesis. This procedureremoves fluid around one lung or both lungs to help you breathe better.  Follow these instructions at home:  Take over-the-counter and prescription medicines only as told by your health care provider. ? Only takecough medicine if you are losing sleep. Understand that cough medicine can prevent your body's natural ability to remove mucus from your lungs. ? If you were prescribed an antibiotic medicine, take it as told  by your health care provider. Do not stop taking the antibiotic even if you start to feel better.  Sleep in a semi-upright position at  night. Try sleeping in a reclining chair, or place a few pillows under your head.  Do not use tobacco products, including cigarettes, chewing tobacco, and e-cigarettes. If you need help quitting, ask your health care provider.  Drink enough water to keep your urine clear or pale yellow. This will help to thin out mucus secretions in your lungs. How is this prevented? There are ways that you can decrease your risk of developing community-acquired pneumonia. Consider getting a pneumococcal vaccine if:  You are older than 72 years of age.  You are older than 72 years of age and are undergoing cancer treatment, have chronic lung disease, or have other medical conditions that affect your immune system. Ask your health care provider if this applies to you.  There are different types and schedules of pneumococcal vaccines. Ask your health care provider which vaccination option is best for you. You may also prevent community-acquired pneumonia if you take these actions:  Get an influenza vaccine every year. Ask your health care provider which type of influenza vaccine is best for you.  Go to the dentist on a regular basis.  Wash your hands often. Use hand sanitizer if soap and water are not available.  Contact a health care provider if:  You have a fever.  You are losing sleep because you cannot control your cough with cough medicine. Get help right away if:  You have worsening shortness of breath.  You have increased chest pain.  Your sickness becomes worse, especially if you are an older adult or have a weakened immune system.  You cough up blood. This information is not intended to replace advice given to you by your health care provider. Make sure you discuss any questions you have with your health care provider. Document Released: 01/11/2005 Document Revised: 05/22/2015 Document Reviewed: 05/08/2014 Elsevier Interactive Patient Education  2018 [employer].     Cough,  Adult Coughing is a reflex that clears your throat and your airways. Coughing helps to heal and protect your lungs. It is normal to cough occasionally, but a cough that happens with other symptoms or lasts a long time may be a sign of a condition that needs treatment. A cough may last only 2-3 weeks (acute), or it may last longer than 8 weeks (chronic). What are the causes? Coughing is commonly caused by:  Breathing in substances that irritate your lungs.  A viral or bacterial respiratory infection.  Allergies.  Asthma.  Postnasal drip.  Smoking.  Acid backing up from the stomach into the esophagus (gastroesophageal reflux).  Certain medicines.  Chronic lung problems, including COPD (or rarely, lung cancer).  Other medical conditions such as heart failure.  Follow these instructions at home: Pay attention to any changes in your symptoms. Take these actions to help with your discomfort:  Take medicines only as told by your health care provider. ? If you were prescribed an antibiotic medicine, take it as told by your health care provider. Do not stop taking the antibiotic even if you start to feel better. ? Talk with your health care provider before you take a cough suppressant medicine.  Drink enough fluid to keep your urine clear or pale yellow.  If the air is dry, use a cold steam vaporizer or humidifier in your bedroom or your home to help loosen secretions.  Avoid anything  that causes you to cough at work or at home.  If your cough is worse at night, try sleeping in a semi-upright position.  Avoid cigarette smoke. If you smoke, quit smoking. If you need help quitting, ask your health care provider.  Avoid caffeine.  Avoid alcohol.  Rest as needed.  Contact a health care provider if:  You have new symptoms.  You cough up pus.  Your cough does not get better after 2-3 weeks, or your cough gets worse.  You cannot control your cough with suppressant medicines  and you are losing sleep.  You develop pain that is getting worse or pain that is not controlled with pain medicines.  You have a fever.  You have unexplained weight loss.  You have night sweats. Get help right away if:  You cough up blood.  You have difficulty breathing.  Your heartbeat is very fast. This information is not intended to replace advice given to you by your health care provider. Make sure you discuss any questions you have with your health care provider. Document Released: 07/10/2010 Document Revised: 06/19/2015 Document Reviewed: 03/20/2014 Elsevier Interactive Patient Education  2018 [employer].    IF you received an x-ray today, you will receive an invoice from [facility]. Please contact [facility] at [phone] with questions or concerns regarding your invoice.   IF you received labwork today, you will receive an invoice from [facility]. Please contact [facility] at [phone] with questions or concerns regarding your invoice.   Our billing staff will not be able to assist you with questions regarding bills from these companies.  You will be contacted with the lab results as soon as they are available. The fastest way to get your results is to activate your My Chart account. Instructions are located on the last page of this paperwork. If you have not heard from [place] regarding the results in 2 weeks, please contact this office.

## 2017-01-21 NOTE — Progress Notes (Signed)
    MRN: [id number] DOB: [birth year]  Subjective:   Kristina Ayala is a 71 y.o. female presenting for persistent productive cough that elicits occasional mild chest pain. She has been seen by her PCP twice on 01/06/2017 and for follow up on 01/11/2017, diagnosed with pneumonia as seen on her chest x-ray. Has undergone course of omnicef, IV ceftriaxone, azithromycin, 1 oral course of steroids and lastly a steroid injection at her last OV with her PCP. Notes improvement in her back and chest discomfort from her cough. However, the cough persists along with nasal congestion, runny nose, drainage in her throat. Denies shob, wheezing, n/v, abdominal pain, sinus pain, ear pain. Denies smoking cigarettes. Has a history of c. Diff infection.  Kristina Ayala has a current medication list which includes the following prescription(s): alprazolam, amlodipine, atorvastatin, vitamin b-12, ferrous sulfate, hydrochlorothiazide, lisinopril, magnesium, potassium chloride, prolia, acetaminophen, albuterol, fluticasone, gabapentin, hydrocodone-homatropine, ibuprofen, ibuprofen-famotidine, meloxicam, mirtazapine, prednisone, tizanidine, turmeric, and vitamin d (ergocalciferol). Also is allergic to doxycycline; minocycline; wellbutrin [bupropion]; and penicillins.  Kristina Ayala  has a past medical history of Arthritis, Bursitis, Clostridium difficile infection, Frequent falls, HLD (hyperlipidemia), Hypertension, and Infectious colitis. Also  has a past surgical history that includes Tonsillectomy; ORIF proximal tibial plateau fracture (Left, 02/19/2012); Polypectomy; and Colonoscopy.  Objective:   Vitals: BP (!) 148/88 (BP Location: Left Arm, Patient Position: Sitting, Cuff Size: Normal)   Pulse 99   Temp 98.2 F (36.8 C) (Oral)   Resp 16   Ht 5\' 5"  (1.651 m)   Wt 159 lb 9.6 oz (72.4 kg)   SpO2 100%   BMI 26.56 kg/m   Physical Exam  Constitutional: She is oriented to person, place, and time. She appears well-developed and  well-nourished.  HENT:  TM's intact bilaterally, no effusions or erythema. Nasal turbinates boggy, edematous and violaceous, nasal passages minimally patent. No sinus tenderness. Oropharynx with significant post-nasal drainage, mucous membranes moist.   Eyes: No scleral icterus.  Neck: Normal range of motion. Neck supple.  Cardiovascular: Normal rate, regular rhythm and intact distal pulses. Exam reveals no gallop and no friction rub.  No murmur heard. Pulmonary/Chest: No respiratory distress. She has no wheezes. She has no rales.  Lymphadenopathy:    She has no cervical adenopathy.  Neurological: She is alert and oriented to person, place, and time.  Skin: Skin is warm and dry.  Psychiatric: She has a normal mood and affect.   Assessment and Plan :   Pneumonia of both lungs due to infectious organism, unspecified part of lung  Cough  Atypical chest pain  Nasal congestion  Post-nasal drainage  Patient has undergone multiple rounds of antibiotics, steroids. She has also had 2 x-rays, last 01/11/2017, of her chest. I counseled patient that holding off on additional antibiotics is most appropriate especially given her history of c. Diff. For now we will manage her symptoms supportively. Stop Mucinex, start Zyrtec with Sudafed (30mg ). Start APAP-codeine and Tessalon for cough suppression. Hydrate well. ER and return-to-clinic precautions discussed, patient verbalized understanding. Otherwise, f/u in 2 weeks for repeat chest x-ray.  Jaynee Eagles, PA-C [facility] [place] [phone] 01/21/2017  10:36 AM

## 2017-02-09 ENCOUNTER — Telehealth: Payer: Self-pay | Admitting: Internal Medicine

## 2017-02-09 DIAGNOSIS — M5416 Radiculopathy, lumbar region: Secondary | ICD-10-CM

## 2017-02-09 NOTE — Telephone Encounter (Signed)
Copied from [place] [id number]. Topic: Quick Communication - See Telephone Encounter >> Feb 09, 2017  9:35 AM Robina Ade, Helene Kelp D wrote: Patient has a question about having an nerve injection. She would like to talk to [facility] or provider Dr. Tamala Julian about this, please call patient back, thanks.  CRM for notification. See Telephone encounter for: 02/09/17.

## 2017-02-09 NOTE — Telephone Encounter (Signed)
Spoke with pt, she is requesting another nerve root injection. Ordered Injection & sent to [place]. Advised pt that once she has scheduled her injection to call our office & schedule an appt with Dr. Tamala Julian 2 weeks after injection. Pt understood.

## 2017-02-20 ENCOUNTER — Other Ambulatory Visit: Payer: Self-pay | Admitting: Internal Medicine

## 2017-02-21 NOTE — Telephone Encounter (Signed)
[place] Controlled Substance Database checked. Last filled on 01/11/17

## 2017-02-22 ENCOUNTER — Ambulatory Visit
Admission: RE | Admit: 2017-02-22 | Discharge: 2017-02-22 | Disposition: A | Discharge status: Home / Self Care | Payer: Medicare Other | Source: Ambulatory Visit | Attending: Family Medicine | Admitting: Family Medicine

## 2017-02-22 DIAGNOSIS — M5416 Radiculopathy, lumbar region: Secondary | ICD-10-CM

## 2017-02-22 DIAGNOSIS — M4727 Other spondylosis with radiculopathy, lumbosacral region: Secondary | ICD-10-CM | POA: Diagnosis not present

## 2017-02-22 MED ORDER — METHYLPREDNISOLONE ACETATE 40 MG/ML INJ SUSP (RADIOLOG
120.0000 mg | Freq: Once | INTRAMUSCULAR | Status: AC
  Administered 2017-02-22: 120 mg via EPIDURAL

## 2017-02-22 MED ORDER — IOPAMIDOL (ISOVUE-M 200) INJECTION 41%
1.0000 mL | Freq: Once | INTRAMUSCULAR | Status: AC
  Administered 2017-02-22: 1 mL via EPIDURAL

## 2017-02-22 NOTE — Discharge Instructions (Signed)

## 2017-02-25 ENCOUNTER — Other Ambulatory Visit: Payer: Medicare Other

## 2017-03-09 ENCOUNTER — Encounter: Payer: Self-pay | Admitting: Family Medicine

## 2017-03-09 ENCOUNTER — Ambulatory Visit: Payer: Medicare Other | Admitting: Family Medicine

## 2017-03-09 VITALS — BP 138/88 | HR 107 | Ht 60.0 in | Wt 154.8 lb

## 2017-03-09 DIAGNOSIS — M5416 Radiculopathy, lumbar region: Secondary | ICD-10-CM | POA: Diagnosis not present

## 2017-03-09 DIAGNOSIS — M1712 Unilateral primary osteoarthritis, left knee: Secondary | ICD-10-CM

## 2017-03-09 DIAGNOSIS — R634 Abnormal weight loss: Secondary | ICD-10-CM | POA: Diagnosis not present

## 2017-03-09 NOTE — Assessment & Plan Note (Addendum)
Patient given an injection.  Tolerated procedure well.  Discussed icing regimen and home exercises.  Discussed which activities to doing which wants to avoid.  Patient could be a candidate for Visco supplementation.  Stability.

## 2017-03-09 NOTE — Patient Instructions (Signed)
Good to see you  We injected the knee again today  I hope it gives some relief.  We will get you in with PT for your back and they will call you  Try to take care of yourself.  See me again in 6 weeks.

## 2017-03-09 NOTE — Assessment & Plan Note (Signed)
Continues to have radicular symptoms.  We discussed possible repeating nerve injection which patient declined.  Does not want any type of surgical intervention.  Will be referred to physical therapy.  Patient will continue home exercises.  Follow-up again in 4-6 weeks

## 2017-03-09 NOTE — Assessment & Plan Note (Signed)
Wt Readings from Last 3 Encounters:  03/09/17 154 lb 12.8 oz (70.2 kg)  01/21/17 159 lb 9.6 oz (72.4 kg)  01/11/17 159 lb (72.1 kg)   Patient's weight is fairly stable but will continue to monitor.  Patient did not want any laboratory workup at this time.

## 2017-03-09 NOTE — Progress Notes (Signed)
[name] [address], [place] [address] Phone: [phone] Subjective:     CC: Low back pain follow-up  ION:[id number]  Kristina Ayala is a 72 y.o. female coming in with complaint of low back pain.  Patient was having worsening lumbar spine pain with radicular symptoms.  MRI of the lumbar spine done August 20, 2016 was independently visualized by me showing patient having compressive left-sided L5 and S1 nerve roots.  Patient has had now 2 selective nerve root injections last one February 22, 2017.  Patient states that she has not had any improvement since her injection. She states that her left knee has been giving out on her, making her off balance. She does use the gabapentin and zanaflex at night. Patient is concerned as she has lost 3 pounds since 01/11/17. She said that she is stressed over the fact that her daughter and granddaughter are going to be displaced in a few weeks as they can no longer stay with her in the retirement home.     Past Medical History:  Diagnosis Date  . Arthritis   . Bursitis    left elbow  . Clostridium difficile infection   . Frequent falls   . HLD (hyperlipidemia)   . Hypertension   . Infectious colitis    Past Surgical History:  Procedure Laterality Date  . COLONOSCOPY    . ORIF PROXIMAL TIBIAL PLATEAU FRACTURE Left 02/19/2012  . POLYPECTOMY    . TONSILLECTOMY     Social History   Socioeconomic History  . Marital status: Divorced    Spouse name: None  . Number of children: 2  . Years of education: None  . Highest education level: None  Social Needs  . Financial resource strain: None  . Food insecurity - worry: None  . Food insecurity - inability: None  . Transportation needs - medical: None  . Transportation needs - non-medical: None  Occupational History  . Occupation: RETIRED  Tobacco Use  . Smoking status: Former Smoker    Packs/day: 0.25    Years: 50.00    Pack years: 12.50    Types:  Cigarettes    Last attempt to quit: 08/28/2014    Years since quitting: 2.5  . Smokeless tobacco: Never Used  . Tobacco comment: 1 pack every 3 days  Substance and Sexual Activity  . Alcohol use: No    Alcohol/week: 0.0 oz  . Drug use: No  . Sexual activity: None  Other Topics Concern  . None  Social History Narrative   Grandson (72 y/o) lives with patient.     Son lives in [place]   Daughter lives in [place]   She is originally from [place], [place]   Divorced            Allergies  Allergen Reactions  . Doxycycline Diarrhea  . Minocycline Diarrhea  . Wellbutrin [Bupropion] Swelling    Per pt her tongue was swollen and sore/symptoms stopped once the medication was discontinued.   [name] Penicillins Rash    Has patient had a PCN reaction causing immediate rash, facial/tongue/throat swelling, SOB or lightheadedness with hypotension: yes Has patient had a PCN reaction causing severe rash involving mucus membranes or skin necrosis: no Has patient had a PCN reaction that required hospitalization: unknown Has patient had a PCN reaction occurring within the last 10 years: no If all of the above answers are "NO", then may proceed with Cephalosporin use.    Family History  Problem Relation Age of Onset  . Diverticulosis Mother   . Ovarian cancer Mother   . Pancreatic cancer Father   . Lung cancer Father   . Colon polyps Sister   . Colon cancer Neg Hx      Past medical history, social, surgical and family history all reviewed in electronic medical record.  No pertanent information unless stated regarding to the chief complaint.   Review of Systems:Review of systems updated and as accurate as of 03/09/17  No headache, visual changes, nausea, vomiting, diarrhea, constipation, dizziness, abdominal pain, skin rash, fevers, chills, night sweats, weight loss, swollen lymph nodes, body aches, joint swelling, muscle aches Positive muscle aches  Objective  Blood pressure 138/88, pulse (!)  107, height 5' (1.524 m), weight 154 lb 12.8 oz (70.2 kg), SpO2 97 %. Systems examined below as of 03/09/17   General: No apparent distress alert and oriented x3 mood and affect normal, dressed appropriately.  HEENT: Pupils equal, extraocular movements intact  Respiratory: Patient's speak in full sentences and does not appear short of breath  Cardiovascular: No lower extremity edema, non tender, no erythema  Skin: Warm dry intact with no signs of infection or rash on extremities or on axial skeleton.  Abdomen: Soft nontender  Neuro: Cranial nerves II through XII are intact, neurovascularly intact in all extremities with 2+ DTRs and 2+ pulses.  Lymph: No lymphadenopathy of posterior or anterior cervical chain or axillae bilaterally.  Gait antalgic gait MSK:  tender with decreased range of motion but good stability and symmetric strength and tone of shoulders, elbows, wrist, hip, and ankles bilaterally.   Knee: Left valgus deformity noted. Large thigh to calf ratio.  Tender to palpation over medial and PF joint line.  ROM full in flexion and extension and lower leg rotation. instability with valgus force.  painful patellar compression. Patellar glide with moderate crepitus. Patellar and quadriceps tendons unremarkable. Hamstring and quadriceps strength is normal. Contralateral knee shows mild arthritic changes but not as much tenderness.  Back exam shows severe amount of pain.  Diffusely.  Positive straight leg on the right.  Patient wanted to discontinue the exam secondary to discomfort.  After informed written and verbal consent, patient was seated on exam table. Left knee was prepped with alcohol swab and utilizing anterolateral approach, patient's left knee space was injected with 4:1  marcaine 0.5%: Kenalog 40mg /dL. Patient tolerated the procedure well without immediate complications.    Impression and Recommendations:     This case required medical decision making of moderate  complexity.      Note: This dictation was prepared with Dragon dictation along with smaller phrase technology. Any transcriptional errors that result from this process are unintentional.

## 2017-03-17 ENCOUNTER — Encounter: Payer: Self-pay | Admitting: Urgent Care

## 2017-03-17 ENCOUNTER — Ambulatory Visit (INDEPENDENT_AMBULATORY_CARE_PROVIDER_SITE_OTHER): Payer: Medicare Other

## 2017-03-17 ENCOUNTER — Other Ambulatory Visit: Payer: Self-pay | Admitting: Urgent Care

## 2017-03-17 ENCOUNTER — Ambulatory Visit (INDEPENDENT_AMBULATORY_CARE_PROVIDER_SITE_OTHER): Payer: Medicare Other | Admitting: Urgent Care

## 2017-03-17 VITALS — BP 124/73 | HR 105 | Temp 99.3°F | Resp 18 | Ht 60.0 in | Wt 155.6 lb

## 2017-03-17 DIAGNOSIS — R05 Cough: Secondary | ICD-10-CM

## 2017-03-17 DIAGNOSIS — M545 Low back pain, unspecified: Secondary | ICD-10-CM

## 2017-03-17 DIAGNOSIS — M25552 Pain in left hip: Secondary | ICD-10-CM | POA: Diagnosis not present

## 2017-03-17 DIAGNOSIS — R0981 Nasal congestion: Secondary | ICD-10-CM | POA: Diagnosis not present

## 2017-03-17 DIAGNOSIS — Z20828 Contact with and (suspected) exposure to other viral communicable diseases: Secondary | ICD-10-CM

## 2017-03-17 DIAGNOSIS — R059 Cough, unspecified: Secondary | ICD-10-CM

## 2017-03-17 DIAGNOSIS — S79912A Unspecified injury of left hip, initial encounter: Secondary | ICD-10-CM | POA: Diagnosis not present

## 2017-03-17 MED ORDER — BENZONATATE 100 MG PO CAPS: 100.0000 mg | ORAL_CAPSULE | Freq: Three times a day (TID) | ORAL | 0 refills | Status: DC | PRN

## 2017-03-17 MED ORDER — PSEUDOEPHEDRINE HCL 60 MG PO TABS: 60.0000 mg | ORAL_TABLET | Freq: Three times a day (TID) | ORAL | 0 refills | Status: DC | PRN
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
## 2017-03-24 ENCOUNTER — Encounter: Payer: Self-pay | Admitting: Physical Therapy

## 2017-03-24 ENCOUNTER — Ambulatory Visit: Payer: Medicare Other | Attending: Family Medicine | Admitting: Physical Therapy

## 2017-03-24 ENCOUNTER — Other Ambulatory Visit: Payer: Self-pay

## 2017-03-24 DIAGNOSIS — M5442 Lumbago with sciatica, left side: Secondary | ICD-10-CM

## 2017-03-24 DIAGNOSIS — M62830 Muscle spasm of back: Secondary | ICD-10-CM | POA: Diagnosis not present

## 2017-03-24 DIAGNOSIS — R296 Repeated falls: Secondary | ICD-10-CM | POA: Diagnosis not present

## 2017-03-24 NOTE — Therapy (Signed)
[place] [place], [place], [address] Phone: [phone]   Fax:  [phone]  Physical Therapy Evaluation  Patient Details  Name: Kristina Ayala MRN: [id number] Date of Birth: 1945/12/11 Referring Provider: Creig Hines   Encounter Date: 03/24/2017  PT End of Session - 03/24/17 1603    Visit Number  1    Date for PT Re-Evaluation  05/24/17    PT Start Time  1601    PT Stop Time  1618    PT Time Calculation (min)  47 min    Activity Tolerance  Patient tolerated treatment well    Behavior During Therapy  [facility] for tasks assessed/performed       Past Medical History:  Diagnosis Date  . Arthritis   . Bursitis    left elbow  . Clostridium difficile infection   . Frequent falls   . HLD (hyperlipidemia)   . Hypertension   . Infectious colitis     Past Surgical History:  Procedure Laterality Date  . COLONOSCOPY    . ORIF PROXIMAL TIBIAL PLATEAU FRACTURE Left 02/19/2012  . POLYPECTOMY    . TONSILLECTOMY      There were no vitals filed for this visit.   Subjective Assessment - 03/24/17 1542    Subjective  Patient reports that she has had left low back pain with some sciatica for about a year.  She reports that she has had at least 4 falls over teh past year.  She reports frustration with the falls.  She does report a lot of stress with renovation of her apartment and with her daughter and granddaughter    Limitations  Walking;House hold activities    Patient Stated Goals  have less pain and no falls    Currently in Pain?  Yes    Pain Score  3     Pain Location  Back    Pain Orientation  Left    Pain Descriptors / Indicators  Aching    Pain Type  Acute pain    Pain Onset  More than a month ago    Pain Frequency  Constant    Aggravating Factors   standing, lifting  and stress pain can be up to 7/10    Pain Relieving Factors  rest pain down to a 3/10    Effect of Pain on Daily Activities  limits  activities, reports that she does a little bit and then goes and sits down         [facility] PT Assessment - 03/24/17 0001      Assessment   Medical Diagnosis  lumbar radiculopathy, repeated falls    Referring Provider  Z. Smith    Onset Date/Surgical Date  02/21/17    Prior Therapy  no      Precautions   Precautions  Fall      Balance Screen   Has the patient fallen in the past 6 months  Yes    How many times?  4    Has the patient had a decrease in activity level because of a fear of falling?   Yes    Is the patient reluctant to leave their home because of a fear of falling?   Yes      Home Environment   Additional Comments  has stairs but has a lift, does some housework      Prior Function   Level of Independence  Independent    Vocation  Retired    Leisure  has a 73 year old living with her      ROM / Strength   AROM / PROM / Strength  AROM;Strength      AROM   Overall AROM Comments  Lumbar ROM was decreased 50% with some back pain      Strength   Overall Strength Comments  4-/5 with mild left hip pain      Standardized Balance Assessment   Standardized Balance Assessment  Timed Up and Go Test;Berg Balance Test      Berg Balance Test   Sit to Stand  Able to stand  independently using hands    Standing Unsupported  Able to stand safely 2 minutes    Sitting with Back Unsupported but Feet Supported on Floor or Stool  Able to sit safely and securely 2 minutes    Stand to Sit  Controls descent by using hands    Transfers  Able to transfer safely, definite need of hands    Standing Unsupported with Eyes Closed  Able to stand 3 seconds    Standing Ubsupported with Feet Together  Needs help to attain position but able to stand for 30 seconds with feet together    From Standing, Reach Forward with Outstretched Arm  Can reach confidently >25 cm (10")    From Standing Position, Pick up Object from Floor  Able to pick up shoe, needs supervision    From Standing Position, Turn  to Look Behind Over each Shoulder  Turn sideways only but maintains balance    Turn 360 Degrees  Able to turn 360 degrees safely one side only in 4 seconds or less    Standing Unsupported, Alternately Place Feet on Step/Stool  Able to complete 4 steps without aid or supervision    Standing Unsupported, One Foot in Front  Able to take small step independently and hold 30 seconds    Standing on One Leg  Tries to lift leg/unable to hold 3 seconds but remains standing independently    Total Score  37      Timed Up and Go Test   Normal TUG (seconds)  19             Objective measurements completed on examination: See above findings.      [facility] PT Treatment/Exercise - 03/24/17 0001      Modalities   Modalities  Electrical Stimulation;Moist Heat      Moist Heat Therapy   Number Minutes Moist Heat  15 Minutes    Moist Heat Location  Lumbar Spine      Electrical Stimulation   Electrical Stimulation Location  left lumbar area    Electrical Stimulation Action  IFC    Electrical Stimulation Parameters  supine    Electrical Stimulation Goals  Pain               PT Short Term Goals - 03/24/17 1606      PT SHORT TERM GOAL #1   Title  be independent in initial HEP    Time  2    Period  Weeks        PT Long Term Goals - 03/24/17 1631      PT LONG TERM GOAL #1   Title  be independent in advanced HEP    Time  8    Period  Weeks    Status  New      PT LONG TERM GOAL #2   Title  decrease TUG to 14 seconds    Time  8    Period  Weeks    Status  New      PT LONG TERM GOAL #3   Title  increase Berg balance test to 46/56    Time  8    Period  Weeks    Status  New      PT LONG TERM GOAL #4   Title  report a 50% reduction in LBP with ADLs and self-care    Time  8    Period  Weeks    Status  New             Plan - 03/24/17 1604    Clinical Impression Statement  Patient reports some left low back pain and left sciatic type pain for about a year.   She is unsure of a cause but has significant spasms in the left lumbar area.  She reports 4 falls in the past year.  Her TUG was 19 seconds, her Berb balance test was 37/56, putting her at high risk for falls    Clinical Presentation  Stable    Clinical Decision Making  Moderate    Rehab Potential  Good    PT Frequency  2x / week    PT Duration  8 weeks    PT Treatment/Interventions  ADLs/Self Care Home Management;Cryotherapy;Electrical Stimulation;Gait training;Neuromuscular re-education;Balance training;Therapeutic exercise;Therapeutic activities;Moist Heat;Stair training;Functional mobility training;Patient/family education;Manual techniques    PT Next Visit Plan  Patient has a high co pay so she will limit her visits    Consulted and Agree with Plan of Care  Patient       Patient will benefit from skilled therapeutic intervention in order to improve the following deficits and impairments:  Abnormal gait, Decreased range of motion, Difficulty walking, Increased muscle spasms, Decreased activity tolerance, Pain, Decreased balance, Impaired flexibility, Improper body mechanics, Decreased mobility, Decreased strength  Visit Diagnosis: Acute left-sided low back pain with left-sided sciatica - Plan: PT plan of care cert/re-cert  Muscle spasm of back - Plan: PT plan of care cert/re-cert  Repeated falls - Plan: PT plan of care cert/re-cert     Problem List Patient Active Problem List   Diagnosis Date Noted  . Nasal congestion 01/11/2017  . RML pneumonia ([place]) 01/10/2017  . Acute bronchitis 12/28/2016  . Degenerative arthritis of left knee 12/01/2016  . Pilonidal cyst 12/01/2016  . Adjustment disorder with depressed mood 12/01/2016  . Weight loss, non-intentional 12/01/2016  . Elevated amylase 12/01/2016  . Prediabetes 09/16/2016  . Sleep difficulties 09/16/2016  . Diaphoresis 09/16/2016  . Bursitis of right shoulder 09/04/2015  . Lateral meniscus derangement 06/03/2015  . Tear of  LCL (lateral collateral ligament) of knee 06/03/2015  . Lumbar radiculopathy 09/19/2014  . Low back pain 01/04/2014  . Bilateral knee pain 01/04/2014  . Diverticulosis of colon without hemorrhage 07/26/2013  . Trigger thumb of left hand 03/06/2013  . Essential hypertension, benign 02/15/2013  . Hyperlipidemia 09/28/2012  . COPD (chronic obstructive pulmonary disease) ([place]) 05/22/2012  . Frequent falls 05/22/2012  . Osteoporosis 05/22/2012    Sumner Boast., PT 03/24/2017, 4:35 PM  [place] [address] [place], [place], [address] Phone: [phone]   Fax:  [phone]  Name: Kristina Ayala MRN: [id number] Date of Birth: [birth year]

## 2017-03-25 ENCOUNTER — Ambulatory Visit (INDEPENDENT_AMBULATORY_CARE_PROVIDER_SITE_OTHER): Payer: Medicare Other | Admitting: Urgent Care

## 2017-03-25 ENCOUNTER — Encounter: Payer: Self-pay | Admitting: Urgent Care

## 2017-03-25 VITALS — BP 144/84 | HR 91 | Temp 98.0°F | Resp 18 | Ht 60.0 in | Wt 155.0 lb

## 2017-03-25 DIAGNOSIS — R05 Cough: Secondary | ICD-10-CM

## 2017-03-25 DIAGNOSIS — R059 Cough, unspecified: Secondary | ICD-10-CM

## 2017-03-25 DIAGNOSIS — M545 Low back pain, unspecified: Secondary | ICD-10-CM
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
## 2017-03-28 ENCOUNTER — Telehealth: Payer: Self-pay | Admitting: Internal Medicine

## 2017-03-28 NOTE — Telephone Encounter (Signed)
Copied from [facility]. Topic: Quick Communication - Rx Refill/Question >> Mar 28, 2017  3:41 PM Scherrie Gerlach wrote: Medication:  meloxicam  Pt states she saw Rosario Adie at [facility]. Pt states when she saw the doctor 03/25/17, he mentioned calling this med in. Pt thought she had this med on hand, but she does not.  Would like the dr to call in please  [place], [place] - [place] [phone] (Phone) [phone] (Fax)  Pt states seh is considering transferring care to [facility]

## 2017-03-29 MED ORDER — MELOXICAM 7.5 MG PO TABS
7.5000 mg | ORAL_TABLET | Freq: Every day | ORAL | 0 refills | Status: DC
Start: 2017-03-29 — End: 2017-04-05

## 2017-03-29 NOTE — Telephone Encounter (Signed)
Refill provided

## 2017-04-01 ENCOUNTER — Ambulatory Visit: Payer: Medicare Other | Admitting: Urgent Care

## 2017-04-05 ENCOUNTER — Ambulatory Visit (INDEPENDENT_AMBULATORY_CARE_PROVIDER_SITE_OTHER): Payer: Medicare Other | Admitting: Urgent Care

## 2017-04-05 ENCOUNTER — Ambulatory Visit (INDEPENDENT_AMBULATORY_CARE_PROVIDER_SITE_OTHER): Payer: Medicare Other

## 2017-04-05 ENCOUNTER — Encounter: Payer: Self-pay | Admitting: Urgent Care

## 2017-04-05 VITALS — BP 122/80 | HR 99 | Temp 97.9°F | Resp 18 | Ht 60.0 in | Wt 156.6 lb

## 2017-04-05 DIAGNOSIS — R0781 Pleurodynia: Secondary | ICD-10-CM

## 2017-04-05 DIAGNOSIS — S20212A Contusion of left front wall of thorax, initial encounter: Secondary | ICD-10-CM | POA: Diagnosis not present

## 2017-04-05 DIAGNOSIS — S20219A Contusion of unspecified front wall of thorax, initial encounter: Secondary | ICD-10-CM | POA: Diagnosis not present

## 2017-04-05 MED ORDER — METHOCARBAMOL 500 MG PO TABS: 500.0000 mg | ORAL_TABLET | Freq: Three times a day (TID) | ORAL | 1 refills | Status: DC | PRN

## 2017-04-05 MED ORDER — MELOXICAM 7.5 MG PO TABS: 7.5000 mg | ORAL_TABLET | Freq: Every day | ORAL | 0 refills | Status: DC

## 2017-04-05 NOTE — Patient Instructions (Addendum)
Chest Contusion, Adult A chest contusion is a deep bruise to the chest. Contusions are usually the result of a blunt injury to tissues under the skin. The injury can damage the small blood vessels under the skin, which causes bleeding under the skin. The skin overlying the contusion may turn blue, purple, or yellow. Minor injuries may give you a painless contusion, but more severe contusions may stay painful and swollen for a few weeks. What are the causes? A contusion is usually caused by a hard hit (blow), trauma, or direct force to your chest, such as:  A motor vehicle accident.  Falls.  Bicycle injuries.  Contact sport injuries.  What increases the risk? You may be at a higher risk for a chest contusion if you play a sport in which falls and contact are common, such as football or soccer. What are the signs or symptoms? Symptoms of this condition include:  Chest swelling.  Pain and tenderness of the chest.  Discomfort with certain movements of the upper torso.  Discoloration of the chest. The area may have redness and then turn blue, purple, or yellow.  Discomfort when taking deep breaths.  How is this diagnosed? A chest contusion is diagnosed from a physical exam and your medical history. An X-ray may be needed to determine if there were any associated injuries, such as broken bones (fractures). Sometimes other tests such as CT scans, ultrasounds, or MRIs may be needed if internal injuries are suspected. A test that shows the amount of oxygen in your blood (pulse oximetry) may be done if you have trouble breathing. How is this treated? Often, the best treatment for a chest contusion is resting and applying ice to the injured area. Deep-breathing exercises may be recommended to reduce the risk of pneumonia. Oxygen therapy may be given if you have trouble breathing or have low oxygen levels. Over-the-counter medicines may also be recommended for pain control. Follow these  instructions at home:  If directed, apply ice to the injured area. ? Put ice in a plastic bag. ? Place a towel between your skin and the bag. ? Leave the ice on for 20 minutes, 2-3 times per day.  Take over-the-counter and prescription medicines only as told by your health care provider.  Do any deep-breathing exercises as told by your health care provider, if this applies.  Do not lie down flat on your back. Keep your head and chest raised (elevated) when you are resting or sleeping.  Do not use any products that contain nicotine or tobacco, such as cigarettes and e-cigarettes. If you need help quitting, ask your health care provider.  Do not lift anything that causes you discomfort or pain. Contact a health care provider if:  Your swelling or pain is not relieved with medicines or treatment.  You have increased bruising or swelling.  You have pain that is getting worse.  Your symptoms have not improved after one week. Get help right away if:  You have a sudden, significant increase in pain.  You have difficulty breathing.  You have dizziness, weakness, or fainting.  You have blood in your urine or stool.  You cough up blood or you vomit blood. Summary  A chest contusion is a deep bruise to the chest that is usually caused by a hard hit, trauma, or direct force to your chest.  Treatment for a chest contusion may include resting and applying ice to the injured area.  Contact a health care provider if you have  problems breathing or if your pain does not improve with treatment. This information is not intended to replace advice given to you by your health care provider. Make sure you discuss any questions you have with your health care provider. Document Released: 10/06/2000 Document Revised: 10/09/2015 Document Reviewed: 10/09/2015 Elsevier Interactive Patient Education  2018 [employer].    Meloxicam tablets What is this medicine? MELOXICAM (mel OX i cam) is a  non-steroidal anti-inflammatory drug (NSAID). It is used to reduce swelling and to treat pain. It may be used for osteoarthritis, rheumatoid arthritis, or juvenile rheumatoid arthritis. This medicine may be used for other purposes; ask your health care provider or pharmacist if you have questions. COMMON BRAND NAME(S): Mobic What should I tell my health care provider before I take this medicine? They need to know if you have any of these conditions: -bleeding disorders -cigarette smoker -coronary artery bypass graft (CABG) surgery within the past 2 weeks -drink more than 3 alcohol-containing drinks per day -heart disease -high blood pressure -history of stomach bleeding -kidney disease -liver disease -lung or breathing disease, like asthma -stomach or intestine problems -an unusual or allergic reaction to meloxicam, aspirin, other NSAIDs, other medicines, foods, dyes, or preservatives -pregnant or trying to get pregnant -breast-feeding How should I use this medicine? Take this medicine by mouth with a full glass of water. Follow the directions on the prescription label. You can take it with or without food. If it upsets your stomach, take it with food. Take your medicine at regular intervals. Do not take it more often than directed. Do not stop taking except on your doctor's advice. A special MedGuide will be given to you by the pharmacist with each prescription and refill. Be sure to read this information carefully each time. Talk to your pediatrician regarding the use of this medicine in children. While this drug may be prescribed for selected conditions, precautions do apply. Patients over 49 years old may have a stronger reaction and need a smaller dose. Overdosage: If you think you have taken too much of this medicine contact a poison control center or emergency room at once. NOTE: This medicine is only for you. Do not share this medicine with others. What if I miss a dose? If you  miss a dose, take it as soon as you can. If it is almost time for your next dose, take only that dose. Do not take double or extra doses. What may interact with this medicine? Do not take this medicine with any of the following medications: -cidofovir -ketorolac This medicine may also interact with the following medications: -aspirin and aspirin-like medicines -certain medicines for blood pressure, heart disease, irregular heart beat -certain medicines for depression, anxiety, or psychotic disturbances -certain medicines that treat or prevent blood clots like warfarin, enoxaparin, dalteparin, apixaban, dabigatran, rivaroxaban -cyclosporine -digoxin -diuretics -methotrexate -other NSAIDs, medicines for pain and inflammation, like ibuprofen and naproxen -pemetrexed This list may not describe all possible interactions. Give your health care provider a list of all the medicines, herbs, non-prescription drugs, or dietary supplements you use. Also tell them if you smoke, drink alcohol, or use illegal drugs. Some items may interact with your medicine. What should I watch for while using this medicine? Tell your doctor or healthcare professional if your symptoms do not start to get better or if they get worse. Do not take other medicines that contain aspirin, ibuprofen, or naproxen with this medicine. Side effects such as stomach upset, nausea, or ulcers may be  more likely to occur. Many medicines available without a prescription should not be taken with this medicine. This medicine can cause ulcers and bleeding in the stomach and intestines at any time during treatment. This can happen with no warning and may cause death. There is increased risk with taking this medicine for a long time. Smoking, drinking alcohol, older age, and poor health can also increase risks. Call your doctor right away if you have stomach pain or blood in your vomit or stool. This medicine does not prevent heart attack or  stroke. In fact, this medicine may increase the chance of a heart attack or stroke. The chance may increase with longer use of this medicine and in people who have heart disease. If you take aspirin to prevent heart attack or stroke, talk with your doctor or health care professional. What side effects may I notice from receiving this medicine? Side effects that you should report to your doctor or health care professional as soon as possible: -allergic reactions like skin rash, itching or hives, swelling of the face, lips, or tongue -nausea, vomiting -signs and symptoms of a blood clot such as breathing problems; changes in vision; chest pain; severe, sudden headache; pain, swelling, warmth in the leg; trouble speaking; sudden numbness or weakness of the face, arm, or leg -signs and symptoms of bleeding such as bloody or black, tarry stools; red or dark-brown urine; spitting up blood or brown material that looks like coffee grounds; red spots on the skin; unusual bruising or bleeding from the eye, gums, or nose -signs and symptoms of liver injury like dark yellow or brown urine; general ill feeling or flu-like symptoms; light-colored stools; loss of appetite; nausea; right upper belly pain; unusually weak or tired; yellowing of the eyes or skin -signs and symptoms of stroke like changes in vision; confusion; trouble speaking or understanding; severe headaches; sudden numbness or weakness of the face, arm, or leg; trouble walking; dizziness; loss of balance or coordination Side effects that usually do not require medical attention (report to your doctor or health care professional if they continue or are bothersome): -constipation -diarrhea -gas This list may not describe all possible side effects. Call your doctor for medical advice about side effects. You may report side effects to FDA at 1-800-FDA-1088. Where should I keep my medicine? Keep out of the reach of children. Store at room temperature  between 15 and 30 degrees C (59 and 86 degrees F). Throw away any unused medicine after the expiration date. NOTE: This sheet is a summary. It may not cover all possible information. If you have questions about this medicine, talk to your doctor, pharmacist, or health care provider.  2018 Elsevier/Gold Standard (2015-02-12 19:28:16)     IF you received an x-ray today, you will receive an invoice from [facility]. Please contact [facility] at [phone] with questions or concerns regarding your invoice.   IF you received labwork today, you will receive an invoice from [facility]. Please contact [facility] at [phone] with questions or concerns regarding your invoice.   Our billing staff will not be able to assist you with questions regarding bills from these companies.  You will be contacted with the lab results as soon as they are available. The fastest way to get your results is to activate your My Chart account. Instructions are located on the last page of this paperwork. If you have not heard from [place] regarding the results in 2 weeks, please contact this office.

## 2017-04-05 NOTE — Progress Notes (Signed)
   MRN: [id number] DOB: [birth year]  Subjective:   Kristina Ayala is a 72 y.o. female presenting for 1 week history of suffering an injury while trying to flip her mattress. The mattress fell onto patient and the corner jabbed into her left lateral chest. Has had intermittent achy, sharp pain since then, has a difficult time lifting her left arm all the way up. She has tried meloxicam. Denies shob, bruising. Denies smoking cigarettes.   [name] has a current medication list which includes the following prescription(s): acetaminophen, acetaminophen-codeine, alprazolam, amlodipine, atorvastatin, cetirizine, vitamin b-12, ferrous sulfate, gabapentin, hydrochlorothiazide, ibuprofen, lisinopril, magnesium, meloxicam, potassium chloride, prolia, pseudoephedrine, tizanidine, and turmeric. Also is allergic to doxycycline; minocycline; wellbutrin [bupropion]; and penicillins.  Kessler  has a past medical history of Arthritis, Bursitis, Clostridium difficile infection, Frequent falls, HLD (hyperlipidemia), Hypertension, and Infectious colitis. Also  has a past surgical history that includes Tonsillectomy; ORIF proximal tibial plateau fracture (Left, 02/19/2012); Polypectomy; and Colonoscopy.  Objective:   Vitals: BP 122/80   Pulse 99   Temp 97.9 F (36.6 C) (Oral)   Resp 18   Ht 5' (1.524 m)   Wt 156 lb 9.6 oz (71 kg)   SpO2 98%   BMI 30.58 kg/m   BP Readings from Last 3 Encounters:  04/05/17 122/80  03/25/17 (!) 144/84  03/17/17 124/73    Physical Exam  Constitutional: She is oriented to person, place, and time. She appears well-developed and well-nourished.  Cardiovascular: Normal rate, regular rhythm and intact distal pulses. Exam reveals no gallop and no friction rub.  No murmur heard. Pulmonary/Chest: No respiratory distress. She has no wheezes. She has no rales. She exhibits tenderness (over area depicted). She exhibits no crepitus, no edema and no swelling.    Neurological: She is  alert and oriented to person, place, and time.   Dg Ribs Unilateral W/chest Left  Result Date: 04/05/2017 CLINICAL DATA:  Chest wall contusion left-sided.  Left chest pain EXAM: LEFT RIBS AND CHEST - 3+ VIEW COMPARISON:  01/11/2017 FINDINGS: Heart size upper normal. Negative for heart failure. Lungs are clear without infiltrate or effusion. No pneumothorax Negative for left rib fracture. IMPRESSION: Negative. Electronically Signed   By: Franchot Gallo M.D.   On: 04/05/2017 14:07    Assessment and Plan :   Rib pain on left side - Plan: DG Ribs Unilateral W/Chest Left  Chest wall contusion, left, initial encounter - Plan: DG Ribs Unilateral W/Chest Left  Will manage conservatively with meloxicam and Robaxin. Radiology report and physical exam findings reassuring. Counseled patient on potential for adverse effects with medications prescribed today, patient verbalized understanding. Return-to-clinic precautions discussed, patient verbalized understanding.   Jaynee Eagles, PA-C [facility] [phone] 04/05/2017  1:27 PM

## 2017-04-07 ENCOUNTER — Ambulatory Visit: Payer: Medicare Other | Attending: Family Medicine | Admitting: Physical Therapy

## 2017-04-07 DIAGNOSIS — R296 Repeated falls: Secondary | ICD-10-CM | POA: Diagnosis not present

## 2017-04-07 DIAGNOSIS — M62830 Muscle spasm of back: Secondary | ICD-10-CM

## 2017-04-07 DIAGNOSIS — M5442 Lumbago with sciatica, left side: Secondary | ICD-10-CM | POA: Diagnosis not present

## 2017-04-07 NOTE — Therapy (Signed)
[place] [address] [place], [place], [address] Phone: [phone]   Fax:  [phone]  Physical Therapy Treatment  Patient Details  Name: Kristina Ayala MRN: [id number] Date of Birth: Jun 18, 1945 Referring Provider: Creig Hines   Encounter Date: 04/07/2017  PT End of Session - 04/07/17 1456    Visit Number  2    Date for PT Re-Evaluation  05/24/17    PT Start Time  1430    PT Stop Time  1525    PT Time Calculation (min)  55 min       Past Medical History:  Diagnosis Date  . Arthritis   . Bursitis    left elbow  . Clostridium difficile infection   . Frequent falls   . HLD (hyperlipidemia)   . Hypertension   . Infectious colitis     Past Surgical History:  Procedure Laterality Date  . COLONOSCOPY    . ORIF PROXIMAL TIBIAL PLATEAU FRACTURE Left 02/19/2012  . POLYPECTOMY    . TONSILLECTOMY      There were no vitals filed for this visit.  Subjective Assessment - 04/07/17 1433    Subjective  tried to flip a king size mattress and corner fell on me- saw MD abd nothing broken, but bruised ribs    Currently in Pain?  Yes    Pain Score  4     Pain Location  Back                      [facility] Adult PT Treatment/Exercise - 04/07/17 0001      Exercises   Exercises  Lumbar;Knee/Hip      Lumbar Exercises: Aerobic   Nustep  L 4 6 min      Lumbar Exercises: Machines for Strengthening   Cybex Lumbar Extension  black band 2 sets 10    Other Lumbar Machine Exercise  seated row 15# 2 sets 10      Lumbar Exercises: Standing   Other Standing Lumbar Exercises  red tband scap stab 12 reps 3 way    Other Standing Lumbar Exercises  red tband hip 3 way 10 reps each      Lumbar Exercises: Seated   Other Seated Lumbar Exercises  pelvic ROM with LE stab on sit fit      Modalities   Modalities  Electrical Stimulation;Moist Heat      Moist Heat Therapy   Number Minutes Moist Heat  15 Minutes     Moist Heat Location  Lumbar Spine      Electrical Stimulation   Electrical Stimulation Location  left lumbar area    Electrical Stimulation Action  IFC    Electrical Stimulation Parameters  supine    Electrical Stimulation Goals  Pain               PT Short Term Goals - 04/07/17 1506      PT SHORT TERM GOAL #1   Title  be independent in initial HEP    Status  Achieved        PT Long Term Goals - 03/24/17 1631      PT LONG TERM GOAL #1   Title  be independent in advanced HEP    Time  8    Period  Weeks    Status  New      PT LONG TERM GOAL #2   Title  decrease TUG to 14 seconds  Time  8    Period  Weeks    Status  New      PT LONG TERM GOAL #3   Title  increase Berg balance test to 46/56    Time  8    Period  Weeks    Status  New      PT LONG TERM GOAL #4   Title  report a 50% reduction in LBP with ADLs and self-care    Time  8    Period  Weeks    Status  New            Plan - 04/07/17 1456    Clinical Impression Statement  postural cuing needed with all ex. LE mvmt on left increased back and rib pain but tolerated fair. issued HEP for scap/LE and back- STG met    PT Treatment/Interventions  ADLs/Self Care Home Management;Cryotherapy;Electrical Stimulation;Gait training;Neuromuscular re-education;Balance training;Therapeutic exercise;Therapeutic activities;Moist Heat;Stair training;Functional mobility training;Patient/family education;Manual techniques    PT Next Visit Plan  Patient has a high co pay so she will limit her visits.  assess pain, check HEP and progress. ADD BALANCE       Patient will benefit from skilled therapeutic intervention in order to improve the following deficits and impairments:  Abnormal gait, Decreased range of motion, Difficulty walking, Increased muscle spasms, Decreased activity tolerance, Pain, Decreased balance, Impaired flexibility, Improper body mechanics, Decreased mobility, Decreased strength  Visit  Diagnosis: Acute left-sided low back pain with left-sided sciatica  Repeated falls  Muscle spasm of back     Problem List Patient Active Problem List   Diagnosis Date Noted  . Nasal congestion 01/11/2017  . RML pneumonia ([place]) 01/10/2017  . Acute bronchitis 12/28/2016  . Degenerative arthritis of left knee 12/01/2016  . Pilonidal cyst 12/01/2016  . Adjustment disorder with depressed mood 12/01/2016  . Weight loss, non-intentional 12/01/2016  . Elevated amylase 12/01/2016  . Prediabetes 09/16/2016  . Sleep difficulties 09/16/2016  . Diaphoresis 09/16/2016  . Bursitis of right shoulder 09/04/2015  . Lateral meniscus derangement 06/03/2015  . Tear of LCL (lateral collateral ligament) of knee 06/03/2015  . Lumbar radiculopathy 09/19/2014  . Low back pain 01/04/2014  . Bilateral knee pain 01/04/2014  . Diverticulosis of colon without hemorrhage 07/26/2013  . Trigger thumb of left hand 03/06/2013  . Essential hypertension, benign 02/15/2013  . Hyperlipidemia 09/28/2012  . COPD (chronic obstructive pulmonary disease) ([place]) 05/22/2012  . Frequent falls 05/22/2012  . Osteoporosis 05/22/2012    PAYSEUR,[name] PTA 04/07/2017, 3:08 PM  [place] [address] [place], [place], [address] Phone: [phone]   Fax:  [phone]  Name: Kristina Ayala MRN: [id number] Date of Birth: 1945-11-04

## 2017-04-15 ENCOUNTER — Other Ambulatory Visit: Payer: Self-pay | Admitting: Internal Medicine

## 2017-04-15 DIAGNOSIS — Z139 Encounter for screening, unspecified: Secondary | ICD-10-CM

## 2017-04-18 ENCOUNTER — Observation Stay (HOSPITAL_COMMUNITY)
Admission: EM | Admit: 2017-04-18 | Discharge: 2017-04-20 | Disposition: A | Discharge status: Home / Self Care | Payer: Medicare Other | Attending: Internal Medicine | Admitting: Internal Medicine

## 2017-04-18 ENCOUNTER — Ambulatory Visit (INDEPENDENT_AMBULATORY_CARE_PROVIDER_SITE_OTHER): Payer: Medicare Other | Admitting: Family

## 2017-04-18 ENCOUNTER — Encounter (HOSPITAL_COMMUNITY): Payer: Self-pay | Admitting: Emergency Medicine

## 2017-04-18 ENCOUNTER — Other Ambulatory Visit: Payer: Self-pay

## 2017-04-18 ENCOUNTER — Encounter: Payer: Self-pay | Admitting: Family

## 2017-04-18 ENCOUNTER — Emergency Department (HOSPITAL_COMMUNITY): Payer: Medicare Other

## 2017-04-18 VITALS — BP 130/64 | HR 94 | Temp 99.2°F | Ht 60.0 in | Wt 156.1 lb

## 2017-04-18 DIAGNOSIS — I10 Essential (primary) hypertension: Secondary | ICD-10-CM | POA: Diagnosis not present

## 2017-04-18 DIAGNOSIS — J438 Other emphysema: Secondary | ICD-10-CM | POA: Diagnosis not present

## 2017-04-18 DIAGNOSIS — Z8379 Family history of other diseases of the digestive system: Secondary | ICD-10-CM | POA: Diagnosis not present

## 2017-04-18 DIAGNOSIS — Y93G1 Activity, food preparation and clean up: Secondary | ICD-10-CM | POA: Insufficient documentation

## 2017-04-18 DIAGNOSIS — Z9181 History of falling: Secondary | ICD-10-CM | POA: Diagnosis not present

## 2017-04-18 DIAGNOSIS — R079 Chest pain, unspecified: Secondary | ICD-10-CM | POA: Diagnosis not present

## 2017-04-18 DIAGNOSIS — Z8371 Family history of colonic polyps: Secondary | ICD-10-CM | POA: Diagnosis not present

## 2017-04-18 DIAGNOSIS — K5730 Diverticulosis of large intestine without perforation or abscess without bleeding: Secondary | ICD-10-CM | POA: Diagnosis not present

## 2017-04-18 DIAGNOSIS — Z8041 Family history of malignant neoplasm of ovary: Secondary | ICD-10-CM | POA: Diagnosis not present

## 2017-04-18 DIAGNOSIS — E7800 Pure hypercholesterolemia, unspecified: Secondary | ICD-10-CM

## 2017-04-18 DIAGNOSIS — Z79899 Other long term (current) drug therapy: Secondary | ICD-10-CM | POA: Diagnosis not present

## 2017-04-18 DIAGNOSIS — Z794 Long term (current) use of insulin: Secondary | ICD-10-CM | POA: Diagnosis not present

## 2017-04-18 DIAGNOSIS — E785 Hyperlipidemia, unspecified: Secondary | ICD-10-CM | POA: Diagnosis not present

## 2017-04-18 DIAGNOSIS — Z7982 Long term (current) use of aspirin: Secondary | ICD-10-CM | POA: Insufficient documentation

## 2017-04-18 DIAGNOSIS — Z800 Family history of malignant neoplasm of digestive organs: Secondary | ICD-10-CM | POA: Insufficient documentation

## 2017-04-18 DIAGNOSIS — Z87891 Personal history of nicotine dependence: Secondary | ICD-10-CM | POA: Diagnosis not present

## 2017-04-18 DIAGNOSIS — J449 Chronic obstructive pulmonary disease, unspecified: Secondary | ICD-10-CM | POA: Diagnosis present

## 2017-04-18 DIAGNOSIS — R7303 Prediabetes: Secondary | ICD-10-CM | POA: Diagnosis not present

## 2017-04-18 DIAGNOSIS — M810 Age-related osteoporosis without current pathological fracture: Secondary | ICD-10-CM | POA: Diagnosis not present

## 2017-04-18 DIAGNOSIS — I700 Atherosclerosis of aorta: Secondary | ICD-10-CM | POA: Diagnosis not present

## 2017-04-18 DIAGNOSIS — M1990 Unspecified osteoarthritis, unspecified site: Secondary | ICD-10-CM | POA: Insufficient documentation

## 2017-04-18 DIAGNOSIS — Z881 Allergy status to other antibiotic agents status: Secondary | ICD-10-CM | POA: Insufficient documentation

## 2017-04-18 DIAGNOSIS — Z801 Family history of malignant neoplasm of trachea, bronchus and lung: Secondary | ICD-10-CM | POA: Insufficient documentation

## 2017-04-18 DIAGNOSIS — F4321 Adjustment disorder with depressed mood: Secondary | ICD-10-CM | POA: Insufficient documentation

## 2017-04-18 DIAGNOSIS — R0789 Other chest pain: Secondary | ICD-10-CM | POA: Diagnosis not present

## 2017-04-18 DIAGNOSIS — Z880 Allergy status to penicillin: Secondary | ICD-10-CM | POA: Insufficient documentation

## 2017-04-18 DIAGNOSIS — S20212A Contusion of left front wall of thorax, initial encounter: Secondary | ICD-10-CM | POA: Diagnosis not present

## 2017-04-18 DIAGNOSIS — E876 Hypokalemia: Secondary | ICD-10-CM | POA: Diagnosis present

## 2017-04-18 DIAGNOSIS — E119 Type 2 diabetes mellitus without complications: Secondary | ICD-10-CM | POA: Diagnosis present

## 2017-04-18 DIAGNOSIS — Z888 Allergy status to other drugs, medicaments and biological substances status: Secondary | ICD-10-CM | POA: Insufficient documentation

## 2017-04-18 LAB — I-STAT TROPONIN, ED: Troponin i, poc: 0.01 ng/mL (ref 0.00–0.08)

## 2017-04-18 LAB — CBC
HCT: 40.4 % (ref 36.0–46.0)
Hemoglobin: 13.1 g/dL (ref 12.0–15.0)
MCH: 27.1 pg (ref 26.0–34.0)
MCHC: 32.4 g/dL (ref 30.0–36.0)
MCV: 83.5 fL (ref 78.0–100.0)
Platelets: 315 10*3/uL (ref 150–400)
RBC: 4.84 MIL/uL (ref 3.87–5.11)
RDW: 16 % — AB (ref 11.5–15.5)
WBC: 7.4 10*3/uL (ref 4.0–10.5)

## 2017-04-18 LAB — BASIC METABOLIC PANEL
Anion gap: 9 (ref 5–15)
BUN: 18 mg/dL (ref 6–20)
CHLORIDE: 102 mmol/L (ref 101–111)
CO2: 31 mmol/L (ref 22–32)
Calcium: 9.3 mg/dL (ref 8.9–10.3)
Creatinine, Ser: 0.77 mg/dL (ref 0.44–1.00)
GFR calc Af Amer: >60 mL/min (ref >60)
GFR calc non Af Amer: >60 mL/min (ref >60)
GLUCOSE: 98 mg/dL (ref 65–99)
Potassium: 3.5 mmol/L (ref 3.5–5.1)
SODIUM: 142 mmol/L (ref 135–145)

## 2017-04-18 LAB — TROPONIN I

## 2017-04-18 LAB — GLUCOSE, CAPILLARY: Glucose-Capillary: 107 mg/dL — ABNORMAL HIGH (ref 65–99)

## 2017-04-18 LAB — MAGNESIUM: Magnesium: 2 mg/dL (ref 1.7–2.4)

## 2017-04-18 LAB — PHOSPHORUS: Phosphorus: 2.8 mg/dL (ref 2.5–4.6)

## 2017-04-18 MED ORDER — ASPIRIN 81 MG PO CHEW
324.0000 mg | CHEWABLE_TABLET | Freq: Once | ORAL | Status: AC
  Administered 2017-04-18: 324 mg via ORAL
  Filled 2017-04-18: qty 4

## 2017-04-18 MED ORDER — ATORVASTATIN CALCIUM 40 MG PO TABS
40.0000 mg | ORAL_TABLET | Freq: Every day | ORAL | Status: DC
  Administered 2017-04-19: 40 mg via ORAL
  Filled 2017-04-18: qty 1

## 2017-04-18 MED ORDER — ONDANSETRON HCL 4 MG/2ML IJ SOLN: 4.0000 mg | Freq: Four times a day (QID) | INTRAMUSCULAR | Status: DC | PRN

## 2017-04-18 MED ORDER — LISINOPRIL 20 MG PO TABS
40.0000 mg | ORAL_TABLET | Freq: Every day | ORAL | Status: DC
  Administered 2017-04-19 – 2017-04-20 (×2): 40 mg via ORAL
  Filled 2017-04-18 (×2): qty 2

## 2017-04-18 MED ORDER — ENOXAPARIN SODIUM 40 MG/0.4ML ~~LOC~~ SOLN
40.0000 mg | SUBCUTANEOUS | Status: DC
  Administered 2017-04-18: 40 mg via SUBCUTANEOUS
  Filled 2017-04-18: qty 0.4

## 2017-04-18 MED ORDER — ENSURE ENLIVE PO LIQD
237.0000 mL | Freq: Two times a day (BID) | ORAL | Status: DC
  Administered 2017-04-19 – 2017-04-20 (×3): 237 mL via ORAL

## 2017-04-18 MED ORDER — INSULIN ASPART 100 UNIT/ML ~~LOC~~ SOLN: 0.0000 [IU] | Freq: Every day | SUBCUTANEOUS | Status: DC

## 2017-04-18 MED ORDER — POTASSIUM CHLORIDE CRYS ER 20 MEQ PO TBCR
40.0000 meq | EXTENDED_RELEASE_TABLET | Freq: Once | ORAL | Status: AC
  Administered 2017-04-18: 40 meq via ORAL
  Filled 2017-04-18: qty 2

## 2017-04-18 MED ORDER — MORPHINE SULFATE (PF) 4 MG/ML IV SOLN: 2.0000 mg | INTRAVENOUS | Status: DC | PRN

## 2017-04-18 MED ORDER — INSULIN ASPART 100 UNIT/ML ~~LOC~~ SOLN: 0.0000 [IU] | Freq: Three times a day (TID) | SUBCUTANEOUS | Status: DC

## 2017-04-18 MED ORDER — AMLODIPINE BESYLATE 5 MG PO TABS
5.0000 mg | ORAL_TABLET | Freq: Every day | ORAL | Status: DC
  Administered 2017-04-19 – 2017-04-20 (×2): 5 mg via ORAL
  Filled 2017-04-18 (×2): qty 1

## 2017-04-18 MED ORDER — ASPIRIN EC 81 MG PO TBEC
81.0000 mg | DELAYED_RELEASE_TABLET | Freq: Every day | ORAL | Status: DC
  Administered 2017-04-19 – 2017-04-20 (×2): 81 mg via ORAL
  Filled 2017-04-18 (×2): qty 1

## 2017-04-18 MED ORDER — ACETAMINOPHEN 325 MG PO TABS: 650.0000 mg | ORAL_TABLET | ORAL | Status: DC | PRN

## 2017-04-18 MED ORDER — ALPRAZOLAM 0.5 MG PO TABS
0.5000 mg | ORAL_TABLET | Freq: Every evening | ORAL | Status: DC | PRN
  Administered 2017-04-19: 0.5 mg via ORAL
  Filled 2017-04-18: qty 1

## 2017-04-18 NOTE — ED Triage Notes (Signed)
Patient reports sudden onset left side neck pain radiating to left chest this morning while washing dishes. Advised by triage nurse to go to ED. Patient checked in and left to go to PCP. States EKG at PCP showed new onset A-flutter. Patient sent back to ED from PCP for evaluation. Endorses continued left chest pain.

## 2017-04-18 NOTE — ED Provider Notes (Signed)
[facility] DEPT Provider Note   CSN: [id number] Arrival date & time: 04/18/17  1353     History   Chief Complaint Chief Complaint  Patient presents with  . Chest Pain    HPI Kristina Ayala is a 72 y.o. female.  Patient is a 72 year old female with a history of hypertension and hyperlipidemia who presents with jaw pain and possible arrhythmia.  She states she was washing dishes and cleaning up this morning and noted a sudden onset of what she describes as a tightness to her left jaw which radiated down the left side of her neck and left upper chest.  She had some associated shortness of breath.  This lasted about 15-20 minutes and she had no further episodes.  She does complain of chest pain but she is been having left side/sternal chest pain for about 2 weeks now which she attributes to a mattress hitting her in the chest.  She denies any associated nausea or vomiting.  No diaphoresis.  No history of cardiac problems in the past.  She was seen today by her PCP who did an EKG and felt that she had atrial flutter.  She was sent here for admission for further cardiac evaluation.  She is anticipating admission.     Past Medical History:  Diagnosis Date  . Arthritis   . Bursitis    left elbow  . Clostridium difficile infection   . Frequent falls   . HLD (hyperlipidemia)   . Hypertension   . Infectious colitis     Patient Active Problem List   Diagnosis Date Noted  . Nasal congestion 01/11/2017  . RML pneumonia ([place]) 01/10/2017  . Acute bronchitis 12/28/2016  . Degenerative arthritis of left knee 12/01/2016  . Pilonidal cyst 12/01/2016  . Adjustment disorder with depressed mood 12/01/2016  . Weight loss, non-intentional 12/01/2016  . Elevated amylase 12/01/2016  . Prediabetes 09/16/2016  . Sleep difficulties 09/16/2016  . Diaphoresis 09/16/2016  . Bursitis of right shoulder 09/04/2015  . Lateral meniscus derangement 06/03/2015  . Tear  of LCL (lateral collateral ligament) of knee 06/03/2015  . Lumbar radiculopathy 09/19/2014  . Low back pain 01/04/2014  . Bilateral knee pain 01/04/2014  . Diverticulosis of colon without hemorrhage 07/26/2013  . Trigger thumb of left hand 03/06/2013  . Essential hypertension, benign 02/15/2013  . Hyperlipidemia 09/28/2012  . COPD (chronic obstructive pulmonary disease) ([place]) 05/22/2012  . Frequent falls 05/22/2012  . Osteoporosis 05/22/2012  . Chest pain 11/12/2011    Past Surgical History:  Procedure Laterality Date  . COLONOSCOPY    . ORIF PROXIMAL TIBIAL PLATEAU FRACTURE Left 02/19/2012  . POLYPECTOMY    . TONSILLECTOMY       OB History   None      Home Medications    Prior to Admission medications   Medication Sig Start Date End Date Taking? Authorizing Provider  acetaminophen (TYLENOL) 500 MG tablet Take 1,000 mg by mouth every 6 (six) hours as needed (For burn pain.).   Yes [provider]  ALPRAZolam Duanne Moron) 0.5 MG tablet Take 1 tablet (0.5 mg total) by mouth at bedtime as needed for anxiety. -- Office visit needed for further refills 02/21/17  Yes Burns, Claudina Lick, MD  amLODipine (NORVASC) 5 MG tablet TAKE 1 TABLET(5 MG) BY MOUTH DAILY 05/17/16  Yes Burns, Claudina Lick, MD  aspirin EC 81 MG tablet Take 81 mg by mouth daily.   Yes [provider]  atorvastatin (LIPITOR) 40 MG  tablet TAKE 1 TABLET(40 MG) BY MOUTH EVERY MORNING 06/01/16  Yes Burns, Claudina Lick, MD  Cyanocobalamin (VITAMIN B-12) 2500 MCG SUBL Place 1 tablet under the tongue daily.   Yes [provider]  gabapentin (NEURONTIN) 300 MG capsule Take 1 capsule (300 mg total) by mouth at bedtime. 07/25/16  Yes Davonna Belling, MD  hydrochlorothiazide (HYDRODIURIL) 25 MG tablet TAKE 1 TABLET(25 MG) BY MOUTH EVERY MORNING 05/05/16  Yes Burns, Claudina Lick, MD  lisinopril (PRINIVIL,ZESTRIL) 40 MG tablet TAKE 1 TABLET(40 MG) BY MOUTH EVERY MORNING 06/28/16  Yes Burns, Claudina Lick, MD  Magnesium 100 MG CAPS Take 100  mg by mouth daily.   Yes [provider]  meloxicam (MOBIC) 7.5 MG tablet Take 1 tablet (7.5 mg total) by mouth daily. 04/05/17  Yes Jaynee Eagles, PA-C  potassium chloride (KLOR-CON) 8 MEQ tablet Take 2 tablets (16 mEq total) daily by mouth. 12/03/16  Yes Janith Lima, MD  PROLIA 60 MG/ML SOLN injection Inject 60 mg as directed every 6 (six) months. 12/28/16  Yes [provider]  TURMERIC PO Take 1 tablet by mouth daily.   Yes [provider]  acetaminophen-codeine 120-12 MG/5ML solution Take 5 mLs by mouth at bedtime as needed (cough suppression). Patient not taking: Reported on 04/18/2017 01/21/17   Jaynee Eagles, PA-C  cetirizine (ZYRTEC) 10 MG tablet Take 1 tablet (10 mg total) by mouth daily. Patient not taking: Reported on 04/18/2017 01/21/17   Jaynee Eagles, PA-C  ferrous sulfate 325 (65 FE) MG tablet Take 1 tablet (325 mg total) by mouth daily with breakfast. Patient not taking: Reported on 04/18/2017 03/29/14   Lyndal Pulley, DO  methocarbamol (ROBAXIN) 500 MG tablet Take 1 tablet (500 mg total) by mouth every 8 (eight) hours as needed for muscle spasms. Patient not taking: Reported on 04/18/2017 04/05/17   Jaynee Eagles, PA-C  pseudoephedrine (SUDAFED) 60 MG tablet Take 1 tablet (60 mg total) by mouth every 8 (eight) hours as needed. Patient not taking: Reported on 04/18/2017 03/17/17   Jaynee Eagles, PA-C    Family History Family History  Problem Relation Age of Onset  . Diverticulosis Mother   . Ovarian cancer Mother   . Pancreatic cancer Father   . Lung cancer Father   . Colon polyps Sister   . Colon cancer Neg Hx     Social History Social History   Tobacco Use  . Smoking status: Former Smoker    Packs/day: 0.25    Years: 50.00    Pack years: 12.50    Types: Cigarettes    Last attempt to quit: 08/28/2014    Years since quitting: 2.6  . Smokeless tobacco: Never Used  . Tobacco comment: 1 pack every 3 days  Substance Use Topics  . Alcohol use: No     Alcohol/week: 0.0 oz  . Drug use: No     Allergies   Doxycycline; Minocycline; Wellbutrin [bupropion]; and Penicillins   Review of Systems Review of Systems  Constitutional: Negative for chills, diaphoresis, fatigue and fever.  HENT: Negative for congestion, rhinorrhea and sneezing.   Eyes: Negative.   Respiratory: Positive for chest tightness and shortness of breath. Negative for cough.   Cardiovascular: Positive for chest pain. Negative for leg swelling.  Gastrointestinal: Negative for abdominal pain, blood in stool, diarrhea, nausea and vomiting.  Genitourinary: Negative for difficulty urinating, flank pain, frequency and hematuria.  Musculoskeletal: Negative for arthralgias and back pain.  Skin: Negative for rash.  Neurological: Negative for dizziness, speech  difficulty, weakness, numbness and headaches.     Physical Exam Updated Vital Signs BP 136/79   Pulse 84   Temp 98.2 F (36.8 C) (Oral)   Resp 16   Ht 5' (1.524 m)   Wt 70.8 kg (156 lb 1.3 oz)   SpO2 99%   BMI 30.48 kg/m   Physical Exam  Constitutional: She is oriented to person, place, and time. She appears well-developed and well-nourished.  HENT:  Head: Normocephalic and atraumatic.  Eyes: Pupils are equal, round, and reactive to light.  Neck: Normal range of motion. Neck supple.  Cardiovascular: Normal rate, regular rhythm and normal heart sounds.  Pulmonary/Chest: Effort normal and breath sounds normal. No respiratory distress. She has no wheezes. She has no rales. She exhibits tenderness (Positive mild tenderness over the sternum and left chest wall, no crepitus or deformity).  Abdominal: Soft. Bowel sounds are normal. There is no tenderness. There is no rebound and no guarding.  Musculoskeletal: Normal range of motion. She exhibits no edema.  No edema or calf tenderness  Lymphadenopathy:    She has no cervical adenopathy.  Neurological: She is alert and oriented to person, place, and time.  Skin:  Skin is warm and dry. No rash noted.  Psychiatric: She has a normal mood and affect.     ED Treatments / Results  Labs (all labs ordered are listed, but only abnormal results are displayed) Labs Reviewed  CBC - Abnormal; Notable for the following components:      Result Value   RDW 16.0 (*)    All other components within normal limits  BASIC METABOLIC PANEL  I-STAT TROPONIN, ED    EKG EKG Interpretation  Date/Time:  Monday April 18 2017 14:17:20 EDT Ventricular Rate:  90 PR Interval:    QRS Duration: 90 QT Interval:  361 QTC Calculation: 442 R Axis:   23 Text Interpretation:  Sinus rhythm Probable left atrial enlargement Low voltage, precordial leads No significant change since last tracing Confirmed by Dorie Rank [phone]) on 04/18/2017 2:24:59 PM   Radiology Dg Chest 2 View  Result Date: 04/18/2017 CLINICAL DATA:  Sudden onset of LEFT side neck pain radiating to LEFT chest for 3 weeks increased in severity this morning, hypertension, former smoker EXAM: CHEST - 2 VIEW COMPARISON:  04/05/2017 FINDINGS: Borderline enlargement of cardiac silhouette. Atherosclerotic calcification aorta. Mediastinal contours and pulmonary vascularity normal. Lungs clear. No acute infiltrate, pleural effusion or pneumothorax. Bones unremarkable. IMPRESSION: No acute abnormalities. Electronically Signed   By: Lavonia Dana M.D.   On: 04/18/2017 17:13    Procedures Procedures (including critical care time)  Medications Ordered in ED Medications - No data to display   Initial Impression / Assessment and Plan / ED Course  I have reviewed the triage vital signs and the nursing notes.  Pertinent labs & imaging results that were available during my care of the patient were reviewed by me and considered in my medical decision making (see chart for details).     Patient is a 72 year old female who presents with neck/jaw pain with some associated shortness of breath.  It is not reproducible.  She could  not reproduce it this morning with movement.  It was felt that she had a flutter on her EKG at her PCPs office but I reviewed this and I do not feel that it looks consistent with atrial flutter.  She is in a sinus rhythm here.  I do not see any ischemic changes.  Her troponin is negative.  Her chest x-ray does not exhibit any acute abnormalities.  Given her symptoms and risk factors, I spoke with Dr. Roel Cluck with the hospitalist service who will admit her for further evaluation  Final Clinical Impressions(s) / ED Diagnoses   Final diagnoses:  Atypical chest pain    ED Discharge Orders    None       Malvin Johns, MD 04/18/17 [phone]

## 2017-04-18 NOTE — H&P (Signed)
Kristina Ayala CLE:[id number] DOB: 1945-06-21 DOA: 04/18/2017     PCP: [name], MD   Outpatient Specialists:   NONE   Patient arrived to ER on 04/18/17 at 1353  Patient coming from:  home Lives  With family    Chief Complaint:  Chief Complaint  Patient presents with  . Chest Pain    HPI: Kristina Ayala is a 72 y.o. female with medical history significant of hypertension prediabetes, chronic back pain, arthritis, history of C. difficile, HLD, COPD, HTN    Presented with chest pain started as a gel pain but radiated down to her chest and left arm.  She was doing dishes at the time she felt tightness in her chest associated some shortness of breath.  Pain lasted about 20 minutes and have resolved at this point.  No further episodes. Reports improved with rest.  She has been having intermittent symptoms like this for the past 2 weeks but this had to be worse.  She reports that the sternal pain was started after she was hit by the mattress.  She was feeling some palpitations on occasion.  Patient called into her PCP who told her to come to emergency department but because the waiting was too long she presented to [facility] urgent care instead No associated fevers chills nausea vomiting. Reports increased stress at home While at [facility] urgent care EKG was done physician at that time was concerned which it was showing atrial flutter and she was told to go directly to emergency department she refused to go by ambulance and drove herself. Reports no prior cardiac work up  She has been worried about her family who has been staying with her.  Has been under a lot of stress lately.   Reports 17 lb weight loss and fatigue attributes to stress.  While in ER: EKG felt to be more sinus rhythm  Significant initial  Findings: Abnormal Labs Reviewed  CBC - Abnormal; Notable for the following components:      Result Value   RDW 16.0 (*)    All other components within normal limits     Na 142 K 3.5  Cr  stable,    Lab Results  Component Value Date   CREATININE 0.77 04/18/2017   CREATININE 0.79 01/06/2017   CREATININE 0.80 12/01/2016      WBC 7.4 HG/HCT  Stable     Component Value Date/Time   HGB 13.1 04/18/2017 1431   HCT 40.4 04/18/2017 1431     Troponin (Point of Care Test) Recent Labs    04/18/17 1436  TROPIPOC 0.01       UA not ordered   CXR -  NON acute   ECG:  Personally reviewed by me showing: HR : 90 Rhythm:  NSR, possibly atrial enlargement Ischemic changes  no evidence of ischemic changes QTC 440    ED Triage Vitals  Enc Vitals Group     BP 04/18/17 1421 127/76     Pulse Rate 04/18/17 1421 91     Resp 04/18/17 1421 20     Temp 04/18/17 1421 98.2 F (36.8 C)     Temp Source 04/18/17 1421 Oral     SpO2 04/18/17 1421 99 %     Weight 04/18/17 1741 156 lb 1.3 oz (70.8 kg)     Height 04/18/17 1741 5' (1.524 m)     Head Circumference --      Peak Flow --      Pain  Score --      Pain Loc --      Pain Edu? --      Excl. in [facility]? --   TMAX(24)@     on arrival  ED Triage Vitals  Enc Vitals Group     BP 04/18/17 1421 127/76     Pulse Rate 04/18/17 1421 91     Resp 04/18/17 1421 20     Temp 04/18/17 1421 98.2 F (36.8 C)     Temp Source 04/18/17 1421 Oral     SpO2 04/18/17 1421 99 %     Weight 04/18/17 1741 156 lb 1.3 oz (70.8 kg)     Height 04/18/17 1741 5' (1.524 m)     Head Circumference --      Peak Flow --      Pain Score --      Pain Loc --      Pain Edu? --      Excl. in [place]? --      Latest  Blood pressure 136/79, pulse 84, temperature 98.2 F (36.8 C), temperature source Oral, resp. rate 16, height 5' (1.524 m), weight 70.8 kg (156 lb 1.3 oz), SpO2 99 %.   Following Medications were ordered in ER: Medications  aspirin chewable tablet 324 mg (324 mg Oral Given 04/18/17 1840)  potassium chloride SA (K-DUR,KLOR-CON) CR tablet 40 mEq (40 mEq Oral Given 04/18/17 1908)     Hospitalist was called for admission  for chest pain evaluation  Regarding pertinent Chronic problems: States not a true diabetic does not check her sugars. Hx of HTN on Norvasc, lisinopril and HCTZ  Review of Systems:    Pertinent positives include: Chest pain, neck pain, weight loss   Constitutional:  No weight loss, night sweats, Fevers, chills, fatigue HEENT:  No headaches, Difficulty swallowing,Tooth/dental problems,Sore throat,  No sneezing, itching, ear ache, nasal congestion, post nasal drip,  Cardio-vascular:   Orthopnea, PND, anasarca, dizziness, palpitations.no Bilateral lower extremity swelling    GI:  No heartburn, indigestion, abdominal pain, nausea, vomiting, diarrhea, change in bowel habits, loss of appetite, melena, blood in stool, hematemesis Resp:  no shortness of breath at rest. No dyspnea on exertion, No excess mucus, no productive cough, No non-productive cough, No coughing up of blood.No change in color of mucus.No wheezing. Skin:  no rash or lesions. No jaundice GU:  no dysuria, change in color of urine, no urgency or frequency. No straining to urinate.  No flank pain.  Musculoskeletal:  No joint pain or no joint swelling. No decreased range of motion. No back pain.  Psych:  No change in mood or affect. No depression or anxiety. No memory loss.  Neuro: no localizing neurological complaints, no tingling, no weakness, no double vision, no gait abnormality, no slurred speech, no confusion  As per HPI otherwise 10 point review of systems negative.     Past Medical History: Past Medical History:  Diagnosis Date  . Arthritis   . Bursitis    left elbow  . Clostridium difficile infection   . Frequent falls   . HLD (hyperlipidemia)   . Hypertension   . Infectious colitis      Past Surgical History:  Procedure Laterality Date  . COLONOSCOPY    . ORIF PROXIMAL TIBIAL PLATEAU FRACTURE Left 02/19/2012  . POLYPECTOMY    . TONSILLECTOMY      Social History:  Ambulatory   Independently      reports that she quit smoking about 2 years ago.  Her smoking use included cigarettes. She has a 12.50 pack-year smoking history. She has never used smokeless tobacco. She reports that she does not drink alcohol or use drugs.    Family History:   Family History  Problem Relation Age of Onset  . Diverticulosis Mother   . Ovarian cancer Mother   . Pancreatic cancer Father   . Lung cancer Father   . Colon polyps Sister   . Colon cancer Neg Hx     Allergies: Allergies  Allergen Reactions  . Doxycycline Diarrhea  . Minocycline Diarrhea  . Wellbutrin [Bupropion] Swelling    Per pt her tongue was swollen and sore/symptoms stopped once the medication was discontinued.   [name] Penicillins Rash    Has patient had a PCN reaction causing immediate rash, facial/tongue/throat swelling, SOB or lightheadedness with hypotension: yes Has patient had a PCN reaction causing severe rash involving mucus membranes or skin necrosis: no Has patient had a PCN reaction that required hospitalization: unknown Has patient had a PCN reaction occurring within the last 10 years: no If all of the above answers are "NO", then may proceed with Cephalosporin use.      Prior to Admission medications   Medication Sig Start Date End Date Taking? Authorizing Provider  acetaminophen (TYLENOL) 500 MG tablet Take 1,000 mg by mouth every 6 (six) hours as needed (For burn pain.).   Yes [provider]  ALPRAZolam Duanne Moron) 0.5 MG tablet Take 1 tablet (0.5 mg total) by mouth at bedtime as needed for anxiety. -- Office visit needed for further refills 02/21/17  Yes Burns, Claudina Lick, MD  amLODipine (NORVASC) 5 MG tablet TAKE 1 TABLET(5 MG) BY MOUTH DAILY 05/17/16  Yes Burns, Claudina Lick, MD  aspirin EC 81 MG tablet Take 81 mg by mouth daily.   Yes [provider]  atorvastatin (LIPITOR) 40 MG tablet TAKE 1 TABLET(40 MG) BY MOUTH EVERY MORNING 06/01/16  Yes Burns, Claudina Lick, MD  Cyanocobalamin (VITAMIN B-12) 2500 MCG SUBL  Place 1 tablet under the tongue daily.   Yes [provider]  gabapentin (NEURONTIN) 300 MG capsule Take 1 capsule (300 mg total) by mouth at bedtime. 07/25/16  Yes Davonna Belling, MD  hydrochlorothiazide (HYDRODIURIL) 25 MG tablet TAKE 1 TABLET(25 MG) BY MOUTH EVERY MORNING 05/05/16  Yes Burns, Claudina Lick, MD  lisinopril (PRINIVIL,ZESTRIL) 40 MG tablet TAKE 1 TABLET(40 MG) BY MOUTH EVERY MORNING 06/28/16  Yes Burns, Claudina Lick, MD  Magnesium 100 MG CAPS Take 100 mg by mouth daily.   Yes [provider]  meloxicam (MOBIC) 7.5 MG tablet Take 1 tablet (7.5 mg total) by mouth daily. 04/05/17  Yes Jaynee Eagles, PA-C  potassium chloride (KLOR-CON) 8 MEQ tablet Take 2 tablets (16 mEq total) daily by mouth. 12/03/16  Yes Janith Lima, MD  PROLIA 60 MG/ML SOLN injection Inject 60 mg as directed every 6 (six) months. 12/28/16  Yes [provider]  TURMERIC PO Take 1 tablet by mouth daily.   Yes [provider]  acetaminophen-codeine 120-12 MG/5ML solution Take 5 mLs by mouth at bedtime as needed (cough suppression). Patient not taking: Reported on 04/18/2017 01/21/17   Jaynee Eagles, PA-C  cetirizine (ZYRTEC) 10 MG tablet Take 1 tablet (10 mg total) by mouth daily. Patient not taking: Reported on 04/18/2017 01/21/17   Jaynee Eagles, PA-C  ferrous sulfate 325 (65 FE) MG tablet Take 1 tablet (325 mg total) by mouth daily with breakfast. Patient not taking: Reported on 04/18/2017 03/29/14  Hulan Saas M, DO  methocarbamol (ROBAXIN) 500 MG tablet Take 1 tablet (500 mg total) by mouth every 8 (eight) hours as needed for muscle spasms. Patient not taking: Reported on 04/18/2017 04/05/17   Jaynee Eagles, PA-C  pseudoephedrine (SUDAFED) 60 MG tablet Take 1 tablet (60 mg total) by mouth every 8 (eight) hours as needed. Patient not taking: Reported on 04/18/2017 03/17/17   Jaynee Eagles, PA-C   Physical Exam: Blood pressure 136/79, pulse 84, temperature 98.2 F (36.8 C), temperature source Oral,  resp. rate 16, height 5' (1.524 m), weight 70.8 kg (156 lb 1.3 oz), SpO2 99 %. 1. General:  in No Acute distress  well -appearing 2. Psychological: Alert and  Oriented 3. Head/ENT:    Dry Mucous Membranes                          Head Non traumatic, neck supple                           Poor Dentition 4. SKIN:  decreased Skin turgor,  Skin clean Dry and intact no rash, multiple AK on her back 5. Heart: Regular rate and rhythm no  Murmur, no Rub or gallop 6. Lungs:  Clear to auscultation bilaterally, no wheezes or crackles   7. Abdomen: Soft,  non-tender, Non distended   obese  bowel sounds present 8. Lower extremities: no clubbing, cyanosis, or edema 9. Neurologically Grossly intact, moving all 4 extremities equally   10. MSK: Normal range of motion   LABS:     Recent Labs  Lab 04/18/17 1431  WBC 7.4  HGB 13.1  HCT 40.4  MCV 83.5  PLT 568   Basic Metabolic Panel: Recent Labs  Lab 04/18/17 1431  NA 142  K 3.5  CL 102  CO2 31  GLUCOSE 98  BUN 18  CREATININE 0.77  CALCIUM 9.3      No results for input(s): AST, ALT, ALKPHOS, BILITOT, PROT, ALBUMIN in the last 168 hours. No results for input(s): LIPASE, AMYLASE in the last 168 hours. No results for input(s): AMMONIA in the last 168 hours.    HbA1C: No results for input(s): HGBA1C in the last 72 hours. CBG: No results for input(s): GLUCAP in the last 168 hours.    Urine analysis:    Component Value Date/Time   COLORURINE YELLOW 12/01/2016 1451   APPEARANCEUR CLEAR 12/01/2016 1451   LABSPEC >=1.030 (A) 12/01/2016 1451   PHURINE 5.5 12/01/2016 1451   GLUCOSEU NEGATIVE 12/01/2016 1451   HGBUR NEGATIVE 12/01/2016 1451   BILIRUBINUR NEGATIVE 12/01/2016 1451   BILIRUBINUR neg 02/19/2015 1356   KETONESUR NEGATIVE 12/01/2016 1451   PROTEINUR NEGATIVE 10/04/2015 1704   UROBILINOGEN 0.2 12/01/2016 1451   NITRITE NEGATIVE 12/01/2016 1451   LEUKOCYTESUR NEGATIVE 12/01/2016 1451       Cultures:    Component  Value Date/Time   SDES THROAT 12/19/2015 0638   SPECREQUEST NONE Reflexed from [id number] 12/19/2015 0638   CULT  12/19/2015 0638    NO GROUP A STREP (S.PYOGENES) ISOLATED Performed at [place] [facility] 12/21/2015 FINAL 12/19/2015 0017     Radiological Exams on Admission: Dg Chest 2 View  Result Date: 04/18/2017 CLINICAL DATA:  Sudden onset of LEFT side neck pain radiating to LEFT chest for 3 weeks increased in severity this morning, hypertension, former smoker EXAM: CHEST - 2 VIEW COMPARISON:  04/05/2017 FINDINGS: Borderline enlargement  of cardiac silhouette. Atherosclerotic calcification aorta. Mediastinal contours and pulmonary vascularity normal. Lungs clear. No acute infiltrate, pleural effusion or pneumothorax. Bones unremarkable. IMPRESSION: No acute abnormalities. Electronically Signed   By: Lavonia Dana M.D.   On: 04/18/2017 17:13    Chart has been reviewed    Assessment/Plan  72 y.o. female with medical history significant of hypertension prediabetes, chronic back pain, arthritis, history of C. difficile, HLD, COPD, HTN   Admitted for chest pain evaluation  Present on Admission: . Chest pain - - H=  1 ,E=0  ,A=  2 , R   2 , T 0  ,  for the  Total of 5 therefore will admit for observation and further evaluation ( Risk of MACE: Scores 0-3  of 0.9-1.7%.,  4-6: 12-16.6% , Scores ?7: 50-65% )   -  Other explanation for chest pain could be  musculoskeletal   - monitor on telemetry, cycle cardiac enzymes, obtain serial ECG and  ECHO in AM.   - Daily aspirin -  Further risk stratify with lipid panel, hgA1C, obtain TSH.  Make sure patient is on Aspirin.  We will notify cardiology regarding patient's admission. Further management depends on pending  workup  . Hyperlipidemia - continue statin . COPD (chronic obstructive pulmonary disease) (HCC) - currently stable, no longer smokes . Essential hypertension, benign - continue home medicaitons . Prediabetes - diet  controled  - Order Sensitive   SSI   -  check TSH and HgA1C    Weight loss - colonoscopy 2 y ago, mammogram scheduled in May will need outpatient work up . Hypokalemia - - will replace and repeat in AM,  check magnesium level and replace as needed    Other plan as per orders.  DVT prophylaxis:   Lovenox     Code Status:  FULL CODE as per patient    Family Communication:   Family not  at  Bedside    Disposition Plan:      To home once workup is complete and patient is stable                                            Consults called: email cardiology  Admission status:    obs   Level of care    tele            I have spent a total of 56 min on this admission   Abie Cheek 04/18/2017, 7:44 PM    [facility]  Pager [phone]   after 2 AM please page floor coverage PA If 7AM-7PM, please contact the day team taking care of the patient  [email or web address]  Password [email or web address]

## 2017-04-18 NOTE — Progress Notes (Signed)
Kristina Ayala is a 72 y.o. female with the following history as recorded in EpicCare:  Patient Active Problem List   Diagnosis Date Noted  . Nasal congestion 01/11/2017  . RML pneumonia ([place]) 01/10/2017  . Acute bronchitis 12/28/2016  . Degenerative arthritis of left knee 12/01/2016  . Pilonidal cyst 12/01/2016  . Adjustment disorder with depressed mood 12/01/2016  . Weight loss, non-intentional 12/01/2016  . Elevated amylase 12/01/2016  . Prediabetes 09/16/2016  . Sleep difficulties 09/16/2016  . Diaphoresis 09/16/2016  . Bursitis of right shoulder 09/04/2015  . Lateral meniscus derangement 06/03/2015  . Tear of LCL (lateral collateral ligament) of knee 06/03/2015  . Lumbar radiculopathy 09/19/2014  . Low back pain 01/04/2014  . Bilateral knee pain 01/04/2014  . Diverticulosis of colon without hemorrhage 07/26/2013  . Trigger thumb of left hand 03/06/2013  . Essential hypertension, benign 02/15/2013  . Hyperlipidemia 09/28/2012  . COPD (chronic obstructive pulmonary disease) ([place]) 05/22/2012  . Frequent falls 05/22/2012  . Osteoporosis 05/22/2012    Current Outpatient Medications  Medication Sig Dispense Refill  . acetaminophen (TYLENOL) 500 MG tablet Take 1,000 mg by mouth every 6 (six) hours as needed (For burn pain.).    [name] acetaminophen-codeine 120-12 MG/5ML solution Take 5 mLs by mouth at bedtime as needed (cough suppression). 120 mL 0  . ALPRAZolam (XANAX) 0.5 MG tablet Take 1 tablet (0.5 mg total) by mouth at bedtime as needed for anxiety. -- Office visit needed for further refills 30 tablet 0  . amLODipine (NORVASC) 5 MG tablet TAKE 1 TABLET(5 MG) BY MOUTH DAILY 90 tablet 2  . atorvastatin (LIPITOR) 40 MG tablet TAKE 1 TABLET(40 MG) BY MOUTH EVERY MORNING 90 tablet 1  . cetirizine (ZYRTEC) 10 MG tablet Take 1 tablet (10 mg total) by mouth daily. 30 tablet 11  . Cyanocobalamin (VITAMIN B-12) 2500 MCG SUBL Place 1 tablet under the tongue daily.    . ferrous sulfate  325 (65 FE) MG tablet Take 1 tablet (325 mg total) by mouth daily with breakfast. 30 tablet 3  . gabapentin (NEURONTIN) 300 MG capsule Take 1 capsule (300 mg total) by mouth at bedtime. 30 capsule 3  . hydrochlorothiazide (HYDRODIURIL) 25 MG tablet TAKE 1 TABLET(25 MG) BY MOUTH EVERY MORNING 90 tablet 2  . lisinopril (PRINIVIL,ZESTRIL) 40 MG tablet TAKE 1 TABLET(40 MG) BY MOUTH EVERY MORNING 90 tablet 2  . Magnesium 100 MG CAPS Take 100 mg by mouth daily.    . meloxicam (MOBIC) 7.5 MG tablet Take 1 tablet (7.5 mg total) by mouth daily. 30 tablet 0  . methocarbamol (ROBAXIN) 500 MG tablet Take 1 tablet (500 mg total) by mouth every 8 (eight) hours as needed for muscle spasms. 90 tablet 1  . potassium chloride (KLOR-CON) 8 MEQ tablet Take 2 tablets (16 mEq total) daily by mouth. 60 tablet 3  . PROLIA 60 MG/ML SOLN injection Inject 60 mg as directed every 6 (six) months.  0  . pseudoephedrine (SUDAFED) 60 MG tablet Take 1 tablet (60 mg total) by mouth every 8 (eight) hours as needed. 30 tablet 0  . TURMERIC PO Take 1 tablet by mouth daily.     No current facility-administered medications for this visit.     Allergies: Doxycycline; Minocycline; Wellbutrin [bupropion]; and Penicillins  Past Medical History:  Diagnosis Date  . Arthritis   . Bursitis    left elbow  . Clostridium difficile infection   . Frequent falls   . HLD (hyperlipidemia)   . Hypertension   .  Infectious colitis     Past Surgical History:  Procedure Laterality Date  . COLONOSCOPY    . ORIF PROXIMAL TIBIAL PLATEAU FRACTURE Left 02/19/2012  . POLYPECTOMY    . TONSILLECTOMY      Family History  Problem Relation Age of Onset  . Diverticulosis Mother   . Ovarian cancer Mother   . Pancreatic cancer Father   . Lung cancer Father   . Colon polyps Sister   . Colon cancer Neg Hx     Social History   Tobacco Use  . Smoking status: Former Smoker    Packs/day: 0.25    Years: 50.00    Pack years: 12.50    Types:  Cigarettes    Last attempt to quit: 08/28/2014    Years since quitting: 2.6  . Smokeless tobacco: Never Used  . Tobacco comment: 1 pack every 3 days  Substance Use Topics  . Alcohol use: No    Alcohol/week: 0.0 oz    Subjective:  Patient presents with concerns for sudden onset of severe left sided pain in neck/ chest this am while doing the dishes; is experiencing "tightness" in chest as well; feels like symptoms have been present x 2 weeks- worse in the past few days; also admits that has been feeling her heart beating irregularly recently.  Was triaged this am and told to go directly to the ER; she notes that when she got to the ER, the wait was too long so she came over here; is under increased stress recently; denies any vision changes or headache;   Objective:  Vitals:   04/18/17 1204  BP: 130/64  Pulse: 94  Temp: 99.2 F (37.3 C)  TempSrc: Oral  SpO2: 100%  Weight: 156 lb 1.3 oz (70.8 kg)  Height: 5' (1.524 m)    General: Well developed, well nourished, in no acute distress  Skin : Warm and dry.  Head: Normocephalic and atraumatic  Eyes: Sclera and conjunctiva clear; pupils round and reactive to light; extraocular movements intact  Ears: External normal; canals clear; tympanic membranes normal  Oropharynx: Pink, supple. No suspicious lesions  Neck: Supple without thyromegaly, adenopathy  Lungs: Respirations unlabored; clear to auscultation bilaterally without wheeze, rales, rhonchi  CVS exam: Irregular rhythm noted today; Abdomen: Soft; nontender; nondistended; normoactive bowel sounds; no masses or hepatosplenomegaly  Musculoskeletal: No deformities; no active joint inflammation  Extremities: No edema, cyanosis, clubbing  Vessels: Symmetric bilaterally  Neurologic: Alert and oriented; speech intact; face symmetrical; moves all extremities well; CNII-XII intact without focal deficit  Assessment:  1. Chest pain, unspecified type     Plan:  Check EKG today- atrial flutter  noted; patient is told to go directly to ER; she opts against going by ambulance and notes she has to drive to get her daughter first; she promises to return to ER within the hour- patient prefers [facility]; ER nurse at [employer];  Follow-up as directed once discharged from hospital;  No follow-ups on file.  Orders Placed This Encounter  Procedures  . EKG 12-Lead    Requested Prescriptions    No prescriptions requested or ordered in this encounter

## 2017-04-18 NOTE — Patient Instructions (Signed)
Atrial Flutter °Atrial flutter is a type of abnormal heart rhythm (arrhythmia). In atrial flutter, the heartbeat is fast but regular. There are two types of atrial flutter: °· Paroxysmal atrial flutter. This type starts suddenly. It usually stops on its own soon after it starts. °· Permanent atrial flutter. This type does not go away. ° °What are the causes? °This condition may be caused by: °· A heart condition or problem, such as: °? A heart attack. °? Heart failure. °? A heart valve problem. °· A lung problem, such as: °? A blood clot in the lungs (pulmonary embolism, or PE). °? Chronic obstructive pulmonary disease. °· Poorly controlled high blood pressure (hypertension). °· Hyperthyroidism. °· Caffeine. °· Some decongestant cold medicines. °· Low levels of minerals called electrolytes in the blood. °· Cocaine. ° °What increases the risk? °This condition is more likely to develop in: °· Elderly adults. °· Men. ° °What are the signs or symptoms? °Symptoms of this condition include: °· A feeling that your heart is pounding or racing (palpitations). °· Shortness of breath. °· Chest pain. °· Feeling light-headed. °· Dizziness. °· Fainting. ° °How is this diagnosed? °This condition may be diagnosed with tests, including: °· An electrocardiogram (ECG). This is a painless test that records electrical signals in the heart. °· Holter monitoring. For this test, you wear a device that records your heartbeat for 1-2 days. °· Cardiac event monitoring. For this test, you wear a device that records your heartbeat for up to 30 days. °· An echocardiogram. This is a painless test that uses sound waves to make a picture of your heart. °· Stress test. This test records your heartbeat while you exercise. °· Blood tests. ° °How is this treated? °This condition may be treated with: °· Treatment of any underlying conditions. °· Medicine to make your heart beat more slowly. °· Medicine to keep the condition from coming back. °· A  procedure to keep the condition under control. Some procedures to do this include: °? Cardioversion. During this procedure, medicines or an electrical shock are given to make the heart beat normally. °? Ablation. During this procedure, the heart tissue that is causing the problem is destroyed. This procedure may be done if atrial flutter lasts a long time or happens often. ° °Follow these instructions at home: °· Take over-the-counter and prescription medicines only as told by your health care provider. °· Do not take any new medicines without talking to your health care provider. °· Do not use tobacco products, including cigarettes, chewing tobacco, or e-cigarettes. If you need help quitting, ask your health care provider. °· Limit alcohol intake to no more than 1 drink per day for nonpregnant women and 2 drinks per day for men. One drink equals 12 oz of beer, 5 oz of wine, or 1½ oz of hard liquor. °· Try to reduce any stress. Stress can make your symptoms worse. °Contact a health care provider if: °· Your symptoms get worse. °Get help right away if: °· You are dizzy. °· You feel like fainting or you faint. °· You have shortness of breath. °· You feel pain or pressure in your chest. °· You suddenly feel nauseous or you suddenly vomit. °· There is a sudden change in your ability to speak, eat, or move. °· You are sweating a lot for no reason. °This information is not intended to replace advice given to you by your health care provider. Make sure you discuss any questions you have with your health care   provider. °Document Released: 05/30/2008 Document Revised: 05/21/2015 Document Reviewed: 07/26/2014 °Elsevier Interactive Patient Education © 2018 [employer]vier Inc. ° °

## 2017-04-18 NOTE — ED Notes (Signed)
ED TO INPATIENT HANDOFF REPORT  Name/Age/Gender Kristina Ayala 72 y.o. female  Code Status Code Status History    Date Active Date Inactive Code Status Order ID Comments User Context   08/04/2011 2115 08/08/2011 1433 Full Code [id number]  Pecola Leisure, RN Inpatient      Home/SNF/Other Home  Chief Complaint irregular heart beat  Level of Care/Admitting Diagnosis ED Disposition    ED Disposition Condition [place]: [facility] [625638]  Level of Care: Telemetry [5]  Admit to tele based on following criteria: Monitor for Ischemic changes  Diagnosis: Chest pain [937342]  Admitting Physician: Toy Baker [[id number]]  Attending Physician: Toy Baker [[id number]]  PT Class (Do Not Modify): Observation [104]  PT Acc Code (Do Not Modify): Observation [10022]       Medical History Past Medical History:  Diagnosis Date  . Arthritis   . Bursitis    left elbow  . Clostridium difficile infection   . Frequent falls   . HLD (hyperlipidemia)   . Hypertension   . Infectious colitis     Allergies Allergies  Allergen Reactions  . Doxycycline Diarrhea  . Minocycline Diarrhea  . Wellbutrin [Bupropion] Swelling    Per pt her tongue was swollen and sore/symptoms stopped once the medication was discontinued.   [name] Penicillins Rash    Has patient had a PCN reaction causing immediate rash, facial/tongue/throat swelling, SOB or lightheadedness with hypotension: yes Has patient had a PCN reaction causing severe rash involving mucus membranes or skin necrosis: no Has patient had a PCN reaction that required hospitalization: unknown Has patient had a PCN reaction occurring within the last 10 years: no If all of the above answers are "NO", then may proceed with Cephalosporin use.     IV Location/Drains/Wounds Patient Lines/Drains/Airways Status   Active Line/Drains/Airways    Name:   Placement date:   Placement time:   Site:    Days:   Peripheral IV 04/18/17 Left Wrist   04/18/17    1850    Wrist   less than 1          Labs/Imaging Results for orders placed or performed during the hospital encounter of 04/18/17 (from the past 48 hour(s))  Basic metabolic panel     Status: None   Collection Time: 04/18/17  2:31 PM  Result Value Ref Range   Sodium 142 135 - 145 mmol/L   Potassium 3.5 3.5 - 5.1 mmol/L   Chloride 102 101 - 111 mmol/L   CO2 31 22 - 32 mmol/L   Glucose, Bld 98 65 - 99 mg/dL   BUN 18 6 - 20 mg/dL   Creatinine, Ser 0.77 0.44 - 1.00 mg/dL   Calcium 9.3 8.9 - 10.3 mg/dL   GFR calc non Af Amer >60 >60 mL/min   GFR calc Af Amer >60 >60 mL/min    Comment: (NOTE) The eGFR has been calculated using the CKD EPI equation. This calculation has not been validated in all clinical situations. eGFR's persistently <60 mL/min signify possible Chronic Kidney Disease.    Anion gap 9 5 - 15    Comment: Performed at [facility], [place] [address], [place], [place] [address]  CBC     Status: Abnormal   Collection Time: 04/18/17  2:31 PM  Result Value Ref Range   WBC 7.4 4.0 - 10.5 K/uL   RBC 4.84 3.87 - 5.11 MIL/uL   Hemoglobin 13.1 12.0 - 15.0 g/dL  HCT 40.4 36.0 - 46.0 %   MCV 83.5 78.0 - 100.0 fL   MCH 27.1 26.0 - 34.0 pg   MCHC 32.4 30.0 - 36.0 g/dL   RDW 16.0 (H) 11.5 - 15.5 %   Platelets 315 150 - 400 K/uL    Comment: Performed at [facility], [place] [address], [place], [place] [address]  I-stat troponin, ED     Status: None   Collection Time: 04/18/17  2:36 PM  Result Value Ref Range   Troponin i, poc 0.01 0.00 - 0.08 ng/mL   Comment 3            Comment: Due to the release kinetics of cTnI, a negative result within the first hours of the onset of symptoms does not rule out myocardial infarction with certainty. If myocardial infarction is still suspected, repeat the test at appropriate intervals.    Dg Chest 2 View  Result Date: 04/18/2017 CLINICAL  DATA:  Sudden onset of LEFT side neck pain radiating to LEFT chest for 3 weeks increased in severity this morning, hypertension, former smoker EXAM: CHEST - 2 VIEW COMPARISON:  04/05/2017 FINDINGS: Borderline enlargement of cardiac silhouette. Atherosclerotic calcification aorta. Mediastinal contours and pulmonary vascularity normal. Lungs clear. No acute infiltrate, pleural effusion or pneumothorax. Bones unremarkable. IMPRESSION: No acute abnormalities. Electronically Signed   By: Lavonia Dana M.D.   On: 04/18/2017 17:13    Pending Labs Unresulted Labs (From admission, onward)   Start     Ordered   04/18/17 1855  Troponin I (q 6hr x 3)  Now then every 6 hours,   R     04/18/17 1854   04/18/17 1855  Magnesium  Add-on,   R     04/18/17 1854   04/18/17 1855  Phosphorus  Add-on,   R     04/18/17 1854   Signed and Held  Hemoglobin A1c  Tomorrow morning,   R    Comments:  To assess prior glycemic control    Signed and Held      Vitals/Pain Today's Vitals   04/18/17 1741 04/18/17 1800 04/18/17 1830 04/18/17 1900  BP: 129/84 136/79 126/74 139/75  Pulse: 77 84 95 95  Resp: _0 Temp:      TempSrc:      SpO2: 100% 99% 96% 100%  Weight: 156 lb 1.3 oz (70.8 kg)     Height: 5' (1.524 m)       Isolation Precautions No active isolations  Medications Medications  aspirin chewable tablet 324 mg (324 mg Oral Given 04/18/17 1840)  potassium chloride SA (K-DUR,KLOR-CON) CR tablet 40 mEq (40 mEq Oral Given 04/18/17 1908)    Mobility walks

## 2017-04-19 ENCOUNTER — Other Ambulatory Visit (HOSPITAL_COMMUNITY): Payer: Medicare Other

## 2017-04-19 ENCOUNTER — Other Ambulatory Visit: Payer: Self-pay

## 2017-04-19 ENCOUNTER — Observation Stay (HOSPITAL_BASED_OUTPATIENT_CLINIC_OR_DEPARTMENT_OTHER): Payer: Medicare Other

## 2017-04-19 DIAGNOSIS — R7303 Prediabetes: Secondary | ICD-10-CM | POA: Diagnosis not present

## 2017-04-19 DIAGNOSIS — R072 Precordial pain: Secondary | ICD-10-CM

## 2017-04-19 DIAGNOSIS — J449 Chronic obstructive pulmonary disease, unspecified: Secondary | ICD-10-CM | POA: Diagnosis not present

## 2017-04-19 DIAGNOSIS — R079 Chest pain, unspecified: Secondary | ICD-10-CM | POA: Diagnosis not present

## 2017-04-19 DIAGNOSIS — R0789 Other chest pain: Secondary | ICD-10-CM | POA: Diagnosis not present

## 2017-04-19 DIAGNOSIS — E785 Hyperlipidemia, unspecified: Secondary | ICD-10-CM | POA: Diagnosis not present

## 2017-04-19 DIAGNOSIS — I10 Essential (primary) hypertension: Secondary | ICD-10-CM | POA: Diagnosis not present

## 2017-04-19 LAB — GLUCOSE, CAPILLARY
GLUCOSE-CAPILLARY: 164 mg/dL — AB (ref 65–99)
GLUCOSE-CAPILLARY: 123 mg/dL — AB (ref 65–99)
Glucose-Capillary: 115 mg/dL — ABNORMAL HIGH (ref 65–99)
Glucose-Capillary: 103 mg/dL — ABNORMAL HIGH (ref 65–99)

## 2017-04-19 LAB — ECHOCARDIOGRAM COMPLETE
HEIGHTINCHES: 65 in
Weight: 2473.6 oz

## 2017-04-19 LAB — TROPONIN I

## 2017-04-19 LAB — HEMOGLOBIN A1C
Hgb A1c MFr Bld: 5.5 % (ref 4.8–5.6)
MEAN PLASMA GLUCOSE: 111.15 mg/dL

## 2017-04-19 MED ORDER — HYDROCHLOROTHIAZIDE 25 MG PO TABS
25.0000 mg | ORAL_TABLET | Freq: Every day | ORAL | Status: DC
  Administered 2017-04-19 – 2017-04-20 (×2): 25 mg via ORAL
  Filled 2017-04-19 (×2): qty 1

## 2017-04-19 MED ORDER — LORATADINE 10 MG PO TABS: 10.0000 mg | ORAL_TABLET | Freq: Every day | ORAL | Status: DC

## 2017-04-19 MED ORDER — GABAPENTIN 300 MG PO CAPS: 300.0000 mg | ORAL_CAPSULE | Freq: Every day | ORAL | Status: DC

## 2017-04-19 NOTE — Progress Notes (Signed)
Received call from nuclear med regarding new order placed today. Patient had already eaten breakfast and was told that patient will have to have the test done tomorrow because of this.  New orders placed for NPO after midnight. Patient informed.

## 2017-04-19 NOTE — Progress Notes (Signed)
Called nuclear medicine over at [facility] and  was informed that patient is to be at [facility] at 0900 for her test.  [facility] called and transportation arranged for pickup ~0830.  Will pass along to oncoming shift.

## 2017-04-19 NOTE — Progress Notes (Signed)
PROGRESS NOTE    Kristina Ayala  GUR:[id number] DOB: 1945-09-02 DOA: 04/18/2017 PCP: [name], MD   Brief Narrative:   72 year old with past medical history relevant for hypertension, prediabetes, hyperlipidemia, COPD unknown Gold stage, chronic back pain who was admitted for atypical chest pain.   Assessment & Plan:   Active Problems:   Chest pain   COPD (chronic obstructive pulmonary disease) (HCC)   Hyperlipidemia   Essential hypertension, benign   Prediabetes   Hypokalemia   #) Atypical chest pain: -Telemetry -Cardiology consult, likely stress tomorrow, n.p.o. at midnight - Cycle cardiac biomarkers -Continue aspirin 81 mg  #) Hypertension: - Continue amlodipine 5 mg -Continue HCTZ 25 mg every morning -Continue lisinopril 40 mg daily  #) Hyperlipidemia: -Follow-up lipid panel -Continue atorvastatin 40 mg  #) Chronic pain/psych: -Continue as needed alprazolam 0.5 mg nightly -Continue gabapentin 300 mg nightly  Fluids: Tolerating p.o. Electrodes: Monitor and supplement Nutrition: Heart healthy diet, n.p.o. at midnight  Prophylaxis: Enoxaparin  Disposition: Pending completion of stress test  Full code  Consultants:   Cardiology  Procedures: (Don't include imaging studies which can be auto populated. Include things that cannot be auto populated i.e. Echo, Carotid and venous dopplers, Foley, Bipap, HD, tubes/drains, wound vac, central lines etc)  Pending stress  Antimicrobials: (specify start and planned stop date. Auto populated tables are space occupying and do not give end dates)  None   Subjective: Patient reports that her chest pain is resolved.  She reports some anxiety and significant amounts of stressors at home.  She denies any orthopnea, lower extremity edema, nausea, vomiting, diarrhea.  Objective: Vitals:   04/18/17 2000 04/18/17 2044 04/19/17 0549 04/19/17 0900  BP: 130/82 133/72 (!) 143/81 140/71  Pulse: 86 83 75 81  Resp:  14 14 20 15   Temp:  98.2 F (36.8 C) 98.2 F (36.8 C) 98.7 F (37.1 C)  TempSrc:  Oral Oral Oral  SpO2: 100% 100% 100% 98%  Weight:  70.1 kg (154 lb 9.6 oz)    Height:  5\' 5"  (1.651 m)      Intake/Output Summary (Last 24 hours) at 04/19/2017 1211 Last data filed at 04/19/2017 0817 Gross per 24 hour  Intake 120 ml  Output -  Net 120 ml   Filed Weights   04/18/17 1741 04/18/17 2044  Weight: 70.8 kg (156 lb 1.3 oz) 70.1 kg (154 lb 9.6 oz)    Examination:  General exam: Appears calm and comfortable  Respiratory system: Clear to auscultation. Respiratory effort normal. Cardiovascular system: Regular rate and rhythm, no murmurs. Gastrointestinal system: Abdomen is nondistended, soft and nontender. No organomegaly or masses felt. Normal bowel sounds heard. Central nervous system: Alert and oriented. No focal neurological deficits. Extremities: No lower extremity edema Skin: Numerous lesions over back Psychiatry: Judgement and insight appear normal. Mood & affect appropriate.     Data Reviewed: I have personally reviewed following labs and imaging studies  CBC: Recent Labs  Lab 04/18/17 1431  WBC 7.4  HGB 13.1  HCT 40.4  MCV 83.5  PLT 283   Basic Metabolic Panel: Recent Labs  Lab 04/18/17 1431 04/18/17 1913  NA 142  --   K 3.5  --   CL 102  --   CO2 31  --   GLUCOSE 98  --   BUN 18  --   CREATININE 0.77  --   CALCIUM 9.3  --   MG  --  2.0  PHOS  --  2.8  GFR: Estimated Creatinine Clearance: 61.5 mL/min (by C-G formula based on SCr of 0.77 mg/dL). Liver Function Tests: No results for input(s): AST, ALT, ALKPHOS, BILITOT, PROT, ALBUMIN in the last 168 hours. No results for input(s): LIPASE, AMYLASE in the last 168 hours. No results for input(s): AMMONIA in the last 168 hours. Coagulation Profile: No results for input(s): INR, PROTIME in the last 168 hours. Cardiac Enzymes: Recent Labs  Lab 04/18/17 1913 04/19/17 0037 04/19/17 0738  TROPONINI <0.03  <0.03 <0.03   BNP (last 3 results) No results for input(s): PROBNP in the last 8760 hours. HbA1C: Recent Labs    04/19/17 0738  HGBA1C 5.5   CBG: Recent Labs  Lab 04/18/17 2047 04/19/17 0802 04/19/17 1142  GLUCAP 107* 164* 115*   Lipid Profile: No results for input(s): CHOL, HDL, LDLCALC, TRIG, CHOLHDL, LDLDIRECT in the last 72 hours. Thyroid Function Tests: No results for input(s): TSH, T4TOTAL, FREET4, T3FREE, THYROIDAB in the last 72 hours. Anemia Panel: No results for input(s): VITAMINB12, FOLATE, FERRITIN, TIBC, IRON, RETICCTPCT in the last 72 hours. Sepsis Labs: No results for input(s): PROCALCITON, LATICACIDVEN in the last 168 hours.  No results found for this or any previous visit (from the past 240 hour(s)).       Radiology Studies: Dg Chest 2 View  Result Date: 04/18/2017 CLINICAL DATA:  Sudden onset of LEFT side neck pain radiating to LEFT chest for 3 weeks increased in severity this morning, hypertension, former smoker EXAM: CHEST - 2 VIEW COMPARISON:  04/05/2017 FINDINGS: Borderline enlargement of cardiac silhouette. Atherosclerotic calcification aorta. Mediastinal contours and pulmonary vascularity normal. Lungs clear. No acute infiltrate, pleural effusion or pneumothorax. Bones unremarkable. IMPRESSION: No acute abnormalities. Electronically Signed   By: Lavonia Dana M.D.   On: 04/18/2017 17:13        Scheduled Meds: . amLODipine  5 mg Oral Daily  . aspirin EC  81 mg Oral Daily  . atorvastatin  40 mg Oral q1800  . enoxaparin (LOVENOX) injection  40 mg Subcutaneous Q24H  . feeding supplement (ENSURE ENLIVE)  237 mL Oral BID BM  . insulin aspart  0-5 Units Subcutaneous QHS  . insulin aspart  0-9 Units Subcutaneous TID WC  . lisinopril  40 mg Oral Daily   Continuous Infusions:   LOS: 0 days    Time spent: [place], MD [facility]   If 7PM-7AM, please contact night-coverage [email or web address] Password [facility] 04/19/2017, 12:11  PM

## 2017-04-19 NOTE — Consult Note (Signed)
CARDIOLOGY CONSULT NOTE       Patient ID: Kristina Ayala MRN: [id number] DOB/AGE: May 18, 1945 72 y.o.  Admit date: 04/18/2017 Referring Physician: [place] Primary Physician: [name], MD Primary Cardiologist: New Reason for Consultation: Chest Pain  Active Problems:   Chest pain   COPD (chronic obstructive pulmonary disease) ([place])   Hyperlipidemia   Essential hypertension, benign   Prediabetes   Hypokalemia   HPI:  72 y.o. HTN, prediabetes, HLD. Admitted with atypical chest pain. No documented history of CAD Pain sharp radiating to left arm. Acute onset. Associated with some dyspnea. Resolved spontaneously after 20 minutes. Intermittent pains last 2 weeks She is pain free this am. ECG non acute R/O CXR NAD but did show atherosclerotic calcification of aorta. Compliant with meds. Non smoker   ROS All other systems reviewed and negative except as noted above  Past Medical History:  Diagnosis Date  . Arthritis   . Bursitis    left elbow  . Clostridium difficile infection   . Frequent falls   . HLD (hyperlipidemia)   . Hypertension   . Infectious colitis     Family History  Problem Relation Age of Onset  . Diverticulosis Mother   . Ovarian cancer Mother   . Pancreatic cancer Father   . Lung cancer Father   . Colon polyps Sister   . Colon cancer Neg Hx     Social History   Socioeconomic History  . Marital status: Divorced    Spouse name: Not on file  . Number of children: 2  . Years of education: Not on file  . Highest education level: Not on file  Occupational History  . Occupation: RETIRED  Social Needs  . Financial resource strain: Not on file  . Food insecurity:    Worry: Not on file    Inability: Not on file  . Transportation needs:    Medical: Not on file    Non-medical: Not on file  Tobacco Use  . Smoking status: Former Smoker    Packs/day: 0.25    Years: 50.00    Pack years: 12.50    Types: Cigarettes    Last attempt to quit:  08/28/2014    Years since quitting: 2.6  . Smokeless tobacco: Never Used  . Tobacco comment: 1 pack every 3 days  Substance and Sexual Activity  . Alcohol use: No    Alcohol/week: 0.0 oz  . Drug use: No  . Sexual activity: Not on file  Lifestyle  . Physical activity:    Days per week: Not on file    Minutes per session: Not on file  . Stress: Not on file  Relationships  . Social connections:    Talks on phone: Not on file    Gets together: Not on file    Attends religious service: Not on file    Active member of club or organization: Not on file    Attends meetings of clubs or organizations: Not on file    Relationship status: Not on file  . Intimate partner violence:    Fear of current or ex partner: Not on file    Emotionally abused: Not on file    Physically abused: Not on file    Forced sexual activity: Not on file  Other Topics Concern  . Not on file  Social History Narrative   Grandson (65 y/o) lives with patient.     Son lives in [place]   Daughter lives in [place]   She  is originally from [place], [place]   Divorced             Past Surgical History:  Procedure Laterality Date  . COLONOSCOPY    . ORIF PROXIMAL TIBIAL PLATEAU FRACTURE Left 02/19/2012  . POLYPECTOMY    . TONSILLECTOMY       . amLODipine  5 mg Oral Daily  . aspirin EC  81 mg Oral Daily  . atorvastatin  40 mg Oral q1800  . enoxaparin (LOVENOX) injection  40 mg Subcutaneous Q24H  . feeding supplement (ENSURE ENLIVE)  237 mL Oral BID BM  . insulin aspart  0-5 Units Subcutaneous QHS  . insulin aspart  0-9 Units Subcutaneous TID WC  . lisinopril  40 mg Oral Daily     Physical Exam:  BP 140/71 (BP Location: Left Arm)   Pulse 81   Temp 98.7 F (37.1 C) (Oral)   Resp 15   Ht 5\' 5"  (1.651 m)   Wt 154 lb 9.6 oz (70.1 kg)   SpO2 98%   BMI 25.73 kg/m    Affect appropriate Obese black female  HEENT: normal Neck supple with no adenopathy JVP normal no bruits no thyromegaly Lungs clear  with no wheezing and good diaphragmatic motion Heart:  S1/S2 no murmur, no rub, gallop or click PMI normal Abdomen: benighn, BS positve, no tenderness, no AAA no bruit.  No HSM or HJR Distal pulses intact with no bruits No edema Neuro non-focal Skin warm and dry No muscular weakness   Labs:   Lab Results  Component Value Date   WBC 7.4 04/18/2017   HGB 13.1 04/18/2017   HCT 40.4 04/18/2017   MCV 83.5 04/18/2017   PLT 315 04/18/2017    Recent Labs  Lab 04/18/17 1431  NA 142  K 3.5  CL 102  CO2 31  BUN 18  CREATININE 0.77  CALCIUM 9.3  GLUCOSE 98   Lab Results  Component Value Date   TROPONINI <0.03 04/19/2017    Lab Results  Component Value Date   CHOL 171 03/03/2016   CHOL 139 10/02/2013   CHOL 162 06/28/2013   Lab Results  Component Value Date   HDL 52.10 03/03/2016   HDL 44 10/02/2013   HDL 42 06/28/2013   Lab Results  Component Value Date   LDLCALC 99 03/03/2016   LDLCALC 58 10/02/2013   LDLCALC 93 06/28/2013   Lab Results  Component Value Date   TRIG 100.0 03/03/2016   TRIG 187 (H) 10/02/2013   TRIG 136 06/28/2013   Lab Results  Component Value Date   CHOLHDL 3 03/03/2016   CHOLHDL 3.2 10/02/2013   CHOLHDL 3.9 06/28/2013   Lab Results  Component Value Date   LDLDIRECT 213.1 05/22/2012      Radiology: Dg Chest 2 View  Result Date: 04/18/2017 CLINICAL DATA:  Sudden onset of LEFT side neck pain radiating to LEFT chest for 3 weeks increased in severity this morning, hypertension, former smoker EXAM: CHEST - 2 VIEW COMPARISON:  04/05/2017 FINDINGS: Borderline enlargement of cardiac silhouette. Atherosclerotic calcification aorta. Mediastinal contours and pulmonary vascularity normal. Lungs clear. No acute infiltrate, pleural effusion or pneumothorax. Bones unremarkable. IMPRESSION: No acute abnormalities. Electronically Signed   By: Lavonia Dana M.D.   On: 04/18/2017 17:13   Dg Ribs Unilateral W/chest Left  Result Date: 04/05/2017 CLINICAL  DATA:  Chest wall contusion left-sided.  Left chest pain EXAM: LEFT RIBS AND CHEST - 3+ VIEW COMPARISON:  01/11/2017 FINDINGS: Heart size upper normal.  Negative for heart failure. Lungs are clear without infiltrate or effusion. No pneumothorax Negative for left rib fracture. IMPRESSION: Negative. Electronically Signed   By: Franchot Gallo M.D.   On: 04/05/2017 14:07    EKG: SR rate 75 normal   ASSESSMENT AND PLAN:   Chest Pain: atypical r/o CRF; HTN, HLD, DM with calcific aortic atherosclerosis on CXR R/O  ECG non acute have ordered myovue for today  HTN:  Well controlled.  Continue current medications and low sodium Dash type diet.    HLD:  Continue statin LDL under 100  DM:  Discussed low carb diet.  Target hemoglobin A1c is 6.5 or less.  Continue current medications.   SignedJenkins Rouge 04/19/2017, 10:06 AM

## 2017-04-19 NOTE — Care Management Obs Status (Signed)
[place] NOTIFICATION   Patient Details  Name: TEYLOR WOLVEN MRN: [id number] Date of Birth: 29-Jul-1945   Medicare Observation Status Notification Given:  Yes    Dessa Phi, RN 04/19/2017, 4:28 PM

## 2017-04-19 NOTE — Progress Notes (Signed)
Brief Nutrition Note:   Patient is a 72 yo female admitted with chest pain. PMH HTN, prediabetes, chronic back pain, arthritis, COPD    Patient identified using the Malnutrition Screening Tool (MST).   The patient reports that loss some weight PTA  In October due to busy schedule with daughter moving into her house with grandchildren. She has been very active keeping up with her own obligations, volunteering, and driving and cooking for children/grandchildren.   The patient reports a good appetite PTA. The patient endorses having a "really good" appetite in the hospital, consuming 100% of meals. Pt was hungry at time of nutrition assessment interview.  Diet Order Diet Order:  Diet Heart Room service appropriate? Yes; Fluid consistency: Thin Diet NPO time specified (for stress test)   NFPE found no muscle or subcutaneous fat depletions.   Weight history from the medical record indicates a 4% wt loss over 3 months with stable weight in the past month.  Wt Readings from Last 15 Encounters:  04/18/17 154 lb 9.6 oz (70.1 kg)  04/18/17 156 lb 1.3 oz (70.8 kg)  04/05/17 156 lb 9.6 oz (71 kg)  03/25/17 155 lb (70.3 kg)  03/17/17 155 lb 9.6 oz (70.6 kg)  03/09/17 154 lb 12.8 oz (70.2 kg)  01/21/17 159 lb 9.6 oz (72.4 kg)  01/11/17 159 lb (72.1 kg)  01/06/17 157 lb (71.2 kg)  12/28/16 157 lb (71.2 kg)  12/01/16 160 lb (72.6 kg)  12/01/16 161 lb (73 kg)  10/25/16 165 lb (74.8 kg)  09/16/16 167 lb (75.8 kg)  09/16/16 167 lb (75.8 kg)     BMI:  Body mass index is 25.73 kg/m. The patient is classified as Overweight 25.0-29.9 kg/m2    No further nutrition interventions warranted at this time. Please consult RD if future nutrition needs arise.     Edmonia Lynch  Dietetic Intern  [phone]

## 2017-04-19 NOTE — Progress Notes (Signed)
*  PRELIMINARY RESULTS* Echocardiogram 2D Echocardiogram has been performed.  Kristina Ayala 04/19/2017, 2:37 PM

## 2017-04-20 ENCOUNTER — Ambulatory Visit (HOSPITAL_COMMUNITY)
Admit: 2017-04-20 | Discharge: 2017-04-20 | Disposition: A | Discharge status: Home / Self Care | Payer: Medicare Other | Attending: Cardiovascular Disease | Admitting: Cardiovascular Disease

## 2017-04-20 ENCOUNTER — Ambulatory Visit: Payer: Medicare Other | Admitting: Family Medicine

## 2017-04-20 DIAGNOSIS — R079 Chest pain, unspecified: Secondary | ICD-10-CM

## 2017-04-20 DIAGNOSIS — R002 Palpitations: Secondary | ICD-10-CM | POA: Insufficient documentation

## 2017-04-20 DIAGNOSIS — R0789 Other chest pain: Secondary | ICD-10-CM

## 2017-04-20 LAB — NM MYOCAR MULTI W/SPECT W/WALL MOTION / EF
Exercise duration (min): 4 min
Rest HR: 78 {beats}/min

## 2017-04-20 LAB — GLUCOSE, CAPILLARY
GLUCOSE-CAPILLARY: 85 mg/dL (ref 65–99)
Glucose-Capillary: 94 mg/dL (ref 65–99)

## 2017-04-20 MED ORDER — TECHNETIUM TC 99M TETROFOSMIN IV KIT
10.0000 | PACK | Freq: Once | INTRAVENOUS | Status: AC | PRN
  Administered 2017-04-20: 10 via INTRAVENOUS

## 2017-04-20 MED ORDER — REGADENOSON 0.4 MG/5ML IV SOLN
INTRAVENOUS | Status: AC
  Filled 2017-04-20: qty 5

## 2017-04-20 MED ORDER — REGADENOSON 0.4 MG/5ML IV SOLN
0.4000 mg | Freq: Once | INTRAVENOUS | Status: AC
  Administered 2017-04-20: 0.4 mg via INTRAVENOUS

## 2017-04-20 MED ORDER — REGADENOSON 0.4 MG/5ML IV SOLN: 0.4000 mg | Freq: Once | INTRAVENOUS | Status: DC

## 2017-04-20 MED ORDER — TECHNETIUM TC 99M TETROFOSMIN IV KIT
30.0000 | PACK | Freq: Once | INTRAVENOUS | Status: AC | PRN
  Administered 2017-04-20: 30 via INTRAVENOUS

## 2017-04-20 NOTE — Progress Notes (Signed)
Subjective:  Denies SSCP, palpitations or Dyspnea Seen in nuclear medicine at [facility]   Objective:  Vitals:   04/19/17 0900 04/19/17 1300 04/19/17 1952 04/20/17 0600  BP: 140/71 119/67 137/76 (!) 141/90  Pulse: 81 79 89 72  Resp: 15 15 16 14   Temp: 98.7 F (37.1 C) 97.6 F (36.4 C) 98.2 F (36.8 C) 97.6 F (36.4 C)  TempSrc: Oral Oral Oral Oral  SpO2: 98% 100% 98% 100%  Weight:      Height:        Intake/Output from previous day:  Intake/Output Summary (Last 24 hours) at 04/20/2017 0911 Last data filed at 04/19/2017 2200 Gross per 24 hour  Intake 360 ml  Output -  Net 360 ml    Physical Exam: Affect appropriate Pleasant overweight black female  HEENT: normal Neck supple with no adenopathy JVP normal no bruits no thyromegaly Lungs clear with no wheezing and good diaphragmatic motion Heart:  S1/S2 no murmur, no rub, gallop or click PMI normal Abdomen: benighn, BS positve, no tenderness, no AAA no bruit.  No HSM or HJR Distal pulses intact with no bruits No edema Neuro non-focal Skin warm and dry No muscular weakness   Lab Results: Basic Metabolic Panel: Recent Labs    04/18/17 1431 04/18/17 1913  NA 142  --   K 3.5  --   CL 102  --   CO2 31  --   GLUCOSE 98  --   BUN 18  --   CREATININE 0.77  --   CALCIUM 9.3  --   MG  --  2.0  PHOS  --  2.8   Liver Function Tests: No results for input(s): AST, ALT, ALKPHOS, BILITOT, PROT, ALBUMIN in the last 72 hours. No results for input(s): LIPASE, AMYLASE in the last 72 hours. CBC: Recent Labs    04/18/17 1431  WBC 7.4  HGB 13.1  HCT 40.4  MCV 83.5  PLT 315   Cardiac Enzymes: Recent Labs    04/18/17 1913 04/19/17 0037 04/19/17 0738  TROPONINI <0.03 <0.03 <0.03   BNP: Invalid input(s): POCBNP D-Dimer: No results for input(s): DDIMER in the last 72 hours. Hemoglobin A1C: Recent Labs    04/19/17 0738  HGBA1C 5.5    Imaging: Dg Chest 2 View  Result Date: 04/18/2017 CLINICAL DATA:   Sudden onset of LEFT side neck pain radiating to LEFT chest for 3 weeks increased in severity this morning, hypertension, former smoker EXAM: CHEST - 2 VIEW COMPARISON:  04/05/2017 FINDINGS: Borderline enlargement of cardiac silhouette. Atherosclerotic calcification aorta. Mediastinal contours and pulmonary vascularity normal. Lungs clear. No acute infiltrate, pleural effusion or pneumothorax. Bones unremarkable. IMPRESSION: No acute abnormalities. Electronically Signed   By: Lavonia Dana M.D.   On: 04/18/2017 17:13    Cardiac Studies:  ECG: NSR no acute ST changes normal   Telemetry:  NSR no arrhythmia 04/20/2017   Echo: 04/19/17 EF 55-60% no significant valve disease reviewed   Medications:   . amLODipine  5 mg Oral Daily  . aspirin EC  81 mg Oral Daily  . atorvastatin  40 mg Oral q1800  . enoxaparin (LOVENOX) injection  40 mg Subcutaneous Q24H  . feeding supplement (ENSURE ENLIVE)  237 mL Oral BID BM  . gabapentin  300 mg Oral QHS  . hydrochlorothiazide  25 mg Oral Daily  . insulin aspart  0-5 Units Subcutaneous QHS  . insulin aspart  0-9 Units Subcutaneous TID WC  . lisinopril  40 mg Oral  Daily      Assessment/Plan:  Chest Pain: r/o echo with no RWMA;s ECG normal for myovue today d/c home latter today If normal  HTN:  Well controlled.  Continue current medications and low sodium Dash type diet.    HLD:  Continue statin   DM:  Discussed low carb diet.  Target hemoglobin A1c is 6.5 or less.  Continue current medications.   Jenkins Rouge 04/20/2017, 9:11 AM

## 2017-04-20 NOTE — Discharge Summary (Signed)
Physician Discharge Summary  Kristina Ayala GQQ:[id number] DOB: 1945/10/17 DOA: 04/18/2017  PCP: [name], MD  Admit date: 04/18/2017 Discharge date: 04/20/2017  Admitted From: Home Disposition:  Home   Recommendations for Outpatient Follow-up:  1. Follow up with PCP in 1 week  Discharge Condition: Stable CODE STATUS: Full Diet recommendation: Heart healthy  Brief/Interim Summary: Kristina Ayala is a 72 yo female with medical history significant of hypertension, prediabetes, chronic back pain, arthritis, history of C. difficile, HLD, COPD, HTN who presents with 2-week history of chest pain.  She states that 2 weeks ago, she was trying to move a king-size mattress, fell and since then has had dull chest pain.  Chest pain has been ongoing intermittently.  During her hospitalization, troponin x4 was negative, she underwent echocardiogram which did not reveal wall motion abnormalities.  Cardiology was consulted.  She underwent stress test on 3/27 which was low risk.  On morning of discharge, patient was not complaining of any chest discomfort.  Discharge Diagnoses:  Principal Problem:   Atypical chest pain Active Problems:   COPD (chronic obstructive pulmonary disease) (HCC)   Hyperlipidemia   Essential hypertension, benign   Prediabetes   Hypokalemia   Discharge Instructions  Discharge Instructions    Call MD for:  difficulty breathing, headache or visual disturbances   Complete by:  As directed    Call MD for:  extreme fatigue   Complete by:  As directed    Call MD for:  hives   Complete by:  As directed    Call MD for:  persistant dizziness or light-headedness   Complete by:  As directed    Call MD for:  persistant nausea and vomiting   Complete by:  As directed    Call MD for:  severe uncontrolled pain   Complete by:  As directed    Diet - low sodium heart healthy   Complete by:  As directed    Discharge instructions   Complete by:  As directed    You  were cared for by a hospitalist during your hospital stay. If you have any questions about your discharge medications or the care you received while you were in the hospital after you are discharged, you can call the unit and asked to speak with the hospitalist on call if the hospitalist that took care of you is not available. Once you are discharged, your primary care physician will handle any further medical issues. Please note that NO REFILLS for any discharge medications will be authorized once you are discharged, as it is imperative that you return to your primary care physician (or establish a relationship with a primary care physician if you do not have one) for your aftercare needs so that they can reassess your need for medications and monitor your lab values.   Increase activity slowly   Complete by:  As directed      Allergies as of 04/20/2017      Reactions   Doxycycline Diarrhea   Minocycline Diarrhea   Wellbutrin [bupropion] Swelling   Per pt her tongue was swollen and sore/symptoms stopped once the medication was discontinued.    Penicillins Rash   Has patient had a PCN reaction causing immediate rash, facial/tongue/throat swelling, SOB or lightheadedness with hypotension: yes Has patient had a PCN reaction causing severe rash involving mucus membranes or skin necrosis: no Has patient had a PCN reaction that required hospitalization: unknown Has patient had a PCN reaction occurring within the  last 10 years: no If all of the above answers are "NO", then may proceed with Cephalosporin use.      Medication List    TAKE these medications   acetaminophen 500 MG tablet Commonly known as:  TYLENOL Take 1,000 mg by mouth every 6 (six) hours as needed (For burn pain.).   acetaminophen-codeine 120-12 MG/5ML solution Take 5 mLs by mouth at bedtime as needed (cough suppression).   ALPRAZolam 0.5 MG tablet Commonly known as:  XANAX Take 1 tablet (0.5 mg total) by mouth at bedtime as  needed for anxiety. -- Office visit needed for further refills   amLODipine 5 MG tablet Commonly known as:  NORVASC TAKE 1 TABLET(5 MG) BY MOUTH DAILY   aspirin EC 81 MG tablet Take 81 mg by mouth daily.   atorvastatin 40 MG tablet Commonly known as:  LIPITOR TAKE 1 TABLET(40 MG) BY MOUTH EVERY MORNING   cetirizine 10 MG tablet Commonly known as:  ZYRTEC Take 1 tablet (10 mg total) by mouth daily.   ferrous sulfate 325 (65 FE) MG tablet Take 1 tablet (325 mg total) by mouth daily with breakfast.   gabapentin 300 MG capsule Commonly known as:  NEURONTIN Take 1 capsule (300 mg total) by mouth at bedtime.   hydrochlorothiazide 25 MG tablet Commonly known as:  HYDRODIURIL TAKE 1 TABLET(25 MG) BY MOUTH EVERY MORNING   lisinopril 40 MG tablet Commonly known as:  PRINIVIL,ZESTRIL TAKE 1 TABLET(40 MG) BY MOUTH EVERY MORNING   Magnesium 100 MG Caps Take 100 mg by mouth daily.   meloxicam 7.5 MG tablet Commonly known as:  MOBIC Take 1 tablet (7.5 mg total) by mouth daily.   methocarbamol 500 MG tablet Commonly known as:  ROBAXIN Take 1 tablet (500 mg total) by mouth every 8 (eight) hours as needed for muscle spasms.   potassium chloride 8 MEQ tablet Commonly known as:  KLOR-CON Take 2 tablets (16 mEq total) daily by mouth.   PROLIA 60 MG/ML Soln injection Generic drug:  denosumab Inject 60 mg as directed every 6 (six) months.   pseudoephedrine 60 MG tablet Commonly known as:  SUDAFED Take 1 tablet (60 mg total) by mouth every 8 (eight) hours as needed.   TURMERIC PO Take 1 tablet by mouth daily.   Vitamin B-12 2500 MCG Subl Place 1 tablet under the tongue daily.      Follow-up Information    [name], MD. Schedule an appointment as soon as possible for a visit in 1 week(s).   Specialty:  Internal Medicine Contact information: [place] [address] [phone]          Allergies  Allergen Reactions  . Doxycycline Diarrhea  .  Minocycline Diarrhea  . Wellbutrin [Bupropion] Swelling    Per pt her tongue was swollen and sore/symptoms stopped once the medication was discontinued.   [name] Penicillins Rash    Has patient had a PCN reaction causing immediate rash, facial/tongue/throat swelling, SOB or lightheadedness with hypotension: yes Has patient had a PCN reaction causing severe rash involving mucus membranes or skin necrosis: no Has patient had a PCN reaction that required hospitalization: unknown Has patient had a PCN reaction occurring within the last 10 years: no If all of the above answers are "NO", then may proceed with Cephalosporin use.     Consultations:  Cardiology    Procedures/Studies: Dg Chest 2 View  Result Date: 04/18/2017 CLINICAL DATA:  Sudden onset of LEFT side neck pain radiating  to LEFT chest for 3 weeks increased in severity this morning, hypertension, former smoker EXAM: CHEST - 2 VIEW COMPARISON:  04/05/2017 FINDINGS: Borderline enlargement of cardiac silhouette. Atherosclerotic calcification aorta. Mediastinal contours and pulmonary vascularity normal. Lungs clear. No acute infiltrate, pleural effusion or pneumothorax. Bones unremarkable. IMPRESSION: No acute abnormalities. Electronically Signed   By: Lavonia Dana M.D.   On: 04/18/2017 17:13   Dg Ribs Unilateral W/chest Left  Result Date: 04/05/2017 CLINICAL DATA:  Chest wall contusion left-sided.  Left chest pain EXAM: LEFT RIBS AND CHEST - 3+ VIEW COMPARISON:  01/11/2017 FINDINGS: Heart size upper normal. Negative for heart failure. Lungs are clear without infiltrate or effusion. No pneumothorax Negative for left rib fracture. IMPRESSION: Negative. Electronically Signed   By: Franchot Gallo M.D.   On: 04/05/2017 14:07   Nm Myocar Multi W/spect W/wall Motion / Ef  Result Date: 04/20/2017  There was no ST segment deviation noted during stress.  No T wave inversion was noted during stress.  There is a small defect of mild severity present in  the mid inferolateral, mid anterolateral and apical lateral location. The defect is non-reversible and consistent with breast attenuation artifact.  There is a small defect of mild severity present in the apex location. The defect is reversible but likely represents shifting breast attenuation artifact vs a very small area of ischemia.  This is a low risk study.  The left ventricular ejection fraction is hyperdynamic (>65%).  Nuclear stress EF: 72%.     Echo  Study Conclusions  - Left ventricle: The cavity size was normal. Wall thickness was   normal. Systolic function was normal. The estimated ejection   fraction was in the range of 55% to 60%. Wall motion was normal;   there were no regional wall motion abnormalities. Doppler   parameters are consistent with abnormal left ventricular   relaxation (grade 1 diastolic dysfunction).  Impressions:  - Normal LV function; mild diastolic dysfunction; prominent apical trabeculae.   Discharge Exam: Vitals:   04/20/17 1002 04/20/17 1214  BP: 137/74 131/88  Pulse:  90  Resp:  18  Temp:  (!) 97.5 F (36.4 C)  SpO2:  100%    General: Pt is alert, awake, not in acute distress Cardiovascular: RRR, S1/S2 +, no rubs, no gallops Respiratory: CTA bilaterally, no wheezing, no rhonchi Abdominal: Soft, NT, ND, bowel sounds + Extremities: no edema, no cyanosis    The results of significant diagnostics from this hospitalization (including imaging, microbiology, ancillary and laboratory) are listed below for reference.     Microbiology: No results found for this or any previous visit (from the past 240 hour(s)).   Labs: BNP (last 3 results) No results for input(s): BNP in the last 8760 hours. Basic Metabolic Panel: Recent Labs  Lab 04/18/17 1431 04/18/17 1913  NA 142  --   K 3.5  --   CL 102  --   CO2 31  --   GLUCOSE 98  --   BUN 18  --   CREATININE 0.77  --   CALCIUM 9.3  --   MG  --  2.0  PHOS  --  2.8   Liver  Function Tests: No results for input(s): AST, ALT, ALKPHOS, BILITOT, PROT, ALBUMIN in the last 168 hours. No results for input(s): LIPASE, AMYLASE in the last 168 hours. No results for input(s): AMMONIA in the last 168 hours. CBC: Recent Labs  Lab 04/18/17 1431  WBC 7.4  HGB 13.1  HCT 40.4  MCV 83.5  PLT 315   Cardiac Enzymes: Recent Labs  Lab 04/18/17 1913 04/19/17 0037 04/19/17 0738  TROPONINI <0.03 <0.03 <0.03   BNP: Invalid input(s): POCBNP CBG: Recent Labs  Lab 04/19/17 1142 04/19/17 1659 04/19/17 2122 04/20/17 0723 04/20/17 1255  GLUCAP 115* 103* 123* 94 85   D-Dimer No results for input(s): DDIMER in the last 72 hours. Hgb A1c Recent Labs    04/19/17 0738  HGBA1C 5.5   Lipid Profile No results for input(s): CHOL, HDL, LDLCALC, TRIG, CHOLHDL, LDLDIRECT in the last 72 hours. Thyroid function studies No results for input(s): TSH, T4TOTAL, T3FREE, THYROIDAB in the last 72 hours.  Invalid input(s): FREET3 Anemia work up No results for input(s): VITAMINB12, FOLATE, FERRITIN, TIBC, IRON, RETICCTPCT in the last 72 hours. Urinalysis    Component Value Date/Time   COLORURINE YELLOW 12/01/2016 1451   APPEARANCEUR CLEAR 12/01/2016 1451   LABSPEC >=1.030 (A) 12/01/2016 1451   PHURINE 5.5 12/01/2016 1451   GLUCOSEU NEGATIVE 12/01/2016 1451   HGBUR NEGATIVE 12/01/2016 1451   BILIRUBINUR NEGATIVE 12/01/2016 1451   BILIRUBINUR neg 02/19/2015 1356   KETONESUR NEGATIVE 12/01/2016 1451   PROTEINUR NEGATIVE 10/04/2015 1704   UROBILINOGEN 0.2 12/01/2016 1451   NITRITE NEGATIVE 12/01/2016 1451   LEUKOCYTESUR NEGATIVE 12/01/2016 1451   Sepsis Labs Invalid input(s): PROCALCITONIN,  WBC,  LACTICIDVEN Microbiology No results found for this or any previous visit (from the past 240 hour(s)).   Patient was seen and examined on the day of discharge and was found to be in stable condition. Time coordinating discharge: 20 minutes including assessment and coordination of  care, as well as examination of the patient.   SIGNED:  Dessa Phi, DO [facility] Pager [phone]  If 7PM-7AM, please contact night-coverage [email or web address] Password [email or web address] 04/20/2017, 3:50 PM

## 2017-04-20 NOTE — Progress Notes (Signed)
Patient transported via [facility] to [facility] for Stress Test. Patient left on telemetry, A&O, VSS. Pt to return after procedure.

## 2017-04-20 NOTE — Progress Notes (Signed)
Stress test completed

## 2017-04-21 ENCOUNTER — Ambulatory Visit: Payer: Medicare Other | Admitting: Physical Therapy

## 2017-04-21 ENCOUNTER — Telehealth: Payer: Self-pay | Admitting: *Deleted

## 2017-04-21 NOTE — Telephone Encounter (Signed)
Called pt to make TCM hosp f/u appt there was no answer LMOM RTC.Marland KitchenJohny Ayala

## 2017-04-22 NOTE — Telephone Encounter (Signed)
Called pt to confirm hosp f./u appt that made for 04/29/17. Completed TCM call below.Kristina Ayala  Transition Care Management Follow-up Telephone Call   Date discharged? 04/20/17   How have you been since you were released from the hospital? Pt states she is doing alright   Do you understand why you were in the hospital? YES   Do you understand the discharge instructions? YES   Where were you discharged to? Home   Items Reviewed:  Medications reviewed: YES  Allergies reviewed: YES  Dietary changes reviewed: YES, heart healthy  Referrals reviewed: No referral needed   Functional Questionnaire:   Activities of Daily Living (ADLs):   She states she are independent in the following: ambulation, bathing and hygiene, feeding, continence, grooming, toileting and dressing States she doesn't require assistance    Any transportation issues/concerns?: NO   Any patient concerns? NO   Confirmed importance and date/time of follow-up visits scheduled YES, appt 04/29/17  Provider Appointment booked with  Dr. Quay Burow  Confirmed with patient if condition begins to worsen call PCP or go to the ER.  Patient was given the office number and encouraged to call back with question or concerns.  : YES

## 2017-04-26 ENCOUNTER — Ambulatory Visit (INDEPENDENT_AMBULATORY_CARE_PROVIDER_SITE_OTHER): Payer: Medicare Other | Admitting: Internal Medicine

## 2017-04-26 ENCOUNTER — Encounter: Payer: Self-pay | Admitting: Internal Medicine

## 2017-04-26 VITALS — BP 132/84 | HR 90 | Temp 98.4°F | Resp 16 | Wt 155.0 lb

## 2017-04-26 DIAGNOSIS — I10 Essential (primary) hypertension: Secondary | ICD-10-CM

## 2017-04-26 DIAGNOSIS — F419 Anxiety disorder, unspecified: Secondary | ICD-10-CM | POA: Diagnosis not present

## 2017-04-26 DIAGNOSIS — R7303 Prediabetes: Secondary | ICD-10-CM

## 2017-04-26 DIAGNOSIS — R0789 Other chest pain: Secondary | ICD-10-CM | POA: Diagnosis not present

## 2017-04-26 NOTE — Progress Notes (Signed)
Subjective:    Patient ID: Kristina Ayala, female    DOB: [birth year], 72 y.o.   MRN: [id number]  HPI The patient is here for follow up from the hospital.  Admitted 04/18/17-04/20/17 for atypical chest pain.  She presented to the emergency room with 2-week history of chest pain.  Prior to the pain starting she was trying to move the king-size mattress and fell.  She was hit by the mattress.  Since that time she has had dull chest pain that has been intermittent.  The pain radiated down her chest and left arm.  The pain improved with rest.  She has had palpitations on occasion.  She did call our office and was advised to go to the emergency room, but ended up going to urgent care.  She was experiencing increased stress at home.  EKG done at [facility] urgent care showed possible atrial flutter and she was advised to go to the ED. troponin x4 was negative, echocardiogram did not reveal any wall motion abnormalities.  Cardiology was consulted.  She underwent a stress test 3/27, which was low risk.  She was not having any chest pain upon discharge home.  There were no medication changes.  Before she left the hospital the chest pain was gone.  She denies any recurrence of chest pain.  She denies palpitations, sob, leg edema and headaches.  She is taking all of her medications as prescribed.   Prediabetes:  She is compliant with a low sugar/carbohydrate diet.  She is exercising regularly.  Hypertension: She is taking her medication daily. She is compliant with a low sodium diet.  She denies chest pain, palpitations, edema, shortness of breath and regular headaches. She is exercising regularly.    Anxiety: her daughter and granddaughter are living with her and that is causing a lot of stress.  She is taking her medication daily as prescribed. She denies any side effects from the medication. She feels her anxiety is well controlled and she is happy with her current dose of medication.     Medications  and allergies reviewed with patient and updated if appropriate.  Patient Active Problem List   Diagnosis Date Noted  . Hypokalemia 04/18/2017  . Degenerative arthritis of left knee 12/01/2016  . Pilonidal cyst 12/01/2016  . Anxiety 12/01/2016  . Weight loss, non-intentional 12/01/2016  . Prediabetes 09/16/2016  . Sleep difficulties 09/16/2016  . Bursitis of right shoulder 09/04/2015  . Lateral meniscus derangement 06/03/2015  . Tear of LCL (lateral collateral ligament) of knee 06/03/2015  . Lumbar radiculopathy 09/19/2014  . Low back pain 01/04/2014  . Bilateral knee pain 01/04/2014  . Diverticulosis of colon without hemorrhage 07/26/2013  . Trigger thumb of left hand 03/06/2013  . Essential hypertension, benign 02/15/2013  . Hyperlipidemia 09/28/2012  . COPD (chronic obstructive pulmonary disease) ([place]) 05/22/2012  . Frequent falls 05/22/2012  . Osteoporosis 05/22/2012  . Atypical chest pain 11/12/2011    Current Outpatient Medications on File Prior to Visit  Medication Sig Dispense Refill  . acetaminophen (TYLENOL) 500 MG tablet Take 1,000 mg by mouth every 6 (six) hours as needed (For burn pain.).    [name] ALPRAZolam (XANAX) 0.5 MG tablet Take 1 tablet (0.5 mg total) by mouth at bedtime as needed for anxiety. -- Office visit needed for further refills 30 tablet 0  . amLODipine (NORVASC) 5 MG tablet TAKE 1 TABLET(5 MG) BY MOUTH DAILY 90 tablet 2  . aspirin EC 81 MG tablet Take 81 mg  by mouth daily.    [name] atorvastatin (LIPITOR) 40 MG tablet TAKE 1 TABLET(40 MG) BY MOUTH EVERY MORNING 90 tablet 1  . cetirizine (ZYRTEC) 10 MG tablet Take 1 tablet (10 mg total) by mouth daily. 30 tablet 11  . Cyanocobalamin (VITAMIN B-12) 2500 MCG SUBL Place 1 tablet under the tongue daily.    . ferrous sulfate 325 (65 FE) MG tablet Take 1 tablet (325 mg total) by mouth daily with breakfast. 30 tablet 3  . gabapentin (NEURONTIN) 300 MG capsule Take 1 capsule (300 mg total) by mouth at bedtime. 30  capsule 3  . hydrochlorothiazide (HYDRODIURIL) 25 MG tablet TAKE 1 TABLET(25 MG) BY MOUTH EVERY MORNING 90 tablet 2  . lisinopril (PRINIVIL,ZESTRIL) 40 MG tablet TAKE 1 TABLET(40 MG) BY MOUTH EVERY MORNING 90 tablet 2  . Magnesium 100 MG CAPS Take 100 mg by mouth daily.    . meloxicam (MOBIC) 7.5 MG tablet Take 1 tablet (7.5 mg total) by mouth daily. 30 tablet 0  . potassium chloride (KLOR-CON) 8 MEQ tablet Take 2 tablets (16 mEq total) daily by mouth. 60 tablet 3  . PROLIA 60 MG/ML SOLN injection Inject 60 mg as directed every 6 (six) months.  0  . pseudoephedrine (SUDAFED) 60 MG tablet Take 1 tablet (60 mg total) by mouth every 8 (eight) hours as needed. 30 tablet 0  . TURMERIC PO Take 1 tablet by mouth daily.     No current facility-administered medications on file prior to visit.     Past Medical History:  Diagnosis Date  . Arthritis   . Bursitis    left elbow  . Clostridium difficile infection   . Frequent falls   . HLD (hyperlipidemia)   . Hypertension   . Infectious colitis     Past Surgical History:  Procedure Laterality Date  . COLONOSCOPY    . ORIF PROXIMAL TIBIAL PLATEAU FRACTURE Left 02/19/2012  . POLYPECTOMY    . TONSILLECTOMY      Social History   Socioeconomic History  . Marital status: Divorced    Spouse name: Not on file  . Number of children: 2  . Years of education: Not on file  . Highest education level: Not on file  Occupational History  . Occupation: RETIRED  Social Needs  . Financial resource strain: Not on file  . Food insecurity:    Worry: Not on file    Inability: Not on file  . Transportation needs:    Medical: Not on file    Non-medical: Not on file  Tobacco Use  . Smoking status: Former Smoker    Packs/day: 0.25    Years: 50.00    Pack years: 12.50    Types: Cigarettes    Last attempt to quit: 08/28/2014    Years since quitting: 2.6  . Smokeless tobacco: Never Used  . Tobacco comment: 1 pack every 3 days  Substance and Sexual  Activity  . Alcohol use: No    Alcohol/week: 0.0 oz  . Drug use: No  . Sexual activity: Not on file  Lifestyle  . Physical activity:    Days per week: Not on file    Minutes per session: Not on file  . Stress: Not on file  Relationships  . Social connections:    Talks on phone: Not on file    Gets together: Not on file    Attends religious service: Not on file    Active member of club or organization: Not on file  Attends meetings of clubs or organizations: Not on file    Relationship status: Not on file  Other Topics Concern  . Not on file  Social History Narrative   Grandson (36 y/o) lives with patient.     Son lives in [place]   Daughter lives in [place]   She is originally from [place], [place]   Divorced             Family History  Problem Relation Age of Onset  . Diverticulosis Mother   . Ovarian cancer Mother   . Pancreatic cancer Father   . Lung cancer Father   . Colon polyps Sister   . Colon cancer Neg Hx     Review of Systems  Constitutional: Negative for chills and fever.  Respiratory: Negative for cough, shortness of breath and wheezing.   Cardiovascular: Negative for chest pain, palpitations and leg swelling.  Neurological: Negative for light-headedness and headaches.  Psychiatric/Behavioral: The patient is nervous/anxious (controlled).        Objective:   Vitals:   04/26/17 1301  BP: 132/84  Pulse: 90  Resp: 16  Temp: 98.4 F (36.9 C)  SpO2: 97%   BP Readings from Last 3 Encounters:  04/26/17 132/84  04/20/17 127/86  04/20/17 131/88   Wt Readings from Last 3 Encounters:  04/26/17 155 lb (70.3 kg)  04/18/17 154 lb 9.6 oz (70.1 kg)  04/18/17 156 lb 1.3 oz (70.8 kg)   Body mass index is 25.79 kg/m.   Physical Exam    Constitutional: Appears well-developed and well-nourished. No distress.  HENT:  Head: Normocephalic and atraumatic.  Neck: Neck supple. No tracheal deviation present. No thyromegaly present.  No cervical  lymphadenopathy Cardiovascular: Normal rate, regular rhythm and normal heart sounds.   No murmur heard. No carotid bruit .  No edema Pulmonary/Chest: Effort normal and breath sounds normal. No respiratory distress. No has no wheezes. No rales.  Skin: Skin is warm and dry. Not diaphoretic.  Psychiatric: Normal mood and affect. Behavior is normal.    Echo,  Study Conclusions, 04/19/17: - Left ventricle: The cavity size was normal. Wall thickness was normal. Systolic function was normal. The estimated ejection fraction was in the range of 55% to 60%. Wall motion was normal; there were no regional wall motion abnormalities. Doppler parameters are consistent with abnormal left ventricular relaxation (grade 1 diastolic dysfunction).  Impressions: - Normal LV function; mild diastolic dysfunction; prominent apical trabeculae.  CXR, 04/18/17: No acute abnormalities  NM stress test, 04/20/17:  There was no ST segment deviation noted during stress.  No T wave inversion was noted during stress.  There is a small defect of mild severity present in the mid inferolateral, mid anterolateral and apical lateral location. The defect is non-reversible and consistent with breast attenuation artifact.  There is a small defect of mild severity present in the apex location. The defect is reversible but likely represents shifting breast attenuation artifact vs a very small area of ischemia.  This is a low risk study.  The left ventricular ejection fraction is hyperdynamic (>65%).  Nuclear stress EF: 72%.   Lab Results  Component Value Date   WBC 7.4 04/18/2017   HGB 13.1 04/18/2017   HCT 40.4 04/18/2017   PLT 315 04/18/2017   GLUCOSE 98 04/18/2017   CHOL 171 03/03/2016   TRIG 100.0 03/03/2016   HDL 52.10 03/03/2016   LDLDIRECT 213.1 05/22/2012   LDLCALC 99 03/03/2016   ALT 27 01/06/2017   AST 23 01/06/2017  NA 142 04/18/2017   K 3.5 04/18/2017   CL 102 04/18/2017    CREATININE 0.77 04/18/2017   BUN 18 04/18/2017   CO2 31 04/18/2017   TSH 0.41 12/01/2016   HGBA1C 5.5 04/19/2017     Assessment & Plan:    See Problem List for Assessment and Plan of chronic medical problems.

## 2017-04-26 NOTE — Assessment & Plan Note (Signed)
Controlled, stable Continue current dose of medication - xanax prn only

## 2017-04-26 NOTE — Assessment & Plan Note (Signed)
Lab Results  Component Value Date   HGBA1C 5.5 04/19/2017    Continue low sugar and carb diet Regular exercise F/u in 6 months

## 2017-04-26 NOTE — Patient Instructions (Addendum)
  Medications reviewed and updated.  No changes recommended at this time.     Please followup in 6 months   

## 2017-04-26 NOTE — Assessment & Plan Note (Signed)
BP well controlled Current regimen effective and well tolerated Continue current medications at current doses  

## 2017-04-26 NOTE — Assessment & Plan Note (Signed)
Chest pain likely from trauma of having mattress hit her in the chest Cardiac work up negative  Continue current medications

## 2017-04-29 ENCOUNTER — Inpatient Hospital Stay: Payer: Medicare Other | Admitting: Internal Medicine

## 2017-05-03 ENCOUNTER — Other Ambulatory Visit: Payer: Self-pay | Admitting: Internal Medicine

## 2017-05-09 ENCOUNTER — Other Ambulatory Visit: Payer: Self-pay | Admitting: Internal Medicine

## 2017-05-13 ENCOUNTER — Encounter: Payer: Self-pay | Admitting: Urgent Care

## 2017-05-13 ENCOUNTER — Ambulatory Visit (INDEPENDENT_AMBULATORY_CARE_PROVIDER_SITE_OTHER): Payer: Medicare Other | Admitting: Urgent Care

## 2017-05-13 VITALS — BP 122/72 | HR 83 | Temp 98.3°F | Resp 17 | Ht 65.5 in | Wt 155.0 lb

## 2017-05-13 DIAGNOSIS — L819 Disorder of pigmentation, unspecified: Secondary | ICD-10-CM | POA: Diagnosis not present

## 2017-05-13 DIAGNOSIS — L820 Inflamed seborrheic keratosis: Secondary | ICD-10-CM

## 2017-05-13 DIAGNOSIS — L821 Other seborrheic keratosis: Secondary | ICD-10-CM

## 2017-05-13 MED ORDER — SULFAMETHOXAZOLE-TRIMETHOPRIM 800-160 MG PO TABS: 1.0000 | ORAL_TABLET | Freq: Two times a day (BID) | ORAL | 0 refills | Status: DC

## 2017-05-13 NOTE — Patient Instructions (Addendum)
Seborrheic Keratosis Seborrheic keratosis is a common, noncancerous (benign) skin growth. This condition causes waxy, rough, tan, brown, or black spots to appear on the skin. These skin growths can be flat or raised. What are the causes? The cause of this condition is not known. What increases the risk? This condition is more likely to develop in:  People who have a family history of seborrheic keratosis.  People who are 64 or older.  People who are pregnant.  People who have had estrogen replacement therapy.  What are the signs or symptoms? This condition often occurs on the face, chest, shoulders, back, or other areas. These growths:  Are usually painless, but may become irritated and itchy.  Can be yellow, brown, black, or other colors.  Are slightly raised or have a flat surface.  Are sometimes rough or wart-like in texture.  Are often waxy on the surface.  Are round or oval-shaped.  Sometimes look like they are "stuck on."  Often occur in groups, but may occur as a single growth.  How is this diagnosed? This condition is diagnosed with a medical history and physical exam. A sample of the growth may be tested (skin biopsy). You may need to see a skin specialist (dermatologist). How is this treated? Treatment is not usually needed for this condition, unless the growths are irritated or are often bleeding. You may also choose to have the growths removed if you do not like their appearance. Most commonly, these growths are treated with a procedure in which liquid nitrogen is applied to "freeze" off the growth (cryosurgery). They may also be burned off with electricity or cut off. Follow these instructions at home:  Watch your growth for any changes.  Keep all follow-up visits as told by your health care provider. This is important.  Do not scratch or pick at the growth or growths. This can cause them to become irritated or infected. Contact a health care provider  if:  You suddenly have many new growths.  Your growth bleeds, itches, or hurts.  Your growth suddenly becomes larger or changes color. This information is not intended to replace advice given to you by your health care provider. Make sure you discuss any questions you have with your health care provider. Document Released: 02/13/2010 Document Revised: 06/19/2015 Document Reviewed: 05/29/2014 Elsevier Interactive Patient Education  2018 [employer].     IF you received an x-ray today, you will receive an invoice from [facility]. Please contact [facility] at [phone] with questions or concerns regarding your invoice.   IF you received labwork today, you will receive an invoice from [facility]. Please contact [facility] at [phone] with questions or concerns regarding your invoice.   Our billing staff will not be able to assist you with questions regarding bills from these companies.  You will be contacted with the lab results as soon as they are available. The fastest way to get your results is to activate your My Chart account. Instructions are located on the last page of this paperwork. If you have not heard from [place] regarding the results in 2 weeks, please contact this office.

## 2017-05-13 NOTE — Progress Notes (Signed)
    MRN: [id number] DOB: 1945/08/21  Subjective:   Kristina Ayala is a 72 y.o. female presenting for 1 week history of intermittent bleeding and drainage from her back.  Patient reports that she noticed this when she was showering one day.  She denies fever, pain, drainage of pus, frank bleeding today.  She states that she has not been able to check her back and does not know where she is bleeding from.  She admits that she has many moles throughout her entire torso including her back.  Patient admits that she has been very active in the past month, doing a lot of strenuous physical activity.  She thinks she may have injured herself but is not sure because she does not really feel any pain.  Kristina Ayala has a current medication list which includes the following prescription(s): acetaminophen, alprazolam, amlodipine, aspirin ec, atorvastatin, cetirizine, vitamin b-12, ferrous sulfate, gabapentin, hydrochlorothiazide, lisinopril, magnesium, meloxicam, potassium chloride, prolia, pseudoephedrine, and turmeric. Also is allergic to doxycycline; minocycline; wellbutrin [bupropion]; and penicillins.  Kristina Ayala  has a past medical history of Arthritis, Bursitis, Clostridium difficile infection, Frequent falls, HLD (hyperlipidemia), Hypertension, and Infectious colitis. Also  has a past surgical history that includes Tonsillectomy; ORIF proximal tibial plateau fracture (Left, 02/19/2012); Polypectomy; and Colonoscopy.  Objective:   Vitals: BP 122/72   Pulse 83   Temp 98.3 F (36.8 C) (Oral)   Resp 17   Ht 5' 5.5" (1.664 m)   Wt 155 lb (70.3 kg)   SpO2 98%   BMI 25.40 kg/m   Physical Exam  Constitutional: She is oriented to person, place, and time. She appears well-developed and well-nourished.  Cardiovascular: Normal rate.  Pulmonary/Chest: Effort normal.  Neurological: She is alert and oriented to person, place, and time.      Assessment and Plan :   Inflamed seborrheic keratosis - Plan:  Ambulatory referral to Dermatology  Seborrheic keratosis - Plan: Ambulatory referral to Dermatology  Pigmented skin lesion of uncertain nature - Plan: Ambulatory referral to Dermatology  Patient has multiple hyperpigmented scaly lesions of varying sizes and shapes.  Most are consistent with seborrheic keratosis which I explained to patient.  However she does have one that appears to either be inflamed or is a lesion of unknown etiology.  Since the lesion was not bothering patient she was not agreeable to skin biopsy.  She was agreeable to starting antibiotic given that it is an open wound to cover for infection.  I will refer her to dermatology for consult and biopsy.  I recommended patient come in if she is agreeable to let me do the biopsy if her dermatology appointment is scheduled to far out.  Patient said she would consider this.  Jaynee Eagles, PA-C [facility] [phone] 05/13/2017  12:24 PM

## 2017-05-19 ENCOUNTER — Telehealth: Payer: Self-pay | Admitting: *Deleted

## 2017-05-19 NOTE — Telephone Encounter (Signed)
Copied from [place] [id number]. Topic: General - Other >> May 19, 2017  3:14 PM Kristina Ayala L wrote: Reason for CRM: Patient would like a call back from Dr. Tamala Julian or cma about her recent hospital admission in relation to missing physical therapy AND having to pack up her townhouse and go to a hotel temporarily due to renovations.

## 2017-05-20 ENCOUNTER — Other Ambulatory Visit: Payer: Self-pay

## 2017-05-20 DIAGNOSIS — M549 Dorsalgia, unspecified: Principal | ICD-10-CM

## 2017-05-20 DIAGNOSIS — G8929 Other chronic pain: Secondary | ICD-10-CM

## 2017-05-20 NOTE — Progress Notes (Unsigned)
aam ?

## 2017-05-25 ENCOUNTER — Other Ambulatory Visit: Payer: Self-pay | Admitting: Internal Medicine

## 2017-05-26 NOTE — Telephone Encounter (Signed)
[place] Controlled Substance Database checked. Last filled on 03/17/17

## 2017-05-27 ENCOUNTER — Ambulatory Visit
Admission: RE | Admit: 2017-05-27 | Discharge: 2017-05-27 | Disposition: A | Discharge status: Home / Self Care | Payer: Medicare Other | Source: Ambulatory Visit | Attending: Internal Medicine | Admitting: Internal Medicine

## 2017-05-27 ENCOUNTER — Ambulatory Visit: Payer: Medicare Other

## 2017-05-27 DIAGNOSIS — Z1231 Encounter for screening mammogram for malignant neoplasm of breast: Secondary | ICD-10-CM | POA: Diagnosis not present

## 2017-05-27 DIAGNOSIS — Z139 Encounter for screening, unspecified: Secondary | ICD-10-CM

## 2017-05-30 ENCOUNTER — Encounter: Payer: Self-pay | Admitting: Physical Therapy

## 2017-05-30 ENCOUNTER — Ambulatory Visit: Payer: Medicare Other | Attending: Family Medicine | Admitting: Physical Therapy

## 2017-05-30 DIAGNOSIS — M545 Low back pain, unspecified: Secondary | ICD-10-CM

## 2017-05-30 DIAGNOSIS — R296 Repeated falls: Secondary | ICD-10-CM | POA: Insufficient documentation

## 2017-05-30 DIAGNOSIS — M6281 Muscle weakness (generalized): Secondary | ICD-10-CM | POA: Diagnosis not present

## 2017-05-30 DIAGNOSIS — M62830 Muscle spasm of back: Secondary | ICD-10-CM

## 2017-05-30 NOTE — Therapy (Signed)
[facility] [address] [place], [place], [address] Phone: [phone]   Fax:  [phone]  Physical Therapy Evaluation  Patient Details  Name: Kristina Ayala MRN: [id number] Date of Birth: 22-Dec-1945 Referring Provider: Creig Hines   Encounter Date: 05/30/2017  PT End of Session - 05/30/17 1503    Visit Number  1    Date for PT Re-Evaluation  06/30/17    PT Start Time  1440    PT Stop Time  1535    PT Time Calculation (min)  55 min    Activity Tolerance  Patient limited by pain;Patient limited by fatigue    Behavior During Therapy  Flat affect       Past Medical History:  Diagnosis Date  . Arthritis   . Bursitis    left elbow  . Clostridium difficile infection   . Frequent falls   . HLD (hyperlipidemia)   . Hypertension   . Infectious colitis     Past Surgical History:  Procedure Laterality Date  . COLONOSCOPY    . ORIF PROXIMAL TIBIAL PLATEAU FRACTURE Left 02/19/2012  . POLYPECTOMY    . TONSILLECTOMY      There were no vitals filed for this visit.   Subjective Assessment - 05/30/17 1445    Subjective  Patient was seen here for two visits in February for low back, she has been under a lot of stress with her child moving away, she has been relocated out of her apartment and is in a hotel.  She has been admitted to the hospital for a 3 night stay without any specific results, reports that the pain has continued     Limitations  Walking;House hold activities    Patient Stated Goals  have less pain and no falls    Currently in Pain?  Yes    Pain Score  6     Pain Location  Back    Pain Orientation  Lower    Pain Descriptors / Indicators  Aching;Sore;Tightness    Pain Type  Acute pain    Pain Onset  More than a month ago    Pain Frequency  Constant    Aggravating Factors   packing, stress pain has been up to 9/10 over the past month    Pain Relieving Factors  rest, mm relaxer help some, at best pain  a 4/10    Effect of Pain on Daily Activities  limits everything         [facility] PT Assessment - 05/30/17 0001      Assessment   Medical Diagnosis  low back pain, weakness, falls    Referring Provider  Z. Smith    Onset Date/Surgical Date  04/30/17    Prior Therapy  2 visits in February      Precautions   Precautions  Fall      Balance Screen   Has the patient fallen in the past 6 months  Yes    How many times?  6    Has the patient had a decrease in activity level because of a fear of falling?   Yes    Is the patient reluctant to leave their home because of a fear of falling?   Yes      Home Environment   Additional Comments  has stairs but has a lift, does some housework      Prior Function   Level of Independence  Independent  Vocation  Retired    Leisure  no exercise      ROM / Strength   AROM / PROM / Strength  AROM;Strength      AROM   Overall AROM Comments  Lumbar ROM was decreased 50% with back pain and very slow and gaurded motions      Strength   Overall Strength Comments  4-/5 for the LE's with increased pain in the low back      Flexibility   Soft Tissue Assessment /Muscle Length  -- patient dificulty moving, LE mms tight and sore      Transfers   Comments  all transfers are gaurded and slow and she appears to be in pain      Standardized Balance Assessment   Standardized Balance Assessment  Timed Up and Go Test;Berg Balance Test      Berg Balance Test   Sit to Stand  Able to stand  independently using hands    Standing Unsupported  Able to stand safely 2 minutes    Sitting with Back Unsupported but Feet Supported on Floor or Stool  Able to sit safely and securely 2 minutes    Stand to Sit  Controls descent by using hands    Transfers  Able to transfer safely, definite need of hands    Standing Unsupported with Eyes Closed  Able to stand 3 seconds    Standing Ubsupported with Feet Together  Needs help to attain position but able to stand for 30  seconds with feet together    From Standing, Reach Forward with Outstretched Arm  Can reach forward >12 cm safely (5")    From Standing Position, Pick up Object from Floor  Able to pick up shoe, needs supervision    From Standing Position, Turn to Look Behind Over each Shoulder  Turn sideways only but maintains balance    Turn 360 Degrees  Able to turn 360 degrees safely one side only in 4 seconds or less    Standing Unsupported, Alternately Place Feet on Step/Stool  Able to complete 4 steps without aid or supervision    Standing Unsupported, One Foot in Front  Needs help to step but can hold 15 seconds    Standing on One Leg  Tries to lift leg/unable to hold 3 seconds but remains standing independently    Total Score  35      Timed Up and Go Test   Normal TUG (seconds)  20                Objective measurements completed on examination: See above findings.      [facility] Adult PT Treatment/Exercise - 05/30/17 0001      Moist Heat Therapy   Number Minutes Moist Heat  15 Minutes    Moist Heat Location  Lumbar Spine      Electrical Stimulation   Electrical Stimulation Location  low back    Electrical Stimulation Action  IFC    Electrical Stimulation Parameters  supine    Electrical Stimulation Goals  Pain               PT Short Term Goals - 05/30/17 1507      PT SHORT TERM GOAL #1   Title  be independent in initial HEP    Time  2    Period  Weeks    Status  New        PT Long Term Goals - 05/30/17 1507  PT LONG TERM GOAL #1   Title  be independent in advanced HEP    Time  8    Period  Weeks    Status  New      PT LONG TERM GOAL #2   Title  decrease TUG to 14 seconds    Time  8    Period  Weeks    Status  New      PT LONG TERM GOAL #3   Title  increase Berg balance test to 46/56    Time  8    Period  Weeks    Status  New      PT LONG TERM GOAL #4   Title  report a 50% reduction in LBP with ADLs and self-care    Time  8    Period  Weeks     Status  New      PT LONG TERM GOAL #5   Title  report no falls x 4 weeks    Time  8    Period  Weeks    Status  New             Plan - 05/30/17 1504    Clinical Impression Statement  Patient was seen here for 2 visits in February, she reports that she had a lot of stress and activity going on and was not able to come back, her daughter and grandchild have moved out, she is no living in a hotel after her apartment complex moved her there to renovate her apartment.  She reports that her lifting and packing caused some increased pain, she seems very tired and fatigued, There is a slight drop in her Berg score and her TUG time since February.    Clinical Presentation  Stable    Clinical Decision Making  Moderate    Rehab Potential  Good    PT Frequency  2x / week    PT Duration  8 weeks    PT Treatment/Interventions  ADLs/Self Care Home Management;Cryotherapy;Electrical Stimulation;Gait training;Neuromuscular re-education;Balance training;Therapeutic exercise;Therapeutic activities;Moist Heat;Stair training;Functional mobility training;Patient/family education;Manual techniques    PT Next Visit Plan  Patient again reporting high copay and does want to limit her visits    Consulted and Agree with Plan of Care  Patient       Patient will benefit from skilled therapeutic intervention in order to improve the following deficits and impairments:  Abnormal gait, Decreased range of motion, Difficulty walking, Increased muscle spasms, Decreased activity tolerance, Pain, Decreased balance, Impaired flexibility, Improper body mechanics, Decreased mobility, Decreased strength  Visit Diagnosis: Acute bilateral low back pain without sciatica - Plan: PT plan of care cert/re-cert  Repeated falls - Plan: PT plan of care cert/re-cert  Muscle spasm of back - Plan: PT plan of care cert/re-cert  Muscle weakness (generalized) - Plan: PT plan of care cert/re-cert     Problem List Patient Active  Problem List   Diagnosis Date Noted  . Hypokalemia 04/18/2017  . Degenerative arthritis of left knee 12/01/2016  . Pilonidal cyst 12/01/2016  . Anxiety 12/01/2016  . Weight loss, non-intentional 12/01/2016  . Prediabetes 09/16/2016  . Sleep difficulties 09/16/2016  . Bursitis of right shoulder 09/04/2015  . Lateral meniscus derangement 06/03/2015  . Tear of LCL (lateral collateral ligament) of knee 06/03/2015  . Lumbar radiculopathy 09/19/2014  . Low back pain 01/04/2014  . Bilateral knee pain 01/04/2014  . Diverticulosis of colon without hemorrhage 07/26/2013  . Trigger thumb of left hand 03/06/2013  .  Essential hypertension, benign 02/15/2013  . Hyperlipidemia 09/28/2012  . COPD (chronic obstructive pulmonary disease) ([place]) 05/22/2012  . Frequent falls 05/22/2012  . Osteoporosis 05/22/2012  . Atypical chest pain 11/12/2011    Sumner Boast., PT 05/30/2017, 3:10 PM  [facility] [place], [place], [address] Phone: [phone]   Fax:  [phone]  Name: Kristina Ayala MRN: [id number] Date of Birth: 1945/05/05

## 2017-06-02 ENCOUNTER — Encounter: Payer: Self-pay | Admitting: Urgent Care

## 2017-06-02 ENCOUNTER — Ambulatory Visit (INDEPENDENT_AMBULATORY_CARE_PROVIDER_SITE_OTHER): Payer: Medicare Other | Admitting: Urgent Care

## 2017-06-02 VITALS — BP 157/75 | HR 90 | Temp 98.4°F | Resp 16 | Ht 65.5 in | Wt 159.4 lb

## 2017-06-02 DIAGNOSIS — L821 Other seborrheic keratosis: Secondary | ICD-10-CM

## 2017-06-02 DIAGNOSIS — L819 Disorder of pigmentation, unspecified: Secondary | ICD-10-CM

## 2017-06-02 DIAGNOSIS — L820 Inflamed seborrheic keratosis: Secondary | ICD-10-CM

## 2017-06-02 NOTE — Progress Notes (Signed)
    MRN: [id number] DOB: 1945/05/31  Subjective:   Kristina Ayala is a 72 y.o. female presenting for aloe up on multiple skin lesions.  She was last seen on 05/13/2017 for inflamed seborrheic keratosis and multiple hyperpigmented skin lesions over her the majority of her back and part of her abdomen.  Since her last visit her inflamed seborrheic keratosis has resolved.  But she would still like to pursue referral to dermatology and is simply wondering why she was never contacted for this.  She is declining a skin biopsy today.  Kristina Ayala has a current medication list which includes the following prescription(s): acetaminophen, alprazolam, amlodipine, aspirin ec, atorvastatin, cetirizine, vitamin b-12, ferrous sulfate, gabapentin, hydrochlorothiazide, lisinopril, magnesium, meloxicam, potassium chloride, prolia, and turmeric. Also is allergic to doxycycline; minocycline; wellbutrin [bupropion]; and penicillins.  Kristina Ayala  has a past medical history of Arthritis, Bursitis, Clostridium difficile infection, Frequent falls, HLD (hyperlipidemia), Hypertension, and Infectious colitis. Also  has a past surgical history that includes Tonsillectomy; ORIF proximal tibial plateau fracture (Left, 02/19/2012); Polypectomy; and Colonoscopy.  Objective:   Vitals: BP (!) 157/75   Pulse 90   Temp 98.4 F (36.9 C) (Oral)   Resp 16   Ht 5' 5.5" (1.664 m)   Wt 159 lb 6.4 oz (72.3 kg)   SpO2 99%   BMI 26.12 kg/m   Physical Exam  Assessment and Plan :   Inflamed seborrheic keratosis - Plan: Ambulatory referral to Dermatology  Seborrheic keratosis - Plan: Ambulatory referral to Dermatology  Pigmented skin lesions - Plan: Ambulatory referral to Dermatology  A referral was placed at her last visit and I am not entirely sure why she was never contacted.  I will reach out to our referral staff to see if they can follow-up on this.  Kristina Eagles, PA-C [facility] [phone] 06/02/2017  2:23 PM

## 2017-06-02 NOTE — Patient Instructions (Addendum)
Seborrheic Keratosis Seborrheic keratosis is a common, noncancerous (benign) skin growth. This condition causes waxy, rough, tan, brown, or black spots to appear on the skin. These skin growths can be flat or raised. What are the causes? The cause of this condition is not known. What increases the risk? This condition is more likely to develop in:  People who have a family history of seborrheic keratosis.  People who are 21 or older.  People who are pregnant.  People who have had estrogen replacement therapy.  What are the signs or symptoms? This condition often occurs on the face, chest, shoulders, back, or other areas. These growths:  Are usually painless, but may become irritated and itchy.  Can be yellow, brown, black, or other colors.  Are slightly raised or have a flat surface.  Are sometimes rough or wart-like in texture.  Are often waxy on the surface.  Are round or oval-shaped.  Sometimes look like they are "stuck on."  Often occur in groups, but may occur as a single growth.  How is this diagnosed? This condition is diagnosed with a medical history and physical exam. A sample of the growth may be tested (skin biopsy). You may need to see a skin specialist (dermatologist). How is this treated? Treatment is not usually needed for this condition, unless the growths are irritated or are often bleeding. You may also choose to have the growths removed if you do not like their appearance. Most commonly, these growths are treated with a procedure in which liquid nitrogen is applied to "freeze" off the growth (cryosurgery). They may also be burned off with electricity or cut off. Follow these instructions at home:  Watch your growth for any changes.  Keep all follow-up visits as told by your health care provider. This is important.  Do not scratch or pick at the growth or growths. This can cause them to become irritated or infected. Contact a health care provider  if:  You suddenly have many new growths.  Your growth bleeds, itches, or hurts.  Your growth suddenly becomes larger or changes color. This information is not intended to replace advice given to you by your health care provider. Make sure you discuss any questions you have with your health care provider. Document Released: 02/13/2010 Document Revised: 06/19/2015 Document Reviewed: 05/29/2014 Elsevier Interactive Patient Education  2018 [employer].     IF you received an x-ray today, you will receive an invoice from [facility]. Please contact [facility] at [phone] with questions or concerns regarding your invoice.   IF you received labwork today, you will receive an invoice from [facility]. Please contact [facility] at [phone] with questions or concerns regarding your invoice.   Our billing staff will not be able to assist you with questions regarding bills from these companies.  You will be contacted with the lab results as soon as they are available. The fastest way to get your results is to activate your My Chart account. Instructions are located on the last page of this paperwork. If you have not heard from [place] regarding the results in 2 weeks, please contact this office.

## 2017-06-02 NOTE — Progress Notes (Signed)
    MRN: [id number] DOB: [birth year]  Subjective:   Kristina Ayala is a 72 y.o. female presenting for   [name] has a current medication list which includes the following prescription(s): acetaminophen, alprazolam, amlodipine, aspirin ec, atorvastatin, cetirizine, vitamin b-12, ferrous sulfate, gabapentin, hydrochlorothiazide, lisinopril, magnesium, meloxicam, potassium chloride, prolia, and turmeric. Also is allergic to doxycycline; minocycline; wellbutrin [bupropion]; and penicillins.  Kristina Ayala  has a past medical history of Arthritis, Bursitis, Clostridium difficile infection, Frequent falls, HLD (hyperlipidemia), Hypertension, and Infectious colitis. Also  has a past surgical history that includes Tonsillectomy; ORIF proximal tibial plateau fracture (Left, 02/19/2012); Polypectomy; and Colonoscopy.  Objective:   Vitals: BP (!) 157/75   Pulse 90   Temp 98.4 F (36.9 C) (Oral)   Resp 16   Ht 5' 5.5" (1.664 m)   Wt 159 lb 6.4 oz (72.3 kg)   SpO2 99%   BMI 26.12 kg/m   Physical Exam  No results found for this or any previous visit (from the past 24 hour(s)).  Assessment and Plan :     Jaynee Eagles, PA-C [facility] [phone] 06/02/2017  2:23 PM

## 2017-06-07 ENCOUNTER — Other Ambulatory Visit: Payer: Self-pay | Admitting: Internal Medicine

## 2017-06-21 ENCOUNTER — Telehealth: Payer: Self-pay | Admitting: Internal Medicine

## 2017-06-21 ENCOUNTER — Ambulatory Visit: Payer: Self-pay | Admitting: *Deleted

## 2017-06-21 MED ORDER — DENOSUMAB 60 MG/ML ~~LOC~~ SOSY: 60.0000 mg | PREFILLED_SYRINGE | Freq: Once | SUBCUTANEOUS | 0 refills | Status: DC

## 2017-06-21 NOTE — Telephone Encounter (Signed)
Calling into [facility] per patient request

## 2017-06-21 NOTE — Telephone Encounter (Signed)
Patient is staying in a hotel while her apartment is being renovated. She has begun to have allergy-like symptoms. Nasal congestion, post-nasal drip with scratchy throat. Denies fever/SOB. Reviewed home care advice per protocol. Reason for Disposition . Cold with no complications  Answer Assessment - Initial Assessment Questions 1. ONSET: "When did the nasal discharge start?"      About one month ago. 2. AMOUNT: "How much discharge is there?"      none 3. COUGH: "Do you have a cough?" If yes, ask: "Describe the color of your sputum" (clear, white, yellow, green)     Dry cough 4. RESPIRATORY DISTRESS: "Describe your breathing."      ol 5. FEVER: "Do you have a fever?" If so, ask: "What is your temperature, how was it measured, and when did it start?"     none 6. SEVERITY: "Overall, how bad are you feeling right now?" (e.g., doesn't interfere with normal activities, staying home from school/work, staying in bed)      yes 7. OTHER SYMPTOMS: "Do you have any other symptoms?" (e.g., sore throat, earache, wheezing, vomiting)     Post nasal drip, throat and ear sensitivity 8. PREGNANCY: "Is there any chance you are pregnant?" "When was your last menstrual period?"     no  Protocols used: COMMON COLD-A-AH

## 2017-06-21 NOTE — Telephone Encounter (Signed)
Spoke to patient regarding her Prolia injection.  Insurance was verified and summary of benefits states that patient would be responsible for a $260 copay at the time of the injection. Previously, she was enrolled in [facility] and we would suggest that she re-enrol to be able to use these benefits.  When speaking with the patient, she said that [facility] told her the injection to pick up would only cost her $8. I explained to her about the "donut hole" and she said that she does not get a lot of prescriptions and would rather get it straight from [facility].  Appointment has been scheduled for June 10th at 10:15am. She will pick up the injection on the way to the office.  Can you send this is for her, please?

## 2017-06-23 DIAGNOSIS — L918 Other hypertrophic disorders of the skin: Secondary | ICD-10-CM | POA: Diagnosis not present

## 2017-06-23 DIAGNOSIS — L821 Other seborrheic keratosis: Secondary | ICD-10-CM | POA: Diagnosis not present

## 2017-07-04 ENCOUNTER — Ambulatory Visit (INDEPENDENT_AMBULATORY_CARE_PROVIDER_SITE_OTHER): Payer: Medicare Other

## 2017-07-04 ENCOUNTER — Telehealth: Payer: Self-pay | Admitting: Urgent Care

## 2017-07-04 DIAGNOSIS — M810 Age-related osteoporosis without current pathological fracture: Secondary | ICD-10-CM | POA: Diagnosis not present

## 2017-07-04 MED ORDER — DENOSUMAB 60 MG/ML ~~LOC~~ SOSY
60.0000 mg | PREFILLED_SYRINGE | Freq: Once | SUBCUTANEOUS | Status: AC
  Administered 2017-07-04: 60 mg via SUBCUTANEOUS

## 2017-07-04 NOTE — Telephone Encounter (Signed)
Pt is scheduled with Dr. Pearline Cables at [facility] on 06/23/17

## 2017-07-14 ENCOUNTER — Ambulatory Visit (INDEPENDENT_AMBULATORY_CARE_PROVIDER_SITE_OTHER): Payer: Medicare Other | Admitting: *Deleted

## 2017-07-14 VITALS — BP 136/68 | HR 86 | Resp 16 | Ht 66.0 in | Wt 165.0 lb

## 2017-07-14 DIAGNOSIS — Z0000 Encounter for general adult medical examination without abnormal findings: Secondary | ICD-10-CM | POA: Diagnosis not present

## 2017-07-14 NOTE — Progress Notes (Addendum)
Subjective:   Kristina Ayala is a 72 y.o. female who presents for Medicare Annual (Subsequent) preventive examination.  Review of Systems:  No ROS.  Medicare Wellness Visit. Additional risk factors are reflected in the social history.  Cardiac Risk Factors include: advanced age (>16men, >3 women);dyslipidemia;hypertension Sleep patterns: has restless sleep, gets up 1-2 times nightly to void and sleeps 6 hours nightly.  Patient reports insomnia issues, discussed recommended sleep tips.   Home Safety/Smoke Alarms: Feels safe in home. Smoke alarms in place.  Living environment; residence and Firearm Safety: 1-story house/ trailer, no firearms. Lives alone, no needs for DME, good support system Seat Belt Safety/Bike Helmet: Wears seat belt.    Objective:     Vitals: BP 136/68   Pulse 86   Resp 16   Ht 5\' 6"  (1.676 m)   Wt 165 lb (74.8 kg)   SpO2 99%   BMI 26.63 kg/m   Body mass index is 26.63 kg/m.  Advanced Directives 07/14/2017 04/18/2017 03/24/2017 01/06/2017 08/03/2016 07/25/2016 01/27/2016  Does Patient Have a Medical Advance Directive? No No No No No No No  Does patient want to make changes to medical advance directive? - - - - - - -  Would patient like information on creating a medical advance directive? Yes (ED - Information included in AVS) No - Patient declined - - No - Patient declined - No - Patient declined    Tobacco Social History   Tobacco Use  Smoking Status Former Smoker  . Packs/day: 0.25  . Years: 50.00  . Pack years: 12.50  . Types: Cigarettes  . Last attempt to quit: 08/28/2014  . Years since quitting: 2.8  Smokeless Tobacco Never Used  Tobacco Comment   1 pack every 3 days     Counseling given: Not Answered Comment: 1 pack every 3 days  Past Medical History:  Diagnosis Date  . Arthritis   . Bursitis    left elbow  . Clostridium difficile infection   . Frequent falls   . HLD (hyperlipidemia)   . Hypertension   . Infectious colitis     Past Surgical History:  Procedure Laterality Date  . COLONOSCOPY    . ORIF PROXIMAL TIBIAL PLATEAU FRACTURE Left 02/19/2012   knee  . POLYPECTOMY    . TONSILLECTOMY     Family History  Problem Relation Age of Onset  . Diverticulosis Mother   . Ovarian cancer Mother   . Pancreatic cancer Father   . Lung cancer Father   . Colon polyps Sister   . Colon cancer Neg Hx    Social History   Socioeconomic History  . Marital status: Divorced    Spouse name: Not on file  . Number of children: 2  . Years of education: Not on file  . Highest education level: Not on file  Occupational History  . Occupation: RETIRED  Social Needs  . Financial resource strain: Not hard at all  . Food insecurity:    Worry: Never true    Inability: Never true  . Transportation needs:    Medical: No    Non-medical: No  Tobacco Use  . Smoking status: Former Smoker    Packs/day: 0.25    Years: 50.00    Pack years: 12.50    Types: Cigarettes    Last attempt to quit: 08/28/2014    Years since quitting: 2.8  . Smokeless tobacco: Never Used  . Tobacco comment: 1 pack every 3 days  Substance and Sexual  Activity  . Alcohol use: No    Alcohol/week: 0.0 oz  . Drug use: No  . Sexual activity: Not Currently  Lifestyle  . Physical activity:    Days per week: 4 days    Minutes per session: 40 min  . Stress: Not at all  Relationships  . Social connections:    Talks on phone: More than three times a week    Gets together: More than three times a week    Attends religious service: More than 4 times per year    Active member of club or organization: Yes    Attends meetings of clubs or organizations: More than 4 times per year    Relationship status: Not on file  Other Topics Concern  . Not on file  Social History Narrative   Lives alone has 2 supportive children.       Outpatient Encounter Medications as of 07/14/2017  Medication Sig  . ALPRAZolam (XANAX) 0.5 MG tablet Take 1 tablet (0.5 mg total)  by mouth at bedtime as needed for anxiety.  [name] amLODipine (NORVASC) 5 MG tablet TAKE 1 TABLET(5 MG) BY MOUTH DAILY  . aspirin EC 81 MG tablet Take 81 mg by mouth daily.  [name] atorvastatin (LIPITOR) 40 MG tablet TAKE 1 TABLET(40 MG) BY MOUTH EVERY MORNING  . Cholecalciferol (VITAMIN D3) 10000 units TABS Take 1 tablet by mouth.  . Cyanocobalamin (VITAMIN B-12) 2500 MCG SUBL Place 1 tablet under the tongue daily.  . ferrous sulfate 325 (65 FE) MG tablet Take 1 tablet (325 mg total) by mouth daily with breakfast.  . gabapentin (NEURONTIN) 300 MG capsule Take 1 capsule (300 mg total) by mouth at bedtime.  . hydrochlorothiazide (HYDRODIURIL) 25 MG tablet TAKE 1 TABLET(25 MG) BY MOUTH EVERY MORNING  . lisinopril (PRINIVIL,ZESTRIL) 40 MG tablet Take 1 tablet (40 mg total) by mouth daily.  . Magnesium 100 MG CAPS Take 100 mg by mouth daily.  . meloxicam (MOBIC) 7.5 MG tablet Take 1 tablet (7.5 mg total) by mouth daily.  . potassium chloride (KLOR-CON) 8 MEQ tablet Take 2 tablets (16 mEq total) daily by mouth.  [name] tiZANidine (ZANAFLEX) 4 MG tablet   . TURMERIC PO Take 1 tablet by mouth daily.  . [DISCONTINUED] cetirizine (ZYRTEC) 10 MG tablet Take 1 tablet (10 mg total) by mouth daily.  . [DISCONTINUED] acetaminophen (TYLENOL) 500 MG tablet Take 1,000 mg by mouth every 6 (six) hours as needed (For burn pain.).   No facility-administered encounter medications on file as of 07/14/2017.     Activities of Daily Living In your present state of health, do you have any difficulty performing the following activities: 07/14/2017 04/18/2017  Hearing? N N  Vision? N N  Difficulty concentrating or making decisions? N N  Walking or climbing stairs? N N  Dressing or bathing? N N  Doing errands, shopping? N N  Preparing Food and eating ? N -  Using the Toilet? N -  In the past six months, have you accidently leaked urine? N -  Do you have problems with loss of bowel control? N -  Managing your Medications? N -   Managing your Finances? N -  Housekeeping or managing your Housekeeping? N -  Some recent data might be hidden    Patient Care Team: [name], MD as PCP - General (Internal Medicine) Lyndal Pulley, DO as Attending Physician (Family Medicine)    Assessment:   This is a routine wellness examination for Davelyn.  Physical assessment deferred to PCP.   Exercise Activities and Dietary recommendations Current Exercise Habits: Structured exercise class, Type of exercise: walking(water aerobics), Time (Minutes): 45, Frequency (Times/Week): 3, Weekly Exercise (Minutes/Week): 135, Intensity: Mild, Exercise limited by: None identified  Diet (meal preparation, eat out, water intake, caffeinated beverages, dairy products, fruits and vegetables): in general, a "healthy" diet  , well balanced   Reviewed heart healthy and diabetic diet. Encouraged patient to increase daily water and fluid intake. Discussed weight loss strategies, Diet education was provided via handout.  Goals    . 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         Fall Risk Fall Risk  07/14/2017 05/13/2017 04/05/2017 03/17/2017 01/21/2017  Falls in the past year? No No Yes Yes No  Comment - - - - -  Number falls in past yr: - - 1 2 or more -  Injury with Fall? - - No - -  Risk Factor Category  - - - - -  Risk for fall due to : - - - - -  Follow up - - - - -  Comment - - - - -    Depression Screen PHQ 2/9 Scores 07/14/2017 05/13/2017 04/05/2017 03/25/2017  PHQ - 2 Score 0 0 0 0     Cognitive Function       Ad8 score reviewed for issues:  Issues making decisions: no  Less interest in hobbies / activities: no  Repeats questions, stories (family complaining): no  Trouble using ordinary gadgets (microwave, computer, phone):no  Forgets the month or year: no  Mismanaging finances: no  Remembering appts: no  Daily problems with thinking and/or memory: no Ad8  score is= 0  Immunization History  Administered Date(s) Administered  . Influenza, High Dose Seasonal PF 11/10/2016  . Influenza,inj,Quad PF,6+ Mos 11/12/2013, 11/27/2014  . Influenza-Unspecified 11/26/2015  . Pneumococcal Conjugate-13 04/30/2015  . Pneumococcal Polysaccharide-23 01/04/2014  . Tdap 07/29/2014   Screening Tests Health Maintenance  Topic Date Due  . INFLUENZA VACCINE  08/25/2017  . DEXA SCAN  03/03/2018  . MAMMOGRAM  05/28/2019  . COLONOSCOPY  06/08/2022  . TETANUS/TDAP  07/28/2024  . Hepatitis C Screening  Completed  . PNA vac Low Risk Adult  Completed      Plan:   Continue doing brain stimulating activities (puzzles, reading, adult coloring books, staying active) to keep memory sharp.   Continue to eat heart healthy diet (full of fruits, vegetables, whole grains, lean protein, water--limit salt, fat, and sugar intake) and increase physical activity as tolerated.   I have personally reviewed and noted the following in the patient's chart:   . Medical and social history . Use of alcohol, tobacco or illicit drugs  . Current medications and supplements . Functional ability and status . Nutritional status . Physical activity . Advanced directives . List of other physicians . Vitals . Screenings to include cognitive, depression, and falls . Referrals and appointments  In addition, I have reviewed and discussed with patient certain preventive protocols, quality metrics, and best practice recommendations. A written personalized care plan for preventive services as well as general preventive health recommendations were provided to patient.     Michiel Cowboy, RN  07/14/2017   Medical screening examination/treatment/procedure(s) were performed by non-physician practitioner and as supervising physician I was immediately available for consultation/collaboration. I agree with above. [name], MD

## 2017-07-14 NOTE — Patient Instructions (Addendum)
Continue doing brain stimulating activities (puzzles, reading, adult coloring books, staying active) to keep memory sharp.   Continue to eat heart healthy diet (full of fruits, vegetables, whole grains, lean protein, water--limit salt, fat, and sugar intake) and increase physical activity as tolerated.   Kristina Ayala , Thank you for taking time to come for your Medicare Wellness Visit. I appreciate your ongoing commitment to your health goals. Please review the following plan we discussed and let me know if I can assist you in the future.   These are the goals we discussed: Goals    . 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         This is a list of the screening recommended for you and due dates:  Health Maintenance  Topic Date Due  . Flu Shot  08/25/2017  . DEXA scan (bone density measurement)  03/03/2018  . Mammogram  05/28/2019  . Colon Cancer Screening  06/08/2022  . Tetanus Vaccine  07/28/2024  .  Hepatitis C: One time screening is recommended by Center for Disease Control  (CDC) for  adults born from [birth year] through 1966.   Completed  . Pneumonia vaccines  Completed   Health Maintenance, Female Adopting a healthy lifestyle and getting preventive care can go a long way to promote health and wellness. Talk with your health care provider about what schedule of regular examinations is right for you. This is a good chance for you to check in with your provider about disease prevention and staying healthy. In between checkups, there are plenty of things you can do on your own. Experts have done a lot of research about which lifestyle changes and preventive measures are most likely to keep you healthy. Ask your health care provider for more information. Weight and diet Eat a healthy diet  Be sure to include plenty of vegetables, fruits, low-fat dairy products, and lean protein.  Do not eat a lot of foods high in solid fats, added sugars,  or salt.  Get regular exercise. This is one of the most important things you can do for your health. ? Most adults should exercise for at least 150 minutes each week. The exercise should increase your heart rate and make you sweat (moderate-intensity exercise). ? Most adults should also do strengthening exercises at least twice a week. This is in addition to the moderate-intensity exercise.  Maintain a healthy weight  Body mass index (BMI) is a measurement that can be used to identify possible weight problems. It estimates body fat based on height and weight. Your health care provider can help determine your BMI and help you achieve or maintain a healthy weight.  For females 24 years of age and older: ? A BMI below 18.5 is considered underweight. ? A BMI of 18.5 to 24.9 is normal. ? A BMI of 25 to 29.9 is considered overweight. ? A BMI of 30 and above is considered obese.  Watch levels of cholesterol and blood lipids  You should start having your blood tested for lipids and cholesterol at 73 years of age, then have this test every 5 years.  You may need to have your cholesterol levels checked more often if: ? Your lipid or cholesterol levels are high. ? You are older than 73 years of age. ? You are at high risk for heart disease.  Cancer screening Lung Cancer  Lung cancer screening is recommended for adults 28-58 years old  who are at high risk for lung cancer because of a history of smoking.  A yearly low-dose CT scan of the lungs is recommended for people who: ? Currently smoke. ? Have quit within the past 15 years. ? Have at least a 30-pack-year history of smoking. A pack year is smoking an average of one pack of cigarettes a day for 1 year.  Yearly screening should continue until it has been 15 years since you quit.  Yearly screening should stop if you develop a health problem that would prevent you from having lung cancer treatment.  Breast Cancer  Practice breast  self-awareness. This means understanding how your breasts normally appear and feel.  It also means doing regular breast self-exams. Let your health care provider know about any changes, no matter how small.  If you are in your 20s or 30s, you should have a clinical breast exam (CBE) by a health care provider every 1-3 years as part of a regular health exam.  If you are 75 or older, have a CBE every year. Also consider having a breast X-ray (mammogram) every year.  If you have a family history of breast cancer, talk to your health care provider about genetic screening.  If you are at high risk for breast cancer, talk to your health care provider about having an MRI and a mammogram every year.  Breast cancer gene (BRCA) assessment is recommended for women who have family members with BRCA-related cancers. BRCA-related cancers include: ? Breast. ? Ovarian. ? Tubal. ? Peritoneal cancers.  Results of the assessment will determine the need for genetic counseling and BRCA1 and BRCA2 testing.  Cervical Cancer Your health care provider may recommend that you be screened regularly for cancer of the pelvic organs (ovaries, uterus, and vagina). This screening involves a pelvic examination, including checking for microscopic changes to the surface of your cervix (Pap test). You may be encouraged to have this screening done every 3 years, beginning at age 23.  For women ages 75-65, health care providers may recommend pelvic exams and Pap testing every 3 years, or they may recommend the Pap and pelvic exam, combined with testing for human papilloma virus (HPV), every 5 years. Some types of HPV increase your risk of cervical cancer. Testing for HPV may also be done on women of any age with unclear Pap test results.  Other health care providers may not recommend any screening for nonpregnant women who are considered low risk for pelvic cancer and who do not have symptoms. Ask your health care provider if a  screening pelvic exam is right for you.  If you have had past treatment for cervical cancer or a condition that could lead to cancer, you need Pap tests and screening for cancer for at least 20 years after your treatment. If Pap tests have been discontinued, your risk factors (such as having a new sexual partner) need to be reassessed to determine if screening should resume. Some women have medical problems that increase the chance of getting cervical cancer. In these cases, your health care provider may recommend more frequent screening and Pap tests.  Colorectal Cancer  This type of cancer can be detected and often prevented.  Routine colorectal cancer screening usually begins at 73 years of age and continues through 73 years of age.  Your health care provider may recommend screening at an earlier age if you have risk factors for colon cancer.  Your health care provider may also recommend using home test kits  to check for hidden blood in the stool.  A small camera at the end of a tube can be used to examine your colon directly (sigmoidoscopy or colonoscopy). This is done to check for the earliest forms of colorectal cancer.  Routine screening usually begins at age 26.  Direct examination of the colon should be repeated every 5-10 years through 73 years of age. However, you may need to be screened more often if early forms of precancerous polyps or small growths are found.  Skin Cancer  Check your skin from head to toe regularly.  Tell your health care provider about any new moles or changes in moles, especially if there is a change in a mole's shape or color.  Also tell your health care provider if you have a mole that is larger than the size of a pencil eraser.  Always use sunscreen. Apply sunscreen liberally and repeatedly throughout the day.  Protect yourself by wearing long sleeves, pants, a wide-brimmed hat, and sunglasses whenever you are outside.  Heart disease, diabetes, and  high blood pressure  High blood pressure causes heart disease and increases the risk of stroke. High blood pressure is more likely to develop in: ? People who have blood pressure in the high end of the normal range (130-139/85-89 mm Hg). ? People who are overweight or obese. ? People who are African American.  If you are 68-71 years of age, have your blood pressure checked every 3-5 years. If you are 3 years of age or older, have your blood pressure checked every year. You should have your blood pressure measured twice-once when you are at a hospital or clinic, and once when you are not at a hospital or clinic. Record the average of the two measurements. To check your blood pressure when you are not at a hospital or clinic, you can use: ? An automated blood pressure machine at a pharmacy. ? A home blood pressure monitor.  If you are between 22 years and 76 years old, ask your health care provider if you should take aspirin to prevent strokes.  Have regular diabetes screenings. This involves taking a blood sample to check your fasting blood sugar level. ? If you are at a normal weight and have a low risk for diabetes, have this test once every three years after 73 years of age. ? If you are overweight and have a high risk for diabetes, consider being tested at a younger age or more often. Preventing infection Hepatitis B  If you have a higher risk for hepatitis B, you should be screened for this virus. You are considered at high risk for hepatitis B if: ? You were born in a country where hepatitis B is common. Ask your health care provider which countries are considered high risk. ? Your parents were born in a high-risk country, and you have not been immunized against hepatitis B (hepatitis B vaccine). ? You have HIV or AIDS. ? You use needles to inject street drugs. ? You live with someone who has hepatitis B. ? You have had sex with someone who has hepatitis B. ? You get hemodialysis  treatment. ? You take certain medicines for conditions, including cancer, organ transplantation, and autoimmune conditions.  Hepatitis C  Blood testing is recommended for: ? Everyone born from [birth year] through 1966. ? Anyone with known risk factors for hepatitis C.  Sexually transmitted infections (STIs)  You should be screened for sexually transmitted infections (STIs) including gonorrhea and chlamydia if: ?  You are sexually active and are younger than 73 years of age. ? You are older than 73 years of age and your health care provider tells you that you are at risk for this type of infection. ? Your sexual activity has changed since you were last screened and you are at an increased risk for chlamydia or gonorrhea. Ask your health care provider if you are at risk.  If you do not have HIV, but are at risk, it may be recommended that you take a prescription medicine daily to prevent HIV infection. This is called pre-exposure prophylaxis (PrEP). You are considered at risk if: ? You are sexually active and do not regularly use condoms or know the HIV status of your partner(s). ? You take drugs by injection. ? You are sexually active with a partner who has HIV.  Talk with your health care provider about whether you are at high risk of being infected with HIV. If you choose to begin PrEP, you should first be tested for HIV. You should then be tested every 3 months for as long as you are taking PrEP. Pregnancy  If you are premenopausal and you may become pregnant, ask your health care provider about preconception counseling.  If you may become pregnant, take 400 to 800 micrograms (mcg) of folic acid every day.  If you want to prevent pregnancy, talk to your health care provider about birth control (contraception). Osteoporosis and menopause  Osteoporosis is a disease in which the bones lose minerals and strength with aging. This can result in serious bone fractures. Your risk for osteoporosis  can be identified using a bone density scan.  If you are 60 years of age or older, or if you are at risk for osteoporosis and fractures, ask your health care provider if you should be screened.  Ask your health care provider whether you should take a calcium or vitamin D supplement to lower your risk for osteoporosis.  Menopause may have certain physical symptoms and risks.  Hormone replacement therapy may reduce some of these symptoms and risks. Talk to your health care provider about whether hormone replacement therapy is right for you. Follow these instructions at home:  Schedule regular health, dental, and eye exams.  Stay current with your immunizations.  Do not use any tobacco products including cigarettes, chewing tobacco, or electronic cigarettes.  If you are pregnant, do not drink alcohol.  If you are breastfeeding, limit how much and how often you drink alcohol.  Limit alcohol intake to no more than 1 drink per day for nonpregnant women. One drink equals 12 ounces of beer, 5 ounces of Kristina Ayala, or 1 ounces of hard liquor.  Do not use street drugs.  Do not share needles.  Ask your health care provider for help if you need support or information about quitting drugs.  Tell your health care provider if you often feel depressed.  Tell your health care provider if you have ever been abused or do not feel safe at home. This information is not intended to replace advice given to you by your health care provider. Make sure you discuss any questions you have with your health care provider. Document Released: 07/27/2010 Document Revised: 06/19/2015 Document Reviewed: 10/15/2014 Elsevier Interactive Patient Education  [employer].  It is important to avoid accidents which may result in broken bones.  Here are a few ideas on how to make your home safer so you will be less likely to trip or fall.  1. Use nonskid mats or non slip strips in your shower or tub, on your bathroom  floor and around sinks.  If you know that you have spilled water, wipe it up! 2. In the bathroom, it is important to have properly installed grab bars on the walls or on the edge of the tub.  Towel racks are NOT strong enough for you to hold onto or to pull on for support. 3. Stairs and hallways should have enough light.  Add lamps or night lights if you need ore light. 4. It is good to have handrails on both sides of the stairs if possible.  Always fix broken handrails right away. 5. It is important to see the edges of steps.  Paint the edges of outdoor steps white so you can see them better.  Put colored tape on the edge of inside steps. 6. Throw-rugs are dangerous because they can slide.  Removing the rugs is the best idea, but if they must stay, add adhesive carpet tape to prevent slipping. 7. Do not keep things on stairs or in the halls.  Remove small furniture that blocks the halls as it may cause you to trip.  Keep telephone and electrical cords out of the way where you walk. 8. Always were sturdy, rubber-soled shoes for good support.  Never wear just socks, especially on the stairs.  Socks may cause you to slip or fall.  Do not wear full-length housecoats as you can easily trip on the bottom.  9. Place the things you use the most on the shelves that are the easiest to reach.  If you use a stepstool, make sure it is in good condition.  If you feel unsteady, DO NOT climb, ask for help. 10. If a health professional advises you to use a cane or walker, do not be ashamed.  These items can keep you from falling and breaking your bones.

## 2017-07-21 ENCOUNTER — Ambulatory Visit: Payer: Medicare Other | Admitting: Family Medicine

## 2017-07-26 ENCOUNTER — Ambulatory Visit: Payer: Self-pay

## 2017-07-26 ENCOUNTER — Encounter: Payer: Self-pay | Admitting: Family Medicine

## 2017-07-26 ENCOUNTER — Ambulatory Visit: Payer: Medicare Other | Admitting: Family Medicine

## 2017-07-26 VITALS — BP 160/79 | HR 95 | Ht 66.0 in | Wt 163.0 lb

## 2017-07-26 DIAGNOSIS — M1712 Unilateral primary osteoarthritis, left knee: Secondary | ICD-10-CM

## 2017-07-26 DIAGNOSIS — G8929 Other chronic pain: Secondary | ICD-10-CM

## 2017-07-26 DIAGNOSIS — M7062 Trochanteric bursitis, left hip: Secondary | ICD-10-CM

## 2017-07-26 DIAGNOSIS — M25562 Pain in left knee: Principal | ICD-10-CM

## 2017-07-26 DIAGNOSIS — R634 Abnormal weight loss: Secondary | ICD-10-CM | POA: Diagnosis not present

## 2017-07-26 DIAGNOSIS — M7061 Trochanteric bursitis, right hip: Secondary | ICD-10-CM | POA: Insufficient documentation

## 2017-07-26 NOTE — Assessment & Plan Note (Signed)
Weight has seemed to stabilized.  Patient denies any fevers or chills

## 2017-07-26 NOTE — Patient Instructions (Signed)
Good to see you  Ice 20 minutes 2 times daily. Usually after activity and before bed. pennsaid pinkie amount topically 2 times daily as needed.  Stay active Injected the hip and the knee and it hopes See me again in 4-6 weeks

## 2017-07-26 NOTE — Progress Notes (Signed)
[name] [address], [place] [address] Phone: [phone] Subjective:    I'm seeing this patient by the request  of:    CC: Left hip and knee pain  JIR:[id number]  Kristina Ayala is a 72 y.o. female coming in with complaint of left hip and knee pain.  Has had knee arthritis previously.  Has injected left knee greater than 4 months ago and patient did do very well.  Started having increasing discomfort and pain again, starting to give her increasing instability.  Patient states that it can even wake her up at night.  Patient is also having left hip pain, seems to be lateral.  States that it seems different than her back.  Has had a nerve root injection back in August.  Feels like this is different.  Waking her up at night and only seems to hurt after sitting for long amount of time.     Past Medical History:  Diagnosis Date  . Arthritis   . Bursitis    left elbow  . Clostridium difficile infection   . Frequent falls   . HLD (hyperlipidemia)   . Hypertension   . Infectious colitis    Past Surgical History:  Procedure Laterality Date  . COLONOSCOPY    . ORIF PROXIMAL TIBIAL PLATEAU FRACTURE Left 02/19/2012   knee  . POLYPECTOMY    . TONSILLECTOMY     Social History   Socioeconomic History  . Marital status: Divorced    Spouse name: Not on file  . Number of children: 2  . Years of education: Not on file  . Highest education level: Not on file  Occupational History  . Occupation: RETIRED  Social Needs  . Financial resource strain: Not hard at all  . Food insecurity:    Worry: Never true    Inability: Never true  . Transportation needs:    Medical: No    Non-medical: No  Tobacco Use  . Smoking status: Former Smoker    Packs/day: 0.25    Years: 50.00    Pack years: 12.50    Types: Cigarettes    Last attempt to quit: 08/28/2014    Years since quitting: 2.9  . Smokeless tobacco: Never Used  . Tobacco comment: 1 pack  every 3 days  Substance and Sexual Activity  . Alcohol use: No    Alcohol/week: 0.0 oz  . Drug use: No  . Sexual activity: Not Currently  Lifestyle  . Physical activity:    Days per week: 4 days    Minutes per session: 40 min  . Stress: Not at all  Relationships  . Social connections:    Talks on phone: More than three times a week    Gets together: More than three times a week    Attends religious service: More than 4 times per year    Active member of club or organization: Yes    Attends meetings of clubs or organizations: More than 4 times per year    Relationship status: Not on file  Other Topics Concern  . Not on file  Social History Narrative   Lives alone has 2 supportive children.      Allergies  Allergen Reactions  . Doxycycline Diarrhea  . Minocycline Diarrhea  . Wellbutrin [Bupropion] Swelling    Per pt her tongue was swollen and sore/symptoms stopped once the medication was discontinued.   [name] Penicillins Rash    Has patient had a PCN  reaction causing immediate rash, facial/tongue/throat swelling, SOB or lightheadedness with hypotension: yes Has patient had a PCN reaction causing severe rash involving mucus membranes or skin necrosis: no Has patient had a PCN reaction that required hospitalization: unknown Has patient had a PCN reaction occurring within the last 10 years: no If all of the above answers are "NO", then may proceed with Cephalosporin use.    Family History  Problem Relation Age of Onset  . Diverticulosis Mother   . Ovarian cancer Mother   . Pancreatic cancer Father   . Lung cancer Father   . Colon polyps Sister   . Colon cancer Neg Hx      Past medical history, social, surgical and family history all reviewed in electronic medical record.  No pertanent information unless stated regarding to the chief complaint.   Review of Systems:Review of systems updated and as accurate as of 07/26/17  No headache, visual changes, nausea, vomiting,  diarrhea, constipation, dizziness, abdominal pain, skin rash, fevers, chills, night sweats, weight loss, swollen lymph nodes, body aches,  chest pain, shortness of breath, mood changes.  Positive muscle aches and joint swelling  Objective  Blood pressure (!) 160/79, pulse 95, height 5\' 6"  (1.676 m), weight 163 lb (73.9 kg), SpO2 97 %. Systems examined below as of 07/26/17   General: No apparent distress alert and oriented x3 mood and affect normal, dressed appropriately.  HEENT: Pupils equal, extraocular movements intact  Respiratory: Patient's speak in full sentences and does not appear short of breath  Cardiovascular: No lower extremity edema, non tender, no erythema  Skin: Warm dry intact with no signs of infection or rash on extremities or on axial skeleton.  Abdomen: Soft nontender  Neuro: Cranial nerves II through XII are intact, neurovascularly intact in all extremities with 2+ DTRs and 2+ pulses.  Lymph: No lymphadenopathy of posterior or anterior cervical chain or axillae bilaterally.  Gait antalgic gait MSK:  Non tender with full range of motion and good stability and symmetric strength and tone of shoulders, elbows, wrist,  and ankles bilaterally.  Left hip exam has near full range of motion but positive Corky Sox with severe tenderness to palpation over the lateral greater trochanteric area.  Knee: Left valgus deformity noted.  Abnormal thigh to calf ratio.  Postsurgical changes noted as well in the lateral aspect of the knee Tender to palpation over medial and PF joint line.  ROM full in flexion and extension and lower leg rotation. instability with valgus force.  painful patellar compression. Patellar glide with moderate crepitus. Patellar and quadriceps tendons unremarkable. Hamstring and quadriceps strength is normal. Contralateral knee shows mild arthritic changes.  After informed written and verbal consent, patient was seated on exam table. Left knee was prepped with  alcohol swab and utilizing anterolateral approach, patient's left knee space was injected with 4:1  marcaine 0.5%: Kenalog 40mg /dL. Patient tolerated the procedure well without immediate complications.   Procedure: Real-time Ultrasound Guided Injection of left greater trochanteric bursitis secondary to patient's body habitus Device: GE Logiq Q7 Ultrasound guided injection is preferred based studies that show increased duration, increased effect, greater accuracy, decreased procedural pain, increased response rate, and decreased cost with ultrasound guided versus blind injection.  Verbal informed consent obtained.  Time-out conducted.  Noted no overlying erythema, induration, or other signs of local infection.  Skin prepped in a sterile fashion.  Local anesthesia: Topical Ethyl chloride.  With sterile technique and under real time ultrasound guidance:  Greater trochanteric area was visualized  and patient's bursa was noted. A 22-gauge 3 inch needle was inserted and 4 cc of 0.5% Marcaine and 1 cc of Kenalog 40 mg/dL was injected. Pictures taken Completed without difficulty  Pain immediately resolved suggesting accurate placement of the medication.  Advised to call if fevers/chills, erythema, induration, drainage, or persistent bleeding.  Images permanently stored and available for review in the ultrasound unit.  Impression: Technically successful ultrasound guided injection.   Impression and Recommendations:     This case required medical decision making of moderate complexity.      Note: This dictation was prepared with Dragon dictation along with smaller phrase technology. Any transcriptional errors that result from this process are unintentional.

## 2017-07-26 NOTE — Assessment & Plan Note (Signed)
Left greater trochanteric bursitis.  Given injection hopefully be beneficial.  Still concern for the L5 nerve root impingement likely contributing to some of the discomfort and pain.  Patient will try conservative therapy, topical anti-inflammatories and icing.  Declined formal physical therapy for this.  Is doing some for her back.  Follow-up again in 4 to 8 weeks

## 2017-07-26 NOTE — Assessment & Plan Note (Signed)
Another injection given today.  Tolerated procedure well.  We discussed again about the possibility of surgical intervention which patient has declined.  Patient has had a history of surgery in this area previously.  We discussed repeating x-rays which patient also declined as well as formal physical therapy.  Could be a candidate for Visco supplementation will try to get approval.  Follow-up again in 4 to 8 weeks

## 2017-08-04 ENCOUNTER — Other Ambulatory Visit: Payer: Self-pay | Admitting: Internal Medicine

## 2017-08-29 ENCOUNTER — Telehealth: Payer: Self-pay | Admitting: Internal Medicine

## 2017-08-29 ENCOUNTER — Other Ambulatory Visit: Payer: Self-pay

## 2017-08-29 MED ORDER — ATORVASTATIN CALCIUM 40 MG PO TABS: 20.0000 mg | ORAL_TABLET | Freq: Every morning | ORAL | 0 refills | Status: DC

## 2017-08-29 NOTE — Patient Outreach (Signed)
[facility]) Care Management  08/29/2017  Kristina Ayala [birth year] [id number]   Medication Adherence call to Kristina Ayala spoke with patient she said she is only taking half of tablet of Atorvastatin 40 mg. because one of her doctor's told her to just take half of tablet. call doctor Burns her primary care doctor left a message to send in a new prescription stating how she is taking it now. Kristina Ayala is showing past due under [place].   [place] Management Direct Dial [phone]  Fax [phone] [email or web address]

## 2017-08-29 NOTE — Telephone Encounter (Signed)
Is this okay to continue?

## 2017-08-29 NOTE — Telephone Encounter (Signed)
yes

## 2017-08-29 NOTE — Telephone Encounter (Signed)
Copied from [place] [phone]. Topic: Quick Communication - See Telephone Encounter >> Aug 29, 2017 10:49 AM Burchel, Abbi R wrote: CRM for notification. See Telephone encounter for: 08/29/17.  Anna: [facility]   Per pt, she is only taking 1/2 tab of her atorvastatin and would like to continue doing so.  Can Dr. Quay Burow update the rx to reflect that?  Cb: [phone]

## 2017-08-29 NOTE — Progress Notes (Signed)
[facility] [place],  [address] Phone: [phone] Subjective:      CC: Knee pain  TDV:[id number]  Kristina Ayala is a 72 y.o. female coming in with complaint of knee pain. States her knee is better than the last visit. Patient was found to have some arthritis.  Continues to lose weight and is attempting to.  Left knee feeling significantly better at this time.  Nothing severe.  No significant increase in instability.  Uses a brace intermittently.    Past Medical History:  Diagnosis Date  . Arthritis   . Bursitis    left elbow  . Clostridium difficile infection   . Frequent falls   . HLD (hyperlipidemia)   . Hypertension   . Infectious colitis    Past Surgical History:  Procedure Laterality Date  . COLONOSCOPY    . ORIF PROXIMAL TIBIAL PLATEAU FRACTURE Left 02/19/2012   knee  . POLYPECTOMY    . TONSILLECTOMY     Social History   Socioeconomic History  . Marital status: Divorced    Spouse name: Not on file  . Number of children: 2  . Years of education: Not on file  . Highest education level: Not on file  Occupational History  . Occupation: RETIRED  Social Needs  . Financial resource strain: Not hard at all  . Food insecurity:    Worry: Never true    Inability: Never true  . Transportation needs:    Medical: No    Non-medical: No  Tobacco Use  . Smoking status: Former Smoker    Packs/day: 0.25    Years: 50.00    Pack years: 12.50    Types: Cigarettes    Last attempt to quit: 08/28/2014    Years since quitting: 3.0  . Smokeless tobacco: Never Used  . Tobacco comment: 1 pack every 3 days  Substance and Sexual Activity  . Alcohol use: No    Alcohol/week: 0.0 oz  . Drug use: No  . Sexual activity: Not Currently  Lifestyle  . Physical activity:    Days per week: 4 days    Minutes per session: 40 min  . Stress: Not at all  Relationships  . Social connections:    Talks on phone: More than three times  a week    Gets together: More than three times a week    Attends religious service: More than 4 times per year    Active member of club or organization: Yes    Attends meetings of clubs or organizations: More than 4 times per year    Relationship status: Not on file  Other Topics Concern  . Not on file  Social History Narrative   Lives alone has 2 supportive children.      Allergies  Allergen Reactions  . Doxycycline Diarrhea  . Minocycline Diarrhea  . Wellbutrin [Bupropion] Swelling    Per pt her tongue was swollen and sore/symptoms stopped once the medication was discontinued.   [name] Penicillins Rash    Has patient had a PCN reaction causing immediate rash, facial/tongue/throat swelling, SOB or lightheadedness with hypotension: yes Has patient had a PCN reaction causing severe rash involving mucus membranes or skin necrosis: no Has patient had a PCN reaction that required hospitalization: unknown Has patient had a PCN reaction occurring within the last 10 years: no If all of the above answers are "NO", then may proceed with Cephalosporin use.    Family History  Problem  Relation Age of Onset  . Diverticulosis Mother   . Ovarian cancer Mother   . Pancreatic cancer Father   . Lung cancer Father   . Colon polyps Sister   . Colon cancer Neg Hx      Past medical history, social, surgical and family history all reviewed in electronic medical record.  No pertanent information unless stated regarding to the chief complaint.   Review of Systems:Review of systems updated and as accurate as of 08/30/17  No headache, visual changes, nausea, vomiting, diarrhea, constipation, dizziness, abdominal pain, skin rash, fevers, chills, night sweats, weight loss, swollen lymph nodes, body aches, joint swelling, chest pain, shortness of breath, mood changes.  Mild positive muscle aches  Objective  Blood pressure 130/90, pulse 85, height 5\' 6"  (1.676 m), weight 165 lb (74.8 kg), SpO2 96 %. Systems  examined below as of 08/30/17   General: No apparent distress alert and oriented x3 mood and affect normal, dressed appropriately.  HEENT: Pupils equal, extraocular movements intact  Respiratory: Patient's speak in full sentences and does not appear short of breath  Cardiovascular: No lower extremity edema, non tender, no erythema  Skin: Warm dry intact with no signs of infection or rash on extremities or on axial skeleton.  Abdomen: Soft nontender  Neuro: Cranial nerves II through XII are intact, neurovascularly intact in all extremities with 2+ DTRs and 2+ pulses.  Lymph: No lymphadenopathy of posterior or anterior cervical chain or axillae bilaterally.  Gait mild antalgic MSK:   tender with near full range of motion and good stability and symmetric strength and tone of shoulders, elbows, wrist, hip, and ankles bilaterally.  Knee: Left valgus deformity noted. Large thigh to calf ratio.  Tender to palpation over medial and PF joint line less than previous exam.  ROM full in flexion and extension and lower leg rotation. instability with valgus force.  Mild painful patellar compression. Patellar glide with mild crepitus. Patellar and quadriceps tendons unremarkable. Hamstring and quadriceps strength is normal. Contralateral knee shows mild arthritis   Impression and Recommendations:     This case required medical decision making of moderate complexity.      Note: This dictation was prepared with Dragon dictation along with smaller phrase technology. Any transcriptional errors that result from this process are unintentional.

## 2017-08-30 ENCOUNTER — Ambulatory Visit: Payer: Medicare Other | Admitting: Family Medicine

## 2017-08-30 ENCOUNTER — Encounter: Payer: Self-pay | Admitting: Family Medicine

## 2017-08-30 DIAGNOSIS — M1712 Unilateral primary osteoarthritis, left knee: Secondary | ICD-10-CM

## 2017-08-30 NOTE — Assessment & Plan Note (Signed)
Doing well with conservative therapy.  No need for Visco supplementation.  Discussed icing regimen and home exercise.  Discussed which activities to do which wants to avoid.  Follow-up again in 4 to 6 weeks

## 2017-09-12 ENCOUNTER — Telehealth: Payer: Self-pay | Admitting: *Deleted

## 2017-09-12 NOTE — Telephone Encounter (Signed)
I would need to see her to evaluate properly  If she would like could get xray and then give pennsaid first and give it 2-3 weeks but if not better we probably should see her again

## 2017-09-12 NOTE — Telephone Encounter (Signed)
Discussed with pt.  She would like to try the pennsaid first. Pt states if she is still having pain after using the pennsaid she will call back.

## 2017-09-12 NOTE — Telephone Encounter (Signed)
Copied from [place] [id number]. Topic: Inquiry >> Sep 12, 2017  8:57 AM Oliver Pila B wrote: Reason for CRM: pt called to get assistance about the cyst that she was seen for; contact to advise

## 2017-09-12 NOTE — Telephone Encounter (Signed)
lmovm for pt to return call.  

## 2017-09-12 NOTE — Telephone Encounter (Signed)
Pt called stating that when she was seen on 8.6.19 Dr. Tamala Julian did an [place] on a "knot on her shin." Pt states it now "feels like someone has kicked her in the shin." I offered pt an appointment but she states she is unable to afford a co-pay at this time. Pt would like to know if there is anything she can do at home to help with the pain.

## 2017-09-23 ENCOUNTER — Encounter: Payer: Medicare Other | Admitting: Internal Medicine

## 2017-10-04 ENCOUNTER — Other Ambulatory Visit: Payer: Self-pay | Admitting: Internal Medicine

## 2017-10-07 ENCOUNTER — Telehealth: Payer: Self-pay | Admitting: Internal Medicine

## 2017-10-07 NOTE — Telephone Encounter (Signed)
Insurance has been submitted and verified for Prolia. Patient is responsible for a $260 copay. She has previously used [employer] and will call them back to re-enroll. Once this has been done, she will need to script sent to the pharmacy for her to pick up and bring in to her appointment. She is scheduled for 01/04/2018. Patient will call to let us know when she has the information from [facility].

## 2017-10-11 ENCOUNTER — Ambulatory Visit: Payer: Self-pay | Admitting: *Deleted

## 2017-10-11 NOTE — Telephone Encounter (Signed)
Copied from [place] [phone]. Topic: Quick Communication - See Telephone Encounter >> Oct 11, 2017 12:12 PM Antonieta Iba C wrote: CRM for notification. See Telephone encounter for: 10/11/17.   Pt called in to request a call back from [facility] in regards to the prolia injection   CB: [phone]  I have talked with patient, she was needing osteoporosis codes for [employer]---It will be J0897 or M81.0---pt to call [employer] back to give codes and then call our office back when approved thru [employer] patient assistance

## 2017-10-11 NOTE — Telephone Encounter (Signed)
Contacted pt regarding symptoms; she complains of ongoing left knee pain and swelling;she says that she has been applying PENSAID samples that Dr Tamala Julian gave her but it only relieves her pain "for about an hour" after applying it; pt last seen by Dr Hulan Saas, [facility], on 08/30/17; recommendations made per nurse triage to include seeing a physician within 24 hours; confirmed with [place] at [facility] that Dr Tamala Julian has no availability per guidelines; pt offered and accepted appointment with Dr Clearance Coots, [facility], 10/12/17 at 0830; she verbalizes understanding; also cancelled previously scheduled appointment for 09/12/17; will route to office for notification of this upcoming appointment.   Reason for Disposition . [1] MODERATE leg swelling (e.g., swelling extends up to knees) AND [2] new onset or worsening  Answer Assessment - Initial Assessment Questions 1. ONSET: "When did the swelling start?" (e.g., minutes, hours, days)     Ongoing; seen by Dr Charlann Boxer on 08/30/17 2. LOCATION: "What part of the leg is swollen?"  "Are both legs swollen or just one leg?"   Left knee 3. SEVERITY: "How bad is the swelling?" (e.g., localized; mild, moderate, severe)  - Localized - small area of swelling localized to one leg  - MILD pedal edema - swelling limited to foot and ankle, pitting edema < 1/4 inch (6 mm) deep, rest and elevation eliminate most or all swelling  - MODERATE edema - swelling of lower leg to knee, pitting edema > 1/4 inch (6 mm) deep, rest and elevation only partially reduce swelling  - SEVERE edema - swelling extends above knee, facial or hand swelling present     moderate 4. REDNESS: "Does the swelling look red or infected?"    no 5. PAIN: "Is the swelling painful to touch?" If so, ask: "How painful is it?"   (Scale 1-10; mild, moderate or severe)     Rated 6 out of 10 6. FEVER: "Do you have a fever?" If so, ask: "What is it, how was it measured, and when did it start?"      no 7.  CAUSE: "What do you think is causing the leg swelling?"     Previous injury; ongoing problems 2014 8. MEDICAL HISTORY: "Do you have a history of heart failure, kidney disease, liver failure, or cancer?"     no 9. RECURRENT SYMPTOM: "Have you had leg swelling before?" If so, ask: "When was the last time?" "What happened that time?"     Yes; previously seen by Dr Charlann Boxer 08/30/17 10. OTHER SYMPTOMS: "Do you have any other symptoms?" (e.g., chest pain, difficulty breathing)       Difficult to sleep because of pain 11. PREGNANCY: "Is there any chance you are pregnant?" "When was your last menstrual period?"       no  Protocols used: LEG SWELLING AND EDEMA-A-AH

## 2017-10-12 ENCOUNTER — Telehealth: Payer: Self-pay | Admitting: Urgent Care

## 2017-10-12 ENCOUNTER — Encounter: Payer: Self-pay | Admitting: Family Medicine

## 2017-10-12 ENCOUNTER — Ambulatory Visit (INDEPENDENT_AMBULATORY_CARE_PROVIDER_SITE_OTHER): Payer: Medicare Other | Admitting: Family Medicine

## 2017-10-12 VITALS — BP 128/76 | HR 90 | Wt 167.0 lb

## 2017-10-12 DIAGNOSIS — M5416 Radiculopathy, lumbar region: Secondary | ICD-10-CM | POA: Diagnosis not present

## 2017-10-12 DIAGNOSIS — M79605 Pain in left leg: Secondary | ICD-10-CM | POA: Diagnosis not present

## 2017-10-12 DIAGNOSIS — M1712 Unilateral primary osteoarthritis, left knee: Secondary | ICD-10-CM

## 2017-10-12 DIAGNOSIS — M79604 Pain in right leg: Secondary | ICD-10-CM

## 2017-10-12 DIAGNOSIS — G2581 Restless legs syndrome: Secondary | ICD-10-CM | POA: Insufficient documentation

## 2017-10-12 MED ORDER — PREDNISONE 5 MG PO TABS: ORAL_TABLET | ORAL | 0 refills | Status: DC

## 2017-10-12 NOTE — Assessment & Plan Note (Signed)
Reports she has pain in both legs and reports a history of diagnosis of restless leg syndrome.  No anemia on review of her lab work.  Could be associated with low iron. -Iron and ferritin.

## 2017-10-12 NOTE — Progress Notes (Signed)
Kristina Ayala - 72 y.o. female MRN [id number]  Date of birth: 1945-10-18  SUBJECTIVE:  Including CC & ROS.  Chief Complaint  Patient presents with  . Knee Pain    Kristina Ayala is a 72 y.o. female that is presenting with left knee pain. Pain has been ongoing chronically. She has received steroid injections in the past, her last injection was on 07/26/17 with improvement with her pain. Pain is mild to severe with extension.  She feels the pain on the anterior aspect of her knee as well as the anterior proximal aspect of the tibia. Denies swelling, warmth, or redness.  Denies any fall or trauma.  She carries a cane with her but not use it often.  She has completed physical therapy. She has been applying Pennsaid. History of Orif Proximal Tibial Plateau fracture in 2014.   Also having pain on the lateral aspect of her left leg that extends from her thigh to her lateral malleolus.  She does feel like the plantar aspect of her foot has some numbness at times.  She received a greater trochanter in July as well but there was thought she may have nerve impingement.  She denies any history of back surgery.  Pain seem to be staying the same.  Has leg pain and reports having history of restless leg syndrome.  Reports that she needs to be taken iron but does not take this on a regular basis.  She feels like moving her legs at night also exacerbates the pain in her knee.  Independent review of the left knee x-ray from 2018 shows fairly well-maintained joint line but postsurgical changes.   Review of Systems  Constitutional: Negative for fever.  HENT: Negative for congestion.   Respiratory: Negative for cough.   Cardiovascular: Negative for chest pain.  Gastrointestinal: Negative for abdominal pain.  Musculoskeletal: Positive for gait problem.  Skin: Negative for color change.  Neurological: Negative for weakness.  Hematological: Negative for adenopathy.  Psychiatric/Behavioral: Negative for  agitation.    HISTORY: Past Medical, Surgical, Social, and Family History Reviewed & Updated per EMR.   Pertinent Historical Findings include:  Past Medical History:  Diagnosis Date  . Arthritis   . Bursitis    left elbow  . Clostridium difficile infection   . Frequent falls   . HLD (hyperlipidemia)   . Hypertension   . Infectious colitis     Past Surgical History:  Procedure Laterality Date  . COLONOSCOPY    . ORIF PROXIMAL TIBIAL PLATEAU FRACTURE Left 02/19/2012   knee  . POLYPECTOMY    . TONSILLECTOMY      Allergies  Allergen Reactions  . Doxycycline Diarrhea  . Minocycline Diarrhea  . Wellbutrin [Bupropion] Swelling    Per pt her tongue was swollen and sore/symptoms stopped once the medication was discontinued.   [name] Penicillins Rash    Has patient had a PCN reaction causing immediate rash, facial/tongue/throat swelling, SOB or lightheadedness with hypotension: yes Has patient had a PCN reaction causing severe rash involving mucus membranes or skin necrosis: no Has patient had a PCN reaction that required hospitalization: unknown Has patient had a PCN reaction occurring within the last 10 years: no If all of the above answers are "NO", then may proceed with Cephalosporin use.     Family History  Problem Relation Age of Onset  . Diverticulosis Mother   . Ovarian cancer Mother   . Pancreatic cancer Father   . Lung cancer Father   .  Colon polyps Sister   . Colon cancer Neg Hx      Social History   Socioeconomic History  . Marital status: Divorced    Spouse name: Not on file  . Number of children: 2  . Years of education: Not on file  . Highest education level: Not on file  Occupational History  . Occupation: RETIRED  Social Needs  . Financial resource strain: Not hard at all  . Food insecurity:    Worry: Never true    Inability: Never true  . Transportation needs:    Medical: No    Non-medical: No  Tobacco Use  . Smoking status: Former Smoker     Packs/day: 0.25    Years: 50.00    Pack years: 12.50    Types: Cigarettes    Last attempt to quit: 08/28/2014    Years since quitting: 3.1  . Smokeless tobacco: Never Used  . Tobacco comment: 1 pack every 3 days  Substance and Sexual Activity  . Alcohol use: No    Alcohol/week: 0.0 standard drinks  . Drug use: No  . Sexual activity: Not Currently  Lifestyle  . Physical activity:    Days per week: 4 days    Minutes per session: 40 min  . Stress: Not at all  Relationships  . Social connections:    Talks on phone: More than three times a week    Gets together: More than three times a week    Attends religious service: More than 4 times per year    Active member of club or organization: Yes    Attends meetings of clubs or organizations: More than 4 times per year    Relationship status: Not on file  . Intimate partner violence:    Fear of current or ex partner: Not on file    Emotionally abused: Not on file    Physically abused: Not on file    Forced sexual activity: Not on file  Other Topics Concern  . Not on file  Social History Narrative   Lives alone has 2 supportive children.        PHYSICAL EXAM:  VS: BP 128/76   Pulse 90   Wt 167 lb (75.8 kg)   SpO2 90%   BMI 26.95 kg/m  Physical Exam Gen: NAD, alert, cooperative with exam, well-appearing ENT: normal lips, normal nasal mucosa,  Eye: normal EOM, normal conjunctiva and lids CV:  no edema, +2 pedal pulses   Resp: no accessory muscle use, non-labored,  Skin: no rashes, no areas of induration  Neuro: normal tone, normal sensation to touch Psych:  normal insight, alert and oriented MSK:  Left knee:  No obvious effusion. Normal flexion and extension. Normal strength resistance. Instability with valgus force. Well-healed vertical incisional lateral aspect of the knee. Tender palpation over the proximal tibia and medial joint line. Left hip/back:  No significant tenderness over the greater trochanter. Normal  internal and external rotation of the hip. Negative straight leg raise bilaterally. Walking with a limp and using a four-point cane. Neurovascular intact     ASSESSMENT & PLAN:   Lumbar radiculopathy I feel the pain in the left leg itself is more radicular in nature today.  May be associated with the abnormal gait since her knee is been bothering her lately. -Prednisone -Counseled supportive care. -If no improvement would consider imaging and physical therapy.  Degenerative arthritis of left knee Pain seems to be associated with the anterior aspect of the knee and  the proximal tibia.  Do not appreciate any loosening components with exam today.  Could be biomechanical as she has some weakness which may be attributing to the knee pain in the form of patellofemoral syndrome. -Counseled on home exercise therapy. -Counseled on supportive care. -If no improvement may need to consider CT scan or bone scan to check for any loosening components.  Pain in both lower extremities Reports she has pain in both legs and reports a history of diagnosis of restless leg syndrome.  No anemia on review of her lab work.  Could be associated with low iron. -Iron and ferritin.

## 2017-10-12 NOTE — Patient Instructions (Signed)
Nice to meet you  Please try the medication  Please try the exercises Please try ice on the knee  Please follow up if your pain doesn't seem to be improving.

## 2017-10-12 NOTE — Assessment & Plan Note (Signed)
I feel the pain in the left leg itself is more radicular in nature today.  May be associated with the abnormal gait since her knee is been bothering her lately. -Prednisone -Counseled supportive care. -If no improvement would consider imaging and physical therapy.

## 2017-10-12 NOTE — Assessment & Plan Note (Signed)
Pain seems to be associated with the anterior aspect of the knee and the proximal tibia.  Do not appreciate any loosening components with exam today.  Could be biomechanical as she has some weakness which may be attributing to the knee pain in the form of patellofemoral syndrome. -Counseled on home exercise therapy. -Counseled on supportive care. -If no improvement may need to consider CT scan or bone scan to check for any loosening components.

## 2017-10-13 ENCOUNTER — Ambulatory Visit: Payer: Medicare Other | Admitting: Family Medicine

## 2017-10-13 ENCOUNTER — Other Ambulatory Visit: Payer: Self-pay

## 2017-10-13 MED ORDER — MELOXICAM 7.5 MG PO TABS: 7.5000 mg | ORAL_TABLET | Freq: Every day | ORAL | 0 refills | Status: DC

## 2017-10-13 NOTE — Telephone Encounter (Signed)
Does patient need appt?

## 2017-10-13 NOTE — Telephone Encounter (Signed)
Patient would like a refill on the Meloxicam 7.5 MG medication.  She originally received the prescription from [facility] but she only went to him because [employer] was closed. The medication worked very well.  So she is requesting a refill from Dr. Hulan Saas.  Please send to her preferred pharmacy [facility] on [address] and [address].

## 2017-10-17 NOTE — Telephone Encounter (Signed)
rx was sent into pharmacy.

## 2017-10-27 NOTE — Patient Instructions (Addendum)
[facility] ophthalmology, [facility], [facility] Ophthalmology or [facility] Ophthalmology.    [facility]  [address]  [place]  [place],  [address]  Main: [phone]    Tests ordered today. Your results will be released to [place] (or called to you) after review, usually within 72hours after test completion. If any changes need to be made, you will be notified at that same time.  All other Health Maintenance issues reviewed.   All recommended immunizations and age-appropriate screenings are up-to-date or discussed.  Flu immunization administered today.    Medications reviewed and updated.  Changes include :   none   A  Thyroid ultrasound was ordered - someone will call you to set that up.   Please followup in 6 months   Health Maintenance, Female Adopting a healthy lifestyle and getting preventive care can go a long way to promote health and wellness. Talk with your health care provider about what schedule of regular examinations is right for you. This is a good chance for you to check in with your provider about disease prevention and staying healthy. In between checkups, there are plenty of things you can do on your own. Experts have done a lot of research about which lifestyle changes and preventive measures are most likely to keep you healthy. Ask your health care provider for more information. Weight and diet Eat a healthy diet  Be sure to include plenty of vegetables, fruits, low-fat dairy products, and lean protein.  Do not eat a lot of foods high in solid fats, added sugars, or salt.  Get regular exercise. This is one of the most important things you can do for your health. ? Most adults should exercise for at least 150 minutes each week. The exercise should increase your heart rate and make you sweat (moderate-intensity exercise). ? Most adults should also do strengthening exercises at least twice a week. This is in addition to the  moderate-intensity exercise.  Maintain a healthy weight  Body mass index (BMI) is a measurement that can be used to identify possible weight problems. It estimates body fat based on height and weight. Your health care provider can help determine your BMI and help you achieve or maintain a healthy weight.  For females 53 years of age and older: ? A BMI below 18.5 is considered underweight. ? A BMI of 18.5 to 24.9 is normal. ? A BMI of 25 to 29.9 is considered overweight. ? A BMI of 30 and above is considered obese.  Watch levels of cholesterol and blood lipids  You should start having your blood tested for lipids and cholesterol at 73 years of age, then have this test every 5 years.  You may need to have your cholesterol levels checked more often if: ? Your lipid or cholesterol levels are high. ? You are older than 73 years of age. ? You are at high risk for heart disease.  Cancer screening Lung Cancer  Lung cancer screening is recommended for adults 73-20 years old who are at high risk for lung cancer because of a history of smoking.  A yearly low-dose CT scan of the lungs is recommended for people who: ? Currently smoke. ? Have quit within the past 15 years. ? Have at least a 30-pack-year history of smoking. A pack year is smoking an average of one pack of cigarettes a day for 1 year.  Yearly screening should continue until it has been 15 years since you quit.  Yearly  screening should stop if you develop a health problem that would prevent you from having lung cancer treatment.  Breast Cancer  Practice breast self-awareness. This means understanding how your breasts normally appear and feel.  It also means doing regular breast self-exams. Let your health care provider know about any changes, no matter how small.  If you are in your 20s or 30s, you should have a clinical breast exam (CBE) by a health care provider every 1-3 years as part of a regular health exam.  If you  are 73 or older, have a CBE every year. Also consider having a breast X-ray (mammogram) every year.  If you have a family history of breast cancer, talk to your health care provider about genetic screening.  If you are at high risk for breast cancer, talk to your health care provider about having an MRI and a mammogram every year.  Breast cancer gene (BRCA) assessment is recommended for women who have family members with BRCA-related cancers. BRCA-related cancers include: ? Breast. ? Ovarian. ? Tubal. ? Peritoneal cancers.  Results of the assessment will determine the need for genetic counseling and BRCA1 and BRCA2 testing.  Cervical Cancer Your health care provider may recommend that you be screened regularly for cancer of the pelvic organs (ovaries, uterus, and vagina). This screening involves a pelvic examination, including checking for microscopic changes to the surface of your cervix (Pap test). You may be encouraged to have this screening done every 3 years, beginning at age 61.  For women ages 90-65, health care providers may recommend pelvic exams and Pap testing every 3 years, or they may recommend the Pap and pelvic exam, combined with testing for human papilloma virus (HPV), every 5 years. Some types of HPV increase your risk of cervical cancer. Testing for HPV may also be done on women of any age with unclear Pap test results.  Other health care providers may not recommend any screening for nonpregnant women who are considered low risk for pelvic cancer and who do not have symptoms. Ask your health care provider if a screening pelvic exam is right for you.  If you have had past treatment for cervical cancer or a condition that could lead to cancer, you need Pap tests and screening for cancer for at least 20 years after your treatment. If Pap tests have been discontinued, your risk factors (such as having a new sexual partner) need to be reassessed to determine if screening should  resume. Some women have medical problems that increase the chance of getting cervical cancer. In these cases, your health care provider may recommend more frequent screening and Pap tests.  Colorectal Cancer  This type of cancer can be detected and often prevented.  Routine colorectal cancer screening usually begins at 73 years of age and continues through 73 years of age.  Your health care provider may recommend screening at an earlier age if you have risk factors for colon cancer.  Your health care provider may also recommend using home test kits to check for hidden blood in the stool.  A small camera at the end of a tube can be used to examine your colon directly (sigmoidoscopy or colonoscopy). This is done to check for the earliest forms of colorectal cancer.  Routine screening usually begins at age 67.  Direct examination of the colon should be repeated every 5-10 years through 73 years of age. However, you may need to be screened more often if early forms of precancerous polyps  or small growths are found.  Skin Cancer  Check your skin from head to toe regularly.  Tell your health care provider about any new moles or changes in moles, especially if there is a change in a mole's shape or color.  Also tell your health care provider if you have a mole that is larger than the size of a pencil eraser.  Always use sunscreen. Apply sunscreen liberally and repeatedly throughout the day.  Protect yourself by wearing long sleeves, pants, a wide-brimmed hat, and sunglasses whenever you are outside.  Heart disease, diabetes, and high blood pressure  High blood pressure causes heart disease and increases the risk of stroke. High blood pressure is more likely to develop in: ? People who have blood pressure in the high end of the normal range (130-139/85-89 mm Hg). ? People who are overweight or obese. ? People who are African American.  If you are 18-39 years of age, have your blood  pressure checked every 3-5 years. If you are 40 years of age or older, have your blood pressure checked every year. You should have your blood pressure measured twice-once when you are at a hospital or clinic, and once when you are not at a hospital or clinic. Record the average of the two measurements. To check your blood pressure when you are not at a hospital or clinic, you can use: ? An automated blood pressure machine at a pharmacy. ? A home blood pressure monitor.  If you are between 55 years and 79 years old, ask your health care provider if you should take aspirin to prevent strokes.  Have regular diabetes screenings. This involves taking a blood sample to check your fasting blood sugar level. ? If you are at a normal weight and have a low risk for diabetes, have this test once every three years after 73 years of age. ? If you are overweight and have a high risk for diabetes, consider being tested at a younger age or more often. Preventing infection Hepatitis B  If you have a higher risk for hepatitis B, you should be screened for this virus. You are considered at high risk for hepatitis B if: ? You were born in a country where hepatitis B is common. Ask your health care provider which countries are considered high risk. ? Your parents were born in a high-risk country, and you have not been immunized against hepatitis B (hepatitis B vaccine). ? You have HIV or AIDS. ? You use needles to inject street drugs. ? You live with someone who has hepatitis B. ? You have had sex with someone who has hepatitis B. ? You get hemodialysis treatment. ? You take certain medicines for conditions, including cancer, organ transplantation, and autoimmune conditions.  Hepatitis C  Blood testing is recommended for: ? Everyone born from 1945 through 1965. ? Anyone with known risk factors for hepatitis C.  Sexually transmitted infections (STIs)  You should be screened for sexually transmitted  infections (STIs) including gonorrhea and chlamydia if: ? You are sexually active and are younger than 73 years of age. ? You are older than 73 years of age and your health care provider tells you that you are at risk for this type of infection. ? Your sexual activity has changed since you were last screened and you are at an increased risk for chlamydia or gonorrhea. Ask your health care provider if you are at risk.  If you do not have HIV, but are at risk,   it may be recommended that you take a prescription medicine daily to prevent HIV infection. This is called pre-exposure prophylaxis (PrEP). You are considered at risk if: ? You are sexually active and do not regularly use condoms or know the HIV status of your partner(s). ? You take drugs by injection. ? You are sexually active with a partner who has HIV.  Talk with your health care provider about whether you are at high risk of being infected with HIV. If you choose to begin PrEP, you should first be tested for HIV. You should then be tested every 3 months for as long as you are taking PrEP. Pregnancy  If you are premenopausal and you may become pregnant, ask your health care provider about preconception counseling.  If you may become pregnant, take 400 to 800 micrograms (mcg) of folic acid every day.  If you want to prevent pregnancy, talk to your health care provider about birth control (contraception). Osteoporosis and menopause  Osteoporosis is a disease in which the bones lose minerals and strength with aging. This can result in serious bone fractures. Your risk for osteoporosis can be identified using a bone density scan.  If you are 34 years of age or older, or if you are at risk for osteoporosis and fractures, ask your health care provider if you should be screened.  Ask your health care provider whether you should take a calcium or vitamin D supplement to lower your risk for osteoporosis.  Menopause may have certain physical  symptoms and risks.  Hormone replacement therapy may reduce some of these symptoms and risks. Talk to your health care provider about whether hormone replacement therapy is right for you. Follow these instructions at home:  Schedule regular health, dental, and eye exams.  Stay current with your immunizations.  Do not use any tobacco products including cigarettes, chewing tobacco, or electronic cigarettes.  If you are pregnant, do not drink alcohol.  If you are breastfeeding, limit how much and how often you drink alcohol.  Limit alcohol intake to no more than 1 drink per day for nonpregnant women. One drink equals 12 ounces of beer, 5 ounces of wine, or 1 ounces of hard liquor.  Do not use street drugs.  Do not share needles.  Ask your health care provider for help if you need support or information about quitting drugs.  Tell your health care provider if you often feel depressed.  Tell your health care provider if you have ever been abused or do not feel safe at home. This information is not intended to replace advice given to you by your health care provider. Make sure you discuss any questions you have with your health care provider. Document Released: 07/27/2010 Document Revised: 06/19/2015 Document Reviewed: 10/15/2014 Elsevier Interactive Patient Education  [employer].

## 2017-10-27 NOTE — Progress Notes (Signed)
Subjective:    Patient ID: Kristina Ayala, female    DOB: [birth year], 72 y.o.   MRN: [id number]  HPI She is here for a physical exam.   She did gain some weight and she is not too happy about that.  She is not currently exercising.   She is tired.  She does snore.  She sleeps too much in the afternoon and then only sleeps part of the night.  She sometimes will go to sleep in the afternoon and sleep until 1 am.    Medications and allergies reviewed with patient and updated if appropriate.  Patient Active Problem List   Diagnosis Date Noted  . Thyromegaly 10/28/2017  . Pain in both lower extremities 10/12/2017  . Greater trochanteric bursitis of left hip 07/26/2017  . Hypokalemia 04/18/2017  . Degenerative arthritis of left knee 12/01/2016  . Pilonidal cyst 12/01/2016  . Anxiety 12/01/2016  . Prediabetes 09/16/2016  . Sleep difficulties 09/16/2016  . Bursitis of right shoulder 09/04/2015  . Lateral meniscus derangement 06/03/2015  . Tear of LCL (lateral collateral ligament) of knee 06/03/2015  . Lumbar radiculopathy 09/19/2014  . Low back pain 01/04/2014  . Bilateral knee pain 01/04/2014  . Diverticulosis of colon without hemorrhage 07/26/2013  . Trigger thumb of left hand 03/06/2013  . Essential hypertension, benign 02/15/2013  . Hyperlipidemia 09/28/2012  . COPD (chronic obstructive pulmonary disease) ([place]) 05/22/2012  . Frequent falls 05/22/2012  . Osteoporosis 05/22/2012  . Atypical chest pain 11/12/2011    Current Outpatient Medications on File Prior to Visit  Medication Sig Dispense Refill  . ALPRAZolam (XANAX) 0.5 MG tablet Take 1 tablet (0.5 mg total) by mouth at bedtime as needed for anxiety. 30 tablet 0  . amLODipine (NORVASC) 5 MG tablet TAKE 1 TABLET(5 MG) BY MOUTH DAILY 90 tablet 1  . aspirin EC 81 MG tablet Take 81 mg by mouth daily.    [name] atorvastatin (LIPITOR) 40 MG tablet Take 0.5 tablets (20 mg total) by mouth every morning. -- Office visit needed  for further refills 90 tablet 0  . Cholecalciferol (VITAMIN D3) 10000 units TABS Take 1 tablet by mouth.    . Cyanocobalamin (VITAMIN B-12) 2500 MCG SUBL Place 1 tablet under the tongue daily.    . ferrous sulfate 325 (65 FE) MG tablet Take 1 tablet (325 mg total) by mouth daily with breakfast. 30 tablet 3  . gabapentin (NEURONTIN) 300 MG capsule Take 1 capsule (300 mg total) by mouth at bedtime. 30 capsule 3  . hydrochlorothiazide (HYDRODIURIL) 25 MG tablet TAKE 1 TABLET(25 MG) BY MOUTH EVERY MORNING 90 tablet 3  . lisinopril (PRINIVIL,ZESTRIL) 40 MG tablet TAKE 1 TABLET(40 MG) BY MOUTH DAILY 90 tablet 0  . Magnesium 100 MG CAPS Take 100 mg by mouth daily.    . meloxicam (MOBIC) 7.5 MG tablet Take 1 tablet (7.5 mg total) by mouth daily. 30 tablet 0  . potassium chloride (KLOR-CON) 8 MEQ tablet Take 2 tablets (16 mEq total) daily by mouth. 60 tablet 3  . tiZANidine (ZANAFLEX) 4 MG tablet   2  . TURMERIC PO Take 1 tablet by mouth daily.     No current facility-administered medications on file prior to visit.     Past Medical History:  Diagnosis Date  . Arthritis   . Bursitis    left elbow  . Clostridium difficile infection   . Frequent falls   . HLD (hyperlipidemia)   . Hypertension   . Infectious colitis  Past Surgical History:  Procedure Laterality Date  . COLONOSCOPY    . ORIF PROXIMAL TIBIAL PLATEAU FRACTURE Left 02/19/2012   knee  . POLYPECTOMY    . TONSILLECTOMY      Social History   Socioeconomic History  . Marital status: Divorced    Spouse name: Not on file  . Number of children: 2  . Years of education: Not on file  . Highest education level: Not on file  Occupational History  . Occupation: RETIRED  Social Needs  . Financial resource strain: Not hard at all  . Food insecurity:    Worry: Never true    Inability: Never true  . Transportation needs:    Medical: No    Non-medical: No  Tobacco Use  . Smoking status: Former Smoker    Packs/day: 0.25     Years: 50.00    Pack years: 12.50    Types: Cigarettes    Last attempt to quit: 08/28/2014    Years since quitting: 3.1  . Smokeless tobacco: Never Used  . Tobacco comment: 1 pack every 3 days  Substance and Sexual Activity  . Alcohol use: No    Alcohol/week: 0.0 standard drinks  . Drug use: No  . Sexual activity: Not Currently  Lifestyle  . Physical activity:    Days per week: 4 days    Minutes per session: 40 min  . Stress: Not at all  Relationships  . Social connections:    Talks on phone: More than three times a week    Gets together: More than three times a week    Attends religious service: More than 4 times per year    Active member of club or organization: Yes    Attends meetings of clubs or organizations: More than 4 times per year    Relationship status: Not on file  Other Topics Concern  . Not on file  Social History Narrative   Lives alone has 2 supportive children.       Family History  Problem Relation Age of Onset  . Diverticulosis Mother   . Ovarian cancer Mother   . Pancreatic cancer Father   . Lung cancer Father   . Colon polyps Sister   . Colon cancer Neg Hx     Review of Systems  Constitutional: Positive for fatigue. Negative for chills and fever.  Eyes: Negative for visual disturbance.  Respiratory: Negative for cough, shortness of breath and wheezing.   Cardiovascular: Negative for chest pain, palpitations and leg swelling.  Gastrointestinal: Negative for abdominal pain, blood in stool, constipation, diarrhea and nausea.       No gerd  Genitourinary: Negative for dysuria and hematuria.  Musculoskeletal: Positive for arthralgias.  Skin: Negative for color change and rash.  Neurological: Negative for light-headedness and headaches.  Psychiatric/Behavioral: Negative for dysphoric mood. The patient is not nervous/anxious.        Objective:   Vitals:   10/28/17 1351  BP: 134/72  Pulse: 94  Resp: 16  Temp: 99.4 F (37.4 C)  SpO2: 97%    Filed Weights   10/28/17 1351  Weight: 169 lb 1.9 oz (76.7 kg)   Body mass index is 27.3 kg/m.  Wt Readings from Last 3 Encounters:  10/28/17 169 lb 1.9 oz (76.7 kg)  10/12/17 167 lb (75.8 kg)  08/30/17 165 lb (74.8 kg)     Physical Exam Constitutional: She appears well-developed and well-nourished. No distress.  HENT:  Head: Normocephalic and atraumatic.  Right Ear:  External ear normal. Normal ear canal and TM Left Ear: External ear normal.  Normal ear canal and TM Mouth/Throat: Oropharynx is clear and moist.  Eyes: Conjunctivae and EOM are normal.  Neck: Neck supple. No tracheal deviation present. thyromegaly present with possible nodules..  No carotid bruit  Cardiovascular: Normal rate, regular rhythm and normal heart sounds.   No murmur heard.  No edema. Pulmonary/Chest: Effort normal and breath sounds normal. No respiratory distress. She has no wheezes. She has no rales.  Breast: deferred to Gyn Abdominal: Soft. She exhibits no distension. There is no tenderness.  Lymphadenopathy: She has no cervical adenopathy.  Skin: Skin is warm and dry. She is not diaphoretic.  Psychiatric: She has a normal mood and affect. Her behavior is normal.        Assessment & Plan:   Physical exam: Screening blood work ordered Immunizations   Flu vaccine today, discussed shingrix Colonoscopy    Up to date  Mammogram   Up to date  Gyn  No longer seeing gyn Dexa   Up to date  Eye exams  Will schedule eye appt Exercise  None right now Weight   Advised weight loss   Skin   No concerns Substance abuse  none  See Problem List for Assessment and Plan of chronic medical problems.   FU in 6 months

## 2017-10-28 ENCOUNTER — Encounter: Payer: Self-pay | Admitting: Internal Medicine

## 2017-10-28 ENCOUNTER — Other Ambulatory Visit (INDEPENDENT_AMBULATORY_CARE_PROVIDER_SITE_OTHER): Payer: Medicare Other

## 2017-10-28 ENCOUNTER — Ambulatory Visit (INDEPENDENT_AMBULATORY_CARE_PROVIDER_SITE_OTHER): Payer: Medicare Other | Admitting: Internal Medicine

## 2017-10-28 VITALS — BP 134/72 | HR 94 | Temp 99.4°F | Resp 16 | Ht 66.0 in | Wt 169.1 lb

## 2017-10-28 DIAGNOSIS — I10 Essential (primary) hypertension: Secondary | ICD-10-CM

## 2017-10-28 DIAGNOSIS — E010 Iodine-deficiency related diffuse (endemic) goiter: Secondary | ICD-10-CM | POA: Insufficient documentation

## 2017-10-28 DIAGNOSIS — E876 Hypokalemia: Secondary | ICD-10-CM

## 2017-10-28 DIAGNOSIS — E042 Nontoxic multinodular goiter: Secondary | ICD-10-CM | POA: Insufficient documentation

## 2017-10-28 DIAGNOSIS — Z23 Encounter for immunization: Secondary | ICD-10-CM | POA: Diagnosis not present

## 2017-10-28 DIAGNOSIS — R7303 Prediabetes: Secondary | ICD-10-CM

## 2017-10-28 DIAGNOSIS — Z0000 Encounter for general adult medical examination without abnormal findings: Secondary | ICD-10-CM | POA: Diagnosis not present

## 2017-10-28 DIAGNOSIS — E7800 Pure hypercholesterolemia, unspecified: Secondary | ICD-10-CM

## 2017-10-28 DIAGNOSIS — F419 Anxiety disorder, unspecified: Secondary | ICD-10-CM

## 2017-10-28 LAB — HEMOGLOBIN A1C: Hgb A1c MFr Bld: 6.1 % (ref 4.6–6.5)

## 2017-10-28 LAB — LIPID PANEL
Cholesterol: 164 mg/dL (ref 0–200)
HDL: 47.3 mg/dL (ref >39.00)
LDL Cholesterol: 94 mg/dL (ref 0–99)
NONHDL: 116.23
TRIGLYCERIDES: 109 mg/dL (ref 0.0–149.0)
Total CHOL/HDL Ratio: 3
VLDL: 21.8 mg/dL (ref 0.0–40.0)

## 2017-10-28 LAB — COMPREHENSIVE METABOLIC PANEL
ALT: 24 U/L (ref 0–35)
AST: 18 U/L (ref 0–37)
Albumin: 4.4 g/dL (ref 3.5–5.2)
Alkaline Phosphatase: 43 U/L (ref 39–117)
BUN: 16 mg/dL (ref 6–23)
CO2: 31 meq/L (ref 19–32)
Calcium: 9.4 mg/dL (ref 8.4–10.5)
Chloride: 104 mEq/L (ref 96–112)
Creatinine, Ser: 0.83 mg/dL (ref 0.40–1.20)
GFR: 86.51 mL/min (ref >60.00)
GLUCOSE: 89 mg/dL (ref 70–99)
POTASSIUM: 3.3 meq/L — AB (ref 3.5–5.1)
Sodium: 141 mEq/L (ref 135–145)
Total Bilirubin: 0.4 mg/dL (ref 0.2–1.2)
Total Protein: 7.2 g/dL (ref 6.0–8.3)

## 2017-10-28 LAB — CBC WITH DIFFERENTIAL/PLATELET
BASOS PCT: 1.4 % (ref 0.0–3.0)
Basophils Absolute: 0.1 10*3/uL (ref 0.0–0.1)
EOS PCT: 1.7 % (ref 0.0–5.0)
Eosinophils Absolute: 0.1 10*3/uL (ref 0.0–0.7)
HCT: 38.8 % (ref 36.0–46.0)
Hemoglobin: 12.7 g/dL (ref 12.0–15.0)
LYMPHS ABS: 3.2 10*3/uL (ref 0.7–4.0)
Lymphocytes Relative: 40 % (ref 12.0–46.0)
MCHC: 32.7 g/dL (ref 30.0–36.0)
MCV: 82.5 fl (ref 78.0–100.0)
MONOS PCT: 8.8 % (ref 3.0–12.0)
Monocytes Absolute: 0.7 10*3/uL (ref 0.1–1.0)
NEUTROS ABS: 3.9 10*3/uL (ref 1.4–7.7)
NEUTROS PCT: 48.1 % (ref 43.0–77.0)
PLATELETS: 293 10*3/uL (ref 150.0–400.0)
RBC: 4.7 Mil/uL (ref 3.87–5.11)
RDW: 15.8 % — ABNORMAL HIGH (ref 11.5–15.5)
WBC: 8.1 10*3/uL (ref 4.0–10.5)

## 2017-10-28 LAB — TSH: TSH: 0.65 u[IU]/mL (ref 0.35–4.50)

## 2017-10-28 NOTE — Assessment & Plan Note (Signed)
BP well controlled Current regimen effective and well tolerated Continue current medications at current doses cmp  

## 2017-10-28 NOTE — Assessment & Plan Note (Signed)
Check lipid panel  Continue daily statin Regular exercise and healthy diet encouraged  

## 2017-10-28 NOTE — Assessment & Plan Note (Signed)
Check a1c Low sugar / carb diet Stressed regular exercise, weight loss  

## 2017-10-28 NOTE — Assessment & Plan Note (Signed)
Thyromegaly on exam, ? Nodules Check [place]

## 2017-10-28 NOTE — Assessment & Plan Note (Signed)
Denies anxiety Not currently taking xanax - has taken it on occasion  Advised not to take the xanax if she really does not need it

## 2017-10-28 NOTE — Addendum Note (Signed)
Addended by: Delice Bison E on: 10/28/2017 03:31 PM   Modules accepted: Orders

## 2017-10-31 ENCOUNTER — Telehealth: Payer: Self-pay | Admitting: Internal Medicine

## 2017-10-31 NOTE — Telephone Encounter (Signed)
Copied from [place] [phone]. Topic: Quick Communication - See Telephone Encounter >> Oct 31, 2017  4:24 PM Blase Mess A wrote: CRM for notification. See Telephone encounter for: 10/31/17.  Patient calling back for lab results.  Nurse Triage line was busy Please advise and call patient back. Thanks!

## 2017-10-31 NOTE — Telephone Encounter (Signed)
Results addressed in result notes

## 2017-11-01 ENCOUNTER — Other Ambulatory Visit: Payer: Self-pay

## 2017-11-01 MED ORDER — POTASSIUM CHLORIDE ER 8 MEQ PO TBCR: 16.0000 meq | EXTENDED_RELEASE_TABLET | Freq: Every day | ORAL | 3 refills | Status: DC

## 2017-11-04 NOTE — Telephone Encounter (Signed)
Pt aware of results 

## 2017-11-04 NOTE — Telephone Encounter (Signed)
Copied from [place] [phone]. Topic: General - Other >> Nov 03, 2017  4:42 PM Antonieta Iba C wrote: Reason for CRM: pt is calling in stating that she need to speak back with Lovena Le.  Please call back.   CB:[phone]

## 2017-11-04 NOTE — Telephone Encounter (Signed)
Tried calling pt. No answer. LVM for pt to call back.

## 2017-11-06 NOTE — Progress Notes (Signed)
[name] [address], [place] [address] Phone: [phone] Subjective:    I [name] am serving as a Education administrator for Dr. Hulan Saas.   CC: Left knee and hip pain  [id number]  Kristina Ayala is a 72 y.o. female coming in with complaint of left knee pain.  Left knee pain.  Patient had a history of tibial plateau fracture.  Given an injection 3 months ago.  Patient does not want injections on a regular basis though. Patient is also having mild left hip pain.  Pain over the lateral aspect of the hip.  Patient does have a negative straight leg test.      Past Medical History:  Diagnosis Date  . Arthritis   . Bursitis    left elbow  . Clostridium difficile infection   . Frequent falls   . HLD (hyperlipidemia)   . Hypertension   . Infectious colitis    Past Surgical History:  Procedure Laterality Date  . COLONOSCOPY    . ORIF PROXIMAL TIBIAL PLATEAU FRACTURE Left 02/19/2012   knee  . POLYPECTOMY    . TONSILLECTOMY     Social History   Socioeconomic History  . Marital status: Divorced    Spouse name: Not on file  . Number of children: 2  . Years of education: Not on file  . Highest education level: Not on file  Occupational History  . Occupation: RETIRED  Social Needs  . Financial resource strain: Not hard at all  . Food insecurity:    Worry: Never true    Inability: Never true  . Transportation needs:    Medical: No    Non-medical: No  Tobacco Use  . Smoking status: Former Smoker    Packs/day: 0.25    Years: 50.00    Pack years: 12.50    Types: Cigarettes    Last attempt to quit: 08/28/2014    Years since quitting: 3.1  . Smokeless tobacco: Never Used  . Tobacco comment: 1 pack every 3 days  Substance and Sexual Activity  . Alcohol use: No    Alcohol/week: 0.0 standard drinks  . Drug use: No  . Sexual activity: Not Currently  Lifestyle  . Physical activity:    Days per week: 4 days    Minutes per  session: 40 min  . Stress: Not at all  Relationships  . Social connections:    Talks on phone: More than three times a week    Gets together: More than three times a week    Attends religious service: More than 4 times per year    Active member of club or organization: Yes    Attends meetings of clubs or organizations: More than 4 times per year    Relationship status: Not on file  Other Topics Concern  . Not on file  Social History Narrative   Lives alone has 2 supportive children.      Allergies  Allergen Reactions  . Doxycycline Diarrhea  . Minocycline Diarrhea  . Wellbutrin [Bupropion] Swelling    Per pt her tongue was swollen and sore/symptoms stopped once the medication was discontinued.   [name] Penicillins Rash    Has patient had a PCN reaction causing immediate rash, facial/tongue/throat swelling, SOB or lightheadedness with hypotension: yes Has patient had a PCN reaction causing severe rash involving mucus membranes or skin necrosis: no Has patient had a PCN reaction that required hospitalization: unknown Has patient had a PCN reaction  occurring within the last 10 years: no If all of the above answers are "NO", then may proceed with Cephalosporin use.    Family History  Problem Relation Age of Onset  . Diverticulosis Mother   . Ovarian cancer Mother   . Pancreatic cancer Father   . Lung cancer Father   . Colon polyps Sister   . Colon cancer Neg Hx      Current Outpatient Medications (Cardiovascular):  .  amLODipine (NORVASC) 5 MG tablet, TAKE 1 TABLET(5 MG) BY MOUTH DAILY .  atorvastatin (LIPITOR) 40 MG tablet, Take 0.5 tablets (20 mg total) by mouth every morning. -- Office visit needed for further refills .  hydrochlorothiazide (HYDRODIURIL) 25 MG tablet, TAKE 1 TABLET(25 MG) BY MOUTH EVERY MORNING .  lisinopril (PRINIVIL,ZESTRIL) 40 MG tablet, TAKE 1 TABLET(40 MG) BY MOUTH DAILY   Current Outpatient Medications (Analgesics):  .  aspirin EC 81 MG tablet, Take  81 mg by mouth daily. .  meloxicam (MOBIC) 7.5 MG tablet, Take 1 tablet (7.5 mg total) by mouth daily.  Current Outpatient Medications (Hematological):  [name]  Cyanocobalamin (VITAMIN B-12) 2500 MCG SUBL, Place 1 tablet under the tongue daily. .  ferrous sulfate 325 (65 FE) MG tablet, Take 1 tablet (325 mg total) by mouth daily with breakfast.  Current Outpatient Medications (Other):  [name]  ALPRAZolam (XANAX) 0.5 MG tablet, Take 1 tablet (0.5 mg total) by mouth at bedtime as needed for anxiety. .  Cholecalciferol (VITAMIN D3) 10000 units TABS, Take 1 tablet by mouth. .  gabapentin (NEURONTIN) 300 MG capsule, Take 1 capsule (300 mg total) by mouth at bedtime. .  Magnesium 100 MG CAPS, Take 100 mg by mouth daily. .  potassium chloride (KLOR-CON) 8 MEQ tablet, Take 2 tablets (16 mEq total) by mouth daily. (Patient taking differently: Take 24 mEq by mouth daily. ) .  tiZANidine (ZANAFLEX) 4 MG tablet,  .  TURMERIC PO, Take 1 tablet by mouth daily.    Past medical history, social, surgical and family history all reviewed in electronic medical record.  No pertanent information unless stated regarding to the chief complaint.   Review of Systems:  No headache, visual changes, nausea, vomiting, diarrhea, constipation, dizziness, abdominal pain, skin rash, fevers, chills, night sweats, weight loss, swollen lymph nodes, body aches, joint swelling, chest pain, shortness of breath, mood changes.  Positive muscle aches  Objective  Blood pressure 102/64, pulse 97, height 5\' 6"  (1.676 m), weight 169 lb (76.7 kg), SpO2 98 %.   General: No apparent distress alert and oriented x3 mood and affect normal, dressed appropriately.  HEENT: Pupils equal, extraocular movements intact  Respiratory: Patient's speak in full sentences and does not appear short of breath  Cardiovascular: No lower extremity edema, non tender, no erythema  Skin: Warm dry intact with no signs of infection or rash on extremities or on axial  skeleton.  Abdomen: Soft nontender  Neuro: Cranial nerves II through XII are intact, neurovascularly intact in all extremities with 2+ DTRs and 2+ pulses.  Lymph: No lymphadenopathy of posterior or anterior cervical chain or axillae bilaterally.  Gait antalgic gait MSK:  Non tender with full range of motion and good stability and symmetric strength and tone of shoulders, elbows, wrist,  and ankles bilaterally. Hip: Left ROM IR: 15 Deg, ER: 25 Deg, Flexion: 120 Deg, Extension: 100 Deg, Abduction: 45 Deg, Adduction: 45 Deg Strength IR: 5/5, ER: 5/5, Flexion: 5/5, Extension: 4/5, Abduction: 4/5, symmetric adduction: 5/5 Pelvic alignment unremarkable to inspection  and palpation. Standing hip rotation and gait without trendelenburg sign / unsteadiness. Greater trochanter without tenderness to palpation. No tenderness over piriformis and greater trochanter. Positive Faber Pain over the left sacroiliac joint    Impression and Recommendations:     This case required medical decision making of moderate complexity. The above documentation has been reviewed and is accurate and complete Lyndal Pulley, DO       Note: This dictation was prepared with Dragon dictation along with smaller phrase technology. Any transcriptional errors that result from this process are unintentional.

## 2017-11-07 ENCOUNTER — Encounter: Payer: Self-pay | Admitting: Family Medicine

## 2017-11-07 ENCOUNTER — Ambulatory Visit: Payer: Medicare Other | Admitting: Family Medicine

## 2017-11-07 VITALS — BP 102/64 | HR 97 | Ht 66.0 in | Wt 169.0 lb

## 2017-11-07 DIAGNOSIS — M7062 Trochanteric bursitis, left hip: Secondary | ICD-10-CM | POA: Diagnosis not present

## 2017-11-07 DIAGNOSIS — M25562 Pain in left knee: Secondary | ICD-10-CM

## 2017-11-07 DIAGNOSIS — M25552 Pain in left hip: Secondary | ICD-10-CM | POA: Diagnosis not present

## 2017-11-07 DIAGNOSIS — M1712 Unilateral primary osteoarthritis, left knee: Secondary | ICD-10-CM | POA: Diagnosis not present

## 2017-11-07 DIAGNOSIS — G8929 Other chronic pain: Secondary | ICD-10-CM

## 2017-11-07 NOTE — Assessment & Plan Note (Signed)
Mild to moderate overall.  History of tibial plateau fracture.  Declined another injection.  Does not want surgical intervention.  Been noncompliant on the bracing.  Follow-up after patient goes to 6 weeks of physical therapy

## 2017-11-07 NOTE — Assessment & Plan Note (Signed)
Continue conservative therapy, will try physical therapy.  Discussed posture, ergonomics, weight loss.  Follow-up again in 4 to 8 weeks

## 2017-11-07 NOTE — Patient Instructions (Signed)
Good to see you  Ice is your friend PT will be a good idea. Call [phone] Keep doing everything else See me again in 6 weeks

## 2017-11-14 ENCOUNTER — Encounter: Payer: Self-pay | Admitting: Family

## 2017-11-14 ENCOUNTER — Ambulatory Visit (INDEPENDENT_AMBULATORY_CARE_PROVIDER_SITE_OTHER): Payer: Medicare Other | Admitting: Family

## 2017-11-14 ENCOUNTER — Other Ambulatory Visit (INDEPENDENT_AMBULATORY_CARE_PROVIDER_SITE_OTHER): Payer: Medicare Other

## 2017-11-14 VITALS — BP 142/86 | HR 112 | Temp 98.3°F | Ht 66.0 in | Wt 167.0 lb

## 2017-11-14 DIAGNOSIS — E876 Hypokalemia: Secondary | ICD-10-CM | POA: Diagnosis not present

## 2017-11-14 DIAGNOSIS — M79606 Pain in leg, unspecified: Secondary | ICD-10-CM | POA: Diagnosis not present

## 2017-11-14 DIAGNOSIS — R0602 Shortness of breath: Secondary | ICD-10-CM

## 2017-11-14 LAB — COMPREHENSIVE METABOLIC PANEL
ALBUMIN: 4.4 g/dL (ref 3.5–5.2)
ALK PHOS: 41 U/L (ref 39–117)
ALT: 54 U/L — ABNORMAL HIGH (ref 0–35)
AST: 26 U/L (ref 0–37)
BUN: 17 mg/dL (ref 6–23)
CALCIUM: 9.5 mg/dL (ref 8.4–10.5)
CO2: 30 mEq/L (ref 19–32)
Chloride: 107 mEq/L (ref 96–112)
Creatinine, Ser: 0.72 mg/dL (ref 0.40–1.20)
GFR: 101.92 mL/min (ref >60.00)
Glucose, Bld: 97 mg/dL (ref 70–99)
POTASSIUM: 3.5 meq/L (ref 3.5–5.1)
SODIUM: 144 meq/L (ref 135–145)
Total Bilirubin: 0.4 mg/dL (ref 0.2–1.2)
Total Protein: 7.1 g/dL (ref 6.0–8.3)

## 2017-11-14 LAB — BRAIN NATRIURETIC PEPTIDE: PRO B NATRI PEPTIDE: 13 pg/mL (ref 0.0–100.0)

## 2017-11-14 LAB — VITAMIN B12: Vitamin B-12: >1525 pg/mL — ABNORMAL HIGH (ref 211–911)

## 2017-11-14 NOTE — Progress Notes (Signed)
Kristina Ayala is a 72 y.o. female with the following history as recorded in [employer]:  Patient Active Problem List   Diagnosis Date Noted  . Thyromegaly 10/28/2017  . Pain in both lower extremities 10/12/2017  . Greater trochanteric bursitis of left hip 07/26/2017  . Hypokalemia 04/18/2017  . Degenerative arthritis of left knee 12/01/2016  . Pilonidal cyst 12/01/2016  . Anxiety 12/01/2016  . Prediabetes 09/16/2016  . Sleep difficulties 09/16/2016  . Bursitis of right shoulder 09/04/2015  . Lateral meniscus derangement 06/03/2015  . Tear of LCL (lateral collateral ligament) of knee 06/03/2015  . Lumbar radiculopathy 09/19/2014  . Low back pain 01/04/2014  . Bilateral knee pain 01/04/2014  . Diverticulosis of colon without hemorrhage 07/26/2013  . Trigger thumb of left hand 03/06/2013  . Essential hypertension, benign 02/15/2013  . Hyperlipidemia 09/28/2012  . COPD (chronic obstructive pulmonary disease) ([place]) 05/22/2012  . Frequent falls 05/22/2012  . Osteoporosis 05/22/2012  . Atypical chest pain 11/12/2011    Current Outpatient Medications  Medication Sig Dispense Refill  . ALPRAZolam (XANAX) 0.5 MG tablet Take 1 tablet (0.5 mg total) by mouth at bedtime as needed for anxiety. 30 tablet 0  . amLODipine (NORVASC) 5 MG tablet TAKE 1 TABLET(5 MG) BY MOUTH DAILY 90 tablet 1  . aspirin EC 81 MG tablet Take 81 mg by mouth daily.    [name] atorvastatin (LIPITOR) 40 MG tablet Take 0.5 tablets (20 mg total) by mouth every morning. -- Office visit needed for further refills 90 tablet 0  . Cholecalciferol (VITAMIN D3) 10000 units TABS Take 1 tablet by mouth.    . Cyanocobalamin (VITAMIN B-12) 2500 MCG SUBL Place 1 tablet under the tongue daily.    . ferrous sulfate 325 (65 FE) MG tablet Take 1 tablet (325 mg total) by mouth daily with breakfast. 30 tablet 3  . gabapentin (NEURONTIN) 300 MG capsule Take 1 capsule (300 mg total) by mouth at bedtime. 30 capsule 3  . hydrochlorothiazide  (HYDRODIURIL) 25 MG tablet TAKE 1 TABLET(25 MG) BY MOUTH EVERY MORNING 90 tablet 3  . lisinopril (PRINIVIL,ZESTRIL) 40 MG tablet TAKE 1 TABLET(40 MG) BY MOUTH DAILY 90 tablet 0  . Magnesium 100 MG CAPS Take 100 mg by mouth daily.    . meloxicam (MOBIC) 7.5 MG tablet Take 1 tablet (7.5 mg total) by mouth daily. 30 tablet 0  . potassium chloride (KLOR-CON) 8 MEQ tablet Take 2 tablets (16 mEq total) by mouth daily. (Patient taking differently: Take 24 mEq by mouth daily. ) 60 tablet 3  . tiZANidine (ZANAFLEX) 4 MG tablet   2  . TURMERIC PO Take 1 tablet by mouth daily.     No current facility-administered medications for this visit.     Allergies: Doxycycline; Minocycline; Wellbutrin [bupropion]; and Penicillins  Past Medical History:  Diagnosis Date  . Arthritis   . Bursitis    left elbow  . Clostridium difficile infection   . Frequent falls   . HLD (hyperlipidemia)   . Hypertension   . Infectious colitis     Past Surgical History:  Procedure Laterality Date  . COLONOSCOPY    . ORIF PROXIMAL TIBIAL PLATEAU FRACTURE Left 02/19/2012   knee  . POLYPECTOMY    . TONSILLECTOMY      Family History  Problem Relation Age of Onset  . Diverticulosis Mother   . Ovarian cancer Mother   . Pancreatic cancer Father   . Lung cancer Father   . Colon polyps Sister   .  Colon cancer Neg Hx     Social History   Tobacco Use  . Smoking status: Former Smoker    Packs/day: 0.25    Years: 50.00    Pack years: 12.50    Types: Cigarettes    Last attempt to quit: 08/28/2014    Years since quitting: 3.2  . Smokeless tobacco: Never Used  . Tobacco comment: 1 pack every 3 days  Substance Use Topics  . Alcohol use: No    Alcohol/week: 0.0 standard drinks    Subjective:  Patient walked into the office today without scheduled appointment; Patient recently had her dosage of potassium increased- taking 3 tablets per day; feels that since she had the dosage increased, she has been feeling more short of  breath and having increased problems with pain in her legs; she denies any chest pain; she feels that her pulse is elevated today because she is agitated and frustrated that she couldn't just go downstairs and get her labs done/ not sure why she was told she needed an OV;  Patient did see sports medicine last week and has been referred to PT about the pain in her knees/ however, she is adamant that the pain she is feeling today is due to the potassium supplement and has nothing to do with her orthopedic needs.     Objective:  Vitals:   11/14/17 1421  BP: (!) 142/86  Pulse: (!) 112  Temp: 98.3 F (36.8 C)  TempSrc: Oral  SpO2: 97%  Weight: 167 lb (75.8 kg)  Height: 5' 6" (1.676 m)    General: Well developed, well nourished, in no acute distress  Skin : Warm and dry.  Head: Normocephalic and atraumatic  Eyes: Sclera and conjunctiva clear; pupils round and reactive to light; extraocular movements intact  Ears: External normal; canals clear; tympanic membranes normal  Oropharynx: Pink, supple. No suspicious lesions  Neck: Supple without thyromegaly, adenopathy  Lungs: Respirations unlabored; clear to auscultation bilaterally without wheeze, rales, rhonchi  CVS exam: normal rate and regular rhythm.  Musculoskeletal: No deformities; no active joint inflammation  Extremities: No edema, cyanosis, clubbing  Vessels: Symmetric bilaterally  Neurologic: Alert and oriented; speech intact; face symmetrical; moves all extremities well; CNII-XII intact without focal deficit   Assessment:  1. Hypokalemia   2. Pain of lower extremity, unspecified laterality   3. SOB (shortness of breath)     Plan:  Patient's physical exam is reassuring; she exhibits no symptoms of shortness of breath in the office; will update EKG today- sinus tachycardia noted but no ischemic changes; will update labs today to re-check potassium- patient is to go back to taking 2 potassium daily; keep PT appointment due to leg  pain- low suspicion that this is related to her potassium; follow-up to be determined.   No follow-ups on file.  Orders Placed This Encounter  Procedures  . B Nat Peptide    Standing Status:   Future    Number of Occurrences:   1    Standing Expiration Date:   11/14/2018  . Comp Met (CMET)    Standing Status:   Future    Number of Occurrences:   1    Standing Expiration Date:   11/14/2018  . B12    Standing Status:   Future    Number of Occurrences:   1    Standing Expiration Date:   11/14/2018  . EKG 12-Lead    Requested Prescriptions    No prescriptions requested or ordered in  this encounter

## 2017-11-16 ENCOUNTER — Ambulatory Visit: Payer: Medicare Other | Admitting: Family Medicine

## 2017-11-16 ENCOUNTER — Ambulatory Visit: Payer: Self-pay

## 2017-11-16 ENCOUNTER — Encounter: Payer: Self-pay | Admitting: Family Medicine

## 2017-11-16 VITALS — BP 130/62 | HR 112 | Ht 66.0 in | Wt 171.0 lb

## 2017-11-16 DIAGNOSIS — M549 Dorsalgia, unspecified: Secondary | ICD-10-CM

## 2017-11-16 DIAGNOSIS — M5416 Radiculopathy, lumbar region: Secondary | ICD-10-CM

## 2017-11-16 MED ORDER — ACYCLOVIR 400 MG PO TABS: 400.0000 mg | ORAL_TABLET | Freq: Three times a day (TID) | ORAL | 0 refills | Status: DC

## 2017-11-16 NOTE — Progress Notes (Signed)
[name] [address] [place], [place] [address] Phone: [phone] Subjective:    I [name] am serving as a Education administrator for Dr. Hulan Saas.    CC: Bilateral hip pain  [id number]  Kristina Ayala is a 72 y.o. female coming in with complaint of bilateral hip and leg pain. Believes that it is shingles. Pain from the hips down. Can't sleep at night due to leg pain.  Patient does have bilateral hip pain.  Patient has had work-up including MRI of the back though that did show L4-L5 with L5 nerve root impingement.  Did respond to a nerve root injection back in January.  Patient did see me previously and was given an injection of the hip.  Patient states that she does not extremely remember this.  Feels like bendamustine not made any improvement.  Now the pain going down the legs more frequently.     Past Medical History:  Diagnosis Date  . Arthritis   . Bursitis    left elbow  . Clostridium difficile infection   . Frequent falls   . HLD (hyperlipidemia)   . Hypertension   . Infectious colitis    Past Surgical History:  Procedure Laterality Date  . COLONOSCOPY    . ORIF PROXIMAL TIBIAL PLATEAU FRACTURE Left 02/19/2012   knee  . POLYPECTOMY    . TONSILLECTOMY     Social History   Socioeconomic History  . Marital status: Divorced    Spouse name: Not on file  . Number of children: 2  . Years of education: Not on file  . Highest education level: Not on file  Occupational History  . Occupation: RETIRED  Social Needs  . Financial resource strain: Not hard at all  . Food insecurity:    Worry: Never true    Inability: Never true  . Transportation needs:    Medical: No    Non-medical: No  Tobacco Use  . Smoking status: Former Smoker    Packs/day: 0.25    Years: 50.00    Pack years: 12.50    Types: Cigarettes    Last attempt to quit: 08/28/2014    Years since quitting: 3.2  . Smokeless tobacco: Never Used  . Tobacco comment: 1  pack every 3 days  Substance and Sexual Activity  . Alcohol use: No    Alcohol/week: 0.0 standard drinks  . Drug use: No  . Sexual activity: Not Currently  Lifestyle  . Physical activity:    Days per week: 4 days    Minutes per session: 40 min  . Stress: Not at all  Relationships  . Social connections:    Talks on phone: More than three times a week    Gets together: More than three times a week    Attends religious service: More than 4 times per year    Active member of club or organization: Yes    Attends meetings of clubs or organizations: More than 4 times per year    Relationship status: Not on file  Other Topics Concern  . Not on file  Social History Narrative   Lives alone has 2 supportive children.      Allergies  Allergen Reactions  . Doxycycline Diarrhea  . Minocycline Diarrhea  . Wellbutrin [Bupropion] Swelling    Per pt her tongue was swollen and sore/symptoms stopped once the medication was discontinued.   [name] Penicillins Rash    Has patient had a PCN reaction causing immediate  rash, facial/tongue/throat swelling, SOB or lightheadedness with hypotension: yes Has patient had a PCN reaction causing severe rash involving mucus membranes or skin necrosis: no Has patient had a PCN reaction that required hospitalization: unknown Has patient had a PCN reaction occurring within the last 10 years: no If all of the above answers are "NO", then may proceed with Cephalosporin use.    Family History  Problem Relation Age of Onset  . Diverticulosis Mother   . Ovarian cancer Mother   . Pancreatic cancer Father   . Lung cancer Father   . Colon polyps Sister   . Colon cancer Neg Hx      Current Outpatient Medications (Cardiovascular):  .  amLODipine (NORVASC) 5 MG tablet, TAKE 1 TABLET(5 MG) BY MOUTH DAILY .  atorvastatin (LIPITOR) 40 MG tablet, Take 0.5 tablets (20 mg total) by mouth every morning. -- Office visit needed for further refills .  hydrochlorothiazide  (HYDRODIURIL) 25 MG tablet, TAKE 1 TABLET(25 MG) BY MOUTH EVERY MORNING .  lisinopril (PRINIVIL,ZESTRIL) 40 MG tablet, TAKE 1 TABLET(40 MG) BY MOUTH DAILY   Current Outpatient Medications (Analgesics):  .  aspirin EC 81 MG tablet, Take 81 mg by mouth daily. .  meloxicam (MOBIC) 7.5 MG tablet, Take 1 tablet (7.5 mg total) by mouth daily.  Current Outpatient Medications (Hematological):  [name]  Cyanocobalamin (VITAMIN B-12) 2500 MCG SUBL, Place 1 tablet under the tongue daily. .  ferrous sulfate 325 (65 FE) MG tablet, Take 1 tablet (325 mg total) by mouth daily with breakfast.  Current Outpatient Medications (Other):  [name]  ALPRAZolam (XANAX) 0.5 MG tablet, Take 1 tablet (0.5 mg total) by mouth at bedtime as needed for anxiety. .  Cholecalciferol (VITAMIN D3) 10000 units TABS, Take 1 tablet by mouth. .  gabapentin (NEURONTIN) 300 MG capsule, Take 1 capsule (300 mg total) by mouth at bedtime. .  Magnesium 100 MG CAPS, Take 100 mg by mouth daily. .  potassium chloride (KLOR-CON) 8 MEQ tablet, Take 2 tablets (16 mEq total) by mouth daily. (Patient taking differently: Take 24 mEq by mouth daily. ) .  tiZANidine (ZANAFLEX) 4 MG tablet,  .  TURMERIC PO, Take 1 tablet by mouth daily. [name]  acyclovir (ZOVIRAX) 400 MG tablet, Take 1 tablet (400 mg total) by mouth 3 (three) times daily.    Past medical history, social, surgical and family history all reviewed in electronic medical record.  No pertanent information unless stated regarding to the chief complaint.   Review of Systems:  No headache, visual changes, nausea, vomiting, diarrhea, constipation, dizziness, abdominal pain, skin rash, fevers, chills, night sweats, weight loss, swollen lymph nodes, body aches, joint swelling, chest pain, shortness of breath, mood changes.  Positive muscle aches  Objective  Blood pressure 130/62, pulse (!) 112, height 5\' 6"  (1.676 m), weight 171 lb (77.6 kg), SpO2 97 %.    General: No apparent distress alert and  oriented x3 mood and affect normal, dressed appropriately.  HEENT: Pupils equal, extraocular movements intact  Respiratory: Patient's speak in full sentences and does not appear short of breath  Cardiovascular: No lower extremity edema, non tender, no erythema  Skin: Warm dry intact with no signs of infection or rash on extremities or on axial skeleton.  Abdomen: Soft nontender  Neuro: Cranial nerves II through XII are intact, neurovascularly intact in all extremities with 2+ DTRs and 2+ pulses.  Lymph: No lymphadenopathy of posterior or anterior cervical chain or axillae bilaterally.  Gait antalgic MSK:  tender with  mild limited range of motion and good stability and symmetric strength and tone of shoulders, elbows, wrist, and ankles bilaterally.  Arthritic changes to multiple joints  Arthritic changes in the knees bilaterally.  Hip exams bilaterally show severe tenderness over the lateral aspect.  Patient's pain to the palpation of the lower spine is out of proportion to the amount of palpation.  Mild positive straight leg test bilaterally.  Significant tightness of the hamstrings.  Unable to do [place] secondary to pain.  Deep tendon reflexes though are intact    Impression and Recommendations:     This case required medical decision making of moderate complexity. The above documentation has been reviewed and is accurate and complete Lyndal Pulley, DO       Note: This dictation was prepared with Dragon dictation along with smaller phrase technology. Any transcriptional errors that result from this process are unintentional.

## 2017-11-16 NOTE — Patient Instructions (Addendum)
Good to see you  Ice is your  Epidural ordered and please call [phone] to schedule.  Try the acyclovir 3 times a day for 1 week and if it is the shingles it will clear it up  See me again 3 weeks after the epidural to see how you are doing.

## 2017-11-16 NOTE — Assessment & Plan Note (Signed)
Patient not responding to the greater trochanteric bursitis I do believe that the epidurals will be beneficial again.  Has been 9 months since patient has had one.  Would repeat again.  Discussed icing regimen and home exercise.  Discussed which activities to do.  Patient will continue the medications and make changes per the AVS.  Follow-up with me again in 3 weeks after injection  Spent  25 minutes with patient face-to-face and had greater than 50% of counseling including as described above in assessment and plan.

## 2017-11-16 NOTE — Telephone Encounter (Signed)
Pt calling with 3 day history of bilateral hip and legs and feet pain. Pt describes pain as sharp to her feet and aching to her hips and legs. Pt rates pain an 8 out of 10. Pt stated it hurts with walking or laying down in bed. Pt is using a cane to walk.  Pt was seen in office 11/07/17 with left knee and left hip pain.  Pt stated she was supposed to start physical therapy tomorrow but stated that she is hurting too much to go . Pt stated she was ordered to take oral steroids but did not take them. Pt stated that she is use to getting steroid shots for her pain. Pt stated she is also having shoulder and back pain and a peri rectal rash. Care advice given and appointment given for today at 2:15 with Dr Tamala Julian. Reason for Disposition . [1] SEVERE pain (e.g., excruciating, unable to do any normal activities) AND [2] not improved after 2 hours of pain medicine  Answer Assessment - Initial Assessment Questions 1. LOCATION and RADIATION: "Where is the pain located?"      From the hips down 2. QUALITY: "What does the pain feel like?"  (e.g., sharp, dull, aching, burning)     Sharp feet; hips down to legs aching 3. SEVERITY: "How bad is the pain?" "What does it keep you from doing?"   (Scale 1-10; or mild, moderate, severe)   -  MILD (1-3): doesn't interfere with normal activities    -  MODERATE (4-7): interferes with normal activities (e.g., work or school) or awakens from sleep, limping    -  SEVERE (8-10): excruciating pain, unable to do any normal activities, unable to walk     severe 4. ONSET: "When did the pain start?" "Does it come and go, or is it there all the time?"     3 days 5. WORK OR EXERCISE: "Has there been any recent work or exercise that involved this part of the body?"      no 6. CAUSE: "What do you think is causing the hip pain?"      Pt doesn't know 7. AGGRAVATING FACTORS: "What makes the hip pain worse?" (e.g., walking, climbing stairs, running)     Walking, laying down cannot  sleep at night 8. OTHER SYMPTOMS: "Do you have any other symptoms?" (e.g., back pain, pain shooting down leg,  fever, rash)     Shoulders and hip pain, pain shooting down legs,rash-peri rectal area  Protocols used: HIP PAIN-A-AH

## 2017-11-17 ENCOUNTER — Ambulatory Visit: Payer: Medicare Other | Admitting: Physical Therapy

## 2017-11-20 ENCOUNTER — Emergency Department (HOSPITAL_COMMUNITY)
Admission: EM | Admit: 2017-11-20 | Discharge: 2017-11-20 | Discharge status: Absent without leave | Payer: Medicare Other

## 2017-11-21 ENCOUNTER — Other Ambulatory Visit: Payer: Self-pay | Admitting: Internal Medicine

## 2017-11-21 NOTE — Telephone Encounter (Signed)
Copied from [place] [id number]. Topic: Quick Communication - Rx Refill/Question >> Nov 21, 2017  3:43 PM Selinda Flavin B, NT wrote: Medication: gabapentin (NEURONTIN) 300 MG capsule   Has the patient contacted their pharmacy? Yes.  To call office because there are no more refills on this. (Agent: If no, request that the patient contact the pharmacy for the refill.) (Agent: If yes, when and what did the pharmacy advise?)  Preferred Pharmacy (with phone number or street name): [facility], [address]  Agent: Please be advised that RX refills may take up to 3 business days. We ask that you follow-up with your pharmacy.

## 2017-11-22 MED ORDER — GABAPENTIN 300 MG PO CAPS: 300.0000 mg | ORAL_CAPSULE | Freq: Every day | ORAL | 3 refills | Status: DC

## 2017-11-22 NOTE — Telephone Encounter (Signed)
Kristina Ayala calling to check status. Kristina Ayala states that dr Tamala Julian is the one who told her that she should be taking medication. Please advise Cb#[phone]

## 2017-11-22 NOTE — Telephone Encounter (Signed)
Requested medication (s) are due for refill today: yes  Requested medication (s) are on the active medication list: yes  Last refill: 07/25/16#30 with 3 refills  Future visit scheduled: yes  Notes to clinic:  Last filled by ED physician; LOV on 11/16/17 with Dr. Tamala Julian    Requested Prescriptions  Pending Prescriptions Disp Refills   gabapentin (NEURONTIN) 300 MG capsule 30 capsule 3    Sig: Take 1 capsule (300 mg total) by mouth at bedtime.     Neurology: Anticonvulsants - gabapentin Passed - 11/21/2017  4:02 PM      Passed - Valid encounter within last 12 months    Recent Outpatient Visits          6 days ago Back pain, unspecified back location, unspecified back pain laterality, unspecified chronicity   [place], Olevia Bowens, DO   1 week ago Hypokalemia   [place], Marvis Repress, FNP   2 weeks ago Chronic pain of left knee   [place], Olevia Bowens, DO   3 weeks ago Preventative health care   [place], MD   1 month ago Lumbar radiculopathy   [facility] Rosemarie Ax, MD      Future Appointments            In 4 weeks Lyndal Pulley, [place], [place]   In 5 months [place], Claudina Lick, MD [place], [facility]

## 2017-11-28 ENCOUNTER — Telehealth: Payer: Self-pay

## 2017-11-28 NOTE — Telephone Encounter (Signed)
Left message for patient to call back  

## 2017-11-28 NOTE — Telephone Encounter (Signed)
Copied from [place] [id number]. Topic: General - Other >> Nov 28, 2017  8:29 AM Carolyn Stare wrote:  Pt call to say she is having a epidural on 11/29/17 and said she is having knee swelling and inflamation in the back of the knee and would like to speak with Dr Tamala Julian nurse before she have  the procedure tomorrow

## 2017-11-29 ENCOUNTER — Other Ambulatory Visit: Payer: Self-pay | Admitting: Family Medicine

## 2017-11-29 ENCOUNTER — Ambulatory Visit
Admission: RE | Admit: 2017-11-29 | Discharge: 2017-11-29 | Disposition: A | Discharge status: Home / Self Care | Payer: Medicare Other | Source: Ambulatory Visit | Attending: Family Medicine | Admitting: Family Medicine

## 2017-11-29 DIAGNOSIS — M47817 Spondylosis without myelopathy or radiculopathy, lumbosacral region: Secondary | ICD-10-CM | POA: Diagnosis not present

## 2017-11-29 DIAGNOSIS — M549 Dorsalgia, unspecified: Secondary | ICD-10-CM

## 2017-11-29 MED ORDER — METHYLPREDNISOLONE ACETATE 40 MG/ML INJ SUSP (RADIOLOG
120.0000 mg | Freq: Once | INTRAMUSCULAR | Status: AC
  Administered 2017-11-29: 120 mg via EPIDURAL

## 2017-11-29 MED ORDER — IOPAMIDOL (ISOVUE-M 200) INJECTION 41%
1.0000 mL | Freq: Once | INTRAMUSCULAR | Status: AC
  Administered 2017-11-29: 1 mL via EPIDURAL

## 2017-11-29 NOTE — Discharge Instructions (Signed)

## 2017-12-12 ENCOUNTER — Other Ambulatory Visit: Payer: Self-pay | Admitting: Internal Medicine

## 2017-12-12 ENCOUNTER — Ambulatory Visit: Payer: Medicare Other | Admitting: Physical Therapy

## 2017-12-13 ENCOUNTER — Ambulatory Visit: Payer: Medicare Other | Admitting: Family Medicine

## 2017-12-20 ENCOUNTER — Ambulatory Visit: Payer: Medicare Other | Admitting: Family Medicine

## 2018-01-01 ENCOUNTER — Other Ambulatory Visit: Payer: Self-pay | Admitting: Internal Medicine

## 2018-01-04 ENCOUNTER — Ambulatory Visit (INDEPENDENT_AMBULATORY_CARE_PROVIDER_SITE_OTHER): Payer: Medicare Other

## 2018-01-04 DIAGNOSIS — M810 Age-related osteoporosis without current pathological fracture: Secondary | ICD-10-CM | POA: Diagnosis not present

## 2018-01-04 MED ORDER — DENOSUMAB 60 MG/ML ~~LOC~~ SOSY
60.0000 mg | PREFILLED_SYRINGE | Freq: Once | SUBCUTANEOUS | Status: AC
  Administered 2018-01-04: 60 mg via SUBCUTANEOUS

## 2018-01-04 NOTE — Progress Notes (Signed)
prolia Injection given.   Kristina Hirota J [name], MD  

## 2018-01-11 DIAGNOSIS — Z124 Encounter for screening for malignant neoplasm of cervix: Secondary | ICD-10-CM | POA: Diagnosis not present

## 2018-01-12 ENCOUNTER — Other Ambulatory Visit: Payer: Self-pay | Admitting: Internal Medicine

## 2018-01-31 ENCOUNTER — Ambulatory Visit: Payer: Medicare Other | Admitting: Family Medicine

## 2018-01-31 ENCOUNTER — Ambulatory Visit: Payer: Medicare Other | Admitting: Family

## 2018-01-31 ENCOUNTER — Encounter: Payer: Self-pay | Admitting: Family Medicine

## 2018-01-31 ENCOUNTER — Ambulatory Visit (INDEPENDENT_AMBULATORY_CARE_PROVIDER_SITE_OTHER)
Admission: RE | Admit: 2018-01-31 | Discharge: 2018-01-31 | Disposition: A | Discharge status: Home / Self Care | Payer: Medicare Other | Source: Ambulatory Visit | Attending: Family Medicine | Admitting: Family Medicine

## 2018-01-31 VITALS — BP 138/84 | HR 98 | Ht 66.0 in | Wt 172.0 lb

## 2018-01-31 DIAGNOSIS — M25562 Pain in left knee: Secondary | ICD-10-CM | POA: Diagnosis not present

## 2018-01-31 DIAGNOSIS — G8929 Other chronic pain: Secondary | ICD-10-CM | POA: Diagnosis not present

## 2018-01-31 DIAGNOSIS — M1712 Unilateral primary osteoarthritis, left knee: Secondary | ICD-10-CM | POA: Diagnosis not present

## 2018-01-31 NOTE — Progress Notes (Signed)
[name] [address] [place], [place] [address] Phone: [phone] Subjective:     CC: Left knee pain  AST:[id number]  Kristina Ayala is a 73 y.o. female coming in with complaint of left knee pain. Having a hard time walking due to pain. Took gabapentin last night which helped. Was on her feet this past weekend which increased her pain. She is leaving for [place] on Friday and would like injection to help alleviate her pain.        Wt Readings from Last 3 Encounters:  01/31/18 172 lb (78 kg)  11/16/17 171 lb (77.6 kg)  11/14/17 167 lb (75.8 kg)     Past Medical History:  Diagnosis Date  . Arthritis   . Bursitis    left elbow  . Clostridium difficile infection   . Frequent falls   . HLD (hyperlipidemia)   . Hypertension   . Infectious colitis    Past Surgical History:  Procedure Laterality Date  . COLONOSCOPY    . ORIF PROXIMAL TIBIAL PLATEAU FRACTURE Left 02/19/2012   knee  . POLYPECTOMY    . TONSILLECTOMY     Social History   Socioeconomic History  . Marital status: Divorced    Spouse name: Not on file  . Number of children: 2  . Years of education: Not on file  . Highest education level: Not on file  Occupational History  . Occupation: RETIRED  Social Needs  . Financial resource strain: Not hard at all  . Food insecurity:    Worry: Never true    Inability: Never true  . Transportation needs:    Medical: No    Non-medical: No  Tobacco Use  . Smoking status: Former Smoker    Packs/day: 0.25    Years: 50.00    Pack years: 12.50    Types: Cigarettes    Last attempt to quit: 08/28/2014    Years since quitting: 3.4  . Smokeless tobacco: Never Used  . Tobacco comment: 1 pack every 3 days  Substance and Sexual Activity  . Alcohol use: No    Alcohol/week: 0.0 standard drinks  . Drug use: No  . Sexual activity: Not Currently  Lifestyle  . Physical activity:    Days per week: 4 days    Minutes per session: 40 min  .  Stress: Not at all  Relationships  . Social connections:    Talks on phone: More than three times a week    Gets together: More than three times a week    Attends religious service: More than 4 times per year    Active member of club or organization: Yes    Attends meetings of clubs or organizations: More than 4 times per year    Relationship status: Not on file  Other Topics Concern  . Not on file  Social History Narrative   Lives alone has 2 supportive children.      Allergies  Allergen Reactions  . Doxycycline Diarrhea  . Minocycline Diarrhea  . Wellbutrin [Bupropion] Swelling    Per pt her tongue was swollen and sore/symptoms stopped once the medication was discontinued.   [name] Penicillins Rash    Has patient had a PCN reaction causing immediate rash, facial/tongue/throat swelling, SOB or lightheadedness with hypotension: yes Has patient had a PCN reaction causing severe rash involving mucus membranes or skin necrosis: no Has patient had a PCN reaction that required hospitalization: unknown Has patient had a PCN reaction occurring  within the last 10 years: no If all of the above answers are "NO", then may proceed with Cephalosporin use.    Family History  Problem Relation Age of Onset  . Diverticulosis Mother   . Ovarian cancer Mother   . Pancreatic cancer Father   . Lung cancer Father   . Colon polyps Sister   . Colon cancer Neg Hx     Current Outpatient Medications (Endocrine & Metabolic):  .  PROLIA 60 MG/ML SOSY injection, INJECT 60 MG INTO THE SKIN ONCE FOR 1 DOSE  Current Outpatient Medications (Cardiovascular):  .  amLODipine (NORVASC) 5 MG tablet, TAKE 1 TABLET(5 MG) BY MOUTH DAILY .  atorvastatin (LIPITOR) 40 MG tablet, Take 0.5 tablets (20 mg total) by mouth every morning. -- Office visit needed for further refills .  hydrochlorothiazide (HYDRODIURIL) 25 MG tablet, TAKE 1 TABLET(25 MG) BY MOUTH EVERY MORNING .  lisinopril (PRINIVIL,ZESTRIL) 40 MG tablet, TAKE  1 TABLET(40 MG) BY MOUTH DAILY   Current Outpatient Medications (Analgesics):  .  aspirin EC 81 MG tablet, Take 81 mg by mouth daily. .  meloxicam (MOBIC) 7.5 MG tablet, Take 1 tablet (7.5 mg total) by mouth daily.  Current Outpatient Medications (Hematological):  [name]  Cyanocobalamin (VITAMIN B-12) 2500 MCG SUBL, Place 1 tablet under the tongue daily. .  ferrous sulfate 325 (65 FE) MG tablet, Take 1 tablet (325 mg total) by mouth daily with breakfast.  Current Outpatient Medications (Other):  .  acyclovir (ZOVIRAX) 400 MG tablet, Take 1 tablet (400 mg total) by mouth 3 (three) times daily. [name]  ALPRAZolam (XANAX) 0.5 MG tablet, Take 1 tablet (0.5 mg total) by mouth at bedtime as needed for anxiety. .  Cholecalciferol (VITAMIN D3) 10000 units TABS, Take 1 tablet by mouth. .  gabapentin (NEURONTIN) 300 MG capsule, Take 1 capsule (300 mg total) by mouth at bedtime. .  Magnesium 100 MG CAPS, Take 100 mg by mouth daily. .  potassium chloride (KLOR-CON) 8 MEQ tablet, Take 2 tablets (16 mEq total) by mouth daily. (Patient taking differently: Take 24 mEq by mouth daily. ) .  tiZANidine (ZANAFLEX) 4 MG tablet,  .  TURMERIC PO, Take 1 tablet by mouth daily.    Past medical history, social, surgical and family history all reviewed in electronic medical record.  No pertanent information unless stated regarding to the chief complaint.   Review of Systems:  No headache, visual changes, nausea, vomiting, diarrhea, constipation, dizziness, abdominal pain, skin rash, fevers, chills, night sweats, weight loss, swollen lymph nodes, body aches, joint swelling, , chest pain, shortness of breath, mood changes.  Positive muscle aches  Objective  Blood pressure 138/84, pulse 98, height 5\' 6"  (1.676 m), weight 172 lb (78 kg), SpO2 97 %.     General: No apparent distress alert and oriented x3 mood and affect normal, dressed appropriately.  HEENT: Pupils equal, extraocular movements intact  Respiratory: Patient's  speak in full sentences and does not appear short of breath  Cardiovascular: Trace lower extremity edema, non tender, no erythema  Skin: Warm dry intact with no signs of infection or rash on extremities or on axial skeleton.  Abdomen: Soft nontender  Neuro: Cranial nerves II through XII are intact, neurovascularly intact in all extremities with 2+ DTRs and 2+ pulses.  Lymph: No lymphadenopathy of posterior or anterior cervical chain or axillae bilaterally.  Gait n severely antalgic MSK:  tender with mild limited range of motion and good stability and symmetric strength and tone of shoulders,  elbows, wrist, hip, and ankles bilaterally.  Mild to moderate arthritic changes of multiple joints Knee:left  valgus deformity noted.  Abnormal thigh to calf ratio.  Tender to palpation over medial and PF joint line.  ROM full in flexion and extension and lower leg rotation. instability with valgus force.  painful patellar compression. Patellar glide with moderate crepitus. Patellar and quadriceps tendons unremarkable. Hamstring and quadriceps strength is normal. Contralateral knee shows mild OA   After informed written and verbal consent, patient was seated on exam table. Left knee was prepped with alcohol swab and utilizing anterolateral approach, patient's left knee space was injected with 4:1  marcaine 0.5%: Kenalog 40mg /dL. Patient tolerated the procedure well without immediate complications.     Impression and Recommendations:     This case required medical decision making of moderate complexity. The above documentation has been reviewed and is accurate and complete Lyndal Pulley, DO       Note: This dictation was prepared with Dragon dictation along with smaller phrase technology. Any transcriptional errors that result from this process are unintentional.

## 2018-01-31 NOTE — Assessment & Plan Note (Signed)
Patient given injection.  Tolerated the procedure well.  Discussed the bracing, home exercise, icing regimen.  Discussed topical anti-inflammatories.  Patient is to follow-up again in 4 to 8 weeks.

## 2018-01-31 NOTE — Patient Instructions (Signed)
Good to see you  Hope this calms down the knee  Ice is your friend Stay active See me again in 5-6 weeks Happy New Year!

## 2018-02-28 ENCOUNTER — Ambulatory Visit: Payer: Medicare Other | Admitting: Family Medicine

## 2018-03-18 NOTE — Progress Notes (Signed)
[name] [address] [place], [place] [address] Phone: [phone] Subjective:   [name], am serving as a scribe for Dr. Hulan Saas.   CC: Left knee pain follow-up  [id number]   01/31/2018: Patient given injection.  Tolerated the procedure well.  Discussed the bracing, home exercise, icing regimen.  Discussed topical anti-inflammatories.  Patient is to follow-up again in 4 to 8 weeks.  Update 03/20/2018: Kristina Ayala is a 73 y.o. female coming in with complaint of left knee pain. Patient states that she had relief from the injection. Would like to discuss xray from last visit.   Also complains of numbness in the right shoulder to wrist. Pain occurs at night. Pain has been occurring since last visit.       Past Medical History:  Diagnosis Date  . Arthritis   . Bursitis    left elbow  . Clostridium difficile infection   . Frequent falls   . HLD (hyperlipidemia)   . Hypertension   . Infectious colitis    Past Surgical History:  Procedure Laterality Date  . COLONOSCOPY    . ORIF PROXIMAL TIBIAL PLATEAU FRACTURE Left 02/19/2012   knee  . POLYPECTOMY    . TONSILLECTOMY     Social History   Socioeconomic History  . Marital status: Divorced    Spouse name: Not on file  . Number of children: 2  . Years of education: Not on file  . Highest education level: Not on file  Occupational History  . Occupation: RETIRED  Social Needs  . Financial resource strain: Not hard at all  . Food insecurity:    Worry: Never true    Inability: Never true  . Transportation needs:    Medical: No    Non-medical: No  Tobacco Use  . Smoking status: Former Smoker    Packs/day: 0.25    Years: 50.00    Pack years: 12.50    Types: Cigarettes    Last attempt to quit: 08/28/2014    Years since quitting: 3.5  . Smokeless tobacco: Never Used  . Tobacco comment: 1 pack every 3 days  Substance and Sexual Activity  . Alcohol use: No   Alcohol/week: 0.0 standard drinks  . Drug use: No  . Sexual activity: Not Currently  Lifestyle  . Physical activity:    Days per week: 4 days    Minutes per session: 40 min  . Stress: Not at all  Relationships  . Social connections:    Talks on phone: More than three times a week    Gets together: More than three times a week    Attends religious service: More than 4 times per year    Active member of club or organization: Yes    Attends meetings of clubs or organizations: More than 4 times per year    Relationship status: Not on file  Other Topics Concern  . Not on file  Social History Narrative   Lives alone has 2 supportive children.      Allergies  Allergen Reactions  . Doxycycline Diarrhea  . Minocycline Diarrhea  . Wellbutrin [Bupropion] Swelling    Per pt her tongue was swollen and sore/symptoms stopped once the medication was discontinued.   [name] Penicillins Rash    Has patient had a PCN reaction causing immediate rash, facial/tongue/throat swelling, SOB or lightheadedness with hypotension: yes Has patient had a PCN reaction causing severe rash involving mucus membranes or skin necrosis: no  Has patient had a PCN reaction that required hospitalization: unknown Has patient had a PCN reaction occurring within the last 10 years: no If all of the above answers are "NO", then may proceed with Cephalosporin use.    Family History  Problem Relation Age of Onset  . Diverticulosis Mother   . Ovarian cancer Mother   . Pancreatic cancer Father   . Lung cancer Father   . Colon polyps Sister   . Colon cancer Neg Hx     Current Outpatient Medications (Endocrine & Metabolic):  .  PROLIA 60 MG/ML SOSY injection, INJECT 60 MG INTO THE SKIN ONCE FOR 1 DOSE  Current Outpatient Medications (Cardiovascular):  .  amLODipine (NORVASC) 5 MG tablet, TAKE 1 TABLET(5 MG) BY MOUTH DAILY .  atorvastatin (LIPITOR) 40 MG tablet, Take 0.5 tablets (20 mg total) by mouth every morning. -- Office  visit needed for further refills .  hydrochlorothiazide (HYDRODIURIL) 25 MG tablet, TAKE 1 TABLET(25 MG) BY MOUTH EVERY MORNING .  lisinopril (PRINIVIL,ZESTRIL) 40 MG tablet, TAKE 1 TABLET(40 MG) BY MOUTH DAILY   Current Outpatient Medications (Analgesics):  .  aspirin EC 81 MG tablet, Take 81 mg by mouth daily. .  meloxicam (MOBIC) 7.5 MG tablet, Take 1 tablet (7.5 mg total) by mouth daily.  Current Outpatient Medications (Hematological):  [name]  Cyanocobalamin (VITAMIN B-12) 2500 MCG SUBL, Place 1 tablet under the tongue daily. .  ferrous sulfate 325 (65 FE) MG tablet, Take 1 tablet (325 mg total) by mouth daily with breakfast.  Current Outpatient Medications (Other):  .  acyclovir (ZOVIRAX) 400 MG tablet, Take 1 tablet (400 mg total) by mouth 3 (three) times daily. [name]  ALPRAZolam (XANAX) 0.5 MG tablet, Take 1 tablet (0.5 mg total) by mouth at bedtime as needed for anxiety. .  Cholecalciferol (VITAMIN D3) 10000 units TABS, Take 1 tablet by mouth. .  gabapentin (NEURONTIN) 300 MG capsule, Take 1 capsule (300 mg total) by mouth at bedtime. .  Magnesium 100 MG CAPS, Take 100 mg by mouth daily. .  potassium chloride (KLOR-CON) 8 MEQ tablet, Take 2 tablets (16 mEq total) by mouth daily. (Patient taking differently: Take 24 mEq by mouth daily. ) .  tiZANidine (ZANAFLEX) 4 MG tablet,  .  TURMERIC PO, Take 1 tablet by mouth daily.    Past medical history, social, surgical and family history all reviewed in electronic medical record.  No pertanent information unless stated regarding to the chief complaint.   Review of Systems:  No headache, visual changes, nausea, vomiting, diarrhea, constipation, dizziness, abdominal pain, skin rash, fevers, chills, night sweats, weight loss, swollen lymph nodes, body aches, joint swelling, muscle aches, chest pain, shortness of breath, mood changes.  Positive muscle aches  Objective  Blood pressure (!) 138/104, pulse 98, height 5\' 6"  (1.676 m), weight 168 lb  (76.2 kg), SpO2 99 %.    General: No apparent distress alert and oriented x3 mood and affect normal, dressed appropriately.  HEENT: Pupils equal, extraocular movements intact  Respiratory: Patient's speak in full sentences and does not appear short of breath  Cardiovascular: No lower extremity edema, non tender, no erythema  Skin: Warm dry intact with no signs of infection or rash on extremities or on axial skeleton.  Abdomen: Soft nontender  Neuro: Cranial nerves II through XII are intact, neurovascularly intact in all extremities with 2+ DTRs and 2+ pulses.  Lymph: No lymphadenopathy of posterior or anterior cervical chain or axillae bilaterally.  Gait normal with good balance  and coordination.  MSK:  Non tender with full range of motion and good stability and symmetric strength and tone of shoulders, elbows, wrist, hip and ankles bilaterally.  Knee: Left valgus deformity noted.  Mild abnormal thigh to calf ratio.  Tender to palpation over medial and PF joint line.  ROM full in flexion and extension and lower leg rotation. painful patellar compression. Patellar glide with moderate crepitus. Patellar and quadriceps tendons unremarkable. Hamstring and quadriceps strength is normal. Contralateral knee shows arthritic changes  Neck: Inspection mild loss of lordosis. No palpable stepoffs. Negative Spurling's maneuver. Mild limited range of motion of 5 degrees in all planes mild crepitus noted Grip strength and sensation normal in bilateral hands Strength good C4 to T1 distribution No sensory change to C4 to T1 Negative Hoffman sign bilaterally Reflexes normal Tenderness right trapezius noted.  Full range of motion of the shoulder noted  [id number]; 15 additional minutes spent for Therapeutic exercises as stated in above notes.  This included exercises focusing on stretching, strengthening, with significant focus on eccentric aspects.   Long term goals include an improvement in range of  motion, strength, endurance as well as avoiding reinjury. Patient's frequency would include in 1-2 times a day, 3-5 times a week for a duration of 6-12 weeks. Exercises that included:  Basic scapular stabilization to include adduction and depression of scapula Scaption, focusing on proper movement and good control Internal and External rotation utilizing a theraband, with elbow tucked at side entire time Rows with theraband   Proper technique shown and discussed handout in great detail with ATC.  All questions were discussed and answered.     Impression and Recommendations:     This case required medical decision making of moderate complexity. The above documentation has been reviewed and is accurate and complete Lyndal Pulley, DO       Note: This dictation was prepared with Dragon dictation along with smaller phrase technology. Any transcriptional errors that result from this process are unintentional.

## 2018-03-20 ENCOUNTER — Ambulatory Visit: Payer: Medicare Other | Admitting: Family Medicine

## 2018-03-20 ENCOUNTER — Encounter: Payer: Self-pay | Admitting: Family Medicine

## 2018-03-20 DIAGNOSIS — M7551 Bursitis of right shoulder: Secondary | ICD-10-CM | POA: Diagnosis not present

## 2018-03-20 DIAGNOSIS — M1712 Unilateral primary osteoarthritis, left knee: Secondary | ICD-10-CM | POA: Diagnosis not present

## 2018-03-20 NOTE — Assessment & Plan Note (Signed)
Stable.  Responding well to the injection.  Hold on viscosupplementation.  Follow-up again 4 to 6 weeks

## 2018-03-20 NOTE — Assessment & Plan Note (Signed)
History of bursitis.  Concern for more low cervical radiculopathy.  Encourage patient to restart the gabapentin.  Discussed icing regimen and home exercises.  Discussed which activities of doing which wants to avoid.  Patient will follow-up with me again in 4 to 6 weeks.

## 2018-03-20 NOTE — Patient Instructions (Signed)
Good to see you  Ice is your friend Try the gabapentin at night Here are some exercises for the morning Try to turn off tv and lay down before you fall asleep  See me again in 4 weeks if not perfect

## 2018-04-01 ENCOUNTER — Other Ambulatory Visit: Payer: Self-pay | Admitting: Internal Medicine

## 2018-04-03 ENCOUNTER — Ambulatory Visit: Payer: Self-pay | Admitting: *Deleted

## 2018-04-03 NOTE — Telephone Encounter (Signed)
Message from Kristina Ayala sent at 04/03/2018 4:05 PM EDT   Patient called to say that she have a sinus headache, Head congestion, and runny nose. Have been having these symptoms since 04/01/2018 asking for something sent to the pharmacy because OTC method not working. Ph# [phone]   Returned call to patient regarding above message. She has a hx of having sinusitis and thinks this is what is going on with her now. She is have facial pain and also headache. She states her teeth hurt. She has had chills but does not have a thermometer. Has a sore throat. Is on her last pill of allegra. She is also using a nasal saline spray. Home care advice given to her with verbal understanding.  Advised of using a neti pot, hot liquids, and  Humidifier. Also per protocol, appointment scheduled for tomorrow. Advised to call back for any concerns or increase in symptoms, pt voiced understanding.  Sinusitis before sore throat Teeth hurting  Reason for Disposition . [1] Sinus congestion (pressure, fullness) AND [2] present > 10 days  Answer Assessment - Initial Assessment Questions 1. LOCATION: "Where does it hurt?"      Headache and sinus pain and teeth 2. ONSET: "When did the sinus pain start?"  (e.g., hours, days)      Thursday 3. SEVERITY: "How bad is the pain?"   (Scale 1-10; mild, moderate or severe)   - MILD (1-3): doesn't interfere with normal activities    - MODERATE (4-7): interferes with normal activities (e.g., work or school) or awakens from sleep   - SEVERE (8-10): excruciating pain and patient unable to do any normal activities        Pain # 5 4. RECURRENT SYMPTOM: "Have you ever had sinus problems before?" If so, ask: "When was the last time?" and "What happened that time?"      Yes had before 5. NASAL CONGESTION: "Is the nose blocked?" If so, ask, "Can you open it or must you breathe through the mouth?"     Stuffy nose and runny nose 6. NASAL DISCHARGE: "Do you have discharge from your  nose?" If so ask, "What color?"     whitish 7. FEVER: "Do you have a fever?" If so, ask: "What is it, how was it measured, and when did it start?"     Has had chills 8. OTHER SYMPTOMS: "Do you have any other symptoms?" (e.g., sore throat, cough, earache, difficulty breathing)     Sore throat, no appetite, cough nonproductive post nasal drainage,  9. PREGNANCY: "Is there any chance you are pregnant?" "When was your last menstrual period?"     n/a  Protocols used: SINUS PAIN OR CONGESTION-A-AH

## 2018-04-03 NOTE — Progress Notes (Signed)
Subjective:    Patient ID: Kristina Ayala, female    DOB: Aug 03, 1945, 73 y.o.   MRN: [id number]  HPI She is here for an acute visit for cold symptoms.  Her symptoms started 5 days ago.  She is experiencing nasal congestion, PND, sinus pain/pressure, teeth pain, sore throat, dry cough, SOB, body aches, headaches, lightheadedness.  She denies fever, but has had chills.  She denies wheeze, chest tightness, nausea, diarrhea.  She has taken cough syrup, fluids  Medications and allergies reviewed with patient and updated if appropriate.  Patient Active Problem List   Diagnosis Date Noted  . Thyromegaly 10/28/2017  . Pain in both lower extremities 10/12/2017  . Greater trochanteric bursitis of left hip 07/26/2017  . Hypokalemia 04/18/2017  . Degenerative arthritis of left knee 12/01/2016  . Pilonidal cyst 12/01/2016  . Anxiety 12/01/2016  . Prediabetes 09/16/2016  . Sleep difficulties 09/16/2016  . Bursitis of right shoulder 09/04/2015  . Lateral meniscus derangement 06/03/2015  . Tear of LCL (lateral collateral ligament) of knee 06/03/2015  . Lumbar radiculopathy 09/19/2014  . Low back pain 01/04/2014  . Bilateral knee pain 01/04/2014  . Diverticulosis of colon without hemorrhage 07/26/2013  . Trigger thumb of left hand 03/06/2013  . Essential hypertension, benign 02/15/2013  . Hyperlipidemia 09/28/2012  . COPD (chronic obstructive pulmonary disease) ([place]) 05/22/2012  . Frequent falls 05/22/2012  . Osteoporosis 05/22/2012  . Atypical chest pain 11/12/2011    Current Outpatient Medications on File Prior to Visit  Medication Sig Dispense Refill  . acyclovir (ZOVIRAX) 400 MG tablet Take 1 tablet (400 mg total) by mouth 3 (three) times daily. 21 tablet 0  . ALPRAZolam (XANAX) 0.5 MG tablet Take 1 tablet (0.5 mg total) by mouth at bedtime as needed for anxiety. 30 tablet 0  . amLODipine (NORVASC) 5 MG tablet TAKE 1 TABLET(5 MG) BY MOUTH DAILY 90 tablet 0  . aspirin EC  81 MG tablet Take 81 mg by mouth daily.    [name] atorvastatin (LIPITOR) 40 MG tablet Take 0.5 tablets (20 mg total) by mouth every morning. -- Office visit needed for further refills 90 tablet 0  . Cholecalciferol (VITAMIN D3) 10000 units TABS Take 1 tablet by mouth.    . Cyanocobalamin (VITAMIN B-12) 2500 MCG SUBL Place 1 tablet under the tongue daily.    . ferrous sulfate 325 (65 FE) MG tablet Take 1 tablet (325 mg total) by mouth daily with breakfast. 30 tablet 3  . gabapentin (NEURONTIN) 300 MG capsule Take 1 capsule (300 mg total) by mouth at bedtime. 30 capsule 3  . hydrochlorothiazide (HYDRODIURIL) 25 MG tablet TAKE 1 TABLET(25 MG) BY MOUTH EVERY MORNING 90 tablet 3  . lisinopril (PRINIVIL,ZESTRIL) 40 MG tablet TAKE 1 TABLET(40 MG) BY MOUTH DAILY 90 tablet 1  . Magnesium 100 MG CAPS Take 100 mg by mouth daily.    . meloxicam (MOBIC) 7.5 MG tablet Take 1 tablet (7.5 mg total) by mouth daily. 30 tablet 0  . potassium chloride (KLOR-CON) 8 MEQ tablet Take 2 tablets (16 mEq total) by mouth daily. Follow-up appt due in April must see provider for refills 60 tablet 0  . PROLIA 60 MG/ML SOSY injection INJECT 60 MG INTO THE SKIN ONCE FOR 1 DOSE 1 mL 0  . tiZANidine (ZANAFLEX) 4 MG tablet   2  . TURMERIC PO Take 1 tablet by mouth daily.     No current facility-administered medications on file prior to visit.  Past Medical History:  Diagnosis Date  . Arthritis   . Bursitis    left elbow  . Clostridium difficile infection   . Frequent falls   . HLD (hyperlipidemia)   . Hypertension   . Infectious colitis     Past Surgical History:  Procedure Laterality Date  . COLONOSCOPY    . ORIF PROXIMAL TIBIAL PLATEAU FRACTURE Left 02/19/2012   knee  . POLYPECTOMY    . TONSILLECTOMY      Social History   Socioeconomic History  . Marital status: Divorced    Spouse name: Not on file  . Number of children: 2  . Years of education: Not on file  . Highest education level: Not on file    Occupational History  . Occupation: RETIRED  Social Needs  . Financial resource strain: Not hard at all  . Food insecurity:    Worry: Never true    Inability: Never true  . Transportation needs:    Medical: No    Non-medical: No  Tobacco Use  . Smoking status: Former Smoker    Packs/day: 0.25    Years: 50.00    Pack years: 12.50    Types: Cigarettes    Last attempt to quit: 08/28/2014    Years since quitting: 3.6  . Smokeless tobacco: Never Used  . Tobacco comment: 1 pack every 3 days  Substance and Sexual Activity  . Alcohol use: No    Alcohol/week: 0.0 standard drinks  . Drug use: No  . Sexual activity: Not Currently  Lifestyle  . Physical activity:    Days per week: 4 days    Minutes per session: 40 min  . Stress: Not at all  Relationships  . Social connections:    Talks on phone: More than three times a week    Gets together: More than three times a week    Attends religious service: More than 4 times per year    Active member of club or organization: Yes    Attends meetings of clubs or organizations: More than 4 times per year    Relationship status: Not on file  Other Topics Concern  . Not on file  Social History Narrative   Lives alone has 2 supportive children.       Family History  Problem Relation Age of Onset  . Diverticulosis Mother   . Ovarian cancer Mother   . Pancreatic cancer Father   . Lung cancer Father   . Colon polyps Sister   . Colon cancer Neg Hx     Review of Systems  Constitutional: Positive for chills. Negative for fever.  HENT: Positive for congestion, postnasal drip, sinus pressure, sinus pain and sore throat. Negative for ear pain.        Teeth pain  Respiratory: Positive for cough (dry) and shortness of breath. Negative for chest tightness and wheezing.   Gastrointestinal: Negative for diarrhea and nausea.  Musculoskeletal: Positive for myalgias.  Neurological: Positive for light-headedness and headaches.       Objective:    Vitals:   04/04/18 1431  BP: 130/76  Pulse: 92  Resp: 16  Temp: 99.1 F (37.3 C)  SpO2: 98%   Filed Weights   04/04/18 1431  Weight: 175 lb (79.4 kg)   Body mass index is 28.25 kg/m.  Wt Readings from Last 3 Encounters:  04/04/18 175 lb (79.4 kg)  03/20/18 168 lb (76.2 kg)  01/31/18 172 lb (78 kg)     Physical Exam GENERAL  APPEARANCE: Appears stated age, well appearing, NAD EYES: conjunctiva clear, no icterus HEENT: bilateral tympanic membranes and ear canals normal, oropharynx with mild erythema, max sinus tenderness, no thyromegaly, trachea midline, no cervical or supraclavicular lymphadenopathy LUNGS: Clear to auscultation without wheeze or crackles, unlabored breathing, good air entry bilaterally CARDIOVASCULAR: Normal S1,S2 without murmurs, no edema SKIN: warm, dry        Assessment & Plan:   See Problem List for Assessment and Plan of chronic medical problems.

## 2018-04-04 ENCOUNTER — Ambulatory Visit (INDEPENDENT_AMBULATORY_CARE_PROVIDER_SITE_OTHER): Payer: Medicare Other | Admitting: Internal Medicine

## 2018-04-04 ENCOUNTER — Encounter: Payer: Self-pay | Admitting: Internal Medicine

## 2018-04-04 VITALS — BP 130/76 | HR 92 | Temp 99.1°F | Resp 16 | Ht 66.0 in | Wt 175.0 lb

## 2018-04-04 DIAGNOSIS — R52 Pain, unspecified: Secondary | ICD-10-CM | POA: Diagnosis not present

## 2018-04-04 DIAGNOSIS — J320 Chronic maxillary sinusitis: Secondary | ICD-10-CM | POA: Diagnosis not present

## 2018-04-04 LAB — POCT INFLUENZA A/B
Influenza A, POC: NEGATIVE
Influenza B, POC: NEGATIVE

## 2018-04-04 MED ORDER — AZITHROMYCIN 250 MG PO TABS: ORAL_TABLET | ORAL | 0 refills | Status: DC

## 2018-04-04 NOTE — Assessment & Plan Note (Addendum)
Flu neg - symptoms c/w sinus infection Likely bacterial  Start zpak given PCN allergy otc cold medications Rest, fluid Call if no improvement

## 2018-04-04 NOTE — Telephone Encounter (Signed)
Seeing you at 2:30 today.

## 2018-04-04 NOTE — Patient Instructions (Signed)
Take the antibiotic as prescribed - complete the entire course.    Continue over the counter cold medication, advil and tylenol.  Increase your fluids and rest.    Call if no improvement      Sinusitis, Adult Sinusitis is inflammation of your sinuses. Sinuses are hollow spaces in the bones around your face. Your sinuses are located:  Around your eyes.  In the middle of your forehead.  Behind your nose.  In your cheekbones. Mucus normally drains out of your sinuses. When your nasal tissues become inflamed or swollen, mucus can become trapped or blocked. This allows bacteria, viruses, and fungi to grow, which leads to infection. Most infections of the sinuses are caused by a virus. Sinusitis can develop quickly. It can last for up to 4 weeks (acute) or for more than 12 weeks (chronic). Sinusitis often develops after a cold. What are the causes? This condition is caused by anything that creates swelling in the sinuses or stops mucus from draining. This includes:  Allergies.  Asthma.  Infection from bacteria or viruses.  Deformities or blockages in your nose or sinuses.  Abnormal growths in the nose (nasal polyps).  Pollutants, such as chemicals or irritants in the air.  Infection from fungi (rare). What increases the risk? You are more likely to develop this condition if you:  Have a weak body defense system (immune system).  Do a lot of swimming or diving.  Overuse nasal sprays.  Smoke. What are the signs or symptoms? The main symptoms of this condition are pain and a feeling of pressure around the affected sinuses. Other symptoms include:  Stuffy nose or congestion.  Thick drainage from your nose.  Swelling and warmth over the affected sinuses.  Headache.  Upper toothache.  A cough that may get worse at night.  Extra mucus that collects in the throat or the back of the nose (postnasal drip).  Decreased sense of smell and taste.  Fatigue.  A  fever.  Sore throat.  Bad breath. How is this diagnosed? This condition is diagnosed based on:  Your symptoms.  Your medical history.  A physical exam.  Tests to find out if your condition is acute or chronic. This may include: ? Checking your nose for nasal polyps. ? Viewing your sinuses using a device that has a light (endoscope). ? Testing for allergies or bacteria. ? Imaging tests, such as an MRI or CT scan. In rare cases, a bone biopsy may be done to rule out more serious types of fungal sinus disease. How is this treated? Treatment for sinusitis depends on the cause and whether your condition is chronic or acute.  If caused by a virus, your symptoms should go away on their own within 10 days. You may be given medicines to relieve symptoms. They include: ? Medicines that shrink swollen nasal passages (topical intranasal decongestants). ? Medicines that treat allergies (antihistamines). ? A spray that eases inflammation of the nostrils (topical intranasal corticosteroids). ? Rinses that help get rid of thick mucus in your nose (nasal saline washes).  If caused by bacteria, your health care provider may recommend waiting to see if your symptoms improve. Most bacterial infections will get better without antibiotic medicine. You may be given antibiotics if you have: ? A severe infection. ? A weak immune system.  If caused by narrow nasal passages or nasal polyps, you may need to have surgery. Follow these instructions at home: Medicines  Take, use, or apply over-the-counter and prescription medicines   only as told by your health care provider. These may include nasal sprays.  If you were prescribed an antibiotic medicine, take it as told by your health care provider. Do not stop taking the antibiotic even if you start to feel better. Hydrate and humidify   Drink enough fluid to keep your urine pale yellow. Staying hydrated will help to thin your mucus.  Use a cool mist  humidifier to keep the humidity level in your home above 50%.  Inhale steam for 10-15 minutes, 3-4 times a day, or as told by your health care provider. You can do this in the bathroom while a hot shower is running.  Limit your exposure to cool or dry air. Rest  Rest as much as possible.  Sleep with your head raised (elevated).  Make sure you get enough sleep each night. General instructions   Apply a warm, moist washcloth to your face 3-4 times a day or as told by your health care provider. This will help with discomfort.  Wash your hands often with soap and water to reduce your exposure to germs. If soap and water are not available, use hand sanitizer.  Do not smoke. Avoid being around people who are smoking (secondhand smoke).  Keep all follow-up visits as told by your health care provider. This is important. Contact a health care provider if:  You have a fever.  Your symptoms get worse.  Your symptoms do not improve within 10 days. Get help right away if:  You have a severe headache.  You have persistent vomiting.  You have severe pain or swelling around your face or eyes.  You have vision problems.  You develop confusion.  Your neck is stiff.  You have trouble breathing. Summary  Sinusitis is soreness and inflammation of your sinuses. Sinuses are hollow spaces in the bones around your face.  This condition is caused by nasal tissues that become inflamed or swollen. The swelling traps or blocks the flow of mucus. This allows bacteria, viruses, and fungi to grow, which leads to infection.  If you were prescribed an antibiotic medicine, take it as told by your health care provider. Do not stop taking the antibiotic even if you start to feel better.  Keep all follow-up visits as told by your health care provider. This is important. This information is not intended to replace advice given to you by your health care provider. Make sure you discuss any questions you  have with your health care provider. Document Released: 01/11/2005 Document Revised: 06/13/2017 Document Reviewed: 06/13/2017 Elsevier Interactive Patient Education  2019 [employer]   

## 2018-04-06 ENCOUNTER — Other Ambulatory Visit: Payer: Self-pay | Admitting: Internal Medicine

## 2018-04-17 ENCOUNTER — Other Ambulatory Visit: Payer: Self-pay | Admitting: Internal Medicine

## 2018-04-18 NOTE — Telephone Encounter (Signed)
Check [place] registry last filled 05/26/2017.Marland KitchenJohny Ayala

## 2018-04-21 ENCOUNTER — Other Ambulatory Visit: Payer: Self-pay | Admitting: Internal Medicine

## 2018-04-21 DIAGNOSIS — Z1231 Encounter for screening mammogram for malignant neoplasm of breast: Secondary | ICD-10-CM

## 2018-05-02 ENCOUNTER — Ambulatory Visit: Payer: Medicare Other | Admitting: Internal Medicine

## 2018-05-11 ENCOUNTER — Other Ambulatory Visit: Payer: Self-pay

## 2018-05-11 ENCOUNTER — Telehealth: Payer: Self-pay | Admitting: Family Medicine

## 2018-05-11 MED ORDER — GABAPENTIN 300 MG PO CAPS: 300.0000 mg | ORAL_CAPSULE | Freq: Every day | ORAL | 3 refills | Status: DC

## 2018-05-11 NOTE — Telephone Encounter (Signed)
Patient is requesting gabapentin to be sent to [facility] on [address].

## 2018-05-11 NOTE — Telephone Encounter (Signed)
Spoke with patient in regards to refill request.

## 2018-05-18 ENCOUNTER — Other Ambulatory Visit: Payer: Self-pay | Admitting: Internal Medicine

## 2018-05-22 ENCOUNTER — Other Ambulatory Visit: Payer: Self-pay

## 2018-05-22 ENCOUNTER — Encounter: Payer: Self-pay | Admitting: Family Medicine

## 2018-05-22 ENCOUNTER — Ambulatory Visit: Payer: Medicare Other | Admitting: Family Medicine

## 2018-05-22 ENCOUNTER — Ambulatory Visit: Payer: Self-pay

## 2018-05-22 VITALS — BP 112/62 | HR 97 | Ht 66.0 in | Wt 173.0 lb

## 2018-05-22 DIAGNOSIS — M7062 Trochanteric bursitis, left hip: Secondary | ICD-10-CM

## 2018-05-22 DIAGNOSIS — M25552 Pain in left hip: Secondary | ICD-10-CM | POA: Diagnosis not present

## 2018-05-22 NOTE — Assessment & Plan Note (Signed)
Injection given today.  Discussed icing regimen and home exercise.  Discussed which activities to do which wants to avoid.  Discussed with patient icing regimen and home exercise.  Patient injection worked about 6 months.  Follow-up with me again in 2 months

## 2018-05-22 NOTE — Patient Instructions (Signed)
Good to see you  Kristina Ayala is your friend Stay active and keep up with the bike  Be safe See me again in 2 months!

## 2018-05-22 NOTE — Progress Notes (Signed)
[name] [place], [place] [address] Phone: [phone] Subjective:     CC: Hip pain follow-up  BOF:BPZWCHENID  Kristina Ayala is a 73 y.o. female coming in with complaint of hip pain. Last seen on 03/20/2018 for shoulder and knee pain follow up. Has been using a stationary bike which has helped her knee and leg pain. Is having left hip pain over the greater trochanter area. States that she does not have pain with biking. Uses gabapentin intermittently. Last hip injection on 11/07/2017.  Patient since then started waking up at night.  Describes the pain as a dull, throbbing aching sensation.    Past Medical History:  Diagnosis Date  . Arthritis   . Bursitis    left elbow  . Clostridium difficile infection   . Frequent falls   . HLD (hyperlipidemia)   . Hypertension   . Infectious colitis    Past Surgical History:  Procedure Laterality Date  . COLONOSCOPY    . ORIF PROXIMAL TIBIAL PLATEAU FRACTURE Left 02/19/2012   knee  . POLYPECTOMY    . TONSILLECTOMY     Social History   Socioeconomic History  . Marital status: Divorced    Spouse name: Not on file  . Number of children: 2  . Years of education: Not on file  . Highest education level: Not on file  Occupational History  . Occupation: RETIRED  Social Needs  . Financial resource strain: Not hard at all  . Food insecurity:    Worry: Never true    Inability: Never true  . Transportation needs:    Medical: No    Non-medical: No  Tobacco Use  . Smoking status: Former Smoker    Packs/day: 0.25    Years: 50.00    Pack years: 12.50    Types: Cigarettes    Last attempt to quit: 08/28/2014    Years since quitting: 3.7  . Smokeless tobacco: Never Used  . Tobacco comment: 1 pack every 3 days  Substance and Sexual Activity  . Alcohol use: No    Alcohol/week: 0.0 standard drinks  . Drug use: No  . Sexual activity: Not Currently  Lifestyle  . Physical activity:    Days  per week: 4 days    Minutes per session: 40 min  . Stress: Not at all  Relationships  . Social connections:    Talks on phone: More than three times a week    Gets together: More than three times a week    Attends religious service: More than 4 times per year    Active member of club or organization: Yes    Attends meetings of clubs or organizations: More than 4 times per year    Relationship status: Not on file  Other Topics Concern  . Not on file  Social History Narrative   Lives alone has 2 supportive children.      Allergies  Allergen Reactions  . Doxycycline Diarrhea  . Minocycline Diarrhea  . Wellbutrin [Bupropion] Swelling    Per pt her tongue was swollen and sore/symptoms stopped once the medication was discontinued.   [name] Penicillins Rash    Has patient had a PCN reaction causing immediate rash, facial/tongue/throat swelling, SOB or lightheadedness with hypotension: yes Has patient had a PCN reaction causing severe rash involving mucus membranes or skin necrosis: no Has patient had a PCN reaction that required hospitalization: unknown Has patient had a PCN reaction occurring within the last  10 years: no If all of the above answers are "NO", then may proceed with Cephalosporin use.    Family History  Problem Relation Age of Onset  . Diverticulosis Mother   . Ovarian cancer Mother   . Pancreatic cancer Father   . Lung cancer Father   . Colon polyps Sister   . Colon cancer Neg Hx     Current Outpatient Medications (Endocrine & Metabolic):  .  PROLIA 60 MG/ML SOSY injection, INJECT 60 MG INTO THE SKIN ONCE FOR 1 DOSE  Current Outpatient Medications (Cardiovascular):  .  amLODipine (NORVASC) 5 MG tablet, TAKE 1 TABLET(5 MG) BY MOUTH DAILY .  atorvastatin (LIPITOR) 40 MG tablet, Take 0.5 tablets (20 mg total) by mouth every morning. -- Office visit needed for further refills .  hydrochlorothiazide (HYDRODIURIL) 25 MG tablet, TAKE 1 TABLET(25 MG) BY MOUTH EVERY MORNING  .  lisinopril (PRINIVIL,ZESTRIL) 40 MG tablet, TAKE 1 TABLET(40 MG) BY MOUTH DAILY   Current Outpatient Medications (Analgesics):  .  aspirin EC 81 MG tablet, Take 81 mg by mouth daily. .  meloxicam (MOBIC) 7.5 MG tablet, Take 1 tablet (7.5 mg total) by mouth daily.  Current Outpatient Medications (Hematological):  [name]  Cyanocobalamin (VITAMIN B-12) 2500 MCG SUBL, Place 1 tablet under the tongue daily. .  ferrous sulfate 325 (65 FE) MG tablet, Take 1 tablet (325 mg total) by mouth daily with breakfast.  Current Outpatient Medications (Other):  .  acyclovir (ZOVIRAX) 400 MG tablet, Take 1 tablet (400 mg total) by mouth 3 (three) times daily. [name]  ALPRAZolam (XANAX) 0.5 MG tablet, TAKE 1 TABLET(0.5 MG) BY MOUTH AT BEDTIME AS NEEDED FOR ANXIETY .  azithromycin (ZITHROMAX) 250 MG tablet, Take two tabs the first day and then one tab daily for four days .  Cholecalciferol (VITAMIN D3) 10000 units TABS, Take 1 tablet by mouth. .  gabapentin (NEURONTIN) 300 MG capsule, Take 1 capsule (300 mg total) by mouth at bedtime. .  Magnesium 100 MG CAPS, Take 100 mg by mouth daily. .  potassium chloride (KLOR-CON) 8 MEQ tablet, TAKE 2 TABLETS(16 MEQ) BY MOUTH DAILY. FOLLOW-UP APPOINTMENT DUE IN APRIL .  tiZANidine (ZANAFLEX) 4 MG tablet,  .  TURMERIC PO, Take 1 tablet by mouth daily.    Past medical history, social, surgical and family history all reviewed in electronic medical record.  No pertanent information unless stated regarding to the chief complaint.   Review of Systems:  No headache, visual changes, nausea, vomiting, diarrhea, constipation, dizziness, abdominal pain, skin rash, fevers, chills, night sweats, weight loss, swollen lymph nodes, body aches, joint swelling,  chest pain, shortness of breath, mood changes.  Positive muscle aches  Objective  Blood pressure 112/62, pulse 97, height 5\' 6"  (1.676 m), weight 173 lb (78.5 kg), SpO2 98 %.    General: No apparent distress alert and oriented x3  mood and affect normal, dressed appropriately.  HEENT: Pupils equal, extraocular movements intact  Respiratory: Patient's speak in full sentences and does not appear short of breath  Cardiovascular: No lower extremity edema, non tender, no erythema  Skin: Warm dry intact with no signs of infection or rash on extremities or on axial skeleton.  Abdomen: Soft nontender  Neuro: Cranial nerves II through XII are intact, neurovascularly intact in all extremities with 2+ DTRs and 2+ pulses.  Lymph: No lymphadenopathy of posterior or anterior cervical chain or axillae bilaterally.  Gait mild antalgic MSK:  tender with limited range of motion and good stability and  symmetric strength and tone of shoulders, elbows, wrist, , knee and ankles bilaterally.   Left hip exam shows severe tenderness to palpation over the greater trochanteric area.  Positive Faber on that side.  Negative straight leg test.   Procedure: Real-time Ultrasound Guided Injection of left  greater trochanteric bursitis secondary to patient's body habitus Device: GE Logiq Q7  Ultrasound guided injection is preferred based studies that show increased duration, increased effect, greater accuracy, decreased procedural pain, increased response rate, and decreased cost with ultrasound guided versus blind injection.  Verbal informed consent obtained.  Time-out conducted.  Noted no overlying erythema, induration, or other signs of local infection.  Skin prepped in a sterile fashion.  Local anesthesia: Topical Ethyl chloride.  With sterile technique and under real time ultrasound guidance:  Greater trochanteric area was visualized and patient's bursa was noted. A 22-gauge 3 inch needle was inserted and 4 cc of 0.5% Marcaine and 1 cc of Kenalog 40 mg/dL was injected. Pictures taken Completed without difficulty  Pain immediately resolved suggesting accurate placement of the medication.  Advised to call if fevers/chills, erythema, induration,  drainage, or persistent bleeding.  Images permanently stored and available for review in the ultrasound unit.  Impression: Technically successful ultrasound guided injection.       Impression and Recommendations:     This case required medical decision making of moderate complexity. The above documentation has been reviewed and is accurate and complete Lyndal Pulley, DO       Note: This dictation was prepared with Dragon dictation along with smaller phrase technology. Any transcriptional errors that result from this process are unintentional.

## 2018-05-26 ENCOUNTER — Ambulatory Visit: Payer: Medicare Other | Admitting: Family Medicine

## 2018-05-29 DIAGNOSIS — R234 Changes in skin texture: Secondary | ICD-10-CM | POA: Diagnosis not present

## 2018-05-29 DIAGNOSIS — D2372 Other benign neoplasm of skin of left lower limb, including hip: Secondary | ICD-10-CM | POA: Diagnosis not present

## 2018-05-29 DIAGNOSIS — R2689 Other abnormalities of gait and mobility: Secondary | ICD-10-CM | POA: Diagnosis not present

## 2018-05-29 DIAGNOSIS — M79672 Pain in left foot: Secondary | ICD-10-CM | POA: Diagnosis not present

## 2018-06-01 ENCOUNTER — Emergency Department (HOSPITAL_COMMUNITY): Payer: Medicare Other

## 2018-06-01 ENCOUNTER — Other Ambulatory Visit: Payer: Self-pay

## 2018-06-01 ENCOUNTER — Emergency Department (HOSPITAL_COMMUNITY)
Admission: EM | Admit: 2018-06-01 | Discharge: 2018-06-02 | Disposition: A | Discharge status: Home / Self Care | Payer: Medicare Other | Attending: Emergency Medicine | Admitting: Emergency Medicine

## 2018-06-01 ENCOUNTER — Encounter (HOSPITAL_COMMUNITY): Payer: Self-pay

## 2018-06-01 DIAGNOSIS — S0101XA Laceration without foreign body of scalp, initial encounter: Secondary | ICD-10-CM | POA: Diagnosis not present

## 2018-06-01 DIAGNOSIS — W0110XA Fall on same level from slipping, tripping and stumbling with subsequent striking against unspecified object, initial encounter: Secondary | ICD-10-CM | POA: Insufficient documentation

## 2018-06-01 DIAGNOSIS — Y999 Unspecified external cause status: Secondary | ICD-10-CM | POA: Insufficient documentation

## 2018-06-01 DIAGNOSIS — M542 Cervicalgia: Secondary | ICD-10-CM | POA: Diagnosis not present

## 2018-06-01 DIAGNOSIS — Z7982 Long term (current) use of aspirin: Secondary | ICD-10-CM | POA: Diagnosis not present

## 2018-06-01 DIAGNOSIS — R51 Headache: Secondary | ICD-10-CM | POA: Diagnosis not present

## 2018-06-01 DIAGNOSIS — J449 Chronic obstructive pulmonary disease, unspecified: Secondary | ICD-10-CM | POA: Diagnosis not present

## 2018-06-01 DIAGNOSIS — Y9301 Activity, walking, marching and hiking: Secondary | ICD-10-CM | POA: Insufficient documentation

## 2018-06-01 DIAGNOSIS — W19XXXA Unspecified fall, initial encounter: Secondary | ICD-10-CM

## 2018-06-01 DIAGNOSIS — I10 Essential (primary) hypertension: Secondary | ICD-10-CM | POA: Insufficient documentation

## 2018-06-01 DIAGNOSIS — Z79899 Other long term (current) drug therapy: Secondary | ICD-10-CM | POA: Insufficient documentation

## 2018-06-01 DIAGNOSIS — R911 Solitary pulmonary nodule: Secondary | ICD-10-CM

## 2018-06-01 DIAGNOSIS — Z87891 Personal history of nicotine dependence: Secondary | ICD-10-CM | POA: Diagnosis not present

## 2018-06-01 DIAGNOSIS — S0990XA Unspecified injury of head, initial encounter: Secondary | ICD-10-CM | POA: Diagnosis not present

## 2018-06-01 DIAGNOSIS — Y92009 Unspecified place in unspecified non-institutional (private) residence as the place of occurrence of the external cause: Secondary | ICD-10-CM | POA: Insufficient documentation

## 2018-06-01 DIAGNOSIS — M5416 Radiculopathy, lumbar region: Secondary | ICD-10-CM

## 2018-06-01 MED ORDER — LIDOCAINE-EPINEPHRINE (PF) 2 %-1:200000 IJ SOLN
20.0000 mL | Freq: Once | INTRAMUSCULAR | Status: AC
  Administered 2018-06-01: 20 mL
  Filled 2018-06-01: qty 20

## 2018-06-01 NOTE — ED Notes (Signed)
Bed: HO12 Expected date:  Expected time:  Means of arrival:  Comments: 73 yr old fall, head laceration

## 2018-06-01 NOTE — ED Provider Notes (Signed)
[facility] DEPT Provider Note   CSN: [id number] Arrival date & time: 06/01/18  2234    History   Chief Complaint Chief Complaint  Patient presents with   Head Laceration    HPI Kristina Ayala is a 73 y.o. female.     Patient with history of frequent falls presents the emergency department tonight with complaint of a fall which occurred approximately 9:30 PM.  Patient states that she was walking in slippers and slipped on a floor.  She does not member details about how she fell she did.  She woke up on the floor.  She sustained a laceration to her right posterior scalp.  Bleeding controlled prior to arrival.  EMS was called for transport.  No other treatments prior to arrival.  Patient denies anticoagulation.  She reports being in her usual state of health recently without any fevers, URI symptoms, chest pain, cough.  She denies shortness of breath, nausea, vomiting, diarrhea, or urinary symptoms.  She denies skin rash.  No weakness in the arms or the legs.  No confusion.  No vision loss or change.  She has vomited since the injury.  She did take 2 Tylenol for headache which is now resolved.     Past Medical History:  Diagnosis Date   Arthritis    Bursitis    left elbow   Clostridium difficile infection    Frequent falls    HLD (hyperlipidemia)    Hypertension    Infectious colitis     Patient Active Problem List   Diagnosis Date Noted   Maxillary sinusitis 04/04/2018   Thyromegaly 10/28/2017   Pain in both lower extremities 10/12/2017   Greater trochanteric bursitis of left hip 07/26/2017   Hypokalemia 04/18/2017   Degenerative arthritis of left knee 12/01/2016   Pilonidal cyst 12/01/2016   Anxiety 12/01/2016   Prediabetes 09/16/2016   Sleep difficulties 09/16/2016   Bursitis of right shoulder 09/04/2015   Lateral meniscus derangement 06/03/2015   Tear of LCL (lateral collateral ligament) of knee 06/03/2015    Lumbar radiculopathy 09/19/2014   Low back pain 01/04/2014   Bilateral knee pain 01/04/2014   Diverticulosis of colon without hemorrhage 07/26/2013   Trigger thumb of left hand 03/06/2013   Essential hypertension, benign 02/15/2013   Hyperlipidemia 09/28/2012   COPD (chronic obstructive pulmonary disease) ([place]) 05/22/2012   Frequent falls 05/22/2012   Osteoporosis 05/22/2012   Atypical chest pain 11/12/2011    Past Surgical History:  Procedure Laterality Date   COLONOSCOPY     ORIF PROXIMAL TIBIAL PLATEAU FRACTURE Left 02/19/2012   knee   POLYPECTOMY     TONSILLECTOMY       OB History   No obstetric history on file.      Home Medications    Prior to Admission medications   Medication Sig Start Date End Date Taking? Authorizing Provider  acyclovir (ZOVIRAX) 400 MG tablet Take 1 tablet (400 mg total) by mouth 3 (three) times daily. 11/16/17   Lyndal Pulley, DO  ALPRAZolam Duanne Moron) 0.5 MG tablet TAKE 1 TABLET(0.5 MG) BY MOUTH AT BEDTIME AS NEEDED FOR ANXIETY 04/18/18   [name], MD  amLODipine (NORVASC) 5 MG tablet TAKE 1 TABLET(5 MG) BY MOUTH DAILY 04/06/18   [name], MD  aspirin EC 81 MG tablet Take 81 mg by mouth daily.    [provider]  atorvastatin (LIPITOR) 40 MG tablet Take 0.5 tablets (20 mg total) by mouth every morning. -- Office  visit needed for further refills 08/29/17   [name], MD  azithromycin [facility]) 250 MG tablet Take two tabs the first day and then one tab daily for four days 04/04/18   [name], MD  Cholecalciferol (VITAMIN D3) 10000 units TABS Take 1 tablet by mouth.    [provider]  Cyanocobalamin (VITAMIN B-12) 2500 MCG SUBL Place 1 tablet under the tongue daily.    [provider]  ferrous sulfate 325 (65 FE) MG tablet Take 1 tablet (325 mg total) by mouth daily with breakfast. 03/29/14   Lyndal Pulley, DO  gabapentin (NEURONTIN) 300 MG capsule Take 1 capsule (300 mg total) by mouth  at bedtime. 05/11/18   Lyndal Pulley, DO  hydrochlorothiazide (HYDRODIURIL) 25 MG tablet TAKE 1 TABLET(25 MG) BY MOUTH EVERY MORNING 05/09/17   Burns, Claudina Lick, MD  lisinopril (PRINIVIL,ZESTRIL) 40 MG tablet TAKE 1 TABLET(40 MG) BY MOUTH DAILY 01/13/18   [name], MD  Magnesium 100 MG CAPS Take 100 mg by mouth daily.    [provider]  meloxicam (MOBIC) 7.5 MG tablet Take 1 tablet (7.5 mg total) by mouth daily. 10/13/17   Lyndal Pulley, DO  potassium chloride (KLOR-CON) 8 MEQ tablet TAKE 2 TABLETS(16 MEQ) BY MOUTH DAILY. FOLLOW-UP APPOINTMENT DUE IN APRIL 05/18/18   [name], MD  PROLIA 60 MG/ML SOSY injection INJECT 60 MG INTO THE SKIN ONCE FOR 1 DOSE 01/02/18   [name], MD  tiZANidine (ZANAFLEX) 4 MG tablet  06/07/17   [provider]  TURMERIC PO Take 1 tablet by mouth daily.    [provider]    Family History Family History  Problem Relation Age of Onset   Diverticulosis Mother    Ovarian cancer Mother    Pancreatic cancer Father    Lung cancer Father    Colon polyps Sister    Colon cancer Neg Hx     Social History Social History   Tobacco Use   Smoking status: Former Smoker    Packs/day: 0.25    Years: 50.00    Pack years: 12.50    Types: Cigarettes    Last attempt to quit: 08/28/2014    Years since quitting: 3.7   Smokeless tobacco: Never Used   Tobacco comment: 1 pack every 3 days  Substance Use Topics   Alcohol use: No    Alcohol/week: 0.0 standard drinks   Drug use: No     Allergies   Doxycycline; Minocycline; Wellbutrin [bupropion]; and Penicillins   Review of Systems Review of Systems  Constitutional: Negative for fever.  HENT: Negative for congestion, dental problem, rhinorrhea and sinus pressure.   Eyes: Negative for photophobia, discharge, redness and visual disturbance.  Respiratory: Negative for shortness of breath.   Cardiovascular: Negative for chest pain.  Gastrointestinal: Negative for  nausea and vomiting.  Musculoskeletal: Negative for gait problem, neck pain and neck stiffness.  Skin: Positive for wound. Negative for rash.  Neurological: Positive for headaches. Negative for syncope, speech difficulty, weakness, light-headedness and numbness.  Psychiatric/Behavioral: Negative for confusion.     Physical Exam Updated Vital Signs BP (!) 151/99    Pulse 96    Temp 98.1 F (36.7 C) (Oral)    Resp 18    Ht 5\' 5"  (1.651 m)    Wt 78 kg    SpO2 100%    BMI 28.62 kg/m   Physical Exam Vitals signs and nursing note reviewed.  Constitutional:  Appearance: She is well-developed.  HENT:     Head: Normocephalic. No raccoon eyes or Battle's sign.     Comments: 3 cm, minimally gaping, linear, hemostatic laceration to the right occipital scalp.  Wound base explored and is clean.    Right Ear: Tympanic membrane, ear canal and external ear normal. No hemotympanum.     Left Ear: Tympanic membrane, ear canal and external ear normal. No hemotympanum.     Nose: Nose normal.     Mouth/Throat:     Pharynx: Uvula midline.  Eyes:     General: Lids are normal.     Extraocular Movements:     Right eye: No nystagmus.     Left eye: No nystagmus.     Conjunctiva/sclera: Conjunctivae normal.     Pupils: Pupils are equal, round, and reactive to light.     Comments: No visible hyphema noted  Neck:     Musculoskeletal: Normal range of motion and neck supple.     Comments: Full range of motion of neck without pain.  No C-spine or upper thoracic spine tenderness to palpation. Cardiovascular:     Rate and Rhythm: Normal rate and regular rhythm.  Pulmonary:     Effort: Pulmonary effort is normal.     Breath sounds: Normal breath sounds.  Abdominal:     Palpations: Abdomen is soft.     Tenderness: There is no abdominal tenderness.  Musculoskeletal:        General: No tenderness.     Cervical back: She exhibits normal range of motion, no tenderness and no bony tenderness.     Thoracic  back: She exhibits no tenderness and no bony tenderness.     Lumbar back: She exhibits no tenderness and no bony tenderness.  Skin:    General: Skin is warm and dry.  Neurological:     Mental Status: She is alert and oriented to person, place, and time.     GCS: GCS eye subscore is 4. GCS verbal subscore is 5. GCS motor subscore is 6.     Cranial Nerves: No cranial nerve deficit.     Sensory: No sensory deficit.     Coordination: Coordination normal.     Deep Tendon Reflexes: Reflexes are normal and symmetric.      ED Treatments / Results  Labs (all labs ordered are listed, but only abnormal results are displayed) Labs Reviewed - No data to display  EKG None  Radiology Ct Head Wo Contrast  Result Date: 06/01/2018 CLINICAL DATA:  Fall with laceration and neck pain EXAM: CT HEAD WITHOUT CONTRAST CT CERVICAL SPINE WITHOUT CONTRAST TECHNIQUE: Multidetector CT imaging of the head and cervical spine was performed following the standard protocol without intravenous contrast. Multiplanar CT image reconstructions of the cervical spine were also generated. COMPARISON:  CT brain 10/14/2013 FINDINGS: CT HEAD FINDINGS Brain: No acute territorial infarction, hemorrhage or intracranial mass. Mild atrophy. Patchy hypodensity in the white matter consistent with small vessel ischemic change. Nonenlarged ventricles. Vascular: No hyperdense vessel.  Carotid vascular calcification Skull: Normal. Negative for fracture or focal lesion. Sinuses/Orbits: No acute finding. Other: Small right posterior parietal scalp laceration. CT CERVICAL SPINE FINDINGS Alignment: Straightening of the cervical spine. No subluxation. Facet alignment is Skull base and vertebrae: No acute fracture. No primary bone lesion or focal pathologic process. Soft tissues and spinal canal: No prevertebral fluid or swelling. No visible canal hematoma. Disc levels: Mild to moderate degenerative change at C6-C7 with mild degenerative changes at  C4-C5  and C5-C6. Bilateral foraminal narrowing at C6-C7. Upper chest: Enlarged appearing thyroid without dominant mass. 8 x 5 mm left apical lung nodule, incompletely visualized. Other: None IMPRESSION: 1. No CT evidence for acute intracranial abnormality. Atrophy and small vessel ischemic changes of the white matter 2. Straightening of the cervical spine without acute osseous abnormality 3. 8 x 5 mm partially visualized left apically lung nodule. Nonemergent chest CT recommended for more thorough evaluation 4. Enlarged appearing thyroid gland Electronically Signed   By: Donavan Foil M.D.   On: 06/01/2018 23:46   Ct Cervical Spine Wo Contrast  Result Date: 06/01/2018 CLINICAL DATA:  Fall with laceration and neck pain EXAM: CT HEAD WITHOUT CONTRAST CT CERVICAL SPINE WITHOUT CONTRAST TECHNIQUE: Multidetector CT imaging of the head and cervical spine was performed following the standard protocol without intravenous contrast. Multiplanar CT image reconstructions of the cervical spine were also generated. COMPARISON:  CT brain 10/14/2013 FINDINGS: CT HEAD FINDINGS Brain: No acute territorial infarction, hemorrhage or intracranial mass. Mild atrophy. Patchy hypodensity in the white matter consistent with small vessel ischemic change. Nonenlarged ventricles. Vascular: No hyperdense vessel.  Carotid vascular calcification Skull: Normal. Negative for fracture or focal lesion. Sinuses/Orbits: No acute finding. Other: Small right posterior parietal scalp laceration. CT CERVICAL SPINE FINDINGS Alignment: Straightening of the cervical spine. No subluxation. Facet alignment is Skull base and vertebrae: No acute fracture. No primary bone lesion or focal pathologic process. Soft tissues and spinal canal: No prevertebral fluid or swelling. No visible canal hematoma. Disc levels: Mild to moderate degenerative change at C6-C7 with mild degenerative changes at C4-C5 and C5-C6. Bilateral foraminal narrowing at C6-C7. Upper chest:  Enlarged appearing thyroid without dominant mass. 8 x 5 mm left apical lung nodule, incompletely visualized. Other: None IMPRESSION: 1. No CT evidence for acute intracranial abnormality. Atrophy and small vessel ischemic changes of the white matter 2. Straightening of the cervical spine without acute osseous abnormality 3. 8 x 5 mm partially visualized left apically lung nodule. Nonemergent chest CT recommended for more thorough evaluation 4. Enlarged appearing thyroid gland Electronically Signed   By: Donavan Foil M.D.   On: 06/01/2018 23:46    Procedures Procedures (including critical care time)  Medications Ordered in ED Medications  lidocaine-EPINEPHrine (XYLOCAINE W/EPI) 2 %-1:200000 (PF) injection 20 mL (20 mLs Infiltration Given by Other 06/01/18 2259)     Initial Impression / Assessment and Plan / ED Course  I have reviewed the triage vital signs and the nursing notes.  Pertinent labs & imaging results that were available during my care of the patient were reviewed by me and considered in my medical decision making (see chart for details).        Patient seen and examined. Work-up initiated.  Patient will need head CT and wound repair.  Nature of fall seems mechanical per patient report.  She does not appear confused.  I have low concern for a cervical spine injury at this point.  She has normal gross neurological exam.  Vital signs reviewed and are as follows: BP (!) 151/99    Pulse 96    Temp 98.1 F (36.7 C) (Oral)    Resp 18    Ht 5\' 5"  (1.651 m)    Wt 78 kg    SpO2 100%    BMI 28.62 kg/m   12:14 AM CT images personally reviewed and interpreted.    Pt updated on results. We had a discussion regarding her left upper lobe pulmonary nodule. She does not  recall ever being told she had a pulmonary nodule in the past. We discussed possible differentials. She will need to see PCP in 1 week for staple removal. I suggested she discuss further eval and monitoring of this nodule at that  time. She is in agreement.   Pt has ambulated well. Ready for d/c.  Patient counseled on wound care. Patient counseled on need to return or see PCP/urgent care for staple removal in 7 days. Patient was urged to return to the Emergency Department urgently with worsening pain, swelling, expanding erythema especially if it streaks away from the affected area, fever, or if they have any other concerns. Patient verbalized understanding.    Final Clinical Impressions(s) / ED Diagnoses   Final diagnoses:  Laceration of scalp, initial encounter  Fall, initial encounter  Pulmonary nodule   Patient with mechanical fall resulting in a occipital scalp laceration.  Imaging of the head and neck reassuring today.  Patient with incidental left upper lobe pulmonary nodule which will need further attention by her PCP.  Discussion with patient as above.  Patient is otherwise at her normal baseline status, has ambulated without any difficulty, and is comfortable discharged home at this time.  She has had no deterioration during ED stay.  Return instructions/precautions as above.  ED Discharge Orders    None       Carlisle Cater, Hershal Coria [id number]    Delora Fuel, MD [birth year]/65 [phone]

## 2018-06-01 NOTE — ED Triage Notes (Signed)
Pt BIB [facility] from home. Pt fell after tripped in her home. Pt hit her head on a sharp corner and has an laceration to the right side of her head. Pt denies LOC, blood thinners, or SHOB.

## 2018-06-01 NOTE — ED Notes (Signed)
Pt transported to CT ?

## 2018-06-02 NOTE — ED Notes (Signed)
Provider at bedside

## 2018-06-02 NOTE — ED Notes (Signed)
Pt ambulated in hallway with no assistance. RN walked beside pt for safety. Pt denied dizziness or SHOB.

## 2018-06-02 NOTE — ED Notes (Signed)
Discharge instructions and laceration care discussed with patient. Patient verbalizes understanding and has no questions at this time.

## 2018-06-02 NOTE — Discharge Instructions (Signed)
Please read and follow all provided instructions.  Your diagnoses today include:  1. Laceration of scalp, initial encounter   2. Fall, initial encounter   3. Pulmonary nodule     Tests performed today include:  CT scan of your head and neck that did not show any serious injury. However we did see a nodule in the upper part of your left lung. This will need to be checked on by your doctor if you have never had this before.   Vital signs. See below for your results today.   Medications prescribed:   None  Take any prescribed medications only as directed.  Home care instructions:  Follow any educational materials contained in this packet.  Follow-up instructions: Please follow-up with your primary care provider in the next 7 days for further evaluation of your symptoms.   Return instructions:  SEEK IMMEDIATE MEDICAL ATTENTION IF:  There is confusion or drowsiness (although children frequently become drowsy after injury).   You cannot awaken the injured person.   You have more than one episode of vomiting.   You notice dizziness or unsteadiness which is getting worse, or inability to walk.   You have convulsions or unconsciousness.   You experience severe, persistent headaches not relieved by Tylenol.  You cannot use arms or legs normally.   There are changes in pupil sizes. (This is the black center in the colored part of the eye)   There is clear or bloody discharge from the nose or ears.   You have change in speech, vision, swallowing, or understanding.   Localized weakness, numbness, tingling, or change in bowel or bladder control.  You have any other emergent concerns.  Additional Information: You have had a head injury which does not appear to require admission at this time.  Your vital signs today were: BP (!) 151/99    Pulse 96    Temp 98.1 F (36.7 C) (Oral)    Resp 18    Ht 5\' 5"  (1.651 m)    Wt 78 kg    SpO2 100%    BMI 28.62 kg/m  If your blood  pressure (BP) was elevated above 135/85 this visit, please have this repeated by your doctor within one month. --------------

## 2018-06-06 ENCOUNTER — Inpatient Hospital Stay: Payer: Medicare Other | Admitting: Internal Medicine

## 2018-06-08 DIAGNOSIS — R918 Other nonspecific abnormal finding of lung field: Secondary | ICD-10-CM | POA: Insufficient documentation

## 2018-06-08 DIAGNOSIS — S0101XA Laceration without foreign body of scalp, initial encounter: Secondary | ICD-10-CM | POA: Insufficient documentation

## 2018-06-08 NOTE — Progress Notes (Signed)
Subjective:    Patient ID: Kristina Ayala, female    DOB: 30-May-1945, 73 y.o.   MRN: [id number]  HPI The patient is here for follow up from the ED.   ED 06/01/2018 after fall and laceration to scalp.    She fell at home around 9:30 pm.  She was walking in slippers and slipped on the floor.  She woke up on the floor and does not remember how she fell.  She sustained a laceration to her right posterior scalp. EMS was called.  Bleeding was controlled prior to arrival in ED.  She was nauseous, but did not vomit since the injruy.  She took 2 tylenol which resolved the headache.  She denies changes in vision, SOB, CP, SOB and other complaints. On exam she had a 3 cm minimally gaping linear laceration to the right occipital scalp.  The exam of her neck was unremarkable.  Staples placed.    Imaging of head and neck revealed no acute injuries.      She has a dull headache that is intermittent since the fall.  It is mostly on the right side of her head.  She has not taken anything for it.  She thinks it has gotten better.  She has not had any changes in vision, nausea, vomiting, lightheadedness or dizziness since going to the emergency room.  She denies any anxiety out of the ordinary, feeling foggy or memory issues since the fall.  Lung nodule: She did smoke approximately a quarter of a pack daily for about 50 years.  She denies any fevers, chills, cough, wheeze or shortness of breath.  She does not have any chest pain.   Medications and allergies reviewed with patient and updated if appropriate.  Patient Active Problem List   Diagnosis Date Noted  . Scalp laceration 06/08/2018  . Abnormal findings on diagnostic imaging of lung 06/08/2018  . Thyromegaly 10/28/2017  . Pain in both lower extremities 10/12/2017  . Greater trochanteric bursitis of left hip 07/26/2017  . Hypokalemia 04/18/2017  . Degenerative arthritis of left knee 12/01/2016  . Pilonidal cyst 12/01/2016  . Anxiety 12/01/2016   . Prediabetes 09/16/2016  . Sleep difficulties 09/16/2016  . Bursitis of right shoulder 09/04/2015  . Lateral meniscus derangement 06/03/2015  . Tear of LCL (lateral collateral ligament) of knee 06/03/2015  . Lumbar radiculopathy 09/19/2014  . Low back pain 01/04/2014  . Bilateral knee pain 01/04/2014  . Diverticulosis of colon without hemorrhage 07/26/2013  . Trigger thumb of left hand 03/06/2013  . Essential hypertension, benign 02/15/2013  . Hyperlipidemia 09/28/2012  . COPD (chronic obstructive pulmonary disease) ([place]) 05/22/2012  . Frequent falls 05/22/2012  . Osteoporosis 05/22/2012  . Atypical chest pain 11/12/2011    Current Outpatient Medications on File Prior to Visit  Medication Sig Dispense Refill  . acyclovir (ZOVIRAX) 400 MG tablet Take 1 tablet (400 mg total) by mouth 3 (three) times daily. 21 tablet 0  . ALPRAZolam (XANAX) 0.5 MG tablet TAKE 1 TABLET(0.5 MG) BY MOUTH AT BEDTIME AS NEEDED FOR ANXIETY 30 tablet 0  . amLODipine (NORVASC) 5 MG tablet TAKE 1 TABLET(5 MG) BY MOUTH DAILY 90 tablet 1  . aspirin EC 81 MG tablet Take 81 mg by mouth daily.    [name] atorvastatin (LIPITOR) 40 MG tablet Take 0.5 tablets (20 mg total) by mouth every morning. -- Office visit needed for further refills 90 tablet 0  . azithromycin (ZITHROMAX) 250 MG tablet Take two tabs the first  day and then one tab daily for four days 6 tablet 0  . Cholecalciferol (VITAMIN D3) 10000 units TABS Take 1 tablet by mouth.    . Cyanocobalamin (VITAMIN B-12) 2500 MCG SUBL Place 1 tablet under the tongue daily.    . ferrous sulfate 325 (65 FE) MG tablet Take 1 tablet (325 mg total) by mouth daily with breakfast. 30 tablet 3  . gabapentin (NEURONTIN) 300 MG capsule Take 1 capsule (300 mg total) by mouth at bedtime. 30 capsule 3  . hydrochlorothiazide (HYDRODIURIL) 25 MG tablet TAKE 1 TABLET(25 MG) BY MOUTH EVERY MORNING 90 tablet 3  . lisinopril (PRINIVIL,ZESTRIL) 40 MG tablet TAKE 1 TABLET(40 MG) BY MOUTH DAILY  90 tablet 1  . Magnesium 100 MG CAPS Take 100 mg by mouth daily.    . meloxicam (MOBIC) 7.5 MG tablet Take 1 tablet (7.5 mg total) by mouth daily. 30 tablet 0  . potassium chloride (KLOR-CON) 8 MEQ tablet TAKE 2 TABLETS(16 MEQ) BY MOUTH DAILY. FOLLOW-UP APPOINTMENT DUE IN APRIL 60 tablet 0  . PROLIA 60 MG/ML SOSY injection INJECT 60 MG INTO THE SKIN ONCE FOR 1 DOSE 1 mL 0  . tiZANidine (ZANAFLEX) 4 MG tablet   2  . TURMERIC PO Take 1 tablet by mouth daily.     No current facility-administered medications on file prior to visit.     Past Medical History:  Diagnosis Date  . Arthritis   . Bursitis    left elbow  . Clostridium difficile infection   . Frequent falls   . HLD (hyperlipidemia)   . Hypertension   . Infectious colitis     Past Surgical History:  Procedure Laterality Date  . COLONOSCOPY    . ORIF PROXIMAL TIBIAL PLATEAU FRACTURE Left 02/19/2012   knee  . POLYPECTOMY    . TONSILLECTOMY      Social History   Socioeconomic History  . Marital status: Divorced    Spouse name: Not on file  . Number of children: 2  . Years of education: Not on file  . Highest education level: Not on file  Occupational History  . Occupation: RETIRED  Social Needs  . Financial resource strain: Not hard at all  . Food insecurity:    Worry: Never true    Inability: Never true  . Transportation needs:    Medical: No    Non-medical: No  Tobacco Use  . Smoking status: Former Smoker    Packs/day: 0.25    Years: 50.00    Pack years: 12.50    Types: Cigarettes    Last attempt to quit: 08/28/2014    Years since quitting: 3.7  . Smokeless tobacco: Never Used  . Tobacco comment: 1 pack every 3 days  Substance and Sexual Activity  . Alcohol use: No    Alcohol/week: 0.0 standard drinks  . Drug use: No  . Sexual activity: Not Currently  Lifestyle  . Physical activity:    Days per week: 4 days    Minutes per session: 40 min  . Stress: Not at all  Relationships  . Social connections:     Talks on phone: More than three times a week    Gets together: More than three times a week    Attends religious service: More than 4 times per year    Active member of club or organization: Yes    Attends meetings of clubs or organizations: More than 4 times per year    Relationship status: Not on file  Other Topics Concern  . Not on file  Social History Narrative   Lives alone has 2 supportive children.       Family History  Problem Relation Age of Onset  . Diverticulosis Mother   . Ovarian cancer Mother   . Pancreatic cancer Father   . Lung cancer Father   . Colon polyps Sister   . Colon cancer Neg Hx     Review of Systems  Constitutional: Negative for chills and fever.  Eyes: Negative for visual disturbance.  Respiratory: Negative for cough, shortness of breath and wheezing.   Cardiovascular: Negative for chest pain.  Gastrointestinal: Negative for nausea and vomiting.  Neurological: Positive for headaches (dull). Negative for dizziness, weakness, light-headedness and numbness.  Psychiatric/Behavioral:       No memory change, foggy feeling or unusual anxiety       Objective:   Vitals:   06/09/18 0943  BP: 138/78  Pulse: 98  Temp: 98.7 F (37.1 C)  SpO2: 98%   BP Readings from Last 3 Encounters:  06/09/18 138/78  06/02/18 (!) 145/88  05/22/18 112/62   Wt Readings from Last 3 Encounters:  06/09/18 173 lb (78.5 kg)  06/01/18 171 lb 15.3 oz (78 kg)  05/22/18 173 lb (78.5 kg)   Body mass index is 28.79 kg/m.    CT Cervical Spine Wo Contrast CLINICAL DATA:  Fall with laceration and neck pain  EXAM: CT HEAD WITHOUT CONTRAST  CT CERVICAL SPINE WITHOUT CONTRAST  TECHNIQUE: Multidetector CT imaging of the head and cervical spine was performed following the standard protocol without intravenous contrast. Multiplanar CT image reconstructions of the cervical spine were also generated.  COMPARISON:  CT brain 10/14/2013  FINDINGS: CT HEAD FINDINGS   Brain: No acute territorial infarction, hemorrhage or intracranial mass. Mild atrophy. Patchy hypodensity in the white matter consistent with small vessel ischemic change. Nonenlarged ventricles.  Vascular: No hyperdense vessel.  Carotid vascular calcification  Skull: Normal. Negative for fracture or focal lesion.  Sinuses/Orbits: No acute finding.  Other: Small right posterior parietal scalp laceration.  CT CERVICAL SPINE FINDINGS  Alignment: Straightening of the cervical spine. No subluxation. Facet alignment is  Skull base and vertebrae: No acute fracture. No primary bone lesion or focal pathologic process.  Soft tissues and spinal canal: No prevertebral fluid or swelling. No visible canal hematoma.  Disc levels: Mild to moderate degenerative change at C6-C7 with mild degenerative changes at C4-C5 and C5-C6. Bilateral foraminal narrowing at C6-C7.  Upper chest: Enlarged appearing thyroid without dominant mass. 8 x 5 mm left apical lung nodule, incompletely visualized.  Other: None  IMPRESSION: 1. No CT evidence for acute intracranial abnormality. Atrophy and small vessel ischemic changes of the white matter 2. Straightening of the cervical spine without acute osseous abnormality 3. 8 x 5 mm partially visualized left apically lung nodule. Nonemergent chest CT recommended for more thorough evaluation 4. Enlarged appearing thyroid gland  Electronically Signed   By: Donavan Foil M.D.   On: 06/01/2018 23:46 CT Head Wo Contrast CLINICAL DATA:  Fall with laceration and neck pain  EXAM: CT HEAD WITHOUT CONTRAST  CT CERVICAL SPINE WITHOUT CONTRAST  TECHNIQUE: Multidetector CT imaging of the head and cervical spine was performed following the standard protocol without intravenous contrast. Multiplanar CT image reconstructions of the cervical spine were also generated.  COMPARISON:  CT brain 10/14/2013  FINDINGS: CT HEAD FINDINGS  Brain: No acute  territorial infarction, hemorrhage or intracranial mass. Mild atrophy. Patchy hypodensity in the  white matter consistent with small vessel ischemic change. Nonenlarged ventricles.  Vascular: No hyperdense vessel.  Carotid vascular calcification  Skull: Normal. Negative for fracture or focal lesion.  Sinuses/Orbits: No acute finding.  Other: Small right posterior parietal scalp laceration.  CT CERVICAL SPINE FINDINGS  Alignment: Straightening of the cervical spine. No subluxation. Facet alignment is  Skull base and vertebrae: No acute fracture. No primary bone lesion or focal pathologic process.  Soft tissues and spinal canal: No prevertebral fluid or swelling. No visible canal hematoma.  Disc levels: Mild to moderate degenerative change at C6-C7 with mild degenerative changes at C4-C5 and C5-C6. Bilateral foraminal narrowing at C6-C7.  Upper chest: Enlarged appearing thyroid without dominant mass. 8 x 5 mm left apical lung nodule, incompletely visualized.  Other: None  IMPRESSION: 1. No CT evidence for acute intracranial abnormality. Atrophy and small vessel ischemic changes of the white matter 2. Straightening of the cervical spine without acute osseous abnormality 3. 8 x 5 mm partially visualized left apically lung nodule. Nonemergent chest CT recommended for more thorough evaluation 4. Enlarged appearing thyroid gland  Electronically Signed   By: Donavan Foil M.D.   On: 06/01/2018 23:46    Physical Exam    Constitutional: Appears well-developed and well-nourished. No distress.  HENT:  Head: Normocephalic.  Right posterior occiput with vertical laceration that has healed-covered with dried blood.  4 staples in place.  No open wound.  No active bleeding or discharge.  Area mildly tender to palpation.  No swelling. Neck: Neck supple. No tracheal deviation present. No thyromegaly present.  No cervical lymphadenopathy Cardiovascular: Normal rate, regular rhythm and  normal heart sounds.  No murmur heard. No carotid bruit .  No edema Pulmonary/Chest: Effort normal and breath sounds normal. No respiratory distress. No has no wheezes. No rales.  Skin: Skin is warm and dry. Not diaphoretic.  Psychiatric: Normal mood and affect. Behavior is normal.      Assessment & Plan:    See Problem List for Assessment and Plan of chronic medical problems.

## 2018-06-09 ENCOUNTER — Ambulatory Visit (INDEPENDENT_AMBULATORY_CARE_PROVIDER_SITE_OTHER): Payer: Medicare Other | Admitting: Internal Medicine

## 2018-06-09 ENCOUNTER — Encounter: Payer: Self-pay | Admitting: Internal Medicine

## 2018-06-09 ENCOUNTER — Other Ambulatory Visit: Payer: Self-pay

## 2018-06-09 VITALS — BP 138/78 | HR 98 | Temp 98.7°F | Ht 65.0 in | Wt 173.0 lb

## 2018-06-09 DIAGNOSIS — Z4802 Encounter for removal of sutures: Secondary | ICD-10-CM

## 2018-06-09 DIAGNOSIS — S0101XA Laceration without foreign body of scalp, initial encounter: Secondary | ICD-10-CM | POA: Diagnosis not present

## 2018-06-09 DIAGNOSIS — R918 Other nonspecific abnormal finding of lung field: Secondary | ICD-10-CM

## 2018-06-09 NOTE — Assessment & Plan Note (Addendum)
After fall 5/7 at home-went to the emergency room and head CT showed no internal bleeding 4 staples placed Wound is healing well-no active bleeding or discharge.  No swelling.  Dried blood overlying laceration 4 staples removed Discussed no local wound care as necessary-okay to wash her hair  Has not needed to take anything for headaches-can take Tylenol if needed.  No other concerning symptoms.

## 2018-06-09 NOTE — Assessment & Plan Note (Signed)
LUL nodule seen on CT of cervical spine in ED - 8 x 5 mm  H/o of smoking  .25 packs per day x 50 years, no longer smoking Ct of chest w/o contrast ordered

## 2018-06-09 NOTE — Patient Instructions (Addendum)
A CT scan was ordered to evaluate the lung nodule.  Someone will call you to schedule this.     Your staples were removed from your head.  Your wound looks good and is healing well.  You can wash your hair.

## 2018-06-09 NOTE — Assessment & Plan Note (Signed)
4 staples easily removed from right occiput with staple remover She tolerated the procedure well without difficulty No active bleeding, discharge Wound is healing well no local wound care necessary-okay to wash her hair

## 2018-06-12 ENCOUNTER — Ambulatory Visit: Payer: Medicare Other

## 2018-06-15 ENCOUNTER — Telehealth: Payer: Self-pay | Admitting: *Deleted

## 2018-06-15 ENCOUNTER — Other Ambulatory Visit: Payer: Self-pay | Admitting: Internal Medicine

## 2018-06-15 NOTE — Telephone Encounter (Signed)

## 2018-06-16 ENCOUNTER — Ambulatory Visit (INDEPENDENT_AMBULATORY_CARE_PROVIDER_SITE_OTHER)
Admission: RE | Admit: 2018-06-16 | Discharge: 2018-06-16 | Disposition: A | Discharge status: Home / Self Care | Payer: Medicare Other | Source: Ambulatory Visit | Attending: Internal Medicine | Admitting: Internal Medicine

## 2018-06-16 ENCOUNTER — Other Ambulatory Visit: Payer: Self-pay

## 2018-06-16 DIAGNOSIS — R918 Other nonspecific abnormal finding of lung field: Secondary | ICD-10-CM

## 2018-06-16 NOTE — Telephone Encounter (Signed)
Please advise on prolia injection refill.

## 2018-06-19 ENCOUNTER — Other Ambulatory Visit: Payer: Self-pay | Admitting: Internal Medicine

## 2018-06-19 ENCOUNTER — Encounter: Payer: Self-pay | Admitting: Internal Medicine

## 2018-06-19 DIAGNOSIS — R918 Other nonspecific abnormal finding of lung field: Secondary | ICD-10-CM | POA: Insufficient documentation

## 2018-06-26 NOTE — Progress Notes (Deleted)
Subjective:    Patient ID: Kristina Ayala, female    DOB: 1945/05/23, 73 y.o.   MRN: [id number]  HPI The patient is here for follow up.  Hypertension: She is taking her medication daily. She is compliant with a low sodium diet.  She denies chest pain, palpitations, edema, shortness of breath and regular headaches. She does not monitor her blood pressure at home.    Osteoporosis:  She is getting prolia injections and her next dose is due later this month.  She is taking vitamin d daily.  She is exercising.    Prediabetes:  She is compliant with a low sugar/carbohydrate diet.  She is exercising regularly.  Hyperlipidemia: She is taking her medication daily. She is compliant with a low fat/cholesterol diet. She denies myalgias.   Sleep difficulties:  She has xanax that she can take if needed.      Medications and allergies reviewed with patient and updated if appropriate.  Patient Active Problem List   Diagnosis Date Noted  . Lung nodules 06/19/2018  . Removal of staples 06/09/2018  . Scalp laceration 06/08/2018  . Abnormal findings on diagnostic imaging of lung 06/08/2018  . Thyromegaly 10/28/2017  . Pain in both lower extremities 10/12/2017  . Greater trochanteric bursitis of left hip 07/26/2017  . Hypokalemia 04/18/2017  . Degenerative arthritis of left knee 12/01/2016  . Pilonidal cyst 12/01/2016  . Anxiety 12/01/2016  . Prediabetes 09/16/2016  . Sleep difficulties 09/16/2016  . Bursitis of right shoulder 09/04/2015  . Lateral meniscus derangement 06/03/2015  . Tear of LCL (lateral collateral ligament) of knee 06/03/2015  . Lumbar radiculopathy 09/19/2014  . Low back pain 01/04/2014  . Bilateral knee pain 01/04/2014  . Diverticulosis of colon without hemorrhage 07/26/2013  . Trigger thumb of left hand 03/06/2013  . Essential hypertension, benign 02/15/2013  . Hyperlipidemia 09/28/2012  . COPD (chronic obstructive pulmonary disease) ([place]) 05/22/2012  . Frequent  falls 05/22/2012  . Osteoporosis 05/22/2012  . Atypical chest pain 11/12/2011    Current Outpatient Medications on File Prior to Visit  Medication Sig Dispense Refill  . acyclovir (ZOVIRAX) 400 MG tablet Take 1 tablet (400 mg total) by mouth 3 (three) times daily. 21 tablet 0  . ALPRAZolam (XANAX) 0.5 MG tablet TAKE 1 TABLET(0.5 MG) BY MOUTH AT BEDTIME AS NEEDED FOR ANXIETY 30 tablet 0  . amLODipine (NORVASC) 5 MG tablet TAKE 1 TABLET(5 MG) BY MOUTH DAILY 90 tablet 1  . aspirin EC 81 MG tablet Take 81 mg by mouth daily.    [name] atorvastatin (LIPITOR) 40 MG tablet Take 0.5 tablets (20 mg total) by mouth every morning. -- Office visit needed for further refills 90 tablet 0  . Cholecalciferol (VITAMIN D3) 10000 units TABS Take 1 tablet by mouth.    . Cyanocobalamin (VITAMIN B-12) 2500 MCG SUBL Place 1 tablet under the tongue daily.    . ferrous sulfate 325 (65 FE) MG tablet Take 1 tablet (325 mg total) by mouth daily with breakfast. 30 tablet 3  . gabapentin (NEURONTIN) 300 MG capsule Take 1 capsule (300 mg total) by mouth at bedtime. 30 capsule 3  . hydrochlorothiazide (HYDRODIURIL) 25 MG tablet TAKE 1 TABLET(25 MG) BY MOUTH EVERY MORNING 90 tablet 3  . lisinopril (PRINIVIL,ZESTRIL) 40 MG tablet TAKE 1 TABLET(40 MG) BY MOUTH DAILY 90 tablet 1  . Magnesium 100 MG CAPS Take 100 mg by mouth daily.    . meloxicam (MOBIC) 7.5 MG tablet Take 1 tablet (7.5 mg  total) by mouth daily. 30 tablet 0  . potassium chloride (KLOR-CON) 8 MEQ tablet TAKE 2 TABLETS(16 MEQ) BY MOUTH DAILY. FOLLOW-UP APPOINTMENT DUE IN APRIL 60 tablet 0  . PROLIA 60 MG/ML SOSY injection INJECT 60 MG INTO THE SKIN ONCE FOR 1 DOSE 1 mL 0  . tiZANidine (ZANAFLEX) 4 MG tablet   2  . TURMERIC PO Take 1 tablet by mouth daily.     No current facility-administered medications on file prior to visit.     Past Medical History:  Diagnosis Date  . Arthritis   . Bursitis    left elbow  . Clostridium difficile infection   . Frequent falls    . HLD (hyperlipidemia)   . Hypertension   . Infectious colitis     Past Surgical History:  Procedure Laterality Date  . COLONOSCOPY    . ORIF PROXIMAL TIBIAL PLATEAU FRACTURE Left 02/19/2012   knee  . POLYPECTOMY    . TONSILLECTOMY      Social History   Socioeconomic History  . Marital status: Divorced    Spouse name: Not on file  . Number of children: 2  . Years of education: Not on file  . Highest education level: Not on file  Occupational History  . Occupation: RETIRED  Social Needs  . Financial resource strain: Not hard at all  . Food insecurity:    Worry: Never true    Inability: Never true  . Transportation needs:    Medical: No    Non-medical: No  Tobacco Use  . Smoking status: Former Smoker    Packs/day: 0.25    Years: 50.00    Pack years: 12.50    Types: Cigarettes    Last attempt to quit: 08/28/2014    Years since quitting: 3.8  . Smokeless tobacco: Never Used  . Tobacco comment: 1 pack every 3 days  Substance and Sexual Activity  . Alcohol use: No    Alcohol/week: 0.0 standard drinks  . Drug use: No  . Sexual activity: Not Currently  Lifestyle  . Physical activity:    Days per week: 4 days    Minutes per session: 40 min  . Stress: Not at all  Relationships  . Social connections:    Talks on phone: More than three times a week    Gets together: More than three times a week    Attends religious service: More than 4 times per year    Active member of club or organization: Yes    Attends meetings of clubs or organizations: More than 4 times per year    Relationship status: Not on file  Other Topics Concern  . Not on file  Social History Narrative   Lives alone has 2 supportive children.       Family History  Problem Relation Age of Onset  . Diverticulosis Mother   . Ovarian cancer Mother   . Pancreatic cancer Father   . Lung cancer Father   . Colon polyps Sister   . Colon cancer Neg Hx     Review of Systems     Objective:  There  were no vitals filed for this visit. BP Readings from Last 3 Encounters:  06/09/18 138/78  06/02/18 (!) 145/88  05/22/18 112/62   Wt Readings from Last 3 Encounters:  06/09/18 173 lb (78.5 kg)  06/01/18 171 lb 15.3 oz (78 kg)  05/22/18 173 lb (78.5 kg)   There is no height or weight on file to calculate BMI.  Physical Exam    Constitutional: Appears well-developed and well-nourished. No distress.  HENT:  Head: Normocephalic and atraumatic.  Neck: Neck supple. No tracheal deviation present. No thyromegaly present.  No cervical lymphadenopathy Cardiovascular: Normal rate, regular rhythm and normal heart sounds.   No murmur heard. No carotid bruit .  No edema Pulmonary/Chest: Effort normal and breath sounds normal. No respiratory distress. No has no wheezes. No rales.  Skin: Skin is warm and dry. Not diaphoretic.  Psychiatric: Normal mood and affect. Behavior is normal.      Assessment & Plan:    See Problem List for Assessment and Plan of chronic medical problems.

## 2018-06-27 ENCOUNTER — Ambulatory Visit: Payer: Medicare Other | Admitting: Internal Medicine

## 2018-06-28 ENCOUNTER — Other Ambulatory Visit: Payer: Self-pay

## 2018-06-28 MED ORDER — DENOSUMAB 60 MG/ML ~~LOC~~ SOSY: PREFILLED_SYRINGE | SUBCUTANEOUS | 0 refills | Status: DC

## 2018-06-29 ENCOUNTER — Ambulatory Visit
Admission: RE | Admit: 2018-06-29 | Discharge: 2018-06-29 | Disposition: A | Discharge status: Home / Self Care | Payer: Medicare Other | Source: Ambulatory Visit | Attending: Family Medicine | Admitting: Family Medicine

## 2018-06-29 ENCOUNTER — Other Ambulatory Visit: Payer: Self-pay

## 2018-06-29 DIAGNOSIS — M5416 Radiculopathy, lumbar region: Secondary | ICD-10-CM

## 2018-06-29 DIAGNOSIS — M4726 Other spondylosis with radiculopathy, lumbar region: Secondary | ICD-10-CM | POA: Diagnosis not present

## 2018-06-29 MED ORDER — METHYLPREDNISOLONE ACETATE 40 MG/ML INJ SUSP (RADIOLOG
120.0000 mg | Freq: Once | INTRAMUSCULAR | Status: AC
  Administered 2018-06-29: 120 mg via EPIDURAL

## 2018-06-29 MED ORDER — IOPAMIDOL (ISOVUE-M 200) INJECTION 41%
1.0000 mL | Freq: Once | INTRAMUSCULAR | Status: AC
  Administered 2018-06-29: 1 mL via EPIDURAL

## 2018-06-29 NOTE — Discharge Instructions (Signed)

## 2018-06-30 DIAGNOSIS — H25011 Cortical age-related cataract, right eye: Secondary | ICD-10-CM | POA: Diagnosis not present

## 2018-07-05 DIAGNOSIS — D2372 Other benign neoplasm of skin of left lower limb, including hip: Secondary | ICD-10-CM | POA: Diagnosis not present

## 2018-07-06 NOTE — Progress Notes (Signed)
Subjective:    Patient ID: Kristina Ayala, female    DOB: Jul 12, 1945, 73 y.o.   MRN: [id number]  HPI The patient is here for follow up.   She is exercising regularly - floor bike.    Hypertension: She is taking her medication daily. She is compliant with a low sodium diet.  She denies chest pain, palpitations, edema, shortness of breath and regular headaches.     Hyperlipidemia: She is taking her medication daily. She is compliant with a low fat/cholesterol diet. She denies myalgias.   Prediabetes:  She is compliant with a low sugar/carbohydrate diet.  She is exercising regularly.  Anxiety: She is taking her xanax on a rare occasion. She denies any side effects from the medication. She feels her anxiety is well controlled with only as needed medication - most of the time she denies anxiety.   Osteoporosis:  She is taking calcium and vitamin d daily.  She is exercising.  She is getting prolia and is due for an injection today.     Medications and allergies reviewed with patient and updated if appropriate.  Patient Active Problem List   Diagnosis Date Noted  . Lung nodules 06/19/2018  . Abnormal findings on diagnostic imaging of lung 06/08/2018  . Thyromegaly 10/28/2017  . Pain in both lower extremities 10/12/2017  . Greater trochanteric bursitis of left hip 07/26/2017  . Hypokalemia 04/18/2017  . Degenerative arthritis of left knee 12/01/2016  . Pilonidal cyst 12/01/2016  . Prediabetes 09/16/2016  . Sleep difficulties 09/16/2016  . Bursitis of right shoulder 09/04/2015  . Lateral meniscus derangement 06/03/2015  . Tear of LCL (lateral collateral ligament) of knee 06/03/2015  . Lumbar radiculopathy 09/19/2014  . Low back pain 01/04/2014  . Bilateral knee pain 01/04/2014  . Diverticulosis of colon without hemorrhage 07/26/2013  . Trigger thumb of left hand 03/06/2013  . Essential hypertension, benign 02/15/2013  . Hyperlipidemia 09/28/2012  . COPD (chronic  obstructive pulmonary disease) ([place]) 05/22/2012  . Frequent falls 05/22/2012  . Osteoporosis 05/22/2012  . Atypical chest pain 11/12/2011    Current Outpatient Medications on File Prior to Visit  Medication Sig Dispense Refill  . acyclovir (ZOVIRAX) 400 MG tablet Take 1 tablet (400 mg total) by mouth 3 (three) times daily. 21 tablet 0  . ALPRAZolam (XANAX) 0.5 MG tablet TAKE 1 TABLET(0.5 MG) BY MOUTH AT BEDTIME AS NEEDED FOR ANXIETY 30 tablet 0  . amLODipine (NORVASC) 5 MG tablet TAKE 1 TABLET(5 MG) BY MOUTH DAILY 90 tablet 1  . aspirin EC 81 MG tablet Take 81 mg by mouth daily.    [name] atorvastatin (LIPITOR) 40 MG tablet Take 0.5 tablets (20 mg total) by mouth every morning. -- Office visit needed for further refills 90 tablet 0  . Cholecalciferol (VITAMIN D3) 10000 units TABS Take 1 tablet by mouth.    . Cyanocobalamin (VITAMIN B-12) 2500 MCG SUBL Place 1 tablet under the tongue daily.    [name] denosumab (PROLIA) 60 MG/ML SOSY injection INJECT 60 MG INTO THE SKIN ONCE FOR 1 DOSE 1 mL 0  . ferrous sulfate 325 (65 FE) MG tablet Take 1 tablet (325 mg total) by mouth daily with breakfast. 30 tablet 3  . gabapentin (NEURONTIN) 300 MG capsule Take 1 capsule (300 mg total) by mouth at bedtime. 30 capsule 3  . hydrochlorothiazide (HYDRODIURIL) 25 MG tablet TAKE 1 TABLET(25 MG) BY MOUTH EVERY MORNING 90 tablet 3  . lisinopril (PRINIVIL,ZESTRIL) 40 MG tablet TAKE 1 TABLET(40 MG)  BY MOUTH DAILY 90 tablet 1  . Magnesium 100 MG CAPS Take 100 mg by mouth daily.    . meloxicam (MOBIC) 7.5 MG tablet Take 1 tablet (7.5 mg total) by mouth daily. 30 tablet 0  . potassium chloride (KLOR-CON) 8 MEQ tablet TAKE 2 TABLETS(16 MEQ) BY MOUTH DAILY. FOLLOW-UP APPOINTMENT DUE IN APRIL 60 tablet 0  . tiZANidine (ZANAFLEX) 4 MG tablet   2  . TURMERIC PO Take 1 tablet by mouth daily.     No current facility-administered medications on file prior to visit.     Past Medical History:  Diagnosis Date  . Arthritis   .  Bursitis    left elbow  . Clostridium difficile infection   . Frequent falls   . HLD (hyperlipidemia)   . Hypertension   . Infectious colitis     Past Surgical History:  Procedure Laterality Date  . COLONOSCOPY    . ORIF PROXIMAL TIBIAL PLATEAU FRACTURE Left 02/19/2012   knee  . POLYPECTOMY    . TONSILLECTOMY      Social History   Socioeconomic History  . Marital status: Divorced    Spouse name: Not on file  . Number of children: 2  . Years of education: Not on file  . Highest education level: Not on file  Occupational History  . Occupation: RETIRED  Social Needs  . Financial resource strain: Not hard at all  . Food insecurity    Worry: Never true    Inability: Never true  . Transportation needs    Medical: No    Non-medical: No  Tobacco Use  . Smoking status: Former Smoker    Packs/day: 0.25    Years: 50.00    Pack years: 12.50    Types: Cigarettes    Quit date: 08/28/2014    Years since quitting: 3.8  . Smokeless tobacco: Never Used  . Tobacco comment: 1 pack every 3 days  Substance and Sexual Activity  . Alcohol use: No    Alcohol/week: 0.0 standard drinks  . Drug use: No  . Sexual activity: Not Currently  Lifestyle  . Physical activity    Days per week: 4 days    Minutes per session: 40 min  . Stress: Not at all  Relationships  . Social connections    Talks on phone: More than three times a week    Gets together: More than three times a week    Attends religious service: More than 4 times per year    Active member of club or organization: Yes    Attends meetings of clubs or organizations: More than 4 times per year    Relationship status: Not on file  Other Topics Concern  . Not on file  Social History Narrative   Lives alone has 2 supportive children.       Family History  Problem Relation Age of Onset  . Diverticulosis Mother   . Ovarian cancer Mother   . Pancreatic cancer Father   . Lung cancer Father   . Colon polyps Sister   . Colon  cancer Neg Hx     Review of Systems  Constitutional: Negative for chills and fever.  Respiratory: Negative for cough, shortness of breath and wheezing.   Cardiovascular: Negative for chest pain, palpitations and leg swelling.  Neurological: Negative for light-headedness and headaches.       Objective:   Vitals:   07/07/18 1023  BP: 124/78  Pulse: 91  Resp: 16  Temp: 99.7  F (37.6 C)  SpO2: 97%   BP Readings from Last 3 Encounters:  07/07/18 124/78  06/29/18 (!) 162/89  06/09/18 138/78   Wt Readings from Last 3 Encounters:  07/07/18 173 lb (78.5 kg)  06/09/18 173 lb (78.5 kg)  06/01/18 171 lb 15.3 oz (78 kg)   Body mass index is 28.79 kg/m.   Physical Exam    Constitutional: Appears well-developed and well-nourished. No distress.  HENT:  Head: Normocephalic and atraumatic.  Neck: Neck supple. No tracheal deviation present. No thyromegaly present.  No cervical lymphadenopathy Cardiovascular: Normal rate, regular rhythm and normal heart sounds.   No murmur heard. No carotid bruit .  No edema Pulmonary/Chest: Effort normal and breath sounds normal. No respiratory distress. No has no wheezes. No rales.  Skin: Skin is warm and dry. Not diaphoretic.  Psychiatric: Normal mood and affect. Behavior is normal.      Assessment & Plan:    See Problem List for Assessment and Plan of chronic medical problems.

## 2018-07-06 NOTE — Assessment & Plan Note (Addendum)
Dexa due - ordered Exercising regularly Taking calcium and vitamin D daily Prolia injection today

## 2018-07-06 NOTE — Patient Instructions (Addendum)
  Tests ordered today. Your results will be released to [place] (or called to you) after review, usually within 72hours after test completion. If any changes need to be made, you will be notified at that same time.  A bone density was ordered  Medications reviewed and updated.  Changes include :   none  Your prescription(s) have been submitted to your pharmacy. Please take as directed and contact our office if you believe you are having problem(s) with the medication(s).  Please followup in 6 months

## 2018-07-07 ENCOUNTER — Other Ambulatory Visit (INDEPENDENT_AMBULATORY_CARE_PROVIDER_SITE_OTHER): Payer: Medicare Other

## 2018-07-07 ENCOUNTER — Ambulatory Visit: Payer: Medicare Other

## 2018-07-07 ENCOUNTER — Ambulatory Visit (INDEPENDENT_AMBULATORY_CARE_PROVIDER_SITE_OTHER): Payer: Medicare Other | Admitting: Internal Medicine

## 2018-07-07 ENCOUNTER — Ambulatory Visit (INDEPENDENT_AMBULATORY_CARE_PROVIDER_SITE_OTHER)
Admission: RE | Admit: 2018-07-07 | Discharge: 2018-07-07 | Disposition: A | Discharge status: Home / Self Care | Payer: Medicare Other | Source: Ambulatory Visit | Attending: Internal Medicine | Admitting: Internal Medicine

## 2018-07-07 ENCOUNTER — Other Ambulatory Visit: Payer: Self-pay

## 2018-07-07 ENCOUNTER — Encounter: Payer: Self-pay | Admitting: Internal Medicine

## 2018-07-07 VITALS — BP 124/78 | HR 91 | Temp 99.7°F | Resp 16 | Ht 65.0 in | Wt 173.0 lb

## 2018-07-07 DIAGNOSIS — M810 Age-related osteoporosis without current pathological fracture: Secondary | ICD-10-CM

## 2018-07-07 DIAGNOSIS — R7303 Prediabetes: Secondary | ICD-10-CM

## 2018-07-07 DIAGNOSIS — I10 Essential (primary) hypertension: Secondary | ICD-10-CM

## 2018-07-07 DIAGNOSIS — E782 Mixed hyperlipidemia: Secondary | ICD-10-CM

## 2018-07-07 DIAGNOSIS — F419 Anxiety disorder, unspecified: Secondary | ICD-10-CM

## 2018-07-07 LAB — COMPREHENSIVE METABOLIC PANEL
ALT: 27 U/L (ref 0–35)
AST: 17 U/L (ref 0–37)
Albumin: 4.5 g/dL (ref 3.5–5.2)
Alkaline Phosphatase: 50 U/L (ref 39–117)
BUN: 20 mg/dL (ref 6–23)
CO2: 29 mEq/L (ref 19–32)
Calcium: 9.8 mg/dL (ref 8.4–10.5)
Chloride: 101 mEq/L (ref 96–112)
Creatinine, Ser: 0.81 mg/dL (ref 0.40–1.20)
GFR: 83.56 mL/min (ref >60.00)
Glucose, Bld: 88 mg/dL (ref 70–99)
Potassium: 4.4 mEq/L (ref 3.5–5.1)
Sodium: 140 mEq/L (ref 135–145)
Total Bilirubin: 0.5 mg/dL (ref 0.2–1.2)
Total Protein: 7.4 g/dL (ref 6.0–8.3)

## 2018-07-07 LAB — LIPID PANEL
Cholesterol: 173 mg/dL (ref 0–200)
HDL: 61.3 mg/dL (ref >39.00)
LDL Cholesterol: 90 mg/dL (ref 0–99)
NonHDL: 112.1
Total CHOL/HDL Ratio: 3
Triglycerides: 110 mg/dL (ref 0.0–149.0)
VLDL: 22 mg/dL (ref 0.0–40.0)

## 2018-07-07 LAB — VITAMIN D 25 HYDROXY (VIT D DEFICIENCY, FRACTURES): VITD: 46.98 ng/mL (ref 30.00–100.00)

## 2018-07-07 LAB — HEMOGLOBIN A1C: Hgb A1c MFr Bld: 6.3 % (ref 4.6–6.5)

## 2018-07-07 MED ORDER — HYDROCHLOROTHIAZIDE 25 MG PO TABS: ORAL_TABLET | ORAL | 3 refills | Status: DC

## 2018-07-07 MED ORDER — DENOSUMAB 60 MG/ML ~~LOC~~ SOSY
60.0000 mg | PREFILLED_SYRINGE | Freq: Once | SUBCUTANEOUS | Status: AC
  Administered 2018-07-07: 60 mg via SUBCUTANEOUS

## 2018-07-07 NOTE — Assessment & Plan Note (Signed)
Check lipid panel  Continue daily statin Regular exercise and healthy diet encouraged  

## 2018-07-07 NOTE — Assessment & Plan Note (Signed)
Check a1c Low sugar / carb diet Stressed regular exercise   

## 2018-07-07 NOTE — Assessment & Plan Note (Signed)
Continue xanax only as needed - takes it rarely

## 2018-07-07 NOTE — Assessment & Plan Note (Signed)
BP well controlled Current regimen effective and well tolerated Continue current medications at current doses cmp  

## 2018-07-07 NOTE — Addendum Note (Signed)
Addended by: Delice Bison E on: 07/07/2018 11:25 AM   Modules accepted: Orders

## 2018-07-21 ENCOUNTER — Ambulatory Visit: Payer: Medicare Other

## 2018-07-22 NOTE — Progress Notes (Deleted)
[name] [address], [place] [address] Phone: [phone] Subjective:    I'm seeing this patient by the request  of:    CC: Left hip pain  [id number]  Kristina Ayala is a 73 y.o. female coming in with complaint of ***  Onset-  Location Duration-  Character- Aggravating factors- Reliving factors-  Therapies tried-  Severity-     Past Medical History:  Diagnosis Date  . Arthritis   . Bursitis    left elbow  . Clostridium difficile infection   . Frequent falls   . HLD (hyperlipidemia)   . Hypertension   . Infectious colitis    Past Surgical History:  Procedure Laterality Date  . COLONOSCOPY    . ORIF PROXIMAL TIBIAL PLATEAU FRACTURE Left 02/19/2012   knee  . POLYPECTOMY    . TONSILLECTOMY     Social History   Socioeconomic History  . Marital status: Divorced    Spouse name: Not on file  . Number of children: 2  . Years of education: Not on file  . Highest education level: Not on file  Occupational History  . Occupation: RETIRED  Social Needs  . Financial resource strain: Not hard at all  . Food insecurity    Worry: Never true    Inability: Never true  . Transportation needs    Medical: No    Non-medical: No  Tobacco Use  . Smoking status: Former Smoker    Packs/day: 0.25    Years: 50.00    Pack years: 12.50    Types: Cigarettes    Quit date: 08/28/2014    Years since quitting: 3.9  . Smokeless tobacco: Never Used  . Tobacco comment: 1 pack every 3 days  Substance and Sexual Activity  . Alcohol use: No    Alcohol/week: 0.0 standard drinks  . Drug use: No  . Sexual activity: Not Currently  Lifestyle  . Physical activity    Days per week: 4 days    Minutes per session: 40 min  . Stress: Not at all  Relationships  . Social connections    Talks on phone: More than three times a week    Gets together: More than three times a week    Attends religious service: More than 4 times per year   Active member of club or organization: Yes    Attends meetings of clubs or organizations: More than 4 times per year    Relationship status: Not on file  Other Topics Concern  . Not on file  Social History Narrative   Lives alone has 2 supportive children.      Allergies  Allergen Reactions  . Doxycycline Diarrhea  . Minocycline Diarrhea  . Wellbutrin [Bupropion] Swelling    Per pt her tongue was swollen and sore/symptoms stopped once the medication was discontinued.   [name] Penicillins Rash    Has patient had a PCN reaction causing immediate rash, facial/tongue/throat swelling, SOB or lightheadedness with hypotension: yes Has patient had a PCN reaction causing severe rash involving mucus membranes or skin necrosis: no Has patient had a PCN reaction that required hospitalization: unknown Has patient had a PCN reaction occurring within the last 10 years: no If all of the above answers are "NO", then may proceed with Cephalosporin use.    Family History  Problem Relation Age of Onset  . Diverticulosis Mother   . Ovarian cancer Mother   . Pancreatic cancer Father   . Lung  cancer Father   . Colon polyps Sister   . Colon cancer Neg Hx     Current Outpatient Medications (Endocrine & Metabolic):  .  denosumab (PROLIA) 60 MG/ML SOSY injection, INJECT 60 MG INTO THE SKIN ONCE FOR 1 DOSE  Current Outpatient Medications (Cardiovascular):  .  amLODipine (NORVASC) 5 MG tablet, TAKE 1 TABLET(5 MG) BY MOUTH DAILY .  atorvastatin (LIPITOR) 40 MG tablet, Take 0.5 tablets (20 mg total) by mouth every morning. -- Office visit needed for further refills .  hydrochlorothiazide (HYDRODIURIL) 25 MG tablet, TAKE 1 TABLET(25 MG) BY MOUTH EVERY MORNING .  lisinopril (PRINIVIL,ZESTRIL) 40 MG tablet, TAKE 1 TABLET(40 MG) BY MOUTH DAILY   Current Outpatient Medications (Analgesics):  .  aspirin EC 81 MG tablet, Take 81 mg by mouth daily. .  meloxicam (MOBIC) 7.5 MG tablet, Take 1 tablet (7.5 mg total) by  mouth daily.  Current Outpatient Medications (Hematological):  [name]  Cyanocobalamin (VITAMIN B-12) 2500 MCG SUBL, Place 1 tablet under the tongue daily. .  ferrous sulfate 325 (65 FE) MG tablet, Take 1 tablet (325 mg total) by mouth daily with breakfast.  Current Outpatient Medications (Other):  .  acyclovir (ZOVIRAX) 400 MG tablet, Take 1 tablet (400 mg total) by mouth 3 (three) times daily. [name]  ALPRAZolam (XANAX) 0.5 MG tablet, TAKE 1 TABLET(0.5 MG) BY MOUTH AT BEDTIME AS NEEDED FOR ANXIETY .  Cholecalciferol (VITAMIN D3) 10000 units TABS, Take 1 tablet by mouth. .  gabapentin (NEURONTIN) 300 MG capsule, Take 1 capsule (300 mg total) by mouth at bedtime. .  Magnesium 100 MG CAPS, Take 100 mg by mouth daily. .  potassium chloride (KLOR-CON) 8 MEQ tablet, TAKE 2 TABLETS(16 MEQ) BY MOUTH DAILY. FOLLOW-UP APPOINTMENT DUE IN APRIL .  tiZANidine (ZANAFLEX) 4 MG tablet,  .  TURMERIC PO, Take 1 tablet by mouth daily.    Past medical history, social, surgical and family history all reviewed in electronic medical record.  No pertanent information unless stated regarding to the chief complaint.   Review of Systems:  No headache, visual changes, nausea, vomiting, diarrhea, constipation, dizziness, abdominal pain, skin rash, fevers, chills, night sweats, weight loss, swollen lymph nodes, body aches, joint swelling, muscle aches, chest pain, shortness of breath, mood changes.   Objective  There were no vitals taken for this visit.    General: No apparent distress alert and oriented x3 mood and affect normal, dressed appropriately.  HEENT: Pupils equal, extraocular movements intact  Respiratory: Patient's speak in full sentences and does not appear short of breath  Cardiovascular: No lower extremity edema, non tender, no erythema  Skin: Warm dry intact with no signs of infection or rash on extremities or on axial skeleton.  Abdomen: Soft nontender  Neuro: Cranial nerves II through XII are intact,  neurovascularly intact in all extremities with 2+ DTRs and 2+ pulses.  Lymph: No lymphadenopathy of posterior or anterior cervical chain or axillae bilaterally.  Gait normal with good balance and coordination.  MSK:  Non tender with full range of motion and good stability and symmetric strength and tone of shoulders, elbows, wrist,  knee and ankles bilaterally. Hip exam shows some tenderness over the lateral aspect of the hip.  Positive Corky Sox.  Mild positive straight leg test.     Procedure: Real-time Ultrasound Guided Injection of left  greater trochanteric bursitis secondary to patient's body habitus Device: GE Logiq Q7  Ultrasound guided injection is preferred based studies that show increased duration, increased effect, greater accuracy,  decreased procedural pain, increased response rate, and decreased cost with ultrasound guided versus blind injection.  Verbal informed consent obtained.  Time-out conducted.  Noted no overlying erythema, induration, or other signs of local infection.  Skin prepped in a sterile fashion.  Local anesthesia: Topical Ethyl chloride.  With sterile technique and under real time ultrasound guidance:  Greater trochanteric area was visualized and patient's bursa was noted. A 22-gauge 3 inch needle was inserted and 4 cc of 0.5% Marcaine and 1 cc of Kenalog 40 mg/dL was injected. Pictures taken Completed without difficulty  Pain immediately resolved suggesting accurate placement of the medication.  Advised to call if fevers/chills, erythema, induration, drainage, or persistent bleeding.  Images permanently stored and available for review in the ultrasound unit.  Impression: Technically successful ultrasound guided injection.      Impression and Recommendations:     This case required medical decision making of moderate complexity. The above documentation has been reviewed and is accurate and complete Lyndal Pulley, DO       Note: This dictation was  prepared with Dragon dictation along with smaller phrase technology. Any transcriptional errors that result from this process are unintentional.

## 2018-07-24 ENCOUNTER — Ambulatory Visit
Admission: RE | Admit: 2018-07-24 | Discharge: 2018-07-24 | Disposition: A | Discharge status: Home / Self Care | Payer: Medicare Other | Source: Ambulatory Visit | Attending: Internal Medicine | Admitting: Internal Medicine

## 2018-07-24 ENCOUNTER — Other Ambulatory Visit: Payer: Self-pay

## 2018-07-24 ENCOUNTER — Ambulatory Visit: Payer: Medicare Other | Admitting: Family Medicine

## 2018-07-24 DIAGNOSIS — Z1231 Encounter for screening mammogram for malignant neoplasm of breast: Secondary | ICD-10-CM

## 2018-07-25 ENCOUNTER — Other Ambulatory Visit: Payer: Self-pay | Admitting: Internal Medicine

## 2018-07-25 DIAGNOSIS — R928 Other abnormal and inconclusive findings on diagnostic imaging of breast: Secondary | ICD-10-CM

## 2018-08-02 DIAGNOSIS — H2513 Age-related nuclear cataract, bilateral: Secondary | ICD-10-CM | POA: Diagnosis not present

## 2018-08-02 DIAGNOSIS — H02403 Unspecified ptosis of bilateral eyelids: Secondary | ICD-10-CM | POA: Diagnosis not present

## 2018-08-02 DIAGNOSIS — H524 Presbyopia: Secondary | ICD-10-CM | POA: Diagnosis not present

## 2018-08-03 ENCOUNTER — Ambulatory Visit
Admission: RE | Admit: 2018-08-03 | Discharge: 2018-08-03 | Disposition: A | Discharge status: Home / Self Care | Payer: Medicare Other | Source: Ambulatory Visit | Attending: Internal Medicine | Admitting: Internal Medicine

## 2018-08-03 ENCOUNTER — Other Ambulatory Visit: Payer: Self-pay

## 2018-08-03 DIAGNOSIS — R928 Other abnormal and inconclusive findings on diagnostic imaging of breast: Secondary | ICD-10-CM

## 2018-08-03 DIAGNOSIS — N6310 Unspecified lump in the right breast, unspecified quadrant: Secondary | ICD-10-CM | POA: Diagnosis not present

## 2018-08-07 ENCOUNTER — Other Ambulatory Visit: Payer: Self-pay | Admitting: Internal Medicine

## 2018-08-08 ENCOUNTER — Other Ambulatory Visit: Payer: Self-pay

## 2018-08-08 NOTE — Patient Outreach (Signed)
[facility]) Care Management  08/08/2018  Karine A Candler 04-30-1945 [id number]   Medication Adherence call to Mrs. Cyenna Fendley .HIPPA Compliant Voice message left with a call back number.Mrs. Sabourin is showing past due on Atorvastatin 40 mg and Lisinopril 40 mg under [place].   [place] [facility] Direct Dial [phone]  Fax [phone] [email or web address]

## 2018-08-17 ENCOUNTER — Other Ambulatory Visit: Payer: Self-pay | Admitting: Internal Medicine

## 2018-08-30 ENCOUNTER — Other Ambulatory Visit: Payer: Self-pay | Admitting: Internal Medicine

## 2018-08-31 DIAGNOSIS — H25812 Combined forms of age-related cataract, left eye: Secondary | ICD-10-CM | POA: Diagnosis not present

## 2018-08-31 DIAGNOSIS — H25012 Cortical age-related cataract, left eye: Secondary | ICD-10-CM | POA: Diagnosis not present

## 2018-08-31 DIAGNOSIS — H2512 Age-related nuclear cataract, left eye: Secondary | ICD-10-CM | POA: Diagnosis not present

## 2018-09-26 ENCOUNTER — Other Ambulatory Visit: Payer: Self-pay

## 2018-09-26 ENCOUNTER — Ambulatory Visit: Payer: Medicare Other | Admitting: Family Medicine

## 2018-09-26 ENCOUNTER — Encounter: Payer: Self-pay | Admitting: Family Medicine

## 2018-09-26 DIAGNOSIS — M1712 Unilateral primary osteoarthritis, left knee: Secondary | ICD-10-CM

## 2018-09-26 DIAGNOSIS — M7062 Trochanteric bursitis, left hip: Secondary | ICD-10-CM | POA: Diagnosis not present

## 2018-09-26 DIAGNOSIS — R6889 Other general symptoms and signs: Secondary | ICD-10-CM | POA: Diagnosis not present

## 2018-09-26 NOTE — Patient Instructions (Addendum)
Good to see you 4,000-5,000 IU of vitamin d daily Voltaren gel over the counter  See me again in 3 months

## 2018-09-26 NOTE — Assessment & Plan Note (Signed)
Repeat injection given today.  Tolerated the procedure well.  Discussed icing regimen and home exercises, which activities to do which wants to avoid.  Patient is to increase activity follow-up again in 4 to 8 weeks patient could be a candidate for Visco supplementation otherwise we will continue to, as needed

## 2018-09-26 NOTE — Progress Notes (Signed)
[name] [address], [place] [address] Phone: [phone] Subjective:   I, [name] , am serving as a scribe for Dr. Hulan Saas.    CC: Left knee, left hip pain  [id number]  Oneita A Ayala is a 73 y.o. female coming in with complaint of hip and knee pain. States that she would like an injection. No known arthritic changes of the knee previously.  Some increasing instability.  More on the lateral aspect medially.  Patient has had difficulty with the LCL as well.  Last injection was greater than 8 months ago  Left hip pain.  Only started after walking differently because of the knee.  Starting to wake her up at night.  Rates the severity of pain is 7 out of 10.  Mild radicular symptoms down the leg but nothing that is constant.  Feels that the instability of the leg is secondary more to the knee than to the back of her hip.     Past Medical History:  Diagnosis Date  . Arthritis   . Bursitis    left elbow  . Clostridium difficile infection   . Frequent falls   . HLD (hyperlipidemia)   . Hypertension   . Infectious colitis    Past Surgical History:  Procedure Laterality Date  . COLONOSCOPY    . ORIF PROXIMAL TIBIAL PLATEAU FRACTURE Left 02/19/2012   knee  . POLYPECTOMY    . TONSILLECTOMY     Social History   Socioeconomic History  . Marital status: Divorced    Spouse name: Not on file  . Number of children: 2  . Years of education: Not on file  . Highest education level: Not on file  Occupational History  . Occupation: RETIRED  Social Needs  . Financial resource strain: Not hard at all  . Food insecurity    Worry: Never true    Inability: Never true  . Transportation needs    Medical: No    Non-medical: No  Tobacco Use  . Smoking status: Former Smoker    Packs/day: 0.25    Years: 50.00    Pack years: 12.50    Types: Cigarettes    Quit date: 08/28/2014    Years since quitting: 4.0  . Smokeless tobacco:  Never Used  . Tobacco comment: 1 pack every 3 days  Substance and Sexual Activity  . Alcohol use: No    Alcohol/week: 0.0 standard drinks  . Drug use: No  . Sexual activity: Not Currently  Lifestyle  . Physical activity    Days per week: 4 days    Minutes per session: 40 min  . Stress: Not at all  Relationships  . Social connections    Talks on phone: More than three times a week    Gets together: More than three times a week    Attends religious service: More than 4 times per year    Active member of club or organization: Yes    Attends meetings of clubs or organizations: More than 4 times per year    Relationship status: Not on file  Other Topics Concern  . Not on file  Social History Narrative   Lives alone has 2 supportive children.      Allergies  Allergen Reactions  . Doxycycline Diarrhea  . Minocycline Diarrhea  . Wellbutrin [Bupropion] Swelling    Per pt her tongue was swollen and sore/symptoms stopped once the medication was discontinued.   [name] Penicillins  Rash    Has patient had a PCN reaction causing immediate rash, facial/tongue/throat swelling, SOB or lightheadedness with hypotension: yes Has patient had a PCN reaction causing severe rash involving mucus membranes or skin necrosis: no Has patient had a PCN reaction that required hospitalization: unknown Has patient had a PCN reaction occurring within the last 10 years: no If all of the above answers are "NO", then may proceed with Cephalosporin use.    Family History  Problem Relation Age of Onset  . Diverticulosis Mother   . Ovarian cancer Mother   . Pancreatic cancer Father   . Lung cancer Father   . Colon polyps Sister   . Colon cancer Neg Hx     Current Outpatient Medications (Endocrine & Metabolic):  .  denosumab (PROLIA) 60 MG/ML SOSY injection, INJECT 60 MG INTO THE SKIN ONCE FOR 1 DOSE  Current Outpatient Medications (Cardiovascular):  .  amLODipine (NORVASC) 5 MG tablet, TAKE 1 TABLET(5 MG) BY  MOUTH DAILY .  atorvastatin (LIPITOR) 40 MG tablet, Take 0.5 tablets (20 mg total) by mouth every morning. -- Office visit needed for further refills .  hydrochlorothiazide (HYDRODIURIL) 25 MG tablet, TAKE 1 TABLET(25 MG) BY MOUTH EVERY MORNING .  lisinopril (ZESTRIL) 40 MG tablet, TAKE 1 TABLET(40 MG) BY MOUTH DAILY   Current Outpatient Medications (Analgesics):  .  aspirin EC 81 MG tablet, Take 81 mg by mouth daily. .  meloxicam (MOBIC) 7.5 MG tablet, Take 1 tablet (7.5 mg total) by mouth daily.  Current Outpatient Medications (Hematological):  [name]  Cyanocobalamin (VITAMIN B-12) 2500 MCG SUBL, Place 1 tablet under the tongue daily. .  ferrous sulfate 325 (65 FE) MG tablet, Take 1 tablet (325 mg total) by mouth daily with breakfast.  Current Outpatient Medications (Other):  .  acyclovir (ZOVIRAX) 400 MG tablet, Take 1 tablet (400 mg total) by mouth 3 (three) times daily. [name]  ALPRAZolam (XANAX) 0.5 MG tablet, TAKE 1 TABLET(0.5 MG) BY MOUTH AT BEDTIME AS NEEDED FOR ANXIETY .  Cholecalciferol (VITAMIN D3) 10000 units TABS, Take 1 tablet by mouth. .  gabapentin (NEURONTIN) 300 MG capsule, Take 1 capsule (300 mg total) by mouth at bedtime. .  Magnesium 100 MG CAPS, Take 100 mg by mouth daily. .  potassium chloride (KLOR-CON) 8 MEQ tablet, Take 2 tablets (16 MEQ) by mouth daily. [name]  tiZANidine (ZANAFLEX) 4 MG tablet,  .  TURMERIC PO, Take 1 tablet by mouth daily.    Past medical history, social, surgical and family history all reviewed in electronic medical record.  No pertanent information unless stated regarding to the chief complaint.   Review of Systems:  No headache, visual changes, nausea, vomiting, diarrhea, constipation, dizziness, abdominal pain, skin rash, fevers, chills, night sweats, weight loss, swollen lymph nodes, body aches, joint swelling,  chest pain, shortness of breath, mood changes.  Positive muscle aches  Objective  Blood pressure (!) 144/82, pulse 89, height 5\' 5"  (1.651  m), SpO2 98 %.   General: No apparent distress alert and oriented x3 mood and affect normal, dressed appropriately.  HEENT: Pupils equal, extraocular movements intact  Respiratory: Patient's speak in full sentences and does not appear short of breath  Cardiovascular: No lower extremity edema, non tender, no erythema  Skin: Warm dry intact with no signs of infection or rash on extremities or on axial skeleton.  Abdomen: Soft nontender  Neuro: Cranial nerves II through XII are intact, neurovascularly intact in all extremities with 2+ DTRs and 2+ pulses.  Lymph: No lymphadenopathy of posterior or anterior cervical chain or axillae bilaterally.  Gait severely antalgic MSK:  tender with movement range of motion and good stability and symmetric strength and tone of shoulders, elbows, wrist, , and ankles bilaterally.   Knee: Left Varus deformity noted.  Abnormal thigh to calf ratio.  Tender to palpation over medial and PF joint line.  ROM full in flexion and extension and lower leg rotation. instability with valgus force.  painful patellar compression. Patellar glide with moderate crepitus. Patellar and quadriceps tendons unremarkable. Hamstring and quadriceps strength is normal. Contralateral knee shows mild arthritic changes as well  Left hip pain is more on the lateral aspect of the hip.  Tender to palpation over the lateral hip  After informed written and verbal consent, patient was seated on exam table. Left knee was prepped with alcohol swab and utilizing anterolateral approach, patient's left knee space was injected with 4:1  marcaine 0.5%: Kenalog 40mg /dL. Patient tolerated the procedure well without immediate complications.  After verbal consent patient was prepped with alcohol swabs and with a 21-gauge 2 inch needle was injected into the left greater trochanteric area with a total of 0.5 cc of 0.5% Marcaine and 1 cc of Kenalog 40 mg/mL.  No blood loss.  Postinjection instructions given  after placing a Band-Aid.    Impression and Recommendations:     This case required medical decision making of moderate complexity. The above documentation has been reviewed and is accurate and complete Lyndal Pulley, DO       Note: This dictation was prepared with Dragon dictation along with smaller phrase technology. Any transcriptional errors that result from this process are unintentional.

## 2018-09-26 NOTE — Assessment & Plan Note (Signed)
Repeat injection given today.  I do believe it is more secondary to compensation for the knee.  Discussed posture and ergonomics, we discussed hip abductor strengthening, patient is now caring for her grandchild which she thinks is causing more repetitive motions.

## 2018-09-28 ENCOUNTER — Other Ambulatory Visit: Payer: Self-pay | Admitting: *Deleted

## 2018-09-28 MED ORDER — VITAMIN D3 50 MCG (2000 UT) PO CAPS: 4000.0000 [IU] | ORAL_CAPSULE | Freq: Every day | ORAL | 0 refills | Status: DC

## 2018-10-05 DIAGNOSIS — H2511 Age-related nuclear cataract, right eye: Secondary | ICD-10-CM | POA: Diagnosis not present

## 2018-10-05 DIAGNOSIS — H25812 Combined forms of age-related cataract, left eye: Secondary | ICD-10-CM | POA: Diagnosis not present

## 2018-10-06 DIAGNOSIS — R6889 Other general symptoms and signs: Secondary | ICD-10-CM | POA: Diagnosis not present

## 2018-10-14 ENCOUNTER — Other Ambulatory Visit: Payer: Self-pay | Admitting: Internal Medicine

## 2018-10-16 ENCOUNTER — Telehealth: Payer: Self-pay

## 2018-10-16 NOTE — Telephone Encounter (Signed)
Copied from [place] [id number]. Topic: General - Other >> Oct 13, 2018  2:39 PM Rainey Pines A wrote: Patient would like to speak with Dr. Idelle Leech in regards to medication for bronchitis

## 2018-10-16 NOTE — Telephone Encounter (Signed)
LVM with pt to have a virtual visit for medication.

## 2018-10-17 DIAGNOSIS — H5310 Unspecified subjective visual disturbances: Secondary | ICD-10-CM | POA: Diagnosis not present

## 2018-10-17 NOTE — Progress Notes (Signed)
Subjective:    Patient ID: Kristina Ayala, female    DOB: 1945/11/06, 73 y.o.   MRN: [id number]  HPI The patient is here for an acute visit.  Cold symptoms:  Last Thursday or Friday her cold symptoms started. She has nasal congestion, runny nose, sore throat, dry cough on occasion and sinus headaches.  She took her homeopathic remedies.   She has been taking mucinex, claritin.  She has been extremely careful to avoid any possible exposure to COVID.  She wears her mask and takes all precautions.  Seeing floaters:  Her eye doctor said there was nothing wrong with her eyes.  He told her it was something up in her brain.    Change in bowels:  Bm is regular but is taking longer to come out.  The stool soft. It seems like it does not stop and she just has to keep wiping.  She did start a new supplement for hair/skin.  She is also taking vitamin d.  She has been taking cold medications and has been taking a lot of ginger for her cold.  She denies abdominal pain, blood in the stool, diarrhea and constipation.    Medications and allergies reviewed with patient and updated if appropriate.  Patient Active Problem List   Diagnosis Date Noted  . Change in stool 10/18/2018  . Lung nodules 06/19/2018  . Abnormal findings on diagnostic imaging of lung 06/08/2018  . Thyromegaly 10/28/2017  . Pain in both lower extremities 10/12/2017  . Greater trochanteric bursitis of left hip 07/26/2017  . Hypokalemia 04/18/2017  . Degenerative arthritis of left knee 12/01/2016  . Pilonidal cyst 12/01/2016  . Anxiety 12/01/2016  . Prediabetes 09/16/2016  . Bursitis of right shoulder 09/04/2015  . Lateral meniscus derangement 06/03/2015  . Tear of LCL (lateral collateral ligament) of knee 06/03/2015  . Lumbar radiculopathy 09/19/2014  . Low back pain 01/04/2014  . Bilateral knee pain 01/04/2014  . Diverticulosis of colon without hemorrhage 07/26/2013  . Trigger thumb of left hand 03/06/2013  . Essential  hypertension, benign 02/15/2013  . Hyperlipidemia 09/28/2012  . COPD (chronic obstructive pulmonary disease) ([place]) 05/22/2012  . Frequent falls 05/22/2012  . Osteoporosis 05/22/2012  . Atypical chest pain 11/12/2011    Current Outpatient Medications on File Prior to Visit  Medication Sig Dispense Refill  . acyclovir (ZOVIRAX) 400 MG tablet Take 1 tablet (400 mg total) by mouth 3 (three) times daily. 21 tablet 0  . aspirin EC 81 MG tablet Take 81 mg by mouth daily.    [name] atorvastatin (LIPITOR) 40 MG tablet TAKE 1 TABLET BY MOUTH EVERY MORNING. NEED OFFICE VISIT 90 tablet 0  . Cholecalciferol (VITAMIN D3) 10000 units TABS Take 1 tablet by mouth.    . Cholecalciferol (VITAMIN D3) 50 MCG (2000 UT) capsule Take 2 capsules (4,000 Units total) by mouth daily. 180 capsule 0  . Cyanocobalamin (VITAMIN B-12) 2500 MCG SUBL Place 1 tablet under the tongue daily.    [name] denosumab (PROLIA) 60 MG/ML SOSY injection INJECT 60 MG INTO THE SKIN ONCE FOR 1 DOSE 1 mL 0  . ferrous sulfate 325 (65 FE) MG tablet Take 1 tablet (325 mg total) by mouth daily with breakfast. 30 tablet 3  . gabapentin (NEURONTIN) 300 MG capsule Take 1 capsule (300 mg total) by mouth at bedtime. 30 capsule 3  . hydrochlorothiazide (HYDRODIURIL) 25 MG tablet TAKE 1 TABLET(25 MG) BY MOUTH EVERY MORNING 90 tablet 3  . lisinopril (ZESTRIL) 40 MG  tablet TAKE 1 TABLET(40 MG) BY MOUTH DAILY 90 tablet 1  . Magnesium 100 MG CAPS Take 100 mg by mouth daily.    . meloxicam (MOBIC) 7.5 MG tablet Take 1 tablet (7.5 mg total) by mouth daily. 30 tablet 0  . potassium chloride (KLOR-CON) 8 MEQ tablet Take 2 tablets (16 MEQ) by mouth daily. 180 tablet 1  . tiZANidine (ZANAFLEX) 4 MG tablet   2  . TURMERIC PO Take 1 tablet by mouth daily.     No current facility-administered medications on file prior to visit.     Past Medical History:  Diagnosis Date  . Arthritis   . Bursitis    left elbow  . Clostridium difficile infection   . Frequent falls    . HLD (hyperlipidemia)   . Hypertension   . Infectious colitis     Past Surgical History:  Procedure Laterality Date  . COLONOSCOPY    . ORIF PROXIMAL TIBIAL PLATEAU FRACTURE Left 02/19/2012   knee  . POLYPECTOMY    . TONSILLECTOMY      Social History   Socioeconomic History  . Marital status: Divorced    Spouse name: Not on file  . Number of children: 2  . Years of education: Not on file  . Highest education level: Not on file  Occupational History  . Occupation: RETIRED  Social Needs  . Financial resource strain: Not hard at all  . Food insecurity    Worry: Never true    Inability: Never true  . Transportation needs    Medical: No    Non-medical: No  Tobacco Use  . Smoking status: Former Smoker    Packs/day: 0.25    Years: 50.00    Pack years: 12.50    Types: Cigarettes    Quit date: 08/28/2014    Years since quitting: 4.1  . Smokeless tobacco: Never Used  . Tobacco comment: 1 pack every 3 days  Substance and Sexual Activity  . Alcohol use: No    Alcohol/week: 0.0 standard drinks  . Drug use: No  . Sexual activity: Not Currently  Lifestyle  . Physical activity    Days per week: 4 days    Minutes per session: 40 min  . Stress: Not at all  Relationships  . Social connections    Talks on phone: More than three times a week    Gets together: More than three times a week    Attends religious service: More than 4 times per year    Active member of club or organization: Yes    Attends meetings of clubs or organizations: More than 4 times per year    Relationship status: Not on file  Other Topics Concern  . Not on file  Social History Narrative   Lives alone has 2 supportive children.       Family History  Problem Relation Age of Onset  . Diverticulosis Mother   . Ovarian cancer Mother   . Pancreatic cancer Father   . Lung cancer Father   . Colon polyps Sister   . Colon cancer Neg Hx     Review of Systems  Constitutional: Negative for chills and  fever.  HENT: Positive for congestion, rhinorrhea, sore throat and voice change. Negative for ear pain, sinus pressure and sinus pain.        No loss of taste or smell  Respiratory: Positive for cough (occ dry cough from PND). Negative for chest tightness, shortness of breath and wheezing.  Gastrointestinal: Negative for abdominal pain, blood in stool, constipation, diarrhea and nausea.  Neurological: Positive for headaches (sinus). Negative for dizziness.       Objective:   Vitals:   10/18/18 1512  BP: (!) 146/88  Pulse: 87  Resp: 16  Temp: 98.8 F (37.1 C)  SpO2: 98%   BP Readings from Last 3 Encounters:  10/18/18 (!) 146/88  09/26/18 (!) 144/82  07/07/18 124/78   Wt Readings from Last 3 Encounters:  10/18/18 169 lb (76.7 kg)  07/07/18 173 lb (78.5 kg)  06/09/18 173 lb (78.5 kg)   Body mass index is 28.12 kg/m.   Physical Exam    GENERAL APPEARANCE: Appears stated age, well appearing, NAD EYES: conjunctiva clear, no icterus HEENT: bilateral tympanic membranes and ear canals normal, oropharynx with no erythema, no thyromegaly, trachea midline, no cervical or supraclavicular lymphadenopathy LUNGS: Clear to auscultation without wheeze or crackles, unlabored breathing, good air entry bilaterally CARDIOVASCULAR: Normal S1,S2 without murmurs, no edema SKIN: Warm, dry      Assessment & Plan:    See Problem List for Assessment and Plan of chronic medical problems.

## 2018-10-18 ENCOUNTER — Ambulatory Visit (INDEPENDENT_AMBULATORY_CARE_PROVIDER_SITE_OTHER): Payer: Medicare Other | Admitting: Internal Medicine

## 2018-10-18 ENCOUNTER — Other Ambulatory Visit: Payer: Self-pay

## 2018-10-18 ENCOUNTER — Ambulatory Visit: Payer: Medicare Other

## 2018-10-18 ENCOUNTER — Encounter: Payer: Self-pay | Admitting: Internal Medicine

## 2018-10-18 VITALS — BP 146/88 | HR 87 | Temp 98.8°F | Resp 16 | Ht 65.0 in | Wt 169.0 lb

## 2018-10-18 DIAGNOSIS — J069 Acute upper respiratory infection, unspecified: Secondary | ICD-10-CM | POA: Insufficient documentation

## 2018-10-18 DIAGNOSIS — R195 Other fecal abnormalities: Secondary | ICD-10-CM | POA: Diagnosis not present

## 2018-10-18 DIAGNOSIS — Z23 Encounter for immunization: Secondary | ICD-10-CM

## 2018-10-18 MED ORDER — ALPRAZOLAM 0.5 MG PO TABS: ORAL_TABLET | ORAL | 0 refills | Status: DC

## 2018-10-18 MED ORDER — AMLODIPINE BESYLATE 5 MG PO TABS: ORAL_TABLET | ORAL | 1 refills | Status: DC

## 2018-10-18 NOTE — Assessment & Plan Note (Signed)
Stools have changed recently-stools are softer, but she denies any diarrhea, constipation, abdominal pain or blood in the stool Change in stools are not overly concerning-possible many causes including increased supplementation, cold medications and viral illness Advised her just monitor for now

## 2018-10-18 NOTE — Patient Instructions (Signed)
Upper Respiratory Infection, Adult An upper respiratory infection (URI) is a common viral infection of the nose, throat, and upper air passages that lead to the lungs. The most common type of URI is the common cold. URIs usually get better on their own, without medical treatment. What are the causes? A URI is caused by a virus. You may catch a virus by:  Breathing in droplets from an infected person's cough or sneeze.  Touching something that has been exposed to the virus (contaminated) and then touching your mouth, nose, or eyes. What increases the risk? You are more likely to get a URI if:  You are very young or very old.  It is autumn or winter.  You have close contact with others, such as at a daycare, school, or health care facility.  You smoke.  You have long-term (chronic) heart or lung disease.  You have a weakened disease-fighting (immune) system.  You have nasal allergies or asthma.  You are experiencing a lot of stress.  You work in an area that has poor air circulation.  You have poor nutrition. What are the signs or symptoms? A URI usually involves some of the following symptoms:  Runny or stuffy (congested) nose.  Sneezing.  Cough.  Sore throat.  Headache.  Fatigue.  Fever.  Loss of appetite.  Pain in your forehead, behind your eyes, and over your cheekbones (sinus pain).  Muscle aches.  Redness or irritation of the eyes.  Pressure in the ears or face. How is this diagnosed? This condition may be diagnosed based on your medical history and symptoms, and a physical exam. Your health care provider may use a cotton swab to take a mucus sample from your nose (nasal swab). This sample can be tested to determine what virus is causing the illness. How is this treated? URIs usually get better on their own within 7-10 days. You can take steps at home to relieve your symptoms. Medicines cannot cure URIs, but your health care provider may recommend  certain medicines to help relieve symptoms, such as:  Over-the-counter cold medicines.  Cough suppressants. Coughing is a type of defense against infection that helps to clear the respiratory system, so take these medicines only as recommended by your health care provider.  Fever-reducing medicines. Follow these instructions at home: Activity  Rest as needed.  If you have a fever, stay home from work or school until your fever is gone or until your health care provider says you are no longer contagious. Your health care provider may have you wear a face mask to prevent your infection from spreading. Relieving symptoms  Gargle with a salt-water mixture 3-4 times a day or as needed. To make a salt-water mixture, completely dissolve -1 tsp of salt in 1 cup of warm water.  Use a cool-mist humidifier to add moisture to the air. This can help you breathe more easily. Eating and drinking   Drink enough fluid to keep your urine pale yellow.  Eat soups and other clear broths. General instructions   Take over-the-counter and prescription medicines only as told by your health care provider. These include cold medicines, fever reducers, and cough suppressants.  Do not use any products that contain nicotine or tobacco, such as cigarettes and e-cigarettes. If you need help quitting, ask your health care provider.  Stay away from secondhand smoke.  Stay up to date on all immunizations, including the yearly (annual) flu vaccine.  Keep all follow-up visits as told by your health   care provider. This is important. How to prevent the spread of infection to others   URIs can be passed from person to person (are contagious). To prevent the infection from spreading: ? Wash your hands often with soap and water. If soap and water are not available, use hand sanitizer. ? Avoid touching your mouth, face, eyes, or nose. ? Cough or sneeze into a tissue or your sleeve or elbow instead of into your hand  or into the air. Contact a health care provider if:  You are getting worse instead of better.  You have a fever or chills.  Your mucus is brown or red.  You have yellow or brown discharge coming from your nose.  You have pain in your face, especially when you bend forward.  You have swollen neck glands.  You have pain while swallowing.  You have white areas in the back of your throat. Get help right away if:  You have shortness of breath that gets worse.  You have severe or persistent: ? Headache. ? Ear pain. ? Sinus pain. ? Chest pain.  You have chronic lung disease along with any of the following: ? Wheezing. ? Prolonged cough. ? Coughing up blood. ? A change in your usual mucus.  You have a stiff neck.  You have changes in your: ? Vision. ? Hearing. ? Thinking. ? Mood. Summary  An upper respiratory infection (URI) is a common infection of the nose, throat, and upper air passages that lead to the lungs.  A URI is caused by a virus.  URIs usually get better on their own within 7-10 days.  Medicines cannot cure URIs, but your health care provider may recommend certain medicines to help relieve symptoms. This information is not intended to replace advice given to you by your health care provider. Make sure you discuss any questions you have with your health care provider. Document Released: 07/07/2000 Document Revised: 01/19/2018 Document Reviewed: 08/27/2016 Elsevier Patient Education  2020 [employer].

## 2018-10-20 ENCOUNTER — Other Ambulatory Visit: Payer: Self-pay

## 2018-10-20 ENCOUNTER — Ambulatory Visit (INDEPENDENT_AMBULATORY_CARE_PROVIDER_SITE_OTHER)
Admission: RE | Admit: 2018-10-20 | Discharge: 2018-10-20 | Disposition: A | Discharge status: Home / Self Care | Payer: Medicare Other | Source: Ambulatory Visit | Attending: Internal Medicine | Admitting: Internal Medicine

## 2018-10-20 DIAGNOSIS — R918 Other nonspecific abnormal finding of lung field: Secondary | ICD-10-CM | POA: Diagnosis not present

## 2018-10-20 DIAGNOSIS — R911 Solitary pulmonary nodule: Secondary | ICD-10-CM | POA: Diagnosis not present

## 2018-10-23 ENCOUNTER — Other Ambulatory Visit: Payer: Self-pay | Admitting: Internal Medicine

## 2018-11-01 ENCOUNTER — Telehealth: Payer: Self-pay | Admitting: Internal Medicine

## 2018-11-01 MED ORDER — MELOXICAM 7.5 MG PO TABS: 7.5000 mg | ORAL_TABLET | Freq: Every day | ORAL | 1 refills | Status: DC

## 2018-11-01 NOTE — Telephone Encounter (Signed)
Pt request refill  meloxicam (MOBIC) 7.5 MG tablet  [facility] [id number], [address] AT [place] [phone] (Phone) [phone] (Fax)   Pt states her whole body is aching from her osteoporosis, and this med has helped in the past.

## 2018-11-01 NOTE — Telephone Encounter (Signed)
LVM letting pt know rx was sent

## 2018-11-01 NOTE — Telephone Encounter (Signed)
Yes, sent

## 2018-11-01 NOTE — Telephone Encounter (Signed)
Are you ok with filling?

## 2018-11-09 ENCOUNTER — Other Ambulatory Visit: Payer: Self-pay | Admitting: Internal Medicine

## 2018-11-09 ENCOUNTER — Other Ambulatory Visit: Payer: Medicare Other

## 2018-11-09 ENCOUNTER — Ambulatory Visit
Admission: RE | Admit: 2018-11-09 | Discharge: 2018-11-09 | Disposition: A | Discharge status: Home / Self Care | Payer: Medicare Other | Source: Ambulatory Visit | Attending: Internal Medicine | Admitting: Internal Medicine

## 2018-11-09 ENCOUNTER — Telehealth: Payer: Self-pay | Admitting: *Deleted

## 2018-11-09 ENCOUNTER — Encounter: Payer: Self-pay | Admitting: Internal Medicine

## 2018-11-09 ENCOUNTER — Other Ambulatory Visit (INDEPENDENT_AMBULATORY_CARE_PROVIDER_SITE_OTHER): Payer: Medicare Other

## 2018-11-09 ENCOUNTER — Other Ambulatory Visit: Payer: Self-pay

## 2018-11-09 ENCOUNTER — Ambulatory Visit (INDEPENDENT_AMBULATORY_CARE_PROVIDER_SITE_OTHER): Payer: Medicare Other | Admitting: Internal Medicine

## 2018-11-09 VITALS — BP 142/82 | HR 100 | Temp 98.1°F | Ht 65.0 in | Wt 170.0 lb

## 2018-11-09 DIAGNOSIS — R1031 Right lower quadrant pain: Secondary | ICD-10-CM | POA: Diagnosis not present

## 2018-11-09 DIAGNOSIS — N9489 Other specified conditions associated with female genital organs and menstrual cycle: Secondary | ICD-10-CM

## 2018-11-09 DIAGNOSIS — K5730 Diverticulosis of large intestine without perforation or abscess without bleeding: Secondary | ICD-10-CM | POA: Diagnosis not present

## 2018-11-09 DIAGNOSIS — K5641 Fecal impaction: Secondary | ICD-10-CM | POA: Diagnosis not present

## 2018-11-09 LAB — CBC WITH DIFFERENTIAL/PLATELET
Basophils Absolute: 0.1 10*3/uL (ref 0.0–0.1)
Basophils Relative: 1.2 % (ref 0.0–3.0)
Eosinophils Absolute: 0.1 10*3/uL (ref 0.0–0.7)
Eosinophils Relative: 1.8 % (ref 0.0–5.0)
HCT: 39 % (ref 36.0–46.0)
Hemoglobin: 12.7 g/dL (ref 12.0–15.0)
Lymphocytes Relative: 37.6 % (ref 12.0–46.0)
Lymphs Abs: 2.7 10*3/uL (ref 0.7–4.0)
MCHC: 32.5 g/dL (ref 30.0–36.0)
MCV: 83.2 fl (ref 78.0–100.0)
Monocytes Absolute: 0.8 10*3/uL (ref 0.1–1.0)
Monocytes Relative: 10.4 % (ref 3.0–12.0)
Neutro Abs: 3.6 10*3/uL (ref 1.4–7.7)
Neutrophils Relative %: 49 % (ref 43.0–77.0)
Platelets: 309 10*3/uL (ref 150.0–400.0)
RBC: 4.68 Mil/uL (ref 3.87–5.11)
RDW: 15.4 % (ref 11.5–15.5)
WBC: 7.2 10*3/uL (ref 4.0–10.5)

## 2018-11-09 LAB — COMPREHENSIVE METABOLIC PANEL
ALT: 61 U/L — ABNORMAL HIGH (ref 0–35)
AST: 30 U/L (ref 0–37)
Albumin: 4.4 g/dL (ref 3.5–5.2)
Alkaline Phosphatase: 44 U/L (ref 39–117)
BUN: 13 mg/dL (ref 6–23)
CO2: 31 mEq/L (ref 19–32)
Calcium: 9.5 mg/dL (ref 8.4–10.5)
Chloride: 104 mEq/L (ref 96–112)
Creatinine, Ser: 0.75 mg/dL (ref 0.40–1.20)
GFR: 91.24 mL/min (ref >60.00)
Glucose, Bld: 77 mg/dL (ref 70–99)
Potassium: 3.5 mEq/L (ref 3.5–5.1)
Sodium: 142 mEq/L (ref 135–145)
Total Bilirubin: 0.5 mg/dL (ref 0.2–1.2)
Total Protein: 7.2 g/dL (ref 6.0–8.3)

## 2018-11-09 LAB — URINALYSIS, ROUTINE W REFLEX MICROSCOPIC
Bilirubin Urine: NEGATIVE
Hgb urine dipstick: NEGATIVE
Ketones, ur: NEGATIVE
Leukocytes,Ua: NEGATIVE
Nitrite: NEGATIVE
RBC / HPF: NONE SEEN (ref 0–?)
Specific Gravity, Urine: 1.02 (ref 1.000–1.030)
Total Protein, Urine: NEGATIVE
Urine Glucose: NEGATIVE
Urobilinogen, UA: 0.2 (ref 0.0–1.0)
pH: 5.5 (ref 5.0–8.0)

## 2018-11-09 LAB — SEDIMENTATION RATE: Sed Rate: 38 mm/hr — ABNORMAL HIGH (ref 0–30)

## 2018-11-09 MED ORDER — IOPAMIDOL (ISOVUE-300) INJECTION 61%
100.0000 mL | Freq: Once | INTRAVENOUS | Status: AC | PRN
  Administered 2018-11-09: 100 mL via INTRAVENOUS

## 2018-11-09 MED ORDER — MELOXICAM 7.5 MG PO TABS: 7.5000 mg | ORAL_TABLET | Freq: Every day | ORAL | 5 refills | Status: DC

## 2018-11-09 NOTE — Patient Instructions (Signed)
Have blood work done today.    A Ct scan was ordered.      Medications reviewed and updated.  Changes include :   none

## 2018-11-09 NOTE — Telephone Encounter (Signed)
Results given to patient today. 

## 2018-11-09 NOTE — Telephone Encounter (Signed)
Stat Ct results: in Epic for provider review. Office notified.

## 2018-11-09 NOTE — Assessment & Plan Note (Signed)
RLQ started yesterday - colicky, severe at times Nausea, no other symptoms She is slightly tender on exam without rebound or guarding Does not seem to be musculoskeletal in nature since it is not worse with certain movements or activities Given the position need to worry about appendicitis No changes in bowels or concerning urinary symptoms, UTI or kidney stone is possible, GYN issue seems less likely but in the differential CBC, CMP, ESR, UA CT of the abdomen pelvis

## 2018-11-09 NOTE — Progress Notes (Signed)
Subjective:    Patient ID: Kristina Ayala, female    DOB: [birth year], 73 y.o.   MRN: [id number]  HPI The patient is here for an acute visit.   Lower right abdominal pain:  Her pain started yesterday.  She denies doing anything that may have caused it.  It is not worse with movement.  The pain is intermittent.  The pain can be severe when it comes.  It is a cramping type pain.  Last night the pain was severe and made her nauseous.  She denies vomiting, changes in bowels, diarrhea or constipation.  She urinates a lot, but drinks a lot.  She denies dysuria or hematuria.  She has not had any fever or chills.  Her appetite is lower than usual.   She tried taking gabapentin, but she is unsure if it helped.  Medications and allergies reviewed with patient and updated if appropriate.  Patient Active Problem List   Diagnosis Date Noted  . Change in stool 10/18/2018  . URI (upper respiratory infection) 10/18/2018  . Lung nodules 06/19/2018  . Abnormal findings on diagnostic imaging of lung 06/08/2018  . Thyromegaly 10/28/2017  . Pain in both lower extremities 10/12/2017  . Greater trochanteric bursitis of left hip 07/26/2017  . Hypokalemia 04/18/2017  . Degenerative arthritis of left knee 12/01/2016  . Pilonidal cyst 12/01/2016  . Anxiety 12/01/2016  . Prediabetes 09/16/2016  . Bursitis of right shoulder 09/04/2015  . Lateral meniscus derangement 06/03/2015  . Tear of LCL (lateral collateral ligament) of knee 06/03/2015  . Lumbar radiculopathy 09/19/2014  . Low back pain 01/04/2014  . Bilateral knee pain 01/04/2014  . Diverticulosis of colon without hemorrhage 07/26/2013  . Trigger thumb of left hand 03/06/2013  . Essential hypertension, benign 02/15/2013  . Hyperlipidemia 09/28/2012  . COPD (chronic obstructive pulmonary disease) ([place]) 05/22/2012  . Frequent falls 05/22/2012  . Osteoporosis 05/22/2012  . Atypical chest pain 11/12/2011    Current Outpatient Medications on  File Prior to Visit  Medication Sig Dispense Refill  . acyclovir (ZOVIRAX) 400 MG tablet Take 1 tablet (400 mg total) by mouth 3 (three) times daily. 21 tablet 0  . ALPRAZolam (XANAX) 0.5 MG tablet TAKE 1 TABLET(0.5 MG) BY MOUTH AT BEDTIME AS NEEDED FOR ANXIETY 30 tablet 0  . amLODipine (NORVASC) 5 MG tablet TAKE 1 TABLET(5 MG) BY MOUTH DAILY 90 tablet 1  . aspirin EC 81 MG tablet Take 81 mg by mouth daily.    [name] atorvastatin (LIPITOR) 40 MG tablet TAKE 1 TABLET BY MOUTH EVERY MORNING. NEED OFFICE VISIT 90 tablet 0  . Cholecalciferol (VITAMIN D3) 10000 units TABS Take 1 tablet by mouth.    . Cholecalciferol (VITAMIN D3) 50 MCG (2000 UT) capsule Take 2 capsules (4,000 Units total) by mouth daily. 180 capsule 0  . Cyanocobalamin (VITAMIN B-12) 2500 MCG SUBL Place 1 tablet under the tongue daily.    [name] denosumab (PROLIA) 60 MG/ML SOSY injection INJECT 60 MG INTO THE SKIN ONCE FOR 1 DOSE 1 mL 0  . ferrous sulfate 325 (65 FE) MG tablet Take 1 tablet (325 mg total) by mouth daily with breakfast. 30 tablet 3  . gabapentin (NEURONTIN) 300 MG capsule Take 1 capsule (300 mg total) by mouth at bedtime. 30 capsule 3  . hydrochlorothiazide (HYDRODIURIL) 25 MG tablet TAKE 1 TABLET(25 MG) BY MOUTH EVERY MORNING 90 tablet 3  . lisinopril (ZESTRIL) 40 MG tablet TAKE 1 TABLET(40 MG) BY MOUTH DAILY 90 tablet 1  .  Magnesium 100 MG CAPS Take 100 mg by mouth daily.    . meloxicam (MOBIC) 7.5 MG tablet Take 1 tablet (7.5 mg total) by mouth daily. 30 tablet 1  . potassium chloride (KLOR-CON) 8 MEQ tablet Take 2 tablets (16 MEQ) by mouth daily. 180 tablet 1  . tiZANidine (ZANAFLEX) 4 MG tablet   2  . TURMERIC PO Take 1 tablet by mouth daily.     No current facility-administered medications on file prior to visit.     Past Medical History:  Diagnosis Date  . Arthritis   . Bursitis    left elbow  . Clostridium difficile infection   . Frequent falls   . HLD (hyperlipidemia)   . Hypertension   . Infectious colitis      Past Surgical History:  Procedure Laterality Date  . COLONOSCOPY    . ORIF PROXIMAL TIBIAL PLATEAU FRACTURE Left 02/19/2012   knee  . POLYPECTOMY    . TONSILLECTOMY      Social History   Socioeconomic History  . Marital status: Divorced    Spouse name: Not on file  . Number of children: 2  . Years of education: Not on file  . Highest education level: Not on file  Occupational History  . Occupation: RETIRED  Social Needs  . Financial resource strain: Not hard at all  . Food insecurity    Worry: Never true    Inability: Never true  . Transportation needs    Medical: No    Non-medical: No  Tobacco Use  . Smoking status: Former Smoker    Packs/day: 0.25    Years: 50.00    Pack years: 12.50    Types: Cigarettes    Quit date: 08/28/2014    Years since quitting: 4.2  . Smokeless tobacco: Never Used  . Tobacco comment: 1 pack every 3 days  Substance and Sexual Activity  . Alcohol use: No    Alcohol/week: 0.0 standard drinks  . Drug use: No  . Sexual activity: Not Currently  Lifestyle  . Physical activity    Days per week: 4 days    Minutes per session: 40 min  . Stress: Not at all  Relationships  . Social connections    Talks on phone: More than three times a week    Gets together: More than three times a week    Attends religious service: More than 4 times per year    Active member of club or organization: Yes    Attends meetings of clubs or organizations: More than 4 times per year    Relationship status: Not on file  Other Topics Concern  . Not on file  Social History Narrative   Lives alone has 2 supportive children.       Family History  Problem Relation Age of Onset  . Diverticulosis Mother   . Ovarian cancer Mother   . Pancreatic cancer Father   . Lung cancer Father   . Colon polyps Sister   . Colon cancer Neg Hx     Review of Systems  Constitutional: Negative for appetite change, chills and fever.  Gastrointestinal: Positive for abdominal  pain and nausea (last night with the pain). Negative for blood in stool (no melena), constipation, diarrhea and vomiting.       Rare gerd  Genitourinary: Positive for frequency (but drinks a lot ). Negative for difficulty urinating, dysuria, hematuria and urgency.       Objective:   Vitals:   11/09/18 1113  BP: (!) 142/82  Pulse: 100  Temp: 98.1 F (36.7 C)  SpO2: 97%   BP Readings from Last 3 Encounters:  11/09/18 (!) 142/82  10/18/18 (!) 146/88  09/26/18 (!) 144/82   Wt Readings from Last 3 Encounters:  11/09/18 170 lb (77.1 kg)  10/18/18 169 lb (76.7 kg)  07/07/18 173 lb (78.5 kg)   Body mass index is 28.29 kg/m.   Physical Exam Constitutional:      General: She is not in acute distress.    Appearance: She is well-developed. She is not ill-appearing.  HENT:     Head: Normocephalic and atraumatic.  Abdominal:     General: There is no distension.     Palpations: Abdomen is soft. There is no mass.     Tenderness: There is abdominal tenderness in the right lower quadrant. There is no right CVA tenderness, left CVA tenderness, guarding or rebound.  Skin:    General: Skin is warm and dry.  Neurological:     Mental Status: She is alert.            Assessment & Plan:    See Problem List for Assessment and Plan of chronic medical problems.

## 2018-11-24 ENCOUNTER — Ambulatory Visit
Admission: RE | Admit: 2018-11-24 | Discharge: 2018-11-24 | Disposition: A | Discharge status: Home / Self Care | Payer: Medicare Other | Source: Ambulatory Visit | Attending: Internal Medicine | Admitting: Internal Medicine

## 2018-11-24 DIAGNOSIS — N9489 Other specified conditions associated with female genital organs and menstrual cycle: Secondary | ICD-10-CM

## 2018-11-28 DIAGNOSIS — Z961 Presence of intraocular lens: Secondary | ICD-10-CM | POA: Diagnosis not present

## 2018-11-29 ENCOUNTER — Telehealth: Payer: Self-pay

## 2018-11-29 ENCOUNTER — Other Ambulatory Visit: Payer: Self-pay | Admitting: *Deleted

## 2018-11-29 DIAGNOSIS — M5416 Radiculopathy, lumbar region: Secondary | ICD-10-CM

## 2018-11-29 NOTE — Telephone Encounter (Signed)
Pt states she is constipated and has not had a good BM in 2 weeks. Anything recommended OTC or as a prescription? Has called her obgyn about her ovarian cyst. Will see them for that issue.

## 2018-11-29 NOTE — Telephone Encounter (Signed)
Answered under result note.

## 2018-11-29 NOTE — Telephone Encounter (Signed)
Spoke with pt. She has an appointment set up with [facility] for the ovarian cyst. Report faxed over.

## 2018-11-29 NOTE — Telephone Encounter (Signed)
Copied from [place] [id number]. Topic: General - Other >> Nov 29, 2018  9:51 AM Rainey Pines A wrote: Patient would like a callback from [facility] in regards to getting ultrasound results sent to her obgyn whom is waiting on her response . Please advise

## 2018-12-04 ENCOUNTER — Other Ambulatory Visit: Payer: Medicare Other

## 2018-12-05 ENCOUNTER — Other Ambulatory Visit: Payer: Self-pay

## 2018-12-05 ENCOUNTER — Ambulatory Visit
Admission: RE | Admit: 2018-12-05 | Discharge: 2018-12-05 | Disposition: A | Discharge status: Home / Self Care | Payer: Medicare Other | Source: Ambulatory Visit | Attending: Family Medicine | Admitting: Family Medicine

## 2018-12-05 DIAGNOSIS — M5416 Radiculopathy, lumbar region: Secondary | ICD-10-CM | POA: Diagnosis not present

## 2018-12-05 MED ORDER — IOPAMIDOL (ISOVUE-M 200) INJECTION 41%
1.0000 mL | Freq: Once | INTRAMUSCULAR | Status: AC
  Administered 2018-12-05: 1 mL via EPIDURAL

## 2018-12-05 MED ORDER — METHYLPREDNISOLONE ACETATE 40 MG/ML INJ SUSP (RADIOLOG
120.0000 mg | Freq: Once | INTRAMUSCULAR | Status: AC
  Administered 2018-12-05: 120 mg via EPIDURAL

## 2018-12-05 NOTE — Discharge Instructions (Signed)

## 2018-12-12 ENCOUNTER — Other Ambulatory Visit: Payer: Self-pay

## 2018-12-12 NOTE — Patient Outreach (Signed)
[facility]) Care Management  12/12/2018  Kristina Ayala 02-Sep-1945 [id number]   Medication Adherence call to Kristina Ayala Compliant Voice message left with a call back number. Kristina Ayala is showing past due on Lisinopril 40 mg under [place].   [place] Management Direct Dial [phone]  Fax [phone] [name]

## 2018-12-19 ENCOUNTER — Ambulatory Visit: Payer: Medicare Other | Admitting: Family Medicine

## 2018-12-26 ENCOUNTER — Ambulatory Visit: Payer: Medicare Other | Admitting: Family Medicine

## 2019-01-09 ENCOUNTER — Ambulatory Visit: Payer: Medicare Other

## 2019-01-09 ENCOUNTER — Ambulatory Visit: Payer: Medicare Other | Admitting: Internal Medicine

## 2019-01-09 NOTE — Patient Instructions (Addendum)
  Tests ordered today. Your results will be released to MyChart (or called to you) after review.  If any changes need to be made, you will be notified at that same time.    Medications reviewed and updated.  Changes include :   none     Please followup in 6 months   

## 2019-01-09 NOTE — Progress Notes (Signed)
Subjective:    Patient ID: Kristina Ayala, female    DOB: 28-Jan-1945, 73 y.o.   MRN: [id number]  HPI The patient is here for follow up.  She is exercising regularly - floor bike, but only doing it a couple times a week.  She is very active throughout the day.    Hypertension: She is taking her medication daily. She is compliant with a low sodium diet.  She denies chest pain, palpitations, edema, shortness of breath and regular headaches.     Hyperlipidemia: She is taking her medication daily. She is compliant with a low fat/cholesterol diet. She denies myalgias.   Prediabetes:  She is fairly compliant with a low sugar/carbohydrate diet.  She is exercising.  Anxiety: She is taking her medication daily as prescribed. She takes the xanax about once a month.  She denies any side effects from the medication.  She does experience some chest tightness at times, which she feels is related to anxiety.  She feels her anxiety is fairly controlled and she is happy with her current dose of medication.   Osteoporosis:  She is taking calcium and vitamin d daily.  She is exercising regularly.  She is getting prolia and is due now.    Osteoarthritis: She is currently taking Mobic daily and it does help.    Muscle cramping in legs at night: This occurs intermittently.  She has been taking tizanidine and sometimes gabapentin and both of these help.  She takes them as needed.  She is taking iron and vitamin c.     Medications and allergies reviewed with patient and updated if appropriate.  Patient Active Problem List   Diagnosis Date Noted  . Leg cramping 01/10/2019  . Osteoarthritis 01/10/2019  . Right lower quadrant abdominal pain 11/09/2018  . Adnexal mass, right 11/09/2018  . Change in stool 10/18/2018  . Lung nodules 06/19/2018  . Abnormal findings on diagnostic imaging of lung 06/08/2018  . Thyromegaly 10/28/2017  . Pain in both lower extremities 10/12/2017  . Greater trochanteric  bursitis of left hip 07/26/2017  . Hypokalemia 04/18/2017  . Degenerative arthritis of left knee 12/01/2016  . Pilonidal cyst 12/01/2016  . Anxiety 12/01/2016  . Prediabetes 09/16/2016  . Bursitis of right shoulder 09/04/2015  . Lateral meniscus derangement 06/03/2015  . Tear of LCL (lateral collateral ligament) of knee 06/03/2015  . Lumbar radiculopathy 09/19/2014  . Low back pain 01/04/2014  . Bilateral knee pain 01/04/2014  . Diverticulosis of colon without hemorrhage 07/26/2013  . Trigger thumb of left hand 03/06/2013  . Essential hypertension, benign 02/15/2013  . Hyperlipidemia 09/28/2012  . COPD (chronic obstructive pulmonary disease) ([place]) 05/22/2012  . Frequent falls 05/22/2012  . Osteoporosis 05/22/2012  . Atypical chest pain 11/12/2011    Current Outpatient Medications on File Prior to Visit  Medication Sig Dispense Refill  . ALPRAZolam (XANAX) 0.5 MG tablet TAKE 1 TABLET(0.5 MG) BY MOUTH AT BEDTIME AS NEEDED FOR ANXIETY 30 tablet 0  . amLODipine (NORVASC) 5 MG tablet TAKE 1 TABLET(5 MG) BY MOUTH DAILY 90 tablet 1  . aspirin EC 81 MG tablet Take 81 mg by mouth daily.    [name] atorvastatin (LIPITOR) 40 MG tablet TAKE 1 TABLET BY MOUTH EVERY MORNING. NEED OFFICE VISIT 90 tablet 0  . Cholecalciferol (VITAMIN D3) 10000 units TABS Take 2 tablets by mouth.     . Cyanocobalamin (VITAMIN B-12) 2500 MCG SUBL Place 1 tablet under the tongue daily.    . ferrous  sulfate 325 (65 FE) MG tablet Take 1 tablet (325 mg total) by mouth daily with breakfast. 30 tablet 3  . gabapentin (NEURONTIN) 300 MG capsule Take 1 capsule (300 mg total) by mouth at bedtime. 30 capsule 3  . hydrochlorothiazide (HYDRODIURIL) 25 MG tablet TAKE 1 TABLET(25 MG) BY MOUTH EVERY MORNING 90 tablet 3  . lisinopril (ZESTRIL) 40 MG tablet TAKE 1 TABLET(40 MG) BY MOUTH DAILY 90 tablet 1  . Magnesium 100 MG CAPS Take 100 mg by mouth daily.    . potassium chloride (KLOR-CON) 8 MEQ tablet Take 2 tablets (16 MEQ) by mouth  daily. 180 tablet 1  . TURMERIC PO Take 1 tablet by mouth daily.     No current facility-administered medications on file prior to visit.    Past Medical History:  Diagnosis Date  . Arthritis   . Bursitis    left elbow  . Clostridium difficile infection   . Frequent falls   . HLD (hyperlipidemia)   . Hypertension   . Infectious colitis     Past Surgical History:  Procedure Laterality Date  . CATARACT EXTRACTION Bilateral   . COLONOSCOPY    . ORIF PROXIMAL TIBIAL PLATEAU FRACTURE Left 02/19/2012   knee  . POLYPECTOMY    . TONSILLECTOMY      Social History   Socioeconomic History  . Marital status: Divorced    Spouse name: Not on file  . Number of children: 2  . Years of education: Not on file  . Highest education level: Not on file  Occupational History  . Occupation: RETIRED  Tobacco Use  . Smoking status: Former Smoker    Packs/day: 0.25    Years: 50.00    Pack years: 12.50    Types: Cigarettes    Quit date: 08/28/2014    Years since quitting: 4.3  . Smokeless tobacco: Never Used  . Tobacco comment: 1 pack every 3 days  Substance and Sexual Activity  . Alcohol use: No    Alcohol/week: 0.0 standard drinks  . Drug use: No  . Sexual activity: Not Currently  Other Topics Concern  . Not on file  Social History Narrative   Lives alone has 2 supportive children.      Social Determinants of Health   Financial Resource Strain:   . Difficulty of Paying Living Expenses: Not on file  Food Insecurity:   . Worried About Charity fundraiser in the Last Year: Not on file  . Ran Out of Food in the Last Year: Not on file  Transportation Needs:   . Lack of Transportation (Medical): Not on file  . Lack of Transportation (Non-Medical): Not on file  Physical Activity:   . Days of Exercise per Week: Not on file  . Minutes of Exercise per Session: Not on file  Stress:   . Feeling of Stress : Not on file  Social Connections:   . Frequency of Communication with Friends  and Family: Not on file  . Frequency of Social Gatherings with Friends and Family: Not on file  . Attends Religious Services: Not on file  . Active Member of Clubs or Organizations: Not on file  . Attends Archivist Meetings: Not on file  . Marital Status: Not on file    Family History  Problem Relation Age of Onset  . Diverticulosis Mother   . Ovarian cancer Mother   . Pancreatic cancer Father   . Lung cancer Father   . Colon polyps Sister   .  Colon cancer Neg Hx     Review of Systems  Constitutional: Negative for chills and fever.  Respiratory: Negative for cough, shortness of breath and wheezing.   Cardiovascular: Positive for chest pain (occ chest tightness - related to stress). Negative for palpitations and leg swelling.  Neurological: Negative for dizziness, light-headedness and headaches.       Objective:   Vitals:   01/10/19 1342  BP: 138/84  Pulse: 85  Resp: 16  Temp: 98.7 F (37.1 C)  SpO2: 98%   BP Readings from Last 3 Encounters:  01/10/19 138/84  01/10/19 138/84  12/05/18 (!) 165/95   Wt Readings from Last 3 Encounters:  01/10/19 170 lb (77.1 kg)  01/10/19 170 lb (77.1 kg)  11/09/18 170 lb (77.1 kg)   Body mass index is 28.29 kg/m.   Physical Exam    Constitutional: Appears well-developed and well-nourished. No distress.  HENT:  Head: Normocephalic and atraumatic.  Neck: Neck supple. No tracheal deviation present. No thyromegaly present.  No cervical lymphadenopathy Cardiovascular: Normal rate, regular rhythm and normal heart sounds.   No murmur heard. No carotid bruit .  No edema Pulmonary/Chest: Effort normal and breath sounds normal. No respiratory distress. No has no wheezes. No rales.  Skin: Skin is warm and dry. Not diaphoretic.  Psychiatric: Normal mood and affect. Behavior is normal.      Assessment & Plan:    See Problem List for Assessment and Plan of chronic medical problems.   This visit occurred during the  SARS-CoV-2 public health emergency.  Safety protocols were in place, including screening questions prior to the visit, additional usage of staff PPE, and extensive cleaning of exam room while observing appropriate contact time as indicated for disinfecting solutions.

## 2019-01-09 NOTE — Progress Notes (Addendum)
Subjective:   Kristina Ayala is a 73 y.o. female who presents for Medicare Annual (Subsequent) preventive examination.  This visit occurred during the SARS-CoV-2 public health emergency.  Safety protocols were in place, including screening questions prior to the visit, additional usage of staff PPE, and extensive cleaning of exam room while observing appropriate contact time as indicated for disinfecting solutions.   Review of Systems:   Cardiac Risk Factors include: advanced age (>2men, >32 women);dyslipidemia Sleep patterns: feels rested on waking, gets up 1-2 times nightly to void and sleeps 7-8 hours nightly.    Home Safety/Smoke Alarms: Feels safe in home. Smoke alarms in place.  Living environment; residence and Firearm Safety: 2-story house, equipment: Radio producer, Type: [place]. Daughter and grand-daughter lives with patient, no needs for DME, good support system Seat Belt Safety/Bike Helmet: Wears seat belt.     Objective:     Vitals: BP 138/84   Pulse 85   Resp 17   Ht 5\' 5"  (1.651 m)   Wt 170 lb (77.1 kg)   SpO2 98%   BMI 28.29 kg/m   Body mass index is 28.29 kg/m.  Advanced Directives 01/10/2019 06/01/2018 07/14/2017 04/18/2017 03/24/2017 01/06/2017 08/03/2016  Does Patient Have a Medical Advance Directive? No No No No No No No  Does patient want to make changes to medical advance directive? - - - - - - -  Would patient like information on creating a medical advance directive? Yes (ED - Information included in AVS) No - Patient declined Yes (ED - Information included in AVS) No - Patient declined - - No - Patient declined    Tobacco Social History   Tobacco Use  Smoking Status Former Smoker  . Packs/day: 0.25  . Years: 50.00  . Pack years: 12.50  . Types: Cigarettes  . Quit date: 08/28/2014  . Years since quitting: 4.3  Smokeless Tobacco Never Used  Tobacco Comment   1 pack every 3 days     Counseling given: Not Answered Comment: 1 pack every 3  days  Past Medical History:  Diagnosis Date  . Arthritis   . Bursitis    left elbow  . Clostridium difficile infection   . Frequent falls   . HLD (hyperlipidemia)   . Hypertension   . Infectious colitis    Past Surgical History:  Procedure Laterality Date  . CATARACT EXTRACTION Bilateral   . COLONOSCOPY    . ORIF PROXIMAL TIBIAL PLATEAU FRACTURE Left 02/19/2012   knee  . POLYPECTOMY    . TONSILLECTOMY     Family History  Problem Relation Age of Onset  . Diverticulosis Mother   . Ovarian cancer Mother   . Pancreatic cancer Father   . Lung cancer Father   . Colon polyps Sister   . Colon cancer Neg Hx    Social History   Socioeconomic History  . Marital status: Divorced    Spouse name: Not on file  . Number of children: 2  . Years of education: Not on file  . Highest education level: Not on file  Occupational History  . Occupation: RETIRED  Tobacco Use  . Smoking status: Former Smoker    Packs/day: 0.25    Years: 50.00    Pack years: 12.50    Types: Cigarettes    Quit date: 08/28/2014    Years since quitting: 4.3  . Smokeless tobacco: Never Used  . Tobacco comment: 1 pack every 3 days  Substance and Sexual Activity  .  Alcohol use: No    Alcohol/week: 0.0 standard drinks  . Drug use: No  . Sexual activity: Not Currently  Other Topics Concern  . Not on file  Social History Narrative   Lives alone has 2 supportive children.      Social Determinants of Health   Financial Resource Strain:   . Difficulty of Paying Living Expenses: Not on file  Food Insecurity:   . Worried About Charity fundraiser in the Last Year: Not on file  . Ran Out of Food in the Last Year: Not on file  Transportation Needs:   . Lack of Transportation (Medical): Not on file  . Lack of Transportation (Non-Medical): Not on file  Physical Activity:   . Days of Exercise per Week: Not on file  . Minutes of Exercise per Session: Not on file  Stress:   . Feeling of Stress : Not on file   Social Connections:   . Frequency of Communication with Friends and Family: Not on file  . Frequency of Social Gatherings with Friends and Family: Not on file  . Attends Religious Services: Not on file  . Active Member of Clubs or Organizations: Not on file  . Attends Archivist Meetings: Not on file  . Marital Status: Not on file    Outpatient Encounter Medications as of 01/10/2019  Medication Sig  . ALPRAZolam (XANAX) 0.5 MG tablet TAKE 1 TABLET(0.5 MG) BY MOUTH AT BEDTIME AS NEEDED FOR ANXIETY  . amLODipine (NORVASC) 5 MG tablet TAKE 1 TABLET(5 MG) BY MOUTH DAILY  . aspirin EC 81 MG tablet Take 81 mg by mouth daily.  [name] atorvastatin (LIPITOR) 40 MG tablet TAKE 1 TABLET BY MOUTH EVERY MORNING. NEED OFFICE VISIT  . Cholecalciferol (VITAMIN D3) 10000 units TABS Take 2 tablets by mouth.   . Cyanocobalamin (VITAMIN B-12) 2500 MCG SUBL Place 1 tablet under the tongue daily.  [name] denosumab (PROLIA) 60 MG/ML SOSY injection INJECT 60 MG INTO THE SKIN ONCE FOR 1 DOSE  . ferrous sulfate 325 (65 FE) MG tablet Take 1 tablet (325 mg total) by mouth daily with breakfast.  . gabapentin (NEURONTIN) 300 MG capsule Take 1 capsule (300 mg total) by mouth at bedtime.  . hydrochlorothiazide (HYDRODIURIL) 25 MG tablet TAKE 1 TABLET(25 MG) BY MOUTH EVERY MORNING  . lisinopril (ZESTRIL) 40 MG tablet TAKE 1 TABLET(40 MG) BY MOUTH DAILY  . meloxicam (MOBIC) 7.5 MG tablet Take 1 tablet (7.5 mg total) by mouth daily.  . potassium chloride (KLOR-CON) 8 MEQ tablet Take 2 tablets (16 MEQ) by mouth daily.  [name] tiZANidine (ZANAFLEX) 4 MG tablet   . TURMERIC PO Take 1 tablet by mouth daily.  . Magnesium 100 MG CAPS Take 100 mg by mouth daily.  . [DISCONTINUED] acyclovir (ZOVIRAX) 400 MG tablet Take 1 tablet (400 mg total) by mouth 3 (three) times daily. (Patient not taking: Reported on 01/10/2019)  . [DISCONTINUED] Cholecalciferol (VITAMIN D3) 50 MCG (2000 UT) capsule Take 2 capsules (4,000 Units total) by mouth  daily. (Patient not taking: Reported on 01/10/2019)   No facility-administered encounter medications on file as of 01/10/2019.    Activities of Daily Living In your present state of health, do you have any difficulty performing the following activities: 01/10/2019  Hearing? N  Vision? N  Difficulty concentrating or making decisions? N  Walking or climbing stairs? N  Dressing or bathing? N  Doing errands, shopping? N  Preparing Food and eating ? N  Using the Toilet?  N  In the past six months, have you accidently leaked urine? N  Do you have problems with loss of bowel control? Y  Managing your Medications? N  Managing your Finances? N  Housekeeping or managing your Housekeeping? N  Some recent data might be hidden    Patient Care Team: [name], MD as PCP - General (Internal Medicine) Lyndal Pulley, DO as Attending Physician (Family Medicine)    Assessment:   This is a routine wellness examination for Solange. Physical assessment deferred to PCP.  Exercise Activities and Dietary recommendations Current Exercise Habits: Home exercise routine, Type of exercise: walking(stationary bike), Time (Minutes): 30, Frequency (Times/Week): 4, Weekly Exercise (Minutes/Week): 120, Intensity: Mild, Exercise limited by: None identified  Diet (meal preparation, eat out, water intake, caffeinated beverages, dairy products, fruits and vegetables): in general, a "healthy" diet  , well balanced   Reviewed heart healthy diet. Encouraged patient to increase daily water and healthy fluid intake.  Goals      Patient Stated   . <enter goal here> (pt-stated)     Maintain current activity level.       Other   . 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         Fall Risk Fall Risk  01/10/2019 07/07/2018 07/14/2017 05/13/2017 04/05/2017  Falls in the past year? 1 1 No No Yes  Comment - - - - -  Number falls in past yr: 0 0 - - 1  Injury  with Fall? 1 1 - - No  Risk Factor Category  - - - - -  Risk for fall due to : - - - - -  Follow up - - - - -  Comment - - - - -    Depression Screen PHQ 2/9 Scores 01/10/2019 07/07/2018 07/14/2017 05/13/2017  PHQ - 2 Score 0 0 0 0  PHQ- 9 Score - - 3 -     Cognitive Function     6CIT Screen 01/10/2019  What Year? 0 points  What month? 0 points  What time? 0 points  Count back from 20 0 points  Months in reverse 0 points  Repeat phrase 0 points  Total Score 0    Immunization History  Administered Date(s) Administered  . Fluad Quad(high Dose 65+) 10/18/2018  . Influenza, High Dose Seasonal PF 11/10/2016, 10/28/2017  . Influenza,inj,Quad PF,6+ Mos 11/12/2013, 11/27/2014  . Influenza-Unspecified 11/26/2015  . Pneumococcal Conjugate-13 04/30/2015  . Pneumococcal Polysaccharide-23 01/04/2014  . Tdap 07/29/2014   Screening Tests Health Maintenance  Topic Date Due  . DEXA SCAN  07/06/2020  . MAMMOGRAM  07/23/2020  . COLONOSCOPY  06/08/2022  . TETANUS/TDAP  07/28/2024  . INFLUENZA VACCINE  Completed  . Hepatitis C Screening  Completed  . PNA vac Low Risk Adult  Completed      Plan:     Reviewed health maintenance screenings with patient today and relevant education, vaccines, and/or referrals were provided.   I have personally reviewed and noted the following in the patient's chart:   . Medical and social history . Use of alcohol, tobacco or illicit drugs  . Current medications and supplements . Functional ability and status . Nutritional status . Physical activity . Advanced directives . List of other physicians .  [name] Vitals . Screenings to include cognitive, depression, and falls . Referrals and appointments  In addition, I have reviewed and discussed with patient certain preventive  protocols, quality metrics, and best practice recommendations. A written personalized care plan for preventive services as well as general preventive health recommendations were  provided to patient.     Michiel Cowboy, RN  01/10/2019   Medical screening examination/treatment/procedure(s) were performed by non-physician practitioner and as supervising physician I was immediately available for consultation/collaboration. I agree with above. [name], MD

## 2019-01-10 ENCOUNTER — Telehealth: Payer: Self-pay | Admitting: Internal Medicine

## 2019-01-10 ENCOUNTER — Ambulatory Visit (INDEPENDENT_AMBULATORY_CARE_PROVIDER_SITE_OTHER): Payer: Medicare Other | Admitting: Internal Medicine

## 2019-01-10 ENCOUNTER — Other Ambulatory Visit: Payer: Self-pay

## 2019-01-10 ENCOUNTER — Ambulatory Visit (INDEPENDENT_AMBULATORY_CARE_PROVIDER_SITE_OTHER): Payer: Medicare Other | Admitting: *Deleted

## 2019-01-10 ENCOUNTER — Encounter: Payer: Self-pay | Admitting: Internal Medicine

## 2019-01-10 VITALS — BP 138/84 | HR 85 | Temp 98.7°F | Resp 16 | Ht 65.0 in | Wt 170.0 lb

## 2019-01-10 VITALS — BP 138/84 | HR 85 | Temp 98.7°F | Resp 17 | Ht 65.0 in | Wt 170.0 lb

## 2019-01-10 DIAGNOSIS — E782 Mixed hyperlipidemia: Secondary | ICD-10-CM

## 2019-01-10 DIAGNOSIS — M8949 Other hypertrophic osteoarthropathy, multiple sites: Secondary | ICD-10-CM

## 2019-01-10 DIAGNOSIS — M810 Age-related osteoporosis without current pathological fracture: Secondary | ICD-10-CM

## 2019-01-10 DIAGNOSIS — F419 Anxiety disorder, unspecified: Secondary | ICD-10-CM

## 2019-01-10 DIAGNOSIS — R7303 Prediabetes: Secondary | ICD-10-CM | POA: Diagnosis not present

## 2019-01-10 DIAGNOSIS — M1990 Unspecified osteoarthritis, unspecified site: Secondary | ICD-10-CM | POA: Insufficient documentation

## 2019-01-10 DIAGNOSIS — M159 Polyosteoarthritis, unspecified: Secondary | ICD-10-CM

## 2019-01-10 DIAGNOSIS — Z0000 Encounter for general adult medical examination without abnormal findings: Secondary | ICD-10-CM | POA: Diagnosis not present

## 2019-01-10 DIAGNOSIS — R252 Cramp and spasm: Secondary | ICD-10-CM | POA: Insufficient documentation

## 2019-01-10 DIAGNOSIS — I10 Essential (primary) hypertension: Secondary | ICD-10-CM

## 2019-01-10 MED ORDER — PROLIA 60 MG/ML ~~LOC~~ SOSY: PREFILLED_SYRINGE | SUBCUTANEOUS | 0 refills | Status: DC

## 2019-01-10 MED ORDER — TIZANIDINE HCL 4 MG PO TABS: 4.0000 mg | ORAL_TABLET | Freq: Every day | ORAL | 2 refills | Status: DC | PRN

## 2019-01-10 MED ORDER — MELOXICAM 7.5 MG PO TABS: 7.5000 mg | ORAL_TABLET | Freq: Every day | ORAL | 5 refills | Status: DC

## 2019-01-10 NOTE — Assessment & Plan Note (Addendum)
Getting prolia-due today  taking vitamin d Very active, some exercise

## 2019-01-10 NOTE — Assessment & Plan Note (Signed)
BP well controlled Current regimen effective and well tolerated Continue current medications at current doses cmp  

## 2019-01-10 NOTE — Assessment & Plan Note (Signed)
Check a1c Low sugar / carb diet Stressed regular exercise   

## 2019-01-10 NOTE — Telephone Encounter (Signed)
prolia rx sent to pharmacy---ok to get administered at [facility] at patient's convenience

## 2019-01-10 NOTE — Telephone Encounter (Signed)
Insurance has been submitted and received for Prolia. Patient would be responsible for a $255 copay. She has used [facility] in the past and will use it again this time. Per patient, it is still active. She will need Prolia sent to her pharmacy so that she can pick it up and bring it to the office to be given.  Can this be sent in for her please?

## 2019-01-10 NOTE — Assessment & Plan Note (Addendum)
Intermittent muscle cramping in legs Takes tizanidine prn Taking gabapentin nightly prn We will continue

## 2019-01-10 NOTE — Assessment & Plan Note (Signed)
Does have some increased anxiety and has had some very atypical chest tightness that seems to occur when she is more anxious She feels her medication is good so we will not make any changes today Chest tightness persists she will let me know so that we can evaluate further Discussed other natural ways to help with some of the increased anxiety

## 2019-01-10 NOTE — Assessment & Plan Note (Signed)
Taking mobic daily Discussed risks Will take prn

## 2019-01-10 NOTE — Patient Instructions (Signed)
Continue doing brain stimulating activities (puzzles, reading, adult coloring books, staying active) to keep memory sharp.   Continue to eat heart healthy diet (full of fruits, vegetables, whole grains, lean protein, water--limit salt, fat, and sugar intake) and increase physical activity as tolerated.   Kristina Ayala , Thank you for taking time to come for your Medicare Wellness Visit. I appreciate your ongoing commitment to your health goals. Please review the following plan we discussed and let me know if I can assist you in the future.   These are the goals we discussed: Goals      Patient Stated   . <enter goal here> (pt-stated)     Maintain current activity level.       Other   . 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         This is a list of the screening recommended for you and due dates:  Health Maintenance  Topic Date Due  . DEXA scan (bone density measurement)  07/06/2020  . Mammogram  07/23/2020  . Colon Cancer Screening  06/08/2022  . Tetanus Vaccine  07/28/2024  . Flu Shot  Completed  .  Hepatitis C: One time screening is recommended by Center for Disease Control  (CDC) for  adults born from [birth year] through 1966.   Completed  . Pneumonia vaccines  Completed    Preventive Care 56 Years and Older, Female Preventive care refers to lifestyle choices and visits with your health care provider that can promote health and wellness. This includes:  A yearly physical exam. This is also called an annual well check.  Regular dental and eye exams.  Immunizations.  Screening for certain conditions.  Healthy lifestyle choices, such as diet and exercise. What can I expect for my preventive care visit? Physical exam Your health care provider will check:  Height and weight. These may be used to calculate body mass index (BMI), which is a measurement that tells if you are at a healthy weight.  Heart rate and blood  pressure.  Your skin for abnormal spots. Counseling Your health care provider may ask you questions about:  Alcohol, tobacco, and drug use.  Emotional well-being.  Home and relationship well-being.  Sexual activity.  Eating habits.  History of falls.  Memory and ability to understand (cognition).  Work and work Statistician.  Pregnancy and menstrual history. What immunizations do I need?  Influenza (flu) vaccine  This is recommended every year. Tetanus, diphtheria, and pertussis (Tdap) vaccine  You may need a Td booster every 10 years. Varicella (chickenpox) vaccine  You may need this vaccine if you have not already been vaccinated. Zoster (shingles) vaccine  You may need this after age 40. Pneumococcal conjugate (PCV13) vaccine  One dose is recommended after age 70. Pneumococcal polysaccharide (PPSV23) vaccine  One dose is recommended after age 94. Measles, mumps, and rubella (MMR) vaccine  You may need at least one dose of MMR if you were born in 1957 or later. You may also need a second dose. Meningococcal conjugate (MenACWY) vaccine  You may need this if you have certain conditions. Hepatitis A vaccine  You may need this if you have certain conditions or if you travel or work in places where you may be exposed to hepatitis A. Hepatitis B vaccine  You may need this if you have certain conditions or if you travel or work in places where you may be exposed to hepatitis  B. Haemophilus influenzae type b (Hib) vaccine  You may need this if you have certain conditions. You may receive vaccines as individual doses or as more than one vaccine together in one shot (combination vaccines). Talk with your health care provider about the risks and benefits of combination vaccines. What tests do I need? Blood tests  Lipid and cholesterol levels. These may be checked every 5 years, or more frequently depending on your overall health.  Hepatitis C test.  Hepatitis B  test. Screening  Lung cancer screening. You may have this screening every year starting at age 34 if you have a 30-pack-year history of smoking and currently smoke or have quit within the past 15 years.  Colorectal cancer screening. All adults should have this screening starting at age 82 and continuing until age 36. Your health care provider may recommend screening at age 34 if you are at increased risk. You will have tests every 1-10 years, depending on your results and the type of screening test.  Diabetes screening. This is done by checking your blood sugar (glucose) after you have not eaten for a while (fasting). You may have this done every 1-3 years.  Mammogram. This may be done every 1-2 years. Talk with your health care provider about how often you should have regular mammograms.  BRCA-related cancer screening. This may be done if you have a family history of breast, ovarian, tubal, or peritoneal cancers. Other tests  Sexually transmitted disease (STD) testing.  Bone density scan. This is done to screen for osteoporosis. You may have this done starting at age 37. Follow these instructions at home: Eating and drinking  Eat a diet that includes fresh fruits and vegetables, whole grains, lean protein, and low-fat dairy products. Limit your intake of foods with high amounts of sugar, saturated fats, and salt.  Take vitamin and mineral supplements as recommended by your health care provider.  Do not drink alcohol if your health care provider tells you not to drink.  If you drink alcohol: ? Limit how much you have to 0-1 drink a day. ? Be aware of how much alcohol is in your drink. In the [place] one drink equals one 12 oz bottle of beer (355 mL), one 5 oz glass of Jasime Westergren (148 mL), or one 1 oz glass of hard liquor (44 mL). Lifestyle  Take daily care of your teeth and gums.  Stay active. Exercise for at least 30 minutes on 5 or more days each week.  Do not use any products that  contain nicotine or tobacco, such as cigarettes, e-cigarettes, and chewing tobacco. If you need help quitting, ask your health care provider.  If you are sexually active, practice safe sex. Use a condom or other form of protection in order to prevent STIs (sexually transmitted infections).  Talk with your health care provider about taking a low-dose aspirin or statin. What's next?  Go to your health care provider once a year for a well check visit.  Ask your health care provider how often you should have your eyes and teeth checked.  Stay up to date on all vaccines. This information is not intended to replace advice given to you by your health care provider. Make sure you discuss any questions you have with your health care provider. Document Released: 02/07/2015 Document Revised: 01/05/2018 Document Reviewed: 01/05/2018 Elsevier Patient Education  2020 [employer].

## 2019-01-24 ENCOUNTER — Other Ambulatory Visit: Payer: Self-pay

## 2019-01-24 ENCOUNTER — Ambulatory Visit (INDEPENDENT_AMBULATORY_CARE_PROVIDER_SITE_OTHER): Payer: Medicare Other

## 2019-01-24 DIAGNOSIS — M810 Age-related osteoporosis without current pathological fracture: Secondary | ICD-10-CM | POA: Diagnosis not present

## 2019-01-24 MED ORDER — DENOSUMAB 60 MG/ML ~~LOC~~ SOSY
60.0000 mg | PREFILLED_SYRINGE | Freq: Once | SUBCUTANEOUS | Status: AC
  Administered 2019-01-24: 60 mg via SUBCUTANEOUS

## 2019-01-30 NOTE — Progress Notes (Signed)
prolia Injection given.   Kristina Moyd [name], MD  

## 2019-02-12 NOTE — Telephone Encounter (Signed)
Waiting to run benefits.  She will be due in June.

## 2019-03-08 ENCOUNTER — Other Ambulatory Visit: Payer: Self-pay | Admitting: Internal Medicine

## 2019-03-13 ENCOUNTER — Ambulatory Visit: Payer: Medicare Other | Admitting: Family Medicine

## 2019-03-13 ENCOUNTER — Encounter: Payer: Self-pay | Admitting: Family Medicine

## 2019-03-13 ENCOUNTER — Ambulatory Visit: Payer: Self-pay

## 2019-03-13 ENCOUNTER — Other Ambulatory Visit: Payer: Self-pay

## 2019-03-13 VITALS — BP 140/86 | HR 95 | Ht 65.0 in | Wt 170.8 lb

## 2019-03-13 DIAGNOSIS — M533 Sacrococcygeal disorders, not elsewhere classified: Secondary | ICD-10-CM | POA: Diagnosis not present

## 2019-03-13 DIAGNOSIS — M5416 Radiculopathy, lumbar region: Secondary | ICD-10-CM | POA: Diagnosis not present

## 2019-03-13 DIAGNOSIS — M1712 Unilateral primary osteoarthritis, left knee: Secondary | ICD-10-CM

## 2019-03-13 DIAGNOSIS — M5442 Lumbago with sciatica, left side: Secondary | ICD-10-CM

## 2019-03-13 DIAGNOSIS — M7062 Trochanteric bursitis, left hip: Secondary | ICD-10-CM

## 2019-03-13 DIAGNOSIS — G8929 Other chronic pain: Secondary | ICD-10-CM

## 2019-03-13 NOTE — Patient Instructions (Addendum)
You had a L knee and L hip injection today. Call or go to the ER if you develop a large red swollen joint with extreme pain or oozing puss.   You should hear about scheduling the back injection soon.  If not please let me know or call [facility] to schedule yourself.  [phone]  Recheck with me or Dr Tamala Julian as needed.   Good luck with you 1st COVID-19 vaccine this week and happy birthday.

## 2019-03-13 NOTE — Progress Notes (Signed)
I, Wendy Poet, LAT, ATC, am serving as scribe for Dr. [name].  Kristina Ayala is a 74 y.o. female who presents to [place] at [facility] today for f/u of L hip pain.  She last saw Dr. Tamala Julian on 09/26/18 for her L knee and L hip and had both a L knee and L greater troch injection.  She was advised to take Vit D and use Voltaren gel.  Of note, pt also had a L L5 epidural on 12/05/18.  Since her last visit w/ Dr. Tamala Julian, pt reports increasing L hip pain over the last week and is interested in having another injection today.  She rates her pain at a 5/10 and describes her pain as sharp and is located at the posterior hip / buttocks/SI joint area.  She is also having increased L knee pain and would also like an injection for her L knee.  She reports some giving way of her L knee but denies any recent falls.  Pt states that she is also interested in getting another lumbar epidural.  Additionally she is planning to have a therapeutic massage today and needs me to write a letter for this.  She notes this does help her back pain.  Diagnostic testing: L hip XR- 03/17/17; L knee XR- 01/31/18; CT abdomen and pelvis- 11/09/18  Pertinent review of systems: No fevers or chills  Relevant historical information: History of COPD.  History of left knee fracture with current degenerative arthritis.   Exam:  BP 140/86 (BP Location: Left Arm, Patient Position: Sitting, Cuff Size: Normal)   Pulse 95   Ht 5\' 5"  (1.651 m)   Wt 170 lb 12.8 oz (77.5 kg)   SpO2 98%   BMI 28.42 kg/m  General: Well Developed, well nourished, and in no acute distress.   MSK:  L-spine: Nontender to spinal midline.  Tender palpation left SI joint. Decreased lumbar motion. Lower extremity strength is intact.  Left hip: Normal-appearing normal motion nontender overlying the greater trochanter.  Left knee: Mild effusion mature scar overlying anterior lateral knee.  No deformity or erythema. Not particularly  tender to palpation. Normal range of motion with crepitation. Stable ligamentous exam.    Lab and Radiology Results  Procedure: Real-time Ultrasound Guided Injection of left knee Device: Philips Affiniti 50G Images permanently stored and available for review in the ultrasound unit. Verbal informed consent obtained.  Discussed risks and benefits of procedure. Warned about infection bleeding damage to structures skin hypopigmentation and fat atrophy among others. Patient expresses understanding and agreement Time-out conducted.   Noted no overlying erythema, induration, or other signs of local infection.   Skin prepped in a sterile fashion.   Local anesthesia: Topical Ethyl chloride.   With sterile technique and under real time ultrasound guidance:  40 mg of Kenalog and 4 mL of Marcaine injected easily.   Completed without difficulty   Pain immediately resolved suggesting accurate placement of the medication.   Advised to call if fevers/chills, erythema, induration, drainage, or persistent bleeding.   Images permanently stored and available for review in the ultrasound unit.  Impression: Technically successful ultrasound guided injection.     Procedure: Real-time Ultrasound Guided Injection of left SI joint Device: Philips Affiniti 50G Images permanently stored and available for review in the ultrasound unit. Verbal informed consent obtained.  Discussed risks and benefits of procedure. Warned about infection bleeding damage to structures skin hypopigmentation and fat atrophy among others. Patient expresses understanding and agreement  Time-out conducted.   Noted no overlying erythema, induration, or other signs of local infection.   Skin prepped in a sterile fashion.   Local anesthesia: Topical Ethyl chloride.   With sterile technique and under real time ultrasound guidance:  40 mg of Kenalog and 4 mL of Marcaine injected easily.   Completed without difficulty   Pain immediately  resolved suggesting accurate placement of the medication.   Advised to call if fevers/chills, erythema, induration, drainage, or persistent bleeding.   Images permanently stored and available for review in the ultrasound unit.  Impression: Technically successful ultrasound guided injection.         Assessment and Plan: 74 y.o. female with pain left knee, left SI joint, and left lumbar radiculopathy. Left knee pain: Ongoing secondary to degenerative arthritis from fracture.  Patient is receiving somewhat regular injections and doing pretty well with typical recurrent steroid injections.  Plan to repeat injection today.  Emphasize quad strength.  Check back as needed.  Left SI joint pain: Different issue than was seen by Dr. Tamala Julian in October 2020.  At that time she had more trochanteric bursitis.  She does have a little bit of that today but the majority seems to be SI joint related.  Plan for SI joint injection.  Therapeutic massage also schedule today should be helpful as well.  Lumbar radiculopathy at L5.  Patient has had benefit with epidural steroid injections in the past we will reorder injection today as well.  Of note patient will receive her first COVID-19 vaccine Thursday, February 18.    Orders Placed This Encounter  Procedures  . [place] LIMITED JOINT SPACE STRUCTURES LOW LEFT(NO LINKED CHARGES)    Order Specific Question:   Reason for Exam (SYMPTOM  OR DIAGNOSIS REQUIRED)    Answer:   L hip pain    Order Specific Question:   Preferred imaging location?    Answer:   [place]  . DG Epidural/Nerve Root    Standing Status:   Future    Standing Expiration Date:   05/10/2020    Order Specific Question:   Reason for Exam (SYMPTOM  OR DIAGNOSIS REQUIRED)    Answer:   Left L5 repeat injection from Nov 2020    Order Specific Question:   Preferred imaging location?    Answer:   [facility]   No orders of the defined types were placed in this  encounter.    Discussed warning signs or symptoms. Please see discharge instructions. Patient expresses understanding.   The above documentation has been reviewed and is accurate and complete [name]

## 2019-03-19 NOTE — Progress Notes (Signed)
Subjective:    Patient ID: Kristina Ayala, female    DOB: February 11, 1945, 74 y.o.   MRN: [id number]  HPI The patient is here for an acute visit for SOB.    For the past two weeks when she takes our her garbage or gets the mail she feels SOB.  Once she rests the shortness of breath resolves.  She has noticed this with overexerting herself in the house on occasion.    She denies tightness in the chest, chest pain or palpitations.   She is not exercising regularly.  She denies changes in her medications.  She wondered initially if the shortness of breath was related to weight gain, but she has only gained 2 pounds in the last 2 months.  She is not experienced any coughing, wheezing or cold symptoms.  She does have a history of COPD, but has never needed an inhaler and has not had shortness of breath in the past.   Medications and allergies reviewed with patient and updated if appropriate.  Patient Active Problem List   Diagnosis Date Noted  . Chronic left SI joint pain 03/13/2019  . Leg cramping 01/10/2019  . Osteoarthritis 01/10/2019  . Right lower quadrant abdominal pain 11/09/2018  . Adnexal mass, right 11/09/2018  . Change in stool 10/18/2018  . Lung nodules 06/19/2018  . Abnormal findings on diagnostic imaging of lung 06/08/2018  . Thyromegaly 10/28/2017  . Restless leg syndrome 10/12/2017  . Greater trochanteric bursitis of left hip 07/26/2017  . Hypokalemia 04/18/2017  . Degenerative arthritis of left knee 12/01/2016  . Pilonidal cyst 12/01/2016  . Anxiety 12/01/2016  . Prediabetes 09/16/2016  . Bursitis of right shoulder 09/04/2015  . Lateral meniscus derangement 06/03/2015  . Tear of LCL (lateral collateral ligament) of knee 06/03/2015  . Lumbar radiculopathy 09/19/2014  . Low back pain 01/04/2014  . Bilateral knee pain 01/04/2014  . Diverticulosis of colon without hemorrhage 07/26/2013  . Trigger thumb of left hand 03/06/2013  . Essential hypertension, benign  02/15/2013  . Hyperlipidemia 09/28/2012  . COPD (chronic obstructive pulmonary disease) ([place]) 05/22/2012  . Osteoporosis 05/22/2012    Current Outpatient Medications on File Prior to Visit  Medication Sig Dispense Refill  . ALPRAZolam (XANAX) 0.5 MG tablet TAKE 1 TABLET(0.5 MG) BY MOUTH AT BEDTIME AS NEEDED FOR ANXIETY 30 tablet 0  . amLODipine (NORVASC) 5 MG tablet TAKE 1 TABLET(5 MG) BY MOUTH DAILY 90 tablet 1  . aspirin EC 81 MG tablet Take 81 mg by mouth daily.    [name] atorvastatin (LIPITOR) 40 MG tablet TAKE 1 TABLET BY MOUTH EVERY MORNING. NEED OFFICE VISIT 90 tablet 0  . Cholecalciferol (VITAMIN D3) 10000 units TABS Take 2 tablets by mouth.     . Cyanocobalamin (VITAMIN B-12) 2500 MCG SUBL Place 1 tablet under the tongue daily.    [name] denosumab (PROLIA) 60 MG/ML SOSY injection INJECT 60 MG INTO THE SKIN ONCE FOR 1 DOSE 1 mL 0  . ferrous sulfate 325 (65 FE) MG tablet Take 1 tablet (325 mg total) by mouth daily with breakfast. 30 tablet 3  . gabapentin (NEURONTIN) 300 MG capsule Take 1 capsule (300 mg total) by mouth at bedtime. 30 capsule 3  . hydrochlorothiazide (HYDRODIURIL) 25 MG tablet TAKE 1 TABLET(25 MG) BY MOUTH EVERY MORNING 90 tablet 3  . lisinopril (ZESTRIL) 40 MG tablet TAKE 1 TABLET(40 MG) BY MOUTH DAILY 90 tablet 1  . Magnesium 100 MG CAPS Take 100 mg by mouth daily.    [name]  meloxicam (MOBIC) 7.5 MG tablet Take 1 tablet (7.5 mg total) by mouth daily. 30 tablet 5  . potassium chloride (KLOR-CON) 8 MEQ tablet TAKE 2 TABLETS(16 MEQ) BY MOUTH DAILY 180 tablet 1  . tiZANidine (ZANAFLEX) 4 MG tablet Take 1 tablet (4 mg total) by mouth daily as needed for muscle spasms. 30 tablet 2  . TURMERIC PO Take 1 tablet by mouth daily.     No current facility-administered medications on file prior to visit.    Past Medical History:  Diagnosis Date  . Arthritis   . Bursitis    left elbow  . Clostridium difficile infection   . Frequent falls   . HLD (hyperlipidemia)   . Hypertension   .  Infectious colitis     Past Surgical History:  Procedure Laterality Date  . CATARACT EXTRACTION Bilateral   . COLONOSCOPY    . ORIF PROXIMAL TIBIAL PLATEAU FRACTURE Left 02/19/2012   knee  . POLYPECTOMY    . TONSILLECTOMY      Social History   Socioeconomic History  . Marital status: Divorced    Spouse name: Not on file  . Number of children: 2  . Years of education: Not on file  . Highest education level: Not on file  Occupational History  . Occupation: RETIRED  Tobacco Use  . Smoking status: Former Smoker    Packs/day: 0.25    Years: 50.00    Pack years: 12.50    Types: Cigarettes    Quit date: 08/28/2014    Years since quitting: 4.5  . Smokeless tobacco: Never Used  . Tobacco comment: 1 pack every 3 days  Substance and Sexual Activity  . Alcohol use: No    Alcohol/week: 0.0 standard drinks  . Drug use: No  . Sexual activity: Not Currently  Other Topics Concern  . Not on file  Social History Narrative   Lives alone has 2 supportive children.      Social Determinants of Health   Financial Resource Strain:   . Difficulty of Paying Living Expenses: Not on file  Food Insecurity:   . Worried About Charity fundraiser in the Last Year: Not on file  . Ran Out of Food in the Last Year: Not on file  Transportation Needs:   . Lack of Transportation (Medical): Not on file  . Lack of Transportation (Non-Medical): Not on file  Physical Activity:   . Days of Exercise per Week: Not on file  . Minutes of Exercise per Session: Not on file  Stress:   . Feeling of Stress : Not on file  Social Connections:   . Frequency of Communication with Friends and Family: Not on file  . Frequency of Social Gatherings with Friends and Family: Not on file  . Attends Religious Services: Not on file  . Active Member of Clubs or Organizations: Not on file  . Attends Archivist Meetings: Not on file  . Marital Status: Not on file    Family History  Problem Relation Age of  Onset  . Diverticulosis Mother   . Ovarian cancer Mother   . Pancreatic cancer Father   . Lung cancer Father   . Colon polyps Sister   . Colon cancer Neg Hx     Review of Systems  Constitutional: Negative for chills and fever.  Respiratory: Positive for shortness of breath. Negative for cough and wheezing.   Cardiovascular: Negative for chest pain, palpitations and leg swelling.  Neurological: Negative for dizziness, light-headedness  and headaches.       Objective:   Vitals:   03/20/19 0914  BP: (!) 150/92  Pulse: 88  Resp: 16  Temp: 99.1 F (37.3 C)  SpO2: 99%   BP Readings from Last 3 Encounters:  03/20/19 (!) 150/92  03/13/19 140/86  01/10/19 138/84   Wt Readings from Last 3 Encounters:  03/20/19 172 lb (78 kg)  03/13/19 170 lb 12.8 oz (77.5 kg)  01/10/19 170 lb (77.1 kg)   Body mass index is 28.62 kg/m.   Physical Exam    Constitutional: Appears well-developed and well-nourished. No distress.  Head: Normocephalic and atraumatic.  Neck: Neck supple. No tracheal deviation present. No thyromegaly present.  No cervical lymphadenopathy Cardiovascular: Normal rate, regular rhythm and normal heart sounds.  No murmur heard. No carotid bruit .  No edema Pulmonary/Chest: Effort normal and breath sounds normal. No respiratory distress. No has no wheezes. No rales.  Skin: Skin is warm and dry. Not diaphoretic.  Psychiatric: Normal mood and affect. Behavior is normal.       Assessment & Plan:    See Problem List for Assessment and Plan of chronic medical problems.    This visit occurred during the SARS-CoV-2 public health emergency.  Safety protocols were in place, including screening questions prior to the visit, additional usage of staff PPE, and extensive cleaning of exam room while observing appropriate contact time as indicated for disinfecting solutions.

## 2019-03-20 ENCOUNTER — Other Ambulatory Visit: Payer: Self-pay

## 2019-03-20 ENCOUNTER — Ambulatory Visit (INDEPENDENT_AMBULATORY_CARE_PROVIDER_SITE_OTHER): Payer: Medicare Other | Admitting: Internal Medicine

## 2019-03-20 ENCOUNTER — Encounter: Payer: Self-pay | Admitting: Internal Medicine

## 2019-03-20 VITALS — BP 150/92 | HR 88 | Temp 99.1°F | Resp 16 | Ht 65.0 in | Wt 172.0 lb

## 2019-03-20 DIAGNOSIS — R0600 Dyspnea, unspecified: Secondary | ICD-10-CM

## 2019-03-20 DIAGNOSIS — I10 Essential (primary) hypertension: Secondary | ICD-10-CM | POA: Diagnosis not present

## 2019-03-20 DIAGNOSIS — R7303 Prediabetes: Secondary | ICD-10-CM

## 2019-03-20 DIAGNOSIS — R0609 Other forms of dyspnea: Secondary | ICD-10-CM

## 2019-03-20 LAB — COMPREHENSIVE METABOLIC PANEL
ALT: 23 U/L (ref 0–35)
AST: 16 U/L (ref 0–37)
Albumin: 4.1 g/dL (ref 3.5–5.2)
Alkaline Phosphatase: 43 U/L (ref 39–117)
BUN: 18 mg/dL (ref 6–23)
CO2: 30 mEq/L (ref 19–32)
Calcium: 9 mg/dL (ref 8.4–10.5)
Chloride: 101 mEq/L (ref 96–112)
Creatinine, Ser: 0.76 mg/dL (ref 0.40–1.20)
GFR: 89.77 mL/min (ref >60.00)
Glucose, Bld: 83 mg/dL (ref 70–99)
Potassium: 4.1 mEq/L (ref 3.5–5.1)
Sodium: 138 mEq/L (ref 135–145)
Total Bilirubin: 0.5 mg/dL (ref 0.2–1.2)
Total Protein: 6.7 g/dL (ref 6.0–8.3)

## 2019-03-20 LAB — CBC WITH DIFFERENTIAL/PLATELET
Basophils Absolute: 0.1 10*3/uL (ref 0.0–0.1)
Basophils Relative: 0.9 % (ref 0.0–3.0)
Eosinophils Absolute: 0.1 10*3/uL (ref 0.0–0.7)
Eosinophils Relative: 1.3 % (ref 0.0–5.0)
HCT: 38.5 % (ref 36.0–46.0)
Hemoglobin: 12.5 g/dL (ref 12.0–15.0)
Lymphocytes Relative: 22.1 % (ref 12.0–46.0)
Lymphs Abs: 2.4 10*3/uL (ref 0.7–4.0)
MCHC: 32.5 g/dL (ref 30.0–36.0)
MCV: 82.4 fl (ref 78.0–100.0)
Monocytes Absolute: 1 10*3/uL (ref 0.1–1.0)
Monocytes Relative: 9.6 % (ref 3.0–12.0)
Neutro Abs: 7.2 10*3/uL (ref 1.4–7.7)
Neutrophils Relative %: 66.1 % (ref 43.0–77.0)
Platelets: 306 10*3/uL (ref 150.0–400.0)
RBC: 4.67 Mil/uL (ref 3.87–5.11)
RDW: 15.7 % — ABNORMAL HIGH (ref 11.5–15.5)
WBC: 10.9 10*3/uL — ABNORMAL HIGH (ref 4.0–10.5)

## 2019-03-20 MED ORDER — AMLODIPINE BESYLATE 5 MG PO TABS: 10.0000 mg | ORAL_TABLET | Freq: Every day | ORAL | 1 refills | Status: DC

## 2019-03-20 NOTE — Assessment & Plan Note (Signed)
Chronic Has not been monitoring her blood pressure at home BP elevated here and has been borderline high here on previous visits Increase amlodipine to 10 mg daily Continue hydrochlorothiazide and lisinopril at current doses CMP

## 2019-03-20 NOTE — Assessment & Plan Note (Signed)
Acute-started 2 weeks ago She feels short of breath when she takes out the garbage or gets the mail or over exerts herself inside the house Concerning for angina equivalent, COPD, anemia, deconditioning EKG today shows normal sinus rhythm at 86 bpm, possible left atrial enlargement, no acute changes.  No concerning changes from EKGs from 2019 CBC, CMP We will refer to cardiology to help rule out cardiac cause

## 2019-03-20 NOTE — Patient Instructions (Signed)
  Blood work was ordered.   An EKG was done today.    Medications reviewed and updated.  Changes include :   Increase amlodipine to 10 mg daily.   Your prescription(s) have been submitted to your pharmacy. Please take as directed and contact our office if you believe you are having problem(s) with the medication(s).  A referral was ordered for cardiology

## 2019-03-20 NOTE — Assessment & Plan Note (Signed)
Chronic We will check A1c Encouraged regular exercise

## 2019-03-22 LAB — HEMOGLOBIN A1C: Hgb A1c MFr Bld: 6.1 % (ref 4.6–6.5)

## 2019-03-28 ENCOUNTER — Other Ambulatory Visit: Payer: Self-pay

## 2019-03-28 ENCOUNTER — Ambulatory Visit (INDEPENDENT_AMBULATORY_CARE_PROVIDER_SITE_OTHER): Payer: Medicare Other | Admitting: Cardiology

## 2019-03-28 ENCOUNTER — Encounter: Payer: Self-pay | Admitting: Cardiology

## 2019-03-28 VITALS — BP 140/78 | HR 91 | Temp 97.5°F | Ht 64.0 in | Wt 170.0 lb

## 2019-03-28 DIAGNOSIS — I2510 Atherosclerotic heart disease of native coronary artery without angina pectoris: Secondary | ICD-10-CM

## 2019-03-28 DIAGNOSIS — R0600 Dyspnea, unspecified: Secondary | ICD-10-CM

## 2019-03-28 DIAGNOSIS — I10 Essential (primary) hypertension: Secondary | ICD-10-CM | POA: Diagnosis not present

## 2019-03-28 DIAGNOSIS — I700 Atherosclerosis of aorta: Secondary | ICD-10-CM

## 2019-03-28 DIAGNOSIS — Z7189 Other specified counseling: Secondary | ICD-10-CM | POA: Diagnosis not present

## 2019-03-28 DIAGNOSIS — E7800 Pure hypercholesterolemia, unspecified: Secondary | ICD-10-CM

## 2019-03-28 DIAGNOSIS — R0609 Other forms of dyspnea: Secondary | ICD-10-CM

## 2019-03-28 NOTE — Progress Notes (Signed)
Cardiology Office Note:    Date:  03/28/2019   ID:  VERNEICE REXROAD, DOB 11/13/1945, MRN [id number]  PCP:  [name], MD  Cardiologist:  Buford Dresser, MD  Referring MD: [name], MD   CC: new patient evaluation for dyspnea on exertion  History of Present Illness:    Karely A Stronach is a 74 y.o. female with a hx of hypertension, COPD, hyperlipidemia who is seen as a new patient to me at the request of Quay Burow, Claudina Lick, MD for the evaluation and management of dyspnea on exertion. She was last seen in the ER by Dr. Johnsie Cancel 03/2017.  Note from Dr. Quay Burow dated 03/20/19 reviewed. Patient noted 2 weeks of shortness of breath with activities such as taking out the garbage or getting the mail. SOB improves with rest. No chest pain.   Prior cardiac studies reviewed. Echo 03/2017 EF 55-60%, G1DD, prominent apical trabeculation. Myoview 03/2017, 12/2014 normal. CT showed aortic atherosclerosis and coronary atherosclerosis 05/2018.   She confirms that she has not been able to do what she thinks she should be able to do. There is no clear onset of this that she can tell. She is now cooking, cleaning, shopping, do laundry, doing household chores. Doing much more of this now that she is no longer living alone. Daughter and granddaughter moved in with her, though she lives in a senior living community. This is stressful to her.   She cannot say if there is anything specific that she has wanted to do that she cannot do. Feels that when she pushes herself, she cannot catch her breath. Improves after resting, but can take 30 minutes to an hour before she feels back to normal. By the end of the day, she is very tired.   Denies chest pain, shortness of breath at rest. No PND, orthopnea, LE edema or unexpected weight gain. No syncope. Rare heart racing when she pushes herself.  Past Medical History:  Diagnosis Date  . Arthritis   . Bursitis    left elbow  . Clostridium difficile infection    . Frequent falls   . HLD (hyperlipidemia)   . Hypertension   . Infectious colitis     Past Surgical History:  Procedure Laterality Date  . CATARACT EXTRACTION Bilateral   . COLONOSCOPY    . ORIF PROXIMAL TIBIAL PLATEAU FRACTURE Left 02/19/2012   knee  . POLYPECTOMY    . TONSILLECTOMY      Current Medications: Current Outpatient Medications on File Prior to Visit  Medication Sig  . ALPRAZolam (XANAX) 0.5 MG tablet TAKE 1 TABLET(0.5 MG) BY MOUTH AT BEDTIME AS NEEDED FOR ANXIETY  . amLODipine (NORVASC) 5 MG tablet Take 2 tablets (10 mg total) by mouth daily.  [name] aspirin EC 81 MG tablet Take 81 mg by mouth daily.  [name] atorvastatin (LIPITOR) 40 MG tablet TAKE 1 TABLET BY MOUTH EVERY MORNING. NEED OFFICE VISIT  . Cholecalciferol (VITAMIN D3) 10000 units TABS Take 2 tablets by mouth.   . Cyanocobalamin (VITAMIN B-12) 2500 MCG SUBL Place 1 tablet under the tongue daily.  [name] denosumab (PROLIA) 60 MG/ML SOSY injection INJECT 60 MG INTO THE SKIN ONCE FOR 1 DOSE  . ferrous sulfate 325 (65 FE) MG tablet Take 1 tablet (325 mg total) by mouth daily with breakfast.  . gabapentin (NEURONTIN) 300 MG capsule Take 1 capsule (300 mg total) by mouth at bedtime.  . hydrochlorothiazide (HYDRODIURIL) 25 MG tablet TAKE 1 TABLET(25 MG) BY MOUTH EVERY  MORNING  . lisinopril (ZESTRIL) 40 MG tablet TAKE 1 TABLET(40 MG) BY MOUTH DAILY  . Magnesium 100 MG CAPS Take 100 mg by mouth daily.  . meloxicam (MOBIC) 7.5 MG tablet Take 1 tablet (7.5 mg total) by mouth daily.  . potassium chloride (KLOR-CON) 8 MEQ tablet TAKE 2 TABLETS(16 MEQ) BY MOUTH DAILY  . tiZANidine (ZANAFLEX) 4 MG tablet Take 1 tablet (4 mg total) by mouth daily as needed for muscle spasms.  . TURMERIC PO Take 1 tablet by mouth daily.   No current facility-administered medications on file prior to visit.     Allergies:   Doxycycline, Minocycline, Wellbutrin [bupropion], and Penicillins   Social History   Tobacco Use  . Smoking status: Former  Smoker    Packs/day: 0.25    Years: 50.00    Pack years: 12.50    Types: Cigarettes    Quit date: 08/28/2014    Years since quitting: 4.5  . Smokeless tobacco: Never Used  . Tobacco comment: 1 pack every 3 days  Substance Use Topics  . Alcohol use: No    Alcohol/week: 0.0 standard drinks  . Drug use: No    Family History: family history includes Colon polyps in her sister; Diverticulosis in her mother; Lung cancer in her father; Ovarian cancer in her mother; Pancreatic cancer in her father. There is no history of Colon cancer.  ROS:   Please see the history of present illness.  Additional pertinent ROS: Constitutional: Negative for chills, fever, night sweats, unintentional weight loss  HENT: Negative for ear pain and hearing loss.   Eyes: Negative for loss of vision and eye pain.  Respiratory: Negative for cough, sputum, wheezing.   Cardiovascular: See HPI. Gastrointestinal: Negative for abdominal pain, melena, and hematochezia.  Genitourinary: Negative for dysuria and hematuria.  Musculoskeletal: Negative for falls and myalgias.  Skin: Negative for itching and rash.  Neurological: Negative for focal weakness, focal sensory changes and loss of consciousness.  Endo/Heme/Allergies: Does not bruise/bleed easily.     EKGs/Labs/Other Studies Reviewed:    The following studies were reviewed today: Echo 3.26.2019 - Left ventricle: The cavity size was normal. Wall thickness was  normal. Systolic function was normal. The estimated ejection  fraction was in the range of 55% to 60%. Wall motion was normal;  there were no regional wall motion abnormalities. Doppler  parameters are consistent with abnormal left ventricular  relaxation (grade 1 diastolic dysfunction).   Impressions:   - Normal LV function; mild diastolic dysfunction; prominent apical  trabeculae.   Myoview 2016  The left ventricular ejection fraction is hyperdynamic (>65%).  Nuclear stress EF:  67%.  There was no ST segment deviation noted during stress.  The study is normal.  This is a low risk study. No ischemia.  EKG:  EKG is personally reviewed.  The ekg ordered today demonstrates NSR  Recent Labs: 03/20/2019: ALT 23; BUN 18; Creatinine, Ser 0.76; Hemoglobin 12.5; Platelets 306.0; Potassium 4.1; Sodium 138  Recent Lipid Panel    Component Value Date/Time   CHOL 173 07/07/2018 1120   TRIG 110.0 07/07/2018 1120   HDL 61.30 07/07/2018 1120   CHOLHDL 3 07/07/2018 1120   VLDL 22.0 07/07/2018 1120   LDLCALC 90 07/07/2018 1120   LDLDIRECT 213.1 05/22/2012 1536    Physical Exam:    VS:  BP 140/78   Pulse 91   Temp (!) 97.5 F (36.4 C)   Ht 5\' 4"  (1.626 m)   Wt 170 lb (77.1 kg)  SpO2 96%   BMI 29.18 kg/m     Wt Readings from Last 3 Encounters:  03/28/19 170 lb (77.1 kg)  03/20/19 172 lb (78 kg)  03/13/19 170 lb 12.8 oz (77.5 kg)    GEN: Well nourished, well developed in no acute distress HEENT: Normal, moist mucous membranes NECK: No JVD CARDIAC: regular rhythm, normal S1 and S2, no rubs or gallops. 2/6 SEM at RUSB VASCULAR: Radial and DP pulses 2+ bilaterally. No carotid bruits RESPIRATORY:  Clear to auscultation without rales, wheezing or rhonchi  ABDOMEN: Soft, non-tender, non-distended MUSCULOSKELETAL:  Ambulates independently SKIN: Warm and dry, no edema NEUROLOGIC:  Alert and oriented x 3. No focal neuro deficits noted. PSYCHIATRIC:  Normal affect    ASSESSMENT:    1. Aortic atherosclerosis ([place])   2. Coronary artery calcification seen on CT scan   3. DOE (dyspnea on exertion)   4. Cardiac risk counseling   5. Counseling on health promotion and disease prevention   6. Essential hypertension   7. Pure hypercholesterolemia    PLAN:    Dyspnea on exertion, cardiac murmur: Echo 2019 largely unremarkable, no significant aortic valve disease noted -given progression of symptoms, will order echocardiogram to exclude cardiac structural  etiology  Aortic atherosclerosis, coronary calcification seen on CT: prior stress test without ischemia. Normal nuclear stress test in 2016.  -we discussed repeat stress testing today given her symptoms. She would like to see what the echo shows first. -continue aspirin 81 mg, atorvastatin 40 mg daily  Hypertension:  -continue amlodipine 5 mg daily -continue HCTZ 25 mg daily -continue lisinopril 40 mg daily  Hypercholesterolemia: -continue atorvastatin 40 mg daily  Cardiac risk counseling and prevention recommendations: -recommend heart healthy/Mediterranean diet, with whole grains, fruits, vegetable, fish, lean meats, nuts, and olive oil. Limit salt. -recommend moderate walking, 3-5 times/week for 30-50 minutes each session. Aim for at least 150 minutes.week. Goal should be pace of 3 miles/hours, or walking 1.5 miles in 30 minutes -recommend avoidance of tobacco products. Avoid excess alcohol. -ASCVD risk score: The 10-year ASCVD risk score Mikey Bussing DC Brooke Bonito., et al., 2013) is: 17.5%   Values used to calculate the score:     Age: 21 years     Sex: Female     Is Non-Hispanic African American: Yes     Diabetic: No     Tobacco smoker: No     Systolic Blood Pressure: [id number] mmHg     Is BP treated: Yes     HDL Cholesterol: 61.3 mg/dL     Total Cholesterol: 173 mg/dL    Plan for follow up: to be determined based on results of echo  Total time of encounter: 47 minutes total time of encounter, including 37 minutes spent in face-to-face patient care. This time includes coordination of care and counseling regarding her symptoms, CV risk factors and management. Remainder of non-face-to-face time involved reviewing chart documents/testing relevant to the patient encounter and documentation in the medical record.  Buford Dresser, MD, PhD [place]  [facility]   Buford Dresser, MD, PhD [place]  [facility]    Medication Adjustments/Labs and Tests Ordered: Current  medicines are reviewed at length with the patient today.  Concerns regarding medicines are outlined above.  Orders Placed This Encounter  Procedures  . EKG 12-Lead  . ECHOCARDIOGRAM COMPLETE   No orders of the defined types were placed in this encounter.   Patient Instructions  Medication Instructions:  Your Physician recommend you continue on your current medication  as directed.    *If you need a refill on your cardiac medications before your next appointment, please call your pharmacy*   Lab Work: None   Testing/Procedures: Your physician has requested that you have an echocardiogram. Echocardiography is a painless test that uses sound waves to create images of your heart. It provides your doctor with information about the size and shape of your heart and how well your heart's chambers and valves are working. This procedure takes approximately one hour. There are no restrictions for this procedure. [place] [address]    Follow-Up: At [facility], you and your health needs are our priority.  As part of our continuing mission to provide you with exceptional heart care, we have created designated Provider Care Teams.  These Care Teams include your primary Cardiologist (physician) and Advanced Practice Providers (APPs -  Physician Assistants and Nurse Practitioners) who all work together to provide you with the care you need, when you need it.  We recommend signing up for the patient portal called "MyChart".  Sign up information is provided on this After Visit Summary.  MyChart is used to connect with patients for Virtual Visits (Telemedicine).  Patients are able to view lab/test results, encounter notes, upcoming appointments, etc.  Non-urgent messages can be sent to your provider as well.   To learn more about what you can do with MyChart, go to [email or web address].    Your next appointment:   As needed   The format for your next appointment:   Either In Person  or Virtual  Provider:   Buford Dresser, MD       Signed, Buford Dresser, MD PhD 03/28/2019    [place]

## 2019-03-28 NOTE — Patient Instructions (Signed)
Medication Instructions:  Your Physician recommend you continue on your current medication as directed.    *If you need a refill on your cardiac medications before your next appointment, please call your pharmacy*   Lab Work: None   Testing/Procedures: Your physician has requested that you have an echocardiogram. Echocardiography is a painless test that uses sound waves to create images of your heart. It provides your doctor with information about the size and shape of your heart and how well your heart's chambers and valves are working. This procedure takes approximately one hour. There are no restrictions for this procedure. [place] [address]    Follow-Up: At [facility], you and your health needs are our priority.  As part of our continuing mission to provide you with exceptional heart care, we have created designated Provider Care Teams.  These Care Teams include your primary Cardiologist (physician) and Advanced Practice Providers (APPs -  Physician Assistants and Nurse Practitioners) who all work together to provide you with the care you need, when you need it.  We recommend signing up for the patient portal called "MyChart".  Sign up information is provided on this After Visit Summary.  MyChart is used to connect with patients for Virtual Visits (Telemedicine).  Patients are able to view lab/test results, encounter notes, upcoming appointments, etc.  Non-urgent messages can be sent to your provider as well.   To learn more about what you can do with MyChart, go to [email or web address].    Your next appointment:   As needed   The format for your next appointment:   Either In Person or Virtual  Provider:   Buford Dresser, MD

## 2019-03-29 IMAGING — MG DIGITAL SCREENING BILATERAL MAMMOGRAM WITH CAD
4 series · 4 of 4 positions shown · non-contrast
Comparison: Previous exam(s).

CLINICAL DATA: Screening.

EXAM:
DIGITAL SCREENING BILATERAL MAMMOGRAM WITH CAD

[R CC]
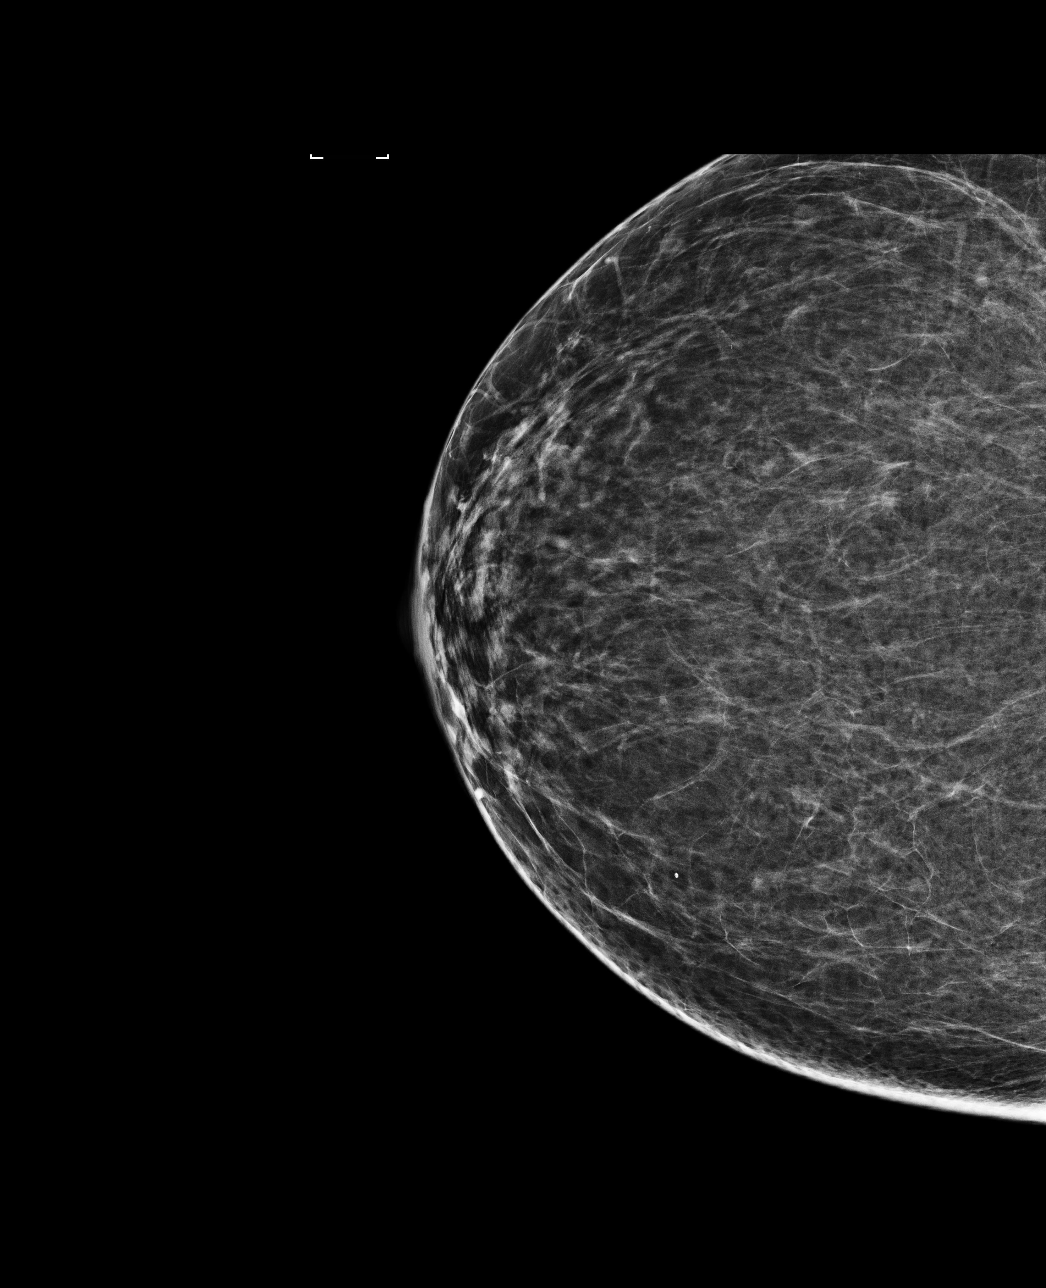

[R MLO]
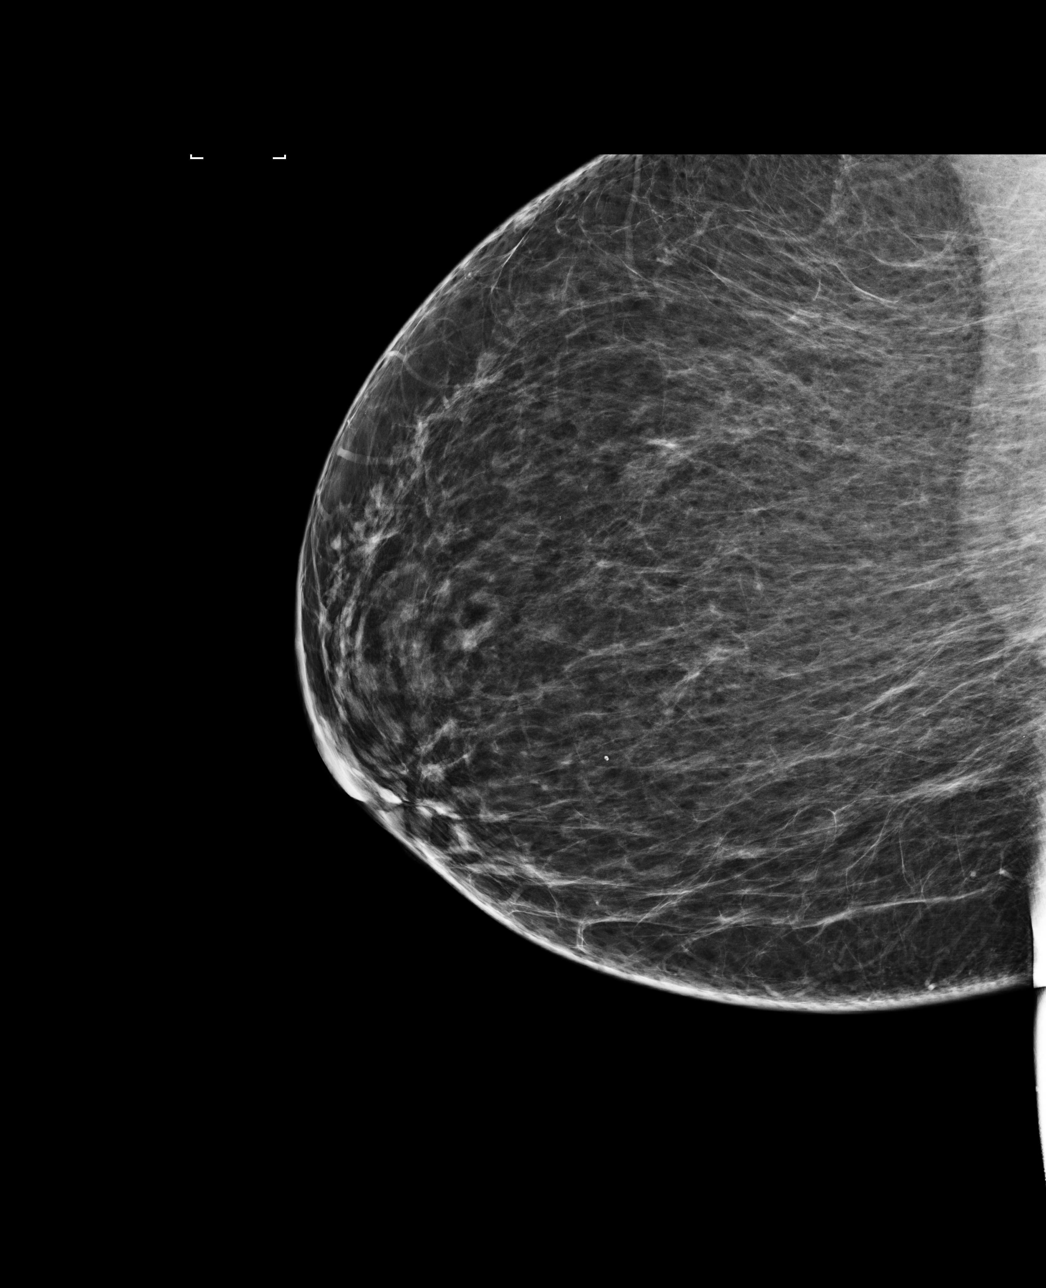

[L MLO]
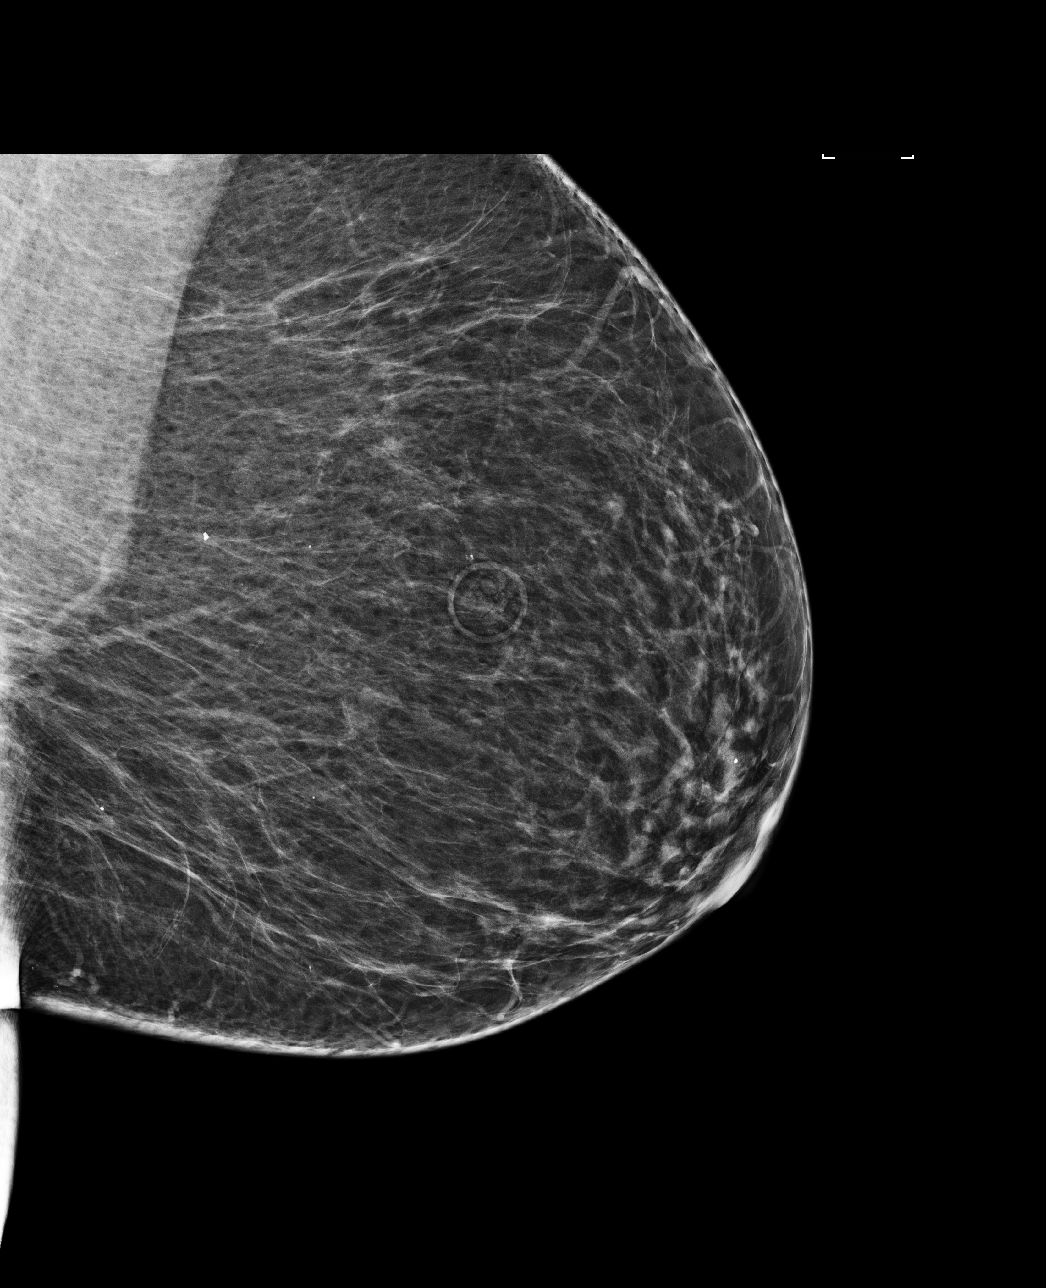

[L CC]
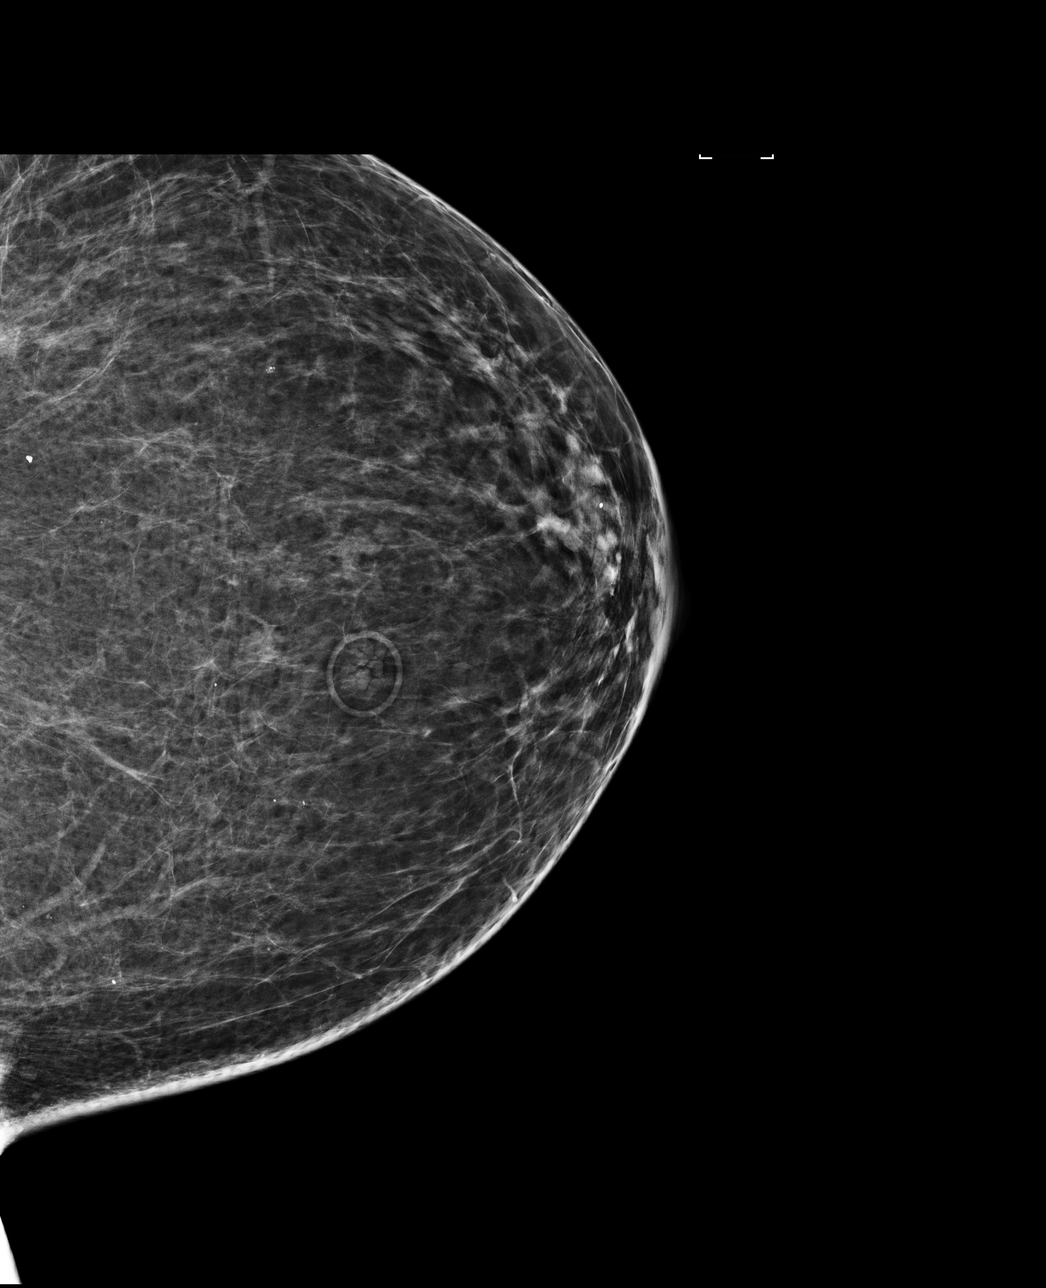

[4 of 4 positions shown; findings below may reference images not displayed]

ACR Breast Density Category b: There are scattered areas of
fibroglandular density.
FINDINGS: There are no findings suspicious for malignancy. Images were
processed with CAD.
IMPRESSION: No mammographic evidence of malignancy. A result letter of this
screening mammogram will be mailed directly to the patient.

RECOMMENDATION:
Screening mammogram in one year. (Code:AS-G-LCT)

BI-RADS CATEGORY  1: Negative.

## 2019-04-11 ENCOUNTER — Ambulatory Visit (HOSPITAL_COMMUNITY): Payer: Medicare Other | Attending: Internal Medicine

## 2019-04-11 ENCOUNTER — Other Ambulatory Visit: Payer: Self-pay

## 2019-04-11 DIAGNOSIS — R0600 Dyspnea, unspecified: Secondary | ICD-10-CM | POA: Insufficient documentation

## 2019-04-11 DIAGNOSIS — R0609 Other forms of dyspnea: Secondary | ICD-10-CM

## 2019-04-12 ENCOUNTER — Telehealth: Payer: Self-pay | Admitting: Cardiology

## 2019-04-12 ENCOUNTER — Other Ambulatory Visit: Payer: Self-pay

## 2019-04-12 DIAGNOSIS — R0602 Shortness of breath: Secondary | ICD-10-CM

## 2019-04-12 NOTE — Telephone Encounter (Signed)
Pt updated and verbalized understanding.  

## 2019-04-12 NOTE — Telephone Encounter (Signed)
Follow Up:   Pt calling back, concerning her Echo results.

## 2019-04-15 ENCOUNTER — Other Ambulatory Visit: Payer: Self-pay | Admitting: Internal Medicine

## 2019-04-16 ENCOUNTER — Ambulatory Visit
Admission: RE | Admit: 2019-04-16 | Discharge: 2019-04-16 | Disposition: A | Discharge status: Home / Self Care | Payer: Medicare Other | Source: Ambulatory Visit | Attending: Family Medicine | Admitting: Family Medicine

## 2019-04-16 ENCOUNTER — Other Ambulatory Visit: Payer: Self-pay

## 2019-04-16 ENCOUNTER — Encounter (INDEPENDENT_AMBULATORY_CARE_PROVIDER_SITE_OTHER): Payer: Self-pay

## 2019-04-16 DIAGNOSIS — M5416 Radiculopathy, lumbar region: Secondary | ICD-10-CM

## 2019-04-16 DIAGNOSIS — M545 Low back pain: Secondary | ICD-10-CM | POA: Diagnosis not present

## 2019-04-16 MED ORDER — METHYLPREDNISOLONE ACETATE 40 MG/ML INJ SUSP (RADIOLOG
120.0000 mg | Freq: Once | INTRAMUSCULAR | Status: AC
  Administered 2019-04-16: 120 mg via EPIDURAL

## 2019-04-16 MED ORDER — IOPAMIDOL (ISOVUE-M 200) INJECTION 41%
1.0000 mL | Freq: Once | INTRAMUSCULAR | Status: AC
  Administered 2019-04-16: 1 mL via EPIDURAL

## 2019-04-16 NOTE — Discharge Instructions (Signed)

## 2019-04-17 ENCOUNTER — Other Ambulatory Visit (HOSPITAL_COMMUNITY): Payer: Medicare Other

## 2019-04-17 ENCOUNTER — Other Ambulatory Visit: Payer: Medicare Other

## 2019-04-20 ENCOUNTER — Telehealth (HOSPITAL_COMMUNITY): Payer: Self-pay

## 2019-04-20 NOTE — Telephone Encounter (Signed)
Encounter complete. 

## 2019-04-25 ENCOUNTER — Other Ambulatory Visit: Payer: Self-pay | Admitting: Internal Medicine

## 2019-04-26 ENCOUNTER — Ambulatory Visit (HOSPITAL_COMMUNITY)
Admission: RE | Admit: 2019-04-26 | Discharge: 2019-04-26 | Disposition: A | Discharge status: Home / Self Care | Payer: Medicare Other | Source: Ambulatory Visit | Attending: Cardiology | Admitting: Cardiology

## 2019-04-26 ENCOUNTER — Other Ambulatory Visit: Payer: Self-pay

## 2019-04-26 DIAGNOSIS — R0602 Shortness of breath: Secondary | ICD-10-CM | POA: Diagnosis not present

## 2019-04-26 LAB — MYOCARDIAL PERFUSION IMAGING
LV dias vol: 72 mL (ref 46–106)
LV sys vol: 27 mL
Peak HR: 112 {beats}/min
Rest HR: 76 {beats}/min
SDS: 0
SRS: 0
SSS: 0
TID: 0.93

## 2019-04-26 MED ORDER — REGADENOSON 0.4 MG/5ML IV SOLN
0.4000 mg | Freq: Once | INTRAVENOUS | Status: AC
  Administered 2019-04-26: 0.4 mg via INTRAVENOUS

## 2019-04-26 MED ORDER — TECHNETIUM TC 99M TETROFOSMIN IV KIT
29.7000 | PACK | Freq: Once | INTRAVENOUS | Status: AC | PRN
  Administered 2019-04-26: 29.7 via INTRAVENOUS
  Filled 2019-04-26: qty 30

## 2019-04-26 MED ORDER — TECHNETIUM TC 99M TETROFOSMIN IV KIT
10.7000 | PACK | Freq: Once | INTRAVENOUS | Status: AC | PRN
  Administered 2019-04-26: 10.7 via INTRAVENOUS
  Filled 2019-04-26: qty 11

## 2019-05-02 NOTE — Progress Notes (Signed)
Subjective:    Patient ID: Kristina Ayala, female    DOB: Nov 15, 1945, 74 y.o.   MRN: [id number]  HPI The patient is here for an acute visit.   She had a stress test on Tuesday, two days ago, and had an IV in her right hand.  The IV came out and it was still bleeding.  She had pressure gauze placed on it.  She left, ate and went to a hair appt.  When she got home her wrist hurt.  She took off the bandage and it was discolored from the bandage being too tight and cutting off the circulation.  She called the nurse and was advised to move the hand and take tylenol.  The next day the discoloration was improved, but she developed a knot in the hand.  It was burning and red. She called the nurse back.  She said there may be an infection.  She took 2 amoxicillin her neighbor had.   Her symptoms improved.    She denies fever or chills.  She denies any numbness or tingling.  She denies swelling in her hand.  She wanted to make sure the infection was completely treated.  At her last visit we increased her amlodipine to 10 mg instead of 5.  She states she took the 10 mg once and did not feel good and went back to the 5.  She has not been monitoring her blood pressure at home.  Medications and allergies reviewed with patient and updated if appropriate.  Patient Active Problem List   Diagnosis Date Noted  . Cellulitis 05/03/2019  . DOE (dyspnea on exertion) 03/20/2019  . Chronic left SI joint pain 03/13/2019  . Leg cramping 01/10/2019  . Osteoarthritis 01/10/2019  . Right lower quadrant abdominal pain 11/09/2018  . Adnexal mass, right 11/09/2018  . Change in stool 10/18/2018  . Lung nodules 06/19/2018  . Abnormal findings on diagnostic imaging of lung 06/08/2018  . Thyromegaly 10/28/2017  . Restless leg syndrome 10/12/2017  . Greater trochanteric bursitis of left hip 07/26/2017  . Hypokalemia 04/18/2017  . Degenerative arthritis of left knee 12/01/2016  . Anxiety 12/01/2016  .  Prediabetes 09/16/2016  . Bursitis of right shoulder 09/04/2015  . Lateral meniscus derangement 06/03/2015  . Tear of LCL (lateral collateral ligament) of knee 06/03/2015  . Lumbar radiculopathy 09/19/2014  . Low back pain 01/04/2014  . Bilateral knee pain 01/04/2014  . Diverticulosis of colon without hemorrhage 07/26/2013  . Trigger thumb of left hand 03/06/2013  . Essential hypertension, benign 02/15/2013  . Hyperlipidemia 09/28/2012  . COPD (chronic obstructive pulmonary disease) ([place]) 05/22/2012  . Osteoporosis 05/22/2012    Current Outpatient Medications on File Prior to Visit  Medication Sig Dispense Refill  . ALPRAZolam (XANAX) 0.5 MG tablet TAKE 1 TABLET(0.5 MG) BY MOUTH AT BEDTIME AS NEEDED FOR ANXIETY 30 tablet 0  . amLODipine (NORVASC) 5 MG tablet Take 2 tablets (10 mg total) by mouth daily. 180 tablet 1  . aspirin EC 81 MG tablet Take 81 mg by mouth daily.    [name] atorvastatin (LIPITOR) 40 MG tablet TAKE 1 TABLET BY MOUTH EVERY MORNING. NEED OFFICE VISIT 90 tablet 0  . Cholecalciferol (VITAMIN D3) 10000 units TABS Take 2 tablets by mouth.     . Cyanocobalamin (VITAMIN B-12) 2500 MCG SUBL Place 1 tablet under the tongue daily.    [name] denosumab (PROLIA) 60 MG/ML SOSY injection INJECT 60 MG INTO THE SKIN ONCE FOR 1 DOSE 1  mL 0  . ferrous sulfate 325 (65 FE) MG tablet Take 1 tablet (325 mg total) by mouth daily with breakfast. 30 tablet 3  . gabapentin (NEURONTIN) 300 MG capsule Take 1 capsule (300 mg total) by mouth at bedtime. 30 capsule 3  . hydrochlorothiazide (HYDRODIURIL) 25 MG tablet TAKE 1 TABLET(25 MG) BY MOUTH EVERY MORNING 90 tablet 3  . lisinopril (ZESTRIL) 40 MG tablet TAKE 1 TABLET(40 MG) BY MOUTH DAILY 90 tablet 1  . Magnesium 100 MG CAPS Take 100 mg by mouth daily.    . meloxicam (MOBIC) 7.5 MG tablet Take 1 tablet (7.5 mg total) by mouth daily. 30 tablet 5  . potassium chloride (KLOR-CON) 8 MEQ tablet TAKE 2 TABLETS(16 MEQ) BY MOUTH DAILY 180 tablet 1  . tiZANidine  (ZANAFLEX) 4 MG tablet Take 1 tablet (4 mg total) by mouth daily as needed for muscle spasms. 30 tablet 2  . TURMERIC PO Take 1 tablet by mouth daily.     No current facility-administered medications on file prior to visit.    Past Medical History:  Diagnosis Date  . Arthritis   . Bursitis    left elbow  . Clostridium difficile infection   . Frequent falls   . HLD (hyperlipidemia)   . Hypertension   . Infectious colitis     Past Surgical History:  Procedure Laterality Date  . CATARACT EXTRACTION Bilateral   . COLONOSCOPY    . ORIF PROXIMAL TIBIAL PLATEAU FRACTURE Left 02/19/2012   knee  . POLYPECTOMY    . TONSILLECTOMY      Social History   Socioeconomic History  . Marital status: Divorced    Spouse name: Not on file  . Number of children: 2  . Years of education: Not on file  . Highest education level: Not on file  Occupational History  . Occupation: RETIRED  Tobacco Use  . Smoking status: Former Smoker    Packs/day: 0.25    Years: 50.00    Pack years: 12.50    Types: Cigarettes    Quit date: 08/28/2014    Years since quitting: 4.6  . Smokeless tobacco: Never Used  . Tobacco comment: 1 pack every 3 days  Substance and Sexual Activity  . Alcohol use: No    Alcohol/week: 0.0 standard drinks  . Drug use: No  . Sexual activity: Not Currently  Other Topics Concern  . Not on file  Social History Narrative   Lives alone has 2 supportive children.      Social Determinants of Health   Financial Resource Strain:   . Difficulty of Paying Living Expenses:   Food Insecurity:   . Worried About Charity fundraiser in the Last Year:   . Arboriculturist in the Last Year:   Transportation Needs:   . Film/video editor (Medical):   [name] Lack of Transportation (Non-Medical):   Physical Activity:   . Days of Exercise per Week:   . Minutes of Exercise per Session:   Stress:   . Feeling of Stress :   Social Connections:   . Frequency of Communication with Friends  and Family:   . Frequency of Social Gatherings with Friends and Family:   . Attends Religious Services:   . Active Member of Clubs or Organizations:   . Attends Archivist Meetings:   [name] Marital Status:     Family History  Problem Relation Age of Onset  . Diverticulosis Mother   . Ovarian cancer Mother   .  Pancreatic cancer Father   . Lung cancer Father   . Colon polyps Sister   . Colon cancer Neg Hx     Review of Systems     Objective:   Vitals:   05/03/19 0857  BP: (!) 154/90  Pulse: 81  Resp: 16  Temp: 98.9 F (37.2 C)  SpO2: 99%   BP Readings from Last 3 Encounters:  05/03/19 (!) 154/90  04/16/19 (!) 153/86  03/28/19 140/78   Wt Readings from Last 3 Encounters:  05/03/19 168 lb (76.2 kg)  04/26/19 170 lb (77.1 kg)  03/28/19 170 lb (77.1 kg)   Body mass index is 28.84 kg/m.   Physical Exam Constitutional:      General: She is not in acute distress.    Appearance: Normal appearance. She is not ill-appearing.  HENT:     Head: Normocephalic and atraumatic.  Cardiovascular:     Pulses: Normal pulses.  Skin:    General: Skin is warm and dry.     Comments: Puncture where IV was is seen, but is nontender without surrounding erythema-this is in the wrist, the swelling, redness and burning sensation was in the hand-posterior thumb.  This has improved per the patient.  Minimal erythema and tenderness at this time.  No superficial blood clot.  Neurological:     Mental Status: She is alert.     Sensory: No sensory deficit.            Assessment & Plan:    See Problem List for Assessment and Plan of chronic medical problems.     This visit occurred during the SARS-CoV-2 public health emergency.  Safety protocols were in place, including screening questions prior to the visit, additional usage of staff PPE, and extensive cleaning of exam room while observing appropriate contact time as indicated for disinfecting solutions.

## 2019-05-03 ENCOUNTER — Other Ambulatory Visit: Payer: Self-pay

## 2019-05-03 ENCOUNTER — Encounter: Payer: Self-pay | Admitting: Internal Medicine

## 2019-05-03 ENCOUNTER — Ambulatory Visit (INDEPENDENT_AMBULATORY_CARE_PROVIDER_SITE_OTHER): Payer: Medicare Other | Admitting: Internal Medicine

## 2019-05-03 VITALS — BP 154/90 | HR 81 | Temp 98.9°F | Resp 16 | Ht 64.0 in | Wt 168.0 lb

## 2019-05-03 DIAGNOSIS — I10 Essential (primary) hypertension: Secondary | ICD-10-CM | POA: Diagnosis not present

## 2019-05-03 DIAGNOSIS — L03113 Cellulitis of right upper limb: Secondary | ICD-10-CM | POA: Diagnosis not present

## 2019-05-03 DIAGNOSIS — L0390 Cellulitis, unspecified: Secondary | ICD-10-CM | POA: Insufficient documentation

## 2019-05-03 MED ORDER — AMLODIPINE BESYLATE 5 MG PO TABS: 7.5000 mg | ORAL_TABLET | Freq: Every day | ORAL | 1 refills | Status: DC

## 2019-05-03 MED ORDER — CEPHALEXIN 500 MG PO CAPS: 500.0000 mg | ORAL_CAPSULE | Freq: Two times a day (BID) | ORAL | 0 refills | Status: DC

## 2019-05-03 NOTE — Assessment & Plan Note (Signed)
Acute Right hand Improved after amoxicillin she took yesterday-took 1 dose consisting of 2 pills Minimal residual symptoms, but I agree with her we want to make sure this is completely treated Keflex twice daily x3 days No evidence of superficial blood clot, pulses in right hand normal, sensation normal She will call if her symptoms do not completely resolve or recur

## 2019-05-03 NOTE — Assessment & Plan Note (Signed)
Chronic Not ideally controlled Did not tolerate 10 mg of amlodipine, but only took it 1 day We will try increasing amlodipine to 7.5 mg daily Continue other medications Stressed making sure her blood pressure is well controlled Follow-up in a few months

## 2019-05-03 NOTE — Patient Instructions (Signed)
  Medications reviewed and updated.  Changes include :   Increase amlodipine to 7.5 mg daily.   Take keflex the antibiotic for 3 days.    Your prescription(s) have been submitted to your pharmacy. Please take as directed and contact our office if you believe you are having problem(s) with the medication(s).    Please call if there is no improvement in your symptoms.

## 2019-05-14 ENCOUNTER — Encounter (HOSPITAL_COMMUNITY): Payer: Self-pay

## 2019-05-14 ENCOUNTER — Other Ambulatory Visit: Payer: Self-pay

## 2019-05-14 DIAGNOSIS — Z5321 Procedure and treatment not carried out due to patient leaving prior to being seen by health care provider: Secondary | ICD-10-CM | POA: Insufficient documentation

## 2019-05-14 DIAGNOSIS — R52 Pain, unspecified: Secondary | ICD-10-CM | POA: Diagnosis not present

## 2019-05-14 DIAGNOSIS — Z743 Need for continuous supervision: Secondary | ICD-10-CM | POA: Diagnosis not present

## 2019-05-14 DIAGNOSIS — R1032 Left lower quadrant pain: Secondary | ICD-10-CM | POA: Insufficient documentation

## 2019-05-14 NOTE — ED Triage Notes (Signed)
She was sweeping under bead and reached down to pick up dust pan. Has sudden sharp pain to left flank.

## 2019-05-14 NOTE — ED Notes (Signed)
No answer when called to update vital signs x1.

## 2019-05-15 ENCOUNTER — Emergency Department (HOSPITAL_COMMUNITY)
Admission: EM | Admit: 2019-05-15 | Discharge: 2019-05-15 | Disposition: A | Discharge status: Home / Self Care | Payer: Medicare Other | Attending: Emergency Medicine | Admitting: Emergency Medicine

## 2019-05-15 DIAGNOSIS — M1990 Unspecified osteoarthritis, unspecified site: Secondary | ICD-10-CM | POA: Diagnosis not present

## 2019-05-15 DIAGNOSIS — M205X2 Other deformities of toe(s) (acquired), left foot: Secondary | ICD-10-CM | POA: Diagnosis not present

## 2019-05-15 DIAGNOSIS — M792 Neuralgia and neuritis, unspecified: Secondary | ICD-10-CM | POA: Diagnosis not present

## 2019-05-15 DIAGNOSIS — M722 Plantar fascial fibromatosis: Secondary | ICD-10-CM | POA: Diagnosis not present

## 2019-05-15 NOTE — ED Notes (Signed)
No answer x2 

## 2019-05-18 ENCOUNTER — Other Ambulatory Visit: Payer: Self-pay | Admitting: Internal Medicine

## 2019-06-06 ENCOUNTER — Ambulatory Visit: Payer: Medicare Other | Admitting: Family Medicine

## 2019-06-07 ENCOUNTER — Ambulatory Visit (INDEPENDENT_AMBULATORY_CARE_PROVIDER_SITE_OTHER)
Admission: RE | Admit: 2019-06-07 | Discharge: 2019-06-07 | Disposition: A | Discharge status: Home / Self Care | Payer: Medicare Other | Source: Ambulatory Visit | Attending: Family Medicine | Admitting: Family Medicine

## 2019-06-07 ENCOUNTER — Other Ambulatory Visit: Payer: Self-pay

## 2019-06-07 ENCOUNTER — Ambulatory Visit: Payer: Medicare Other | Admitting: Family Medicine

## 2019-06-07 VITALS — BP 112/72 | HR 100 | Ht 64.0 in | Wt 167.0 lb

## 2019-06-07 DIAGNOSIS — M1712 Unilateral primary osteoarthritis, left knee: Secondary | ICD-10-CM | POA: Diagnosis not present

## 2019-06-07 DIAGNOSIS — M25462 Effusion, left knee: Secondary | ICD-10-CM | POA: Diagnosis not present

## 2019-06-07 DIAGNOSIS — M545 Low back pain, unspecified: Secondary | ICD-10-CM

## 2019-06-07 DIAGNOSIS — M25562 Pain in left knee: Secondary | ICD-10-CM

## 2019-06-07 MED ORDER — GABAPENTIN 400 MG PO CAPS: 400.0000 mg | ORAL_CAPSULE | Freq: Every day | ORAL | 3 refills | Status: DC

## 2019-06-07 MED ORDER — VITAMIN D (ERGOCALCIFEROL) 1.25 MG (50000 UNIT) PO CAPS
50000.0000 [IU] | ORAL_CAPSULE | ORAL | 0 refills | Status: DC
Start: 2019-06-07 — End: 2020-02-28

## 2019-06-07 NOTE — Progress Notes (Signed)
[place] Phone: [phone] Subjective:    I'm seeing this patient by the request  of:  [name], MD  CC:   [id number]   09/26/2018 Repeat injection given today.  I do believe it is more secondary to compensation for the knee.  Discussed posture and ergonomics, we discussed hip abductor strengthening, patient is now caring for her grandchild which she thinks is causing more repetitive motions.  Repeat injection given today.  Tolerated the procedure well.  Discussed icing regimen and home exercises, which activities to do which wants to avoid.  Patient is to increase activity follow-up again in 4 to 8 weeks patient could be a candidate for Visco supplementation otherwise we will continue to, as needed  Update 06/07/2019 Kristina Ayala is a 74 y.o. female coming in with complaint of left knee and left hip pain. Patient states she fell 6 weeks ago on left knee. Pain over anterior aspect that can radiate into the posterior-medial portion of her knee.   Also having constant lower back pain. Pain increases with standing and forward bending. Using Meloxicam, gabapentin.   Was told she has plantar fasciitis on right foot 3 weeks ago by podiatrist.   Wants to know if collagen peptides would help her.     Past Medical History:  Diagnosis Date  . Arthritis   . Bursitis    left elbow  . Clostridium difficile infection   . Frequent falls   . HLD (hyperlipidemia)   . Hypertension   . Infectious colitis    Past Surgical History:  Procedure Laterality Date  . CATARACT EXTRACTION Bilateral   . COLONOSCOPY    . ORIF PROXIMAL TIBIAL PLATEAU FRACTURE Left 02/19/2012   knee  . POLYPECTOMY    . TONSILLECTOMY     Social History   Socioeconomic History  . Marital status: Divorced    Spouse name: Not on file  . Number of children: 2  . Years of education: Not on file  . Highest education level: Not on  file  Occupational History  . Occupation: RETIRED  Tobacco Use  . Smoking status: Former Smoker    Packs/day: 0.25    Years: 50.00    Pack years: 12.50    Types: Cigarettes    Quit date: 08/28/2014    Years since quitting: 4.7  . Smokeless tobacco: Never Used  . Tobacco comment: 1 pack every 3 days  Substance and Sexual Activity  . Alcohol use: No    Alcohol/week: 0.0 standard drinks  . Drug use: No  . Sexual activity: Not Currently  Other Topics Concern  . Not on file  Social History Narrative   Lives alone has 2 supportive children.      Social Determinants of Health   Financial Resource Strain:   . Difficulty of Paying Living Expenses:   Food Insecurity:   . Worried About Charity fundraiser in the Last Year:   . Arboriculturist in the Last Year:   Transportation Needs:   . Film/video editor (Medical):   [name] Lack of Transportation (Non-Medical):   Physical Activity:   . Days of Exercise per Week:   . Minutes of Exercise per Session:   Stress:   . Feeling of Stress :   Social Connections:   . Frequency of Communication with Friends and Family:   . Frequency of Social Gatherings with Friends and Family:   .  Attends Religious Services:   . Active Member of Clubs or Organizations:   . Attends Archivist Meetings:   [name] Marital Status:    Allergies  Allergen Reactions  . Doxycycline Diarrhea  . Minocycline Diarrhea  . Wellbutrin [Bupropion] Swelling    Per pt her tongue was swollen and sore/symptoms stopped once the medication was discontinued.   [name] Penicillins Rash    Has patient had a PCN reaction causing immediate rash, facial/tongue/throat swelling, SOB or lightheadedness with hypotension: yes Has patient had a PCN reaction causing severe rash involving mucus membranes or skin necrosis: no Has patient had a PCN reaction that required hospitalization: unknown Has patient had a PCN reaction occurring within the last 10 years: no If all of the above  answers are "NO", then may proceed with Cephalosporin use.    Family History  Problem Relation Age of Onset  . Diverticulosis Mother   . Ovarian cancer Mother   . Pancreatic cancer Father   . Lung cancer Father   . Colon polyps Sister   . Colon cancer Neg Hx     Current Outpatient Medications (Endocrine & Metabolic):  .  denosumab (PROLIA) 60 MG/ML SOSY injection, INJECT 60 MG INTO THE SKIN ONCE FOR 1 DOSE  Current Outpatient Medications (Cardiovascular):  .  amLODipine (NORVASC) 5 MG tablet, Take 1.5 tablets (7.5 mg total) by mouth daily. [name]  atorvastatin (LIPITOR) 40 MG tablet, TAKE 1 TABLET BY MOUTH EVERY MORNING. NEED OFFICE VISIT .  hydrochlorothiazide (HYDRODIURIL) 25 MG tablet, TAKE 1 TABLET(25 MG) BY MOUTH EVERY MORNING .  lisinopril (ZESTRIL) 40 MG tablet, TAKE 1 TABLET(40 MG) BY MOUTH DAILY   Current Outpatient Medications (Analgesics):  .  aspirin EC 81 MG tablet, Take 81 mg by mouth daily. .  meloxicam (MOBIC) 7.5 MG tablet, Take 1 tablet (7.5 mg total) by mouth daily.  Current Outpatient Medications (Hematological):  [name]  Cyanocobalamin (VITAMIN B-12) 2500 MCG SUBL, Place 1 tablet under the tongue daily. .  ferrous sulfate 325 (65 FE) MG tablet, Take 1 tablet (325 mg total) by mouth daily with breakfast.  Current Outpatient Medications (Other):  [name]  ALPRAZolam (XANAX) 0.5 MG tablet, TAKE 1 TABLET(0.5 MG) BY MOUTH AT BEDTIME AS NEEDED FOR ANXIETY .  cephALEXin (KEFLEX) 500 MG capsule, Take 1 capsule (500 mg total) by mouth 2 (two) times daily. .  Cholecalciferol (VITAMIN D3) 10000 units TABS, Take 2 tablets by mouth.  .  Magnesium 100 MG CAPS, Take 100 mg by mouth daily. .  potassium chloride (KLOR-CON) 8 MEQ tablet, TAKE 2 TABLETS(16 MEQ) BY MOUTH DAILY .  tiZANidine (ZANAFLEX) 4 MG tablet, Take 1 tablet (4 mg total) by mouth daily as needed for muscle spasms. .  TURMERIC PO, Take 1 tablet by mouth daily. [name]  gabapentin (NEURONTIN) 400 MG capsule, Take 1 capsule (400 mg  total) by mouth at bedtime. .  Vitamin D, Ergocalciferol, (DRISDOL) 1.25 MG (50000 UNIT) CAPS capsule, Take 1 capsule (50,000 Units total) by mouth every 7 (seven) days.   Reviewed prior external information including notes and imaging from  primary care provider As well as notes that were available from care everywhere and other healthcare systems.  Past medical history, social, surgical and family history all reviewed in electronic medical record.  No pertanent information unless stated regarding to the chief complaint.   Review of Systems:  No headache, visual changes, nausea, vomiting, diarrhea, constipation, dizziness, abdominal pain, skin rash, fevers, chills, night sweats, weight loss, swollen  lymph nodes,  joint swelling, chest pain, shortness of breath, mood changes. POSITIVE muscle aches, body aches  Objective  Blood pressure 112/72, pulse 100, height 5\' 4"  (1.626 m), weight 167 lb (75.8 kg), SpO2 98 %.   General: No apparent distress alert and oriented x3 mood and affect normal, dressed appropriately.  HEENT: Pupils equal, extraocular movements intact  Respiratory: Patient's speak in full sentences and does not appear short of breath  Cardiovascular: No lower extremity edema, non tender, no erythema  Gait antalgic gait Multiple tender points noted in the musculature around her body even to light palpation. Knee: Left valgus deformity noted.  Abnormal thigh to calf ratio.  Resolving abrasions noted from the fairly recent fall Tender to palpation over medial and PF joint line.  ROM full in flexion and extension and lower leg rotation. instability with valgus force.  painful patellar compression. Patellar glide with moderate crepitus. Patellar and quadriceps tendons unremarkable. Hamstring and quadriceps strength is normal.  Back exam diffuse tenderness noted to even light palpation.  Tightness noted.  More pain over the sacroiliac joint than the contralateral side.  Mild  radicular symptoms in the buttocks area with straight leg test on the left side.  Neurovascularly intact distally.   Impression and Recommendations:     This case required medical decision making of moderate complexity. The above documentation has been reviewed and is accurate and complete Lyndal Pulley, DO       Note: This dictation was prepared with Dragon dictation along with smaller phrase technology. Any transcriptional errors that result from this process are unintentional.

## 2019-06-07 NOTE — Patient Instructions (Addendum)
Good to see you  PT [facility] and ask about the TENS unit Ice 20 minutes 2 times daily. Usually after activity and before bed. Arnica lotion to the knee 2 times a day  pennsaid pinkie amount topically 2 times daily as needed.   Stay active  Increased your gabapentin to 400mg  at night and at your pharmacy  Xray of your knee at our [place] office.  See me again in 4-6 weeks

## 2019-06-08 ENCOUNTER — Encounter: Payer: Self-pay | Admitting: Family Medicine

## 2019-06-08 NOTE — Assessment & Plan Note (Signed)
Chronic low back pain.  X-rays ordered today.  Known arthritic changes.  Encouraged to monitor weight loss.  Patient has had an MRI previously with an L5 nerve root impingement and has responded well to epidurals previously.  Was multiple years ago and will repeat x-rays.  Discussed different medications again.  Increase gabapentin to 400 mg at night.  Zanaflex for breakthrough.

## 2019-06-08 NOTE — Assessment & Plan Note (Signed)
No degenerative changes of the left knee.  Discussed the potential for injection.  We will get x-rays to further evaluate for any type of fracture secondary to fall.  Discussed icing regimen.  Topical anti-inflammatories given.  Home exercises given.  Meloxicam for breakthrough.  Could consider the possibility of injections again.  Follow-up again in 4 to 6 weeks

## 2019-06-11 ENCOUNTER — Telehealth: Payer: Self-pay | Admitting: Family Medicine

## 2019-06-11 ENCOUNTER — Other Ambulatory Visit: Payer: Self-pay

## 2019-06-11 DIAGNOSIS — M545 Low back pain, unspecified: Secondary | ICD-10-CM

## 2019-06-11 NOTE — Telephone Encounter (Signed)
Spoke with patient who will go to [facility] to get xray tomorrow morning.

## 2019-06-11 NOTE — Telephone Encounter (Signed)
Patient called stating that she fell on Friday and it has flared up her back pain. She is now having extreme pain in her lower back and asked if Dr Tamala Julian would be willing to order another xray for her to make sure everything is okay (or would you prefer for her to schedule a visit to be seen?).  Please advise.

## 2019-06-12 ENCOUNTER — Other Ambulatory Visit: Payer: Self-pay

## 2019-06-12 ENCOUNTER — Ambulatory Visit (INDEPENDENT_AMBULATORY_CARE_PROVIDER_SITE_OTHER)
Admission: RE | Admit: 2019-06-12 | Discharge: 2019-06-12 | Disposition: A | Discharge status: Home / Self Care | Payer: Medicare Other | Source: Ambulatory Visit | Attending: Family Medicine | Admitting: Family Medicine

## 2019-06-12 DIAGNOSIS — M545 Low back pain, unspecified: Secondary | ICD-10-CM

## 2019-06-15 NOTE — Telephone Encounter (Signed)
Patient called to follow up on xray results.

## 2019-06-18 ENCOUNTER — Telehealth: Payer: Self-pay

## 2019-06-18 ENCOUNTER — Other Ambulatory Visit: Payer: Self-pay

## 2019-06-18 DIAGNOSIS — M5442 Lumbago with sciatica, left side: Secondary | ICD-10-CM

## 2019-06-18 NOTE — Telephone Encounter (Signed)
Left message for patient regarding results/

## 2019-06-19 ENCOUNTER — Other Ambulatory Visit: Payer: Self-pay

## 2019-06-19 DIAGNOSIS — M5442 Lumbago with sciatica, left side: Secondary | ICD-10-CM

## 2019-06-19 DIAGNOSIS — M5416 Radiculopathy, lumbar region: Secondary | ICD-10-CM

## 2019-06-19 NOTE — Telephone Encounter (Signed)
Left message for patient that epidural has been ordered and to call us to follow up once she has injection date.

## 2019-06-20 DIAGNOSIS — L821 Other seborrheic keratosis: Secondary | ICD-10-CM | POA: Diagnosis not present

## 2019-06-20 DIAGNOSIS — L820 Inflamed seborrheic keratosis: Secondary | ICD-10-CM | POA: Diagnosis not present

## 2019-06-20 DIAGNOSIS — D3612 Benign neoplasm of peripheral nerves and autonomic nervous system, upper limb, including shoulder: Secondary | ICD-10-CM | POA: Diagnosis not present

## 2019-06-20 DIAGNOSIS — L853 Xerosis cutis: Secondary | ICD-10-CM | POA: Diagnosis not present

## 2019-06-22 ENCOUNTER — Other Ambulatory Visit: Payer: Self-pay

## 2019-06-22 ENCOUNTER — Ambulatory Visit
Admission: RE | Admit: 2019-06-22 | Discharge: 2019-06-22 | Disposition: A | Discharge status: Home / Self Care | Payer: Medicare Other | Source: Ambulatory Visit | Attending: Family Medicine | Admitting: Family Medicine

## 2019-06-22 DIAGNOSIS — M5416 Radiculopathy, lumbar region: Secondary | ICD-10-CM

## 2019-06-22 DIAGNOSIS — M545 Low back pain: Secondary | ICD-10-CM | POA: Diagnosis not present

## 2019-06-22 MED ORDER — IOPAMIDOL (ISOVUE-M 200) INJECTION 41%
1.0000 mL | Freq: Once | INTRAMUSCULAR | Status: AC
  Administered 2019-06-22: 1 mL via EPIDURAL

## 2019-06-22 MED ORDER — METHYLPREDNISOLONE ACETATE 40 MG/ML INJ SUSP (RADIOLOG
120.0000 mg | Freq: Once | INTRAMUSCULAR | Status: AC
  Administered 2019-06-22: 120 mg via EPIDURAL

## 2019-06-22 NOTE — Discharge Instructions (Signed)

## 2019-06-27 ENCOUNTER — Other Ambulatory Visit: Payer: Self-pay | Admitting: Internal Medicine

## 2019-06-27 DIAGNOSIS — Z1231 Encounter for screening mammogram for malignant neoplasm of breast: Secondary | ICD-10-CM

## 2019-07-03 ENCOUNTER — Encounter: Payer: Self-pay | Admitting: Physical Therapy

## 2019-07-03 ENCOUNTER — Other Ambulatory Visit: Payer: Self-pay

## 2019-07-03 ENCOUNTER — Ambulatory Visit: Payer: Medicare Other | Attending: Family Medicine | Admitting: Physical Therapy

## 2019-07-03 DIAGNOSIS — M6281 Muscle weakness (generalized): Secondary | ICD-10-CM | POA: Diagnosis not present

## 2019-07-03 DIAGNOSIS — R252 Cramp and spasm: Secondary | ICD-10-CM | POA: Insufficient documentation

## 2019-07-03 DIAGNOSIS — M5442 Lumbago with sciatica, left side: Secondary | ICD-10-CM | POA: Diagnosis not present

## 2019-07-03 DIAGNOSIS — M25562 Pain in left knee: Secondary | ICD-10-CM | POA: Diagnosis not present

## 2019-07-03 NOTE — Therapy (Signed)
[place], [place], [address] Phone: [phone]   Fax:  [phone]  Physical Therapy Evaluation  Patient Details  Name: Kristina Ayala MRN: [id number] Date of Birth: 1946-01-14 Referring Provider (PT): Creig Hines   Encounter Date: 07/03/2019  PT End of Session - 07/03/19 0945    Visit Number  1    Date for PT Re-Evaluation  09/02/19    PT Start Time  0845    PT Stop Time  0930    PT Time Calculation (min)  45 min    Activity Tolerance  Patient tolerated treatment well    Behavior During Therapy  [facility] for tasks assessed/performed       Past Medical History:  Diagnosis Date  . Arthritis   . Bursitis    left elbow  . Clostridium difficile infection   . Frequent falls   . HLD (hyperlipidemia)   . Hypertension   . Infectious colitis     Past Surgical History:  Procedure Laterality Date  . CATARACT EXTRACTION Bilateral   . COLONOSCOPY    . ORIF PROXIMAL TIBIAL PLATEAU FRACTURE Left 02/19/2012   knee  . POLYPECTOMY    . TONSILLECTOMY      There were no vitals filed for this visit.   Subjective Assessment - 07/03/19 0844    Subjective  Pt reports that she is having LBP and L knee pain increasing in the past year. Pt reports a fall about 1 month ago and fell on her L knee; some increasing pain since then. Pt reports that she is wanting to get a TENs unit to alleviate back pain. Pt states she experiences flare ups of back pain and knee pain every few months or so and the fall restarted a flare up. Pt reports daughter and granddaughter are living with her and she is having difficulty doing the housework.    Pertinent History  HTN    Limitations  Lifting;House hold activities;Walking;Sitting    Patient Stated Goals  have less pain and no falls    Currently in Pain?  No/denies    Pain Score  0-No pain    Pain Location  Back    Pain Orientation  Lower    Pain Radiating Towards  was  having pain radiating down the back of LLE until she received epidural    Multiple Pain Sites  Yes    Pain Score  0    Pain Location  Knee    Pain Orientation  Left         [facility] PT Assessment - 07/03/19 0001      Assessment   Medical Diagnosis  LBP and L knee pain    Referring Provider (PT)  Z. Smith    Prior Therapy  PT for LBP      Precautions   Precautions  Fall      Restrictions   Weight Bearing Restrictions  No      Balance Screen   Has the patient fallen in the past 6 months  Yes    How many times?  1    Has the patient had a decrease in activity level because of a fear of falling?   Yes    Is the patient reluctant to leave their home because of a fear of falling?   Yes      Home Environment   Additional Comments  has stairs but has a lift, does some housework  Prior Function   Level of Independence  Independent    Vocation  Retired    Leisure  no exercise      Media planner Intact      ROM / Strength   AROM / PROM / Strength  AROM;Strength      AROM   Overall AROM Comments  Lumbar ROM 50% limited but not painful      Strength   Overall Strength Comments  4-/5 LE strength      Flexibility   Soft Tissue Assessment /Muscle Length  yes    Hamstrings  tight    Piriformis  tight      Palpation   Palpation comment  tender to palpation around joint line L knee and with pressure on L patella      Transfers   Five time sit to stand comments   unable to perform; secondary to fatigue      Berg Balance Test   Sit to Stand  Able to stand  independently using hands    Standing Unsupported  Able to stand safely 2 minutes    Sitting with Back Unsupported but Feet Supported on Floor or Stool  Able to sit safely and securely 2 minutes    Stand to Sit  Controls descent by using hands    Transfers  Able to transfer safely, definite need of hands    Standing Unsupported with Eyes Closed  Able to stand 10 seconds with supervision    Standing  Unsupported with Feet Together  Able to place feet together independently and stand for 1 minute with supervision    From Standing, Reach Forward with Outstretched Arm  Can reach forward >12 cm safely (5")    From Standing Position, Pick up Object from Floor  Able to pick up shoe, needs supervision    From Standing Position, Turn to Look Behind Over each Shoulder  Needs supervision when turning    Turn 360 Degrees  Able to turn 360 degrees safely but slowly    Standing Unsupported, Alternately Place Feet on Step/Stool  Able to complete >2 steps/needs minimal assist    Standing Unsupported, One Foot in Front  Able to take small step independently and hold 30 seconds    Standing on One Leg  Tries to lift leg/unable to hold 3 seconds but remains standing independently    Total Score  36                  Objective measurements completed on examination: See above findings.      [facility] Adult PT Treatment/Exercise - 07/03/19 0001      Lumbar Exercises: Seated   Other Seated Lumbar Exercises  seated forward/lateral flexion lumbar stretch, thoracic extension stretch over chair, mid back stretch seated, hamstring stretch, piriformis stretch      Moist Heat Therapy   Number Minutes Moist Heat  15 Minutes    Moist Heat Location  Lumbar Spine      Electrical Stimulation   Electrical Stimulation Location  low back    Electrical Stimulation Action  IFC    Electrical Stimulation Parameters  supine    Electrical Stimulation Goals  Pain             PT Education - 07/03/19 0945    Education Details  Pt educated on POC and HEP    Person(s) Educated  Patient    Methods  Explanation;Demonstration;Handout    Comprehension  Verbalized understanding;Returned demonstration  PT Short Term Goals - 07/03/19 1101      PT SHORT TERM GOAL #1   Title  be independent in initial HEP    Time  2    Period  Weeks    Status  New    Target Date  07/17/19        PT Long Term Goals -  07/03/19 1101      PT LONG TERM GOAL #1   Title  be independent in advanced HEP    Baseline  transition to gym    Time  6    Period  Weeks    Status  New    Target Date  08/14/19      PT LONG TERM GOAL #2   Title  Pt will demonstrate 5x STS <15 seconds to decrease risk for falls    Time  6    Period  Weeks    Status  New    Target Date  08/14/19      PT LONG TERM GOAL #3   Title  increase Berg balance test to 46/56    Baseline  36    Time  6    Period  Weeks    Status  New    Target Date  08/14/19             Plan - 07/03/19 0945    Clinical Impression Statement  Pt presents to clinic with reports of longstanding LBP and L knee pain which has worsened following a fall onto L knee about ~1 month ago. Focused session today on lumbar flexibility stretches d/t pt reporting she will be taking a long train ride to [facility] tomorrow and needs some LBP relief. Pt demonstrates limited lumbar ROM, LE flexibility deficits, functional LE strength deficits, inability to perform 5 STS, and balance deficits on the Berg indicating a high fall risk. Pt received epidural injection for LB and reports that helped resolve the radiating pain in LLE. Pt would benefit from skilled PT to address the above impairments.    Personal Factors and Comorbidities  Age;Past/Current Experience;Comorbidity 2    Comorbidities  HTN, osteoporosis    Examination-Activity Limitations  Bend;Lift;Stand;Stairs;Squat;Locomotion Level;Transfers    Examination-Participation Restrictions  Community Activity;Interpersonal Relationship    Stability/Clinical Decision Making  Stable/Uncomplicated    Clinical Decision Making  Low    Rehab Potential  Good    PT Frequency  2x / week    PT Duration  6 weeks    PT Treatment/Interventions  ADLs/Self Care Home Management;Cryotherapy;Electrical Stimulation;Gait training;Neuromuscular re-education;Balance training;Therapeutic exercise;Therapeutic activities;Moist Heat;Stair  training;Functional mobility training;Patient/family education;Manual techniques;Iontophoresis 4mg /ml Dexamethasone;Ultrasound;Passive range of motion;Taping    PT Next Visit Plan  Balance ex's, LE strengthening/flexibility ex's, modalities/manual as indicated    Consulted and Agree with Plan of Care  Patient       Patient will benefit from skilled therapeutic intervention in order to improve the following deficits and impairments:  Abnormal gait, Decreased range of motion, Difficulty walking, Increased muscle spasms, Decreased activity tolerance, Pain, Decreased balance, Impaired flexibility, Improper body mechanics, Decreased mobility, Decreased strength  Visit Diagnosis: Muscle weakness (generalized)  Acute left-sided low back pain with left-sided sciatica  Cramp and spasm  Acute pain of left knee     Problem List Patient Active Problem List   Diagnosis Date Noted  . Cellulitis 05/03/2019  . DOE (dyspnea on exertion) 03/20/2019  . Chronic left SI joint pain 03/13/2019  . Leg cramping 01/10/2019  . Osteoarthritis 01/10/2019  .  Right lower quadrant abdominal pain 11/09/2018  . Adnexal mass, right 11/09/2018  . Change in stool 10/18/2018  . Lung nodules 06/19/2018  . Abnormal findings on diagnostic imaging of lung 06/08/2018  . Thyromegaly 10/28/2017  . Restless leg syndrome 10/12/2017  . Greater trochanteric bursitis of left hip 07/26/2017  . Hypokalemia 04/18/2017  . Degenerative arthritis of left knee 12/01/2016  . Anxiety 12/01/2016  . Prediabetes 09/16/2016  . Bursitis of right shoulder 09/04/2015  . Lateral meniscus derangement 06/03/2015  . Tear of LCL (lateral collateral ligament) of knee 06/03/2015  . Lumbar radiculopathy 09/19/2014  . Low back pain 01/04/2014  . Bilateral knee pain 01/04/2014  . Diverticulosis of colon without hemorrhage 07/26/2013  . Trigger thumb of left hand 03/06/2013  . Essential hypertension, benign 02/15/2013  . Hyperlipidemia  09/28/2012  . COPD (chronic obstructive pulmonary disease) ([place]) 05/22/2012  . Osteoporosis 05/22/2012   Amador Cunas, PT, DPT [facility] [place] 07/03/2019, 11:03 AM  [place] [address] [place], [place], [address] Phone: [phone]   Fax:  [phone]  Name: Kristina Ayala MRN: [id number] Date of Birth: 03-25-1945

## 2019-07-03 NOTE — Patient Instructions (Signed)
Access Code: [email or web address] URL: [email or web address] Date: 07/03/2019 Prepared by: Amador Cunas  Exercises Seated Flexion Stretch with Swiss Ball - 1 x daily - 7 x weekly - 10 reps - 3 sets Seated Piriformis Stretch with Trunk Bend - 1 x daily - 7 x weekly - 10 reps - 3 sets Seated Hamstring Stretch - 1 x daily - 7 x weekly - 10 reps - 3 sets Seated Thoracic Lumbar Extension with Pectoralis Stretch - 1 x daily - 7 x weekly - 10 reps - 3 sets Seated Mid Back Stretch - 1 x daily - 7 x weekly - 10 reps - 3 sets

## 2019-07-04 ENCOUNTER — Ambulatory Visit: Payer: Medicare Other | Admitting: Physical Therapy

## 2019-07-10 NOTE — Patient Instructions (Addendum)
  Blood work was ordered.     Medications reviewed and updated.  Changes include :   none  Your prescription(s) have been submitted to your pharmacy. Please take as directed and contact our office if you believe you are having problem(s) with the medication(s).   Please followup in 6 months   

## 2019-07-10 NOTE — Progress Notes (Signed)
Subjective:    Patient ID: Kristina Ayala, female    DOB: Jun 30, 1945, 74 y.o.   MRN: [id number]  HPI The patient is here for follow up of their chronic medical problems, including htn, hyperlipidemia, prediabetes, anxiety, OP, OA, cramping in legs at night.   She is not exercising regularly.       Medications and allergies reviewed with patient and updated if appropriate.  Patient Active Problem List   Diagnosis Date Noted  . Cellulitis 05/03/2019  . DOE (dyspnea on exertion) 03/20/2019  . Chronic left SI joint pain 03/13/2019  . Leg cramping 01/10/2019  . Osteoarthritis 01/10/2019  . Right lower quadrant abdominal pain 11/09/2018  . Adnexal mass, right 11/09/2018  . Change in stool 10/18/2018  . Lung nodules 06/19/2018  . Abnormal findings on diagnostic imaging of lung 06/08/2018  . Thyromegaly 10/28/2017  . Restless leg syndrome 10/12/2017  . Greater trochanteric bursitis of left hip 07/26/2017  . Hypokalemia 04/18/2017  . Degenerative arthritis of left knee 12/01/2016  . Anxiety 12/01/2016  . Prediabetes 09/16/2016  . Bursitis of right shoulder 09/04/2015  . Lateral meniscus derangement 06/03/2015  . Tear of LCL (lateral collateral ligament) of knee 06/03/2015  . Lumbar radiculopathy 09/19/2014  . Low back pain 01/04/2014  . Bilateral knee pain 01/04/2014  . Diverticulosis of colon without hemorrhage 07/26/2013  . Trigger thumb of left hand 03/06/2013  . Essential hypertension, benign 02/15/2013  . Hyperlipidemia 09/28/2012  . COPD (chronic obstructive pulmonary disease) ([place]) 05/22/2012  . Osteoporosis 05/22/2012    Current Outpatient Medications on File Prior to Visit  Medication Sig Dispense Refill  . ALPRAZolam (XANAX) 0.5 MG tablet TAKE 1 TABLET(0.5 MG) BY MOUTH AT BEDTIME AS NEEDED FOR ANXIETY 30 tablet 0  . amLODipine (NORVASC) 5 MG tablet Take 1.5 tablets (7.5 mg total) by mouth daily. 135 tablet 1  . aspirin EC 81 MG tablet Take 81 mg by mouth  daily.    [name] atorvastatin (LIPITOR) 40 MG tablet TAKE 1 TABLET BY MOUTH EVERY MORNING. NEED OFFICE VISIT 90 tablet 0  . Cholecalciferol (VITAMIN D3) 10000 units TABS Take 1 tablet by mouth once a week.     . Cyanocobalamin (VITAMIN B-12) 2500 MCG SUBL Place 1 tablet under the tongue daily.    [name] denosumab (PROLIA) 60 MG/ML SOSY injection INJECT 60 MG INTO THE SKIN ONCE FOR 1 DOSE 1 mL 0  . ferrous sulfate 325 (65 FE) MG tablet Take 1 tablet (325 mg total) by mouth daily with breakfast. 30 tablet 3  . gabapentin (NEURONTIN) 400 MG capsule Take 1 capsule (400 mg total) by mouth at bedtime. 30 capsule 3  . hydrochlorothiazide (HYDRODIURIL) 25 MG tablet TAKE 1 TABLET(25 MG) BY MOUTH EVERY MORNING 90 tablet 3  . lisinopril (ZESTRIL) 40 MG tablet TAKE 1 TABLET(40 MG) BY MOUTH DAILY 90 tablet 1  . Magnesium 100 MG CAPS Take 100 mg by mouth daily.    . meloxicam (MOBIC) 7.5 MG tablet Take 1 tablet (7.5 mg total) by mouth daily. 30 tablet 5  . potassium chloride (KLOR-CON) 8 MEQ tablet TAKE 2 TABLETS(16 MEQ) BY MOUTH DAILY 180 tablet 1  . tiZANidine (ZANAFLEX) 4 MG tablet Take 1 tablet (4 mg total) by mouth daily as needed for muscle spasms. 30 tablet 2  . TURMERIC PO Take 1 tablet by mouth daily.    . Vitamin D, Ergocalciferol, (DRISDOL) 1.25 MG (50000 UNIT) CAPS capsule Take 1 capsule (50,000 Units total) by mouth every  7 (seven) days. 12 capsule 0   No current facility-administered medications on file prior to visit.    Past Medical History:  Diagnosis Date  . Arthritis   . Bursitis    left elbow  . Clostridium difficile infection   . Frequent falls   . HLD (hyperlipidemia)   . Hypertension   . Infectious colitis     Past Surgical History:  Procedure Laterality Date  . CATARACT EXTRACTION Bilateral   . COLONOSCOPY    . ORIF PROXIMAL TIBIAL PLATEAU FRACTURE Left 02/19/2012   knee  . POLYPECTOMY    . TONSILLECTOMY      Social History   Socioeconomic History  . Marital status:  Divorced    Spouse name: Not on file  . Number of children: 2  . Years of education: Not on file  . Highest education level: Not on file  Occupational History  . Occupation: RETIRED  Tobacco Use  . Smoking status: Former Smoker    Packs/day: 0.25    Years: 50.00    Pack years: 12.50    Types: Cigarettes    Quit date: 08/28/2014    Years since quitting: 4.8  . Smokeless tobacco: Never Used  . Tobacco comment: 1 pack every 3 days  Vaping Use  . Vaping Use: Never used  Substance and Sexual Activity  . Alcohol use: No    Alcohol/week: 0.0 standard drinks  . Drug use: No  . Sexual activity: Not Currently  Other Topics Concern  . Not on file  Social History Narrative   Lives alone has 2 supportive children.      Social Determinants of Health   Financial Resource Strain:   . Difficulty of Paying Living Expenses:   Food Insecurity:   . Worried About Charity fundraiser in the Last Year:   . Arboriculturist in the Last Year:   Transportation Needs:   . Film/video editor (Medical):   [name] Lack of Transportation (Non-Medical):   Physical Activity:   . Days of Exercise per Week:   . Minutes of Exercise per Session:   Stress:   . Feeling of Stress :   Social Connections:   . Frequency of Communication with Friends and Family:   . Frequency of Social Gatherings with Friends and Family:   . Attends Religious Services:   . Active Member of Clubs or Organizations:   . Attends Archivist Meetings:   [name] Marital Status:     Family History  Problem Relation Age of Onset  . Diverticulosis Mother   . Ovarian cancer Mother   . Pancreatic cancer Father   . Lung cancer Father   . Colon polyps Sister   . Colon cancer Neg Hx     Review of Systems  Constitutional: Negative for chills and fever.  Respiratory: Positive for shortness of breath (only in hot, humid weather). Negative for cough and wheezing.   Cardiovascular: Negative for chest pain, palpitations and leg  swelling.  Neurological: Positive for headaches (occ). Negative for light-headedness.       Objective:   Vitals:   07/11/19 0932  BP: 128/74  Pulse: 73  Temp: 98.4 F (36.9 C)  SpO2: 98%   BP Readings from Last 3 Encounters:  07/11/19 128/74  06/22/19 133/83  06/07/19 112/72   Wt Readings from Last 3 Encounters:  07/11/19 169 lb (76.7 kg)  06/07/19 167 lb (75.8 kg)  05/03/19 168 lb (76.2 kg)   Body mass  index is 29.01 kg/m.   Physical Exam    Constitutional: Appears well-developed and well-nourished. No distress.  HENT:  Head: Normocephalic and atraumatic.  Neck: Neck supple. No tracheal deviation present. No thyromegaly present.  No cervical lymphadenopathy Cardiovascular: Normal rate, regular rhythm and normal heart sounds.   No murmur heard. No carotid bruit .  No edema Pulmonary/Chest: Effort normal and breath sounds normal. No respiratory distress. No has no wheezes. No rales.  Skin: Skin is warm and dry. Not diaphoretic.  Psychiatric: Normal mood and affect. Behavior is normal.      Assessment & Plan:    See Problem List for Assessment and Plan of chronic medical problems.    This visit occurred during the SARS-CoV-2 public health emergency.  Safety protocols were in place, including screening questions prior to the visit, additional usage of staff PPE, and extensive cleaning of exam room while observing appropriate contact time as indicated for disinfecting solutions.

## 2019-07-11 ENCOUNTER — Telehealth: Payer: Self-pay

## 2019-07-11 ENCOUNTER — Ambulatory Visit (INDEPENDENT_AMBULATORY_CARE_PROVIDER_SITE_OTHER): Payer: Medicare Other | Admitting: Internal Medicine

## 2019-07-11 ENCOUNTER — Encounter: Payer: Self-pay | Admitting: Internal Medicine

## 2019-07-11 ENCOUNTER — Other Ambulatory Visit: Payer: Self-pay | Admitting: Internal Medicine

## 2019-07-11 ENCOUNTER — Other Ambulatory Visit: Payer: Self-pay

## 2019-07-11 VITALS — BP 128/74 | HR 73 | Temp 98.4°F | Ht 64.0 in | Wt 169.0 lb

## 2019-07-11 DIAGNOSIS — M810 Age-related osteoporosis without current pathological fracture: Secondary | ICD-10-CM | POA: Diagnosis not present

## 2019-07-11 DIAGNOSIS — R252 Cramp and spasm: Secondary | ICD-10-CM

## 2019-07-11 DIAGNOSIS — R7303 Prediabetes: Secondary | ICD-10-CM | POA: Diagnosis not present

## 2019-07-11 DIAGNOSIS — F419 Anxiety disorder, unspecified: Secondary | ICD-10-CM

## 2019-07-11 DIAGNOSIS — E782 Mixed hyperlipidemia: Secondary | ICD-10-CM | POA: Diagnosis not present

## 2019-07-11 DIAGNOSIS — I10 Essential (primary) hypertension: Secondary | ICD-10-CM | POA: Diagnosis not present

## 2019-07-11 DIAGNOSIS — M159 Polyosteoarthritis, unspecified: Secondary | ICD-10-CM

## 2019-07-11 DIAGNOSIS — M8949 Other hypertrophic osteoarthropathy, multiple sites: Secondary | ICD-10-CM

## 2019-07-11 DIAGNOSIS — E876 Hypokalemia: Secondary | ICD-10-CM

## 2019-07-11 LAB — CBC WITH DIFFERENTIAL/PLATELET
Basophils Absolute: 0 10*3/uL (ref 0.0–0.1)
Basophils Relative: 0.5 % (ref 0.0–3.0)
Eosinophils Absolute: 0.1 10*3/uL (ref 0.0–0.7)
Eosinophils Relative: 1.8 % (ref 0.0–5.0)
HCT: 38.7 % (ref 36.0–46.0)
Hemoglobin: 12.7 g/dL (ref 12.0–15.0)
Lymphocytes Relative: 40.4 % (ref 12.0–46.0)
Lymphs Abs: 3.2 10*3/uL (ref 0.7–4.0)
MCHC: 32.8 g/dL (ref 30.0–36.0)
MCV: 83.3 fl (ref 78.0–100.0)
Monocytes Absolute: 0.9 10*3/uL (ref 0.1–1.0)
Monocytes Relative: 10.9 % (ref 3.0–12.0)
Neutro Abs: 3.7 10*3/uL (ref 1.4–7.7)
Neutrophils Relative %: 46.4 % (ref 43.0–77.0)
Platelets: 258 10*3/uL (ref 150.0–400.0)
RBC: 4.64 Mil/uL (ref 3.87–5.11)
RDW: 15.9 % — ABNORMAL HIGH (ref 11.5–15.5)
WBC: 7.9 10*3/uL (ref 4.0–10.5)

## 2019-07-11 LAB — COMPREHENSIVE METABOLIC PANEL
ALT: 31 U/L (ref 0–35)
AST: 19 U/L (ref 0–37)
Albumin: 4.2 g/dL (ref 3.5–5.2)
Alkaline Phosphatase: 45 U/L (ref 39–117)
BUN: 15 mg/dL (ref 6–23)
CO2: 32 mEq/L (ref 19–32)
Calcium: 9.5 mg/dL (ref 8.4–10.5)
Chloride: 104 mEq/L (ref 96–112)
Creatinine, Ser: 0.7 mg/dL (ref 0.40–1.20)
GFR: 98.62 mL/min (ref >60.00)
Glucose, Bld: 90 mg/dL (ref 70–99)
Potassium: 4.2 mEq/L (ref 3.5–5.1)
Sodium: 142 mEq/L (ref 135–145)
Total Bilirubin: 0.4 mg/dL (ref 0.2–1.2)
Total Protein: 6.4 g/dL (ref 6.0–8.3)

## 2019-07-11 LAB — HEMOGLOBIN A1C: Hgb A1c MFr Bld: 6.8 % — ABNORMAL HIGH (ref 4.6–6.5)

## 2019-07-11 LAB — VITAMIN D 25 HYDROXY (VIT D DEFICIENCY, FRACTURES): VITD: 111.87 ng/mL (ref 30.00–100.00)

## 2019-07-11 MED ORDER — AMLODIPINE BESYLATE 5 MG PO TABS: 7.5000 mg | ORAL_TABLET | Freq: Every day | ORAL | 1 refills | Status: DC

## 2019-07-11 NOTE — Telephone Encounter (Signed)
CRITICAL VALUE STICKER  CRITICAL VALUE:  Vitamin D 111.87  RECEIVER (on-site recipient of call): Elza Rafter RN  DATE & TIME NOTIFIED: 07/11/19 at 1630  MESSENGER (representative from lab): Holly  MD NOTIFIED: Dr Quay Burow  TIME OF NOTIFICATION: 7505  RESPONSE: hold vitamin d for one month and decrease dose to 1/2 of what she is taking  Pt called & message left for patient to return phone call re: labwork.

## 2019-07-11 NOTE — Assessment & Plan Note (Signed)
Intermittent, chronic Takes gabapentin nightly Uses tizanidine on occasion continue

## 2019-07-11 NOTE — Assessment & Plan Note (Signed)
Chronic BP well controlled Current regimen effective and well tolerated Continue current medications at current doses cmp  

## 2019-07-11 NOTE — Assessment & Plan Note (Signed)
Chronic Taking potassium daily

## 2019-07-11 NOTE — Assessment & Plan Note (Signed)
Chronic On prolia Q 6 months - due later this month Taking vitamin d - check level Stressed regular exercise

## 2019-07-11 NOTE — Assessment & Plan Note (Signed)
Chronic Controlled, stable Continue current dose of medication Taking xanax as needed - maybe once a week continue

## 2019-07-11 NOTE — Assessment & Plan Note (Addendum)
Chronic Taking mobic daily prn Advised regular exercise and weight loss cmp

## 2019-07-11 NOTE — Assessment & Plan Note (Addendum)
Chronic Check a1c Low sugar / carb diet Stressed regular exercise Advised weight loss 

## 2019-07-11 NOTE — Assessment & Plan Note (Signed)
Chronic Check lipid panel  Continue daily statin Regular exercise and healthy diet encouraged  

## 2019-07-12 ENCOUNTER — Ambulatory Visit: Payer: Medicare Other | Admitting: Family Medicine

## 2019-07-12 ENCOUNTER — Encounter: Payer: Self-pay | Admitting: Family Medicine

## 2019-07-12 ENCOUNTER — Ambulatory Visit: Payer: Medicare Other | Admitting: Internal Medicine

## 2019-07-12 VITALS — BP 142/86 | HR 86 | Ht 64.0 in | Wt 168.0 lb

## 2019-07-12 DIAGNOSIS — M1712 Unilateral primary osteoarthritis, left knee: Secondary | ICD-10-CM

## 2019-07-12 DIAGNOSIS — M25562 Pain in left knee: Secondary | ICD-10-CM | POA: Diagnosis not present

## 2019-07-12 DIAGNOSIS — G8929 Other chronic pain: Secondary | ICD-10-CM

## 2019-07-12 NOTE — Progress Notes (Signed)
[place] Phone: [phone] Subjective:   Kristina Ayala, am serving as a scribe for Dr. Hulan Saas. This visit occurred during the SARS-CoV-2 public health emergency.  Safety protocols were in place, including screening questions prior to the visit, additional usage of staff PPE, and extensive cleaning of exam room while observing appropriate contact time as indicated for disinfecting solutions.   I'm seeing this patient by the request  of:  [name], MD  CC: Back pain knee pain  IRW:[id number]   06/07/2019 Chronic low back pain.  X-rays ordered today.  Known arthritic changes.  Encouraged to monitor weight loss.  Patient has had an MRI previously with an L5 nerve root impingement and has responded well to epidurals previously.  Was multiple years ago and will repeat x-rays.  Discussed different medications again.  Increase gabapentin to 400 mg at night.  Zanaflex for breakthrough.  Ayala degenerative changes of the left knee.  Discussed the potential for injection.  We will get x-rays to further evaluate for any type of fracture secondary to fall.  Discussed icing regimen.  Topical anti-inflammatories given.  Home exercises given.  Meloxicam for breakthrough.  Could consider the possibility of injections again.  Follow-up again in 4 to 6 weeks  Update 07/12/2019 Kristina Ayala is a 74 y.o. female coming in with complaint of left knee and low back pain. Patient states that epidural on 06/22/2019 did not help alleviate her pain. Pain is radiating from left hip to left foot. Left knee is giving out on her.  Patient did have the tibial plateau fracture that needed repair previously.  Continues to having worsening pain she states.  Patient is seeing another provider since I have seen her and has had injections with minimal success.  Patient feels like something is just not right with the leg.  States that is worsened  since the fall      Past Medical History:  Diagnosis Date  . Arthritis   . Bursitis    left elbow  . Clostridium difficile infection   . Frequent falls   . HLD (hyperlipidemia)   . Hypertension   . Infectious colitis    Past Surgical History:  Procedure Laterality Date  . CATARACT EXTRACTION Bilateral   . COLONOSCOPY    . ORIF PROXIMAL TIBIAL PLATEAU FRACTURE Left 02/19/2012   knee  . POLYPECTOMY    . TONSILLECTOMY     Social History   Socioeconomic History  . Marital status: Divorced    Spouse name: Not on file  . Number of children: 2  . Years of education: Not on file  . Highest education level: Not on file  Occupational History  . Occupation: RETIRED  Tobacco Use  . Smoking status: Former Smoker    Packs/day: 0.25    Years: 50.00    Pack years: 12.50    Types: Cigarettes    Quit date: 08/28/2014    Years since quitting: 4.8  . Smokeless tobacco: Never Used  . Tobacco comment: 1 pack every 3 days  Vaping Use  . Vaping Use: Never used  Substance and Sexual Activity  . Alcohol use: Ayala    Alcohol/week: 0.0 standard drinks  . Drug use: Ayala  . Sexual activity: Not Currently  Other Topics Concern  . Not on file  Social History Narrative   Lives alone has 2 supportive children.      Social Determinants of Health  Financial Resource Strain:   . Difficulty of Paying Living Expenses:   Food Insecurity:   . Worried About Charity fundraiser in the Last Year:   . Arboriculturist in the Last Year:   Transportation Needs:   . Film/video editor (Medical):   [name] Lack of Transportation (Non-Medical):   Physical Activity:   . Days of Exercise per Week:   . Minutes of Exercise per Session:   Stress:   . Feeling of Stress :   Social Connections:   . Frequency of Communication with Friends and Family:   . Frequency of Social Gatherings with Friends and Family:   . Attends Religious Services:   . Active Member of Clubs or Organizations:   . Attends Theatre manager Meetings:   [name] Marital Status:    Allergies  Allergen Reactions  . Doxycycline Diarrhea  . Minocycline Diarrhea  . Wellbutrin [Bupropion] Swelling    Per pt her tongue was swollen and sore/symptoms stopped once the medication was discontinued.   [name] Penicillins Rash    Has patient had a PCN reaction causing immediate rash, facial/tongue/throat swelling, SOB or lightheadedness with hypotension: yes Has patient had a PCN reaction causing severe rash involving mucus membranes or skin necrosis: Ayala Has patient had a PCN reaction that required hospitalization: unknown Has patient had a PCN reaction occurring within the last 10 years: Ayala If all of the above answers are "Ayala", then may proceed with Cephalosporin use.    Family History  Problem Relation Age of Onset  . Diverticulosis Mother   . Ovarian cancer Mother   . Pancreatic cancer Father   . Lung cancer Father   . Colon polyps Sister   . Colon cancer Neg Hx     Current Outpatient Medications (Endocrine & Metabolic):  .  denosumab (PROLIA) 60 MG/ML SOSY injection, INJECT 60 MG INTO THE SKIN ONCE FOR 1 DOSE  Current Outpatient Medications (Cardiovascular):  .  amLODipine (NORVASC) 5 MG tablet, Take 1.5 tablets (7.5 mg total) by mouth daily. [name]  atorvastatin (LIPITOR) 40 MG tablet, TAKE 1 TABLET BY MOUTH EVERY MORNING. NEED OFFICE VISIT .  lisinopril (ZESTRIL) 40 MG tablet, TAKE 1 TABLET(40 MG) BY MOUTH DAILY .  hydrochlorothiazide (HYDRODIURIL) 25 MG tablet, TAKE 1 TABLET(25 MG) BY MOUTH EVERY MORNING   Current Outpatient Medications (Analgesics):  .  aspirin EC 81 MG tablet, Take 81 mg by mouth daily. .  meloxicam (MOBIC) 7.5 MG tablet, Take 1 tablet (7.5 mg total) by mouth daily.  Current Outpatient Medications (Hematological):  [name]  Cyanocobalamin (VITAMIN B-12) 2500 MCG SUBL, Place 1 tablet under the tongue daily. .  ferrous sulfate 325 (65 FE) MG tablet, Take 1 tablet (325 mg total) by mouth daily with  breakfast.  Current Outpatient Medications (Other):  [name]  ALPRAZolam (XANAX) 0.5 MG tablet, TAKE 1 TABLET(0.5 MG) BY MOUTH AT BEDTIME AS NEEDED FOR ANXIETY .  Cholecalciferol (VITAMIN D3) 10000 units TABS, Take 1 tablet by mouth once a week.  .  gabapentin (NEURONTIN) 400 MG capsule, Take 1 capsule (400 mg total) by mouth at bedtime. .  Magnesium 100 MG CAPS, Take 100 mg by mouth daily. .  potassium chloride (KLOR-CON) 8 MEQ tablet, TAKE 2 TABLETS(16 MEQ) BY MOUTH DAILY .  tiZANidine (ZANAFLEX) 4 MG tablet, Take 1 tablet (4 mg total) by mouth daily as needed for muscle spasms. .  TURMERIC PO, Take 1 tablet by mouth daily. .  Vitamin D, Ergocalciferol, (DRISDOL)  1.25 MG (50000 UNIT) CAPS capsule, Take 1 capsule (50,000 Units total) by mouth every 7 (seven) days.   Reviewed prior external information including notes and imaging from  primary care provider As well as notes that were available from care everywhere and other healthcare systems.  Past medical history, social, surgical and family history all reviewed in electronic medical record.  Ayala pertanent information unless stated regarding to the chief complaint.   Review of Systems:  Ayala headache, visual changes, nausea, vomiting, diarrhea, constipation, dizziness, abdominal pain, skin rash, fevers, chills, night sweats, weight loss, swollen lymph nodes, body aches, joint swelling, chest pain, shortness of breath, mood changes. POSITIVE muscle aches  Objective  Blood pressure (!) 142/86, pulse 86, height 5\' 4"  (1.626 m), weight 168 lb (76.2 kg), SpO2 97 %.   General: Ayala apparent distress alert and oriented x3 mood and affect normal, dressed appropriately.  HEENT: Pupils equal, extraocular movements intact  Respiratory: Patient's speak in full sentences and does not appear short of breath  Cardiovascular: Ayala lower extremity edema, non tender, Ayala erythema  Neuro: Cranial nerves II through XII are intact, neurovascularly intact in all  extremities with 2+ DTRs and 2+ pulses.  Gait antalgic Left knee does have some mild increase in instability with valgus and varus force than previous exams.  Patient has some very mild atrophy of the thigh musculature compared to the contralateral side.  Patient does have full extension and seems to have full flexion.    Impression and Recommendations:     The above documentation has been reviewed and is accurate and complete Lyndal Pulley, DO       Note: This dictation was prepared with Dragon dictation along with smaller phrase technology. Any transcriptional errors that result from this process are unintentional.

## 2019-07-12 NOTE — Patient Instructions (Signed)
Good to see you  We will get MRI of the knee and see what is going on  After the MRI I will write you and discuss next steps Every pound you lose is 4 pounds to the knee!

## 2019-07-12 NOTE — Assessment & Plan Note (Signed)
Known arthritic changes.  Patient has had multiple injections previously but now having unfortunately increasing instability ever since her most recent fall.  Injections by previous provider did not seem to be helpful.  I do believe with patient having a history of a tibial plateau fracture a stress fracture or a OCD could be potentially missed and I do feel an MRI is necessary.  Patient will consider the possibility of surgical intervention if necessary.  Follow-up again after imaging to discuss further treatment options.

## 2019-07-14 ENCOUNTER — Encounter: Payer: Self-pay | Admitting: Internal Medicine

## 2019-07-16 ENCOUNTER — Telehealth: Payer: Self-pay | Admitting: Internal Medicine

## 2019-07-16 NOTE — Telephone Encounter (Signed)
° ° °  Patient requesting call to discuss A1C, diabetic diagnosis

## 2019-07-17 ENCOUNTER — Encounter: Payer: Medicare Other | Admitting: Physical Therapy

## 2019-07-17 NOTE — Telephone Encounter (Signed)
New message:   Pt is calling in reference to her results about being a diabetic. She would like to discuss this further with the assistant. Please advise.

## 2019-07-18 NOTE — Telephone Encounter (Signed)
Called and left message for patient today to call office back to discuss.

## 2019-07-27 ENCOUNTER — Ambulatory Visit (INDEPENDENT_AMBULATORY_CARE_PROVIDER_SITE_OTHER): Payer: Medicare Other | Admitting: *Deleted

## 2019-07-27 ENCOUNTER — Other Ambulatory Visit: Payer: Self-pay

## 2019-07-27 DIAGNOSIS — M810 Age-related osteoporosis without current pathological fracture: Secondary | ICD-10-CM

## 2019-07-27 MED ORDER — DENOSUMAB 60 MG/ML ~~LOC~~ SOSY
60.0000 mg | PREFILLED_SYRINGE | Freq: Once | SUBCUTANEOUS | Status: AC
  Administered 2019-07-27: 60 mg via SUBCUTANEOUS

## 2019-07-27 NOTE — Progress Notes (Signed)
prolia Injection given.   Mana Morison J Tamaka Sawin, MD  

## 2019-08-06 ENCOUNTER — Ambulatory Visit: Payer: Medicare Other

## 2019-08-14 ENCOUNTER — Ambulatory Visit: Payer: Medicare Other

## 2019-08-16 ENCOUNTER — Other Ambulatory Visit: Payer: Self-pay | Admitting: Internal Medicine

## 2019-08-20 ENCOUNTER — Telehealth: Payer: Self-pay | Admitting: Internal Medicine

## 2019-08-20 MED ORDER — ATORVASTATIN CALCIUM 40 MG PO TABS: 40.0000 mg | ORAL_TABLET | ORAL | 1 refills | Status: DC

## 2019-08-20 NOTE — Telephone Encounter (Signed)
Will update prescription.  New prescription sent.

## 2019-08-20 NOTE — Telephone Encounter (Signed)
    [employer] rep calling on behalf of patient  to request atorvastatin (LIPITOR) 40 MG tablet be sent to [facility] [place]. [employer] states patient told them she only takes I tablet of medication every other day. Pharmacy requesting updated instructions  Please advise

## 2019-08-23 ENCOUNTER — Ambulatory Visit
Admission: RE | Admit: 2019-08-23 | Discharge: 2019-08-23 | Disposition: A | Discharge status: Home / Self Care | Payer: Medicare Other | Source: Ambulatory Visit | Attending: Internal Medicine | Admitting: Internal Medicine

## 2019-08-23 ENCOUNTER — Other Ambulatory Visit: Payer: Self-pay

## 2019-08-23 ENCOUNTER — Ambulatory Visit
Admission: RE | Admit: 2019-08-23 | Discharge: 2019-08-23 | Disposition: A | Discharge status: Home / Self Care | Payer: Medicare Other | Source: Ambulatory Visit | Attending: Family Medicine | Admitting: Family Medicine

## 2019-08-23 DIAGNOSIS — M25562 Pain in left knee: Secondary | ICD-10-CM | POA: Diagnosis not present

## 2019-08-23 DIAGNOSIS — Z1231 Encounter for screening mammogram for malignant neoplasm of breast: Secondary | ICD-10-CM | POA: Diagnosis not present

## 2019-08-23 DIAGNOSIS — G8929 Other chronic pain: Secondary | ICD-10-CM

## 2019-08-23 NOTE — Progress Notes (Deleted)
Subjective:    Patient ID: Kristina Ayala, female    DOB: 12-18-1945, 74 y.o.   MRN: [id number]  HPI The patient is here for an acute visit.  Snoring:     Medications and allergies reviewed with patient and updated if appropriate.  Patient Active Problem List   Diagnosis Date Noted  . DOE (dyspnea on exertion) 03/20/2019  . Chronic left SI joint pain 03/13/2019  . Leg cramping 01/10/2019  . Osteoarthritis 01/10/2019  . Right lower quadrant abdominal pain 11/09/2018  . Adnexal mass, right 11/09/2018  . Change in stool 10/18/2018  . Lung nodules 06/19/2018  . Abnormal findings on diagnostic imaging of lung 06/08/2018  . Thyromegaly 10/28/2017  . Restless leg syndrome 10/12/2017  . Greater trochanteric bursitis of left hip 07/26/2017  . Hypokalemia 04/18/2017  . Degenerative arthritis of left knee 12/01/2016  . Anxiety 12/01/2016  . Diabetes mellitus without complication ([place]) 54/65/6812  . Bursitis of right shoulder 09/04/2015  . Lateral meniscus derangement 06/03/2015  . Tear of LCL (lateral collateral ligament) of knee 06/03/2015  . Lumbar radiculopathy 09/19/2014  . Low back pain 01/04/2014  . Bilateral knee pain 01/04/2014  . Diverticulosis of colon without hemorrhage 07/26/2013  . Trigger thumb of left hand 03/06/2013  . Essential hypertension, benign 02/15/2013  . Hyperlipidemia 09/28/2012  . COPD (chronic obstructive pulmonary disease) ([place]) 05/22/2012  . Osteoporosis 05/22/2012    Current Outpatient Medications on File Prior to Visit  Medication Sig Dispense Refill  . ALPRAZolam (XANAX) 0.5 MG tablet TAKE 1 TABLET(0.5 MG) BY MOUTH AT BEDTIME AS NEEDED FOR ANXIETY 30 tablet 0  . amLODipine (NORVASC) 5 MG tablet Take 1.5 tablets (7.5 mg total) by mouth daily. 135 tablet 1  . aspirin EC 81 MG tablet Take 81 mg by mouth daily.    [name] atorvastatin (LIPITOR) 40 MG tablet Take 1 tablet (40 mg total) by mouth every other day. 45 tablet 1  . Cholecalciferol  (VITAMIN D3) 10000 units TABS Take 1 tablet by mouth once a week.     . Cyanocobalamin (VITAMIN B-12) 2500 MCG SUBL Place 1 tablet under the tongue daily.    [name] denosumab (PROLIA) 60 MG/ML SOSY injection INJECT 60 MG INTO THE SKIN ONCE FOR 1 DOSE 1 mL 0  . ferrous sulfate 325 (65 FE) MG tablet Take 1 tablet (325 mg total) by mouth daily with breakfast. 30 tablet 3  . gabapentin (NEURONTIN) 400 MG capsule Take 1 capsule (400 mg total) by mouth at bedtime. 30 capsule 3  . hydrochlorothiazide (HYDRODIURIL) 25 MG tablet TAKE 1 TABLET(25 MG) BY MOUTH EVERY MORNING 90 tablet 3  . lisinopril (ZESTRIL) 40 MG tablet TAKE 1 TABLET(40 MG) BY MOUTH DAILY 90 tablet 1  . Magnesium 100 MG CAPS Take 100 mg by mouth daily.    . meloxicam (MOBIC) 7.5 MG tablet Take 1 tablet (7.5 mg total) by mouth daily. 30 tablet 5  . potassium chloride (KLOR-CON) 8 MEQ tablet TAKE 2 TABLETS(16 MEQ) BY MOUTH DAILY 180 tablet 1  . tiZANidine (ZANAFLEX) 4 MG tablet Take 1 tablet (4 mg total) by mouth daily as needed for muscle spasms. 30 tablet 2  . TURMERIC PO Take 1 tablet by mouth daily.    . Vitamin D, Ergocalciferol, (DRISDOL) 1.25 MG (50000 UNIT) CAPS capsule Take 1 capsule (50,000 Units total) by mouth every 7 (seven) days. 12 capsule 0   No current facility-administered medications on file prior to visit.    Past Medical  History:  Diagnosis Date  . Arthritis   . Bursitis    left elbow  . Clostridium difficile infection   . Frequent falls   . HLD (hyperlipidemia)   . Hypertension   . Infectious colitis     Past Surgical History:  Procedure Laterality Date  . CATARACT EXTRACTION Bilateral   . COLONOSCOPY    . ORIF PROXIMAL TIBIAL PLATEAU FRACTURE Left 02/19/2012   knee  . POLYPECTOMY    . TONSILLECTOMY      Social History   Socioeconomic History  . Marital status: Divorced    Spouse name: Not on file  . Number of children: 2  . Years of education: Not on file  . Highest education level: Not on file    Occupational History  . Occupation: RETIRED  Tobacco Use  . Smoking status: Former Smoker    Packs/day: 0.25    Years: 50.00    Pack years: 12.50    Types: Cigarettes    Quit date: 08/28/2014    Years since quitting: 4.9  . Smokeless tobacco: Never Used  . Tobacco comment: 1 pack every 3 days  Vaping Use  . Vaping Use: Never used  Substance and Sexual Activity  . Alcohol use: No    Alcohol/week: 0.0 standard drinks  . Drug use: No  . Sexual activity: Not Currently  Other Topics Concern  . Not on file  Social History Narrative   Lives alone has 2 supportive children.      Social Determinants of Health   Financial Resource Strain:   . Difficulty of Paying Living Expenses:   Food Insecurity:   . Worried About Charity fundraiser in the Last Year:   . Arboriculturist in the Last Year:   Transportation Needs:   . Film/video editor (Medical):   [name] Lack of Transportation (Non-Medical):   Physical Activity:   . Days of Exercise per Week:   . Minutes of Exercise per Session:   Stress:   . Feeling of Stress :   Social Connections:   . Frequency of Communication with Friends and Family:   . Frequency of Social Gatherings with Friends and Family:   . Attends Religious Services:   . Active Member of Clubs or Organizations:   . Attends Archivist Meetings:   [name] Marital Status:     Family History  Problem Relation Age of Onset  . Diverticulosis Mother   . Ovarian cancer Mother   . Pancreatic cancer Father   . Lung cancer Father   . Colon polyps Sister   . Colon cancer Neg Hx     Review of Systems     Objective:  There were no vitals filed for this visit. BP Readings from Last 3 Encounters:  07/12/19 (!) 142/86  07/11/19 128/74  06/22/19 133/83   Wt Readings from Last 3 Encounters:  07/12/19 168 lb (76.2 kg)  07/11/19 169 lb (76.7 kg)  06/07/19 167 lb (75.8 kg)   There is no height or weight on file to calculate BMI.   Physical Exam          Assessment & Plan:    See Problem List for Assessment and Plan of chronic medical problems.    This visit occurred during the SARS-CoV-2 public health emergency.  Safety protocols were in place, including screening questions prior to the visit, additional usage of staff PPE, and extensive cleaning of exam room while observing appropriate contact time as indicated  for disinfecting solutions.

## 2019-08-24 ENCOUNTER — Ambulatory Visit: Payer: Medicare Other | Admitting: Internal Medicine

## 2019-08-24 ENCOUNTER — Other Ambulatory Visit: Payer: Self-pay

## 2019-08-27 NOTE — Progress Notes (Signed)
Left message to call back  

## 2019-08-29 NOTE — Progress Notes (Deleted)
Subjective:    Patient ID: Kristina Ayala, female    DOB: September 01, 1945, 74 y.o.   MRN: [id number]  HPI The patient is here for an acute visit.   Snoring:    Medications and allergies reviewed with patient and updated if appropriate.  Patient Active Problem List   Diagnosis Date Noted  . DOE (dyspnea on exertion) 03/20/2019  . Chronic left SI joint pain 03/13/2019  . Leg cramping 01/10/2019  . Osteoarthritis 01/10/2019  . Right lower quadrant abdominal pain 11/09/2018  . Adnexal mass, right 11/09/2018  . Change in stool 10/18/2018  . Lung nodules 06/19/2018  . Abnormal findings on diagnostic imaging of lung 06/08/2018  . Thyromegaly 10/28/2017  . Restless leg syndrome 10/12/2017  . Greater trochanteric bursitis of left hip 07/26/2017  . Hypokalemia 04/18/2017  . Degenerative arthritis of left knee 12/01/2016  . Anxiety 12/01/2016  . Diabetes mellitus without complication ([place]) 96/78/9381  . Bursitis of right shoulder 09/04/2015  . Lateral meniscus derangement 06/03/2015  . Tear of LCL (lateral collateral ligament) of knee 06/03/2015  . Lumbar radiculopathy 09/19/2014  . Low back pain 01/04/2014  . Bilateral knee pain 01/04/2014  . Diverticulosis of colon without hemorrhage 07/26/2013  . Trigger thumb of left hand 03/06/2013  . Essential hypertension, benign 02/15/2013  . Hyperlipidemia 09/28/2012  . COPD (chronic obstructive pulmonary disease) ([place]) 05/22/2012  . Osteoporosis 05/22/2012    Current Outpatient Medications on File Prior to Visit  Medication Sig Dispense Refill  . ALPRAZolam (XANAX) 0.5 MG tablet TAKE 1 TABLET(0.5 MG) BY MOUTH AT BEDTIME AS NEEDED FOR ANXIETY 30 tablet 0  . amLODipine (NORVASC) 5 MG tablet Take 1.5 tablets (7.5 mg total) by mouth daily. 135 tablet 1  . aspirin EC 81 MG tablet Take 81 mg by mouth daily.    [name] atorvastatin (LIPITOR) 40 MG tablet Take 1 tablet (40 mg total) by mouth every other day. 45 tablet 1  . Cholecalciferol  (VITAMIN D3) 10000 units TABS Take 1 tablet by mouth once a week.     . Cyanocobalamin (VITAMIN B-12) 2500 MCG SUBL Place 1 tablet under the tongue daily.    [name] denosumab (PROLIA) 60 MG/ML SOSY injection INJECT 60 MG INTO THE SKIN ONCE FOR 1 DOSE 1 mL 0  . ferrous sulfate 325 (65 FE) MG tablet Take 1 tablet (325 mg total) by mouth daily with breakfast. 30 tablet 3  . gabapentin (NEURONTIN) 400 MG capsule Take 1 capsule (400 mg total) by mouth at bedtime. 30 capsule 3  . hydrochlorothiazide (HYDRODIURIL) 25 MG tablet TAKE 1 TABLET(25 MG) BY MOUTH EVERY MORNING 90 tablet 3  . lisinopril (ZESTRIL) 40 MG tablet TAKE 1 TABLET(40 MG) BY MOUTH DAILY 90 tablet 1  . Magnesium 100 MG CAPS Take 100 mg by mouth daily.    . meloxicam (MOBIC) 7.5 MG tablet Take 1 tablet (7.5 mg total) by mouth daily. 30 tablet 5  . potassium chloride (KLOR-CON) 8 MEQ tablet TAKE 2 TABLETS(16 MEQ) BY MOUTH DAILY 180 tablet 1  . tiZANidine (ZANAFLEX) 4 MG tablet Take 1 tablet (4 mg total) by mouth daily as needed for muscle spasms. 30 tablet 2  . TURMERIC PO Take 1 tablet by mouth daily.    . Vitamin D, Ergocalciferol, (DRISDOL) 1.25 MG (50000 UNIT) CAPS capsule Take 1 capsule (50,000 Units total) by mouth every 7 (seven) days. 12 capsule 0   No current facility-administered medications on file prior to visit.    Past Medical  History:  Diagnosis Date  . Arthritis   . Bursitis    left elbow  . Clostridium difficile infection   . Frequent falls   . HLD (hyperlipidemia)   . Hypertension   . Infectious colitis     Past Surgical History:  Procedure Laterality Date  . CATARACT EXTRACTION Bilateral   . COLONOSCOPY    . ORIF PROXIMAL TIBIAL PLATEAU FRACTURE Left 02/19/2012   knee  . POLYPECTOMY    . TONSILLECTOMY      Social History   Socioeconomic History  . Marital status: Divorced    Spouse name: Not on file  . Number of children: 2  . Years of education: Not on file  . Highest education level: Not on file    Occupational History  . Occupation: RETIRED  Tobacco Use  . Smoking status: Former Smoker    Packs/day: 0.25    Years: 50.00    Pack years: 12.50    Types: Cigarettes    Quit date: 08/28/2014    Years since quitting: 5.0  . Smokeless tobacco: Never Used  . Tobacco comment: 1 pack every 3 days  Vaping Use  . Vaping Use: Never used  Substance and Sexual Activity  . Alcohol use: No    Alcohol/week: 0.0 standard drinks  . Drug use: No  . Sexual activity: Not Currently  Other Topics Concern  . Not on file  Social History Narrative   Lives alone has 2 supportive children.      Social Determinants of Health   Financial Resource Strain:   . Difficulty of Paying Living Expenses:   Food Insecurity:   . Worried About Charity fundraiser in the Last Year:   . Arboriculturist in the Last Year:   Transportation Needs:   . Film/video editor (Medical):   [name] Lack of Transportation (Non-Medical):   Physical Activity:   . Days of Exercise per Week:   . Minutes of Exercise per Session:   Stress:   . Feeling of Stress :   Social Connections:   . Frequency of Communication with Friends and Family:   . Frequency of Social Gatherings with Friends and Family:   . Attends Religious Services:   . Active Member of Clubs or Organizations:   . Attends Archivist Meetings:   [name] Marital Status:     Family History  Problem Relation Age of Onset  . Diverticulosis Mother   . Ovarian cancer Mother   . Pancreatic cancer Father   . Lung cancer Father   . Colon polyps Sister   . Colon cancer Neg Hx     Review of Systems     Objective:  There were no vitals filed for this visit. BP Readings from Last 3 Encounters:  07/12/19 (!) 142/86  07/11/19 128/74  06/22/19 133/83   Wt Readings from Last 3 Encounters:  07/12/19 168 lb (76.2 kg)  07/11/19 169 lb (76.7 kg)  06/07/19 167 lb (75.8 kg)   There is no height or weight on file to calculate BMI.   Physical Exam          Assessment & Plan:    See Problem List for Assessment and Plan of chronic medical problems.    This visit occurred during the SARS-CoV-2 public health emergency.  Safety protocols were in place, including screening questions prior to the visit, additional usage of staff PPE, and extensive cleaning of exam room while observing appropriate contact time as indicated  for disinfecting solutions.

## 2019-08-30 ENCOUNTER — Ambulatory Visit: Payer: Medicare Other | Admitting: Internal Medicine

## 2019-08-30 DIAGNOSIS — Z0289 Encounter for other administrative examinations: Secondary | ICD-10-CM

## 2019-09-07 ENCOUNTER — Other Ambulatory Visit: Payer: Self-pay | Admitting: Internal Medicine

## 2019-09-10 ENCOUNTER — Other Ambulatory Visit: Payer: Self-pay | Admitting: Internal Medicine

## 2019-09-18 ENCOUNTER — Telehealth: Payer: Self-pay | Admitting: Family Medicine

## 2019-09-18 NOTE — Telephone Encounter (Signed)
Spoke with patient to take OTC Vit D at 2000-4000IU daily per verbal from Dr. Tamala Julian.

## 2019-09-18 NOTE — Telephone Encounter (Signed)
Patient called requesting a refill on Vitamin D, Ergocalciferol, (DRISDOL) 1.25 MG (50000 UNIT) CAPS capsule if Dr Tamala Julian would like for her continue it.

## 2019-09-20 DIAGNOSIS — B349 Viral infection, unspecified: Secondary | ICD-10-CM | POA: Diagnosis not present

## 2019-09-20 DIAGNOSIS — Z20822 Contact with and (suspected) exposure to covid-19: Secondary | ICD-10-CM | POA: Diagnosis not present

## 2019-09-21 NOTE — Progress Notes (Signed)
Phone [phone] Virtual visit via Video note   Subjective:  Chief complaint: Chief Complaint  Patient presents with  . Sinusitis    onset: 5 days , postnasal drainage, headache,dry cough    This visit type was conducted due to national recommendations for restrictions regarding the COVID-19 Pandemic (e.g. social distancing).  This format is felt to be most appropriate for this patient at this time balancing risks to patient and risks to population by having him in for in person visit.  No physical exam was performed (except for noted visual exam or audio findings with Telehealth visits).    Our team/I connected with Kristina Ayala at  9:40 AM EDT by a video enabled telemedicine application (doxy.me or caregility through epic) and verified that I am speaking with the correct person using two identifiers.  Location patient: [address] Location provider: [facility], office Persons participating in the virtual visit:  patient  Our team/I discussed the limitations of evaluation and management by telemedicine and the availability of in person appointments. In light of current covid-19 pandemic, patient also understands that we are trying to protect them by minimizing in office contact if at all possible.  The patient expressed consent for telemedicine visit and agreed to proceed. Patient understands insurance will be billed.   Past Medical History-  Patient Active Problem List   Diagnosis Date Noted  . DOE (dyspnea on exertion) 03/20/2019  . Chronic left SI joint pain 03/13/2019  . Leg cramping 01/10/2019  . Osteoarthritis 01/10/2019  . Right lower quadrant abdominal pain 11/09/2018  . Adnexal mass, right 11/09/2018  . Change in stool 10/18/2018  . Lung nodules 06/19/2018  . Abnormal findings on diagnostic imaging of lung 06/08/2018  . Thyromegaly 10/28/2017  . Restless leg syndrome 10/12/2017  . Greater trochanteric bursitis of left hip 07/26/2017  . Hypokalemia 04/18/2017  .  Degenerative arthritis of left knee 12/01/2016  . Anxiety 12/01/2016  . Diabetes mellitus without complication ([place]) 24/26/8341  . Bursitis of right shoulder 09/04/2015  . Lateral meniscus derangement 06/03/2015  . Tear of LCL (lateral collateral ligament) of knee 06/03/2015  . Lumbar radiculopathy 09/19/2014  . Low back pain 01/04/2014  . Bilateral knee pain 01/04/2014  . Diverticulosis of colon without hemorrhage 07/26/2013  . Trigger thumb of left hand 03/06/2013  . Essential hypertension, benign 02/15/2013  . Hyperlipidemia 09/28/2012  . COPD (chronic obstructive pulmonary disease) ([place]) 05/22/2012  . Osteoporosis 05/22/2012    Medications- reviewed and updated Current Outpatient Medications  Medication Sig Dispense Refill  . ALPRAZolam (XANAX) 0.5 MG tablet TAKE 1 TABLET(0.5 MG) BY MOUTH AT BEDTIME AS NEEDED FOR ANXIETY 30 tablet 0  . amLODipine (NORVASC) 5 MG tablet Take 1.5 tablets (7.5 mg total) by mouth daily. 135 tablet 1  . aspirin EC 81 MG tablet Take 81 mg by mouth daily.    [name] atorvastatin (LIPITOR) 40 MG tablet Take 1 tablet (40 mg total) by mouth every other day. 45 tablet 1  . azithromycin (ZITHROMAX) 250 MG tablet Take 2 tabs on day 1, then 1 tab daily until finished 6 tablet 0  . Cholecalciferol (VITAMIN D3) 10000 units TABS Take 1 tablet by mouth once a week.     . Cyanocobalamin (VITAMIN B-12) 2500 MCG SUBL Place 1 tablet under the tongue daily.    [name] denosumab (PROLIA) 60 MG/ML SOSY injection INJECT 60 MG INTO THE SKIN ONCE FOR 1 DOSE 1 mL 0  . ferrous sulfate 325 (65 FE) MG tablet Take 1  tablet (325 mg total) by mouth daily with breakfast. 30 tablet 3  . gabapentin (NEURONTIN) 400 MG capsule Take 1 capsule (400 mg total) by mouth at bedtime. 30 capsule 3  . hydrochlorothiazide (HYDRODIURIL) 25 MG tablet TAKE 1 TABLET(25 MG) BY MOUTH EVERY MORNING 90 tablet 3  . lisinopril (ZESTRIL) 40 MG tablet TAKE 1 TABLET(40 MG) BY MOUTH DAILY 90 tablet 1  . Magnesium 100 MG  CAPS Take 100 mg by mouth daily.    . meloxicam (MOBIC) 7.5 MG tablet Take 1 tablet (7.5 mg total) by mouth daily. 30 tablet 5  . potassium chloride (KLOR-CON) 8 MEQ tablet TAKE 2 TABLETS(16 MEQ) BY MOUTH DAILY 180 tablet 1  . tiZANidine (ZANAFLEX) 4 MG tablet TAKE 1 TABLET(4 MG) BY MOUTH DAILY AS NEEDED FOR MUSCLE SPASMS 30 tablet 2  . TURMERIC PO Take 1 tablet by mouth daily.    . Vitamin D, Ergocalciferol, (DRISDOL) 1.25 MG (50000 UNIT) CAPS capsule Take 1 capsule (50,000 Units total) by mouth every 7 (seven) days. 12 capsule 0   No current facility-administered medications for this visit.     Objective:  no self reported vitals Gen: NAD, resting comfortably Lungs: nonlabored, normal respiratory rate  Skin: appears dry, no obvious rash     Assessment and Plan   # Head cold/ concern for sinus infection S: symptoms started 10 days ago (initially after shingrix vaccine). Has postnasal drainage, headache and sinus pressure, runny nose, sore throat (now improving with gargles). Feels like symptoms going down into chest.   She is fully vaccinated from covid 19 in march. She was also tested earlier this week at [facility] and that was negative.   Known COPD but not on regular inhalers. Doing some ginger root tea. Has hypertension- knows to avoid decongestants.  No reported fever at this time A/P: 74 year old female with COPD and hypertension presenting with nasal congestion, sinus pressure for now 10 days.  She is vaccinated from [place] also tested negative for COVID-19 since illness started at urgent care.  She meets criteria for bacterial sinusitis.  She has allergy to penicillin and does not want to try Augmentin as a result.  Also has allergy to doxycycline.  She states she has tolerated azithromycin in the past-I sent this into try.  We discussed there is some resistance in the community to azithromycin for sinusitis and if she fails to improve would recommend  follow-up with Dr. Quay Burow next week  COPD appears stable without medication.  She was very concerned about symptoms progressing to her chest-if she has new or worsening symptoms should follow-up certainly.  Offered sending in albuterol-she has no wheeze or shortness of breath at present and declines for now.  Recommended follow up: As needed if symptoms worsen or fail to improve Future Appointments  Date Time Provider [place]  01/11/2020  9:00 AM [place] ADVISOR [id number] None    Lab/Order associations:   ICD-10-CM   1. Bacterial sinusitis  J32.9    B96.89   2. Other emphysema ([place])  J43.8     Meds ordered this encounter  Medications  . azithromycin (ZITHROMAX) 250 MG tablet    Sig: Take 2 tabs on day 1, then 1 tab daily until finished    Dispense:  6 tablet    Refill:  0   Return precautions advised.  Garret Reddish, MD

## 2019-09-22 ENCOUNTER — Encounter: Payer: Self-pay | Admitting: Family Medicine

## 2019-09-22 ENCOUNTER — Telehealth (INDEPENDENT_AMBULATORY_CARE_PROVIDER_SITE_OTHER): Payer: Medicare Other | Admitting: Family Medicine

## 2019-09-22 DIAGNOSIS — B9689 Other specified bacterial agents as the cause of diseases classified elsewhere: Secondary | ICD-10-CM

## 2019-09-22 DIAGNOSIS — J438 Other emphysema: Secondary | ICD-10-CM

## 2019-09-22 DIAGNOSIS — J329 Chronic sinusitis, unspecified: Secondary | ICD-10-CM | POA: Diagnosis not present

## 2019-09-22 MED ORDER — AZITHROMYCIN 250 MG PO TABS: ORAL_TABLET | ORAL | 0 refills | Status: DC

## 2019-09-24 ENCOUNTER — Telehealth: Payer: Medicare Other | Admitting: Internal Medicine

## 2019-09-24 ENCOUNTER — Telehealth: Payer: Self-pay | Admitting: Internal Medicine

## 2019-09-24 MED ORDER — CEPHALEXIN 500 MG PO CAPS: 500.0000 mg | ORAL_CAPSULE | Freq: Three times a day (TID) | ORAL | 0 refills | Status: DC

## 2019-09-24 NOTE — Telephone Encounter (Signed)
Notified pt MD sent rx to [facility] 

## 2019-09-24 NOTE — Telephone Encounter (Signed)
Different antibiotic sent to pharmacy

## 2019-09-24 NOTE — Telephone Encounter (Signed)
New Message:   Pt is calling and states that she was prescribed a Z-pak on Saturday with the physician. She states it is giving her diarrhea and she would like to be prescribed something else. Please advise.

## 2019-09-25 ENCOUNTER — Other Ambulatory Visit: Payer: Self-pay | Admitting: Internal Medicine

## 2019-09-26 ENCOUNTER — Telehealth: Payer: Self-pay | Admitting: Family Medicine

## 2019-09-26 NOTE — Telephone Encounter (Signed)
Patient called asking if another order for an epidural could be placed. She is still have continued issues with her back pain.

## 2019-09-27 ENCOUNTER — Telehealth: Payer: Self-pay | Admitting: Emergency Medicine

## 2019-09-27 ENCOUNTER — Other Ambulatory Visit: Payer: Self-pay

## 2019-09-27 DIAGNOSIS — M5416 Radiculopathy, lumbar region: Secondary | ICD-10-CM

## 2019-09-27 NOTE — Telephone Encounter (Signed)
Called pt there was no answer LMOM left msg for her to continue taking the antibiotic that was given by Dr. Quay Burow on Monday 09/24/19. She [place] not to take the zpac that was given her diarrhea. It will take time for cold sxs to resolve she have a 1 supply on the cephalexin. If no better after she finish antibiotic she may need a virtual appt for f/u.Marland KitchenJohny Chess

## 2019-09-27 NOTE — Telephone Encounter (Signed)
Yes lets do the same but if it does not last through the rest of the year may need referral

## 2019-09-27 NOTE — Telephone Encounter (Signed)
Patient notified

## 2019-09-27 NOTE — Telephone Encounter (Signed)
Pt called stating she still has a head cold. She wants to know if she should continue to take the antibiotic that DR Quay Burow prescribed her that is not working or continue to take the zpak that is giving her diarrhea? Please advise thanks

## 2019-10-04 ENCOUNTER — Other Ambulatory Visit: Payer: Medicare Other

## 2019-10-11 ENCOUNTER — Other Ambulatory Visit: Payer: Self-pay | Admitting: Internal Medicine

## 2019-10-11 NOTE — Telephone Encounter (Signed)
Check [place] registry last filled 09/11/2019.Marland KitchenJohny Ayala

## 2019-10-17 ENCOUNTER — Ambulatory Visit
Admission: RE | Admit: 2019-10-17 | Discharge: 2019-10-17 | Disposition: A | Discharge status: Home / Self Care | Payer: Medicare Other | Source: Ambulatory Visit | Attending: Family Medicine | Admitting: Family Medicine

## 2019-10-17 ENCOUNTER — Other Ambulatory Visit: Payer: Self-pay

## 2019-10-17 DIAGNOSIS — M5416 Radiculopathy, lumbar region: Secondary | ICD-10-CM

## 2019-10-17 DIAGNOSIS — M47817 Spondylosis without myelopathy or radiculopathy, lumbosacral region: Secondary | ICD-10-CM | POA: Diagnosis not present

## 2019-10-17 MED ORDER — IOPAMIDOL (ISOVUE-M 200) INJECTION 41%
1.0000 mL | Freq: Once | INTRAMUSCULAR | Status: AC
  Administered 2019-10-17: 1 mL via EPIDURAL

## 2019-10-17 MED ORDER — METHYLPREDNISOLONE ACETATE 40 MG/ML INJ SUSP (RADIOLOG
120.0000 mg | Freq: Once | INTRAMUSCULAR | Status: AC
  Administered 2019-10-17: 120 mg via EPIDURAL

## 2019-10-17 NOTE — Discharge Instructions (Signed)

## 2019-10-20 ENCOUNTER — Other Ambulatory Visit: Payer: Self-pay | Admitting: Internal Medicine

## 2019-10-23 DIAGNOSIS — H524 Presbyopia: Secondary | ICD-10-CM | POA: Diagnosis not present

## 2019-10-23 DIAGNOSIS — H43813 Vitreous degeneration, bilateral: Secondary | ICD-10-CM | POA: Diagnosis not present

## 2019-10-23 DIAGNOSIS — H0100A Unspecified blepharitis right eye, upper and lower eyelids: Secondary | ICD-10-CM | POA: Diagnosis not present

## 2019-11-05 ENCOUNTER — Ambulatory Visit: Payer: Self-pay

## 2019-11-05 ENCOUNTER — Ambulatory Visit (INDEPENDENT_AMBULATORY_CARE_PROVIDER_SITE_OTHER): Payer: Medicare Other | Admitting: Family Medicine

## 2019-11-05 ENCOUNTER — Encounter: Payer: Self-pay | Admitting: Family Medicine

## 2019-11-05 ENCOUNTER — Other Ambulatory Visit: Payer: Self-pay

## 2019-11-05 VITALS — BP 130/80 | HR 94 | Ht 64.0 in | Wt 169.0 lb

## 2019-11-05 DIAGNOSIS — M25571 Pain in right ankle and joints of right foot: Secondary | ICD-10-CM | POA: Diagnosis not present

## 2019-11-05 NOTE — Patient Instructions (Signed)
Thank you for coming in today.  I think it is possible that you have tendonitis.  Do the home exercises.  Use voltaren gel.  Recheck if not better.    Peroneal Tendinopathy Rehab Ask your health care provider which exercises are safe for you. Do exercises exactly as told by your health care provider and adjust them as directed. It is normal to feel mild stretching, pulling, tightness, or discomfort as you do these exercises. Stop right away if you feel sudden pain or your pain gets worse. Do not begin these exercises until told by your health care provider. Stretching and range-of-motion exercises These exercises warm up your muscles and joints and improve the movement and flexibility of your ankle. These exercises also help to relieve pain and stiffness. Gastroc and soleus stretch, standing This is an exercise in which you stand on a step and use your body weight to stretch your calf muscles. To do this exercise: 1. Stand on the edge of a step on the ball of your left / right foot. The ball of your foot is on the walking surface, right under your toes. 2. Keep your other foot firmly on the same step. 3. Hold on to the wall, a railing, or a chair for balance. 4. Slowly lift your other foot, allowing your body weight to press your left / right heel down over the edge of the step. You should feel a stretch in your left / right calf (gastrocnemius and soleus). 5. Hold this position for __________ seconds. 6. Return both feet to the step. 7. Repeat this exercise with a slight bend in your left / right knee. Repeat __________ times with your left / right knee straight and __________ times with your left / right knee bent. Complete this exercise __________ times a day. Strengthening exercises These exercises build strength and endurance in your foot and ankle. Endurance is the ability to use your muscles for a long time, even after they get tired. Ankle dorsiflexion with band  1. Secure a rubber  exercise band or tube to an object, such as a table leg, that will not move when the band is pulled. 2. Secure the other end of the band around your left / right foot. 3. Sit on the floor, facing the object with your left / right leg extended. The band or tube should be slightly tense when your foot is relaxed. 4. Slowly flex your left / right ankle and toes to bring your foot toward you (dorsiflexion). 5. Hold this position for __________ seconds. 6. Let the band or tube slowly pull your foot back to the starting position. Repeat __________ times. Complete this exercise __________ times a day. Ankle eversion 1. Sit on the floor with your legs straight out in front of you. 2. Loop a rubber exercise band or tube around the ball of your left / right foot. The ball of your foot is on the walking surface, right under your toes. 3. Hold the ends of the band in your hands, or secure the band to a stable object. The band or tube should be slightly tense when your foot is relaxed. 4. Slowly push your foot outward, away from your other leg (eversion). 5. Hold this position for __________ seconds. 6. Slowly return your foot to the starting position. Repeat __________ times. Complete this exercise __________ times a day. Plantar flexion, standing This exercise is sometimes called standing heel raise. 1. Stand with your feet shoulder-width apart. 2. Place your hands  on a wall or table to steady yourself as needed, but try not to use it for support. 3. Keep your weight spread evenly over the width of your feet while you slowly rise up on your toes (plantar flexion). If told by your health care provider: ? Shift your weight toward your left / right leg until you feel challenged. ? Stand on your left / right leg only. 4. Hold this position for __________ seconds. Repeat __________ times. Complete this exercise __________ times a day. Single leg stand 1. Without shoes, stand near a railing or in a doorway.  You may hold on to the railing or door frame as needed. 2. Stand on your left / right foot. Keep your big toe down on the floor and try to keep your arch lifted. ? Do not roll to the outside of your foot. ? If this exercise is too easy, you can try it with your eyes closed or while standing on a pillow. 3. Hold this position for __________ seconds. Repeat __________ times. Complete this exercise __________ times a day. This information is not intended to replace advice given to you by your health care provider. Make sure you discuss any questions you have with your health care provider. Document Revised: 05/02/2018 Document Reviewed: 05/02/2018 Elsevier Patient Education  [place].

## 2019-11-05 NOTE — Progress Notes (Signed)
   Rito Ehrlich, am serving as a Education administrator for Dr. [name].  Kristina Ayala is a 74 y.o. female who presents to [place] at [facility] today for R ankle pain. Patient was last seen by Dr. Tamala Julian on 07/12/2019 for Left knee pain. Since last visit patient complains of R ankle pain X6 days and describes pain as stabbing pain. Patient locates pain to posterior lateral ankle.  She cannot recall any injury or sprain.  Symptoms ongoing for only a few days now.  No radiating pain.  She notes the pain is improving over the last few days to almost absent now.  She thinks her symptoms do not feel like previous episodes of lumbosacral radiculopathy.  Additionally she has some questions about timing of the Covid booster shot.  She received Pfizer Covid vaccine with the last 1 on March 15.  Swelling: no Mechanical symptoms: no Aggravating symptoms: walking; standing  Tried: ice; elevation; ibuprofen     Pertinent review of systems: No fevers or chills  Relevant historical information: Diabetes, COPD, hypertension.   Exam:  BP 130/80 (BP Location: Left Arm, Patient Position: Sitting, Cuff Size: Normal)   Pulse 94   Ht 5\' 4"  (1.626 m)   Wt 169 lb (76.7 kg)   SpO2 97%   BMI 29.01 kg/m  General: Well Developed, well nourished, and in no acute distress.   MSK: Right ankle normal-appearing minimally tender posterior aspect of lateral malleolus nontender otherwise.  Normal motion and strength.  Stable ligamentous exam normal gait.      Assessment and Plan: 74 y.o. female with right ankle pain occurring about a week ago without clear cause.  Not associated with swelling and already spontaneously improving.  Etiology is somewhat unclear.  However her minimal symptoms can be treated conservatively.  Treat as though peroneal tendinitis with home exercise program taught in clinic today by me.  Recommend also Voltaren gel.  If not better proceed with x-ray ultrasound and  further evaluation etc.   PDMP not reviewed this encounter. No orders of the defined types were placed in this encounter.  No orders of the defined types were placed in this encounter.    Discussed warning signs or symptoms. Please see discharge instructions. Patient expresses understanding.   The above documentation has been reviewed and is accurate and complete [name], M.D.  Total encounter time 20 minutes including face-to-face time with the patient and, reviewing past medical record, and charting on the date of service.   Treatment plan and options

## 2019-11-07 NOTE — Progress Notes (Signed)
Subjective:    Patient ID: Kristina Ayala, female    DOB: [birth year], 74 y.o.   MRN: [id number]  HPI The patient is here for an acute visit.  She is taking her medications daily as prescribed.  She has had some increased stress recently.  Right abdominal pain.  She started having pain in the right side of her abdomen on Monday, 3 days ago.  The pain is intermittent.  Sometimes it is a throbbing pain.  She thinks it is worse sometimes with sitting up or picking up something.  It is not worse with eating.  The night it started and 2 nights ago she had gastric fluid reflux and was spitting it up.  She states decreased appetite.  She has had some nausea.  She denies any constipation, diarrhea, blood in the stool or urinary symptoms.    Ensure this morning - last meal  Medications and allergies reviewed with patient and updated if appropriate.  Patient Active Problem List   Diagnosis Date Noted  . DOE (dyspnea on exertion) 03/20/2019  . Chronic left SI joint pain 03/13/2019  . Leg cramping 01/10/2019  . Osteoarthritis 01/10/2019  . Right lower quadrant abdominal pain 11/09/2018  . Adnexal mass, right 11/09/2018  . Change in stool 10/18/2018  . Lung nodules 06/19/2018  . Abnormal findings on diagnostic imaging of lung 06/08/2018  . Thyromegaly 10/28/2017  . Restless leg syndrome 10/12/2017  . Greater trochanteric bursitis of left hip 07/26/2017  . Hypokalemia 04/18/2017  . Degenerative arthritis of left knee 12/01/2016  . Anxiety 12/01/2016  . Diabetes mellitus without complication ([place]) 29/92/4268  . Bursitis of right shoulder 09/04/2015  . Lateral meniscus derangement 06/03/2015  . Tear of LCL (lateral collateral ligament) of knee 06/03/2015  . Lumbar radiculopathy 09/19/2014  . Low back pain 01/04/2014  . Bilateral knee pain 01/04/2014  . Diverticulosis of colon without hemorrhage 07/26/2013  . Trigger thumb of left hand 03/06/2013  . Essential hypertension, benign  02/15/2013  . Hyperlipidemia 09/28/2012  . COPD (chronic obstructive pulmonary disease) ([place]) 05/22/2012  . Osteoporosis 05/22/2012    Current Outpatient Medications on File Prior to Visit  Medication Sig Dispense Refill  . ALPRAZolam (XANAX) 0.5 MG tablet TAKE 1 TABLET(0.5 MG) BY MOUTH AT BEDTIME AS NEEDED FOR ANXIETY 30 tablet 3  . amLODipine (NORVASC) 5 MG tablet Take 1.5 tablets (7.5 mg total) by mouth daily. 135 tablet 1  . aspirin EC 81 MG tablet Take 81 mg by mouth daily.    [name] atorvastatin (LIPITOR) 40 MG tablet Take 1 tablet (40 mg total) by mouth every other day. 45 tablet 1  . Cholecalciferol (VITAMIN D3) 10000 units TABS Take 1 tablet by mouth once a week.     . Cyanocobalamin (VITAMIN B-12) 2500 MCG SUBL Place 1 tablet under the tongue daily.    [name] denosumab (PROLIA) 60 MG/ML SOSY injection INJECT 60 MG INTO THE SKIN ONCE FOR 1 DOSE 1 mL 0  . ferrous sulfate 325 (65 FE) MG tablet Take 1 tablet (325 mg total) by mouth daily with breakfast. 30 tablet 3  . gabapentin (NEURONTIN) 400 MG capsule Take 1 capsule (400 mg total) by mouth at bedtime. 30 capsule 3  . hydrochlorothiazide (HYDRODIURIL) 25 MG tablet TAKE 1 TABLET(25 MG) BY MOUTH EVERY MORNING 90 tablet 3  . lisinopril (ZESTRIL) 40 MG tablet TAKE 1 TABLET(40 MG) BY MOUTH DAILY 90 tablet 1  . Magnesium 100 MG CAPS Take 100 mg by mouth daily.    [name]  meloxicam (MOBIC) 7.5 MG tablet Take 1 tablet (7.5 mg total) by mouth daily. 30 tablet 5  . potassium chloride (KLOR-CON) 8 MEQ tablet TAKE 2 TABLETS(16 MEQ) BY MOUTH DAILY 180 tablet 1  . tiZANidine (ZANAFLEX) 4 MG tablet TAKE 1 TABLET(4 MG) BY MOUTH DAILY AS NEEDED FOR MUSCLE SPASMS 30 tablet 2  . TURMERIC PO Take 1 tablet by mouth daily.    . Vitamin D, Ergocalciferol, (DRISDOL) 1.25 MG (50000 UNIT) CAPS capsule Take 1 capsule (50,000 Units total) by mouth every 7 (seven) days. 12 capsule 0   No current facility-administered medications on file prior to visit.    Past Medical  History:  Diagnosis Date  . Arthritis   . Bursitis    left elbow  . Clostridium difficile infection   . Frequent falls   . HLD (hyperlipidemia)   . Hypertension   . Infectious colitis     Past Surgical History:  Procedure Laterality Date  . CATARACT EXTRACTION Bilateral   . COLONOSCOPY    . ORIF PROXIMAL TIBIAL PLATEAU FRACTURE Left 02/19/2012   knee  . POLYPECTOMY    . TONSILLECTOMY      Social History   Socioeconomic History  . Marital status: Divorced    Spouse name: Not on file  . Number of children: 2  . Years of education: Not on file  . Highest education level: Not on file  Occupational History  . Occupation: RETIRED  Tobacco Use  . Smoking status: Former Smoker    Packs/day: 0.25    Years: 50.00    Pack years: 12.50    Types: Cigarettes    Quit date: 08/28/2014    Years since quitting: 5.2  . Smokeless tobacco: Never Used  . Tobacco comment: 1 pack every 3 days  Vaping Use  . Vaping Use: Never used  Substance and Sexual Activity  . Alcohol use: No    Alcohol/week: 0.0 standard drinks  . Drug use: No  . Sexual activity: Not Currently  Other Topics Concern  . Not on file  Social History Narrative   Lives alone has 2 supportive children.      Social Determinants of Health   Financial Resource Strain:   . Difficulty of Paying Living Expenses: Not on file  Food Insecurity:   . Worried About Charity fundraiser in the Last Year: Not on file  . Ran Out of Food in the Last Year: Not on file  Transportation Needs:   . Lack of Transportation (Medical): Not on file  . Lack of Transportation (Non-Medical): Not on file  Physical Activity:   . Days of Exercise per Week: Not on file  . Minutes of Exercise per Session: Not on file  Stress:   . Feeling of Stress : Not on file  Social Connections:   . Frequency of Communication with Friends and Family: Not on file  . Frequency of Social Gatherings with Friends and Family: Not on file  . Attends Religious  Services: Not on file  . Active Member of Clubs or Organizations: Not on file  . Attends Archivist Meetings: Not on file  . Marital Status: Not on file    Family History  Problem Relation Age of Onset  . Diverticulosis Mother   . Ovarian cancer Mother   . Pancreatic cancer Father   . Lung cancer Father   . Colon polyps Sister   . Colon cancer Neg Hx     Review of Systems  Constitutional:  Positive for appetite change. Negative for fever.  Respiratory: Negative for cough, shortness of breath and wheezing.   Cardiovascular: Negative for chest pain and palpitations.  Gastrointestinal: Positive for abdominal pain and nausea. Negative for blood in stool, constipation and diarrhea.       Reflux - two nights  Genitourinary: Negative for dysuria, frequency and hematuria.       Objective:   Vitals:   11/08/19 1128  BP: 138/76  Pulse: 97  Temp: 98.4 F (36.9 C)  SpO2: 97%   BP Readings from Last 3 Encounters:  11/08/19 138/76  11/05/19 130/80  10/17/19 138/77   Wt Readings from Last 3 Encounters:  11/08/19 166 lb (75.3 kg)  11/05/19 169 lb (76.7 kg)  07/12/19 168 lb (76.2 kg)   Body mass index is 28.49 kg/m.   Physical Exam Constitutional:      General: She is not in acute distress.    Appearance: Normal appearance. She is not ill-appearing.  HENT:     Head: Normocephalic and atraumatic.  Abdominal:     General: Bowel sounds are normal. There is no distension.     Palpations: Abdomen is soft. There is no mass.     Tenderness: There is abdominal tenderness (RLQ). There is rebound. There is no guarding.  Skin:    General: Skin is warm and dry.  Neurological:     Mental Status: She is alert.            Assessment & Plan:    See Problem List for Assessment and Plan of chronic medical problems.    This visit occurred during the SARS-CoV-2 public health emergency.  Safety protocols were in place, including screening questions prior to the visit,  additional usage of staff PPE, and extensive cleaning of exam room while observing appropriate contact time as indicated for disinfecting solutions.

## 2019-11-08 ENCOUNTER — Telehealth: Payer: Self-pay

## 2019-11-08 ENCOUNTER — Other Ambulatory Visit: Payer: Self-pay | Admitting: Internal Medicine

## 2019-11-08 ENCOUNTER — Other Ambulatory Visit (INDEPENDENT_AMBULATORY_CARE_PROVIDER_SITE_OTHER): Payer: Medicare Other

## 2019-11-08 ENCOUNTER — Other Ambulatory Visit: Payer: Self-pay | Admitting: *Deleted

## 2019-11-08 ENCOUNTER — Other Ambulatory Visit: Payer: Self-pay

## 2019-11-08 ENCOUNTER — Ambulatory Visit (INDEPENDENT_AMBULATORY_CARE_PROVIDER_SITE_OTHER): Payer: Medicare Other | Admitting: Internal Medicine

## 2019-11-08 ENCOUNTER — Ambulatory Visit (INDEPENDENT_AMBULATORY_CARE_PROVIDER_SITE_OTHER)
Admission: RE | Admit: 2019-11-08 | Discharge: 2019-11-08 | Disposition: A | Discharge status: Home / Self Care | Payer: Medicare Other | Source: Ambulatory Visit | Attending: Internal Medicine | Admitting: Internal Medicine

## 2019-11-08 ENCOUNTER — Encounter: Payer: Self-pay | Admitting: Internal Medicine

## 2019-11-08 DIAGNOSIS — I10 Essential (primary) hypertension: Secondary | ICD-10-CM | POA: Diagnosis not present

## 2019-11-08 DIAGNOSIS — R1031 Right lower quadrant pain: Secondary | ICD-10-CM | POA: Diagnosis not present

## 2019-11-08 DIAGNOSIS — K862 Cyst of pancreas: Secondary | ICD-10-CM

## 2019-11-08 DIAGNOSIS — K760 Fatty (change of) liver, not elsewhere classified: Secondary | ICD-10-CM | POA: Diagnosis not present

## 2019-11-08 DIAGNOSIS — R109 Unspecified abdominal pain: Secondary | ICD-10-CM | POA: Diagnosis not present

## 2019-11-08 DIAGNOSIS — K8689 Other specified diseases of pancreas: Secondary | ICD-10-CM | POA: Insufficient documentation

## 2019-11-08 LAB — CBC WITH DIFFERENTIAL/PLATELET
Basophils Absolute: 0.1 10*3/uL (ref 0.0–0.1)
Basophils Relative: 1.2 % (ref 0.0–3.0)
Eosinophils Absolute: 0.1 10*3/uL (ref 0.0–0.7)
Eosinophils Relative: 0.9 % (ref 0.0–5.0)
HCT: 41.2 % (ref 36.0–46.0)
Hemoglobin: 13.4 g/dL (ref 12.0–15.0)
Lymphocytes Relative: 34.4 % (ref 12.0–46.0)
Lymphs Abs: 2.8 10*3/uL (ref 0.7–4.0)
MCHC: 32.5 g/dL (ref 30.0–36.0)
MCV: 82.1 fl (ref 78.0–100.0)
Monocytes Absolute: 0.8 10*3/uL (ref 0.1–1.0)
Monocytes Relative: 9.5 % (ref 3.0–12.0)
Neutro Abs: 4.5 10*3/uL (ref 1.4–7.7)
Neutrophils Relative %: 54 % (ref 43.0–77.0)
Platelets: 313 10*3/uL (ref 150.0–400.0)
RBC: 5.03 Mil/uL (ref 3.87–5.11)
RDW: 15.5 % (ref 11.5–15.5)
WBC: 8.3 10*3/uL (ref 4.0–10.5)

## 2019-11-08 LAB — COMPREHENSIVE METABOLIC PANEL
ALT: 25 U/L (ref 0–35)
AST: 19 U/L (ref 0–37)
Albumin: 4.4 g/dL (ref 3.5–5.2)
Alkaline Phosphatase: 42 U/L (ref 39–117)
BUN: 21 mg/dL (ref 6–23)
CO2: 30 mEq/L (ref 19–32)
Calcium: 9.7 mg/dL (ref 8.4–10.5)
Chloride: 101 mEq/L (ref 96–112)
Creatinine, Ser: 0.89 mg/dL (ref 0.40–1.20)
GFR: 63.11 mL/min (ref >60.00)
Glucose, Bld: 90 mg/dL (ref 70–99)
Potassium: 3.8 mEq/L (ref 3.5–5.1)
Sodium: 140 mEq/L (ref 135–145)
Total Bilirubin: 0.6 mg/dL (ref 0.2–1.2)
Total Protein: 7.6 g/dL (ref 6.0–8.3)

## 2019-11-08 MED ORDER — IOHEXOL 300 MG/ML  SOLN
100.0000 mL | Freq: Once | INTRAMUSCULAR | Status: AC | PRN
  Administered 2019-11-08: 100 mL via INTRAVENOUS

## 2019-11-08 NOTE — Assessment & Plan Note (Signed)
Chronic BP well controlled Continue amlodipine 7.5 mg daily, hydrochlorothiazide 25 mg daily, lisinopril 40 mg daily

## 2019-11-08 NOTE — Patient Instructions (Signed)
  Blood work was ordered.     A ct scan was ordered.

## 2019-11-08 NOTE — Patient Outreach (Signed)
[facility]) Care Management  11/08/2019  Kristina Ayala December 23, 1945 [id number]  [facility] Telephone Assessment/Screen for Nurse call center referral  Referral Date: 11/06/19 Referral Source: Kristina Ayala, Registered Nurse Advocate, Kristina Ayala, Nurse call center Referral Reason: Patient is experiencing upper ABD pain and c/o gastric fluid that she states is not reflux. Protocol Used: Upper ABD Pain Adult Protocol Based Disposition: See HCP within 2 weeks. (Patient has an appointment for Thursday AM) Positive Triage Questions: ABD pain occurs about one hour after meals and mainly at night. Mild pain that does not interfere with normal activities and comes and goes and has been present > 72 hours. All higher acuity triage questions were negative. Care advise discussed: Keep MD appointment for Thursday AM-Reasons to call back: Call MD if pain becomes worse and see if MD can move appointment up or go to the ED.   Insurance: [employer]   Outreach attempt # 1 No answer. [facility] RN CM left HIPAA [facility] Portability and Accountability Act) compliant voicemail message along with CM's contact info.   Plan: [facility] RN CM sent an unsuccessful outreach letter and scheduled this patient for another call attempt within 4 business days   Kristina Kerwin L. Lavina Hamman, RN, BSN, [place] Coordinator Office number [phone] Mobile number [phone]  Main [facility] number [phone] Fax number [phone]

## 2019-11-08 NOTE — Assessment & Plan Note (Signed)
Acute 3 days of tenderness in right side of abdomen-focused in the right lower abdomen She does have rebound in the right lower quadrant is quite tender-concerning for possible appendicitis She does have a history of a right ovarian cyst, which could also be the cause of the pain No change in bowels or urinary symptoms CBC, CMP CT of the abdomen and pelvis today Further treatment depending on above

## 2019-11-08 NOTE — Addendum Note (Signed)
Addended by: [name] on: 11/08/2019 08:55 PM   Modules accepted: Orders

## 2019-11-08 NOTE — Telephone Encounter (Signed)
IMPRESSION: 1. There is an area of fat stranding in the right upper quadrant adjacent to the transverse colon. This could represent a small area of omental infarct or epiploic appendagitis. 2. 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 There is a questionable small hypoattenuating lesion at the pancreatic head measuring approximately 0.9 cm. Follow-up with a nonemergent outpatient contrast enhanced MRI is recommended for further evaluation of this finding. 3. Hepatic steatosis. 4. Slight interval increase in size of a cystic mass involving the right ovary currently measuring up to 5.6 cm (previously measuring 5.4 x 3.6 cm). Outpatient gynecology follow-up is recommended for this finding.

## 2019-11-09 MED ORDER — TRAMADOL HCL 50 MG PO TABS: 50.0000 mg | ORAL_TABLET | Freq: Four times a day (QID) | ORAL | 0 refills | Status: DC | PRN

## 2019-11-09 NOTE — Telephone Encounter (Signed)
Notified pt that she has been referred for an MRI to look more closely at the pancreatic mass; she will hear from them to make an appt.  She denies pain at this time & declines offer for pain meds.  She is aware that she may call the office if any changes or concerns.

## 2019-11-09 NOTE — Telephone Encounter (Signed)
Patient called and was wondering if there was anyway that she could get a call back. She was wondering if Dr. Ronnald Ramp would give her anything for the pain. She said there was still swelling and in a lot of pain.   Please call the patient back: [phone]

## 2019-11-09 NOTE — Addendum Note (Signed)
Addended by: Biagio Borg on: 11/09/2019 03:10 PM   Modules accepted: Orders

## 2019-11-09 NOTE — Telephone Encounter (Signed)
Notified pt to let her know that Dr Quay Burow tried to call her last night, but she did not answer. Ilet her know in her right upper abdomen she has what looks like an area of the fat that overlies the intestines that is not getting enough blood flow. This is benign and typically does not need any treatment - it should go away after a week or so. That is likely the cause of her pain. She can take tylenol for the pain. Other findings - There is mild dilation of her pancreatic duct and a possible cyst or lesion in her pancreas that we need to evaluate further with an MRI which I will order. The right ovarian cyst looks larger - she needs to see a gyn.  Pt states she does have a GYN that has been trying to call her to make an appt, so she will call them to make appt & let them know of the noted right ovarian cyst. Pt is able to repeat back findings & instructions.

## 2019-11-09 NOTE — Telephone Encounter (Signed)
I tried to send some tramadol, but not sure if it went through

## 2019-11-09 NOTE — Telephone Encounter (Signed)
Pt notified that re: tramadol prescription with instructions on how to take.  Pt also c/o diarrhea since taking contrast for CT scan; advised take an OTC antidiarrhetic.  Pt verb understanding.

## 2019-11-12 ENCOUNTER — Telehealth: Payer: Self-pay | Admitting: Internal Medicine

## 2019-11-12 MED ORDER — DIPHENOXYLATE-ATROPINE 2.5-0.025 MG PO TABS: 1.0000 | ORAL_TABLET | Freq: Four times a day (QID) | ORAL | 0 refills | Status: DC | PRN

## 2019-11-12 NOTE — Telephone Encounter (Signed)
    Patient requesting medication be prescribed for diarrhea. Patient states the diarrhea is caused from the contrast from CT scan. She has been trying OTC medication, but has not stopped the issue  Please call

## 2019-11-12 NOTE — Telephone Encounter (Signed)
A prescription was sent to pharmacy.  If diarrhea continues she needs to let us know

## 2019-11-13 ENCOUNTER — Ambulatory Visit: Payer: Medicare Other | Admitting: Family Medicine

## 2019-11-13 ENCOUNTER — Other Ambulatory Visit: Payer: Self-pay

## 2019-11-13 ENCOUNTER — Encounter: Payer: Self-pay | Admitting: Family Medicine

## 2019-11-13 DIAGNOSIS — M5416 Radiculopathy, lumbar region: Secondary | ICD-10-CM

## 2019-11-13 DIAGNOSIS — K8689 Other specified diseases of pancreas: Secondary | ICD-10-CM

## 2019-11-13 DIAGNOSIS — M1712 Unilateral primary osteoarthritis, left knee: Secondary | ICD-10-CM | POA: Diagnosis not present

## 2019-11-13 NOTE — Assessment & Plan Note (Signed)
Lumbar radiculopathy, still respond somewhat to the epidurals but this time was very short-lived.  Encouraged her to consider it again if necessary.  Has Zanaflex for breakthrough.  Patient has multiple other comorbidities at this time but also could be contributing encourage her to take control of the rest of her health.  Patient will call us if she wants an epidural otherwise follow-up again in 3 to 4 months

## 2019-11-13 NOTE — Patient Instructions (Signed)
Consider brace Back acts up call for epidural Go thru with other imaging studies See me in 3-4 months

## 2019-11-13 NOTE — Telephone Encounter (Signed)
Message left for patient regarding refill and to follow up if diarrhea continued.

## 2019-11-13 NOTE — Progress Notes (Signed)
[address] [place] Phone: [phone] Subjective:   [name], am serving as a scribe for Dr. Hulan Saas. This visit occurred during the SARS-CoV-2 public health emergency.  Safety protocols were in place, including screening questions prior to the visit, additional usage of staff PPE, and extensive cleaning of exam room while observing appropriate contact time as indicated for disinfecting solutions.   I'm seeing this patient by the request  of:  [name], MD  CC: Back pain, knee pain follow-up  VPX:[id number]   07/12/2019 Known arthritic changes.  Patient has had multiple injections previously but now having unfortunately increasing instability ever since her most recent fall.  Injections by previous provider did not seem to be helpful.  I do believe with patient having a history of a tibial plateau fracture a stress fracture or a OCD could be potentially missed and I do feel an MRI is necessary.  Patient will consider the possibility of surgical intervention if necessary.  Follow-up again after imaging to discuss further treatment options.   Update 11/13/2019 Kristina Ayala is a 74 y.o. female coming in with complaint of left knee pain and hip pain. Epidural 10/17/2019. Injection did not last but a few days as she was very active after the injection  Patient states that left knee is giving out.  Patient states that it comes and goes.  Sometimes can be severe.  Does not want to use a cane or a brace.  Wants to know what else can be done.  Does not want any surgical intervention.   IMPRESSION: Despite using artifact reduction techniques, the examination is markedly limited. No acute or focal abnormality is identified. The menisci are obscured. As visualized, ligamentous structures appear intact. Mild appearing osteoarthritis about the knee is noted.       Past Medical History:  Diagnosis Date  .  Arthritis   . Bursitis    left elbow  . Clostridium difficile infection   . Frequent falls   . HLD (hyperlipidemia)   . Hypertension   . Infectious colitis    Past Surgical History:  Procedure Laterality Date  . CATARACT EXTRACTION Bilateral   . COLONOSCOPY    . ORIF PROXIMAL TIBIAL PLATEAU FRACTURE Left 02/19/2012   knee  . POLYPECTOMY    . TONSILLECTOMY     Social History   Socioeconomic History  . Marital status: Divorced    Spouse name: Not on file  . Number of children: 2  . Years of education: Not on file  . Highest education level: Not on file  Occupational History  . Occupation: RETIRED  Tobacco Use  . Smoking status: Former Smoker    Packs/day: 0.25    Years: 50.00    Pack years: 12.50    Types: Cigarettes    Quit date: 08/28/2014    Years since quitting: 5.2  . Smokeless tobacco: Never Used  . Tobacco comment: 1 pack every 3 days  Vaping Use  . Vaping Use: Never used  Substance and Sexual Activity  . Alcohol use: No    Alcohol/week: 0.0 standard drinks  . Drug use: No  . Sexual activity: Not Currently  Other Topics Concern  . Not on file  Social History Narrative   Lives alone has 2 supportive children.      Social Determinants of Health   Financial Resource Strain:   . Difficulty of Paying Living Expenses: Not on file  Food  Insecurity:   . Worried About Charity fundraiser in the Last Year: Not on file  . Ran Out of Food in the Last Year: Not on file  Transportation Needs:   . Lack of Transportation (Medical): Not on file  . Lack of Transportation (Non-Medical): Not on file  Physical Activity:   . Days of Exercise per Week: Not on file  . Minutes of Exercise per Session: Not on file  Stress:   . Feeling of Stress : Not on file  Social Connections:   . Frequency of Communication with Friends and Family: Not on file  . Frequency of Social Gatherings with Friends and Family: Not on file  . Attends Religious Services: Not on file  . Active  Member of Clubs or Organizations: Not on file  . Attends Archivist Meetings: Not on file  . Marital Status: Not on file   Allergies  Allergen Reactions  . Doxycycline Diarrhea  . Minocycline Diarrhea  . Wellbutrin [Bupropion] Swelling    Per pt her tongue was swollen and sore/symptoms stopped once the medication was discontinued.   [name] Penicillins Rash    Has patient had a PCN reaction causing immediate rash, facial/tongue/throat swelling, SOB or lightheadedness with hypotension: yes Has patient had a PCN reaction causing severe rash involving mucus membranes or skin necrosis: no Has patient had a PCN reaction that required hospitalization: unknown Has patient had a PCN reaction occurring within the last 10 years: no If all of the above answers are "NO", then may proceed with Cephalosporin use.    Family History  Problem Relation Age of Onset  . Diverticulosis Mother   . Ovarian cancer Mother   . Pancreatic cancer Father   . Lung cancer Father   . Colon polyps Sister   . Colon cancer Neg Hx     Current Outpatient Medications (Endocrine & Metabolic):  .  denosumab (PROLIA) 60 MG/ML SOSY injection, INJECT 60 MG INTO THE SKIN ONCE FOR 1 DOSE  Current Outpatient Medications (Cardiovascular):  .  amLODipine (NORVASC) 5 MG tablet, Take 1.5 tablets (7.5 mg total) by mouth daily. [name]  atorvastatin (LIPITOR) 40 MG tablet, Take 1 tablet (40 mg total) by mouth every other day. .  hydrochlorothiazide (HYDRODIURIL) 25 MG tablet, TAKE 1 TABLET(25 MG) BY MOUTH EVERY MORNING .  lisinopril (ZESTRIL) 40 MG tablet, TAKE 1 TABLET(40 MG) BY MOUTH DAILY   Current Outpatient Medications (Analgesics):  .  aspirin EC 81 MG tablet, Take 81 mg by mouth daily. .  meloxicam (MOBIC) 7.5 MG tablet, Take 1 tablet (7.5 mg total) by mouth daily. .  traMADol (ULTRAM) 50 MG tablet, Take 1 tablet (50 mg total) by mouth every 6 (six) hours as needed.  Current Outpatient Medications (Hematological):  [name]   Cyanocobalamin (VITAMIN B-12) 2500 MCG SUBL, Place 1 tablet under the tongue daily. .  ferrous sulfate 325 (65 FE) MG tablet, Take 1 tablet (325 mg total) by mouth daily with breakfast.  Current Outpatient Medications (Other):  [name]  ALPRAZolam (XANAX) 0.5 MG tablet, TAKE 1 TABLET(0.5 MG) BY MOUTH AT BEDTIME AS NEEDED FOR ANXIETY .  Cholecalciferol (VITAMIN D3) 10000 units TABS, Take 1 tablet by mouth once a week.  .  diphenoxylate-atropine (LOMOTIL) 2.5-0.025 MG tablet, Take 1 tablet by mouth 4 (four) times daily as needed for diarrhea or loose stools. .  gabapentin (NEURONTIN) 400 MG capsule, Take 1 capsule (400 mg total) by mouth at bedtime. .  Magnesium 100 MG CAPS, Take  100 mg by mouth daily. .  potassium chloride (KLOR-CON) 8 MEQ tablet, TAKE 2 TABLETS(16 MEQ) BY MOUTH DAILY .  tiZANidine (ZANAFLEX) 4 MG tablet, TAKE 1 TABLET(4 MG) BY MOUTH DAILY AS NEEDED FOR MUSCLE SPASMS .  TURMERIC PO, Take 1 tablet by mouth daily. .  Vitamin D, Ergocalciferol, (DRISDOL) 1.25 MG (50000 UNIT) CAPS capsule, Take 1 capsule (50,000 Units total) by mouth every 7 (seven) days.   Reviewed prior external information including notes and imaging from  primary care provider As well as notes that were available from care everywhere and other healthcare systems.  Past medical history, social, surgical and family history all reviewed in electronic medical record.  No pertanent information unless stated regarding to the chief complaint.   Review of Systems:  No headache, visual changes, nausea, vomiting, diarrhea, constipation, dizziness, abdominal pain, skin rash, fevers, chills, night sweats, weight loss, swollen lymph nodes,  joint swelling, chest pain, shortness of breath, mood changes. POSITIVE muscle aches, body aches  Objective  Blood pressure 122/82, pulse 91, height 5\' 4"  (1.626 m), weight 167 lb (75.8 kg), SpO2 99 %.   General: No apparent distress alert and oriented x3 mood and affect normal, dressed  appropriately.  HEENT: Pupils equal, extraocular movements intact  Respiratory: Patient's speak in full sentences and does not appear short of breath  Cardiovascular: No lower extremity edema, non tender, no erythema  Gait antalgic   MSK: Left knee does have some very mild instability with valgus and varus force.  Mild crepitus with range of motion.  Tender to palpation over the medial joint line.  Lacks last 5 degrees of flexion.  Low back exam does have some loss of lordosis.  Tenderness to palpation diffusely. No spinous process tenderness.  Tightness with straight leg test bilaterally.    Impression and Recommendations:     The above documentation has been reviewed and is accurate and complete Lyndal Pulley, DO

## 2019-11-13 NOTE — Assessment & Plan Note (Signed)
History of the tibial plateau fracture.  Patient MRI was inconclusive secondary to artifact.  Patient does have instability but it will not use a cane or a brace.  I do think that these would be beneficial.  Patient states that she would like to talk about it again in 3 to 4 months.  Patient would not want to do any surgical intervention at this time.  Increase activity slowly.  Follow-up again 3 to 4 months

## 2019-11-13 NOTE — Assessment & Plan Note (Signed)
MRI has been ordered for November 7 encourage her to keep this appointment.

## 2019-11-15 ENCOUNTER — Ambulatory Visit: Payer: Self-pay | Admitting: *Deleted

## 2019-11-19 ENCOUNTER — Other Ambulatory Visit: Payer: Self-pay | Admitting: *Deleted

## 2019-11-19 NOTE — Patient Outreach (Addendum)
[facility]) Care Management  11/19/2019  Kristina Ayala 1945-05-06 [id number]   [facility] Telephone Assessment/Screen for Nurse call center referral  Referral Date: 11/06/19 Referral Source: Joycelyn Rua, Registered Nurse Advocate, [place], Nurse call center Referral Reason: Patient is experiencing upper ABD pain and c/o gastric fluid that she states is not reflux. Protocol Used: Upper ABD Pain Adult Protocol Based Disposition: See HCP within 2 weeks. (Patient has an appointment for Thursday AM) Positive Triage Questions: ABD pain occurs about one hour after meals and mainly at night. Mild pain that does not interfere with normal activities and comes and goes and has been present > 72 hours. All higher acuity triage questions were negative. Care advise discussed: Keep MD appointment for Thursday AM-Reasons to call back: Call MD if pain becomes worse and see if MD can move appointment up or go to the ED.   Insurance: [employer]   Outreach attempt # 2 No answer. [facility] RN CM left HIPAA [facility] Portability and Accountability Act) compliant voicemail message along with CM's contact info.   Notes reviewed in Epic indicates patient has followed up with her MDs/RNs, tests and treatments are being offered.  Plan: [facility] RN CM scheduled this patient for a third outreach call within 4 -7 business days [facility] RN CM left voice messages for patient on 11/08/19 and 11/19/19 [facility] RN CM sent an unsuccessful outreach letter on 11/08/19  Routed note to MDs Quay Burow, [place] Lavina Hamman, RN, BSN, [place] Coordinator Office number [phone] Mobile number [phone]  Main [facility] number [phone] Fax number [phone]

## 2019-11-23 ENCOUNTER — Other Ambulatory Visit: Payer: Self-pay | Admitting: *Deleted

## 2019-11-23 NOTE — Patient Outreach (Signed)
[facility]) Care Management  11/23/2019  KATE LAROCK [birth year] [id number]   [facility] Telephone Assessment/Screen for Nurse call center referral  Referral Date:11/06/19 Referral Source:Kaye Alyse Low, Registered Nurse [place], Nurse call center Referral Reason:Patient is experiencing upper ABD pain and c/o gastric fluid that she states is not reflux. Protocol Used: Upper ABD Pain Adult Protocol Based Disposition: See HCP within 2 weeks. (Patient has an appointment for Thursday AM) Positive Triage Questions: ABD pain occurs about one hour after meals and mainly at night. Mild pain that does not interfere with normal activities and comes and goes and has been present > 72 hours. All higher acuity triage questions were negative. Care advise discussed: Keep MD appointment for Thursday AM-Reasons to call back: Call MD if pain becomes worse and see if MD can move appointment up or go to the ED.   Insurance:[employer] medicare  Outreach attempt # 3 No answer. [facility] RN CM left HIPAA [facility] Portability and Accountability Act) compliant voicemail message along with CM's contact info.   Notes reviewed in Epic indicates patient has followed up with her MDs/RNs, tests and treatments are being offered.  Plan: [facility] RN CM scheduled this patient for an outreach call in 3 weeks [facility] RN CM left voice messages for patient on 11/08/19, 11/19/19 and 11/23/19 [facility] RN CM sent an unsuccessful outreach letter on 11/08/19  Routed note to MDs Quay Burow, [place] Lavina Hamman, RN, BSN, [place] Coordinator Office number [phone] Main [facility] number [phone] Fax number [phone]

## 2019-11-27 ENCOUNTER — Ambulatory Visit (INDEPENDENT_AMBULATORY_CARE_PROVIDER_SITE_OTHER): Payer: Medicare Other | Admitting: Family Medicine

## 2019-11-27 ENCOUNTER — Encounter: Payer: Self-pay | Admitting: Family Medicine

## 2019-11-27 ENCOUNTER — Other Ambulatory Visit: Payer: Self-pay

## 2019-11-27 ENCOUNTER — Ambulatory Visit: Payer: Self-pay

## 2019-11-27 VITALS — BP 136/76 | HR 101 | Ht 64.0 in | Wt 168.8 lb

## 2019-11-27 DIAGNOSIS — G8929 Other chronic pain: Secondary | ICD-10-CM

## 2019-11-27 DIAGNOSIS — M23301 Other meniscus derangements, unspecified lateral meniscus, left knee: Secondary | ICD-10-CM

## 2019-11-27 DIAGNOSIS — M1732 Unilateral post-traumatic osteoarthritis, left knee: Secondary | ICD-10-CM | POA: Diagnosis not present

## 2019-11-27 DIAGNOSIS — M25562 Pain in left knee: Secondary | ICD-10-CM

## 2019-11-27 NOTE — Progress Notes (Signed)
I, Wendy Poet, LAT, ATC, am serving as scribe for Dr. [name].  Kristina Ayala is a 74 y.o. female who presents to [facility] today for f/u of chronic L knee pain.  She has been seen by Dr. Tamala Julian multiple times for chronic LBP and chronic L knee pain, most recently on 11/13/19 and on 07/12/19.  She has had multiple prior L knee injections from a prior provider w/ little benefit.  Since her last visit w/ Dr. Tamala Julian on 11/13/19, pt reports significant left knee pn, got worse 11/26/19, and just gave out on her when she was at the store. No LBP reported. Antalgic gait and trouble going from sitting to standing. Aggravates: standing and walking. Pt reports stress with her living environment (she set off the emergency alarm, tiles coming up in the bathroom etc).  Diagnostic imaging: L knee MRI- 08/23/19; L knee XR- 06/07/19   Pertinent review of systems: No fevers or chills  Relevant historical information: History left knee tibial fracture with intact surgical hardware   Exam:  BP 136/76 (BP Location: Left Arm, Patient Position: Sitting, Cuff Size: Normal)    Pulse (!) 101    Ht 5\' 4"  (1.626 m)    Wt 168 lb 12.8 oz (76.6 kg)    SpO2 98%    BMI 28.97 kg/m  General: Well Developed, well nourished, and in no acute distress.   MSK: Left knee mature scar anterior lateral knee.  Normal motion.  Palpable click with some tenderness at lateral malleolus especially in the extension.  Stable ligamentous exam positive lateral McMurray's test.  Intact strength.    Lab and Radiology Results  Procedure: Real-time Ultrasound Guided Injection of left knee superior lateral patellar space Device: Philips Affiniti 50G Images permanently stored and available for review in PACS Ultrasound inspection of knee prior to injection reveals lateral meniscus popping out of the lateral joint with flexion of the knee.  This is the area of tenderness and palpable click felt with knee  extension. Verbal informed consent obtained.  Discussed risks and benefits of procedure. Warned about infection bleeding damage to structures skin hypopigmentation and fat atrophy among others. Patient expresses understanding and agreement Time-out conducted.   Noted no overlying erythema, induration, or other signs of local infection.   Skin prepped in a sterile fashion.   Local anesthesia: Topical Ethyl chloride.   With sterile technique and under real time ultrasound guidance:  40 mg of Kenalog and 2 mL of Marcaine injected into joint. Fluid seen entering the joint capsule.   Completed without difficulty   Pain immediately resolved suggesting accurate placement of the medication.   Advised to call if fevers/chills, erythema, induration, drainage, or persistent bleeding.   Images permanently stored and available for review in the ultrasound unit.  Impression: Technically successful ultrasound guided injection.   EXAM: LEFT KNEE - 3 VIEW  COMPARISON:  01/31/2018  FINDINGS: Stable proximal tibial fixation hardware and associated probable injected methylmethacrylate in the lateral tibial plateau. Small effusion with an interval decrease in size. Stable mild patellofemoral degenerative changes and mild to moderate lateral compartment degenerative changes. No acute fracture or dislocation seen.  IMPRESSION: 1. No acute fracture or dislocation. 2. Small effusion, decreased.   Electronically Signed   By: Claudie Revering M.D.   On: 06/08/2019 09:15 I, [name], personally (independently) visualized and performed the interpretation of the images attached in this note.       Assessment and Plan: 74  y.o. female with left knee pain acute exacerbation of chronic pain.  Pain predominantly due to lateral meniscus tear.  I believe a piece of the lateral meniscus is impinging and causing pain.  I am hopeful that the steroid injection will help calm it down and produce less pain and  allow her to resume normal activities.  However discussed with patient that should she not have significant benefit she may be a good candidate for arthroscopic surgical debridement of the meniscus and removal of loose body and eventually of total knee replacement.  Additionally discussed the risk of steroid injection including blood sugar elevation and infection especially for surgical hardware.  Feel that the benefit of injection is worth the risk.   PDMP not reviewed this encounter. Orders Placed This Encounter  Procedures   [place] LIMITED JOINT SPACE STRUCTURES LOW LEFT(NO LINKED CHARGES)    Order Specific Question:   Reason for Exam (SYMPTOM  OR DIAGNOSIS REQUIRED)    Answer:   knee pain    Order Specific Question:   Preferred imaging location?    Answer:   [place]   No orders of the defined types were placed in this encounter.    Discussed warning signs or symptoms. Please see discharge instructions. Patient expresses understanding.   The above documentation has been reviewed and is accurate and complete [name], M.D.

## 2019-11-27 NOTE — Patient Instructions (Signed)
Thank you for coming in today.  Plan for injection today.  I think the reason you hurt is because of a torn meniscus.  If not better let me know.  Ok to resume water aerobic as soon as Architectural technologist.

## 2019-12-02 ENCOUNTER — Ambulatory Visit
Admission: RE | Admit: 2019-12-02 | Discharge: 2019-12-02 | Disposition: A | Discharge status: Home / Self Care | Payer: Medicare Other | Source: Ambulatory Visit | Attending: Internal Medicine | Admitting: Internal Medicine

## 2019-12-02 DIAGNOSIS — K760 Fatty (change of) liver, not elsewhere classified: Secondary | ICD-10-CM | POA: Diagnosis not present

## 2019-12-02 DIAGNOSIS — K8689 Other specified diseases of pancreas: Secondary | ICD-10-CM

## 2019-12-02 DIAGNOSIS — K862 Cyst of pancreas: Secondary | ICD-10-CM | POA: Diagnosis not present

## 2019-12-02 MED ORDER — GADOBENATE DIMEGLUMINE 529 MG/ML IV SOLN
15.0000 mL | Freq: Once | INTRAVENOUS | Status: AC | PRN
  Administered 2019-12-02: 15 mL via INTRAVENOUS

## 2019-12-04 ENCOUNTER — Encounter: Payer: Self-pay | Admitting: Internal Medicine

## 2019-12-04 ENCOUNTER — Telehealth (INDEPENDENT_AMBULATORY_CARE_PROVIDER_SITE_OTHER): Payer: Medicare Other | Admitting: Internal Medicine

## 2019-12-04 DIAGNOSIS — R059 Cough, unspecified: Secondary | ICD-10-CM | POA: Insufficient documentation

## 2019-12-04 NOTE — Assessment & Plan Note (Signed)
Acute Occurs only at night when she goes to lay down No symptoms to suggest an infection Most likely GERD or allergy / PND related Discussed treatment of both causes Advised to try allergy medication and pepcid otc at night She will let me know if symptoms do not improve

## 2019-12-04 NOTE — Progress Notes (Signed)
Virtual Visit via Telephone Note  I connected with Kristina Ayala on 12/04/19 at  3:15 PM EST by telephone and verified that I am speaking with the correct person using two identifiers.   I discussed the limitations of evaluation and management by telemedicine and the availability of in person appointments. The patient expressed understanding and agreed to proceed.  Present for the visit:  Myself, Dr Billey Gosling, Oda Cogan.  The patient is currently at home and I am in the office.    No referring provider.    History of Present Illness: She is here for an acute visit for cough.    She is experiencing a dry cough.  It comes on during the evening mostly - when she lays down. No cough during day.   She denies GERD, nausea and burping. Eats dinner around 6pm.  occ a little snack.  She may have some allergy symptoms.    Review of Systems  Constitutional: Negative for fever.  HENT: Positive for sore throat (not in esopagus  - under chin). Negative for congestion, ear pain and sinus pain.   Respiratory: Positive for cough. Negative for shortness of breath and wheezing.   Neurological: Negative for headaches.      Social History   Socioeconomic History  . Marital status: Divorced    Spouse name: Not on file  . Number of children: 2  . Years of education: Not on file  . Highest education level: Not on file  Occupational History  . Occupation: RETIRED  Tobacco Use  . Smoking status: Former Smoker    Packs/day: 0.25    Years: 50.00    Pack years: 12.50    Types: Cigarettes    Quit date: 08/28/2014    Years since quitting: 5.2  . Smokeless tobacco: Never Used  . Tobacco comment: 1 pack every 3 days  Vaping Use  . Vaping Use: Never used  Substance and Sexual Activity  . Alcohol use: No    Alcohol/week: 0.0 standard drinks  . Drug use: No  . Sexual activity: Not Currently  Other Topics Concern  . Not on file  Social History Narrative   Lives alone has 2  supportive children.      Social Determinants of Health   Financial Resource Strain:   . Difficulty of Paying Living Expenses: Not on file  Food Insecurity:   . Worried About Charity fundraiser in the Last Year: Not on file  . Ran Out of Food in the Last Year: Not on file  Transportation Needs:   . Lack of Transportation (Medical): Not on file  . Lack of Transportation (Non-Medical): Not on file  Physical Activity:   . Days of Exercise per Week: Not on file  . Minutes of Exercise per Session: Not on file  Stress:   . Feeling of Stress : Not on file  Social Connections:   . Frequency of Communication with Friends and Family: Not on file  . Frequency of Social Gatherings with Friends and Family: Not on file  . Attends Religious Services: Not on file  . Active Member of Clubs or Organizations: Not on file  . Attends Archivist Meetings: Not on file  . Marital Status: Not on file     Observations/Objective:    Assessment and Plan:  See Problem List for Assessment and Plan of chronic medical problems.   Follow Up Instructions:    I discussed the assessment and treatment plan with the patient.  The patient was provided an opportunity to ask questions and all were answered. The patient agreed with the plan and demonstrated an understanding of the instructions.   The patient was advised to call back or seek an in-person evaluation if the symptoms worsen or if the condition fails to improve as anticipated.  Time spent on telephone call:  6 minutes  [name], MD

## 2019-12-14 ENCOUNTER — Other Ambulatory Visit: Payer: Self-pay

## 2019-12-14 ENCOUNTER — Encounter: Payer: Self-pay | Admitting: *Deleted

## 2019-12-14 ENCOUNTER — Other Ambulatory Visit: Payer: Self-pay | Admitting: *Deleted

## 2019-12-14 NOTE — Patient Outreach (Signed)
[facility]) Care Management  12/14/2019  Kristina Ayala November 10, 1945 [id number]   [facility] Telephone Assessment/Screen for Nurse call center referral  Referral Date:11/06/19 Referral Source:Kaye Alyse Low, Registered Nurse [place], Nurse call center Referral Reason:Patient is experiencing upper ABD pain and c/o gastric fluid that she states is not reflux. Protocol Used: Upper ABD Pain Adult Protocol Based Disposition: See HCP within 2 weeks. (Patient has an appointment for Thursday AM) Positive Triage Questions: ABD pain occurs about one hour after meals and mainly at night. Mild pain that does not interfere with normal activities and comes and goes and has been present > 72 hours. All higher acuity triage questions were negative. Care advise discussed: Keep MD appointment for Thursday AM-Reasons to call back: Call MD if pain becomes worse and see if MD can move appointment up or go to the ED.   Insurance:[employer] medicare  Outreach attempt #[id number] successful to [phone] Patient is able to verify HIPAA ([place] and Accountability Act) identifiers Reviewed and addressed the purpose of the call with the patient  Consent: [name] RN CM reviewed [facility] services with patient. Patient gave verbal consent for services.   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 She had followed up with her after primary care provider (PCP) on the Thursday after speaking with the nurse line staff She was referred to and has been seeing her OB GYN  She is going today for an ultrasound to check to see if the cyst has grown, etc  OB GYN informed her the cyst has been there in the last 10 years Reports she had an MRI recently  She had quit her job related to the CT results She reports she work at the [employer] in [employer]    Other ventilated concerns, offer of services/education and resolutions She also ventilated about some other social concerns that had occurred during the time of her abdominal pain Found out 12/13/19 there ar financial issues since October 2021 (funds coming out her bank account) She was offered [facility] SW services after reviewing all [facility] services but she states she has things handled with her bank staff  Operational issues with a mobile chair used to go up and down stairs Issues with Tile in bathroom Animal in kitchen Her cousin and son help with her mobile chair, tile and animal in the kitchen- Resolved  Offered EMMI materials on medical diagnoses but she states she is "okay"  Pt requests case closure  spiritual practices assists her to manage and cope   Plan:[facility] RN CM scheduled this patient for case closure as she prefers and offered for her to outreach to [facility] at any time she needs further assistance  Routed note to MDs Montez Morita L. Lavina Hamman, RN, BSN, [place] Coordinator Office number [phone] [facility] number [phone] Fax number [phone]

## 2019-12-21 ENCOUNTER — Other Ambulatory Visit: Payer: Self-pay | Admitting: Internal Medicine

## 2020-01-11 ENCOUNTER — Other Ambulatory Visit: Payer: Self-pay

## 2020-01-11 ENCOUNTER — Ambulatory Visit (INDEPENDENT_AMBULATORY_CARE_PROVIDER_SITE_OTHER): Payer: Medicare Other

## 2020-01-11 VITALS — BP 130/80 | HR 93 | Temp 97.7°F | Ht 64.0 in | Wt 163.8 lb

## 2020-01-11 DIAGNOSIS — Z0000 Encounter for general adult medical examination without abnormal findings: Secondary | ICD-10-CM

## 2020-01-11 NOTE — Patient Instructions (Signed)
Ms. Baumgarner , Thank you for taking time to come for your Medicare Wellness Visit. I appreciate your ongoing commitment to your health goals. Please review the following plan we discussed and let me know if I can assist you in the future.   Screening recommendations/referrals: Colonoscopy: 06/07/2012; due every 10 years (05/2022) Mammogram: 08/23/2019 Bone Density: 07/07/2018; due every 2 years Recommended yearly ophthalmology/optometry visit for glaucoma screening and checkup Recommended yearly dental visit for hygiene and checkup  Vaccinations: Influenza vaccine: 12/28/2019 Pneumococcal vaccine: up to date Tdap vaccine: 07/29/2014; due every 10 years Shingles vaccine: never done   Covid-19: up to date  Advanced directives: Advance directive discussed with you today. Even though you declined this today please call our office should you change your mind and we can give you the proper paperwork for you to fill out.  Conditions/risks identified: Yes; Reviewed health maintenance screenings with patient today and relevant education, vaccines, and/or referrals were provided. Please continue to do your personal lifestyle choices by: daily care of teeth and gums, regular physical activity (goal should be 5 days a week for 30 minutes), eat a healthy diet, avoid tobacco and drug use, limiting any alcohol intake, taking a low-dose aspirin (if not allergic or have been advised by your provider otherwise) and taking vitamins and minerals as recommended by your provider. Continue doing brain stimulating activities (puzzles, reading, adult coloring books, staying active) to keep memory sharp. Continue to eat heart healthy diet (full of fruits, vegetables, whole grains, lean protein, water--limit salt, fat, and sugar intake) and increase physical activity as tolerated.  Next appointment: Please schedule your next Medicare Wellness Visit with your Nurse Health Advisor in 1 year by calling [phone].   Preventive  Care 46 Years and Older, Female Preventive care refers to lifestyle choices and visits with your health care provider that can promote health and wellness. What does preventive care include?  A yearly physical exam. This is also called an annual well check.  Dental exams once or twice a year.  Routine eye exams. Ask your health care provider how often you should have your eyes checked.  Personal lifestyle choices, including:  Daily care of your teeth and gums.  Regular physical activity.  Eating a healthy diet.  Avoiding tobacco and drug use.  Limiting alcohol use.  Practicing safe sex.  Taking low-dose aspirin every day.  Taking vitamin and mineral supplements as recommended by your health care provider. What happens during an annual well check? The services and screenings done by your health care provider during your annual well check will depend on your age, overall health, lifestyle risk factors, and family history of disease. Counseling  Your health care provider may ask you questions about your:  Alcohol use.  Tobacco use.  Drug use.  Emotional well-being.  Home and relationship well-being.  Sexual activity.  Eating habits.  History of falls.  Memory and ability to understand (cognition).  Work and work Statistician.  Reproductive health. Screening  You may have the following tests or measurements:  Height, weight, and BMI.  Blood pressure.  Lipid and cholesterol levels. These may be checked every 5 years, or more frequently if you are over 13 years old.  Skin check.  Lung cancer screening. You may have this screening every year starting at age 24 if you have a 30-pack-year history of smoking and currently smoke or have quit within the past 15 years.  Fecal occult blood test (FOBT) of the stool. You may have this  test every year starting at age 76.  Flexible sigmoidoscopy or colonoscopy. You may have a sigmoidoscopy every 5 years or a  colonoscopy every 10 years starting at age 86.  Hepatitis C blood test.  Hepatitis B blood test.  Sexually transmitted disease (STD) testing.  Diabetes screening. This is done by checking your blood sugar (glucose) after you have not eaten for a while (fasting). You may have this done every 1-3 years.  Bone density scan. This is done to screen for osteoporosis. You may have this done starting at age 57.  Mammogram. This may be done every 1-2 years. Talk to your health care provider about how often you should have regular mammograms. Talk with your health care provider about your test results, treatment options, and if necessary, the need for more tests. Vaccines  Your health care provider may recommend certain vaccines, such as:  Influenza vaccine. This is recommended every year.  Tetanus, diphtheria, and acellular pertussis (Tdap, Td) vaccine. You may need a Td booster every 10 years.  Zoster vaccine. You may need this after age 59.  Pneumococcal 13-valent conjugate (PCV13) vaccine. One dose is recommended after age 30.  Pneumococcal polysaccharide (PPSV23) vaccine. One dose is recommended after age 27. Talk to your health care provider about which screenings and vaccines you need and how often you need them. This information is not intended to replace advice given to you by your health care provider. Make sure you discuss any questions you have with your health care provider. Document Released: 02/07/2015 Document Revised: 10/01/2015 Document Reviewed: 11/12/2014 Elsevier Interactive Patient Education  2017 [place] Prevention in the Home Falls can cause injuries. They can happen to people of all ages. There are many things you can do to make your home safe and to help prevent falls. What can I do on the outside of my home?  Regularly fix the edges of walkways and driveways and fix any cracks.  Remove anything that might make you trip as you walk through a door,  such as a raised step or threshold.  Trim any bushes or trees on the path to your home.  Use bright outdoor lighting.  Clear any walking paths of anything that might make someone trip, such as rocks or tools.  Regularly check to see if handrails are loose or broken. Make sure that both sides of any steps have handrails.  Any raised decks and porches should have guardrails on the edges.  Have any leaves, snow, or ice cleared regularly.  Use sand or salt on walking paths during winter.  Clean up any spills in your garage right away. This includes oil or grease spills. What can I do in the bathroom?  Use night lights.  Install grab bars by the toilet and in the tub and shower. Do not use towel bars as grab bars.  Use non-skid mats or decals in the tub or shower.  If you need to sit down in the shower, use a plastic, non-slip stool.  Keep the floor dry. Clean up any water that spills on the floor as soon as it happens.  Remove soap buildup in the tub or shower regularly.  Attach bath mats securely with double-sided non-slip rug tape.  Do not have throw rugs and other things on the floor that can make you trip. What can I do in the bedroom?  Use night lights.  Make sure that you have a light by your bed that is easy to reach.  Do not use any sheets or blankets that are too big for your bed. They should not hang down onto the floor.  Have a firm chair that has side arms. You can use this for support while you get dressed.  Do not have throw rugs and other things on the floor that can make you trip. What can I do in the kitchen?  Clean up any spills right away.  Avoid walking on wet floors.  Keep items that you use a lot in easy-to-reach places.  If you need to reach something above you, use a strong step stool that has a grab bar.  Keep electrical cords out of the way.  Do not use floor polish or wax that makes floors slippery. If you must use wax, use non-skid  floor wax.  Do not have throw rugs and other things on the floor that can make you trip. What can I do with my stairs?  Do not leave any items on the stairs.  Make sure that there are handrails on both sides of the stairs and use them. Fix handrails that are broken or loose. Make sure that handrails are as long as the stairways.  Check any carpeting to make sure that it is firmly attached to the stairs. Fix any carpet that is loose or worn.  Avoid having throw rugs at the top or bottom of the stairs. If you do have throw rugs, attach them to the floor with carpet tape.  Make sure that you have a light switch at the top of the stairs and the bottom of the stairs. If you do not have them, ask someone to add them for you. What else can I do to help prevent falls?  Wear shoes that:  Do not have high heels.  Have rubber bottoms.  Are comfortable and fit you well.  Are closed at the toe. Do not wear sandals.  If you use a stepladder:  Make sure that it is fully opened. Do not climb a closed stepladder.  Make sure that both sides of the stepladder are locked into place.  Ask someone to hold it for you, if possible.  Clearly mark and make sure that you can see:  Any grab bars or handrails.  First and last steps.  Where the edge of each step is.  Use tools that help you move around (mobility aids) if they are needed. These include:  Canes.  Walkers.  Scooters.  Crutches.  Turn on the lights when you go into a dark area. Replace any light bulbs as soon as they burn out.  Set up your furniture so you have a clear path. Avoid moving your furniture around.  If any of your floors are uneven, fix them.  If there are any pets around you, be aware of where they are.  Review your medicines with your doctor. Some medicines can make you feel dizzy. This can increase your chance of falling. Ask your doctor what other things that you can do to help prevent falls. This  information is not intended to replace advice given to you by your health care provider. Make sure you discuss any questions you have with your health care provider. Document Released: 11/07/2008 Document Revised: 06/19/2015 Document Reviewed: 02/15/2014 Elsevier Interactive Patient Education  2017 [employer].

## 2020-01-11 NOTE — Progress Notes (Signed)
Subjective:   Kristina Ayala is a 74 y.o. female who presents for Medicare Annual (Subsequent) preventive examination.  Review of Systems    No ROS. Medicare Wellness Visit. Additional risk factors are reflected in social history. Cardiac Risk Factors include: advanced age (>108men, >4 women);diabetes mellitus;dyslipidemia;hypertension     Objective:    Today's Vitals   01/11/20 0905  BP: 130/80  Pulse: 93  Temp: 97.7 F (36.5 C)  SpO2: 98%  Weight: 163 lb 12.8 oz (74.3 kg)  Height: 5\' 4"  (1.626 m)  PainSc: 0-No pain   Body mass index is 28.12 kg/m.  Advanced Directives 01/11/2020 12/14/2019 01/10/2019 06/01/2018 07/14/2017 04/18/2017 03/24/2017  Does Patient Have a Medical Advance Directive? No No No No No No No  Does patient want to make changes to medical advance directive? - - - - - - -  Would patient like information on creating a medical advance directive? No - Patient declined No - Patient declined Yes (ED - Information included in AVS) No - Patient declined Yes (ED - Information included in AVS) No - Patient declined -    Current Medications (verified) Outpatient Encounter Medications as of 01/11/2020  Medication Sig  . ALPRAZolam (XANAX) 0.5 MG tablet TAKE 1 TABLET(0.5 MG) BY MOUTH AT BEDTIME AS NEEDED FOR ANXIETY  . amLODipine (NORVASC) 5 MG tablet Take 1.5 tablets (7.5 mg total) by mouth daily.  [name] aspirin EC 81 MG tablet Take 81 mg by mouth daily.  [name] atorvastatin (LIPITOR) 40 MG tablet Take 1 tablet (40 mg total) by mouth every other day.  . Cholecalciferol (VITAMIN D3) 10000 units TABS Take 1 tablet by mouth once a week.   . Cyanocobalamin (VITAMIN B-12) 2500 MCG SUBL Place 1 tablet under the tongue daily.  [name] denosumab (PROLIA) 60 MG/ML SOSY injection INJECT 60 MG INTO THE SKIN ONCE FOR 1 DOSE  . diphenoxylate-atropine (LOMOTIL) 2.5-0.025 MG tablet Take 1 tablet by mouth 4 (four) times daily as needed for diarrhea or loose stools.  . ferrous sulfate 325 (65  FE) MG tablet Take 1 tablet (325 mg total) by mouth daily with breakfast.  . gabapentin (NEURONTIN) 400 MG capsule Take 1 capsule (400 mg total) by mouth at bedtime.  . hydrochlorothiazide (HYDRODIURIL) 25 MG tablet TAKE 1 TABLET(25 MG) BY MOUTH EVERY MORNING  . lisinopril (ZESTRIL) 40 MG tablet TAKE 1 TABLET(40 MG) BY MOUTH DAILY  . Magnesium 100 MG CAPS Take 100 mg by mouth daily.  . meloxicam (MOBIC) 7.5 MG tablet Take 1 tablet (7.5 mg total) by mouth daily.  . potassium chloride (KLOR-CON) 8 MEQ tablet TAKE 2 TABLETS(16 MEQ) BY MOUTH DAILY  . tiZANidine (ZANAFLEX) 4 MG tablet TAKE 1 TABLET(4 MG) BY MOUTH DAILY AS NEEDED FOR MUSCLE SPASMS  . traMADol (ULTRAM) 50 MG tablet Take 1 tablet (50 mg total) by mouth every 6 (six) hours as needed.  . TURMERIC PO Take 1 tablet by mouth daily.  . Vitamin D, Ergocalciferol, (DRISDOL) 1.25 MG (50000 UNIT) CAPS capsule Take 1 capsule (50,000 Units total) by mouth every 7 (seven) days.   No facility-administered encounter medications on file as of 01/11/2020.    Allergies (verified) Doxycycline, Minocycline, Wellbutrin [bupropion], and Penicillins   History: Past Medical History:  Diagnosis Date  . Arthritis   . Bursitis    left elbow  . Clostridium difficile infection   . Frequent falls   . HLD (hyperlipidemia)   . Hypertension   . Infectious colitis    Past  Surgical History:  Procedure Laterality Date  . CATARACT EXTRACTION Bilateral   . COLONOSCOPY    . ORIF PROXIMAL TIBIAL PLATEAU FRACTURE Left 02/19/2012   knee  . POLYPECTOMY    . TONSILLECTOMY     Family History  Problem Relation Age of Onset  . Diverticulosis Mother   . Ovarian cancer Mother   . Pancreatic cancer Father   . Lung cancer Father   . Colon polyps Sister   . Colon cancer Neg Hx    Social History   Socioeconomic History  . Marital status: Divorced    Spouse name: Not on file  . Number of children: 2  . Years of education: Not on file  . Highest education  level: Not on file  Occupational History  . Occupation: RETIRED  Tobacco Use  . Smoking status: Former Smoker    Packs/day: 0.25    Years: 50.00    Pack years: 12.50    Types: Cigarettes    Quit date: 08/28/2014    Years since quitting: 5.3  . Smokeless tobacco: Never Used  . Tobacco comment: 1 pack every 3 days  Vaping Use  . Vaping Use: Never used  Substance and Sexual Activity  . Alcohol use: No    Alcohol/week: 0.0 standard drinks  . Drug use: No  . Sexual activity: Not Currently  Other Topics Concern  . Not on file  Social History Narrative   Lives alone has 2 supportive children.      Social Determinants of Health   Financial Resource Strain: Low Risk   . Difficulty of Paying Living Expenses: Not hard at all  Food Insecurity: No Food Insecurity  . Worried About Charity fundraiser in the Last Year: Never true  . Ran Out of Food in the Last Year: Never true  Transportation Needs: No Transportation Needs  . Lack of Transportation (Medical): No  . Lack of Transportation (Non-Medical): No  Physical Activity: Sufficiently Active  . Days of Exercise per Week: 5 days  . Minutes of Exercise per Session: 30 min  Stress: No Stress Concern Present  . Feeling of Stress : Not at all  Social Connections: Moderately Integrated  . Frequency of Communication with Friends and Family: More than three times a week  . Frequency of Social Gatherings with Friends and Family: More than three times a week  . Attends Religious Services: More than 4 times per year  . Active Member of Clubs or Organizations: Yes  . Attends Archivist Meetings: More than 4 times per year  . Marital Status: Widowed    Tobacco Counseling Counseling given: Not Answered Comment: 1 pack every 3 days   Clinical Intake:  Pre-visit preparation completed: Yes  Pain : No/denies pain Pain Score: 0-No pain     BMI - recorded: 28.12 Nutritional Status: BMI 25 -29 Overweight Nutritional Risks:  None Diabetes: No  How often do you need to have someone help you when you read instructions, pamphlets, or other written materials from your doctor or pharmacy?: 1 - Never What is the last grade level you completed in school?: 14 years  Diabetic? yes  Interpreter Needed?: No  Information entered by :: Lisette Abu, LPN   Activities of Daily Living In your present state of health, do you have any difficulty performing the following activities: 01/11/2020  Hearing? N  Vision? N  Difficulty concentrating or making decisions? N  Walking or climbing stairs? N  Dressing or bathing? N  Doing  errands, shopping? N  Preparing Food and eating ? N  Using the Toilet? N  In the past six months, have you accidently leaked urine? N  Do you have problems with loss of bowel control? N  Managing your Medications? N  Managing your Finances? N  Housekeeping or managing your Housekeeping? N  Some recent data might be hidden    Patient Care Team: [name], MD as PCP - General (Internal Medicine) Buford Dresser, MD as PCP - Cardiology (Cardiology) Lyndal Pulley, DO as Attending Physician (Family Medicine)  Indicate any recent Medical Services you may have received from other than [facility] providers in the past year (date may be approximate).     Assessment:   This is a routine wellness examination for Kween.  Hearing/Vision screen No exam data present  Dietary issues and exercise activities discussed: Current Exercise Habits: Home exercise routine, Type of exercise: walking;Other - see comments (water aerobics), Time (Minutes): 30, Frequency (Times/Week): 5, Weekly Exercise (Minutes/Week): 150, Intensity: Moderate, Exercise limited by: cardiac condition(s);respiratory conditions(s);orthopedic condition(s)  Goals    .  <enter goal here> (pt-stated)      Maintain current activity level and eventually get involved with a romantic relationship.       Depression  Screen PHQ 2/9 Scores 01/11/2020 01/10/2019 07/07/2018 07/14/2017 05/13/2017 04/05/2017 03/25/2017  PHQ - 2 Score 0 0 0 0 0 0 0  PHQ- 9 Score - - - 3 - - -    Fall Risk Fall Risk  01/11/2020 01/10/2019 07/07/2018 07/14/2017 05/13/2017  Falls in the past year? 0 1 1 No No  Comment - - - - -  Number falls in past yr: 0 0 0 - -  Injury with Fall? 0 1 1 - -  Risk Factor Category  - - - - -  Risk for fall due to : No Fall Risks - - - -  Follow up Falls evaluation completed;Education provided - - - -  Comment - - - - -    FALL RISK PREVENTION PERTAINING TO THE HOME:  Any stairs in or around the home? Yes  If so, are there any without handrails? No  Home free of loose throw rugs in walkways, pet beds, electrical cords, etc? Yes  Adequate lighting in your home to reduce risk of falls? Yes   ASSISTIVE DEVICES UTILIZED TO PREVENT FALLS:  Life alert? Yes  Use of a cane, walker or w/c? No  Grab bars in the bathroom? Yes  Shower chair or bench in shower? Yes  Elevated toilet seat or a handicapped toilet? Yes   TIMED UP AND GO:  Was the test performed? No .  Length of time to ambulate 10 feet: 0 sec.   Gait steady and fast without use of assistive device  Cognitive Function: Normal cognitive status assessed by direct observation by this Nurse Health Advisor. No abnormalities found.      6CIT Screen 01/10/2019  What Year? 0 points  What month? 0 points  What time? 0 points  Count back from 20 0 points  Months in reverse 0 points  Repeat phrase 0 points  Total Score 0    Immunizations Immunization History  Administered Date(s) Administered  . Fluad Quad(high Dose 65+) 10/18/2018  . Influenza, High Dose Seasonal PF 11/10/2016, 10/28/2017, 12/28/2019  . Influenza,inj,Quad PF,6+ Mos 11/12/2013, 11/27/2014  . Influenza-Unspecified 11/26/2015  . PFIZER SARS-COV-2 Vaccination 03/19/2019, 04/09/2019  . Pneumococcal Conjugate-13 04/30/2015  . Pneumococcal Polysaccharide-23 01/04/2014  .  Tdap  07/29/2014    TDAP status: Up to date  Flu Vaccine status: Up to date  Pneumococcal vaccine status: Up to date  Covid-19 vaccine status: Completed vaccines  Qualifies for Shingles Vaccine? Yes   Zostavax completed No   Shingrix Completed?: Yes  Screening Tests Health Maintenance  Topic Date Due  . FOOT EXAM  Never done  . OPHTHALMOLOGY EXAM  Never done  . COVID-19 Vaccine (3 - Pfizer risk 4-dose series) 05/07/2019  . HEMOGLOBIN A1C  01/10/2020  . DEXA SCAN  07/06/2020  . COLONOSCOPY  06/08/2022  . TETANUS/TDAP  07/28/2024  . INFLUENZA VACCINE  Completed  . Hepatitis C Screening  Completed  . PNA vac Low Risk Adult  Completed    Health Maintenance  Health Maintenance Due  Topic Date Due  . FOOT EXAM  Never done  . OPHTHALMOLOGY EXAM  Never done  . COVID-19 Vaccine (3 - Pfizer risk 4-dose series) 05/07/2019  . HEMOGLOBIN A1C  01/10/2020    Colorectal cancer screening: Type of screening: Colonoscopy. Completed 06/07/2012. Repeat every 10 years  Mammogram status: Completed 08/23/2019. Repeat every year  Bone Density status: Completed 07/07/2018. Results reflect: Bone density results: OSTEOPOROSIS. Repeat every 2 years.  Lung Cancer Screening: (Low Dose CT Chest recommended if Age 71-80 years, 30 pack-year currently smoking OR have quit w/in 15years.) does qualify.   Lung Cancer Screening Referral: no  Additional Screening:  Hepatitis C Screening: does qualify; Completed yes  Vision Screening: Recommended annual ophthalmology exams for early detection of glaucoma and other disorders of the eye. Is the patient up to date with their annual eye exam?  Yes  Who is the provider or what is the name of the office in which the patient attends annual eye exams? [facility]  If pt is not established with a provider, would they like to be referred to a provider to establish care? No .   Dental Screening: Recommended annual dental exams for proper oral  hygiene  Community Resource Referral / Chronic Care Management: CRR required this visit?  No   CCM required this visit?  No      Plan:     I have personally reviewed and noted the following in the patient's chart:   . Medical and social history . Use of alcohol, tobacco or illicit drugs  . Current medications and supplements . Functional ability and status . Nutritional status . Physical activity . Advanced directives . List of other physicians . Hospitalizations, surgeries, and ER visits in previous 12 months . Vitals . Screenings to include cognitive, depression, and falls . Referrals and appointments  In addition, I have reviewed and discussed with patient certain preventive protocols, quality metrics, and best practice recommendations. A written personalized care plan for preventive services as well as general preventive health recommendations were provided to patient.     Sheral Flow, LPN   11/57/2620   Nurse Notes: n/a

## 2020-01-29 NOTE — Progress Notes (Deleted)
Subjective:    Patient ID: Kristina Ayala, female    DOB: 1945/04/22, 75 y.o.   MRN: [id number]  HPI The patient is here for an acute visit.  Breast pain -    Medications and allergies reviewed with patient and updated if appropriate.  Patient Active Problem List   Diagnosis Date Noted  . Cough 12/04/2019  . Pancreatic mass 11/08/2019  . DOE (dyspnea on exertion) 03/20/2019  . Chronic left SI joint pain 03/13/2019  . Leg cramping 01/10/2019  . Osteoarthritis 01/10/2019  . Right lower quadrant abdominal pain 11/09/2018  . Adnexal mass, right 11/09/2018  . Change in stool 10/18/2018  . Lung nodules 06/19/2018  . Abnormal findings on diagnostic imaging of lung 06/08/2018  . Thyromegaly 10/28/2017  . Restless leg syndrome 10/12/2017  . Greater trochanteric bursitis of left hip 07/26/2017  . Hypokalemia 04/18/2017  . Degenerative arthritis of left knee 12/01/2016  . Anxiety 12/01/2016  . Diabetes mellitus without complication (HCC) 09/16/2016  . Bursitis of right shoulder 09/04/2015  . Lateral meniscus derangement 06/03/2015  . Tear of LCL (lateral collateral ligament) of knee 06/03/2015  . Lumbar radiculopathy 09/19/2014  . Low back pain 01/04/2014  . Bilateral knee pain 01/04/2014  . Diverticulosis of colon without hemorrhage 07/26/2013  . Trigger thumb of left hand 03/06/2013  . Essential hypertension, benign 02/15/2013  . Hyperlipidemia 09/28/2012  . COPD (chronic obstructive pulmonary disease) (HCC) 05/22/2012  . Osteoporosis 05/22/2012    Current Outpatient Medications on File Prior to Visit  Medication Sig Dispense Refill  . ALPRAZolam (XANAX) 0.5 MG tablet TAKE 1 TABLET(0.5 MG) BY MOUTH AT BEDTIME AS NEEDED FOR ANXIETY 30 tablet 3  . amLODipine (NORVASC) 5 MG tablet Take 1.5 tablets (7.5 mg total) by mouth daily. 135 tablet 1  . aspirin EC 81 MG tablet Take 81 mg by mouth daily.    [name] atorvastatin (LIPITOR) 40 MG tablet Take 1 tablet (40 mg total) by  mouth every other day. 45 tablet 1  . Cholecalciferol (VITAMIN D3) 10000 units TABS Take 1 tablet by mouth once a week.     . Cyanocobalamin (VITAMIN B-12) 2500 MCG SUBL Place 1 tablet under the tongue daily.    [name] denosumab (PROLIA) 60 MG/ML SOSY injection INJECT 60 MG INTO THE SKIN ONCE FOR 1 DOSE 1 mL 0  . diphenoxylate-atropine (LOMOTIL) 2.5-0.025 MG tablet Take 1 tablet by mouth 4 (four) times daily as needed for diarrhea or loose stools. 15 tablet 0  . ferrous sulfate 325 (65 FE) MG tablet Take 1 tablet (325 mg total) by mouth daily with breakfast. 30 tablet 3  . gabapentin (NEURONTIN) 400 MG capsule Take 1 capsule (400 mg total) by mouth at bedtime. 30 capsule 3  . hydrochlorothiazide (HYDRODIURIL) 25 MG tablet TAKE 1 TABLET(25 MG) BY MOUTH EVERY MORNING 90 tablet 3  . lisinopril (ZESTRIL) 40 MG tablet TAKE 1 TABLET(40 MG) BY MOUTH DAILY 90 tablet 1  . Magnesium 100 MG CAPS Take 100 mg by mouth daily.    . meloxicam (MOBIC) 7.5 MG tablet Take 1 tablet (7.5 mg total) by mouth daily. 30 tablet 5  . potassium chloride (KLOR-CON) 8 MEQ tablet TAKE 2 TABLETS(16 MEQ) BY MOUTH DAILY 180 tablet 1  . tiZANidine (ZANAFLEX) 4 MG tablet TAKE 1 TABLET(4 MG) BY MOUTH DAILY AS NEEDED FOR MUSCLE SPASMS 30 tablet 0  . traMADol (ULTRAM) 50 MG tablet Take 1 tablet (50 mg total) by mouth every 6 (six) hours as needed.  30 tablet 0  . TURMERIC PO Take 1 tablet by mouth daily.    . Vitamin D, Ergocalciferol, (DRISDOL) 1.25 MG (50000 UNIT) CAPS capsule Take 1 capsule (50,000 Units total) by mouth every 7 (seven) days. 12 capsule 0   No current facility-administered medications on file prior to visit.    Past Medical History:  Diagnosis Date  . Arthritis   . Bursitis    left elbow  . Clostridium difficile infection   . Frequent falls   . HLD (hyperlipidemia)   . Hypertension   . Infectious colitis     Past Surgical History:  Procedure Laterality Date  . CATARACT EXTRACTION Bilateral   . COLONOSCOPY     . ORIF PROXIMAL TIBIAL PLATEAU FRACTURE Left 02/19/2012   knee  . POLYPECTOMY    . TONSILLECTOMY      Social History   Socioeconomic History  . Marital status: Divorced    Spouse name: Not on file  . Number of children: 2  . Years of education: Not on file  . Highest education level: Not on file  Occupational History  . Occupation: RETIRED  Tobacco Use  . Smoking status: Former Smoker    Packs/day: 0.25    Years: 50.00    Pack years: 12.50    Types: Cigarettes    Quit date: 08/28/2014    Years since quitting: 5.4  . Smokeless tobacco: Never Used  . Tobacco comment: 1 pack every 3 days  Vaping Use  . Vaping Use: Never used  Substance and Sexual Activity  . Alcohol use: No    Alcohol/week: 0.0 standard drinks  . Drug use: No  . Sexual activity: Not Currently  Other Topics Concern  . Not on file  Social History Narrative   Lives alone has 2 supportive children.      Social Determinants of Health   Financial Resource Strain: Low Risk   . Difficulty of Paying Living Expenses: Not hard at all  Food Insecurity: No Food Insecurity  . Worried About Programme researcher, broadcasting/film/video in the Last Year: Never true  . Ran Out of Food in the Last Year: Never true  Transportation Needs: No Transportation Needs  . Lack of Transportation (Medical): No  . Lack of Transportation (Non-Medical): No  Physical Activity: Sufficiently Active  . Days of Exercise per Week: 5 days  . Minutes of Exercise per Session: 30 min  Stress: No Stress Concern Present  . Feeling of Stress : Not at all  Social Connections: Moderately Integrated  . Frequency of Communication with Friends and Family: More than three times a week  . Frequency of Social Gatherings with Friends and Family: More than three times a week  . Attends Religious Services: More than 4 times per year  . Active Member of Clubs or Organizations: Yes  . Attends Banker Meetings: More than 4 times per year  . Marital Status: Widowed     Family History  Problem Relation Age of Onset  . Diverticulosis Mother   . Ovarian cancer Mother   . Pancreatic cancer Father   . Lung cancer Father   . Colon polyps Sister   . Colon cancer Neg Hx     Review of Systems     Objective:  There were no vitals filed for this visit. BP Readings from Last 3 Encounters:  01/11/20 130/80  11/27/19 136/76  11/13/19 122/82   Wt Readings from Last 3 Encounters:  01/11/20 163 lb 12.8 oz (74.3  kg)  11/27/19 168 lb 12.8 oz (76.6 kg)  11/13/19 167 lb (75.8 kg)   There is no height or weight on file to calculate BMI.   Physical Exam         Assessment & Plan:    See Problem List for Assessment and Plan of chronic medical problems.    This visit occurred during the SARS-CoV-2 public health emergency.  Safety protocols were in place, including screening questions prior to the visit, additional usage of staff PPE, and extensive cleaning of exam room while observing appropriate contact time as indicated for disinfecting solutions.

## 2020-01-30 ENCOUNTER — Ambulatory Visit: Payer: Medicare Other | Admitting: Internal Medicine

## 2020-01-30 DIAGNOSIS — Z0289 Encounter for other administrative examinations: Secondary | ICD-10-CM

## 2020-02-08 ENCOUNTER — Encounter: Payer: Self-pay | Admitting: Internal Medicine

## 2020-02-08 NOTE — Telephone Encounter (Signed)
error 

## 2020-02-10 NOTE — Progress Notes (Signed)
Virtual Visit via telephone Note  I connected with Kristina Ayala on 02/10/20 at  8:30 AM EST by telephone and verified that I am speaking with the correct person using two identifiers.   I discussed the limitations of evaluation and management by telemedicine and the availability of in person appointments. The patient expressed understanding and agreed to proceed.  Present for the visit:  Myself, Dr Billey Gosling, Oda Cogan.  The patient is currently at home and I am in the office.    No referring provider.    History of Present Illness: She is here for an acute visit for cold symptoms.   Her symptoms started about one week ago.  She is experiencing productive cough with brownish sputum.  She has some nasal congestion.  She had a little diarrhea but took an otc anti-diarrheal and it has stopped.  She denies SOB, wheeze, fever. Her symptoms are not improving.   She has tried taking mucinex, saline nasal spray, decongestant  She is unsure what she has.  She has been afraid to eat since having the diarrhea.     Review of Systems  Constitutional: Negative for chills and fever.  HENT: Positive for congestion. Negative for ear pain, sinus pain and sore throat.   Respiratory: Positive for cough and sputum production (brown). Negative for shortness of breath and wheezing.   Cardiovascular: Negative for chest pain.  Gastrointestinal: Positive for diarrhea.  Musculoskeletal: Negative for myalgias.  Neurological: Negative for dizziness and headaches.      Social History   Socioeconomic History  . Marital status: Divorced    Spouse name: Not on file  . Number of children: 2  . Years of education: Not on file  . Highest education level: Not on file  Occupational History  . Occupation: RETIRED  Tobacco Use  . Smoking status: Former Smoker    Packs/day: 0.25    Years: 50.00    Pack years: 12.50    Types: Cigarettes    Quit date: 08/28/2014    Years since quitting: 5.4   . Smokeless tobacco: Never Used  . Tobacco comment: 1 pack every 3 days  Vaping Use  . Vaping Use: Never used  Substance and Sexual Activity  . Alcohol use: No    Alcohol/week: 0.0 standard drinks  . Drug use: No  . Sexual activity: Not Currently  Other Topics Concern  . Not on file  Social History Narrative   Lives alone has 2 supportive children.      Social Determinants of Health   Financial Resource Strain: Low Risk   . Difficulty of Paying Living Expenses: Not hard at all  Food Insecurity: No Food Insecurity  . Worried About Charity fundraiser in the Last Year: Never true  . Ran Out of Food in the Last Year: Never true  Transportation Needs: No Transportation Needs  . Lack of Transportation (Medical): No  . Lack of Transportation (Non-Medical): No  Physical Activity: Sufficiently Active  . Days of Exercise per Week: 5 days  . Minutes of Exercise per Session: 30 min  Stress: No Stress Concern Present  . Feeling of Stress : Not at all  Social Connections: Moderately Integrated  . Frequency of Communication with Friends and Family: More than three times a week  . Frequency of Social Gatherings with Friends and Family: More than three times a week  . Attends Religious Services: More than 4 times per year  . Active Member of Clubs or Organizations:  Yes  . Attends Club or Organization Meetings: More than 4 times per year  . Marital Status: Widowed      Assessment and Plan:  See Problem List for Assessment and Plan of chronic medical problems.   Follow Up Instructions:    I discussed the assessment and treatment plan with the patient. The patient was provided an opportunity to ask questions and all were answered. The patient agreed with the plan and demonstrated an understanding of the instructions.   The patient was advised to call back or seek an in-person evaluation if the symptoms worsen or if the condition fails to improve as anticipated.  Time spent on  telephone call ;  12 minutes  [name], MD

## 2020-02-12 ENCOUNTER — Telehealth: Payer: Self-pay

## 2020-02-12 ENCOUNTER — Other Ambulatory Visit: Payer: Self-pay

## 2020-02-12 ENCOUNTER — Telehealth (INDEPENDENT_AMBULATORY_CARE_PROVIDER_SITE_OTHER): Payer: Medicare Other | Admitting: Internal Medicine

## 2020-02-12 ENCOUNTER — Encounter: Payer: Self-pay | Admitting: Internal Medicine

## 2020-02-12 DIAGNOSIS — J209 Acute bronchitis, unspecified: Secondary | ICD-10-CM

## 2020-02-12 MED ORDER — CEFDINIR 300 MG PO CAPS: 300.0000 mg | ORAL_CAPSULE | Freq: Two times a day (BID) | ORAL | 0 refills | Status: DC

## 2020-02-12 NOTE — Assessment & Plan Note (Signed)
Acute Concern for bacterial cause No COPD exacerbation Start omnicef 300 mg BID x 10 days Continue otc cold medications Advised a probiotic since she frequently gets diarrhea from Abx Increase fluids, advised her to try eating Call if no improvement

## 2020-02-12 NOTE — Telephone Encounter (Signed)
Dr. Tamala Julian precharting for tomorrow and noticed patient was seen for sick visit today by PCP. Would like for patient to reschedule visit for tomorrow. Have spot on hold 02/27/20 at 9:15am.

## 2020-02-13 ENCOUNTER — Ambulatory Visit: Payer: Medicare Other | Admitting: Family Medicine
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
## 2020-02-28 ENCOUNTER — Encounter: Payer: Self-pay | Admitting: Family Medicine

## 2020-02-28 ENCOUNTER — Ambulatory Visit: Payer: Medicare Other | Admitting: Family Medicine

## 2020-02-28 ENCOUNTER — Other Ambulatory Visit: Payer: Self-pay

## 2020-02-28 ENCOUNTER — Ambulatory Visit (INDEPENDENT_AMBULATORY_CARE_PROVIDER_SITE_OTHER): Payer: Medicare Other

## 2020-02-28 VITALS — BP 132/82 | HR 89 | Ht 64.0 in | Wt 164.0 lb

## 2020-02-28 DIAGNOSIS — M1732 Unilateral post-traumatic osteoarthritis, left knee: Secondary | ICD-10-CM | POA: Diagnosis not present

## 2020-02-28 DIAGNOSIS — M545 Low back pain, unspecified: Secondary | ICD-10-CM | POA: Diagnosis not present

## 2020-02-28 DIAGNOSIS — G8929 Other chronic pain: Secondary | ICD-10-CM | POA: Diagnosis not present

## 2020-02-28 DIAGNOSIS — M25562 Pain in left knee: Secondary | ICD-10-CM | POA: Diagnosis not present

## 2020-02-28 MED ORDER — TIZANIDINE HCL 2 MG PO TABS: 2.0000 mg | ORAL_TABLET | Freq: Every day | ORAL | 0 refills | Status: DC

## 2020-02-28 MED ORDER — GABAPENTIN 100 MG PO CAPS: 200.0000 mg | ORAL_CAPSULE | Freq: Every day | ORAL | 0 refills | Status: DC

## 2020-02-28 MED ORDER — AMBULATORY NON FORMULARY MEDICATION: 1.0000 [IU] | Freq: Once | 0 refills | Status: AC

## 2020-02-28 NOTE — Patient Instructions (Addendum)
Gabapentin refilled for you Zanaflex 2mg  at night Xray back and left knee See me again in one month

## 2020-02-28 NOTE — Assessment & Plan Note (Signed)
Patient has moderate arthritic changes of the knee.  Attempted an MRI previously but secondary to artifact from patient's tibial plateau repair unable to see if the meniscus are torn.  We discussed potential injections again but will hold with her last one being only 3 months ago.  Patient though is getting Zanaflex at a low dose that I think will be beneficial.  Patient does have the gabapentin and we can continue the same dosing at the moment.  Patient will follow up with me again in 4 weeks
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
## 2020-02-29 ENCOUNTER — Other Ambulatory Visit: Payer: Self-pay

## 2020-02-29 ENCOUNTER — Telehealth: Payer: Self-pay | Admitting: Internal Medicine

## 2020-02-29 NOTE — Telephone Encounter (Signed)
Appointment made for Monday

## 2020-02-29 NOTE — Telephone Encounter (Signed)
Patient wondering if she can go to the lab and get blood work done to check her blood sugar, she said last time she was pre-diabetic and she wants to make sure it hasn't got worse

## 2020-02-29 NOTE — Telephone Encounter (Signed)
She is due for a f/u appt and we can do blood work then.  Her last f/u appt was in June 21.  She has had a couple of acute visits since then, but we need to do a follow up visit.

## 2020-03-02 NOTE — Patient Instructions (Addendum)
    Blood work was ordered.      Medications changes include :   none     Please followup in 6 months  

## 2020-03-02 NOTE — Progress Notes (Unsigned)
Subjective:    Patient ID: Kristina Ayala, female    DOB: 1945/08/04, 75 y.o.   MRN: [id number]  HPI The patient is here for follow up of their chronic medical problems, including htn, DM, hyperlipidemia,  anxiety  Her bowels are loose and thin.  It started two days ago after eating at a [place]. No abdominal cramping.  They were normal prior to that.  She denies any blood in the stool.   She does not eat that much.  She has lost a little weight and felt she needed to increase her weight and has been eating more pasta, etc to gain weight.     Medications and allergies reviewed with patient and updated if appropriate.  Patient Active Problem List   Diagnosis Date Noted  . Cough 12/04/2019  . Pancreatic mass 11/08/2019  . DOE (dyspnea on exertion) 03/20/2019  . Chronic left SI joint pain 03/13/2019  . Leg cramping 01/10/2019  . Osteoarthritis 01/10/2019  . Right lower quadrant abdominal pain 11/09/2018  . Adnexal mass, right 11/09/2018  . Change in stool 10/18/2018  . Lung nodules 06/19/2018  . Abnormal findings on diagnostic imaging of lung 06/08/2018  . Thyromegaly 10/28/2017  . Restless leg syndrome 10/12/2017  . Greater trochanteric bursitis of left hip 07/26/2017  . Hypokalemia 04/18/2017  . Acute bronchitis 12/28/2016  . Degenerative arthritis of left knee 12/01/2016  . Anxiety 12/01/2016  . Diabetes mellitus without complication ([place]) 22/02/5425  . Bursitis of right shoulder 09/04/2015  . Lateral meniscus derangement 06/03/2015  . Tear of LCL (lateral collateral ligament) of knee 06/03/2015  . Lumbar radiculopathy 09/19/2014  . Low back pain 01/04/2014  . Bilateral knee pain 01/04/2014  . Diverticulosis of colon without hemorrhage 07/26/2013  . Trigger thumb of left hand 03/06/2013  . Essential hypertension, benign 02/15/2013  . Hyperlipidemia 09/28/2012  . COPD (chronic obstructive pulmonary disease) ([place]) 05/22/2012  . Osteoporosis 05/22/2012     Current Outpatient Medications on File Prior to Visit  Medication Sig Dispense Refill  . ALPRAZolam (XANAX) 0.5 MG tablet TAKE 1 TABLET(0.5 MG) BY MOUTH AT BEDTIME AS NEEDED FOR ANXIETY 30 tablet 3  . amLODipine (NORVASC) 5 MG tablet Take 1.5 tablets (7.5 mg total) by mouth daily. 135 tablet 1  . aspirin EC 81 MG tablet Take 81 mg by mouth daily.    [name] atorvastatin (LIPITOR) 40 MG tablet Take 1 tablet (40 mg total) by mouth every other day. 45 tablet 1  . Cholecalciferol (VITAMIN D3) 10000 units TABS Take 1 tablet by mouth once a week.     . Cyanocobalamin (VITAMIN B-12) 2500 MCG SUBL Place 1 tablet under the tongue daily.    [name] denosumab (PROLIA) 60 MG/ML SOSY injection INJECT 60 MG INTO THE SKIN ONCE FOR 1 DOSE 1 mL 0  . diphenoxylate-atropine (LOMOTIL) 2.5-0.025 MG tablet Take 1 tablet by mouth 4 (four) times daily as needed for diarrhea or loose stools. 15 tablet 0  . ferrous sulfate 325 (65 FE) MG tablet Take 1 tablet (325 mg total) by mouth daily with breakfast. 30 tablet 3  . gabapentin (NEURONTIN) 100 MG capsule Take 2 capsules (200 mg total) by mouth at bedtime. 180 capsule 0  . hydrochlorothiazide (HYDRODIURIL) 25 MG tablet TAKE 1 TABLET(25 MG) BY MOUTH EVERY MORNING 90 tablet 3  . lisinopril (ZESTRIL) 40 MG tablet TAKE 1 TABLET(40 MG) BY MOUTH DAILY 90 tablet 1  . Magnesium 100 MG CAPS Take 100 mg by mouth daily.    [name]  meloxicam (MOBIC) 7.5 MG tablet Take 1 tablet (7.5 mg total) by mouth daily. 30 tablet 5  . potassium chloride (KLOR-CON) 8 MEQ tablet TAKE 2 TABLETS(16 MEQ) BY MOUTH DAILY 180 tablet 1  . tiZANidine (ZANAFLEX) 2 MG tablet Take 1 tablet (2 mg total) by mouth at bedtime. 30 tablet 0  . TURMERIC PO Take 1 tablet by mouth daily.    [name] gabapentin (NEURONTIN) 400 MG capsule Take 1 capsule (400 mg total) by mouth at bedtime. (Patient not taking: Reported on 03/03/2020) 30 capsule 3   No current facility-administered medications on file prior to visit.    Past Medical  History:  Diagnosis Date  . Arthritis   . Bursitis    left elbow  . Clostridium difficile infection   . Frequent falls   . HLD (hyperlipidemia)   . Hypertension   . Infectious colitis     Past Surgical History:  Procedure Laterality Date  . CATARACT EXTRACTION Bilateral   . COLONOSCOPY    . ORIF PROXIMAL TIBIAL PLATEAU FRACTURE Left 02/19/2012   knee  . POLYPECTOMY    . TONSILLECTOMY      Social History   Socioeconomic History  . Marital status: Divorced    Spouse name: Not on file  . Number of children: 2  . Years of education: Not on file  . Highest education level: Not on file  Occupational History  . Occupation: RETIRED  Tobacco Use  . Smoking status: Former Smoker    Packs/day: 0.25    Years: 50.00    Pack years: 12.50    Types: Cigarettes    Quit date: 08/28/2014    Years since quitting: 5.5  . Smokeless tobacco: Never Used  . Tobacco comment: 1 pack every 3 days  Vaping Use  . Vaping Use: Never used  Substance and Sexual Activity  . Alcohol use: No    Alcohol/week: 0.0 standard drinks  . Drug use: No  . Sexual activity: Not Currently  Other Topics Concern  . Not on file  Social History Narrative   Lives alone has 2 supportive children.      Social Determinants of Health   Financial Resource Strain: Low Risk   . Difficulty of Paying Living Expenses: Not hard at all  Food Insecurity: No Food Insecurity  . Worried About Charity fundraiser in the Last Year: Never true  . Ran Out of Food in the Last Year: Never true  Transportation Needs: No Transportation Needs  . Lack of Transportation (Medical): No  . Lack of Transportation (Non-Medical): No  Physical Activity: Sufficiently Active  . Days of Exercise per Week: 5 days  . Minutes of Exercise per Session: 30 min  Stress: No Stress Concern Present  . Feeling of Stress : Not at all  Social Connections: Moderately Integrated  . Frequency of Communication with Friends and Family: More than three  times a week  . Frequency of Social Gatherings with Friends and Family: More than three times a week  . Attends Religious Services: More than 4 times per year  . Active Member of Clubs or Organizations: Yes  . Attends Archivist Meetings: More than 4 times per year  . Marital Status: Widowed    Family History  Problem Relation Age of Onset  . Diverticulosis Mother   . Ovarian cancer Mother   . Pancreatic cancer Father   . Lung cancer Father   . Colon polyps Sister   . Colon cancer Neg Hx  Review of Systems  Constitutional: Negative for fever.  Respiratory: Negative for cough, shortness of breath and wheezing.   Cardiovascular: Negative for chest pain, palpitations and leg swelling.  Gastrointestinal: Negative for abdominal pain.  Neurological: Negative for light-headedness and headaches.       Objective:   Vitals:   03/03/20 1344  BP: 130/72  Pulse: 92  Temp: 98.1 F (36.7 C)  SpO2: 98%   BP Readings from Last 3 Encounters:  03/03/20 130/72  02/28/20 132/82  01/11/20 130/80   Wt Readings from Last 3 Encounters:  03/03/20 166 lb 3.2 oz (75.4 kg)  02/28/20 164 lb (74.4 kg)  01/11/20 163 lb 12.8 oz (74.3 kg)   Body mass index is 28.53 kg/m.   Physical Exam    Constitutional: Appears well-developed and well-nourished. No distress.  HENT:  Head: Normocephalic and atraumatic.  Neck: Neck supple. No tracheal deviation present. No thyromegaly present.  No cervical lymphadenopathy Cardiovascular: Normal rate, regular rhythm and normal heart sounds.   2/6 systolic murmur heard. No carotid bruit .  No edema Pulmonary/Chest: Effort normal and breath sounds normal. No respiratory distress. No has no wheezes. No rales.  Skin: Skin is warm and dry. Not diaphoretic.  Psychiatric: Normal mood and affect. Behavior is normal.      Assessment & Plan:    See Problem List for Assessment and Plan of chronic medical problems.    This visit occurred during  the SARS-CoV-2 public health emergency.  Safety protocols were in place, including screening questions prior to the visit, additional usage of staff PPE, and extensive cleaning of exam room while observing appropriate contact time as indicated for disinfecting solutions.

## 2020-03-03 ENCOUNTER — Other Ambulatory Visit: Payer: Self-pay

## 2020-03-03 ENCOUNTER — Ambulatory Visit (INDEPENDENT_AMBULATORY_CARE_PROVIDER_SITE_OTHER): Payer: Medicare Other | Admitting: Internal Medicine

## 2020-03-03 ENCOUNTER — Encounter: Payer: Self-pay | Admitting: Internal Medicine

## 2020-03-03 VITALS — BP 130/72 | HR 92 | Temp 98.1°F | Ht 64.0 in | Wt 166.2 lb

## 2020-03-03 DIAGNOSIS — E876 Hypokalemia: Secondary | ICD-10-CM | POA: Diagnosis not present

## 2020-03-03 DIAGNOSIS — R195 Other fecal abnormalities: Secondary | ICD-10-CM

## 2020-03-03 DIAGNOSIS — E119 Type 2 diabetes mellitus without complications: Secondary | ICD-10-CM | POA: Diagnosis not present

## 2020-03-03 DIAGNOSIS — F419 Anxiety disorder, unspecified: Secondary | ICD-10-CM

## 2020-03-03 DIAGNOSIS — I10 Essential (primary) hypertension: Secondary | ICD-10-CM | POA: Diagnosis not present

## 2020-03-03 DIAGNOSIS — E782 Mixed hyperlipidemia: Secondary | ICD-10-CM

## 2020-03-03 LAB — LIPID PANEL
Cholesterol: 165 mg/dL (ref 0–200)
HDL: 48.9 mg/dL (ref >39.00)
LDL Cholesterol: 83 mg/dL (ref 0–99)
NonHDL: 116.4
Total CHOL/HDL Ratio: 3
Triglycerides: 169 mg/dL — ABNORMAL HIGH (ref 0.0–149.0)
VLDL: 33.8 mg/dL (ref 0.0–40.0)

## 2020-03-03 LAB — COMPREHENSIVE METABOLIC PANEL
ALT: 32 U/L (ref 0–35)
AST: 24 U/L (ref 0–37)
Albumin: 4.4 g/dL (ref 3.5–5.2)
Alkaline Phosphatase: 61 U/L (ref 39–117)
BUN: 17 mg/dL (ref 6–23)
CO2: 32 mEq/L (ref 19–32)
Calcium: 10.2 mg/dL (ref 8.4–10.5)
Chloride: 102 mEq/L (ref 96–112)
Creatinine, Ser: 0.84 mg/dL (ref 0.40–1.20)
GFR: 67.71 mL/min (ref >60.00)
Glucose, Bld: 75 mg/dL (ref 70–99)
Potassium: 3.7 mEq/L (ref 3.5–5.1)
Sodium: 141 mEq/L (ref 135–145)
Total Bilirubin: 0.4 mg/dL (ref 0.2–1.2)
Total Protein: 7.3 g/dL (ref 6.0–8.3)

## 2020-03-03 LAB — CBC WITH DIFFERENTIAL/PLATELET
Basophils Absolute: 0.1 10*3/uL (ref 0.0–0.1)
Basophils Relative: 1 % (ref 0.0–3.0)
Eosinophils Absolute: 0.2 10*3/uL (ref 0.0–0.7)
Eosinophils Relative: 2 % (ref 0.0–5.0)
HCT: 39.3 % (ref 36.0–46.0)
Hemoglobin: 12.9 g/dL (ref 12.0–15.0)
Lymphocytes Relative: 36.7 % (ref 12.0–46.0)
Lymphs Abs: 3.1 10*3/uL (ref 0.7–4.0)
MCHC: 32.8 g/dL (ref 30.0–36.0)
MCV: 80.9 fl (ref 78.0–100.0)
Monocytes Absolute: 0.8 10*3/uL (ref 0.1–1.0)
Monocytes Relative: 10 % (ref 3.0–12.0)
Neutro Abs: 4.2 10*3/uL (ref 1.4–7.7)
Neutrophils Relative %: 50.3 % (ref 43.0–77.0)
Platelets: 315 10*3/uL (ref 150.0–400.0)
RBC: 4.86 Mil/uL (ref 3.87–5.11)
RDW: 15.4 % (ref 11.5–15.5)
WBC: 8.3 10*3/uL (ref 4.0–10.5)

## 2020-03-03 LAB — HEMOGLOBIN A1C: Hgb A1c MFr Bld: 6.2 % (ref 4.6–6.5)

## 2020-03-03 LAB — HM DIABETES EYE EXAM

## 2020-03-03 MED ORDER — AMLODIPINE BESYLATE 5 MG PO TABS: 7.5000 mg | ORAL_TABLET | Freq: Every day | ORAL | 1 refills | Status: DC

## 2020-03-03 NOTE — Assessment & Plan Note (Signed)
Chronic Check lipid panel  Continue atorvastatin 40 mg Regular exercise and healthy diet encouraged  

## 2020-03-03 NOTE — Assessment & Plan Note (Signed)
Chronic Readdressed the fact that she is now diabetic-she was not clear on that Discussed the importance of exercise, keeping her weight down-would not recommend weight gain and eating a low sugar/carbohydrate diet Will refer for but diabetic education Check A1c

## 2020-03-03 NOTE — Assessment & Plan Note (Signed)
Chronic BP well controlled Continue amlodipine 7.5 mg daily, hctz 25 mg  cmp

## 2020-03-03 NOTE — Assessment & Plan Note (Signed)
Acute Started 2 days ago after eating [place] food Prior to that her stools were normal Advised just monitoring for now given no concerning symptoms

## 2020-03-03 NOTE — Assessment & Plan Note (Signed)
Chronic Taking anxiety intermittently Takes xanax 0.5 mg as needed only - has not used it recently [facility] to continue

## 2020-03-03 NOTE — Assessment & Plan Note (Addendum)
Chronic Continue potassium chloride 16 mEq daily CMP

## 2020-03-13 ENCOUNTER — Ambulatory Visit: Payer: Medicare Other | Admitting: Family Medicine

## 2020-03-13 ENCOUNTER — Other Ambulatory Visit: Payer: Self-pay

## 2020-03-13 ENCOUNTER — Ambulatory Visit: Payer: Self-pay

## 2020-03-13 ENCOUNTER — Encounter: Payer: Self-pay | Admitting: Internal Medicine

## 2020-03-13 ENCOUNTER — Encounter: Payer: Self-pay | Admitting: Family Medicine

## 2020-03-13 VITALS — BP 138/76 | HR 103 | Ht 64.0 in | Wt 166.2 lb

## 2020-03-13 DIAGNOSIS — M25562 Pain in left knee: Secondary | ICD-10-CM

## 2020-03-13 DIAGNOSIS — G8929 Other chronic pain: Secondary | ICD-10-CM

## 2020-03-13 NOTE — Patient Instructions (Addendum)
You had a L knee injection today. Call or go to the ER if you develop a large red swollen joint with extreme pain or oozing puss.   Recheck as needed.   See how the knee feels for the next few hours while the numbing medicine is working. The numbing medicine will stop in a few hours and then the cortisone will start working in 1-2 days.

## 2020-03-13 NOTE — Progress Notes (Signed)
Outside notes received. Information abstracted. Notes sent to scan.  

## 2020-03-13 NOTE — Progress Notes (Signed)
I, Kristina Ayala, LAT, ATC, am serving as scribe for Dr. [name].  Kristina Ayala is a 75 y.o. female who presents to [place] at [facility] today for f/u of L knee pain.  She was last seen by Dr. Tamala Julian on 02/28/20 for LBP and L knee pain and Dr. Tamala Julian declined to do an injection due to the timing of her previous L knee injection on 11/27/19.  She was prescribed Zanaflex and advised to con't her Gabapentin.  She has a hx of a L tibial plateau fracture.  Since her last visit w/ Dr. Tamala Julian, pt reports that her L knee pain has worsened and locates her pain to her L medial knee.  She notes radiating pain into her L lower leg.    Diagnostic imaging: L knee XR- 02/28/20; L knee MRI- 08/23/19  Pertinent review of systems: No fevers or chills  Relevant historical information: History left lateral tibial plateau fracture requiring surgery.   Exam:  BP 138/76 (BP Location: Right Arm, Patient Position: Sitting, Cuff Size: Normal)   Pulse (!) 103   Ht 5\' 4"  (1.626 m)   Wt 166 lb 3.2 oz (75.4 kg)   SpO2 97%   BMI 28.53 kg/m  General: Well Developed, well nourished, and in no acute distress.   MSK: Left knee mild effusion.  Normal motion with crepitation.  Tender palpation medial joint line.    Lab and Radiology Results  Procedure: Real-time Ultrasound Guided Injection of left knee superior lateral patellar space Device: Philips Affiniti 50G Images permanently stored and available for review in PACS Verbal informed consent obtained.  Discussed risks and benefits of procedure. Warned about infection bleeding damage to structures skin hypopigmentation and fat atrophy among others. Patient expresses understanding and agreement Time-out conducted.   Noted no overlying erythema, induration, or other signs of local infection.   Skin prepped in a sterile fashion.   Local anesthesia: Topical Ethyl chloride.   With sterile technique and under real time ultrasound guidance:  40  mg of Kenalog and 2 mL of Marcaine injected into knee joint. Fluid seen entering the joint capsule.   Completed without difficulty   Pain moderately resolved suggesting accurate placement of the medication.   Advised to call if fevers/chills, erythema, induration, drainage, or persistent bleeding.   Images permanently stored and available for review in the ultrasound unit.  Impression: Technically successful ultrasound guided injection.   EXAM: LEFT KNEE - 3 VIEW  COMPARISON:  06/07/2019  FINDINGS: Tibial plate and screws are intact without periprosthetic fracture or lucency. Diffuse osteopenia. Scattered arterial atherosclerotic calcifications.  IMPRESSION: No acute abnormality of the left knee. Tibial hardware is intact without periprosthetic fracture or lucency.   Electronically Signed   By: Miachel Roux M.D.   On: 02/28/2020 13:35 I, [name], personally (independently) visualized and performed the interpretation of the images attached in this note.       Assessment and Plan: 75 y.o. female with medial knee pain.  Patient does have some pain extending into the medial lower leg.  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  She may have some lumbar radicular component but this is less likely.  Patient has failed early trials of conservative management.  Plan for steroid injection.  She has had prior steroid injections in her knee which has helped.  If not better would consider further evaluation for possible lumbar radiculopathy.  Recheck back in the near future especially if not improved.  PDMP not reviewed this encounter. Orders Placed This Encounter  Procedures  . [place] LIMITED JOINT SPACE STRUCTURES LOW LEFT(NO LINKED CHARGES)    Order Specific Question:   Reason for Exam (SYMPTOM  OR DIAGNOSIS REQUIRED)    Answer:   L knee pain    Order Specific Question:   Preferred imaging location?    Answer:   [facility]   No orders of the defined  types were placed in this encounter.    Discussed warning signs or symptoms. Please see discharge instructions. Patient expresses understanding.   The above documentation has been reviewed and is accurate and complete [name], M.D.

## 2020-03-21 ENCOUNTER — Telehealth: Payer: Self-pay | Admitting: Family Medicine

## 2020-03-21 NOTE — Telephone Encounter (Signed)
Patient asked if an order for an epidural could be put in for her at [place] [facility]. Please advise.

## 2020-03-24 ENCOUNTER — Other Ambulatory Visit: Payer: Self-pay

## 2020-03-24 DIAGNOSIS — M5416 Radiculopathy, lumbar region: Secondary | ICD-10-CM

## 2020-03-24 NOTE — Telephone Encounter (Signed)
Attempted to call patient but there was no answer.

## 2020-03-24 NOTE — Telephone Encounter (Signed)
Lets do a L5 left nerve root injection  Then see me 4 weeks after injection

## 2020-03-25 NOTE — Telephone Encounter (Signed)
Spoke with patient regarding order placed for nerve root injection.

## 2020-03-31 ENCOUNTER — Ambulatory Visit: Payer: Medicare Other | Admitting: Family Medicine

## 2020-04-10 ENCOUNTER — Encounter: Payer: Self-pay | Admitting: Registered"

## 2020-04-10 ENCOUNTER — Other Ambulatory Visit: Payer: Self-pay

## 2020-04-10 ENCOUNTER — Encounter: Payer: Medicare Other | Attending: Internal Medicine | Admitting: Registered"

## 2020-04-10 DIAGNOSIS — E119 Type 2 diabetes mellitus without complications: Secondary | ICD-10-CM | POA: Insufficient documentation

## 2020-04-10 NOTE — Patient Instructions (Signed)
Goals:  Follow Diabetes Meal Plan as instructed  Eat 3 meals and 2 snacks, every 3-5 hrs. Try not to skip meals.   Aim to have 1/2 plate of non-starchy vegetables, 1/4 plate of lean protein, and 1/4 plate of carbohydrate for lunch and dinner  Snacks to include carbohydrates + protein  Add lean protein foods to meals/snacks  Monitor glucose levels as instructed by your doctor  Aim for 30 mins of physical activity daily

## 2020-04-10 NOTE — Progress Notes (Signed)
Diabetes Self-Management Education  Visit Type:  First/Initial  Appt. Start Time: 9:45 Appt. End Time: 11:09  04/10/2020  [name], identified by name and date of birth, is a 75 y.o. female with a diagnosis of Diabetes: Type 2.   ASSESSMENT  States daughter and granddaughter were living with her until 01/2020; lived with her for 19 months. States she was participating in water aerobics prior to them moving in. States while they were living with her she was busy keeping up with them.   States she loves Popeye's chicken sandwich and will have it sometimes for lunch but not daily. Reports she eats yams/sweet potatoes instead of white potatoes and does not eat bread.   States she is stressed due to daughter and helping care for granddaughter. 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 States she has good friends in [place] and [place] that she keeps in touch with. States she would like to join a group setting with others who have similarities with her to begin traveling again and meeting new people.   Pt expectations: wants to know what to not eat  There were no vitals taken for this visit. There is no height or weight on file to calculate BMI.    Diabetes Self-Management Education - 04/10/20 0954      Health Coping   How would you rate your overall health? Good      Psychosocial Assessment   Patient Belief/Attitude about Diabetes Motivated to manage diabetes    Self-care barriers None    Patient Concerns Nutrition/Meal planning    Special Needs None    Preferred Learning Style No preference indicated    Learning Readiness Ready      Complications   Last HgB A1C per patient/outside source 6.2 %    How often do you check your blood sugar? 0 times/day (not testing)    Number of hypoglycemic episodes per month 0    Number of hyperglycemic episodes per week 0    Have you had a dilated eye exam in the past 12 months? Yes    Have you had a dental  exam in the past 12 months? Yes    Are you checking your feet? No      Dietary Intake   Breakfast skipped; typically has grits + bacon/eggs or Ensure    Lunch skipped; typically leftovers or Popeye's-chicken sandwich    Dinner 5 pm - baked spaghetti (angel hair pasta, cheese, turkey/beef)) + salad (romaine, spinach, egg, cucumbers, zucchini, beets, onions, cranberries) with ranch dressing + water    Beverage(s) water      Exercise   Exercise Type ADL's   going up and down stairs in home   How many days per week to you exercise? 2    How many minutes per day do you exercise? 20    Total minutes per week of exercise 40      Patient Education   Previous Diabetes Education No    Disease state  Definition of diabetes, type 1 and 2, and the diagnosis of diabetes;Factors that contribute to the development of diabetes    Nutrition management  Role of diet in the treatment of diabetes and the relationship between the three main macronutrients and blood glucose level;Effects of alcohol on blood glucose and safety factors with consumption of alcohol.;Information on hints to eating out and maintain blood glucose control.;Food label reading, portion sizes and measuring food.    Monitoring Identified appropriate SMBG  and/or A1C goals.    Acute complications Taught treatment of hypoglycemia - the 15 rule.;Discussed and identified patients' treatment of hyperglycemia.    Chronic complications Relationship between chronic complications and blood glucose control;Lipid levels, blood glucose control and heart disease    Psychosocial adjustment Identified and addressed patients feelings and concerns about diabetes;Role of stress on diabetes;Worked with patient to identify barriers to care and solutions;Brainstormed with patient on coping mechanisms for social situations, getting support from significant others, dealing with feelings about diabetes    Personal strategies to promote health Lifestyle issues that need  to be addressed for better diabetes care      Individualized Goals (developed by patient)   Physical Activity Exercise 3-5 times per week;15 minutes per day    Medications take my medication as prescribed    Monitoring  Not Applicable    Reducing Risk treat hypoglycemia with 15 grams of carbs if blood glucose less than 70mg /dL;increase portions of nuts and seeds;increase portions of healthy fats      Post-Education Assessment   Patient understands the diabetes disease and treatment process. Demonstrates understanding / competency    Patient understands incorporating nutritional management into lifestyle. Demonstrates understanding / competency    Patient undertands incorporating physical activity into lifestyle. Needs Review    Patient understands using medications safely. Needs Review    Patient understands monitoring blood glucose, interpreting and using results Demonstrates understanding / competency    Patient understands prevention, detection, and treatment of acute complications. Demonstrates understanding / competency    Patient understands prevention, detection, and treatment of chronic complications. Needs Review    Patient understands how to develop strategies to address psychosocial issues. Demonstrates understanding / competency    Patient understands how to develop strategies to promote health/change behavior. Demonstrates understanding / competency      Outcomes   Program Status Not Completed           Learning Objective:  Patient will have a greater understanding of diabetes self-management. Patient education plan is to attend individual and/or group sessions per assessed needs and concerns.   Plan:   Patient Instructions  Goals:  Follow Diabetes Meal Plan as instructed  Eat 3 meals and 2 snacks, every 3-5 hrs. Try not to skip meals.   Aim to have 1/2 plate of non-starchy vegetables, 1/4 plate of lean protein, and 1/4 plate of carbohydrate for lunch and  dinner  Snacks to include carbohydrates + protein  Add lean protein foods to meals/snacks  Monitor glucose levels as instructed by your doctor  Aim for 30 mins of physical activity daily     Expected Outcomes:  Demonstrated interest in learning. Expect positive outcomes  Education material provided: ADA - How to Thrive: A Guide for Your Journey with Diabetes  If problems or questions, patient to contact team via:  Phone and Email  Future DSME appointment: - 4-6 wks

## 2020-04-18 ENCOUNTER — Ambulatory Visit
Admission: RE | Admit: 2020-04-18 | Discharge: 2020-04-18 | Disposition: A | Discharge status: Home / Self Care | Payer: Medicare Other | Source: Ambulatory Visit | Attending: Family Medicine | Admitting: Family Medicine

## 2020-04-18 ENCOUNTER — Other Ambulatory Visit: Payer: Self-pay

## 2020-04-18 DIAGNOSIS — M5416 Radiculopathy, lumbar region: Secondary | ICD-10-CM

## 2020-04-18 DIAGNOSIS — M5116 Intervertebral disc disorders with radiculopathy, lumbar region: Secondary | ICD-10-CM | POA: Diagnosis not present

## 2020-04-18 MED ORDER — IOPAMIDOL (ISOVUE-M 200) INJECTION 41%
1.0000 mL | Freq: Once | INTRAMUSCULAR | Status: AC
  Administered 2020-04-18: 1 mL via EPIDURAL

## 2020-04-18 MED ORDER — METHYLPREDNISOLONE ACETATE 40 MG/ML INJ SUSP (RADIOLOG
120.0000 mg | Freq: Once | INTRAMUSCULAR | Status: AC
  Administered 2020-04-18: 120 mg via EPIDURAL

## 2020-04-18 NOTE — Discharge Instructions (Signed)

## 2020-04-30 ENCOUNTER — Telehealth: Payer: Self-pay | Admitting: Internal Medicine

## 2020-04-30 NOTE — Progress Notes (Signed)
  Chronic Care Management   Outreach Note  04/30/2020 Name: Kristina Ayala MRN: [id number] DOB: 27-Aug-1945  Referred by: [name], MD Reason for referral : No chief complaint on file.   An unsuccessful telephone outreach was attempted today. The patient was referred to the pharmacist for assistance with care management and care coordination.   Follow Up Plan:   Carley Perdue [employer] Scheduler

## 2020-05-02 ENCOUNTER — Telehealth: Payer: Self-pay | Admitting: Internal Medicine

## 2020-05-02 NOTE — Progress Notes (Signed)
  Chronic Care Management   Note  05/02/2020 Name: Kristina Ayala MRN: [id number] DOB: Nov 04, 1945  Kristina Ayala is a 75 y.o. year old female who is a primary care patient of Burns, Claudina Lick, MD. I reached out to [employer] by phone today in response to a referral sent by Kristina Ayala's PCP, Quay Burow, Claudina Lick, MD.   Kristina Ayala was given information about Chronic Care Management services today including:  1. CCM service includes personalized support from designated clinical staff supervised by her physician, including individualized plan of care and coordination with other care providers 2. 24/7 contact phone numbers for assistance for urgent and routine care needs. 3. Service will only be billed when office clinical staff spend 20 minutes or more in a month to coordinate care. 4. Only one practitioner may furnish and bill the service in a calendar month. 5. The patient may stop CCM services at any time (effective at the end of the month) by phone call to the office staff.   Patient agreed to services and verbal consent obtained.   Follow up plan:   Carley Perdue [employer] Scheduler

## 2020-05-13 ENCOUNTER — Other Ambulatory Visit: Payer: Self-pay | Admitting: Internal Medicine

## 2020-05-21 ENCOUNTER — Other Ambulatory Visit: Payer: Self-pay

## 2020-05-21 ENCOUNTER — Encounter: Payer: Self-pay | Admitting: Registered"

## 2020-05-21 ENCOUNTER — Encounter: Payer: Medicare Other | Attending: Internal Medicine | Admitting: Registered"

## 2020-05-21 DIAGNOSIS — E119 Type 2 diabetes mellitus without complications: Secondary | ICD-10-CM | POA: Diagnosis not present

## 2020-05-21 NOTE — Progress Notes (Signed)
Diabetes Self-Management Education  Visit Type:  Follow-up  Appt. Start Time: 10:00 Appt. End Time: 10:40  05/21/2020  [name], identified by name and date of birth, is a 75 y.o. female with a diagnosis of Diabetes: Type 2.   ASSESSMENT  States things have been very stressful since previous visit. States she has been trying to eat the right things since previous visit. States she had a fall since our previous visit. Has upcoming appointment with sports medicine doctor on 5/4.  States she hasn't been able to register for water aerobics yet but has been moving around each day with ADLs.   There were no vitals taken for this visit. There is no height or weight on file to calculate BMI.    Diabetes Self-Management Education - 67/34/19 3790      Complications   How often do you check your blood sugar? 0 times/day (not testing)    Number of hypoglycemic episodes per month 0    Number of hyperglycemic episodes per week 0    Are you checking your feet? No      Dietary Intake   Breakfast Ensure    Lunch skipped    Dinner chuck roast + green beans + creamed corn    Beverage(s) water, Ensure, tea (with ginger root, apple cider vinegar, lemon, honey), coffee      Exercise   Exercise Type ADL's      Patient Education   Physical activity and exercise  Role of exercise on diabetes management, blood pressure control and cardiac health.    Medications Other (comment)   pt is not taking diabetes medications   Chronic complications Assessed and discussed foot care and prevention of foot problems;Retinopathy and reason for yearly dilated eye exams;Nephropathy, what it is, prevention of, the use of ACE, ARB's and early detection of through urine microalbumia.;Reviewed with patient heart disease, higher risk of, and prevention    Personal strategies to promote health Lifestyle issues that need to be addressed for better diabetes care      Individualized Goals (developed by patient)    Nutrition Follow meal plan discussed;General guidelines for healthy choices and portions discussed    Problem Solving seek mental helath counselor for family relationship challenges      Post-Education Assessment   Patient undertands incorporating physical activity into lifestyle. Demonstrates understanding / competency    Patient understands using medications safely. Demonstrates understanding / competency    Patient understands prevention, detection, and treatment of chronic complications. Demonstrates understanding / competency      Outcomes   Program Status Completed      Subsequent Visit   Since your last visit have you continued or begun to take your medications as prescribed? Yes    Since your last visit have you had your blood pressure checked? No    Since your last visit have you experienced any weight changes? No change    Since your last visit, are you checking your blood glucose at least once a day? No           Learning Objective:  Patient will have a greater understanding of diabetes self-management. Patient education plan is to attend individual and/or group sessions per assessed needs and concerns.   Plan:   There are no Patient Instructions on file for this visit.   Expected Outcomes:  Demonstrated interest in learning. Expect positive outcomes  Education material provided: ADA - How to Thrive: A Guide for Your Journey with Diabetes  If  problems or questions, patient to contact team via:  Phone and Email  Future DSME appointment: - PRN

## 2020-05-22 NOTE — Progress Notes (Signed)
Subjective:    Patient ID: Kristina Ayala, female    DOB: [birth year], 75 y.o.   MRN: [id number]  HPI The patient is here for an acute visit.  Not sleeping well, ? Sleeping clinic referral ? -   4 days ago she was going to the dumpster and fell.  She has a h/o falls.  She denies any major injuries.  She was able to get on her own.  She is doing some physical therapy through sports medicine and does not feel like she needs any additional physical therapy  She has been told her snoring is terrible by friends in the past.  She lives alone.  She often falls a sleep around 7:30-8 pm and falls asleep easily, wakes up for a while around 12 and falls back asleep back at 2.  She often watches Tv or looks at her phone.   She also has RLS intermittently and that sometimes wakes her up.   Feels tired around 2pm.  On occasion she will take a nap, but it ends up being a few hours.  She denies waking up gasping or choking in the middle of the night.     Medications and allergies reviewed with patient and updated if appropriate.  Patient Active Problem List   Diagnosis Date Noted  . Cough 12/04/2019  . Pancreatic mass 11/08/2019  . DOE (dyspnea on exertion) 03/20/2019  . Chronic left SI joint pain 03/13/2019  . Leg cramping 01/10/2019  . Osteoarthritis 01/10/2019  . Right lower quadrant abdominal pain 11/09/2018  . Adnexal mass, right 11/09/2018  . Change in stool 10/18/2018  . Lung nodules 06/19/2018  . Abnormal findings on diagnostic imaging of lung 06/08/2018  . Thyromegaly 10/28/2017  . Restless leg syndrome 10/12/2017  . Greater trochanteric bursitis of left hip 07/26/2017  . Hypokalemia 04/18/2017  . Degenerative arthritis of left knee 12/01/2016  . Anxiety 12/01/2016  . Diabetes mellitus without complication ([place]) 70/48/8891  . Bursitis of right shoulder 09/04/2015  . Lateral meniscus derangement 06/03/2015  . Tear of LCL (lateral collateral ligament) of knee 06/03/2015  .  Lumbar radiculopathy 09/19/2014  . Low back pain 01/04/2014  . Bilateral knee pain 01/04/2014  . Diverticulosis of colon without hemorrhage 07/26/2013  . Trigger thumb of left hand 03/06/2013  . Essential hypertension, benign 02/15/2013  . Hyperlipidemia 09/28/2012  . COPD (chronic obstructive pulmonary disease) ([place]) 05/22/2012  . Osteoporosis 05/22/2012    Current Outpatient Medications on File Prior to Visit  Medication Sig Dispense Refill  . ALPRAZolam (XANAX) 0.5 MG tablet TAKE 1 TABLET(0.5 MG) BY MOUTH AT BEDTIME AS NEEDED FOR ANXIETY 30 tablet 0  . amLODipine (NORVASC) 5 MG tablet Take 1.5 tablets (7.5 mg total) by mouth daily. 135 tablet 1  . aspirin EC 81 MG tablet Take 81 mg by mouth daily.    [name] atorvastatin (LIPITOR) 40 MG tablet Take 1 tablet (40 mg total) by mouth every other day. 45 tablet 1  . Cholecalciferol (VITAMIN D3) 10000 units TABS Take 1 tablet by mouth once a week.     . Cyanocobalamin (VITAMIN B-12) 2500 MCG SUBL Place 1 tablet under the tongue daily.    [name] denosumab (PROLIA) 60 MG/ML SOSY injection INJECT 60 MG INTO THE SKIN ONCE FOR 1 DOSE 1 mL 0  . diphenoxylate-atropine (LOMOTIL) 2.5-0.025 MG tablet Take 1 tablet by mouth 4 (four) times daily as needed for diarrhea or loose stools. 15 tablet 0  . ferrous sulfate 325 (65  FE) MG tablet Take 1 tablet (325 mg total) by mouth daily with breakfast. 30 tablet 3  . gabapentin (NEURONTIN) 100 MG capsule Take 2 capsules (200 mg total) by mouth at bedtime. 180 capsule 0  . hydrochlorothiazide (HYDRODIURIL) 25 MG tablet TAKE 1 TABLET(25 MG) BY MOUTH EVERY MORNING 90 tablet 3  . lisinopril (ZESTRIL) 40 MG tablet TAKE 1 TABLET(40 MG) BY MOUTH DAILY 90 tablet 1  . Magnesium 100 MG CAPS Take 100 mg by mouth daily.    . potassium chloride (KLOR-CON) 8 MEQ tablet TAKE 2 TABLETS(16 MEQ) BY MOUTH DAILY 180 tablet 1  . tiZANidine (ZANAFLEX) 2 MG tablet Take 1 tablet (2 mg total) by mouth at bedtime. 30 tablet 0  . TURMERIC PO Take  1 tablet by mouth daily.     No current facility-administered medications on file prior to visit.    Past Medical History:  Diagnosis Date  . Arthritis   . Bursitis    left elbow  . Clostridium difficile infection   . Diabetes mellitus without complication ([place])   . Frequent falls   . HLD (hyperlipidemia)   . Hypertension   . Infectious colitis     Past Surgical History:  Procedure Laterality Date  . CATARACT EXTRACTION Bilateral   . COLONOSCOPY    . ORIF PROXIMAL TIBIAL PLATEAU FRACTURE Left 02/19/2012   knee  . POLYPECTOMY    . TONSILLECTOMY      Social History   Socioeconomic History  . Marital status: Divorced    Spouse name: Not on file  . Number of children: 2  . Years of education: Not on file  . Highest education level: Not on file  Occupational History  . Occupation: RETIRED  Tobacco Use  . Smoking status: Former Smoker    Packs/day: 0.25    Years: 50.00    Pack years: 12.50    Types: Cigarettes    Quit date: 08/28/2014    Years since quitting: 5.7  . Smokeless tobacco: Never Used  . Tobacco comment: 1 pack every 3 days  Vaping Use  . Vaping Use: Never used  Substance and Sexual Activity  . Alcohol use: No    Alcohol/week: 0.0 standard drinks  . Drug use: No  . Sexual activity: Not Currently  Other Topics Concern  . Not on file  Social History Narrative   Lives alone has 2 supportive children.      Social Determinants of Health   Financial Resource Strain: Low Risk   . Difficulty of Paying Living Expenses: Not hard at all  Food Insecurity: No Food Insecurity  . Worried About Charity fundraiser in the Last Year: Never true  . Ran Out of Food in the Last Year: Never true  Transportation Needs: No Transportation Needs  . Lack of Transportation (Medical): No  . Lack of Transportation (Non-Medical): No  Physical Activity: Sufficiently Active  . Days of Exercise per Week: 5 days  . Minutes of Exercise per Session: 30 min  Stress: No Stress  Concern Present  . Feeling of Stress : Not at all  Social Connections: Moderately Integrated  . Frequency of Communication with Friends and Family: More than three times a week  . Frequency of Social Gatherings with Friends and Family: More than three times a week  . Attends Religious Services: More than 4 times per year  . Active Member of Clubs or Organizations: Yes  . Attends Archivist Meetings: More than 4 times per year  .  Marital Status: Widowed    Family History  Problem Relation Age of Onset  . Diverticulosis Mother   . Ovarian cancer Mother   . Pancreatic cancer Father   . Lung cancer Father   . Colon polyps Sister   . Colon cancer Neg Hx     Review of Systems     Objective:   Vitals:   05/23/20 0847  BP: 134/84  Pulse: 89  Temp: 98.6 F (37 C)  SpO2: 98%   BP Readings from Last 3 Encounters:  05/23/20 134/84  04/18/20 133/74  03/13/20 138/76   Wt Readings from Last 3 Encounters:  05/23/20 168 lb 9.6 oz (76.5 kg)  03/13/20 166 lb 3.2 oz (75.4 kg)  03/03/20 166 lb 3.2 oz (75.4 kg)   Body mass index is 28.94 kg/m.   Physical Exam    Constitutional: Appears well-developed and well-nourished. No distress.  Head: Normocephalic and atraumatic.  Mouth: small oropharynx Cardiovascular: Normal rate, regular rhythm and normal heart sounds.  No murmur heard. No carotid bruit .  No edema Pulmonary/Chest: Effort normal and breath sounds normal. No respiratory distress. No has no wheezes. No rales.  Skin: Skin is warm and dry. Not diaphoretic.  Psychiatric: Normal mood and affect. Behavior is normal.        Assessment & Plan:    See Problem List for Assessment and Plan of chronic medical problems.    This visit occurred during the SARS-CoV-2 public health emergency.  Safety protocols were in place, including screening questions prior to the visit, additional usage of staff PPE, and extensive cleaning of exam room while observing appropriate  contact time as indicated for disinfecting solutions.

## 2020-05-23 ENCOUNTER — Other Ambulatory Visit: Payer: Self-pay

## 2020-05-23 ENCOUNTER — Ambulatory Visit (INDEPENDENT_AMBULATORY_CARE_PROVIDER_SITE_OTHER): Payer: Medicare Other | Admitting: Internal Medicine

## 2020-05-23 ENCOUNTER — Encounter: Payer: Self-pay | Admitting: Internal Medicine

## 2020-05-23 VITALS — BP 134/84 | HR 89 | Temp 98.6°F | Ht 64.0 in | Wt 168.6 lb

## 2020-05-23 DIAGNOSIS — R0683 Snoring: Secondary | ICD-10-CM | POA: Diagnosis not present

## 2020-05-23 NOTE — Patient Instructions (Signed)
   A referral was ordered for neurology.        Someone from their office will call you to schedule an appointment.

## 2020-05-23 NOTE — Assessment & Plan Note (Signed)
New problem She has been told that she snores a lot She does not have the best sleep hygiene and its difficult to tell the sleep quality Concern for obstructive sleep apnea-we will refer to neurology for further testing Advised that if she wakes up in the middle of the night not to watch TV or look at her phone-get out of bed if she cannot fall asleep within 30 minutes, read for short period of time and then try to go back to bed

## 2020-05-28 NOTE — Progress Notes (Signed)
  Kristina Ayala   Patient was to follow-up in office for the epidural follow-up.  Patient states that the epidural is doing better.  Patient does have a cold and is awaiting call from primary care provider.  We did discuss with patient if COVID gets worse she should seek medical attention and potentially from an urgent care.  Patient states that it is more just a head cold and not feeling significantly better at the moment.  Patient does sound congested.  We discussed if any shortness of breath, chest pain should seek medical attention immediately.  Otherwise patient can follow-up with me again in 6 weeks to see how she is doing.  This will be regarding her back

## 2020-05-29 ENCOUNTER — Ambulatory Visit: Payer: Medicare Other | Admitting: Family Medicine

## 2020-05-29 ENCOUNTER — Telehealth (INDEPENDENT_AMBULATORY_CARE_PROVIDER_SITE_OTHER): Payer: Medicare Other | Admitting: Family Medicine

## 2020-05-29 ENCOUNTER — Encounter: Payer: Self-pay | Admitting: Family Medicine

## 2020-05-29 DIAGNOSIS — R0981 Nasal congestion: Secondary | ICD-10-CM

## 2020-05-29 DIAGNOSIS — R059 Cough, unspecified: Secondary | ICD-10-CM

## 2020-05-29 DIAGNOSIS — M5416 Radiculopathy, lumbar region: Secondary | ICD-10-CM

## 2020-05-29 MED ORDER — BENZONATATE 100 MG PO CAPS: 100.0000 mg | ORAL_CAPSULE | Freq: Three times a day (TID) | ORAL | 0 refills | Status: DC | PRN

## 2020-05-29 NOTE — Patient Instructions (Signed)
  HOME CARE TIPS:  -[facility] COVID19 testing information: [email or web address] OR [phone] Most pharmacies also offer testing and home test kits. If the Covid19 test is positive, please make a prompt follow up visit with your primary care office or with [facility] to discuss treatment options. Treatments for Covid19 are best given early in the course of the illness.   -I sent the medication(s) we discussed to your pharmacy: Meds ordered this encounter  Medications   benzonatate (TESSALON PERLES) 100 MG capsule    Sig: Take 1 capsule (100 mg total) by mouth 3 (three) times daily as needed.    Dispense:  20 capsule    Refill:  0    -can use tylenol if needed for fevers, aches and pains per instructions  -can use nasal saline a few times per day if you have nasal congestion; sometimes  a short course of Afrin nasal spray for 3 days can help with symptoms as well  -stay hydrated, drink plenty of fluids and eat small healthy meals - avoid dairy  -can take 1000 IU (25mcg) Vit D3 and 100-500 mg of Vit C daily per instructions  -If the Covid test is positive, check out the CDC website for more information on home care, transmission and treatment for COVID19  -follow up with your doctor in 2-3 days unless improving and feeling better  -stay home while sick, except to seek medical care. If you have COVID19, ideally it would be best to stay home for a full 10 days since the onset of symptoms PLUS one day of no fever and feeling better. Wear a good mask that fits snugly (such as N95 or KN95) if around others to reduce the risk of transmission.  It was nice to meet you today, and I really hope you are feeling better soon. I help [facility] out with telemedicine visits on Tuesdays and Thursdays and am available for visits on those days. If you have any concerns or questions following this visit please schedule a follow up visit with your Primary Care doctor  or seek care at a local urgent care clinic to avoid delays in care.    Seek in person care or schedule a follow up video visit promptly if your symptoms worsen, new concerns arise or you are not improving with treatment. Call 911 and/or seek emergency care if your symptoms are severe or life threatening.   

## 2020-05-29 NOTE — Progress Notes (Signed)
Virtual Visit via Video Note  I connected with Kristina Ayala  on 05/29/20 at  1:20 PM EDT by a video enabled telemedicine application and verified that I am speaking with the correct person using two identifiers.  Location patient: home, [place] Location provider:work or home office Persons participating in the virtual visit: patient, provider  I discussed the limitations of evaluation and management by telemedicine and the availability of in person appointments. The patient expressed understanding and agreed to proceed.   HPI:  Acute telemedicine visit for cough and congestion: -Onset: 2 days ago -Symptoms include: sore throat, cough, nasal congestion, headaches -Denies: fever, CP, SOB, NVD, inability to eat/drink/get out of bed, known sick contacts -Has tried: zyrtec, tylenol -Pertinent past medical history: see below -Pertinent medication allergies:  Allergies  Allergen Reactions  . Doxycycline Diarrhea  . Minocycline Diarrhea  . Wellbutrin [Bupropion] Swelling    Per pt her tongue was swollen and sore/symptoms stopped once the medication was discontinued.   [name] Penicillins Rash    Has patient had a PCN reaction causing immediate rash, facial/tongue/throat swelling, SOB or lightheadedness with hypotension: yes Has patient had a PCN reaction causing severe rash involving mucus membranes or skin necrosis: no Has patient had a PCN reaction that required hospitalization: unknown Has patient had a PCN reaction occurring within the last 10 years: no If all of the above answers are "NO", then may proceed with Cephalosporin use.   [name] Z-Pak [Azithromycin] Other (See Comments)    diarrhea   -COVID-19 vaccine status: fully vaccinated + 2 booster and flu shot  ROS: See pertinent positives and negatives per HPI.  Past Medical History:  Diagnosis Date  . Arthritis   . Bursitis    left elbow  . Clostridium difficile infection   . Diabetes mellitus without complication ([place])   . Frequent falls    . HLD (hyperlipidemia)   . Hypertension   . Infectious colitis     Past Surgical History:  Procedure Laterality Date  . CATARACT EXTRACTION Bilateral   . COLONOSCOPY    . ORIF PROXIMAL TIBIAL PLATEAU FRACTURE Left 02/19/2012   knee  . POLYPECTOMY    . TONSILLECTOMY       Current Outpatient Medications:  .  benzonatate (TESSALON PERLES) 100 MG capsule, Take 1 capsule (100 mg total) by mouth 3 (three) times daily as needed., Disp: 20 capsule, Rfl: 0 .  ALPRAZolam (XANAX) 0.5 MG tablet, TAKE 1 TABLET(0.5 MG) BY MOUTH AT BEDTIME AS NEEDED FOR ANXIETY, Disp: 30 tablet, Rfl: 0 .  amLODipine (NORVASC) 5 MG tablet, Take 1.5 tablets (7.5 mg total) by mouth daily., Disp: 135 tablet, Rfl: 1 .  aspirin EC 81 MG tablet, Take 81 mg by mouth daily., Disp: , Rfl:  .  atorvastatin (LIPITOR) 40 MG tablet, Take 1 tablet (40 mg total) by mouth every other day., Disp: 45 tablet, Rfl: 1 .  Cholecalciferol (VITAMIN D3) 10000 units TABS, Take 1 tablet by mouth once a week. , Disp: , Rfl:  .  Cyanocobalamin (VITAMIN B-12) 2500 MCG SUBL, Place 1 tablet under the tongue daily., Disp: , Rfl:  .  denosumab (PROLIA) 60 MG/ML SOSY injection, INJECT 60 MG INTO THE SKIN ONCE FOR 1 DOSE, Disp: 1 mL, Rfl: 0 .  diphenoxylate-atropine (LOMOTIL) 2.5-0.025 MG tablet, Take 1 tablet by mouth 4 (four) times daily as needed for diarrhea or loose stools., Disp: 15 tablet, Rfl: 0 .  ferrous sulfate 325 (65 FE) MG tablet, Take 1 tablet (325 mg total)  by mouth daily with breakfast., Disp: 30 tablet, Rfl: 3 .  gabapentin (NEURONTIN) 100 MG capsule, Take 2 capsules (200 mg total) by mouth at bedtime., Disp: 180 capsule, Rfl: 0 .  hydrochlorothiazide (HYDRODIURIL) 25 MG tablet, TAKE 1 TABLET(25 MG) BY MOUTH EVERY MORNING, Disp: 90 tablet, Rfl: 3 .  lisinopril (ZESTRIL) 40 MG tablet, TAKE 1 TABLET(40 MG) BY MOUTH DAILY, Disp: 90 tablet, Rfl: 1 .  Magnesium 100 MG CAPS, Take 100 mg by mouth daily., Disp: , Rfl:  .  potassium chloride  (KLOR-CON) 8 MEQ tablet, TAKE 2 TABLETS(16 MEQ) BY MOUTH DAILY, Disp: 180 tablet, Rfl: 1 .  tiZANidine (ZANAFLEX) 2 MG tablet, Take 1 tablet (2 mg total) by mouth at bedtime., Disp: 30 tablet, Rfl: 0 .  TURMERIC PO, Take 1 tablet by mouth daily., Disp: , Rfl:   EXAM:  VITALS per patient if applicable:  GENERAL: alert, oriented, appears well and in no acute distress  HEENT: atraumatic, conjunttiva clear, no obvious abnormalities on inspection of external nose and ears  NECK: normal movements of the head and neck  LUNGS: on inspection no signs of respiratory distress, breathing rate appears normal, no obvious gross SOB, gasping or wheezing  CV: no obvious cyanosis  MS: moves all visible extremities without noticeable abnormality  PSYCH/NEURO: pleasant and cooperative, no obvious depression or anxiety, speech and thought processing grossly intact  ASSESSMENT AND PLAN:  Discussed the following assessment and plan:  Nasal congestion  Cough  -we discussed possible serious and likely etiologies, options for evaluation and workup, limitations of telemedicine visit vs in person visit, treatment, treatment risks and precautions. Pt prefers to treat via telemedicine empirically rather than in person at this moment.  Query viral upper respiratory illness, possible COVID-19 versus other.  She opted for COVID testing and plans to go to the pharmacy for this.  She also opted for Tessalon for cough, nasal saline and a short course of nasal decongestant and analgesic as needed.  Mother home care recommendations summarized in patient instructions.  Did advise if she has a COVID test schedule follow-up video visit with her primary care office or with [facility] to discuss treatment options. Scheduled follow up with PCP offered: Agrees to follow-up as needed. Advised to seek prompt in person care if worsening, new symptoms arise, or if is not improving with treatment. Discussed options for inperson care  if PCP office not available.    I discussed the assessment and treatment plan with the patient. The patient was provided an opportunity to ask questions and all were answered. The patient agreed with the plan and demonstrated an understanding of the instructions.     Lucretia Kern, DO

## 2020-06-02 ENCOUNTER — Telehealth: Payer: Self-pay | Admitting: Internal Medicine

## 2020-06-02 NOTE — Telephone Encounter (Signed)
  Seeking advice  Patient calling to request advice for " head cold" Patient had virtual appointment with Dr Maudie Mercury last week, not feeling any better. Seeking advice She is currently taking several  OTC medications including Mucinex   Pharmacy: [facility], [address]

## 2020-06-02 NOTE — Telephone Encounter (Signed)
I would advise getting a covid test if she has not had one  - if neg can come in for an appt

## 2020-06-02 NOTE — Telephone Encounter (Signed)
Appointment made for tomorrow at 10:20 am.

## 2020-06-02 NOTE — Progress Notes (Signed)
Subjective:    Patient ID: Kristina Ayala, female    DOB: 1945/08/17, 75 y.o.   MRN: [id number]  HPI She is here for an acute visit for cold symptoms.   Her symptoms started around 5/3.  She had a VV with Dr Maudie Mercury and was advised symptomatic treatment for viral URI.  She advised a home covid test.   She is experiencing nasal congestion, ear popping, productive cough and headaches.   The sputum that she is coughing up is thick and dark yellow.  Her symptoms continue to get worse, which is concerning for her.  Nothing seems to be helping.  She denies any shortness of breath, wheezing and fevers.   She has tried taking Tylenol, alka seltzer, zyrtec, tessalon perles   covid test negative 05/29/20   Medications and allergies reviewed with patient and updated if appropriate.  Patient Active Problem List   Diagnosis Date Noted  . Snoring 05/23/2020  . Cough 12/04/2019  . Pancreatic mass 11/08/2019  . DOE (dyspnea on exertion) 03/20/2019  . Chronic left SI joint pain 03/13/2019  . Leg cramping 01/10/2019  . Osteoarthritis 01/10/2019  . Right lower quadrant abdominal pain 11/09/2018  . Adnexal mass, right 11/09/2018  . Change in stool 10/18/2018  . Lung nodules 06/19/2018  . Abnormal findings on diagnostic imaging of lung 06/08/2018  . Thyromegaly 10/28/2017  . Restless leg syndrome 10/12/2017  . Greater trochanteric bursitis of left hip 07/26/2017  . Hypokalemia 04/18/2017  . Degenerative arthritis of left knee 12/01/2016  . Anxiety 12/01/2016  . Diabetes mellitus without complication ([place]) 73/71/0626  . Bursitis of right shoulder 09/04/2015  . Lateral meniscus derangement 06/03/2015  . Tear of LCL (lateral collateral ligament) of knee 06/03/2015  . Lumbar radiculopathy 09/19/2014  . Low back pain 01/04/2014  . Bilateral knee pain 01/04/2014  . Diverticulosis of colon without hemorrhage 07/26/2013  . Trigger thumb of left hand 03/06/2013  . Essential hypertension, benign  02/15/2013  . Hyperlipidemia 09/28/2012  . COPD (chronic obstructive pulmonary disease) ([place]) 05/22/2012  . Osteoporosis 05/22/2012    Current Outpatient Medications on File Prior to Visit  Medication Sig Dispense Refill  . ALPRAZolam (XANAX) 0.5 MG tablet TAKE 1 TABLET(0.5 MG) BY MOUTH AT BEDTIME AS NEEDED FOR ANXIETY 30 tablet 0  . amLODipine (NORVASC) 5 MG tablet Take 1.5 tablets (7.5 mg total) by mouth daily. 135 tablet 1  . aspirin EC 81 MG tablet Take 81 mg by mouth daily.    [name] atorvastatin (LIPITOR) 40 MG tablet Take 1 tablet (40 mg total) by mouth every other day. 45 tablet 1  . benzonatate (TESSALON PERLES) 100 MG capsule Take 1 capsule (100 mg total) by mouth 3 (three) times daily as needed. 20 capsule 0  . Cholecalciferol (VITAMIN D3) 10000 units TABS Take 1 tablet by mouth once a week.     . Cyanocobalamin (VITAMIN B-12) 2500 MCG SUBL Place 1 tablet under the tongue daily.    [name] denosumab (PROLIA) 60 MG/ML SOSY injection INJECT 60 MG INTO THE SKIN ONCE FOR 1 DOSE 1 mL 0  . diphenoxylate-atropine (LOMOTIL) 2.5-0.025 MG tablet Take 1 tablet by mouth 4 (four) times daily as needed for diarrhea or loose stools. 15 tablet 0  . ferrous sulfate 325 (65 FE) MG tablet Take 1 tablet (325 mg total) by mouth daily with breakfast. 30 tablet 3  . gabapentin (NEURONTIN) 100 MG capsule Take 2 capsules (200 mg total) by mouth at bedtime. 180 capsule 0  .  hydrochlorothiazide (HYDRODIURIL) 25 MG tablet TAKE 1 TABLET(25 MG) BY MOUTH EVERY MORNING 90 tablet 3  . lisinopril (ZESTRIL) 40 MG tablet TAKE 1 TABLET(40 MG) BY MOUTH DAILY 90 tablet 1  . Magnesium 100 MG CAPS Take 100 mg by mouth daily.    . potassium chloride (KLOR-CON) 8 MEQ tablet TAKE 2 TABLETS(16 MEQ) BY MOUTH DAILY 180 tablet 1  . tiZANidine (ZANAFLEX) 2 MG tablet Take 1 tablet (2 mg total) by mouth at bedtime. 30 tablet 0  . TURMERIC PO Take 1 tablet by mouth daily.     No current facility-administered medications on file prior to  visit.    Past Medical History:  Diagnosis Date  . Arthritis   . Bursitis    left elbow  . Clostridium difficile infection   . Diabetes mellitus without complication ([place])   . Frequent falls   . HLD (hyperlipidemia)   . Hypertension   . Infectious colitis     Past Surgical History:  Procedure Laterality Date  . CATARACT EXTRACTION Bilateral   . COLONOSCOPY    . ORIF PROXIMAL TIBIAL PLATEAU FRACTURE Left 02/19/2012   knee  . POLYPECTOMY    . TONSILLECTOMY      Social History   Socioeconomic History  . Marital status: Divorced    Spouse name: Not on file  . Number of children: 2  . Years of education: Not on file  . Highest education level: Not on file  Occupational History  . Occupation: RETIRED  Tobacco Use  . Smoking status: Former Smoker    Packs/day: 0.25    Years: 50.00    Pack years: 12.50    Types: Cigarettes    Quit date: 08/28/2014    Years since quitting: 5.7  . Smokeless tobacco: Never Used  . Tobacco comment: 1 pack every 3 days  Vaping Use  . Vaping Use: Never used  Substance and Sexual Activity  . Alcohol use: No    Alcohol/week: 0.0 standard drinks  . Drug use: No  . Sexual activity: Not Currently  Other Topics Concern  . Not on file  Social History Narrative   Lives alone has 2 supportive children.      Social Determinants of Health   Financial Resource Strain: Low Risk   . Difficulty of Paying Living Expenses: Not hard at all  Food Insecurity: No Food Insecurity  . Worried About Charity fundraiser in the Last Year: Never true  . Ran Out of Food in the Last Year: Never true  Transportation Needs: No Transportation Needs  . Lack of Transportation (Medical): No  . Lack of Transportation (Non-Medical): No  Physical Activity: Sufficiently Active  . Days of Exercise per Week: 5 days  . Minutes of Exercise per Session: 30 min  Stress: No Stress Concern Present  . Feeling of Stress : Not at all  Social Connections: Moderately Integrated   . Frequency of Communication with Friends and Family: More than three times a week  . Frequency of Social Gatherings with Friends and Family: More than three times a week  . Attends Religious Services: More than 4 times per year  . Active Member of Clubs or Organizations: Yes  . Attends Archivist Meetings: More than 4 times per year  . Marital Status: Widowed    Family History  Problem Relation Age of Onset  . Diverticulosis Mother   . Ovarian cancer Mother   . Pancreatic cancer Father   . Lung cancer Father   .  Colon polyps Sister   . Colon cancer Neg Hx     Review of Systems  Constitutional: Negative for chills and fever.  HENT: Positive for congestion. Negative for ear pain (ear popping), sinus pressure, sinus pain and sore throat (had it initially).   Respiratory: Positive for cough (productive). Negative for chest tightness, shortness of breath and wheezing.   Cardiovascular: Negative for chest pain.  Gastrointestinal: Negative for abdominal pain and nausea.  Musculoskeletal: Negative for myalgias.  Neurological: Positive for headaches. Negative for dizziness and light-headedness.       Objective:   Vitals:   06/03/20 1019  BP: 104/60  Pulse: 97  Temp: 98.6 F (37 C)  SpO2: 97%   BP Readings from Last 3 Encounters:  06/03/20 104/60  05/23/20 134/84  04/18/20 133/74   Wt Readings from Last 3 Encounters:  06/03/20 169 lb 3.2 oz (76.7 kg)  05/23/20 168 lb 9.6 oz (76.5 kg)  03/13/20 166 lb 3.2 oz (75.4 kg)   Body mass index is 29.04 kg/m.   Physical Exam    GENERAL APPEARANCE: Appears stated age, well appearing, NAD EYES: conjunctiva clear, no icterus HENT: bilateral tympanic membranes and ear canals normal, oropharynx with no erythema or exudates, trachea midline, no cervical or supraclavicular lymphadenopathy LUNGS: Unlabored breathing, good air entry bilaterally, clear to auscultation without wheeze or crackles CARDIOVASCULAR: Normal S1,S2 ,  no edema SKIN: Warm, dry      Assessment & Plan:    See Problem List for Assessment and Plan of chronic medical problems.    This visit occurred during the SARS-CoV-2 public health emergency.  Safety protocols were in place, including screening questions prior to the visit, additional usage of staff PPE, and extensive cleaning of exam room while observing appropriate contact time as indicated for disinfecting solutions.

## 2020-06-03 ENCOUNTER — Encounter: Payer: Self-pay | Admitting: Internal Medicine

## 2020-06-03 ENCOUNTER — Ambulatory Visit (INDEPENDENT_AMBULATORY_CARE_PROVIDER_SITE_OTHER): Payer: Medicare Other | Admitting: Internal Medicine

## 2020-06-03 ENCOUNTER — Other Ambulatory Visit: Payer: Self-pay

## 2020-06-03 DIAGNOSIS — J209 Acute bronchitis, unspecified: Secondary | ICD-10-CM | POA: Diagnosis not present

## 2020-06-03 MED ORDER — CEPHALEXIN 500 MG PO CAPS: 500.0000 mg | ORAL_CAPSULE | Freq: Three times a day (TID) | ORAL | 0 refills | Status: AC

## 2020-06-03 NOTE — Patient Instructions (Signed)
Take the antibiotic as prescribed - complete the entire course.  Use the otc cough syrup as needed.   Continue over the counter cold medication, advil and tylenol.  Increase your fluids and rest.    Call if no improvement

## 2020-06-03 NOTE — Assessment & Plan Note (Signed)
Acute Symptoms concerning for developing acute bronchitis Has COPD, but without assassination Start Keflex 500 mg 3 times daily x10 days Continue over-the-counter cold medications Continue increase fluid and rest Call if no improvement

## 2020-06-04 ENCOUNTER — Other Ambulatory Visit: Payer: Self-pay | Admitting: Internal Medicine

## 2020-06-05 ENCOUNTER — Telehealth: Payer: Self-pay | Admitting: Internal Medicine

## 2020-06-05 DIAGNOSIS — R059 Cough, unspecified: Secondary | ICD-10-CM

## 2020-06-05 NOTE — Telephone Encounter (Signed)
30 min earlier should be ok -- I did order it stat

## 2020-06-05 NOTE — Telephone Encounter (Signed)
Can she come in tomorrow?  I can order a CXR for her to prior to me seeing her.

## 2020-06-05 NOTE — Telephone Encounter (Signed)
Patient scheduled for tomorrow.  If you put her order in for chest x-ray I can have her do x-ray prior to visit is she can come in a little earlier for her appointment.

## 2020-06-05 NOTE — Telephone Encounter (Signed)
For her xray how early did you want her to come in prior to her appointment at 2:00 pm for the chest xray?

## 2020-06-05 NOTE — Telephone Encounter (Signed)
Patient was seen on 05.10.22 and she states she is not feeling any better she feels worse, she is worried she has walking pneumonia

## 2020-06-05 NOTE — Patient Instructions (Signed)
  Blood work was ordered.     Medications changes include :     Your prescription(s) have been submitted to your pharmacy. Please take as directed and contact our office if you believe you are having problem(s) with the medication(s).

## 2020-06-05 NOTE — Telephone Encounter (Signed)
X-ray ordered.

## 2020-06-05 NOTE — Telephone Encounter (Signed)
Left message for patient to come around 1:30 pm tomorrow for chest x-ray.

## 2020-06-05 NOTE — Addendum Note (Signed)
Addended by: [name] on: 06/05/2020 01:27 PM   Modules accepted: Orders

## 2020-06-05 NOTE — Progress Notes (Signed)
Subjective:    Patient ID: Kristina Ayala, female    DOB: 1945/12/21, 75 y.o.   MRN: [id number]  HPI She is here for an acute visit for cold symptoms.   Her symptoms started   She is experiencing   She has tried taking     Medications and allergies reviewed with patient and updated if appropriate.  Patient Active Problem List   Diagnosis Date Noted   Snoring 05/23/2020   Cough 12/04/2019   Pancreatic mass 11/08/2019   DOE (dyspnea on exertion) 03/20/2019   Chronic left SI joint pain 03/13/2019   Leg cramping 01/10/2019   Osteoarthritis 01/10/2019   Right lower quadrant abdominal pain 11/09/2018   Adnexal mass, right 11/09/2018   Change in stool 10/18/2018   Lung nodules 06/19/2018   Abnormal findings on diagnostic imaging of lung 06/08/2018   Thyromegaly 10/28/2017   Restless leg syndrome 10/12/2017   Greater trochanteric bursitis of left hip 07/26/2017   Hypokalemia 04/18/2017   Acute bronchitis 12/28/2016   Degenerative arthritis of left knee 12/01/2016   Anxiety 12/01/2016   Diabetes mellitus without complication ([place]) 51/76/1607   Bursitis of right shoulder 09/04/2015   Lateral meniscus derangement 06/03/2015   Tear of LCL (lateral collateral ligament) of knee 06/03/2015   Lumbar radiculopathy 09/19/2014   Low back pain 01/04/2014   Bilateral knee pain 01/04/2014   Diverticulosis of colon without hemorrhage 07/26/2013   Trigger thumb of left hand 03/06/2013   Essential hypertension, benign 02/15/2013   Hyperlipidemia 09/28/2012   COPD (chronic obstructive pulmonary disease) ([place]) 05/22/2012   Osteoporosis 05/22/2012    Current Outpatient Medications on File Prior to Visit  Medication Sig Dispense Refill   ALPRAZolam (XANAX) 0.5 MG tablet TAKE 1 TABLET(0.5 MG) BY MOUTH AT BEDTIME AS NEEDED FOR ANXIETY 30 tablet 0   amLODipine (NORVASC) 5 MG tablet Take 1.5 tablets (7.5 mg total) by mouth daily. 135 tablet 1   aspirin EC 81 MG tablet Take 81 mg by  mouth daily.     atorvastatin (LIPITOR) 40 MG tablet Take 1 tablet (40 mg total) by mouth every other day. 45 tablet 1   benzonatate (TESSALON PERLES) 100 MG capsule Take 1 capsule (100 mg total) by mouth 3 (three) times daily as needed. 20 capsule 0   cephALEXin (KEFLEX) 500 MG capsule Take 1 capsule (500 mg total) by mouth 3 (three) times daily for 10 days. 30 capsule 0   Cholecalciferol (VITAMIN D3) 10000 units TABS Take 1 tablet by mouth once a week.      Cyanocobalamin (VITAMIN B-12) 2500 MCG SUBL Place 1 tablet under the tongue daily.     denosumab (PROLIA) 60 MG/ML SOSY injection INJECT 60 MG INTO THE SKIN ONCE FOR 1 DOSE 1 mL 0   diphenoxylate-atropine (LOMOTIL) 2.5-0.025 MG tablet Take 1 tablet by mouth 4 (four) times daily as needed for diarrhea or loose stools. 15 tablet 0   ferrous sulfate 325 (65 FE) MG tablet Take 1 tablet (325 mg total) by mouth daily with breakfast. 30 tablet 3   gabapentin (NEURONTIN) 100 MG capsule Take 2 capsules (200 mg total) by mouth at bedtime. 180 capsule 0   hydrochlorothiazide (HYDRODIURIL) 25 MG tablet TAKE 1 TABLET(25 MG) BY MOUTH EVERY MORNING 90 tablet 3   lisinopril (ZESTRIL) 40 MG tablet TAKE 1 TABLET(40 MG) BY MOUTH DAILY 90 tablet 1   Magnesium 100 MG CAPS Take 100 mg by mouth daily.     potassium chloride (KLOR-CON) 8  MEQ tablet TAKE 2 TABLETS(16 MEQ) BY MOUTH DAILY 180 tablet 1   tiZANidine (ZANAFLEX) 2 MG tablet Take 1 tablet (2 mg total) by mouth at bedtime. 30 tablet 0   TURMERIC PO Take 1 tablet by mouth daily.     No current facility-administered medications on file prior to visit.    Past Medical History:  Diagnosis Date   Arthritis    Bursitis    left elbow   Clostridium difficile infection    Diabetes mellitus without complication ([facility])    Frequent falls    HLD (hyperlipidemia)    Hypertension    Infectious colitis     Past Surgical History:  Procedure Laterality Date   CATARACT EXTRACTION Bilateral    COLONOSCOPY      ORIF PROXIMAL TIBIAL PLATEAU FRACTURE Left 02/19/2012   knee   POLYPECTOMY     TONSILLECTOMY      Social History   Socioeconomic History   Marital status: Divorced    Spouse name: Not on file   Number of children: 2   Years of education: Not on file   Highest education level: Not on file  Occupational History   Occupation: RETIRED  Tobacco Use   Smoking status: Former Smoker    Packs/day: 0.25    Years: 50.00    Pack years: 12.50    Types: Cigarettes    Quit date: 08/28/2014    Years since quitting: 5.7   Smokeless tobacco: Never Used   Tobacco comment: 1 pack every 3 days  Vaping Use   Vaping Use: Never used  Substance and Sexual Activity   Alcohol use: No    Alcohol/week: 0.0 standard drinks   Drug use: No   Sexual activity: Not Currently  Other Topics Concern   Not on file  Social History Narrative   Lives alone has 2 supportive children.      Social Determinants of Health   Financial Resource Strain: Low Risk    Difficulty of Paying Living Expenses: Not hard at all  Food Insecurity: No Food Insecurity   Worried About Charity fundraiser in the Last Year: Never true   [place] in the Last Year: Never true  Transportation Needs: No Transportation Needs   Lack of Transportation (Medical): No   Lack of Transportation (Non-Medical): No  Physical Activity: Sufficiently Active   Days of Exercise per Week: 5 days   Minutes of Exercise per Session: 30 min  Stress: No Stress Concern Present   Feeling of Stress : Not at all  Social Connections: Moderately Integrated   Frequency of Communication with Friends and Family: More than three times a week   Frequency of Social Gatherings with Friends and Family: More than three times a week   Attends Religious Services: More than 4 times per year   Active Member of [employer] or Organizations: Yes   Attends Archivist Meetings: More than 4 times per year   Marital Status: Widowed    Family History  Problem  Relation Age of Onset   Diverticulosis Mother    Ovarian cancer Mother    Pancreatic cancer Father    Lung cancer Father    Colon polyps Sister    Colon cancer Neg Hx     Review of Systems     Objective:  There were no vitals filed for this visit. BP Readings from Last 3 Encounters:  06/03/20 104/60  05/23/20 134/84  04/18/20 133/74   Wt Readings from  Last 3 Encounters:  06/03/20 169 lb 3.2 oz (76.7 kg)  05/23/20 168 lb 9.6 oz (76.5 kg)  03/13/20 166 lb 3.2 oz (75.4 kg)   There is no height or weight on file to calculate BMI.   Physical Exam    GENERAL APPEARANCE: Appears stated age, well appearing, NAD EYES: conjunctiva clear, no icterus HENT: bilateral tympanic membranes and ear canals normal, oropharynx with no erythema or exudates, trachea midline, no cervical or supraclavicular lymphadenopathy LUNGS: Unlabored breathing, good air entry bilaterally, clear to auscultation without wheeze or crackles CARDIOVASCULAR: Normal S1,S2 , no edema SKIN: Warm, dry      Assessment & Plan:    See Problem List for Assessment and Plan of chronic medical problems.    This visit occurred during the SARS-CoV-2 public health emergency.  Safety protocols were in place, including screening questions prior to the visit, additional usage of staff PPE, and extensive cleaning of exam room while observing appropriate contact time as indicated for disinfecting solutions.  This encounter was created in error - please disregard.

## 2020-06-06 ENCOUNTER — Encounter: Payer: Medicare Other | Admitting: Internal Medicine

## 2020-06-06 NOTE — Telephone Encounter (Signed)
Patient cancelled appointment because she wants to wait the 10 days of the medication before coming back in

## 2020-06-10 DIAGNOSIS — L820 Inflamed seborrheic keratosis: Secondary | ICD-10-CM | POA: Diagnosis not present

## 2020-06-16 ENCOUNTER — Other Ambulatory Visit: Payer: Self-pay

## 2020-06-16 ENCOUNTER — Ambulatory Visit (INDEPENDENT_AMBULATORY_CARE_PROVIDER_SITE_OTHER)
Admission: RE | Admit: 2020-06-16 | Discharge: 2020-06-16 | Disposition: A | Discharge status: Home / Self Care | Payer: Medicare Other | Source: Ambulatory Visit | Attending: Internal Medicine | Admitting: Internal Medicine

## 2020-06-16 DIAGNOSIS — R059 Cough, unspecified: Secondary | ICD-10-CM

## 2020-06-16 MED ORDER — BENZONATATE 100 MG PO CAPS: 100.0000 mg | ORAL_CAPSULE | Freq: Three times a day (TID) | ORAL | 0 refills | Status: DC | PRN

## 2020-06-16 NOTE — Telephone Encounter (Signed)
Refilled tessalon

## 2020-06-16 NOTE — Addendum Note (Signed)
Addended by: [name] on: 06/16/2020 04:09 PM   Modules accepted: Orders

## 2020-06-16 NOTE — Telephone Encounter (Signed)
Patient advised chest xray normal She states she is still coughing, She would like to know if Tessalon should be renewed   Please advise

## 2020-06-18 ENCOUNTER — Telehealth: Payer: Self-pay | Admitting: Pharmacist

## 2020-06-18 NOTE — Progress Notes (Signed)
Chronic Care Management Pharmacy Assistant   Name: Kristina Ayala  MRN: [id number] DOB: [birth year]  Reason for Encounter: Initial Questions Appointment: OV 07/03/20 @ 9 am   Recent office visits:  06/03/20 Burns (PCP) -  Bronchitis. Start Cephalexin 500 mg.  05/23/20 Burns (PCP) - Snoring. Referral to Neurology for sleep study.   03/03/20 Burns (PCP) - Hypertension. Decrease gabapentin to 200 mg. D/c cefdinir, meloxicam & tramadol. Referral to Diabetic Ed. F/u 6 mos.  02/12/20 Burns (PCP) - Video Visit. Bronchitis. Start Cefdinir 300 mg.  Recent consult visits: 05/29/20 Maudie Mercury [facility]) - Nasal congestion. Start Benzonatate 100 mg.   05/29/20 Tamala Julian (Sports Medicine) - Lumbar radiculopathy.   03/13/20 Georgina Snell (Sports Medicine) - f/u left knee pain. Triamcinolone Acetonide Injection given.  02/28/20 Smith (Sports Medicine) - Chronic pain. Start using cane. Increase gabapentin to 400 mg, tizanidine to 2 mg. D/c Vit D. F/u 1 mo.  12/28/19 Henley (GYN) - [facility] visits:  None in previous 6 months  Medications: Outpatient Encounter Medications as of 06/18/2020  Medication Sig Note  . ALPRAZolam (XANAX) 0.5 MG tablet TAKE 1 TABLET(0.5 MG) BY MOUTH AT BEDTIME AS NEEDED FOR ANXIETY   . amLODipine (NORVASC) 5 MG tablet Take 1.5 tablets (7.5 mg total) by mouth daily.   [name] aspirin EC 81 MG tablet Take 81 mg by mouth daily.   [name] atorvastatin (LIPITOR) 40 MG tablet Take 1 tablet (40 mg total) by mouth every other day.   . benzonatate (TESSALON PERLES) 100 MG capsule Take 1 capsule (100 mg total) by mouth 3 (three) times daily as needed.   . Cholecalciferol (VITAMIN D3) 10000 units TABS Take 1 tablet by mouth once a week.    . Cyanocobalamin (VITAMIN B-12) 2500 MCG SUBL Place 1 tablet under the tongue daily.   [name] denosumab (PROLIA) 60 MG/ML SOSY injection INJECT 60 MG INTO THE SKIN ONCE FOR 1 DOSE   . diphenoxylate-atropine (LOMOTIL) 2.5-0.025 MG tablet Take 1  tablet by mouth 4 (four) times daily as needed for diarrhea or loose stools.   . ferrous sulfate 325 (65 FE) MG tablet Take 1 tablet (325 mg total) by mouth daily with breakfast. 07/29/2014: Patient stated that she takes three times a week, however, she does not take it on certain days of the week. She takes it when she remembers to take it.  . gabapentin (NEURONTIN) 100 MG capsule Take 2 capsules (200 mg total) by mouth at bedtime.   . hydrochlorothiazide (HYDRODIURIL) 25 MG tablet TAKE 1 TABLET(25 MG) BY MOUTH EVERY MORNING   . lisinopril (ZESTRIL) 40 MG tablet TAKE 1 TABLET(40 MG) BY MOUTH DAILY   . Magnesium 100 MG CAPS Take 100 mg by mouth daily.   . potassium chloride (KLOR-CON) 8 MEQ tablet TAKE 2 TABLETS(16 MEQ) BY MOUTH DAILY   . tiZANidine (ZANAFLEX) 2 MG tablet Take 1 tablet (2 mg total) by mouth at bedtime.   . TURMERIC PO Take 1 tablet by mouth daily.    No facility-administered encounter medications on file as of 06/18/2020.    Have you seen any other providers since your last visit?  Patient states she has seen her Eye doctor and is having a procedure done this Friday to have a contact Band-Aid remove.  Any changes in your medications or health? Patient stated she has some new eye drops to use for a short period of time.  Any side effects from any medications?  Patient states no side effects.  Do you have an symptoms or problems not managed by your medications?  Patient states no problems at this time.  Any concerns about your health right now?  Patient states no concerns at this time.  Has your provider asked that you check blood pressure, blood sugar, or follow special diet at home?  Patient states she do not check her BP or glucose. She does tries to eat healthy after going to a diabetic education class and learning what not to eat.  Do you get any type of exercise on a regular basis?  Patient states she enjoys going to the [facility] for Water aerobics until recently when  she developed a bacteria infection and had eye problems.  Can you think of a goal you would like to reach for your health?  Patient states she would like to lose weight and get back to a size 14 in clothes.  Do you have any problems getting your medications?  Patient states she doesn't have problems getting her medications because most of them are free with her insurance. Patient states that sometimes she have a co-pay of things and have to wait until the 1st of the month when she has income.  Is there anything that you would like to discuss during the appointment?  Patient is interested in learning more about [facility] and delivery because there's always a wait at [facility].  Please bring medications and supplements to appointment  Star Rating Drugs: Lisinopril - last fill 06/05/20 90D Atorvastatin - last fill 03/12/20 90D (Takes every other day & still have some on hand)  Orinda Kenner, RMA Clinical Pharmacists Assistant [phone]  Time Spent: [phone]

## 2020-06-19 ENCOUNTER — Other Ambulatory Visit: Payer: Self-pay | Admitting: Family Medicine

## 2020-06-20 ENCOUNTER — Ambulatory Visit: Payer: Medicare Other

## 2020-06-20 NOTE — Telephone Encounter (Signed)
Rescheduled initial appt for 07/03/20 @ 9am.

## 2020-06-20 NOTE — Progress Notes (Deleted)
Chronic Care Management Pharmacy Note  06/20/2020 Name:  Kristina Ayala MRN:  [id number] DOB:  1945-07-15  Subjective: Kristina Ayala is an 75 y.o. year old female who is a primary patient of Burns, Claudina Lick, MD.  The CCM team was consulted for assistance with disease management and care coordination needs.    Engaged with patient face to face for initial visit in response to provider referral for pharmacy case management and/or care coordination services.   Consent to Services:  The patient was given the following information about Chronic Care Management services today, agreed to services, and gave verbal consent: 1. CCM service includes personalized support from designated clinical staff supervised by the primary care provider, including individualized plan of care and coordination with other care providers 2. 24/7 contact phone numbers for assistance for urgent and routine care needs. 3. Service will only be billed when office clinical staff spend 20 minutes or more in a month to coordinate care. 4. Only one practitioner may furnish and bill the service in a calendar month. 5.The patient may stop CCM services at any time (effective at the end of the month) by phone call to the office staff. 6. The patient will be responsible for cost sharing (co-pay) of up to 20% of the service fee (after annual deductible is met). Patient agreed to services and consent obtained.  Patient Care Team: [name], MD as PCP - General (Internal Medicine) Buford Dresser, MD as PCP - Cardiology (Cardiology) Lyndal Pulley, DO as Attending Physician (Family Medicine) Opthamology, [facility] as Consulting Physician (Ophthalmology) Charlton Haws, [facility] as Pharmacist (Pharmacist)  Recent office visits: 06/03/20 Burns (PCP) -  Bronchitis. Start Cephalexin 500 mg.  05/23/20 Burns (PCP) - Snoring. Referral to Neurology for sleep study.   03/03/20 Burns (PCP) - Hypertension. Decrease  gabapentin to 200 mg. D/c cefdinir, meloxicam & tramadol. Referral to Diabetic Ed. F/u 6 mos.  02/12/20 Burns (PCP) - Video Visit. Bronchitis. Start Cefdinir 300 mg  Recent consult visits: 05/29/20 Maudie Mercury [facility] Medicine) - Nasal congestion. Start Benzonatate 100 mg.   05/29/20 Tamala Julian (Sports Medicine) - Lumbar radiculopathy.   03/13/20 Georgina Snell (Sports Medicine) - f/u left knee pain. Triamcinolone Acetonide Injection given.  02/28/20 Smith (Sports Medicine) - Chronic pain. Start using cane. Increase gabapentin to 400 mg, tizanidine to 2 mg. D/c Vit D. F/u 1 mo.  12/28/19 [facility] (GYN) - [facility] visits: {Hospital DC Yes/No:25215}   Objective:  Lab Results  Component Value Date   CREATININE 0.84 03/03/2020   BUN 17 03/03/2020   GFR 67.71 03/03/2020   GFRNONAA >60 04/18/2017   GFRAA >60 04/18/2017   NA 141 03/03/2020   K 3.7 03/03/2020   CALCIUM 10.2 03/03/2020   CO2 32 03/03/2020   GLUCOSE 75 03/03/2020    Lab Results  Component Value Date/Time   HGBA1C 6.2 03/03/2020 02:30 PM   HGBA1C 6.8 (H) 07/11/2019 10:02 AM   GFR 67.71 03/03/2020 02:30 PM   GFR 63.11 11/08/2019 12:25 PM    Last diabetic Eye exam:  Lab Results  Component Value Date/Time   HMDIABEYEEXA No Retinopathy 03/03/2020 12:00 AM    Last diabetic Foot exam: No results found for: HMDIABFOOTEX   Lab Results  Component Value Date   CHOL 165 03/03/2020   HDL 48.90 03/03/2020   LDLCALC 83 03/03/2020   LDLDIRECT 213.1 05/22/2012   TRIG 169.0 (H) 03/03/2020   CHOLHDL 3 03/03/2020    Hepatic Function Latest Ref Rng &  Units 03/03/2020 11/08/2019 07/11/2019  Total Protein 6.0 - 8.3 g/dL 7.3 7.6 6.4  Albumin 3.5 - 5.2 g/dL 4.4 4.4 4.2  AST 0 - 37 U/L '24 19 19  ' ALT 0 - 35 U/L 32 25 31  Alk Phosphatase 39 - 117 U/L 61 42 45  Total Bilirubin 0.2 - 1.2 mg/dL 0.4 0.6 0.4  Bilirubin, Direct 0.0 - 0.3 mg/dL - - -    Lab Results  Component Value Date/Time   TSH 0.65 10/28/2017 02:52 PM    TSH 0.41 12/01/2016 02:51 PM   FREET4 0.89 05/22/2012 03:36 PM    CBC Latest Ref Rng & Units 03/03/2020 11/08/2019 07/11/2019  WBC 4.0 - 10.5 K/uL 8.3 8.3 7.9  Hemoglobin 12.0 - 15.0 g/dL 12.9 13.4 12.7  Hematocrit 36.0 - 46.0 % 39.3 41.2 38.7  Platelets 150.0 - 400.0 K/uL 315.0 313.0 258.0    Lab Results  Component Value Date/Time   VD25OH 111.87 (HH) 07/11/2019 10:02 AM   VD25OH 46.98 07/07/2018 11:20 AM    Clinical ASCVD: {YES/NO:21197} The 10-year ASCVD risk score Mikey Bussing DC Jr., et al., 2013) is: 21.8%   Values used to calculate the score:     Age: 2 years     Sex: Female     Is Non-Hispanic African American: Yes     Diabetic: Yes     Tobacco smoker: No     Systolic Blood Pressure: 308 mmHg     Is BP treated: Yes     HDL Cholesterol: 48.9 mg/dL     Total Cholesterol: 165 mg/dL    Depression screen [facility] 2/9 04/10/2020 01/11/2020 01/10/2019  Decreased Interest 0 0 0  Down, Depressed, Hopeless 0 0 0  PHQ - 2 Score 0 0 0  Altered sleeping - - -  Tired, decreased energy - - -  Change in appetite - - -  Feeling bad or failure about yourself  - - -  Trouble concentrating - - -  Moving slowly or fidgety/restless - - -  Suicidal thoughts - - -  PHQ-9 Score - - -  Difficult doing work/chores - - -  Some recent data might be hidden      Social History   Tobacco Use  Smoking Status Former Smoker  . Packs/day: 0.25  . Years: 50.00  . Pack years: 12.50  . Types: Cigarettes  . Quit date: 08/28/2014  . Years since quitting: 5.8  Smokeless Tobacco Never Used  Tobacco Comment   1 pack every 3 days   BP Readings from Last 3 Encounters:  06/03/20 104/60  05/23/20 134/84  04/18/20 133/74   Pulse Readings from Last 3 Encounters:  06/03/20 97  05/23/20 89  04/18/20 84   Wt Readings from Last 3 Encounters:  06/03/20 169 lb 3.2 oz (76.7 kg)  05/23/20 168 lb 9.6 oz (76.5 kg)  03/13/20 166 lb 3.2 oz (75.4 kg)   BMI Readings from Last 3 Encounters:  06/03/20 29.04 kg/m   05/23/20 28.94 kg/m  03/13/20 28.53 kg/m    Assessment/Interventions: Review of patient past medical history, allergies, medications, health status, including review of consultants reports, laboratory and other test data, was performed as part of comprehensive evaluation and provision of chronic care management services.   SDOH:  (Social Determinants of Health) assessments and interventions performed: Yes  SDOH Screenings   Alcohol Screen: Low Risk   . Last Alcohol Screening Score (AUDIT): 0  Depression (PHQ2-9): Low Risk   . PHQ-2 Score: 0  Financial Resource  Ayala: Low Risk   . Difficulty of Paying Living Expenses: Not hard at all  Food Insecurity: No Food Insecurity  . Worried About Charity fundraiser in the Last Year: Never true  . Ran Out of Food in the Last Year: Never true  Housing: Low Risk   . Last Housing Risk Score: 0  Physical Activity: Sufficiently Active  . Days of Exercise per Week: 5 days  . Minutes of Exercise per Session: 30 min  Social Connections: Moderately Integrated  . Frequency of Communication with Friends and Family: More than three times a week  . Frequency of Social Gatherings with Friends and Family: More than three times a week  . Attends Religious Services: More than 4 times per year  . Active Member of Clubs or Organizations: Yes  . Attends Archivist Meetings: More than 4 times per year  . Marital Status: Widowed  Stress: No Stress Concern Present  . Feeling of Stress : Not at all  Tobacco Use: Medium Risk  . Smoking Tobacco Use: Former Smoker  . Smokeless Tobacco Use: Never Used  Transportation Needs: No Transportation Needs  . Lack of Transportation (Medical): No  . Lack of Transportation (Non-Medical): No    CCM Care Plan  Allergies  Allergen Reactions  . Doxycycline Diarrhea  . Minocycline Diarrhea  . Wellbutrin [Bupropion] Swelling    Per pt her tongue was swollen and sore/symptoms stopped once the medication was  discontinued.   [name] Penicillins Rash    Has patient had a PCN reaction causing immediate rash, facial/tongue/throat swelling, SOB or lightheadedness with hypotension: yes Has patient had a PCN reaction causing severe rash involving mucus membranes or skin necrosis: no Has patient had a PCN reaction that required hospitalization: unknown Has patient had a PCN reaction occurring within the last 10 years: no If all of the above answers are "NO", then may proceed with Cephalosporin use.   [name] Z-Pak [Azithromycin] Other (See Comments)    diarrhea    Medications Reviewed Today    Reviewed by [name], MD (Physician) on 06/03/20 at [place] List Status: <None>  Medication Order Taking? Sig Documenting Provider Last Dose Status Informant  ALPRAZolam (XANAX) 0.5 MG tablet [id number] Yes TAKE 1 TABLET(0.5 MG) BY MOUTH AT BEDTIME AS NEEDED FOR ANXIETY Burns, Claudina Lick, MD Taking Active   amLODipine (NORVASC) 5 MG tablet [id number] Yes Take 1.5 tablets (7.5 mg total) by mouth daily. [name], MD Taking Active   aspirin EC 81 MG tablet [id number] Yes Take 81 mg by mouth daily. [provider] Taking Active Self  atorvastatin (LIPITOR) 40 MG tablet [id number] Yes Take 1 tablet (40 mg total) by mouth every other day. [name], MD Taking Active   benzonatate (TESSALON PERLES) 100 MG capsule [id number] Yes Take 1 capsule (100 mg total) by mouth 3 (three) times daily as needed. Lucretia Kern, DO Taking Active   Cholecalciferol (VITAMIN D3) 10000 units TABS [id number] Yes Take 1 tablet by mouth once a week.  [provider] Taking Active Self  Cyanocobalamin (VITAMIN B-12) 2500 MCG SUBL [id number] Yes Place 1 tablet under the tongue daily. [provider] Taking Active Self  denosumab (PROLIA) 60 MG/ML SOSY injection [id number] Yes INJECT 60 MG INTO THE SKIN ONCE FOR 1 DOSE Burns, Claudina Lick, MD Taking Active   diphenoxylate-atropine (LOMOTIL) 2.5-0.025 MG tablet [id number] Yes Take 1  tablet by mouth 4 (four) times daily as needed for diarrhea or  loose stools. [name], MD Taking Active   ferrous sulfate 325 (65 FE) MG tablet [id number] Yes Take 1 tablet (325 mg total) by mouth daily with breakfast. Lyndal Pulley, DO Taking Active Self           Med Note Lenard Lance Jul 29, 2014  8:39 AM) Patient stated that she takes three times a week, however, she does not take it on certain days of the week. She takes it when she remembers to take it.  gabapentin (NEURONTIN) 100 MG capsule [id number] Yes Take 2 capsules (200 mg total) by mouth at bedtime. Lyndal Pulley, DO Taking Active   hydrochlorothiazide (HYDRODIURIL) 25 MG tablet [id number] Yes TAKE 1 TABLET(25 MG) BY MOUTH EVERY MORNING Burns, Claudina Lick, MD Taking Active   lisinopril (ZESTRIL) 40 MG tablet [id number] Yes TAKE 1 TABLET(40 MG) BY MOUTH DAILY [name], MD Taking Active   Magnesium 100 MG CAPS [id number] Yes Take 100 mg by mouth daily. [provider] Taking Active Self  potassium chloride (KLOR-CON) 8 MEQ tablet [id number] Yes TAKE 2 TABLETS(16 MEQ) BY MOUTH DAILY Burns, Claudina Lick, MD Taking Active   tiZANidine (ZANAFLEX) 2 MG tablet [id number] Yes Take 1 tablet (2 mg total) by mouth at bedtime. Lyndal Pulley, DO Taking Active   TURMERIC PO [id number] Yes Take 1 tablet by mouth daily. [provider] Taking Active Self          Patient Active Problem List   Diagnosis Date Noted  . Snoring 05/23/2020  . Cough 12/04/2019  . Pancreatic mass 11/08/2019  . DOE (dyspnea on exertion) 03/20/2019  . Chronic left SI joint pain 03/13/2019  . Leg cramping 01/10/2019  . Osteoarthritis 01/10/2019  . Right lower quadrant abdominal pain 11/09/2018  . Adnexal mass, right 11/09/2018  . Change in stool 10/18/2018  . Lung nodules 06/19/2018  . Abnormal findings on diagnostic imaging of lung 06/08/2018  . Thyromegaly 10/28/2017  . Restless leg syndrome 10/12/2017  . Greater trochanteric  bursitis of left hip 07/26/2017  . Hypokalemia 04/18/2017  . Acute bronchitis 12/28/2016  . Degenerative arthritis of left knee 12/01/2016  . Anxiety 12/01/2016  . Diabetes mellitus without complication ([place]) 51/70/0174  . Bursitis of right shoulder 09/04/2015  . Lateral meniscus derangement 06/03/2015  . Tear of LCL (lateral collateral ligament) of knee 06/03/2015  . Lumbar radiculopathy 09/19/2014  . Low back pain 01/04/2014  . Bilateral knee pain 01/04/2014  . Diverticulosis of colon without hemorrhage 07/26/2013  . Trigger thumb of left hand 03/06/2013  . Essential hypertension, benign 02/15/2013  . Hyperlipidemia 09/28/2012  . COPD (chronic obstructive pulmonary disease) ([place]) 05/22/2012  . Osteoporosis 05/22/2012    Immunization History  Administered Date(s) Administered  . Fluad Quad(high Dose 65+) 10/18/2018  . Influenza, High Dose Seasonal PF 11/10/2016, 10/28/2017, 12/28/2019  . Influenza,inj,Quad PF,6+ Mos 11/12/2013, 11/27/2014  . Influenza-Unspecified 11/26/2015  . PFIZER(Purple Top)SARS-COV-2 Vaccination 03/19/2019, 04/09/2019  . Pneumococcal Conjugate-13 04/30/2015  . Pneumococcal Polysaccharide-23 01/04/2014  . Tdap 07/29/2014    Conditions to be addressed/monitored:  {USCCMDZASSESSMENTOPTIONS:23563}  There are no care plans that you recently modified to display for this patient.    Medication Assistance: {MEDASSISTANCEINFO:25044}  Compliance/Adherence/Medication fill history: Care Gaps: Foot exam  Star-Rating Drugs: Atorvastatin - LF 03/12/20 90 ds (overdue) Lisinopril - LF 06/05/20 90 ds  Patient's preferred pharmacy is:  [facility] [place], [address]  [place] [address] Phone: [phone] Fax: [phone]  Uses pill box? {Yes or If no, why not?:20788} Pt endorses ***% compliance  We discussed: {Pharmacy options:24294} Patient decided to: {US  Pharmacy Plan:23885}  Care Plan and Follow Up Patient Decision:  {FOLLOWUP:24991}  Plan: {CM FOLLOW UP HQRF:75883}  ***    Current Barriers:  . {pharmacybarriers:24917}  Pharmacist Clinical Goal(s):  [name] Patient will {PHARMACYGOALCHOICES:24921} through collaboration with PharmD and provider.   Interventions: . 1:1 collaboration with [name], MD regarding development and update of comprehensive plan of care as evidenced by provider attestation and co-signature . Inter-disciplinary care team collaboration (see longitudinal plan of care) . Comprehensive medication review performed; medication list updated in electronic medical record  Hypertension (BP goal <130/80) -{US controlled/uncontrolled:25276} -Current treatment: . Amlodipine 5 mg - 1.5 tab daily . HCTZ 25 mg daily . Lisinopril 40 mg daily -Medications previously tried: ***  -Current home readings: *** -Current dietary habits: *** -Current exercise habits: *** -{ACTIONS;DENIES/REPORTS:21021675::"Denies"} hypotensive/hypertensive symptoms -Educated on {CCM BP Counseling:25124} -Counseled to monitor BP at home ***, document, and provide log at future appointments -{CCMPHARMDINTERVENTION:25122}  Hyperlipidemia: (LDL goal < 100) -{US controlled/uncontrolled:25276} -Current treatment: . Atorvastatin 40 mg every other day . Aspirin 81 mg daily -Medications previously tried: ***  -Current dietary patterns: *** -Current exercise habits: *** -Educated on {CCM HLD Counseling:25126} -{CCMPHARMDINTERVENTION:25122}  Diabetes (A1c goal <7%) -Diet controlled -Current home glucose readings . fasting glucose: *** . post prandial glucose: *** -{ACTIONS;DENIES/REPORTS:21021675::"Denies"} hypoglycemic/hyperglycemic symptoms -Current meal patterns:  . breakfast: ***  . lunch: ***  . dinner: *** . snacks: *** . drinks: *** -Current exercise: *** -Educated on {CCM DM COUNSELING:25123} -Counseled to check feet daily and  get yearly eye exams -{CCMPHARMDINTERVENTION:25122}  Depression/Anxiety (Goal: ***) -{US controlled/uncontrolled:25276} -Current treatment: . Alprazolam 0.5 mg HS prn -Medications previously tried/failed: *** -PHQ9: 0 (03/2020) -GAD7: not on file -Connected with PCP for mental health support -Educated on {CCM mental health counseling:25127} -{CCMPHARMDINTERVENTION:25122}  Osteoporosis (Goal : prevent fractures) -{US controlled/uncontrolled:25276} -Last DEXA Scan: 07/07/2018  T-Score femoral neck: -2.3  T-Score lumbar spine: -2.3  10-year probability of major osteoporotic fracture: 10.5%  10-year probability of hip fracture: 2.7% -Patient {is;is not an osteoporosis candidate:23886} -Current treatment  . Prolia 60 mg inj q 6 months (last 07/27/2019). Started 2018 . Vitamin D 10,000 IU once a week -Medications previously tried: ***  -{Osteoporosis Counseling:23892} -{CCMPHARMDINTERVENTION:25122}  Pain (Goal: manage symptoms) -{US controlled/uncontrolled:25276} -Current treatment  . Gabapentin 100 mg - 2 cap HS . Tizanidine 2 mg HS -Medications previously tried: ***  -{CCMPHARMDINTERVENTION:25122}  Health Maintenance -Vaccine gaps: covid booster, shingrix -Current therapy:  . Lomotil PRN . Vitamin B12 2500 mcg daily . Ferrous sulfate 325 mg daily . Turmeric . Magnesium 100 mg daily . Klor-Con 8 mEq - 2 tab daily . Benzonatate 100 mg TID prn (06/16/20 - ) -Educated on {ccm supplement counseling:25128} -{CCM Patient satisfied:25129} -{CCMPHARMDINTERVENTION:25122}   Patient Goals/Self-Care Activities . Patient will:  - {pharmacypatientgoals:24919}  Follow Up Plan: {CM FOLLOW UP GPQD:82641}

## 2020-06-24 NOTE — Progress Notes (Signed)
    Chronic Care Management Pharmacy Assistant   Name: Kristina Ayala  MRN: [id number] DOB: 1945/01/27   Reason for Encounter: Chart Review    Medications: Outpatient Encounter Medications as of 06/18/2020  Medication Sig Note  . ALPRAZolam (XANAX) 0.5 MG tablet TAKE 1 TABLET(0.5 MG) BY MOUTH AT BEDTIME AS NEEDED FOR ANXIETY   . amLODipine (NORVASC) 5 MG tablet Take 1.5 tablets (7.5 mg total) by mouth daily.   [name] aspirin EC 81 MG tablet Take 81 mg by mouth daily.   [name] atorvastatin (LIPITOR) 40 MG tablet Take 1 tablet (40 mg total) by mouth every other day.   . benzonatate (TESSALON PERLES) 100 MG capsule Take 1 capsule (100 mg total) by mouth 3 (three) times daily as needed.   . Cholecalciferol (VITAMIN D3) 10000 units TABS Take 1 tablet by mouth once a week.    . Cyanocobalamin (VITAMIN B-12) 2500 MCG SUBL Place 1 tablet under the tongue daily.   [name] denosumab (PROLIA) 60 MG/ML SOSY injection INJECT 60 MG INTO THE SKIN ONCE FOR 1 DOSE   . diphenoxylate-atropine (LOMOTIL) 2.5-0.025 MG tablet Take 1 tablet by mouth 4 (four) times daily as needed for diarrhea or loose stools.   . ferrous sulfate 325 (65 FE) MG tablet Take 1 tablet (325 mg total) by mouth daily with breakfast. 07/29/2014: Patient stated that she takes three times a week, however, she does not take it on certain days of the week. She takes it when she remembers to take it.  . gabapentin (NEURONTIN) 100 MG capsule Take 2 capsules (200 mg total) by mouth at bedtime.   . hydrochlorothiazide (HYDRODIURIL) 25 MG tablet TAKE 1 TABLET(25 MG) BY MOUTH EVERY MORNING   . lisinopril (ZESTRIL) 40 MG tablet TAKE 1 TABLET(40 MG) BY MOUTH DAILY   . Magnesium 100 MG CAPS Take 100 mg by mouth daily.   . potassium chloride (KLOR-CON) 8 MEQ tablet TAKE 2 TABLETS(16 MEQ) BY MOUTH DAILY   . tiZANidine (ZANAFLEX) 2 MG tablet Take 1 tablet (2 mg total) by mouth at bedtime.   . TURMERIC PO Take 1 tablet by mouth daily.    No facility-administered  encounter medications on file as of 06/18/2020.    Pharmacist Review  Reviewed chart for medication changes and adherence.  No OVs, Consults, or hospital visits since last care coordination call / Pharmacist visit. No medication changes indicated  No gaps in adherence identified. Patient has follow up scheduled with pharmacy team. No further action required.  [facility] Pharmacist Assistant [phone]  Time spent:9

## 2020-07-01 ENCOUNTER — Telehealth: Payer: Self-pay | Admitting: Pharmacist

## 2020-07-01 DIAGNOSIS — Z20822 Contact with and (suspected) exposure to covid-19: Secondary | ICD-10-CM | POA: Diagnosis not present

## 2020-07-01 NOTE — Progress Notes (Signed)
error 

## 2020-07-03 ENCOUNTER — Other Ambulatory Visit: Payer: Self-pay | Admitting: Internal Medicine

## 2020-07-03 ENCOUNTER — Other Ambulatory Visit: Payer: Self-pay

## 2020-07-03 ENCOUNTER — Ambulatory Visit (INDEPENDENT_AMBULATORY_CARE_PROVIDER_SITE_OTHER): Payer: Medicare Other | Admitting: Pharmacist

## 2020-07-03 DIAGNOSIS — M810 Age-related osteoporosis without current pathological fracture: Secondary | ICD-10-CM | POA: Diagnosis not present

## 2020-07-03 DIAGNOSIS — I10 Essential (primary) hypertension: Secondary | ICD-10-CM

## 2020-07-03 DIAGNOSIS — R7303 Prediabetes: Secondary | ICD-10-CM

## 2020-07-03 DIAGNOSIS — M5416 Radiculopathy, lumbar region: Secondary | ICD-10-CM

## 2020-07-03 DIAGNOSIS — E782 Mixed hyperlipidemia: Secondary | ICD-10-CM | POA: Diagnosis not present

## 2020-07-03 DIAGNOSIS — F419 Anxiety disorder, unspecified: Secondary | ICD-10-CM

## 2020-07-03 MED ORDER — ALPRAZOLAM 0.5 MG PO TABS: ORAL_TABLET | ORAL | 2 refills | Status: DC

## 2020-07-03 MED ORDER — PROLIA 60 MG/ML ~~LOC~~ SOSY: PREFILLED_SYRINGE | SUBCUTANEOUS | 0 refills | Status: DC

## 2020-07-03 NOTE — Progress Notes (Signed)
Chronic Care Management Pharmacy Note  07/03/2020 Name:  Kristina Ayala MRN:  [id number] DOB:  1945/08/17  Summary: -Pt is overdue for Prolia injection (last done 07/2019) -Pt is not checking BP at home -Pt had a mechanical fall 06/29/20 in a restaurant bathroom, she did not hit her head and is feeling fine today.   Recommendations/Changes made from today's visit: -Get a BP cuff and monitor at home several times a week. Depending on home BP, may change amlodipine to 5 or 10 mg so pt doesn't have to use pill cutter anymore -Advised to get Covid booster -Switching to [facility] for pill packs/delivery -Pt needs refill for Alprazolam to [facility]   Subjective: Kristina Ayala is an 75 y.o. year old female who is a primary patient of Burns, Claudina Lick, MD.  The CCM team was consulted for assistance with disease management and care coordination needs.    Engaged with patient face to face for initial visit in response to provider referral for pharmacy case management and/or care coordination services.   Consent to Services:  The patient was given the following information about Chronic Care Management services today, agreed to services, and gave verbal consent: 1. CCM service includes personalized support from designated clinical staff supervised by the primary care provider, including individualized plan of care and coordination with other care providers 2. 24/7 contact phone numbers for assistance for urgent and routine care needs. 3. Service will only be billed when office clinical staff spend 20 minutes or more in a month to coordinate care. 4. Only one practitioner may furnish and bill the service in a calendar month. 5.The patient may stop CCM services at any time (effective at the end of the month) by phone call to the office staff. 6. The patient will be responsible for cost sharing (co-pay) of up to 20% of the service fee (after annual deductible is met). Patient agreed to services and  consent obtained.  Patient Care Team: [name], MD as PCP - General (Internal Medicine) Buford Dresser, MD as PCP - Cardiology (Cardiology) Lyndal Pulley, DO as Attending Physician (Family Medicine) Opthamology, [facility] as Consulting Physician (Ophthalmology) Charlton Haws, [facility] as Pharmacist (Pharmacist)  Recent office visits: 06/03/20 Burns (PCP) -  Bronchitis. Start Cephalexin 500 mg.   05/23/20 Burns (PCP) - Snoring. Referral to Neurology for sleep study.    03/03/20 Burns (PCP) - Hypertension. Decrease gabapentin to 200 mg. D/c cefdinir, meloxicam & tramadol. Referral to Diabetic Ed. F/u 6 mos.   02/12/20 Burns (PCP) - Video Visit. Bronchitis. Start Cefdinir 300 mg  Recent consult visits: 05/29/20 Maudie Mercury [facility] Medicine) - Nasal congestion. Start Benzonatate 100 mg.    05/29/20 Tamala Julian (Sports Medicine) - Lumbar radiculopathy.   03/13/20 Georgina Snell (Sports Medicine) - f/u left knee pain. Triamcinolone Acetonide Injection given.   02/28/20 Smith (Sports Medicine) - Chronic pain. Start using cane. Increase gabapentin to 400 mg, tizanidine to 2 mg. D/c Vit D. F/u 1 mo.   12/28/19 Henley (GYN) - [facility] visits: None in previous 6 months   Objective:  Lab Results  Component Value Date   CREATININE 0.84 03/03/2020   BUN 17 03/03/2020   GFR 67.71 03/03/2020   GFRNONAA >60 04/18/2017   GFRAA >60 04/18/2017   NA 141 03/03/2020   K 3.7 03/03/2020   CALCIUM 10.2 03/03/2020   CO2 32 03/03/2020   GLUCOSE 75 03/03/2020    Lab Results  Component Value Date/Time   HGBA1C 6.2 03/03/2020  02:30 PM   HGBA1C 6.8 (H) 07/11/2019 10:02 AM   GFR 67.71 03/03/2020 02:30 PM   GFR 63.11 11/08/2019 12:25 PM    Last diabetic Eye exam:  Lab Results  Component Value Date/Time   HMDIABEYEEXA No Retinopathy 03/03/2020 12:00 AM    Last diabetic Foot exam: No results found for: HMDIABFOOTEX   Lab Results  Component Value Date   CHOL 165 03/03/2020    HDL 48.90 03/03/2020   LDLCALC 83 03/03/2020   LDLDIRECT 213.1 05/22/2012   TRIG 169.0 (H) 03/03/2020   CHOLHDL 3 03/03/2020    Hepatic Function Latest Ref Rng & Units 03/03/2020 11/08/2019 07/11/2019  Total Protein 6.0 - 8.3 g/dL 7.3 7.6 6.4  Albumin 3.5 - 5.2 g/dL 4.4 4.4 4.2  AST 0 - 37 U/L '24 19 19  ' ALT 0 - 35 U/L 32 25 31  Alk Phosphatase 39 - 117 U/L 61 42 45  Total Bilirubin 0.2 - 1.2 mg/dL 0.4 0.6 0.4  Bilirubin, Direct 0.0 - 0.3 mg/dL - - -    Lab Results  Component Value Date/Time   TSH 0.65 10/28/2017 02:52 PM   TSH 0.41 12/01/2016 02:51 PM   FREET4 0.89 05/22/2012 03:36 PM    CBC Latest Ref Rng & Units 03/03/2020 11/08/2019 07/11/2019  WBC 4.0 - 10.5 K/uL 8.3 8.3 7.9  Hemoglobin 12.0 - 15.0 g/dL 12.9 13.4 12.7  Hematocrit 36.0 - 46.0 % 39.3 41.2 38.7  Platelets 150.0 - 400.0 K/uL 315.0 313.0 258.0    Lab Results  Component Value Date/Time   VD25OH 111.87 (HH) 07/11/2019 10:02 AM   VD25OH 46.98 07/07/2018 11:20 AM    Clinical ASCVD: No  The 10-year ASCVD risk score Mikey Bussing DC Jr., et al., 2013) is: 21.8%   Values used to calculate the score:     Age: 75 years     Sex: Female     Is Non-Hispanic African American: Yes     Diabetic: Yes     Tobacco smoker: No     Systolic Blood Pressure: 035 mmHg     Is BP treated: Yes     HDL Cholesterol: 48.9 mg/dL     Total Cholesterol: 165 mg/dL    Depression screen [facility] 2/9 04/10/2020 01/11/2020 01/10/2019  Decreased Interest 0 0 0  Down, Depressed, Hopeless 0 0 0  PHQ - 2 Score 0 0 0  Altered sleeping - - -  Tired, decreased energy - - -  Change in appetite - - -  Feeling bad or failure about yourself  - - -  Trouble concentrating - - -  Moving slowly or fidgety/restless - - -  Suicidal thoughts - - -  PHQ-9 Score - - -  Difficult doing work/chores - - -  Some recent data might be hidden      Social History   Tobacco Use  Smoking Status Former   Packs/day: 0.25   Years: 50.00   Pack years: 12.50    Types: Cigarettes   Quit date: 08/28/2014   Years since quitting: 5.8  Smokeless Tobacco Never  Tobacco Comments   1 pack every 3 days   BP Readings from Last 3 Encounters:  06/03/20 104/60  05/23/20 134/84  04/18/20 133/74   Pulse Readings from Last 3 Encounters:  06/03/20 97  05/23/20 89  04/18/20 84   Wt Readings from Last 3 Encounters:  06/03/20 169 lb 3.2 oz (76.7 kg)  05/23/20 168 lb 9.6 oz (76.5 kg)  03/13/20 166 lb 3.2 oz (  75.4 kg)   BMI Readings from Last 3 Encounters:  06/03/20 29.04 kg/m  05/23/20 28.94 kg/m  03/13/20 28.53 kg/m    Assessment/Interventions: Review of patient past medical history, allergies, medications, health status, including review of consultants reports, laboratory and other test data, was performed as part of comprehensive evaluation and provision of chronic care management services.   SDOH:  (Social Determinants of Health) assessments and interventions performed: Yes  SDOH Screenings   Alcohol Screen: Low Risk    Last Alcohol Screening Score (AUDIT): 0  Depression (PHQ2-9): Low Risk    PHQ-2 Score: 0  Financial Resource Strain: Low Risk    Difficulty of Paying Living Expenses: Not hard at all  Food Insecurity: No Food Insecurity   Worried About Charity fundraiser in the Last Year: Never true   Ran Out of Food in the Last Year: Never true  Housing: Low Risk    Last Housing Risk Score: 0  Physical Activity: Sufficiently Active   Days of Exercise per Week: 5 days   Minutes of Exercise per Session: 30 min  Social Connections: Moderately Integrated   Frequency of Communication with Friends and Family: More than three times a week   Frequency of Social Gatherings with Friends and Family: More than three times a week   Attends Religious Services: More than 4 times per year   Active Member of [employer] or Organizations: Yes   Attends Archivist Meetings: More than 4 times per year   Marital Status: Widowed  Stress: No Stress  Concern Present   Feeling of Stress : Not at all  Tobacco Use: Medium Risk   Smoking Tobacco Use: Former   Smokeless Tobacco Use: Never  Transportation Needs: No Data processing manager (Medical): No   Lack of Transportation (Non-Medical): No    CCM Care Plan  Allergies  Allergen Reactions   Doxycycline Diarrhea   Minocycline Diarrhea   Wellbutrin [Bupropion] Swelling    Per pt her tongue was swollen and sore/symptoms stopped once the medication was discontinued.    Penicillins Rash    Has patient had a PCN reaction causing immediate rash, facial/tongue/throat swelling, SOB or lightheadedness with hypotension: yes Has patient had a PCN reaction causing severe rash involving mucus membranes or skin necrosis: no Has patient had a PCN reaction that required hospitalization: unknown Has patient had a PCN reaction occurring within the last 10 years: no If all of the above answers are "NO", then may proceed with Cephalosporin use.    Z-Pak [Azithromycin] Other (See Comments)    diarrhea    Medications Reviewed Today     Reviewed by Charlton Haws, [facility] (Pharmacist) on 07/03/20 at 1001  Med List Status: <None>   Medication Order Taking? Sig Documenting Provider Last Dose Status Informant  acetaminophen (TYLENOL) 500 MG tablet [id number] Yes Take 500 mg by mouth every 6 (six) hours as needed. [provider] Taking Active   ALPRAZolam Duanne Moron) 0.5 MG tablet [id number] Yes TAKE 1 TABLET(0.5 MG) BY MOUTH AT BEDTIME AS NEEDED FOR ANXIETY Burns, Claudina Lick, MD Taking Active   amLODipine (NORVASC) 5 MG tablet [id number] Yes Take 1.5 tablets (7.5 mg total) by mouth daily. [name], MD Taking Active   atorvastatin (LIPITOR) 40 MG tablet [id number] Yes Take 1 tablet (40 mg total) by mouth every other day. [name], MD Taking Active   cholecalciferol (VITAMIN D) 25 MCG (1000 UNIT) tablet [id number] Yes Take 1 tablet by  mouth daily. [provider]  Taking Active Self  Cyanocobalamin (VITAMIN B-12) 2500 MCG SUBL [id number] Yes Place 1 tablet under the tongue daily. [provider] Taking Active Self  denosumab (PROLIA) 60 MG/ML SOSY injection [id number] Yes INJECT 60 MG INTO THE SKIN ONCE FOR 1 DOSE Burns, Claudina Lick, MD Taking Active   gabapentin (NEURONTIN) 100 MG capsule [id number] Yes Take 2 capsules (200 mg total) by mouth at bedtime. Lyndal Pulley, DO Taking Active   hydrochlorothiazide (HYDRODIURIL) 25 MG tablet [id number] Yes TAKE 1 TABLET(25 MG) BY MOUTH EVERY MORNING Burns, Claudina Lick, MD Taking Active   ibuprofen (ADVIL) 200 MG tablet [id number] Yes Take 200 mg by mouth every 6 (six) hours as needed. [provider] Taking Active   lisinopril (ZESTRIL) 40 MG tablet [id number] Yes TAKE 1 TABLET(40 MG) BY MOUTH DAILY [name], MD Taking Active   Magnesium 100 MG CAPS [id number] Yes Take 100 mg by mouth daily. [provider] Taking Active Self  potassium chloride (KLOR-CON) 8 MEQ tablet [id number] Yes TAKE 2 TABLETS(16 MEQ) BY MOUTH DAILY Burns, Claudina Lick, MD Taking Active   tiZANidine (ZANAFLEX) 2 MG tablet [id number] Yes Take 1 tablet (2 mg total) by mouth at bedtime. Lyndal Pulley, DO Taking Active   TURMERIC PO [id number] Yes Take 1 tablet by mouth daily. [provider] Taking Active Self            Patient Active Problem List   Diagnosis Date Noted   Snoring 05/23/2020   Cough 12/04/2019   Pancreatic mass 11/08/2019   DOE (dyspnea on exertion) 03/20/2019   Chronic left SI joint pain 03/13/2019   Leg cramping 01/10/2019   Osteoarthritis 01/10/2019   Right lower quadrant abdominal pain 11/09/2018   Adnexal mass, right 11/09/2018   Change in stool 10/18/2018   Lung nodules 06/19/2018   Abnormal findings on diagnostic imaging of lung 06/08/2018   Thyromegaly 10/28/2017   Restless leg syndrome 10/12/2017   Greater trochanteric bursitis of left hip 07/26/2017   Hypokalemia 04/18/2017    Acute bronchitis 12/28/2016   Degenerative arthritis of left knee 12/01/2016   Anxiety 12/01/2016   Diabetes mellitus without complication ([place]) 12/10/5206   Bursitis of right shoulder 09/04/2015   Lateral meniscus derangement 06/03/2015   Tear of LCL (lateral collateral ligament) of knee 06/03/2015   Lumbar radiculopathy 09/19/2014   Low back pain 01/04/2014   Bilateral knee pain 01/04/2014   Diverticulosis of colon without hemorrhage 07/26/2013   Trigger thumb of left hand 03/06/2013   Essential hypertension, benign 02/15/2013   Hyperlipidemia 09/28/2012   COPD (chronic obstructive pulmonary disease) ([place]) 05/22/2012   Osteoporosis 05/22/2012    Immunization History  Administered Date(s) Administered   Fluad Quad(high Dose 65+) 10/18/2018   Influenza, High Dose Seasonal PF 11/10/2016, 10/28/2017, 12/28/2019   Influenza,inj,Quad PF,6+ Mos 11/12/2013, 11/27/2014   Influenza-Unspecified 11/26/2015   PFIZER(Purple Top)SARS-COV-2 Vaccination 03/19/2019, 04/09/2019   Pneumococcal Conjugate-13 04/30/2015   Pneumococcal Polysaccharide-23 01/04/2014   Tdap 07/29/2014    Conditions to be addressed/monitored:  Hypertension, Hyperlipidemia, Anxiety, Osteoporosis, Osteoarthritis, and Prediabetes  Care Plan : [place]  Updates made by Charlton Haws, [place] since 07/03/2020 12:00 AM     Problem: Hypertension, Hyperlipidemia, Anxiety, Osteoporosis, Osteoarthritis, and Prediabetes   Priority: High     Long-Range Goal: Disease management   Start Date: 07/03/2020  Expected End Date: 07/03/2021  This Visit's Progress: On track  Priority: High  Note:   Current Barriers:  Unable to independently monitor therapeutic efficacy Suboptimal pharmacy services  Pharmacist Clinical Goal(s):  Patient will achieve adherence to monitoring guidelines and medication adherence to achieve therapeutic efficacy Optimize pharmacy services through collaboration with PharmD and provider.    Interventions: 1:1 collaboration with [name], MD regarding development and update of comprehensive plan of care as evidenced by provider attestation and co-signature Inter-disciplinary care team collaboration (see longitudinal plan of care) Comprehensive medication review performed; medication list updated in electronic medical record  Hypertension (BP goal <130/80) -Controlled - pt is not checking BP at home; BP in clinic has been at goal; pt reports it is not ideal having to cut amlodipine in half -Current treatment: Amlodipine 5 mg - 1.5 tab daily HCTZ 25 mg daily Lisinopril 40 mg daily -Current home readings: no cuff -Denies hypotensive/hypertensive symptoms -Educated on BP goals and benefits of medications for prevention of heart attack, stroke and kidney damage; Importance of home blood pressure monitoring; Symptoms of hypotension and importance of maintaining adequate hydration;  -Counseled to monitor BP at home every other day, document, and provide log at future appointments -Recommended to continue current medication; can consider changing amlodipine to 5 or 10 mg for whole tablets depending on home BP  Hyperlipidemia: (LDL goal < 100) -Controlled - LDL is at goal; pt is likely noncompliant with QOD dosing of atovastatin based on last fill date; she is no longer taking aspirin -Current treatment: Atorvastatin 40 mg every other day Aspirin 81 mg daily - not taking -Educated on Cholesterol goals; Benefits of statin for ASCVD risk reduction; -Counseled on risk/benefit of aspirin - agreed it is not necessary -Recommended to continue current medication  Diabetes (A1c goal <7%) -Diet controlled -Educated on A1c and blood sugar goals; -Counseled to check feet daily and get yearly eye exams -Counseled on diet and exercise extensively  Depression/Anxiety (Goal: manage symptoms) -Controlled - pt takes alprazolam ~once a week; she reports her daughter is main source of  stress -Current treatment: Alprazolam 0.5 mg HS prn -PHQ9: 0 (03/2020) -GAD7: not on file -Connected with PCP for mental health support -Educated on optimal PRN use of alprazolam  -Recommended to continue current medication  Osteoporosis (Goal : prevent fractures) -Not ideally controlled - pt is overdue for Prolia injection; she reports she did get an injection around Jan 2022, however last documented injection was 07/2019; she reports she normal gets Prolia at the pharmacy and brings it to PCP office for injection -Last DEXA Scan: 07/07/2018  T-Score femoral neck: -2.3  T-Score lumbar spine: -2.3  10-year probability of major osteoporotic fracture: 10.5%  10-year probability of hip fracture: 2.7% -Patient is not a candidate for pharmacologic treatment -Current treatment  Prolia 60 mg inj q 6 months (last 07/27/2019). Started 2018 Vitamin D 5,000 units daily -Recommend [phone] units of vitamin D daily. Recommend 1200 mg of calcium daily from dietary and supplemental sources. -Coordinate scheduling of Prolia injection this month  Pain (Goal: manage symptoms) -Controlled - pt is taking gabapentin PRN and not taking tizanidine due to grogginess side effects; she occasionally takes ibuprofen for musculoskeletal pain -osteoarthritis of hip/knee, lumbar radiculopathy -Current treatment  Gabapentin 100 mg - 2 cap HS PRN Tizanidine 2 mg HS -not taking Ibuprofen 200 mg PRN Tylenol 500 mg PRN -Counseled on risks of chronic ibuprofen use (GI, kidney, BP); advised to use Tylenol for as-needed pain relief  Health Maintenance -Vaccine gaps: covid booster, shingrix -Pt reports she got 2 Shingrix shots around July/Aug 2021 at [facility] -Current  therapy:  Vitamin B12 2500 mcg daily Turmeric Magnesium 100 mg daily Klor-Con 8 mEq - 2 tab daily -Patient is satisfied with current therapy and denies issues -Recommended to continue current medication -Advised to get Covid booster at local  pharmacy  Patient Goals/Self-Care Activities Patient will:  - take medications as prescribed focus on medication adherence by pill box check blood pressure every other day, document, and provide at future appointments -Get a covid booster at local pharmacy -Use Tylenol instead of ibuprofen for as-needed pain relief -Utilize [facility] pharmacy for medication synchronization, packaging and delivery       Medication Assistance: None required.  Patient affirms current coverage meets needs.  Compliance/Adherence/Medication fill history: Care Gaps: Foot exam Shingrix  Covid booster (dose #3) - due 09/09/19  Star-Rating Drugs: Atorvastatin - LF 03/12/20 90 ds (overdue) Lisinopril - LF 06/05/20 90 ds  Patient's preferred pharmacy is:  [id number] [address], [address] [place] [address] Phone: [phone] Fax: [phone]  Uses pill box? Yes Pt endorses 90% compliance  We discussed: Verbal consent obtained for [facility] enhanced pharmacy services (medication synchronization, adherence packaging, delivery coordination). A medication sync plan was created to allow patient to get all medications delivered once every 30 to 90 days per patient preference. Patient understands they have freedom to choose pharmacy and clinical pharmacist will coordinate care between all prescribers and [facility]. Patient decided to: Utilize [facility] pharmacy for medication synchronization, packaging and delivery  Care Plan and Follow Up Patient Decision:  Patient agrees to Care Plan and Follow-up.  Plan: Telephone follow up appointment with care management team member scheduled for:  6 months  Charlene Brooke, PharmD, [place], CPP Clinical Pharmacist [facility] at [facility] [phone]

## 2020-07-03 NOTE — Patient Instructions (Signed)
Visit Information  Phone number for Pharmacist: [phone]  Thank you for meeting with me to discuss your medications! I look forward to working with you to achieve your health care goals. Below is a summary of what we talked about during the visit:   Goals Addressed             This Visit's Progress    Manage My Medicine       Timeframe:  Long-Range Goal Priority:  Medium Start Date:  07/03/20                           Expected End Date:     07/03/21                  Follow Up Date Dec 2022   - call for medicine refill 2 or 3 days before it runs out - call if I am sick and can't take my medicine - keep a list of all the medicines I take; vitamins and herbals too -Utilize [facility] pharmacy for medication synchronization, packaging and delivery -Get a covid booster at local pharmacy -Use Tylenol instead of ibuprofen for as-needed pain relief     Why is this important?   These steps will help you keep on track with your medicines.   Notes:          Ms. Cochrane was given information about Chronic Care Management services today including:  CCM service includes personalized support from designated clinical staff supervised by her physician, including individualized plan of care and coordination with other care providers 24/7 contact phone numbers for assistance for urgent and routine care needs. Standard insurance, coinsurance, copays and deductibles apply for chronic care management only during months in which we provide at least 20 minutes of these services. Most insurances cover these services at 100%, however patients may be responsible for any copay, coinsurance and/or deductible if applicable. This service may help you avoid the need for more expensive face-to-face services. Only one practitioner may furnish and bill the service in a calendar month. The patient may stop CCM services at any time (effective at the end of the month) by phone call to the office  staff.  Patient agreed to services and verbal consent obtained.   The patient verbalized understanding of instructions, educational materials, and care plan provided today and declined offer to receive copy of patient instructions, educational materials, and care plan.  Telephone follow up appointment with pharmacy team member scheduled for: 6 months  Charlene Brooke, PharmD, [place], CPP Clinical Pharmacist [facility] at [facility] [phone]

## 2020-07-07 ENCOUNTER — Telehealth: Payer: Self-pay | Admitting: Family Medicine

## 2020-07-07 ENCOUNTER — Ambulatory Visit (INDEPENDENT_AMBULATORY_CARE_PROVIDER_SITE_OTHER): Payer: Medicare Other

## 2020-07-07 ENCOUNTER — Other Ambulatory Visit: Payer: Self-pay

## 2020-07-07 DIAGNOSIS — Z20822 Contact with and (suspected) exposure to covid-19: Secondary | ICD-10-CM | POA: Diagnosis not present

## 2020-07-07 DIAGNOSIS — M25572 Pain in left ankle and joints of left foot: Secondary | ICD-10-CM | POA: Diagnosis not present

## 2020-07-07 DIAGNOSIS — R0781 Pleurodynia: Secondary | ICD-10-CM

## 2020-07-07 DIAGNOSIS — S2242XA Multiple fractures of ribs, left side, initial encounter for closed fracture: Secondary | ICD-10-CM | POA: Diagnosis not present

## 2020-07-07 NOTE — Telephone Encounter (Signed)
Patient would like to get xrays today as she does not have the money for copay for the visit.

## 2020-07-07 NOTE — Telephone Encounter (Signed)
Patient called and stating that last Sunday (06/29/2020) she twisted wrong and fell on her right side.She has been having ongoing left ankle pain and swelling along with pain under her right breast/bone.  She asked if Dr Tamala Julian would be willing to order an xray for her or what he would suggest.

## 2020-07-07 NOTE — Progress Notes (Unsigned)
Talked to patient about xrays and she states she will come in tomorrow to get an aircast.

## 2020-07-07 NOTE — Telephone Encounter (Signed)
Patient states she would see Dr. Tamala Julian at a later date once she has the funds.

## 2020-07-08 DIAGNOSIS — M25572 Pain in left ankle and joints of left foot: Secondary | ICD-10-CM | POA: Diagnosis not present

## 2020-07-10 ENCOUNTER — Other Ambulatory Visit: Payer: Self-pay

## 2020-07-10 ENCOUNTER — Ambulatory Visit: Payer: Self-pay

## 2020-07-10 ENCOUNTER — Encounter: Payer: Self-pay | Admitting: Family Medicine

## 2020-07-10 ENCOUNTER — Ambulatory Visit: Payer: Medicare Other | Admitting: Family Medicine

## 2020-07-10 VITALS — BP 110/78 | HR 104 | Ht 64.0 in | Wt 168.0 lb

## 2020-07-10 DIAGNOSIS — M25572 Pain in left ankle and joints of left foot: Secondary | ICD-10-CM | POA: Diagnosis not present

## 2020-07-10 DIAGNOSIS — S8262XA Displaced fracture of lateral malleolus of left fibula, initial encounter for closed fracture: Secondary | ICD-10-CM

## 2020-07-10 DIAGNOSIS — S99912A Unspecified injury of left ankle, initial encounter: Secondary | ICD-10-CM | POA: Insufficient documentation

## 2020-07-10 DIAGNOSIS — S2231XA Fracture of one rib, right side, initial encounter for closed fracture: Secondary | ICD-10-CM | POA: Diagnosis not present

## 2020-07-10 DIAGNOSIS — R0781 Pleurodynia: Secondary | ICD-10-CM

## 2020-07-10 DIAGNOSIS — S8263XA Displaced fracture of lateral malleolus of unspecified fibula, initial encounter for closed fracture: Secondary | ICD-10-CM | POA: Insufficient documentation

## 2020-07-10 NOTE — Assessment & Plan Note (Signed)
On ultrasound today discussed with patient at great length.  Discussed that there is likely an avulsion fracture noted but patient did not feel the Aircast was beneficial and if anything hurt more.  Patient is able to ambulate without the aid of walker.  Discussed compression on the ankle and home exercises given to start in 1 week.  Patient will follow up again in 2 to 3 weeks otherwise.  Can consider formal physical therapy but secondary to financial constraints patient declined

## 2020-07-10 NOTE — Assessment & Plan Note (Signed)
Patient does have a rib fracture noted.  Minimally displaced at the T5.  Discussed with patient in great length.  Patient is able to tolerate the pain at the moment.  Discussed the importance of taking deep breaths.  We will get a repeat x-ray in 2 to 3 weeks

## 2020-07-10 NOTE — Patient Instructions (Addendum)
Good to see you  Come back for x ray of ribs in 2-3 weeks Exercises given start in 7 days Take time to take ten deep breaths every hour  See me again as scheduled or if feeling better can be 4-6

## 2020-07-10 NOTE — Assessment & Plan Note (Signed)
Small avulsion fracture noted.  Discussed icing regimen and home exercises, discussed which activities to doing which wants to avoid.  Increase activity slowly.  Patient did not find the Aircast very comfortable.  Patient wants to go with just a compression.  Given some home exercises to start in 1 week.  Follow-up again in 4 weeks

## 2020-07-10 NOTE — Progress Notes (Signed)
[place] Phone: [phone] Subjective:   Kristina Ayala, am serving as a scribe for Dr. Hulan Saas. This visit occurred during the SARS-CoV-2 public health emergency.  Safety protocols were in place, including screening questions prior to the visit, additional usage of staff PPE, and extensive cleaning of exam room while observing appropriate contact time as indicated for disinfecting solutions.   I'm seeing this patient by the request  of:  [name], MD  CC: Injury after fall  MGQ:[id number]  Kristina Ayala is a 75 y.o. female coming in with complaint of fall about a week ago.  Twisted and rolled the right side.  Had pain in her left ankle as well as her right rib cage.  Patient was unable to come in to be seen but wanted x-rays. Pain over L, lateral malleolus, L knee and L hip and R rib with the worst of her pain is the L ankle and ribs. Patient does not like Aircast brace.   Has been doing housework and this is causing an increase in her rib pain. Tries to do deep breathing 10x each hour.    Patient did have x-rays including a rib series on June 13.  Patient was found to have a left fifth rib fracture very minimally displaced.  Independently visualized by me.  Left ankle x-ray showed the patient may have a tiny avulsion fracture noted with soft tissue swelling.   Past Medical History:  Diagnosis Date   Arthritis    Bursitis    left elbow   Clostridium difficile infection    Diabetes mellitus without complication (HCC)    Frequent falls    HLD (hyperlipidemia)    Hypertension    Infectious colitis    Past Surgical History:  Procedure Laterality Date   CATARACT EXTRACTION Bilateral    COLONOSCOPY     ORIF PROXIMAL TIBIAL PLATEAU FRACTURE Left 02/19/2012   knee   POLYPECTOMY     TONSILLECTOMY     Social History   Socioeconomic History   Marital status: Divorced    Spouse name: Not  on file   Number of children: 2   Years of education: Not on file   Highest education level: Not on file  Occupational History   Occupation: RETIRED  Tobacco Use   Smoking status: Former    Packs/day: 0.25    Years: 50.00    Pack years: 12.50    Types: Cigarettes    Quit date: 08/28/2014    Years since quitting: 5.8   Smokeless tobacco: Never   Tobacco comments:    1 pack every 3 days  Vaping Use   Vaping Use: Never used  Substance and Sexual Activity   Alcohol use: Ayala    Alcohol/week: 0.0 standard drinks   Drug use: Ayala   Sexual activity: Not Currently  Other Topics Concern   Not on file  Social History Narrative   Lives alone has 2 supportive children.      Social Determinants of Health   Financial Resource Strain: Low Risk    Difficulty of Paying Living Expenses: Not hard at all  Food Insecurity: Ayala Food Insecurity   Worried About Charity fundraiser in the Last Year: Never true   [place] in the Last Year: Never true  Transportation Needs: Ayala Transportation Needs   Lack of Transportation (Medical): Ayala   Lack of Transportation (Non-Medical): Ayala  Physical  Activity: Sufficiently Active   Days of Exercise per Week: 5 days   Minutes of Exercise per Session: 30 min  Stress: Ayala Stress Concern Present   Feeling of Stress : Not at all  Social Connections: Moderately Integrated   Frequency of Communication with Friends and Family: More than three times a week   Frequency of Social Gatherings with Friends and Family: More than three times a week   Attends Religious Services: More than 4 times per year   Active Member of [employer] or Organizations: Yes   Attends Archivist Meetings: More than 4 times per year   Marital Status: Widowed   Allergies  Allergen Reactions   Doxycycline Diarrhea   Minocycline Diarrhea   Wellbutrin [Bupropion] Swelling    Per pt her tongue was swollen and sore/symptoms stopped once the medication was discontinued.    Penicillins  Rash    Has patient had a PCN reaction causing immediate rash, facial/tongue/throat swelling, SOB or lightheadedness with hypotension: yes Has patient had a PCN reaction causing severe rash involving mucus membranes or skin necrosis: Ayala Has patient had a PCN reaction that required hospitalization: unknown Has patient had a PCN reaction occurring within the last 10 years: Ayala If all of the above answers are "Ayala", then may proceed with Cephalosporin use.    Z-Pak [Azithromycin] Other (See Comments)    diarrhea   Family History  Problem Relation Age of Onset   Diverticulosis Mother    Ovarian cancer Mother    Pancreatic cancer Father    Lung cancer Father    Colon polyps Sister    Colon cancer Neg Hx     Current Outpatient Medications (Endocrine & Metabolic):    denosumab (PROLIA) 60 MG/ML SOSY injection, INJECT 60 MG INTO THE SKIN ONCE FOR 1 DOSE  Current Outpatient Medications (Cardiovascular):    amLODipine (NORVASC) 5 MG tablet, Take 1.5 tablets (7.5 mg total) by mouth daily.   atorvastatin (LIPITOR) 40 MG tablet, Take 1 tablet (40 mg total) by mouth every other day.   hydrochlorothiazide (HYDRODIURIL) 25 MG tablet, TAKE 1 TABLET(25 MG) BY MOUTH EVERY MORNING   lisinopril (ZESTRIL) 40 MG tablet, TAKE 1 TABLET(40 MG) BY MOUTH DAILY   Current Outpatient Medications (Analgesics):    acetaminophen (TYLENOL) 500 MG tablet, Take 500 mg by mouth every 6 (six) hours as needed.   ibuprofen (ADVIL) 200 MG tablet, Take 200 mg by mouth every 6 (six) hours as needed.  Current Outpatient Medications (Hematological):    Cyanocobalamin (VITAMIN B-12) 2500 MCG SUBL, Place 1 tablet under the tongue daily.  Current Outpatient Medications (Other):    ALPRAZolam (XANAX) 0.5 MG tablet, TAKE 1 TABLET(0.5 MG) BY MOUTH AT BEDTIME AS NEEDED FOR ANXIETY   cholecalciferol (VITAMIN D) 25 MCG (1000 UNIT) tablet, Take 1 tablet by mouth daily.   gabapentin (NEURONTIN) 100 MG capsule, Take 2 capsules (200 mg  total) by mouth at bedtime.   Magnesium 100 MG CAPS, Take 100 mg by mouth daily.   potassium chloride (KLOR-CON) 8 MEQ tablet, TAKE 2 TABLETS(16 MEQ) BY MOUTH DAILY   tiZANidine (ZANAFLEX) 2 MG tablet, Take 1 tablet (2 mg total) by mouth at bedtime.   TURMERIC PO, Take 1 tablet by mouth daily.   Reviewed prior external information including notes and imaging from  primary care provider As well as notes that were available from care everywhere and other healthcare systems.  Past medical history, social, surgical and family history all reviewed in electronic medical  record.  Ayala pertanent information unless stated regarding to the chief complaint.   Review of Systems:  Ayala headache, visual changes, nausea, vomiting, diarrhea, constipation, dizziness, abdominal pain, skin rash, fevers, chills, night sweats, weight loss, swollen lymph nodes, joint swelling, chest pain, shortness of breath, mood changes. POSITIVE muscle aches, body aches Objective  Blood pressure 110/78, pulse (!) 104, height 5\' 4"  (1.626 m), weight 168 lb (76.2 kg), SpO2 97 %.   General: Ayala apparent distress alert and oriented x3 mood and affect normal, dressed appropriately.  HEENT: Pupils equal, extraocular movements intact  Gait  antalgic but patient does not need any help with bearing weight MSK: Left ankle exam tender to palpation over the lateral malleolus distally.  Patient does have good range of motion of the ankle.  Ayala pain on the posterior aspect of the ankle.  Neurovascular intact distally.  Right rib cage shows the patient is tender to palpation over the fifth and sixth ribs laterally as well as posteriorly but Ayala pain anteriorly.  Patient is able to take deep breaths.  Good range of motion of the shoulder.  Good grip strength noted.  Ayala bruising noted to the skin   Limited musculoskeletal ultrasound was performed and interpreted by Lyndal Pulley Limited musculoskeletal ultrasound for the patient has a very small  avulsion fracture noted of the lateral malleolus.  Ayala significant effusion of the joint noted.  Very minimal soft tissue swelling noted. Impression: Very small fracture of the   Impression and Recommendations:     The above documentation has been reviewed and is accurate and complete Lyndal Pulley, DO

## 2020-07-13 ENCOUNTER — Emergency Department (HOSPITAL_COMMUNITY): Payer: Medicare Other

## 2020-07-13 ENCOUNTER — Other Ambulatory Visit: Payer: Self-pay

## 2020-07-13 ENCOUNTER — Emergency Department (HOSPITAL_COMMUNITY)
Admission: EM | Admit: 2020-07-13 | Discharge: 2020-07-13 | Disposition: A | Discharge status: Home / Self Care | Payer: Medicare Other | Attending: Emergency Medicine | Admitting: Emergency Medicine

## 2020-07-13 ENCOUNTER — Encounter (HOSPITAL_COMMUNITY): Payer: Self-pay | Admitting: Emergency Medicine

## 2020-07-13 DIAGNOSIS — Z79899 Other long term (current) drug therapy: Secondary | ICD-10-CM | POA: Insufficient documentation

## 2020-07-13 DIAGNOSIS — I10 Essential (primary) hypertension: Secondary | ICD-10-CM | POA: Insufficient documentation

## 2020-07-13 DIAGNOSIS — J449 Chronic obstructive pulmonary disease, unspecified: Secondary | ICD-10-CM | POA: Insufficient documentation

## 2020-07-13 DIAGNOSIS — E119 Type 2 diabetes mellitus without complications: Secondary | ICD-10-CM | POA: Diagnosis not present

## 2020-07-13 DIAGNOSIS — Z87891 Personal history of nicotine dependence: Secondary | ICD-10-CM | POA: Diagnosis not present

## 2020-07-13 DIAGNOSIS — R059 Cough, unspecified: Secondary | ICD-10-CM | POA: Diagnosis not present

## 2020-07-13 DIAGNOSIS — R0781 Pleurodynia: Secondary | ICD-10-CM | POA: Insufficient documentation

## 2020-07-13 NOTE — ED Triage Notes (Signed)
Patient here from home reporting cough that started Friday with congestion. Reports that she fell 2 weeks ago injuring rib and dr suggested she take deep breathes every hour but she has forgotten to do so . Worried about pneumonia.

## 2020-07-13 NOTE — Discharge Instructions (Addendum)
Chest x-ray shows no infection.  Continue your current course of management.

## 2020-07-13 NOTE — ED Provider Notes (Signed)
[facility] DEPT Provider Note   CSN: [id number] Arrival date & time: 07/13/20  9024     History Chief Complaint  Patient presents with  . Cough    Kristina Ayala is a 75 y.o. female.  Patient injured her right side of her ribs and left ankle several weeks ago.  Thinks that she might have pneumonia as she has had some a cough and ongoing pain to the right side of her ribs.  She had negative imaging at time of injury.  She has no fever.  The history is provided by the patient.  Cough Cough characteristics:  Non-productive Sputum characteristics:  Nondescript Severity:  Mild Onset quality:  Gradual Duration:  2 weeks Timing:  Intermittent Progression:  Waxing and waning Chronicity:  New Relieved by:  Nothing Worsened by:  Nothing Associated symptoms: no chest pain, no chills, no ear pain, no fever, no rash, no shortness of breath and no sore throat       Past Medical History:  Diagnosis Date  . Arthritis   . Bursitis    left elbow  . Clostridium difficile infection   . Diabetes mellitus without complication ([place])   . Frequent falls   . HLD (hyperlipidemia)   . Hypertension   . Infectious colitis     Patient Active Problem List   Diagnosis Date Noted  . Right rib fracture 07/10/2020  . Left ankle injury 07/10/2020  . Closed low lateral malleolus fracture 07/10/2020  . Snoring 05/23/2020  . Cough 12/04/2019  . Pancreatic mass 11/08/2019  . DOE (dyspnea on exertion) 03/20/2019  . Chronic left SI joint pain 03/13/2019  . Leg cramping 01/10/2019  . Osteoarthritis 01/10/2019  . Right lower quadrant abdominal pain 11/09/2018  . Adnexal mass, right 11/09/2018  . Change in stool 10/18/2018  . Lung nodules 06/19/2018  . Abnormal findings on diagnostic imaging of lung 06/08/2018  . Thyromegaly 10/28/2017  . Restless leg syndrome 10/12/2017  . Greater trochanteric bursitis of left hip 07/26/2017  . Hypokalemia 04/18/2017  .  Acute bronchitis 12/28/2016  . Degenerative arthritis of left knee 12/01/2016  . Anxiety 12/01/2016  . Diabetes mellitus without complication ([place]) 09/73/5329  . Bursitis of right shoulder 09/04/2015  . Lateral meniscus derangement 06/03/2015  . Tear of LCL (lateral collateral ligament) of knee 06/03/2015  . Lumbar radiculopathy 09/19/2014  . Low back pain 01/04/2014  . Bilateral knee pain 01/04/2014  . Diverticulosis of colon without hemorrhage 07/26/2013  . Trigger thumb of left hand 03/06/2013  . Essential hypertension, benign 02/15/2013  . Hyperlipidemia 09/28/2012  . COPD (chronic obstructive pulmonary disease) ([place]) 05/22/2012  . Osteoporosis 05/22/2012    Past Surgical History:  Procedure Laterality Date  . CATARACT EXTRACTION Bilateral   . COLONOSCOPY    . ORIF PROXIMAL TIBIAL PLATEAU FRACTURE Left 02/19/2012   knee  . POLYPECTOMY    . TONSILLECTOMY       OB History   No obstetric history on file.     Family History  Problem Relation Age of Onset  . Diverticulosis Mother   . Ovarian cancer Mother   . Pancreatic cancer Father   . Lung cancer Father   . Colon polyps Sister   . Colon cancer Neg Hx     Social History   Tobacco Use  . Smoking status: Former    Packs/day: 0.25    Years: 50.00    Pack years: 12.50    Types: Cigarettes    Quit date:  08/28/2014    Years since quitting: 5.8  . Smokeless tobacco: Never  . Tobacco comments:    1 pack every 3 days  Vaping Use  . Vaping Use: Never used  Substance Use Topics  . Alcohol use: No    Alcohol/week: 0.0 standard drinks  . Drug use: No    Home Medications Prior to Admission medications   Medication Sig Start Date End Date Taking? Authorizing Provider  acetaminophen (TYLENOL) 500 MG tablet Take 500 mg by mouth every 6 (six) hours as needed for mild pain, fever or headache.   Yes [provider]  amLODipine (NORVASC) 5 MG tablet Take 1.5 tablets (7.5 mg total) by mouth daily. 03/03/20  Yes  Burns, Claudina Lick, MD  atorvastatin (LIPITOR) 40 MG tablet Take 1 tablet (40 mg total) by mouth every other day. 08/20/19  Yes Burns, Claudina Lick, MD  Camphor-Eucalyptus-Menthol (VICKS VAPORUB EX) Apply 1 application topically as needed (congestion).   Yes [provider]  cholecalciferol (VITAMIN D) 25 MCG (1000 UNIT) tablet Take 1,000 Units by mouth daily.   Yes [provider]  Cyanocobalamin (VITAMIN B-12) 2500 MCG SUBL Place 2,500 mcg under the tongue daily.   Yes [provider]  denosumab (PROLIA) 60 MG/ML SOSY injection INJECT 60 MG INTO THE SKIN ONCE FOR 1 DOSE Patient taking differently: Inject 60 mg into the skin every 6 (six) months. 07/03/20  Yes Burns, Claudina Lick, MD  gabapentin (NEURONTIN) 100 MG capsule Take 2 capsules (200 mg total) by mouth at bedtime. Patient taking differently: Take 200 mg by mouth at bedtime as needed (neuropathy). 02/28/20  Yes Lyndal Pulley, DO  guaifenesin (ROBITUSSIN) 100 MG/5ML syrup Take 200 mg by mouth 3 (three) times daily as needed for cough.   Yes [provider]  lisinopril (ZESTRIL) 40 MG tablet TAKE 1 TABLET(40 MG) BY MOUTH DAILY Patient taking differently: Take 40 mg by mouth daily. 06/05/20  Yes Burns, Claudina Lick, MD  potassium chloride (KLOR-CON) 8 MEQ tablet TAKE 2 TABLETS(16 MEQ) BY MOUTH DAILY Patient taking differently: Take 16 mEq by mouth daily. 06/05/20  Yes Burns, Claudina Lick, MD  TURMERIC PO Take 1 tablet by mouth daily.   Yes [provider]  ALPRAZolam (XANAX) 0.5 MG tablet TAKE 1 TABLET(0.5 MG) BY MOUTH AT BEDTIME AS NEEDED FOR ANXIETY Patient not taking: Reported on 07/13/2020 07/03/20   [name], MD  hydrochlorothiazide (HYDRODIURIL) 25 MG tablet TAKE 1 TABLET(25 MG) BY MOUTH EVERY MORNING 07/14/20   [name], MD    Allergies    Doxycycline, Minocycline, Wellbutrin [bupropion], Penicillins, and Z-pak [azithromycin]  Review of Systems   Review of Systems  Constitutional:  Negative for chills  and fever.  HENT:  Negative for ear pain and sore throat.   Eyes:  Negative for pain and visual disturbance.  Respiratory:  Positive for cough. Negative for shortness of breath.   Cardiovascular:  Negative for chest pain and palpitations.  Gastrointestinal:  Negative for abdominal pain and vomiting.  Genitourinary:  Negative for dysuria and hematuria.  Musculoskeletal:  Negative for arthralgias and back pain.  Skin:  Negative for color change and rash.  Neurological:  Negative for seizures and syncope.  All other systems reviewed and are negative.  Physical Exam Updated Vital Signs BP 130/73   Pulse 88   Temp 98.6 F (37 C) (Oral)   Resp 16   SpO2 100%   Physical Exam Vitals and nursing note reviewed.  Constitutional:  General: She is not in acute distress.    Appearance: She is well-developed. She is not ill-appearing.  HENT:     Head: Normocephalic and atraumatic.  Cardiovascular:     Rate and Rhythm: Normal rate and regular rhythm.     Pulses: Normal pulses.     Heart sounds: Normal heart sounds. No murmur heard. Pulmonary:     Effort: Pulmonary effort is normal. No respiratory distress.     Breath sounds: Normal breath sounds.  Musculoskeletal:     Cervical back: Neck supple.  Skin:    General: Skin is warm and dry.  Neurological:     Mental Status: She is alert.    ED Results / Procedures / Treatments   Labs (all labs ordered are listed, but only abnormal results are displayed) Labs Reviewed - No data to display  EKG None  Radiology No results found.  Procedures Procedures   Medications Ordered in ED Medications - No data to display  ED Course  I have reviewed the triage vital signs and the nursing notes.  Pertinent labs & imaging results that were available during my care of the patient were reviewed by me and considered in my medical decision making (see chart for details).    MDM Rules/Calculators/A&P                          Hala A  [place] patient is here with cough.  She is concerned she has pneumonia after bruising her ribs several weeks ago.  No fever, normal vitals.  Very well-appearing.  Clear breath sounds.  Nondescript cough.  She is following with sports medicine for rib contusions and using incentive spirometer.  She is also following up for left ankle sprain that appears to be healing well.  Chest x-ray showed no evidence of pneumonia.  She is given reassurance and discharged in ED in good condition.  This chart was dictated using voice recognition software.  Despite best efforts to proofread,  errors can occur which can change the documentation meaning.  Final Clinical Impression(s) / ED Diagnoses Final diagnoses:  Cough    Rx / DC Orders ED Discharge Orders     None        Lennice Sites, DO 07/16/20 1537

## 2020-07-13 NOTE — ED Notes (Signed)
Verbalized understanding discharge instructions and follow-up. In no acute distress.   

## 2020-07-14 ENCOUNTER — Telehealth: Payer: Self-pay | Admitting: Internal Medicine

## 2020-07-14 ENCOUNTER — Other Ambulatory Visit: Payer: Self-pay | Admitting: Internal Medicine

## 2020-07-14 NOTE — Telephone Encounter (Signed)
1.Medication Requested: cephALEXin (KEFLEX) 500 MG capsule   2. Pharmacy (Name, [facility], [place]): [facility]   3. On Med List: no   4. Last Visit with PCP: 06-03-20  5. Next visit date with PCP: 09-01-20  Patient is requesting a call back    Agent: Please be advised that RX refills may take up to 3 business days. We ask that you follow-up with your pharmacy.

## 2020-07-14 NOTE — Telephone Encounter (Signed)
Needs appt if she needs an antibiotic

## 2020-07-15 DIAGNOSIS — Z20822 Contact with and (suspected) exposure to covid-19: Secondary | ICD-10-CM | POA: Diagnosis not present

## 2020-07-15 NOTE — Telephone Encounter (Signed)
Called patient, she said that she will just keep doing what she is doing. She went to the ED on 07-13-20.

## 2020-07-21 ENCOUNTER — Ambulatory Visit (INDEPENDENT_AMBULATORY_CARE_PROVIDER_SITE_OTHER): Payer: Medicare Other

## 2020-07-21 DIAGNOSIS — R0781 Pleurodynia: Secondary | ICD-10-CM

## 2020-07-21 NOTE — Progress Notes (Signed)
[place] [address] [place] Phone: [phone] Subjective:   I Kristina Ayala am serving as a Education administrator for Dr. Hulan Saas.  This visit occurred during the SARS-CoV-2 public health emergency.  Safety protocols were in place, including screening questions prior to the visit, additional usage of staff PPE, and extensive cleaning of exam room while observing appropriate contact time as indicated for disinfecting solutions.   I'm seeing this patient by the request  of:  [name], MD  CC: Fracture follow-up, ankle follow-up  [id number]  07/10/2020 On ultrasound today discussed with patient at great length.  Discussed that there is likely an avulsion fracture noted but patient did not feel the Aircast was beneficial and if anything hurt more.  Patient is able to ambulate without the aid of walker.  Discussed compression on the ankle and home exercises given to start in 1 week.  Patient will follow up again in 2 to 3 weeks otherwise.  Can consider formal physical therapy but secondary to financial constraints patient declined  Patient does have a rib fracture noted.  Minimally displaced at the T5.  Discussed with patient in great length.  Patient is able to tolerate the pain at the moment.  Discussed the importance of taking deep breaths.  We will get a repeat x-ray in 2 to 3 weeks   Update 07/22/2020 Kristina Ayala is a 75 y.o. female coming in with complaint of L ankle and R rib fracture. Patient states she still has pain in the ribs on the right side. The ankle has some swelling. Wearing compression sock today.  Patient states that overall though it is making improvement with both of them.  Feels like the ankle is moving slower than the ribs.  Patient is able to do most daily activities.  Does state that she gets sore in the ankle with certain amount of time standing or certain movements.  All the pain on the ankle is on the lateral  aspect.     Past Medical History:  Diagnosis Date   Arthritis    Bursitis    left elbow   Clostridium difficile infection    Diabetes mellitus without complication (HCC)    Frequent falls    HLD (hyperlipidemia)    Hypertension    Infectious colitis    Past Surgical History:  Procedure Laterality Date   CATARACT EXTRACTION Bilateral    COLONOSCOPY     ORIF PROXIMAL TIBIAL PLATEAU FRACTURE Left 02/19/2012   knee   POLYPECTOMY     TONSILLECTOMY     Social History   Socioeconomic History   Marital status: Divorced    Spouse name: Not on file   Number of children: 2   Years of education: Not on file   Highest education level: Not on file  Occupational History   Occupation: RETIRED  Tobacco Use   Smoking status: Former    Packs/day: 0.25    Years: 50.00    Pack years: 12.50    Types: Cigarettes    Quit date: 08/28/2014    Years since quitting: 5.9   Smokeless tobacco: Never   Tobacco comments:    1 pack every 3 days  Vaping Use   Vaping Use: Never used  Substance and Sexual Activity   Alcohol use: No    Alcohol/week: 0.0 standard drinks   Drug use: No   Sexual activity: Not Currently  Other Topics Concern   Not on file  Social History Narrative   Lives alone has 2 supportive children.      Social Determinants of Health   Financial Resource Strain: Low Risk    Difficulty of Paying Living Expenses: Not hard at all  Food Insecurity: No Food Insecurity   Worried About Charity fundraiser in the Last Year: Never true   [place] in the Last Year: Never true  Transportation Needs: No Transportation Needs   Lack of Transportation (Medical): No   Lack of Transportation (Non-Medical): No  Physical Activity: Sufficiently Active   Days of Exercise per Week: 5 days   Minutes of Exercise per Session: 30 min  Stress: No Stress Concern Present   Feeling of Stress : Not at all  Social Connections: Moderately Integrated   Frequency of Communication with  Friends and Family: More than three times a week   Frequency of Social Gatherings with Friends and Family: More than three times a week   Attends Religious Services: More than 4 times per year   Active Member of [employer] or Organizations: Yes   Attends Archivist Meetings: More than 4 times per year   Marital Status: Widowed   Allergies  Allergen Reactions   Doxycycline Diarrhea   Minocycline Diarrhea   Wellbutrin [Bupropion] Swelling    Per pt her tongue was swollen and sore/symptoms stopped once the medication was discontinued.    Penicillins Rash    Has patient had a PCN reaction causing immediate rash, facial/tongue/throat swelling, SOB or lightheadedness with hypotension: yes Has patient had a PCN reaction causing severe rash involving mucus membranes or skin necrosis: no Has patient had a PCN reaction that required hospitalization: unknown Has patient had a PCN reaction occurring within the last 10 years: no If all of the above answers are "NO", then may proceed with Cephalosporin use.    Z-Pak [Azithromycin] Other (See Comments)    diarrhea   Family History  Problem Relation Age of Onset   Diverticulosis Mother    Ovarian cancer Mother    Pancreatic cancer Father    Lung cancer Father    Colon polyps Sister    Colon cancer Neg Hx     Current Outpatient Medications (Endocrine & Metabolic):    denosumab (PROLIA) 60 MG/ML SOSY injection, INJECT 60 MG INTO THE SKIN ONCE FOR 1 DOSE (Patient taking differently: Inject 60 mg into the skin every 6 (six) months.)  Current Outpatient Medications (Cardiovascular):    amLODipine (NORVASC) 5 MG tablet, Take 1.5 tablets (7.5 mg total) by mouth daily.   atorvastatin (LIPITOR) 40 MG tablet, Take 1 tablet (40 mg total) by mouth every other day.   hydrochlorothiazide (HYDRODIURIL) 25 MG tablet, TAKE 1 TABLET(25 MG) BY MOUTH EVERY MORNING   lisinopril (ZESTRIL) 40 MG tablet, TAKE 1 TABLET(40 MG) BY MOUTH DAILY (Patient taking  differently: Take 40 mg by mouth daily.)  Current Outpatient Medications (Respiratory):    Camphor-Eucalyptus-Menthol (VICKS VAPORUB EX), Apply 1 application topically as needed (congestion).   guaifenesin (ROBITUSSIN) 100 MG/5ML syrup, Take 200 mg by mouth 3 (three) times daily as needed for cough.  Current Outpatient Medications (Analgesics):    acetaminophen (TYLENOL) 500 MG tablet, Take 500 mg by mouth every 6 (six) hours as needed for mild pain, fever or headache.  Current Outpatient Medications (Hematological):    Cyanocobalamin (VITAMIN B-12) 2500 MCG SUBL, Place 2,500 mcg under the tongue daily.  Current Outpatient Medications (Other):    ALPRAZolam (XANAX) 0.5 MG tablet, TAKE  1 TABLET(0.5 MG) BY MOUTH AT BEDTIME AS NEEDED FOR ANXIETY   cholecalciferol (VITAMIN D) 25 MCG (1000 UNIT) tablet, Take 1,000 Units by mouth daily.   gabapentin (NEURONTIN) 100 MG capsule, Take 2 capsules (200 mg total) by mouth at bedtime. (Patient taking differently: Take 200 mg by mouth at bedtime as needed (neuropathy).)   potassium chloride (KLOR-CON) 8 MEQ tablet, TAKE 2 TABLETS(16 MEQ) BY MOUTH DAILY (Patient taking differently: Take 16 mEq by mouth daily.)   TURMERIC PO, Take 1 tablet by mouth daily.   Reviewed prior external information including notes and imaging from  primary care provider As well as notes that were available from care everywhere and other healthcare systems.  Past medical history, social, surgical and family history all reviewed in electronic medical record.  No pertanent information unless stated regarding to the chief complaint.   Review of Systems:  No headache, visual changes, nausea, vomiting, diarrhea, constipation, dizziness, abdominal pain, skin rash, fevers, chills, night sweats, weight loss, swollen lymph nodes, , chest pain, shortness of breath, mood changes. POSITIVE muscle aches, body aches, joint swelling  Objective  Blood pressure (!) 150/80, pulse 93, height 5'  4" (1.626 m), weight 166 lb (75.3 kg), SpO2 97 %.   General: No apparent distress alert and oriented x3 mood and affect normal, dressed appropriately.  HEENT: Pupils equal, extraocular movements intact  Respiratory: Patient's speak in full sentences and does not appear short of breath  Cardiovascular: Trace lower extremity edema, non tender, no erythema  Gait very mild antalgic walking with the aid of a cane Right rib.  Does not seem to be very tender on palpation today.  Patient is able to take deep breaths without any significant difficulty. Left ankle does have trace effusion noted.  More pain still on the lateral malleolus at the inferior tip.  No pain in the calf.  No pain of the proximal fibula.  Patient has fairly good strength of the ankle with plantarflexion and dorsiflexion as well as supination and pronation.  Limited musculoskeletal ultrasound was performed Lyndal Pulley  Limited ultrasound of patient's left ankle still shows that there is a cortical irregularity of the anterior edge of the lateral malleolus.  Does have what appears to be callus formation noted in the area.  No significant intra-articular effusion noted though. Impression: Cortical irregularity of the inferior tip of the lateral malleolus     Impression and Recommendations:     The above documentation has been reviewed and is accurate and complete Lyndal Pulley, DO

## 2020-07-22 ENCOUNTER — Encounter: Payer: Self-pay | Admitting: Family Medicine

## 2020-07-22 ENCOUNTER — Ambulatory Visit: Payer: Medicare Other | Admitting: Family Medicine

## 2020-07-22 ENCOUNTER — Ambulatory Visit (INDEPENDENT_AMBULATORY_CARE_PROVIDER_SITE_OTHER): Payer: Medicare Other

## 2020-07-22 ENCOUNTER — Other Ambulatory Visit: Payer: Self-pay

## 2020-07-22 ENCOUNTER — Ambulatory Visit: Payer: Self-pay

## 2020-07-22 VITALS — BP 150/80 | HR 93 | Ht 64.0 in | Wt 166.0 lb

## 2020-07-22 DIAGNOSIS — G8929 Other chronic pain: Secondary | ICD-10-CM | POA: Diagnosis not present

## 2020-07-22 DIAGNOSIS — M25572 Pain in left ankle and joints of left foot: Secondary | ICD-10-CM | POA: Diagnosis not present

## 2020-07-22 DIAGNOSIS — S8262XA Displaced fracture of lateral malleolus of left fibula, initial encounter for closed fracture: Secondary | ICD-10-CM | POA: Diagnosis not present

## 2020-07-22 DIAGNOSIS — S2231XD Fracture of one rib, right side, subsequent encounter for fracture with routine healing: Secondary | ICD-10-CM

## 2020-07-22 DIAGNOSIS — Z20822 Contact with and (suspected) exposure to covid-19: Secondary | ICD-10-CM | POA: Diagnosis not present

## 2020-07-22 NOTE — Patient Instructions (Addendum)
Good to see you Ankle xray today Still have a ways to go with the ankle Keep doing compression and exercises See me again in 4 weeks ok to double book

## 2020-07-22 NOTE — Assessment & Plan Note (Signed)
Patient is doing much better at this time.  Patient's x-rays previously seem to be more on the left side but no tenderness on the left or the right at the moment.  Able to take deep breaths.  No change in management as long as patient continues to do well.  Discussed with patient if any shortness of breath or abdominal pain to seek medical attention or to call us immediately.

## 2020-07-22 NOTE — Assessment & Plan Note (Signed)
Patient is does seem to be making some improvement overall.  He did have the lateral malleolus fracture and is unable to wear the brace.  Doing better with the compression sock.  Could consider formal physical therapy but patient would like to continue with the range of motion exercises at home and increase activity as tolerated.  Discussed worsening symptoms or any type of swelling in the calf and to seek medical attention immediately.  Follow-up with me again in 4 to 5 weeks.

## 2020-07-31 DIAGNOSIS — Z20822 Contact with and (suspected) exposure to covid-19: Secondary | ICD-10-CM | POA: Diagnosis not present

## 2020-08-04 ENCOUNTER — Ambulatory Visit (INDEPENDENT_AMBULATORY_CARE_PROVIDER_SITE_OTHER): Payer: Medicare Other | Admitting: Neurology

## 2020-08-04 ENCOUNTER — Encounter: Payer: Self-pay | Admitting: Neurology

## 2020-08-04 ENCOUNTER — Other Ambulatory Visit: Payer: Self-pay

## 2020-08-04 VITALS — BP 127/80 | HR 81 | Ht 65.0 in | Wt 166.0 lb

## 2020-08-04 DIAGNOSIS — G4719 Other hypersomnia: Secondary | ICD-10-CM | POA: Diagnosis not present

## 2020-08-04 DIAGNOSIS — R0683 Snoring: Secondary | ICD-10-CM

## 2020-08-04 DIAGNOSIS — J438 Other emphysema: Secondary | ICD-10-CM | POA: Diagnosis not present

## 2020-08-04 NOTE — Progress Notes (Signed)
SLEEP MEDICINE CLINIC    Provider:  Larey Seat, MD  Primary Care Physician:  [name], MD [place] [address]     Referring Provider: [name], [place],  [place] [address]          Chief Complaint according to patient   Patient presents with:     New Patient (Initial Visit)           HISTORY OF PRESENT ILLNESS:  Kristina Ayala is a 75 y.o. year old -African American female patient seen here as a referral on 08/04/2020 from Dr Quay Burow, MD- originally from [place], [place], here for a Sleep consultation.  Chief concern according to patient :  "My son told me about my "excessive snoring". The Pt was unaware of the snoring, was told by told of loud snoring by Son in April of 22-Former smoker- cirgaettes-  she does wake up with dry mouth and wakes up thirsty.Pt mostly sleeps on her sides.   I have the pleasure of seeing Kristina Ayala on 08-04-2020, a right-handed 34 or [place] American female with a possible sleep disorder.  She  has a past medical history of Arthritis, Bursitis, Clostridium difficile infection, Diabetes mellitus without complication ([facility]), Frequent falls (2014- Dr Leonie Man)  HLD (hyperlipidemia), Hypertension, and Infectious colitis. She reports she still falls, and loses balance- no warning- usually she is in a hurry when this happens, last fall in June 2022- in a lavatory".  She has a ankle strain, not fracture. She has finished PT.     Sleep relevant medical history: Tonsillectomy at age 56, no other ENT surgery-  No TBI, No neck injuries.   Family medical /sleep history: daughter ( 32)  on CPAP with OSA, no insomnia, sleep walkers.    Social history:  Patient is retired from [employer] and lives in a household alone. Widowed- 2 adult children, son is 34, daughter is 30. 2 grandchildren.  The patient used to work in shifts( Presenter, broadcasting,) Pets are not present. Tobacco use- yes, many  years- quit 2016.  ETOH use rarely,  Caffeine intake in form of Coffee( only decaff) Soda( /) Tea ( decaff) or energy drinks. Regular exercise in form of walking- water aerobics until the pandemic.    Hobbies : reading, teaching.       Sleep habits are as follows: The patient's dinner time is between 6-7 PM. The patient goes to bed at 7.30 PM and continues to sleep for 6 hours, wakes rarely for bathroom breaks. The preferred sleep position is variable- and she wakes up in supine, due to hip and neck pain - with the support of 1 pillow.  Dreams are reportedly frequent/vivid.   6 AM is the usual rise time. The patient wakes up spontaneously at 5 AM   She reports feeling refreshed or restored in AM, with symptoms such as dry mouth, but no morning headaches.. Naps are taken infrequently, lasting from 30 to 40 minutes and are more refreshing than nocturnal sleep.    Review of Systems: Out of a complete 14 system review, the patient complains of only the following symptoms, and all other reviewed systems are negative.:  Fatigue, snoring,    How likely are you to doze in the following situations: 0 = not likely, 1 = slight chance, 2 = moderate chance, 3 = high chance   Sitting and Reading? 2 Watching Television? 2 Sitting inactive in  a public place (theater or meeting)? 2 As a passenger in a car for an hour without a break? 3 Lying down in the afternoon when circumstances permit?3 Sitting and talking to someone?0 Sitting quietly after lunch without alcohol?2 In a car, while stopped for a few minutes in traffic?0   Total = 14/ 24 points   FSS endorsed at 30/ 63 points.   Social History   Socioeconomic History   Marital status: Divorced    Spouse name: Not on file   Number of children: 2   Years of education: Not on file   Highest education level: Associate degree: academic program  Occupational History   Occupation: RETIRED  Tobacco Use   Smoking status: Former    Packs/day: 0.25     Years: 50.00    Pack years: 12.50    Types: Cigarettes    Quit date: 08/28/2014    Years since quitting: 5.9   Smokeless tobacco: Never   Tobacco comments:    1 pack every 3 days  Vaping Use   Vaping Use: Never used  Substance and Sexual Activity   Alcohol use: No    Alcohol/week: 0.0 standard drinks   Drug use: No   Sexual activity: Not Currently  Other Topics Concern   Not on file  Social History Narrative   Lives alone    Right handed   Caffeine: 3x weekly         Social Determinants of Health   Financial Resource Strain: Low Risk    Difficulty of Paying Living Expenses: Not hard at all  Food Insecurity: No Food Insecurity   Worried About Charity fundraiser in the Last Year: Never true   [place] in the Last Year: Never true  Transportation Needs: No Transportation Needs   Lack of Transportation (Medical): No   Lack of Transportation (Non-Medical): No  Physical Activity: Sufficiently Active   Days of Exercise per Week: 5 days   Minutes of Exercise per Session: 30 min  Stress: No Stress Concern Present   Feeling of Stress : Not at all  Social Connections: Moderately Integrated   Frequency of Communication with Friends and Family: More than three times a week   Frequency of Social Gatherings with Friends and Family: More than three times a week   Attends Religious Services: More than 4 times per year   Active Member of [employer] or Organizations: Yes   Attends Archivist Meetings: More than 4 times per year   Marital Status: Widowed    Family History  Problem Relation Age of Onset   Diverticulosis Mother    Ovarian cancer Mother    Pancreatic cancer Father    Lung cancer Father    Colon polyps Sister    Colon cancer Neg Hx     Past Medical History:  Diagnosis Date   Arthritis    Bursitis    left elbow   Clostridium difficile infection    Diabetes mellitus without complication ([facility])    Frequent falls    HLD (hyperlipidemia)     Hypertension    Infectious colitis     Past Surgical History:  Procedure Laterality Date   CATARACT EXTRACTION Bilateral    COLONOSCOPY     ORIF PROXIMAL TIBIAL PLATEAU FRACTURE Left 02/19/2012   knee   POLYPECTOMY     TONSILLECTOMY       Current Outpatient Medications on File Prior to Visit  Medication Sig Dispense Refill   acetaminophen (  TYLENOL) 500 MG tablet Take 500 mg by mouth every 6 (six) hours as needed for mild pain, fever or headache.     ALPRAZolam (XANAX) 0.5 MG tablet TAKE 1 TABLET(0.5 MG) BY MOUTH AT BEDTIME AS NEEDED FOR ANXIETY 30 tablet 2   amLODipine (NORVASC) 5 MG tablet Take 1.5 tablets (7.5 mg total) by mouth daily. 135 tablet 1   atorvastatin (LIPITOR) 40 MG tablet Take 1 tablet (40 mg total) by mouth every other day. 45 tablet 1   Camphor-Eucalyptus-Menthol (VICKS VAPORUB EX) Apply 1 application topically as needed (congestion).     cholecalciferol (VITAMIN D) 25 MCG (1000 UNIT) tablet Take 2,000 Units by mouth daily.     Cyanocobalamin (VITAMIN B-12) 2500 MCG SUBL Place 2,500 mcg under the tongue daily.     denosumab (PROLIA) 60 MG/ML SOSY injection INJECT 60 MG INTO THE SKIN ONCE FOR 1 DOSE (Patient taking differently: Inject 60 mg into the skin every 6 (six) months.) 1 mL 0   gabapentin (NEURONTIN) 100 MG capsule Take 2 capsules (200 mg total) by mouth at bedtime. (Patient taking differently: Take 200 mg by mouth at bedtime as needed (neuropathy).) 180 capsule 0   guaifenesin (ROBITUSSIN) 100 MG/5ML syrup Take 200 mg by mouth 3 (three) times daily as needed for cough.     hydrochlorothiazide (HYDRODIURIL) 25 MG tablet TAKE 1 TABLET(25 MG) BY MOUTH EVERY MORNING 90 tablet 3   lisinopril (ZESTRIL) 40 MG tablet TAKE 1 TABLET(40 MG) BY MOUTH DAILY (Patient taking differently: Take 40 mg by mouth daily.) 90 tablet 1   potassium chloride (KLOR-CON) 8 MEQ tablet TAKE 2 TABLETS(16 MEQ) BY MOUTH DAILY (Patient taking differently: Take 16 mEq by mouth daily.) 180 tablet 1    TURMERIC PO Take 1 tablet by mouth daily.     No current facility-administered medications on file prior to visit.    Allergies  Allergen Reactions   Doxycycline Diarrhea   Minocycline Diarrhea   Wellbutrin [Bupropion] Swelling    Per pt her tongue was swollen and sore/symptoms stopped once the medication was discontinued.    Penicillins Rash    Has patient had a PCN reaction causing immediate rash, facial/tongue/throat swelling, SOB or lightheadedness with hypotension: yes Has patient had a PCN reaction causing severe rash involving mucus membranes or skin necrosis: no Has patient had a PCN reaction that required hospitalization: unknown Has patient had a PCN reaction occurring within the last 10 years: no If all of the above answers are "NO", then may proceed with Cephalosporin use.    Z-Pak [Azithromycin] Other (See Comments)    diarrhea    Physical exam:    There were no vitals filed for this visit. There is no height or weight on file to calculate BMI.   Wt Readings from Last 3 Encounters:  07/22/20 166 lb (75.3 kg)  07/10/20 168 lb (76.2 kg)  06/03/20 169 lb 3.2 oz (76.7 kg)     Ht Readings from Last 3 Encounters:  07/22/20 5\' 4"  (1.626 m)  07/10/20 5\' 4"  (1.626 m)  06/03/20 5\' 4"  (1.626 m)      General: The patient is awake, alert and appears not in acute distress. The patient is well groomed. Head: Normocephalic, atraumatic. Neck is supple. Mallampati 3,  neck circumference:15.5 inches . Nasal airflow  patent.  Retrognathia is  seen.  Dental status: crowded.  Cardiovascular:  Regular rate and cardiac rhythm by pulse,  without distended neck veins. Respiratory: Lungs are clear to auscultation.  Skin:  Without evidence of ankle edema, or rash. Trunk: The patient's posture is erect.   Neurologic exam : The patient is awake and alert, oriented to place and time.   Memory subjective described as intact.  Attention span & concentration ability appears normal.   Speech is fluent,  without dysarthria, dysphonia or aphasia.  Mood and affect are appropriate.   Cranial nerves: no loss of smell or taste reported  Pupils are equal and briskly reactive to light. Funduscopic exam deferred. .  Extraocular movements in vertical and horizontal planes were intact and without nystagmus. No Diplopia. Visual fields by finger perimetry are intact. Hearing was intact to soft voice and finger rubbing.    Facial sensation intact to fine touch.  Facial motor strength is symmetric and tongue and uvula move midline.  Neck ROM : rotation, tilt and flexion extension were normal for age and shoulder shrug was symmetrical.    Motor exam:  Symmetric bulk, tone and ROM.   Normal tone without cog wheeling, symmetric grip strength .     Sensory:  Fine touch and vibration were normal.   She reports difficulties closing jewellery clasps. And handwriting is unchanged.  Proprioception tested in the upper extremities was normal.   Coordination: Rapid alternating movements in the fingers/hands were of normal speed.  The Finger-to-nose maneuver was intact without evidence of ataxia, dysmetria or tremor.   Gait and station: Patient could rise unassisted from a seated position, walked with a cane as assistive device. ( Observation)   Stance is of normal width/ base and the patient turned with 4 steps.  Toe and heel walk were deferred.  Deep tendon reflexes: in the  upper and lower extremities are symmetric and intact.  Babinski response was deferred.       After spending a total time of 45  minutes face to face and additional time for physical and neurologic examination, review of laboratory studies,  personal review of imaging studies, reports and results of other testing and review of referral information / records as far as provided in visit, I have established the following assessments:  1) patient with long smoking history now reported to have started to snore: she was  forced to sleep supine due to hip pain and neck pain- this has been a precursor.  2) she feels tired - and naps frequently- EDS.  3) long standing balance problems that appear unrelated to neuropathy or vertigo- orthopedic problems.    My Plan is to proceed with:  1) HST to screen for OSA. Try melatonin for sleeping longer- and daily exposure to daylight to start your day. You may sleep longer if you use light blocking curtains or eye shades.   I would like to thank [name], MD [place],  [place] [id number] for allowing me to meet with and to take care of this pleasant patient.   In short, Kristina Ayala is presenting with increasing daytime sleepiness, fatigue, dry mouth- , a symptom that can be attributed to snoring and likely OSA.   I plan to follow up either personally or through our NP within 2-4 month.   CC: I will share my notes with PCP.  Electronically signed by: Larey Seat, MD 08/04/2020 9:01 AM  [facility] Board certified by The [employer] of Sleep Medicine and Diplomate of the [employer] of Sleep Medicine. Board certified In Neurology through the [place], Fellow of the [employer] of Neurology. Medical Director  of [facility].

## 2020-08-04 NOTE — Patient Instructions (Signed)
Hypersomnia Hypersomnia is a condition in which a person feels very tired during the day even though he or she gets plenty of sleep at night. A person with this condition may take naps during the day and may find it very difficult to wake up from sleep. Hypersomnia may affect a person's ability to think, concentrate,drive, or remember things. What are the causes? The cause of this condition may not be known. Possible causes include: Certain medicines. Sleep disorders, such as narcolepsy and sleep apnea. Injury to the head, brain, or spinal cord. Drug or alcohol use. Gastroesophageal reflux disease (GERD). Tumors. Certain medical conditions, such as depression, diabetes, or an underactive thyroid gland (hypothyroidism). What are the signs or symptoms? The main symptoms of hypersomnia include: Feeling very tired throughout the day, regardless of how much sleep you got the night before. Having trouble waking up. Others may find it difficult to wake you up when you are sleeping. Sleeping for longer and longer periods at a time. Taking naps throughout the day. Other symptoms may include: Feeling restless, anxious, or annoyed. Lacking energy. Having trouble with: Remembering. Speaking. Thinking. Loss of appetite. Seeing, hearing, tasting, smelling, or feeling things that are not real (hallucinations). How is this diagnosed? This condition may be diagnosed based on: Your symptoms and medical history. Your sleeping habits. Your health care provider may ask you to write down your sleeping habits in a daily sleep log, along with any symptoms you have. A series of tests that are done while you sleep (sleep study or polysomnogram). A test that measures how quickly you can fall asleep during the day (daytime nap study or multiple sleep latency test). How is this treated? Treatment can help you manage your condition. Treatment may include: Following a regular sleep routine. Lifestyle changes,  such as changing your eating habits, getting regular exercise, and avoiding alcohol or caffeinated beverages. Taking medicines to make you more alert (stimulants) during the day. Treating any underlying medical causes of hypersomnia. Follow these instructions at home: Sleep routine  Schedule the same bedtime and wake-up time each day. Practice a relaxing bedtime routine. This may include reading, meditation, deep breathing, or taking a warm bath before going to sleep. Get regular exercise each day. Avoid strenuous exercise in the evening hours. Keep your sleep environment at a cooler temperature, darkened, and quiet. Sleep with pillows and a mattress that are comfortable and supportive. Schedule short 20-minute naps for when you feel sleepiest during the day. Talk with your employer or teachers about your hypersomnia. If possible, adjust your schedule so that: You have a regular daytime work schedule. You can take a scheduled nap during the day. You do not have to work or be active at night. Do not eat a heavy meal for a few hours before bedtime. Eat your meals at about the same times every day. Avoid drinking alcohol or caffeinated beverages.  Safety  Do not drive or use heavy machinery if you are sleepy. Ask your health care provider if it is safe for you to drive. Wear a life jacket when swimming or spending time near water.  General instructions Take supplements and over-the-counter and prescription medicines only as told by your health care provider. Keep a sleep log that will help your doctor manage your condition. This may include information about: What time you go to bed each night. How often you wake up at night. How many hours you sleep at night. How often and for how long you nap during the   day. Any observations from others, such as leg movements during sleep, sleep walking, or snoring. Keep all follow-up visits as told by your health care provider. This is  important. Contact a health care provider if: You have new symptoms. Your symptoms get worse. Get help right away if: You have serious thoughts about hurting yourself or someone else. If you ever feel like you may hurt yourself or others, or have thoughts about taking your own life, get help right away. You can go to your nearest emergency department or call: Your local emergency services (911 in the [place] A suicide crisis helpline, such as the [facility] at 343 662 8038. This is open 24 hours a day. Summary Hypersomnia refers to a condition in which you feel very tired during the day even though you get plenty of sleep at night. A person with this condition may take naps during the day and may find it very difficult to wake up from sleep. Hypersomnia may affect a person's ability to think, concentrate, drive, or remember things. Treatment, such as following a regular sleep routine and making some lifestyle changes, can help you manage your condition. This information is not intended to replace advice given to you by your health care provider. Make sure you discuss any questions you have with your healthcare provider. Document Revised: 11/22/2019 Document Reviewed: 11/22/2019 Elsevier Patient Education  2022 [place] Sleep Information, Adult Quality sleep is important for your mental and physical health. It also improves your quality of life. Quality sleep means you: Are asleep for most of the time you are in bed. Fall asleep within 30 minutes. Wake up no more than once a night.  Are awake for no longer than 20 minutes if you do wake up during the night. Most adults need 7-8 hours of quality sleep each night. How can poor sleep affect me? If you do not get enough quality sleep, you may have: Mood swings. Daytime sleepiness. Confusion. Decreased reaction time. Sleep disorders, such as insomnia and sleep apnea. Difficulty with: Solving  problems. Coping with stress. Paying attention. These issues may affect your performance and productivity at work, school, and at home. Lack of sleep may also put you at higher risk for accidents, suicide,and risky behaviors. If you do not get quality sleep you may also be at higher risk for several health problems, including: Infections. Type 2 diabetes. Heart disease. High blood pressure. Obesity. Worsening of long-term conditions, like arthritis, kidney disease, depression, Parkinson's disease, and epilepsy. What actions can I take to get more quality sleep?     Stick to a sleep schedule. Go to sleep and wake up at about the same time each day. Do not try to sleep less on weekdays and make up for lost sleep on weekends. This does not work. Try to get about 30 minutes of exercise on most days. Do not exercise 2-3 hours before going to bed. Limit naps during the day to 30 minutes or less. Do not use any products that contain nicotine or tobacco, such as cigarettes or e-cigarettes. If you need help quitting, ask your health care provider. Do not drink caffeinated beverages for at least 8 hours before going to bed. Coffee, tea, and some sodas contain caffeine. Do not drink alcohol close to bedtime. Do not eat large meals close to bedtime. Do not take naps in the late afternoon. Try to get at least 30 minutes of sunlight every day. Morning sunlight is best. Make time to relax before  bed. Reading, listening to music, or taking a hot bath promotes quality sleep. Make your bedroom a place that promotes quality sleep. Keep your bedroom dark, quiet, and at a comfortable room temperature. Make sure your bed is comfortable. Take out sleep distractions like TV, a computer, smartphone, and bright lights. If you are lying awake in bed for longer than 20 minutes, get up and do a relaxing activity until you feel sleepy. Work with your health care provider to treat medical conditions that may affect  sleeping, such as: Nasal obstruction. Snoring. Sleep apnea and other sleep disorders. Talk to your health care provider if you think any of your prescription medicines may cause you to have difficulty falling or staying asleep. If you have sleep problems, talk with a sleep consultant. If you think you have a sleep disorder, talk with your health care provider about getting evaluated by a specialist. Where to find more information [place] website: https://sleepfoundation.org National Heart, Lung, and [place] ([place]): http://www.saunders.info/.pdf Centers for Disease Control and Prevention (CDC): LearningDermatology.pl Contact a health care provider if you: Have trouble getting to sleep or staying asleep. Often wake up very early in the morning and cannot get back to sleep. Have daytime sleepiness. Have daytime sleep attacks of suddenly falling asleep and sudden muscle weakness (narcolepsy). Have a tingling sensation in your legs with a strong urge to move your legs (restless legs syndrome). Stop breathing briefly during sleep (sleep apnea). Think you have a sleep disorder or are taking a medicine that is affecting your quality of sleep. Summary Most adults need 7-8 hours of quality sleep each night. Getting enough quality sleep is an important part of health and well-being. Make your bedroom a place that promotes quality sleep and avoid things that may cause you to have poor sleep, such as alcohol, caffeine, smoking, and large meals. Talk to your health care provider if you have trouble falling asleep or staying asleep. This information is not intended to replace advice given to you by your health care provider. Make sure you discuss any questions you have with your healthcare provider. Document Revised: 04/20/2017 Document Reviewed: 04/20/2017 Elsevier Patient Education  [place].

## 2020-08-05 DIAGNOSIS — Z20822 Contact with and (suspected) exposure to covid-19: Secondary | ICD-10-CM | POA: Diagnosis not present

## 2020-08-06 ENCOUNTER — Institutional Professional Consult (permissible substitution): Payer: Medicare Other | Admitting: Neurology

## 2020-08-11 ENCOUNTER — Telehealth: Payer: Self-pay

## 2020-08-11 NOTE — Telephone Encounter (Signed)
LVM for pt to call me back to schedule sleep study  

## 2020-08-12 DIAGNOSIS — Z20822 Contact with and (suspected) exposure to covid-19: Secondary | ICD-10-CM | POA: Diagnosis not present

## 2020-08-15 ENCOUNTER — Other Ambulatory Visit: Payer: Self-pay

## 2020-08-15 ENCOUNTER — Telehealth: Payer: Self-pay | Admitting: Family Medicine

## 2020-08-15 DIAGNOSIS — M545 Low back pain, unspecified: Secondary | ICD-10-CM

## 2020-08-15 DIAGNOSIS — G8929 Other chronic pain: Secondary | ICD-10-CM

## 2020-08-15 NOTE — Telephone Encounter (Signed)
Pt called and would like another epidural ordered to [facility] for her lower back.

## 2020-08-15 NOTE — Telephone Encounter (Signed)
Ordered. Patient notified. 

## 2020-08-18 ENCOUNTER — Other Ambulatory Visit: Payer: Self-pay

## 2020-08-18 ENCOUNTER — Ambulatory Visit
Admission: RE | Admit: 2020-08-18 | Discharge: 2020-08-18 | Disposition: A | Discharge status: Home / Self Care | Payer: Medicare Other | Source: Ambulatory Visit | Attending: Family Medicine | Admitting: Family Medicine

## 2020-08-18 DIAGNOSIS — M4126 Other idiopathic scoliosis, lumbar region: Secondary | ICD-10-CM | POA: Diagnosis not present

## 2020-08-18 DIAGNOSIS — M5116 Intervertebral disc disorders with radiculopathy, lumbar region: Secondary | ICD-10-CM | POA: Diagnosis not present

## 2020-08-18 DIAGNOSIS — G8929 Other chronic pain: Secondary | ICD-10-CM

## 2020-08-18 MED ORDER — METHYLPREDNISOLONE ACETATE 40 MG/ML INJ SUSP (RADIOLOG
80.0000 mg | Freq: Once | INTRAMUSCULAR | Status: AC
  Administered 2020-08-18: 80 mg via EPIDURAL

## 2020-08-18 MED ORDER — IOPAMIDOL (ISOVUE-M 200) INJECTION 41%
1.0000 mL | Freq: Once | INTRAMUSCULAR | Status: AC
  Administered 2020-08-18: 1 mL via EPIDURAL

## 2020-08-18 NOTE — Discharge Instructions (Signed)

## 2020-08-19 DIAGNOSIS — Z20822 Contact with and (suspected) exposure to covid-19: Secondary | ICD-10-CM | POA: Diagnosis not present

## 2020-08-20 NOTE — Progress Notes (Signed)
[facility] Phone: [phone] Subjective:   [name], am serving as a scribe for Dr. Hulan Saas.  This visit occurred during the SARS-CoV-2 public health emergency.  Safety protocols were in place, including screening questions prior to the visit, additional usage of staff PPE, and extensive cleaning of exam room while observing appropriate contact time as indicated for disinfecting solutions.    I'm seeing this patient by the request  of:  [name], MD  CC: Left ankle pain follow-up  [id number]  07/22/2020 Patient is doing much better at this time.  Patient's x-rays previously seem to be more on the left side but no tenderness on the left or the right at the moment.  Able to take deep breaths.  No change in management as long as patient continues to do well.  Discussed with patient if any shortness of breath or abdominal pain to seek medical attention or to call us immediately  Patient is does seem to be making some improvement overall.  He did have the lateral malleolus fracture and is unable to wear the brace.  Doing better with the compression sock.  Could consider formal physical therapy but patient would like to continue with the range of motion exercises at home and increase activity as tolerated.  Discussed worsening symptoms or any type of swelling in the calf and to seek medical attention immediately.  Follow-up with me again in 4 to 5 weeks.  Update 08/21/2020 Brenda A Waitkus is a 75 y.o. female coming in with complaint of R rib and L ankle pain. Patient states that her pain has improved but that she feels tightness in lateral malleolus. Has been using compression socks for past 3 weeks.  Patient states that the ankle has been doing better but still has tenderness over the lateral aspect.  Patient states that he still has intermittent swelling noted.  Patient states that the rib pain that was  completely resolved      Past Medical History:  Diagnosis Date   Arthritis    Bursitis    left elbow   Clostridium difficile infection    Diabetes mellitus without complication ([place])    Frequent falls    HLD (hyperlipidemia)    Hypertension    Infectious colitis    Past Surgical History:  Procedure Laterality Date   CATARACT EXTRACTION Bilateral    COLONOSCOPY     ORIF PROXIMAL TIBIAL PLATEAU FRACTURE Left 02/19/2012   knee   POLYPECTOMY     TONSILLECTOMY     Social History   Socioeconomic History   Marital status: Divorced    Spouse name: Not on file   Number of children: 2   Years of education: Not on file   Highest education level: Associate degree: academic program  Occupational History   Occupation: RETIRED  Tobacco Use   Smoking status: Former    Packs/day: 0.25    Years: 50.00    Pack years: 12.50    Types: Cigarettes    Quit date: 08/28/2014    Years since quitting: 5.9   Smokeless tobacco: Never   Tobacco comments:    1 pack every 3 days  Vaping Use   Vaping Use: Never used  Substance and Sexual Activity   Alcohol use: No    Alcohol/week: 0.0 standard drinks   Drug use: No   Sexual activity: Not Currently  Other Topics Concern   Not on file  Social History Narrative   Lives alone    Right handed   Caffeine: 3x weekly         Social Determinants of Health   Financial Resource Strain: Low Risk    Difficulty of Paying Living Expenses: Not hard at all  Food Insecurity: No Food Insecurity   Worried About Charity fundraiser in the Last Year: Never true   Arboriculturist in the Last Year: Never true  Transportation Needs: No Transportation Needs   Lack of Transportation (Medical): No   Lack of Transportation (Non-Medical): No  Physical Activity: Sufficiently Active   Days of Exercise per Week: 5 days   Minutes of Exercise per Session: 30 min  Stress: No Stress Concern Present   Feeling of Stress : Not at all  Social Connections:  Moderately Integrated   Frequency of Communication with Friends and Family: More than three times a week   Frequency of Social Gatherings with Friends and Family: More than three times a week   Attends Religious Services: More than 4 times per year   Active Member of [employer] or Organizations: Yes   Attends Archivist Meetings: More than 4 times per year   Marital Status: Widowed   Allergies  Allergen Reactions   Doxycycline Diarrhea   Minocycline Diarrhea   Wellbutrin [Bupropion] Swelling    Per pt her tongue was swollen and sore/symptoms stopped once the medication was discontinued.    Penicillins Rash    Has patient had a PCN reaction causing immediate rash, facial/tongue/throat swelling, SOB or lightheadedness with hypotension: yes Has patient had a PCN reaction causing severe rash involving mucus membranes or skin necrosis: no Has patient had a PCN reaction that required hospitalization: unknown Has patient had a PCN reaction occurring within the last 10 years: no If all of the above answers are "NO", then may proceed with Cephalosporin use.    Z-Pak [Azithromycin] Other (See Comments)    diarrhea   Family History  Problem Relation Age of Onset   Diverticulosis Mother    Ovarian cancer Mother    Pancreatic cancer Father    Lung cancer Father    Colon polyps Sister    Colon cancer Neg Hx     Current Outpatient Medications (Endocrine & Metabolic):    denosumab (PROLIA) 60 MG/ML SOSY injection, INJECT 60 MG INTO THE SKIN ONCE FOR 1 DOSE (Patient taking differently: Inject 60 mg into the skin every 6 (six) months.)  Current Outpatient Medications (Cardiovascular):    amLODipine (NORVASC) 5 MG tablet, Take 1.5 tablets (7.5 mg total) by mouth daily.   hydrochlorothiazide (HYDRODIURIL) 25 MG tablet, TAKE 1 TABLET(25 MG) BY MOUTH EVERY MORNING   lisinopril (ZESTRIL) 40 MG tablet, TAKE 1 TABLET(40 MG) BY MOUTH DAILY (Patient taking differently: Take 40 mg by mouth daily.)    atorvastatin (LIPITOR) 40 MG tablet, TAKE 1 TABLET(40 MG) BY MOUTH DAILY. FOLLOW-UP APPOINTMENT WITH LABS ARE DUE  Current Outpatient Medications (Respiratory):    Camphor-Eucalyptus-Menthol (VICKS VAPORUB EX), Apply 1 application topically as needed (congestion).   guaifenesin (ROBITUSSIN) 100 MG/5ML syrup, Take 200 mg by mouth 3 (three) times daily as needed for cough.  Current Outpatient Medications (Analgesics):    acetaminophen (TYLENOL) 500 MG tablet, Take 500 mg by mouth every 6 (six) hours as needed for mild pain, fever or headache.  Current Outpatient Medications (Hematological):    Cyanocobalamin (VITAMIN B-12) 2500 MCG SUBL, Place 2,500 mcg under the tongue daily.  Current Outpatient  Medications (Other):    ALPRAZolam (XANAX) 0.5 MG tablet, TAKE 1 TABLET(0.5 MG) BY MOUTH AT BEDTIME AS NEEDED FOR ANXIETY   cholecalciferol (VITAMIN D) 25 MCG (1000 UNIT) tablet, Take 2,000 Units by mouth daily.   gabapentin (NEURONTIN) 100 MG capsule, Take 2 capsules (200 mg total) by mouth at bedtime. (Patient taking differently: Take 200 mg by mouth at bedtime as needed (neuropathy).)   potassium chloride (KLOR-CON) 8 MEQ tablet, TAKE 2 TABLETS(16 MEQ) BY MOUTH DAILY (Patient taking differently: Take 16 mEq by mouth daily.)   TURMERIC PO, Take 1 tablet by mouth daily.   Reviewed prior external information including notes and imaging from  primary care provider As well as notes that were available from care everywhere and other healthcare systems.  Past medical history, social, surgical and family history all reviewed in electronic medical record.  No pertanent information unless stated regarding to the chief complaint.   Review of Systems:  No headache, visual changes, nausea, vomiting, diarrhea, constipation, dizziness, abdominal pain, skin rash, fevers, chills, night sweats, weight loss, swollen lymph nodes, body aches,  chest pain, shortness of breath, mood changes. POSITIVE muscle aches,  joint swelling  Objective  Blood pressure 124/82, pulse 88, height '5\' 5"'$  (1.651 m), weight 166 lb (75.3 kg), SpO2 98 %.   General: No apparent distress alert and oriented x3 mood and affect normal, dressed appropriately.  HEENT: Pupils equal, extraocular movements intact  Respiratory: Patient's speak in full sentences and does not appear short of breath  Cardiovascular: No lower extremity edema, non tender, no erythema  Gait antalgic using the aid of a cane Left ankle exam shows the patient does have tenderness to palpation over the ATFL area.  Mild loosening and appears on the lateral aspect.  Tightness noted of the medial aspect.  Lacks 5 degrees in all planes with range of motion   Limited muscular skeletal ultrasound was performed and interpreted by Hulan Saas, M   Limited ultrasound of patient's left ankle have difficulty seeing the ATFL at this time.  Hypoechoic changes minorly over the lateral aspect of the joint space.  Patient does have what appears to be callus formation noted over the lateral malleolus distally.  Causing may be some mild impingement.  Mild increase in neovascularization in Doppler flow. Impression: Lateral ankle injury with mild arthritic changes Impression and Recommendations:     The above documentation has been reviewed and is accurate and complete Lyndal Pulley, DO

## 2020-08-21 ENCOUNTER — Other Ambulatory Visit: Payer: Self-pay | Admitting: Internal Medicine

## 2020-08-21 ENCOUNTER — Ambulatory Visit (INDEPENDENT_AMBULATORY_CARE_PROVIDER_SITE_OTHER): Payer: Medicare Other | Admitting: Family Medicine

## 2020-08-21 ENCOUNTER — Encounter: Payer: Self-pay | Admitting: Family Medicine

## 2020-08-21 ENCOUNTER — Telehealth: Payer: Self-pay

## 2020-08-21 ENCOUNTER — Ambulatory Visit: Payer: Self-pay

## 2020-08-21 ENCOUNTER — Other Ambulatory Visit: Payer: Self-pay

## 2020-08-21 VITALS — BP 124/82 | HR 88 | Ht 65.0 in | Wt 166.0 lb

## 2020-08-21 DIAGNOSIS — M25572 Pain in left ankle and joints of left foot: Secondary | ICD-10-CM

## 2020-08-21 DIAGNOSIS — S2231XD Fracture of one rib, right side, subsequent encounter for fracture with routine healing: Secondary | ICD-10-CM

## 2020-08-21 DIAGNOSIS — S99912A Unspecified injury of left ankle, initial encounter: Secondary | ICD-10-CM | POA: Diagnosis not present

## 2020-08-21 DIAGNOSIS — G8929 Other chronic pain: Secondary | ICD-10-CM | POA: Diagnosis not present

## 2020-08-21 NOTE — Assessment & Plan Note (Signed)
Patient still has findings that are consistent with advancing of L tear.  Patient does need some physical therapy to help with strengthening and range of motion of the ankle.  On ultrasound today very mild hypoechoic changes noted.  Follow-up again in 8 weeks weeks

## 2020-08-21 NOTE — Telephone Encounter (Signed)
Patient came in office wanting to know when she could receive her Prolia?   Message forwarded to Amy Y.

## 2020-08-21 NOTE — Telephone Encounter (Signed)
Spoke with patient today. 

## 2020-08-21 NOTE — Patient Instructions (Signed)
PT at [facility] See me gain in 2 months

## 2020-08-21 NOTE — Assessment & Plan Note (Signed)
Patient is nontender at this time.  Fully healed at the moment.

## 2020-08-26 DIAGNOSIS — Z20822 Contact with and (suspected) exposure to covid-19: Secondary | ICD-10-CM | POA: Diagnosis not present

## 2020-08-31 DIAGNOSIS — I700 Atherosclerosis of aorta: Secondary | ICD-10-CM | POA: Insufficient documentation

## 2020-08-31 NOTE — Assessment & Plan Note (Addendum)
Chronic Lab Results  Component Value Date   LDLCALC 83 03/03/2020   Currently on atorvastatin 40 mg qd Will check LDL today Stressed healthy diet, as much activity as tolerated Discussed possibly consider switching atorvastatin to Crestor, but she is doing well with the atorvastatin and would prefer not to switch at this time

## 2020-08-31 NOTE — Patient Instructions (Addendum)
  Blood work was ordered.     Medications changes include :   none  Your prescription(s) have been submitted to your pharmacy. Please take as directed and contact our office if you believe you are having problem(s) with the medication(s).   A CT scan of your chest and a bone density was ordered.   Someone from their office will call you to schedule an appointment.    Please followup in 6 months

## 2020-08-31 NOTE — Progress Notes (Signed)
Subjective:    Patient ID: Kristina Ayala, female    DOB: 20-Aug-1945, 75 y.o.   MRN: [id number]  HPI The patient is here for follow up of their chronic medical problems, including DM, htn, hichol, anxiety,OP   Lung nodule on CT from 05/2018 - needs 18-24 month follow up  Last prolia injection was 07/2019.  Dexa due.  She does want to continue the Prolia shots.  She is taking her medication as prescribed.  Still dealing with chronic back pain    Medications and allergies reviewed with patient and updated if appropriate.  Patient Active Problem List   Diagnosis Date Noted   Aortic atherosclerosis ([place]) 08/31/2020   Right rib fracture 07/10/2020   Left ankle injury 07/10/2020   Closed low lateral malleolus fracture 07/10/2020   Snoring 05/23/2020   Cough 12/04/2019   Pancreatic mass 11/08/2019   DOE (dyspnea on exertion) 03/20/2019   Chronic left SI joint pain 03/13/2019   Leg cramping 01/10/2019   Osteoarthritis 01/10/2019   Right lower quadrant abdominal pain 11/09/2018   Adnexal mass, right 11/09/2018   Lung nodules 06/19/2018   Abnormal findings on diagnostic imaging of lung 06/08/2018   Thyromegaly 10/28/2017   Restless leg syndrome 10/12/2017   Greater trochanteric bursitis of left hip 07/26/2017   Hypokalemia 04/18/2017   Acute bronchitis 12/28/2016   Degenerative arthritis of left knee 12/01/2016   Anxiety 12/01/2016   Diabetes mellitus without complication ([place]) [id number]   Bursitis of right shoulder 09/04/2015   Lateral meniscus derangement 06/03/2015   Tear of LCL (lateral collateral ligament) of knee 06/03/2015   Lumbar radiculopathy 09/19/2014   Low back pain 01/04/2014   Bilateral knee pain 01/04/2014   Diverticulosis of colon without hemorrhage 07/26/2013   Trigger thumb of left hand 03/06/2013   Essential hypertension, benign 02/15/2013   Hyperlipidemia 09/28/2012   COPD (chronic obstructive pulmonary disease) ([place]) 05/22/2012    Osteoporosis 05/22/2012    Current Outpatient Medications on File Prior to Visit  Medication Sig Dispense Refill   acetaminophen (TYLENOL) 500 MG tablet Take 500 mg by mouth every 6 (six) hours as needed for mild pain, fever or headache.     ALPRAZolam (XANAX) 0.5 MG tablet TAKE 1 TABLET(0.5 MG) BY MOUTH AT BEDTIME AS NEEDED FOR ANXIETY 30 tablet 2   amLODipine (NORVASC) 5 MG tablet Take 1.5 tablets (7.5 mg total) by mouth daily. 135 tablet 1   atorvastatin (LIPITOR) 40 MG tablet TAKE 1 TABLET(40 MG) BY MOUTH DAILY. FOLLOW-UP APPOINTMENT WITH LABS ARE DUE 30 tablet 2   Camphor-Eucalyptus-Menthol (VICKS VAPORUB EX) Apply 1 application topically as needed (congestion).     cholecalciferol (VITAMIN D) 25 MCG (1000 UNIT) tablet Take 2,000 Units by mouth daily.     Cyanocobalamin (VITAMIN B-12) 2500 MCG SUBL Place 2,500 mcg under the tongue daily.     denosumab (PROLIA) 60 MG/ML SOSY injection INJECT 60 MG INTO THE SKIN ONCE FOR 1 DOSE (Patient taking differently: Inject 60 mg into the skin every 6 (six) months.) 1 mL 0   gabapentin (NEURONTIN) 100 MG capsule Take 2 capsules (200 mg total) by mouth at bedtime. (Patient taking differently: Take 200 mg by mouth at bedtime as needed (neuropathy).) 180 capsule 0   guaifenesin (ROBITUSSIN) 100 MG/5ML syrup Take 200 mg by mouth 3 (three) times daily as needed for cough.     hydrochlorothiazide (HYDRODIURIL) 25 MG tablet TAKE 1 TABLET(25 MG) BY MOUTH EVERY MORNING 90 tablet 3   lisinopril (ZESTRIL)  40 MG tablet TAKE 1 TABLET(40 MG) BY MOUTH DAILY (Patient taking differently: Take 40 mg by mouth daily.) 90 tablet 1   potassium chloride (KLOR-CON) 8 MEQ tablet TAKE 2 TABLETS(16 MEQ) BY MOUTH DAILY (Patient taking differently: Take 16 mEq by mouth daily.) 180 tablet 1   TURMERIC PO Take 1 tablet by mouth daily.     No current facility-administered medications on file prior to visit.    Past Medical History:  Diagnosis Date   Arthritis    Bursitis    left  elbow   Clostridium difficile infection    Diabetes mellitus without complication (HCC)    Frequent falls    HLD (hyperlipidemia)    Hypertension    Infectious colitis     Past Surgical History:  Procedure Laterality Date   CATARACT EXTRACTION Bilateral    COLONOSCOPY     ORIF PROXIMAL TIBIAL PLATEAU FRACTURE Left 02/19/2012   knee   POLYPECTOMY     TONSILLECTOMY      Social History   Socioeconomic History   Marital status: Divorced    Spouse name: Not on file   Number of children: 2   Years of education: Not on file   Highest education level: Associate degree: academic program  Occupational History   Occupation: RETIRED  Tobacco Use   Smoking status: Former    Packs/day: 0.25    Years: 50.00    Pack years: 12.50    Types: Cigarettes    Quit date: 08/28/2014    Years since quitting: 6.0   Smokeless tobacco: Never   Tobacco comments:    1 pack every 3 days  Vaping Use   Vaping Use: Never used  Substance and Sexual Activity   Alcohol use: No    Alcohol/week: 0.0 standard drinks   Drug use: No   Sexual activity: Not Currently  Other Topics Concern   Not on file  Social History Narrative   Lives alone    Right handed   Caffeine: 3x weekly         Social Determinants of Health   Financial Resource Strain: Low Risk    Difficulty of Paying Living Expenses: Not hard at all  Food Insecurity: No Food Insecurity   Worried About Charity fundraiser in the Last Year: Never true   Ran Out of Food in the Last Year: Never true  Transportation Needs: No Transportation Needs   Lack of Transportation (Medical): No   Lack of Transportation (Non-Medical): No  Physical Activity: Sufficiently Active   Days of Exercise per Week: 5 days   Minutes of Exercise per Session: 30 min  Stress: No Stress Concern Present   Feeling of Stress : Not at all  Social Connections: Moderately Integrated   Frequency of Communication with Friends and Family: More than three times a week    Frequency of Social Gatherings with Friends and Family: More than three times a week   Attends Religious Services: More than 4 times per year   Active Member of [employer] or Organizations: Yes   Attends Archivist Meetings: More than 4 times per year   Marital Status: Widowed    Family History  Problem Relation Age of Onset   Diverticulosis Mother    Ovarian cancer Mother    Pancreatic cancer Father    Lung cancer Father    Colon polyps Sister    Colon cancer Neg Hx     Review of Systems  Constitutional:  Negative for chills  and fever.  Respiratory:  Negative for cough, shortness of breath and wheezing.   Cardiovascular:  Negative for chest pain, palpitations and leg swelling.  Neurological:  Negative for light-headedness and headaches.      Objective:   Vitals:   09/01/20 0930  BP: 126/80  Pulse: 87  Temp: 98.6 F (37 C)  SpO2: 98%   BP Readings from Last 3 Encounters:  09/01/20 126/80  08/21/20 124/82  08/18/20 131/74   Wt Readings from Last 3 Encounters:  09/01/20 165 lb (74.8 kg)  08/21/20 166 lb (75.3 kg)  08/04/20 166 lb (75.3 kg)   Body mass index is 27.46 kg/m.   Physical Exam    Constitutional: Appears well-developed and well-nourished. No distress.  HENT:  Head: Normocephalic and atraumatic.  Neck: Neck supple. No tracheal deviation present. No thyromegaly present.  No cervical lymphadenopathy Cardiovascular: Normal rate, regular rhythm and normal heart sounds.   No murmur heard. No carotid bruit .  No edema Pulmonary/Chest: Effort normal and breath sounds normal. No respiratory distress. No has no wheezes. No rales.  Skin: Skin is warm and dry. Not diaphoretic.  Psychiatric: Normal mood and affect. Behavior is normal.      Assessment & Plan:    See Problem List for Assessment and Plan of chronic medical problems.    This visit occurred during the SARS-CoV-2 public health emergency.  Safety protocols were in place, including  screening questions prior to the visit, additional usage of staff PPE, and extensive cleaning of exam room while observing appropriate contact time as indicated for disinfecting solutions.

## 2020-09-01 ENCOUNTER — Encounter: Payer: Self-pay | Admitting: Internal Medicine

## 2020-09-01 ENCOUNTER — Ambulatory Visit (INDEPENDENT_AMBULATORY_CARE_PROVIDER_SITE_OTHER)
Admission: RE | Admit: 2020-09-01 | Discharge: 2020-09-01 | Disposition: A | Discharge status: Home / Self Care | Payer: Medicare Other | Source: Ambulatory Visit | Attending: Internal Medicine | Admitting: Internal Medicine

## 2020-09-01 ENCOUNTER — Ambulatory Visit (INDEPENDENT_AMBULATORY_CARE_PROVIDER_SITE_OTHER): Payer: Medicare Other | Admitting: Internal Medicine

## 2020-09-01 ENCOUNTER — Other Ambulatory Visit: Payer: Self-pay

## 2020-09-01 VITALS — BP 126/80 | HR 87 | Temp 98.6°F | Ht 65.0 in | Wt 165.0 lb

## 2020-09-01 DIAGNOSIS — M810 Age-related osteoporosis without current pathological fracture: Secondary | ICD-10-CM | POA: Diagnosis not present

## 2020-09-01 DIAGNOSIS — E782 Mixed hyperlipidemia: Secondary | ICD-10-CM | POA: Diagnosis not present

## 2020-09-01 DIAGNOSIS — E119 Type 2 diabetes mellitus without complications: Secondary | ICD-10-CM

## 2020-09-01 DIAGNOSIS — F419 Anxiety disorder, unspecified: Secondary | ICD-10-CM | POA: Diagnosis not present

## 2020-09-01 DIAGNOSIS — I10 Essential (primary) hypertension: Secondary | ICD-10-CM | POA: Diagnosis not present

## 2020-09-01 DIAGNOSIS — I700 Atherosclerosis of aorta: Secondary | ICD-10-CM | POA: Diagnosis not present

## 2020-09-01 DIAGNOSIS — R918 Other nonspecific abnormal finding of lung field: Secondary | ICD-10-CM

## 2020-09-01 DIAGNOSIS — E876 Hypokalemia: Secondary | ICD-10-CM | POA: Diagnosis not present

## 2020-09-01 LAB — LIPID PANEL
Cholesterol: 173 mg/dL (ref 0–200)
HDL: 51.6 mg/dL
LDL Cholesterol: 102 mg/dL — ABNORMAL HIGH (ref 0–99)
NonHDL: 121.25
Total CHOL/HDL Ratio: 3
Triglycerides: 95 mg/dL (ref 0.0–149.0)
VLDL: 19 mg/dL (ref 0.0–40.0)

## 2020-09-01 LAB — COMPREHENSIVE METABOLIC PANEL WITH GFR
ALT: 28 U/L (ref 0–35)
AST: 19 U/L (ref 0–37)
Albumin: 4.3 g/dL (ref 3.5–5.2)
Alkaline Phosphatase: 102 U/L (ref 39–117)
BUN: 14 mg/dL (ref 6–23)
CO2: 30 meq/L (ref 19–32)
Calcium: 9.7 mg/dL (ref 8.4–10.5)
Chloride: 103 meq/L (ref 96–112)
Creatinine, Ser: 0.76 mg/dL (ref 0.40–1.20)
GFR: 76.09 mL/min
Glucose, Bld: 99 mg/dL (ref 70–99)
Potassium: 4.1 meq/L (ref 3.5–5.1)
Sodium: 141 meq/L (ref 135–145)
Total Bilirubin: 0.4 mg/dL (ref 0.2–1.2)
Total Protein: 7.2 g/dL (ref 6.0–8.3)

## 2020-09-01 LAB — VITAMIN D 25 HYDROXY (VIT D DEFICIENCY, FRACTURES): VITD: 61.3 ng/mL (ref 30.00–100.00)

## 2020-09-01 LAB — HEMOGLOBIN A1C: Hgb A1c MFr Bld: 6.1 % (ref 4.6–6.5)

## 2020-09-01 NOTE — Assessment & Plan Note (Signed)
Chronic BP well controlled Continue amlodipine 7.5 mg daily, HCTZ 25 mg daily, lisinopril 40 mg daily cmp

## 2020-09-01 NOTE — Assessment & Plan Note (Signed)
Chronic Sugars have been controlled with diet Check A1c today

## 2020-09-01 NOTE — Assessment & Plan Note (Addendum)
Chronic Has several lung nodules bilaterally She does have a smoking history so is not low risk Will order CT of the chest without contrast to follow-up-if stable then most likely no further follow-up is necessary

## 2020-09-01 NOTE — Assessment & Plan Note (Signed)
Chronic Check lipid panel  Continue atorvastatin 40 mg daily Regular exercise and healthy diet encouraged  

## 2020-09-01 NOTE — Assessment & Plan Note (Signed)
Chronic Controlled, stable Continue xanax 0.5 mg bid prn - does not take too often

## 2020-09-01 NOTE — Assessment & Plan Note (Signed)
Chronic Overdue for her Prolia shot-we will arrange that to be done as soon as possible Encourage as much activity as possible-she is limited by her chronic back pain Continue vitamin D supplementation-we will check vitamin D level DEXA due-ordered

## 2020-09-01 NOTE — Assessment & Plan Note (Signed)
Chronic Likely related to hydrochlorothiazide CMP Continue potassium chloride 18 mEq daily

## 2020-09-03 DIAGNOSIS — H524 Presbyopia: Secondary | ICD-10-CM | POA: Diagnosis not present

## 2020-09-03 DIAGNOSIS — E119 Type 2 diabetes mellitus without complications: Secondary | ICD-10-CM | POA: Diagnosis not present

## 2020-09-03 DIAGNOSIS — H43813 Vitreous degeneration, bilateral: Secondary | ICD-10-CM | POA: Diagnosis not present

## 2020-09-03 DIAGNOSIS — Z20822 Contact with and (suspected) exposure to covid-19: Secondary | ICD-10-CM | POA: Diagnosis not present

## 2020-09-03 DIAGNOSIS — H04123 Dry eye syndrome of bilateral lacrimal glands: Secondary | ICD-10-CM | POA: Diagnosis not present

## 2020-09-03 LAB — HM DIABETES EYE EXAM

## 2020-09-04 ENCOUNTER — Encounter: Payer: Self-pay | Admitting: Rehabilitative and Restorative Service Providers"

## 2020-09-04 ENCOUNTER — Ambulatory Visit: Payer: Medicare Other | Attending: Family Medicine | Admitting: Rehabilitative and Restorative Service Providers"

## 2020-09-04 ENCOUNTER — Other Ambulatory Visit: Payer: Self-pay

## 2020-09-04 DIAGNOSIS — M6281 Muscle weakness (generalized): Secondary | ICD-10-CM | POA: Diagnosis not present

## 2020-09-04 DIAGNOSIS — M5442 Lumbago with sciatica, left side: Secondary | ICD-10-CM

## 2020-09-04 DIAGNOSIS — R262 Difficulty in walking, not elsewhere classified: Secondary | ICD-10-CM

## 2020-09-04 DIAGNOSIS — R252 Cramp and spasm: Secondary | ICD-10-CM | POA: Diagnosis not present

## 2020-09-04 DIAGNOSIS — M25572 Pain in left ankle and joints of left foot: Secondary | ICD-10-CM

## 2020-09-04 DIAGNOSIS — R2689 Other abnormalities of gait and mobility: Secondary | ICD-10-CM

## 2020-09-04 NOTE — Patient Instructions (Signed)
Access Code: [email or web address] URL: [email or web address] Date: 09/04/2020 Prepared by: Shelby Dubin Emery Binz  Exercises Seated Heel Toe Raises - 2 x daily - 7 x weekly - 3 sets - 10 reps Seated Ankle Circles - 2 x daily - 7 x weekly - 2 sets - 10 reps Seated Ankle Alphabet - 2 x daily - 7 x weekly - 1 sets - 10 reps Standing Heel Raise with Support - 2 x daily - 7 x weekly - 2 sets - 10 reps Standing Toe Raises at Chair - 2 x daily - 7 x weekly - 2 sets - 10 reps Supine Hamstring Stretch - 2 x daily - 7 x weekly - 1 sets - 5 reps - 10-15 sec hold

## 2020-09-04 NOTE — Therapy (Signed)
[facility] [place], [place], [address] Phone: [phone]   Fax:  [phone]  Physical Therapy Evaluation  Patient Details  Name: Kristina Ayala MRN: [id number] Date of Birth: 1945/08/01 Referring Provider (PT): Dr Hulan Saas   Encounter Date: 09/04/2020   PT End of Session - 09/04/20 0934     Visit Number 1    Date for PT Re-Evaluation 10/24/20    PT Start Time 0845    PT Stop Time 0925    PT Time Calculation (min) 40 min    Activity Tolerance Patient tolerated treatment well    Behavior During Therapy [facility] for tasks assessed/performed             Past Medical History:  Diagnosis Date   Arthritis    Bursitis    left elbow   Clostridium difficile infection    Diabetes mellitus without complication ([place])    Frequent falls    HLD (hyperlipidemia)    Hypertension    Infectious colitis     Past Surgical History:  Procedure Laterality Date   CATARACT EXTRACTION Bilateral    COLONOSCOPY     ORIF PROXIMAL TIBIAL PLATEAU FRACTURE Left 02/19/2012   knee   POLYPECTOMY     TONSILLECTOMY      There were no vitals filed for this visit.    Subjective Assessment - 09/04/20 0851     Subjective Pt reports in June 2022, she fell in a bathroom stall and nearly fractured her L ankle and bruised ribs, additionally, her back pain that she had previously resurfaced.  She has been wearing compression sock until last visit with MD.  Pt reports that Dr Tamala Julian told her that she could stop wearing the ankle compression brace and begin PT.  Ms Beilstein states that she keeps a cane in her car because she is having difficulty maintaining her balance.    Pertinent History HTN    Limitations Standing;Walking;House hold activities    Diagnostic tests x-ray, ultrasound of L ankle    Patient Stated Goals Pt would like to have less pain to be able to negotiate steps and return to gardening.    Currently in Pain? Yes     Pain Score 9     Pain Location Ankle    Pain Orientation Left    Pain Descriptors / Indicators Sharp    Pain Type Acute pain    Pain Onset More than a month ago    Pain Frequency Intermittent    Pain Score 6    Pain Location Back    Pain Orientation Lower    Pain Descriptors / Indicators Aching    Pain Type Chronic pain    Pain Onset More than a month ago    Pain Frequency Constant                [facility] PT Assessment - 09/04/20 0001       Assessment   Medical Diagnosis L ankle pain and LBP    Referring Provider (PT) Dr Hulan Saas    Onset Date/Surgical Date 06/29/20    Hand Dominance Right    Prior Therapy PT for LBP      Precautions   Precautions Fall      Restrictions   Weight Bearing Restrictions No      Balance Screen   Has the patient fallen in the past 6 months Yes    How many times? 1    Has  the patient had a decrease in activity level because of a fear of falling?  No    Is the patient reluctant to leave their home because of a fear of falling?  No      Home Environment   Additional Comments has stairs but has a lift but is currently not working, does some housework      Prior Function   Level of Independence Independent    Vocation Retired    Visual merchandiser, gardening      Cognition   Overall Cognitive Status Within Functional Limits for tasks assessed      Observation/Other Assessments   Focus on Therapeutic Outcomes (FOTO)  63%      Sensation   Light Touch Appears Intact      AROM   Overall AROM Comments Lumbar ROM 50% limited but not painful    AROM Assessment Site Ankle    Right/Left Ankle Left;Right    Right Ankle Dorsiflexion 7    Right Ankle Plantar Flexion 69    Right Ankle Inversion 45    Right Ankle Eversion 35    Left Ankle Dorsiflexion 7    Left Ankle Plantar Flexion 45    Left Ankle Inversion 45    Left Ankle Eversion 33      Strength   Overall Strength Comments LLE 4-/5, RLE 4 to 4+/5      Flexibility    Soft Tissue Assessment /Muscle Length yes    Hamstrings tight   tight on LLE   Piriformis tight      Berg Balance Test   Sit to Stand Able to stand  independently using hands    Standing Unsupported Able to stand 2 minutes with supervision    Sitting with Back Unsupported but Feet Supported on Floor or Stool Able to sit safely and securely 2 minutes    Stand to Sit Controls descent by using hands    Transfers Able to transfer safely, definite need of hands    Standing Unsupported with Eyes Closed Able to stand 10 seconds with supervision    Standing Unsupported with Feet Together Able to place feet together independently and stand for 1 minute with supervision    From Standing, Reach Forward with Outstretched Arm Can reach forward >5 cm safely (2")    From Standing Position, Pick up Object from Floor Unable to pick up shoe, but reaches 2-5 cm (1-2") from shoe and balances independently    From Standing Position, Turn to Look Behind Over each Shoulder Needs supervision when turning    Turn 360 Degrees Able to turn 360 degrees safely but slowly    Standing Unsupported, Alternately Place Feet on Step/Stool Able to complete >2 steps/needs minimal assist    Standing Unsupported, One Foot in Front Able to take small step independently and hold 30 seconds    Standing on One Leg Tries to lift leg/unable to hold 3 seconds but remains standing independently    Total Score 33                        Objective measurements completed on examination: See above findings.       [facility] Adult PT Treatment/Exercise - 09/04/20 0001       Exercises   Exercises Knee/Hip;Ankle                    PT Education - 09/04/20 0924     Education Details Pt provided  HEP.    Person(s) Educated Patient    Methods Explanation;Demonstration;Handout    Comprehension Verbalized understanding;Returned demonstration              PT Short Term Goals - 09/04/20 0947       PT SHORT  TERM GOAL #1   Title be independent in initial HEP    Time 2    Period Weeks    Status New               PT Long Term Goals - 09/04/20 0947       PT LONG TERM GOAL #1   Title be independent in advanced HEP    Time 8    Period Weeks    Status New      PT LONG TERM GOAL #2   Title Pt will increase L ankle ROM to [facility] to allow her to negotiate steps.    Time 8    Period Weeks    Status New      PT LONG TERM GOAL #3   Title increase Berg balance test to 48/56 to place her at a low risk of falling.    Time 8    Period Weeks    Status New      PT LONG TERM GOAL #4   Title Patient will report at least a 60% improvement in symptoms with ADLs and IADLs.    Time 8    Period Weeks    Status New      PT LONG TERM GOAL #5   Title Pt will increase LLE strength to at least 4+/5 to allow her to more easily negotiate steps.    Time 8    Period Weeks    Status New                    Plan - 09/04/20 0940     Clinical Impression Statement Patient is a 75 y.o female who presents to outpatient PT with a referral from Dr Hulan Saas s/p a fall on 06/29/20 in a bathroom stall where she injured her L ankle and bruised her ribs.  Additionally, she has had increased back pain since the fall.  Ms Higham presents with decresaed L ankle ROM, specifically plantar flexion, incrased muscle tightness of hamstrings, piriformis and calves.  She has LLE weakness noted with strength of grossly 4-/5.  She had one self corrected loss of balance noted during PT evaluation and she reports that she often has decreased balance and feels like she will fall.  Ms Schuth would benefit from skilled PT to address her functional impairments to allow her to be safer and more independent in her home and community.    Personal Factors and Comorbidities Age;Past/Current Experience;Comorbidity 2    Comorbidities HTN, osteoporosis    Examination-Activity Limitations Bend;Lift;Stand;Stairs;Squat;Locomotion  Level;Transfers    Examination-Participation Restrictions Community Activity;Interpersonal Relationship    Stability/Clinical Decision Making Evolving/Moderate complexity    Clinical Decision Making Moderate    Rehab Potential Good    PT Frequency 2x / week    PT Duration 8 weeks    PT Treatment/Interventions ADLs/Self Care Home Management;Cryotherapy;Electrical Stimulation;Gait training;Neuromuscular re-education;Balance training;Therapeutic exercise;Therapeutic activities;Moist Heat;Stair training;Functional mobility training;Patient/family education;Manual techniques;Iontophoresis '4mg'$ /ml Dexamethasone;Ultrasound;Passive range of motion;Taping;Traction;Dry needling;Joint Manipulations;Spinal Manipulations    PT Next Visit Plan assess HEP, strengthening, flexibility, modalities/manual as indicated    PT Home Exercise Plan Access Code: [email or web address]    Consulted and Agree with Plan of Care Patient  Patient will benefit from skilled therapeutic intervention in order to improve the following deficits and impairments:  Abnormal gait, Decreased range of motion, Difficulty walking, Increased muscle spasms, Decreased activity tolerance, Pain, Decreased balance, Impaired flexibility, Improper body mechanics, Decreased mobility, Decreased strength  Visit Diagnosis: Muscle weakness (generalized) - Plan: PT plan of care cert/re-cert  Pain in left ankle and joints of left foot - Plan: PT plan of care cert/re-cert  Balance problem - Plan: PT plan of care cert/re-cert  Acute left-sided low back pain with left-sided sciatica - Plan: PT plan of care cert/re-cert  Cramp and spasm - Plan: PT plan of care cert/re-cert  Difficulty in walking, not elsewhere classified - Plan: PT plan of care cert/re-cert     Problem List Patient Active Problem List   Diagnosis Date Noted   Aortic atherosclerosis ([place]) 08/31/2020   Right rib fracture 07/10/2020   Left ankle injury 07/10/2020   Closed low  lateral malleolus fracture 07/10/2020   Snoring 05/23/2020   Cough 12/04/2019   Pancreatic mass 11/08/2019   DOE (dyspnea on exertion) 03/20/2019   Chronic left SI joint pain 03/13/2019   Leg cramping 01/10/2019   Osteoarthritis 01/10/2019   Right lower quadrant abdominal pain 11/09/2018   Adnexal mass, right 11/09/2018   Lung nodules 06/19/2018   Abnormal findings on diagnostic imaging of lung 06/08/2018   Thyromegaly 10/28/2017   Restless leg syndrome 10/12/2017   Greater trochanteric bursitis of left hip 07/26/2017   Hypokalemia 04/18/2017   Acute bronchitis 12/28/2016   Degenerative arthritis of left knee 12/01/2016   Anxiety 12/01/2016   Diabetes mellitus without complication ([place]) A999333   Bursitis of right shoulder 09/04/2015   Lateral meniscus derangement 06/03/2015   Tear of LCL (lateral collateral ligament) of knee 06/03/2015   Lumbar radiculopathy 09/19/2014   Low back pain 01/04/2014   Bilateral knee pain 01/04/2014   Diverticulosis of colon without hemorrhage 07/26/2013   Trigger thumb of left hand 03/06/2013   Essential hypertension, benign 02/15/2013   Hyperlipidemia 09/28/2012   COPD (chronic obstructive pulmonary disease) ([place]) 05/22/2012   Osteoporosis 05/22/2012    Juel Burrow, PT, DPT 09/04/2020, 10:02 AM  [facility]. [place], [place], [address] Phone: [phone]   Fax:  [phone]  Name: Kristina Ayala MRN: [id number] Date of Birth: Jul 06, 1945

## 2020-09-05 ENCOUNTER — Encounter: Payer: Self-pay | Admitting: Internal Medicine

## 2020-09-05 NOTE — Progress Notes (Unsigned)
Outside notes received. Information abstracted. Notes sent to scan.  

## 2020-09-08 ENCOUNTER — Other Ambulatory Visit: Payer: Self-pay

## 2020-09-08 ENCOUNTER — Ambulatory Visit (INDEPENDENT_AMBULATORY_CARE_PROVIDER_SITE_OTHER)
Admission: RE | Admit: 2020-09-08 | Discharge: 2020-09-08 | Disposition: A | Discharge status: Home / Self Care | Payer: Medicare Other | Source: Ambulatory Visit | Attending: Internal Medicine | Admitting: Internal Medicine

## 2020-09-08 DIAGNOSIS — R918 Other nonspecific abnormal finding of lung field: Secondary | ICD-10-CM | POA: Diagnosis not present

## 2020-09-08 DIAGNOSIS — R911 Solitary pulmonary nodule: Secondary | ICD-10-CM | POA: Diagnosis not present

## 2020-09-08 DIAGNOSIS — I700 Atherosclerosis of aorta: Secondary | ICD-10-CM | POA: Diagnosis not present

## 2020-09-09 ENCOUNTER — Encounter: Payer: Self-pay | Admitting: Internal Medicine

## 2020-09-09 DIAGNOSIS — I2510 Atherosclerotic heart disease of native coronary artery without angina pectoris: Secondary | ICD-10-CM | POA: Insufficient documentation

## 2020-09-11 ENCOUNTER — Telehealth: Payer: Self-pay | Admitting: Internal Medicine

## 2020-09-11 ENCOUNTER — Ambulatory Visit: Payer: Medicare Other | Admitting: Rehabilitative and Restorative Service Providers"

## 2020-09-11 ENCOUNTER — Other Ambulatory Visit: Payer: Self-pay

## 2020-09-11 ENCOUNTER — Telehealth: Payer: Self-pay | Admitting: *Deleted

## 2020-09-11 ENCOUNTER — Encounter: Payer: Self-pay | Admitting: Rehabilitative and Restorative Service Providers"

## 2020-09-11 DIAGNOSIS — R262 Difficulty in walking, not elsewhere classified: Secondary | ICD-10-CM | POA: Diagnosis not present

## 2020-09-11 DIAGNOSIS — E782 Mixed hyperlipidemia: Secondary | ICD-10-CM

## 2020-09-11 DIAGNOSIS — R2689 Other abnormalities of gait and mobility: Secondary | ICD-10-CM | POA: Diagnosis not present

## 2020-09-11 DIAGNOSIS — M5442 Lumbago with sciatica, left side: Secondary | ICD-10-CM | POA: Diagnosis not present

## 2020-09-11 DIAGNOSIS — M25572 Pain in left ankle and joints of left foot: Secondary | ICD-10-CM | POA: Diagnosis not present

## 2020-09-11 DIAGNOSIS — M6281 Muscle weakness (generalized): Secondary | ICD-10-CM

## 2020-09-11 DIAGNOSIS — R252 Cramp and spasm: Secondary | ICD-10-CM

## 2020-09-11 NOTE — Telephone Encounter (Signed)
   Please call patient to discuss cholesterol medication. She is asking for a refill, however she has questions about the dosage  [place], [place]

## 2020-09-11 NOTE — Telephone Encounter (Signed)
Left VM for patient to pick up letter

## 2020-09-11 NOTE — Therapy (Signed)
[facility] [place], [place], [address] Phone: [phone]   Fax:  [phone]  Physical Therapy Treatment  Patient Details  Name: Kristina Ayala MRN: [id number] Date of Birth: 07/14/1945 Referring Provider (PT): Dr Kristina Ayala   Encounter Date: 09/11/2020   PT End of Session - 09/11/20 0845     Visit Number 2    Date for PT Re-Evaluation 10/24/20    PT Start Time 0839    PT Stop Time 0920    PT Time Calculation (min) 41 min    Activity Tolerance Patient tolerated treatment well    Behavior During Therapy Kristina Ayala for tasks assessed/performed             Past Medical History:  Diagnosis Date   Arthritis    Bursitis    left elbow   Clostridium difficile infection    Diabetes mellitus without complication ([place])    Frequent falls    HLD (hyperlipidemia)    Hypertension    Infectious colitis     Past Surgical History:  Procedure Laterality Date   CATARACT EXTRACTION Bilateral    COLONOSCOPY     ORIF PROXIMAL TIBIAL PLATEAU FRACTURE Left 02/19/2012   knee   POLYPECTOMY     TONSILLECTOMY      There were no vitals filed for this visit.   Subjective Assessment - 09/11/20 0843     Subjective I have not been doing all of my exercises as much as I should, but I do the ankle ABCs daily.    Patient Stated Goals Pt would like to have less pain to be able to negotiate steps and return to gardening.    Currently in Pain? Yes    Pain Score 4     Pain Location Ankle    Pain Orientation Left    Pain Descriptors / Indicators Dull    Pain Type Acute pain    Pain Score 6    Pain Location Back    Pain Orientation Lower    Pain Descriptors / Indicators Aching                               Kristina Ayala - 09/11/20 0001       Knee/Hip Exercises: Stretches   Gastroc Stretch Both;3 reps;20 seconds      Knee/Hip Exercises: Aerobic   Nustep L4 x6 min       Knee/Hip Exercises: Standing   Heel Raises Both;2 sets;10 reps    Other Standing Knee Exercises On Kristina Ayala:  ball toss, marching      Knee/Hip Exercises: Seated   Long Arc Quad Strengthening;Both;2 sets;10 reps    Long Arc Quad Weight 2 lbs.    Clamshell with TheraBand Yellow   2x10   Other Seated Knee/Hip Exercises Ankle yellow tband:  inversion, eversion, DF 2x10 each    Marching Strengthening;Both;2 sets;10 reps    Marching Weights 2 lbs.    Hamstring Curl Strengthening;Both;2 sets;10 reps    Hamstring Limitations yellow tband    Sit to Sand 10 reps;with UE support   standing on Kristina Ayala                     PT Short Term Goals - 09/11/20 1002       PT SHORT TERM GOAL #1   Title be independent in initial HEP    Status On-going  PT Long Term Goals - 09/04/20 0947       PT LONG TERM GOAL #1   Title be independent in advanced HEP    Time 8    Period Weeks    Status New      PT LONG TERM GOAL #2   Title Pt will increase L ankle ROM to Kristina Ayala to allow her to negotiate steps.    Time 8    Period Weeks    Status New      PT LONG TERM GOAL #3   Title increase Berg balance test to 48/56 to place her at a low risk of falling.    Time 8    Period Weeks    Status New      PT LONG TERM GOAL #4   Title Patient will report at least a 60% improvement in symptoms with ADLs and IADLs.    Time 8    Period Weeks    Status New      PT LONG TERM GOAL #5   Title Pt will increase LLE strength to at least 4+/5 to allow her to more easily negotiate steps.    Time 8    Period Weeks    Status New                   Plan - 09/11/20 0957     Clinical Impression Statement [name] tolerated session and ther ex well and without increased pain.  She did report muscle fatigue with exercise, but was able to complete all activities.  Pt required encouragement and SBA/CGA for standing on Kristina Ayala secondary to decreased balance and fear of falling.  She was  educated to perform HEP more regularly to assist her in meeting her goals, she verbalized her understanding.    PT Treatment/Interventions ADLs/Self Care Home Management;Cryotherapy;Electrical Stimulation;Gait training;Neuromuscular re-education;Balance training;Therapeutic exercise;Therapeutic activities;Moist Heat;Stair training;Functional mobility training;Patient/family education;Manual techniques;Iontophoresis '4mg'$ /ml Dexamethasone;Ultrasound;Passive range of motion;Taping;Traction;Dry needling;Joint Manipulations;Spinal Manipulations    PT Next Visit Plan assess HEP, strengthening, flexibility, modalities/manual as indicated    Consulted and Agree with Plan of Care Patient             Patient will benefit from skilled therapeutic intervention in order to improve the following deficits and impairments:  Abnormal gait, Decreased range of motion, Difficulty walking, Increased muscle spasms, Decreased activity tolerance, Pain, Decreased balance, Impaired flexibility, Improper body mechanics, Decreased mobility, Decreased strength  Visit Diagnosis: Muscle weakness (generalized)  Pain in left ankle and joints of left foot  Balance problem  Acute left-sided low back pain with left-sided sciatica  Cramp and spasm  Difficulty in walking, not elsewhere classified     Problem List Patient Active Problem List   Diagnosis Date Noted   Coronary artery calcification seen on CAT scan 09/09/2020   Aortic atherosclerosis ([place]) 08/31/2020   Right rib fracture 07/10/2020   Left ankle injury 07/10/2020   Closed low lateral malleolus fracture 07/10/2020   Snoring 05/23/2020   Cough 12/04/2019   Pancreatic mass 11/08/2019   DOE (dyspnea on exertion) 03/20/2019   Chronic left SI joint pain 03/13/2019   Leg cramping 01/10/2019   Osteoarthritis 01/10/2019   Right lower quadrant abdominal pain 11/09/2018   Adnexal mass, right 11/09/2018   Lung nodules - stable - no f/u needed 06/19/2018    Thyromegaly 10/28/2017   Restless leg syndrome 10/12/2017   Greater trochanteric bursitis of left hip 07/26/2017   Hypokalemia 04/18/2017   Acute bronchitis 12/28/2016  Degenerative arthritis of left knee 12/01/2016   Anxiety 12/01/2016   Diabetes mellitus without complication ([place]) [id number]   Bursitis of right shoulder 09/04/2015   Lateral meniscus derangement 06/03/2015   Tear of LCL (lateral collateral ligament) of knee 06/03/2015   Lumbar radiculopathy 09/19/2014   Low back pain 01/04/2014   Bilateral knee pain 01/04/2014   Diverticulosis of colon without hemorrhage 07/26/2013   Trigger thumb of left hand 03/06/2013   Essential hypertension, benign 02/15/2013   Hyperlipidemia 09/28/2012   COPD (chronic obstructive pulmonary disease) ([place]) 05/22/2012   Osteoporosis 05/22/2012    Juel Burrow, PT, DPT 09/11/2020, 10:04 AM  [address] [place], [place], [address] Phone: [phone]   Fax:  [phone]  Name: Kristina Ayala MRN: [id number] Date of Birth: [birth year]

## 2020-09-11 NOTE — Telephone Encounter (Signed)
Letter written and placed at front desk.

## 2020-09-11 NOTE — Telephone Encounter (Signed)
Left message for patient to return call to clinic.  If she calls back she should still have refills on the Lipitor and should be taking the 40 mg daily. (She was going to try to increase meds up to 3-4 days)  Dr. Quay Burow would like to switch her to Crestor since it is a stronger medication.   See if she is okay with the switch. (I left her a message regarding this yesterday)

## 2020-09-11 NOTE — Telephone Encounter (Signed)
Pt called stating she would like Dr. Tamala Julian to write a letter stating that she needs a mobile chair to help her go up and down stairs. She currently has one but it is broke and the person at her facility [facility]) needs a letter stating why the pt needs a mobile chair.

## 2020-09-12 ENCOUNTER — Ambulatory Visit: Payer: Medicare Other | Admitting: Rehabilitative and Restorative Service Providers"

## 2020-09-12 ENCOUNTER — Encounter: Payer: Self-pay | Admitting: Rehabilitative and Restorative Service Providers"

## 2020-09-12 DIAGNOSIS — R2689 Other abnormalities of gait and mobility: Secondary | ICD-10-CM | POA: Diagnosis not present

## 2020-09-12 DIAGNOSIS — R262 Difficulty in walking, not elsewhere classified: Secondary | ICD-10-CM | POA: Diagnosis not present

## 2020-09-12 DIAGNOSIS — M5442 Lumbago with sciatica, left side: Secondary | ICD-10-CM | POA: Diagnosis not present

## 2020-09-12 DIAGNOSIS — M25572 Pain in left ankle and joints of left foot: Secondary | ICD-10-CM | POA: Diagnosis not present

## 2020-09-12 DIAGNOSIS — M6281 Muscle weakness (generalized): Secondary | ICD-10-CM | POA: Diagnosis not present

## 2020-09-12 DIAGNOSIS — R252 Cramp and spasm: Secondary | ICD-10-CM | POA: Diagnosis not present

## 2020-09-12 NOTE — Therapy (Signed)
[facility]. [place], [place], [address] Phone: [phone]   Fax:  [phone]  Physical Therapy Treatment  Patient Details  Name: Kristina Ayala MRN: [id number] Date of Birth: 02/10/1945 Referring Provider (PT): Dr Hulan Saas   Encounter Date: 09/12/2020   PT End of Session - 09/12/20 0931     Visit Number 3    Date for PT Re-Evaluation 10/24/20    PT Start Time 0845    PT Stop Time 0925    PT Time Calculation (min) 40 min    Activity Tolerance Patient tolerated treatment well    Behavior During Therapy [facility] for tasks assessed/performed             Past Medical History:  Diagnosis Date   Arthritis    Bursitis    left elbow   Clostridium difficile infection    Diabetes mellitus without complication ([place])    Frequent falls    HLD (hyperlipidemia)    Hypertension    Infectious colitis     Past Surgical History:  Procedure Laterality Date   CATARACT EXTRACTION Bilateral    COLONOSCOPY     ORIF PROXIMAL TIBIAL PLATEAU FRACTURE Left 02/19/2012   knee   POLYPECTOMY     TONSILLECTOMY      There were no vitals filed for this visit.   Subjective Assessment - 09/12/20 0848     Subjective I was sore after yesterday, but am much better this morning.    Patient Stated Goals Pt would like to have less pain to be able to negotiate steps and return to gardening.    Currently in Pain? No/denies                               [facility] Adult PT Treatment/Exercise - 09/12/20 0001       Knee/Hip Exercises: Stretches   Gastroc Stretch Both;3 reps;20 seconds      Knee/Hip Exercises: Aerobic   Nustep L5 x6 min      Knee/Hip Exercises: Standing   Heel Raises Both;2 sets;10 reps    Other Standing Knee Exercises On Air[employer]:  ball toss, marching      Knee/Hip Exercises: Seated   Long Arc Quad Strengthening;Both;2 sets;10 reps    Long Arc Quad Weight 2 lbs.    Clamshell with  TheraBand Yellow   2x10   Other Seated Knee/Hip Exercises L Ankle yellow tband:  inversion, eversion, DF 2x10 each    Marching Strengthening;Both;2 sets;10 reps    Marching Weights 2 lbs.    Hamstring Curl Strengthening;Both;2 sets;10 reps    Hamstring Limitations yellow tband    Sit to Sand 10 reps;with UE support   standing on [employer]                     PT Short Term Goals - 09/11/20 1002       PT SHORT TERM GOAL #1   Title be independent in initial HEP    Status On-going               PT Long Term Goals - 09/12/20 0930       PT LONG TERM GOAL #2   Title Pt will increase L ankle ROM to [facility] to allow her to negotiate steps.    Status On-going      PT LONG TERM GOAL #3   Title increase Berg balance test  to 48/56 to place her at a low risk of falling.    Status On-going      PT LONG TERM GOAL #4   Title Patient will report at least a 60% improvement in symptoms with ADLs and IADLs.    Status On-going      PT LONG TERM GOAL #5   Title Pt will increase LLE strength to at least 4+/5 to allow her to more easily negotiate steps.    Status On-going                   Plan - 09/12/20 0928     Clinical Impression Statement Ms Desmarais arrived at session without any complaints of pain.  She reports that she was sore last night after her session, but she is feeling much better this morning.  She continues to be the most anxious with fear of falling with AirEx activities, but is able to complete.  She tolerated ther ex well and only required minimal cuing for technique during ther ex.    PT Treatment/Interventions ADLs/Self Care Home Management;Cryotherapy;Electrical Stimulation;Gait training;Neuromuscular re-education;Balance training;Therapeutic exercise;Therapeutic activities;Moist Heat;Stair training;Functional mobility training;Patient/family education;Manual techniques;Iontophoresis '4mg'$ /ml Dexamethasone;Ultrasound;Passive range of motion;Taping;Traction;Dry  needling;Joint Manipulations;Spinal Manipulations    PT Next Visit Plan assess HEP, strengthening, flexibility, modalities/manual as indicated    Consulted and Agree with Plan of Care Patient             Patient will benefit from skilled therapeutic intervention in order to improve the following deficits and impairments:  Abnormal gait, Decreased range of motion, Difficulty walking, Increased muscle spasms, Decreased activity tolerance, Pain, Decreased balance, Impaired flexibility, Improper body mechanics, Decreased mobility, Decreased strength  Visit Diagnosis: Muscle weakness (generalized)  Pain in left ankle and joints of left foot  Balance problem  Acute left-sided low back pain with left-sided sciatica  Difficulty in walking, not elsewhere classified     Problem List Patient Active Problem List   Diagnosis Date Noted   Coronary artery calcification seen on CAT scan 09/09/2020   Aortic atherosclerosis ([place]) 08/31/2020   Right rib fracture 07/10/2020   Left ankle injury 07/10/2020   Closed low lateral malleolus fracture 07/10/2020   Snoring 05/23/2020   Cough 12/04/2019   Pancreatic mass 11/08/2019   DOE (dyspnea on exertion) 03/20/2019   Chronic left SI joint pain 03/13/2019   Leg cramping 01/10/2019   Osteoarthritis 01/10/2019   Right lower quadrant abdominal pain 11/09/2018   Adnexal mass, right 11/09/2018   Lung nodules - stable - no f/u needed 06/19/2018   Thyromegaly 10/28/2017   Restless leg syndrome 10/12/2017   Greater trochanteric bursitis of left hip 07/26/2017   Hypokalemia 04/18/2017   Acute bronchitis 12/28/2016   Degenerative arthritis of left knee 12/01/2016   Anxiety 12/01/2016   Diabetes mellitus without complication ([place]) [id number]   Bursitis of right shoulder 09/04/2015   Lateral meniscus derangement 06/03/2015   Tear of LCL (lateral collateral ligament) of knee 06/03/2015   Lumbar radiculopathy 09/19/2014   Low back pain 01/04/2014    Bilateral knee pain 01/04/2014   Diverticulosis of colon without hemorrhage 07/26/2013   Trigger thumb of left hand 03/06/2013   Essential hypertension, benign 02/15/2013   Hyperlipidemia 09/28/2012   COPD (chronic obstructive pulmonary disease) ([place]) 05/22/2012   Osteoporosis 05/22/2012    Juel Burrow, PT, DPT 09/12/2020, 9:49 AM  [address] [place], [place], [address] Phone: [phone]   Fax:  [phone]  Name:  TAKESHIA MURR MRN: [id number] Date of Birth: Dec 27, 1945

## 2020-09-15 ENCOUNTER — Ambulatory Visit: Payer: Medicare Other | Admitting: Rehabilitative and Restorative Service Providers"

## 2020-09-15 ENCOUNTER — Other Ambulatory Visit: Payer: Self-pay

## 2020-09-15 ENCOUNTER — Ambulatory Visit: Payer: Medicare Other

## 2020-09-15 ENCOUNTER — Encounter: Payer: Self-pay | Admitting: Rehabilitative and Restorative Service Providers"

## 2020-09-15 DIAGNOSIS — M5442 Lumbago with sciatica, left side: Secondary | ICD-10-CM

## 2020-09-15 DIAGNOSIS — M25572 Pain in left ankle and joints of left foot: Secondary | ICD-10-CM | POA: Diagnosis not present

## 2020-09-15 DIAGNOSIS — R2689 Other abnormalities of gait and mobility: Secondary | ICD-10-CM

## 2020-09-15 DIAGNOSIS — M6281 Muscle weakness (generalized): Secondary | ICD-10-CM

## 2020-09-15 DIAGNOSIS — R252 Cramp and spasm: Secondary | ICD-10-CM | POA: Diagnosis not present

## 2020-09-15 DIAGNOSIS — R262 Difficulty in walking, not elsewhere classified: Secondary | ICD-10-CM

## 2020-09-15 DIAGNOSIS — M810 Age-related osteoporosis without current pathological fracture: Secondary | ICD-10-CM

## 2020-09-15 NOTE — Therapy (Signed)
[facility]. [place], [place], [address] Phone: [phone]   Fax:  [phone]  Physical Therapy Treatment  Patient Details  Name: Kristina Ayala MRN: [id number] Date of Birth: [birth year] Referring Provider (PT): Dr Hulan Saas   Encounter Date: 09/15/2020   PT End of Session - 09/15/20 0937     Visit Number 4    Date for PT Re-Evaluation 10/24/20    PT Start Time 0930    PT Stop Time 1010    PT Time Calculation (min) 40 min    Activity Tolerance Patient tolerated treatment well    Behavior During Therapy [facility] for tasks assessed/performed             Past Medical History:  Diagnosis Date   Arthritis    Bursitis    left elbow   Clostridium difficile infection    Diabetes mellitus without complication ([place])    Frequent falls    HLD (hyperlipidemia)    Hypertension    Infectious colitis     Past Surgical History:  Procedure Laterality Date   CATARACT EXTRACTION Bilateral    COLONOSCOPY     ORIF PROXIMAL TIBIAL PLATEAU FRACTURE Left 02/19/2012   knee   POLYPECTOMY     TONSILLECTOMY      There were no vitals filed for this visit.   Subjective Assessment - 09/15/20 0936     Subjective I am doing great, no pain.  I think that Thursday may be my last day.    Patient Stated Goals Pt would like to have less pain to be able to negotiate steps and return to gardening.    Currently in Pain? No/denies                [facility] PT Assessment - 09/15/20 0001       Observation/Other Assessments   Focus on Therapeutic Outcomes (FOTO)  69%                           [facility] Adult PT Treatment/Exercise - 09/15/20 0001       Ambulation/Gait   Ambulation/Gait Yes    Ambulation/Gait Assistance 7: Independent    Ambulation Distance (Feet) 600 Feet    Assistive device None    Ambulation Surface Outdoor;Paved    Stairs Yes    Stairs Assistance 7: Independent    Stair Management  Technique One rail Left;Alternating pattern    Number of Stairs 14    Curb 7: Independent      Knee/Hip Exercises: Aerobic   Nustep L5 x6 min      Knee/Hip Exercises: Machines for Strengthening   Cybex Knee Extension 15# 2x10    Cybex Knee Flexion 15# 2x10    Cybex Leg Press 20# 2x10      Knee/Hip Exercises: Standing   Walking with Sports Cord 20# 4 way x3 each      Knee/Hip Exercises: Seated   Sit to Sand 10 reps;with UE support   standing on AirEx                     PT Short Term Goals - 09/15/20 1027       PT SHORT TERM GOAL #1   Title be independent in initial HEP    Status Achieved               PT Long Term Goals - 09/15/20 1027  PT LONG TERM GOAL #1   Title be independent in advanced HEP    Status On-going      PT LONG TERM GOAL #2   Title Pt will increase L ankle ROM to [facility] to allow her to negotiate steps.    Status Partially Met      PT LONG TERM GOAL #3   Title increase Berg balance test to 48/56 to place her at a low risk of falling.    Status On-going      PT LONG TERM GOAL #4   Title Patient will report at least a 60% improvement in symptoms with ADLs and IADLs.    Status On-going      PT LONG TERM GOAL #5   Title Pt will increase LLE strength to at least 4+/5 to allow her to more easily negotiate steps.    Status On-going                   Plan - 09/15/20 1021     Clinical Impression Statement Ms Armel continues to make great progress towards goal related activities and continues without pain.  She had one loss of balance on resisted ambulation with sports cord requiring min A to correct.  Following ambulation, she was dypsnic following ambulation outside and stair negotiation requiring brief seated recovery period.  She was able to negotiate staircase with unilateral rail with reciprocal stepping pattern.  She was educated about the benfefits of continuing skilled PT to help her fully meet her goals and maximize  independence.    PT Treatment/Interventions ADLs/Self Care Home Management;Cryotherapy;Electrical Stimulation;Gait training;Neuromuscular re-education;Balance training;Therapeutic exercise;Therapeutic activities;Moist Heat;Stair training;Functional mobility training;Patient/family education;Manual techniques;Iontophoresis 59m/ml Dexamethasone;Ultrasound;Passive range of motion;Taping;Traction;Dry needling;Joint Manipulations;Spinal Manipulations    PT Next Visit Plan assess HEP, strengthening, flexibility, modalities/manual as indicated    Consulted and Agree with Plan of Care Patient             Patient will benefit from skilled therapeutic intervention in order to improve the following deficits and impairments:  Abnormal gait, Decreased range of motion, Difficulty walking, Increased muscle spasms, Decreased activity tolerance, Pain, Decreased balance, Impaired flexibility, Improper body mechanics, Decreased mobility, Decreased strength  Visit Diagnosis: Muscle weakness (generalized)  Pain in left ankle and joints of left foot  Balance problem  Acute left-sided low back pain with left-sided sciatica  Difficulty in walking, not elsewhere classified     Problem List Patient Active Problem List   Diagnosis Date Noted   Coronary artery calcification seen on CAT scan 09/09/2020   [place] atherosclerosis ([place] 08/31/2020   Right rib fracture 07/10/2020   Left ankle injury 07/10/2020   Closed low lateral malleolus fracture 07/10/2020   Snoring 05/23/2020   Cough 12/04/2019   Pancreatic mass 11/08/2019   DOE (dyspnea on exertion) 03/20/2019   Chronic left SI joint pain 03/13/2019   Leg cramping 01/10/2019   Osteoarthritis 01/10/2019   Right lower quadrant abdominal pain 11/09/2018   Adnexal mass, right 11/09/2018   Lung nodules - stable - no f/u needed 06/19/2018   Thyromegaly 10/28/2017   Restless leg syndrome 10/12/2017   Greater trochanteric bursitis of left hip 07/26/2017    Hypokalemia 04/18/2017   Acute bronchitis 12/28/2016   Degenerative arthritis of left knee 12/01/2016   Anxiety 12/01/2016   Diabetes mellitus without complication ([place] 062/56/3893  Bursitis of right shoulder 09/04/2015   Lateral meniscus derangement 06/03/2015   Tear of LCL (lateral collateral ligament) of knee 06/03/2015   Lumbar radiculopathy  09/19/2014   Low back pain 01/04/2014   Bilateral knee pain 01/04/2014   Diverticulosis of colon without hemorrhage 07/26/2013   Trigger thumb of left hand 03/06/2013   Essential hypertension, benign 02/15/2013   Hyperlipidemia 09/28/2012   COPD (chronic obstructive pulmonary disease) ([place]) 05/22/2012   Osteoporosis 05/22/2012    Juel Burrow, PT, DPT 09/15/2020, 10:29 AM  [address] [place], [place], [address] Phone: [phone]   Fax:  [phone]  Name: Kristina Ayala MRN: [id number] Date of Birth: [birth year]

## 2020-09-16 MED ORDER — ROSUVASTATIN CALCIUM 40 MG PO TABS: 40.0000 mg | ORAL_TABLET | Freq: Every day | ORAL | 3 refills | Status: DC

## 2020-09-16 NOTE — Telephone Encounter (Signed)
   Patient calling for status of Crestor being called to pharmacy

## 2020-09-16 NOTE — Addendum Note (Signed)
Addended by: [name] on: 09/16/2020 12:23 PM   Modules accepted: Orders

## 2020-09-16 NOTE — Telephone Encounter (Signed)
Message left for patient today 

## 2020-09-16 NOTE — Telephone Encounter (Signed)
Crestor sent to pharmacy.  Lets recheck cholesterol and liver tests in about 6 weeks-ordered

## 2020-09-17 ENCOUNTER — Ambulatory Visit (INDEPENDENT_AMBULATORY_CARE_PROVIDER_SITE_OTHER): Payer: Medicare Other

## 2020-09-17 ENCOUNTER — Other Ambulatory Visit: Payer: Self-pay

## 2020-09-17 DIAGNOSIS — M810 Age-related osteoporosis without current pathological fracture: Secondary | ICD-10-CM

## 2020-09-17 MED ORDER — DENOSUMAB 60 MG/ML ~~LOC~~ SOSY
60.0000 mg | PREFILLED_SYRINGE | Freq: Once | SUBCUTANEOUS | Status: AC
  Administered 2020-09-17: 60 mg via SUBCUTANEOUS

## 2020-09-17 NOTE — Progress Notes (Signed)
Pt here for Prolia injection per Dr Quay Burow.  Prolia given subcutaneous in left arm and pt tolerated injection well.  Next Prolia injection scheduled for 03/25/21.

## 2020-09-17 NOTE — Telephone Encounter (Signed)
Shortness of breath is not a common side effect.  If she really thinks the Lipitor is causing shortness of breath I would recommend stopping it to see if the shortness of breath goes away-if it does then we would need to come up with a different plan besides the Crestor.  The only way we will know for sure is for her to take a break from the atorvastatin for 2 to 4 weeks

## 2020-09-17 NOTE — Telephone Encounter (Signed)
Pt in office for Prolia injection. Expresses concerns of taking any statins at all due to side effects of shortness of breath & "causing more harm"; states she saw these things on television. Pt would like to know if the risks outweigh the benefits as she was already SOB with lipitor & is concerned it will get worse.

## 2020-09-18 ENCOUNTER — Ambulatory Visit: Payer: Medicare Other | Admitting: Rehabilitative and Restorative Service Providers"

## 2020-09-18 NOTE — Telephone Encounter (Signed)
Left detailed message with PCP response on answering machine.

## 2020-09-19 ENCOUNTER — Encounter: Payer: Self-pay | Admitting: Rehabilitative and Restorative Service Providers"

## 2020-09-19 ENCOUNTER — Other Ambulatory Visit: Payer: Self-pay

## 2020-09-19 ENCOUNTER — Ambulatory Visit: Payer: Medicare Other | Admitting: Rehabilitative and Restorative Service Providers"

## 2020-09-19 DIAGNOSIS — M6281 Muscle weakness (generalized): Secondary | ICD-10-CM | POA: Diagnosis not present

## 2020-09-19 DIAGNOSIS — M25572 Pain in left ankle and joints of left foot: Secondary | ICD-10-CM | POA: Diagnosis not present

## 2020-09-19 DIAGNOSIS — R262 Difficulty in walking, not elsewhere classified: Secondary | ICD-10-CM | POA: Diagnosis not present

## 2020-09-19 DIAGNOSIS — M5442 Lumbago with sciatica, left side: Secondary | ICD-10-CM

## 2020-09-19 DIAGNOSIS — R252 Cramp and spasm: Secondary | ICD-10-CM | POA: Diagnosis not present

## 2020-09-19 DIAGNOSIS — R2689 Other abnormalities of gait and mobility: Secondary | ICD-10-CM | POA: Diagnosis not present

## 2020-09-19 NOTE — Therapy (Signed)
[place]. [place], [place], [address] Phone: [phone]   Fax:  [phone]  Physical Therapy Treatment  Patient Details  Name: Kristina Ayala MRN: [id number] Date of Birth: 1945/04/12 Referring Provider (PT): Dr Hulan Saas   Encounter Date: 09/19/2020   PT End of Session - 09/19/20 0857     Visit Number 5    Date for PT Re-Evaluation 10/24/20    PT Start Time 0845    PT Stop Time 0925    PT Time Calculation (min) 40 min    Activity Tolerance Patient tolerated treatment well    Behavior During Therapy Kristina Ayala for tasks assessed/performed             Past Medical History:  Diagnosis Date   Arthritis    Bursitis    left elbow   Clostridium difficile infection    Diabetes mellitus without complication ([place])    Frequent falls    HLD (hyperlipidemia)    Hypertension    Infectious colitis     Past Surgical History:  Procedure Laterality Date   CATARACT EXTRACTION Bilateral    COLONOSCOPY     ORIF PROXIMAL TIBIAL PLATEAU FRACTURE Left 02/19/2012   knee   POLYPECTOMY     TONSILLECTOMY      There were no vitals filed for this visit.   Subjective Assessment - 09/19/20 0855     Subjective I am doing okay.    Patient Stated Goals Pt would like to have less pain to be able to negotiate steps and return to gardening.    Currently in Pain? Yes    Pain Score 4     Pain Location Back    Pain Orientation Lower    Pain Descriptors / Indicators Aching    Pain Type Chronic pain                [facility] PT Assessment - 09/19/20 0001       AROM   Left Ankle Dorsiflexion 7    Left Ankle Plantar Flexion 70                           [facility] Adult PT Treatment/Exercise - 09/19/20 0001       Ambulation/Gait   Gait Comments Ambulation outside around large back parking lot loop independently over various inclines without a loss of balance.      Knee/Hip Exercises: Machines for  Strengthening   Cybex Knee Extension 15# 2x10    Cybex Knee Flexion 20# 2x10    Other Machine Rows and Lats 20# 2x10      Knee/Hip Exercises: Standing   Walking with Sports Cord 20# 4 way x3 each      Knee/Hip Exercises: Seated   Sit to Sand 20 reps;without UE support   from black mat. x10 CP x10 OHP with yellow wt ball                     PT Short Term Goals - 09/15/20 1027       PT SHORT TERM GOAL #1   Title be independent in initial HEP    Status Achieved               PT Long Term Goals - 09/19/20 0944       PT LONG TERM GOAL #1   Title be independent in advanced HEP    Status On-going  PT LONG TERM GOAL #2   Title Pt will increase L ankle ROM to [facility] to allow her to negotiate steps.    Status Achieved                   Plan - 09/19/20 0929     Clinical Impression Statement Kristina Ayala continues to make great progress goal related activities.  She states that she no longer has ankle pain, but at times does have back pain.  Session today primarily focused on strengthening.  Pt reported some fatigue and required minimal recovery times, but stated that she felt good completing.  She continues to have some difficulty with balance with resisted walking with sports cord and required cuing for improved posture with rows and lats.  Encouraged Kristina Ayala to continue to work with PT to allow her to fully reach all her goals, as she had mentioned stopping due to a busy schedule.  She states that she realizes that she could benefit from more to help her get stronger and better balanced.  Pt additionally states that she is looking to join the local gym to allow her to participate in water aerobics again.    PT Treatment/Interventions ADLs/Self Care Home Management;Cryotherapy;Electrical Stimulation;Gait training;Neuromuscular re-education;Balance training;Therapeutic exercise;Therapeutic activities;Moist Heat;Stair training;Functional mobility  training;Patient/family education;Manual techniques;Iontophoresis '4mg'$ /ml Dexamethasone;Ultrasound;Passive range of motion;Taping;Traction;Dry needling;Joint Manipulations;Spinal Manipulations    PT Next Visit Plan assess HEP, strengthening, flexibility, modalities/manual as indicated    Consulted and Agree with Plan of Care Patient             Patient will benefit from skilled therapeutic intervention in order to improve the following deficits and impairments:  Abnormal gait, Decreased range of motion, Difficulty walking, Increased muscle spasms, Decreased activity tolerance, Pain, Decreased balance, Impaired flexibility, Improper body mechanics, Decreased mobility, Decreased strength  Visit Diagnosis: Muscle weakness (generalized)  Balance problem  Acute left-sided low back pain with left-sided sciatica  Difficulty in walking, not elsewhere classified     Problem List Patient Active Problem List   Diagnosis Date Noted   Coronary artery calcification seen on CAT scan 09/09/2020   Aortic atherosclerosis ([place]) 08/31/2020   Right rib fracture 07/10/2020   Left ankle injury 07/10/2020   Closed low lateral malleolus fracture 07/10/2020   Snoring 05/23/2020   Cough 12/04/2019   Pancreatic mass 11/08/2019   DOE (dyspnea on exertion) 03/20/2019   Chronic left SI joint pain 03/13/2019   Leg cramping 01/10/2019   Osteoarthritis 01/10/2019   Right lower quadrant abdominal pain 11/09/2018   Adnexal mass, right 11/09/2018   Lung nodules - stable - no f/u needed 06/19/2018   Thyromegaly 10/28/2017   Restless leg syndrome 10/12/2017   Greater trochanteric bursitis of left hip 07/26/2017   Hypokalemia 04/18/2017   Acute bronchitis 12/28/2016   Degenerative arthritis of left knee 12/01/2016   Anxiety 12/01/2016   Diabetes mellitus without complication ([place]) [id number]   Bursitis of right shoulder 09/04/2015   Lateral meniscus derangement 06/03/2015   Tear of LCL (lateral collateral  ligament) of knee 06/03/2015   Lumbar radiculopathy 09/19/2014   Low back pain 01/04/2014   Bilateral knee pain 01/04/2014   Diverticulosis of colon without hemorrhage 07/26/2013   Trigger thumb of left hand 03/06/2013   Essential hypertension, benign 02/15/2013   Hyperlipidemia 09/28/2012   COPD (chronic obstructive pulmonary disease) ([place]) 05/22/2012   Osteoporosis 05/22/2012    Juel Burrow, PT, DPT 09/19/2020, 9:45 AM  [facility] [phone] [place], [place], [address] Phone: [phone]   Fax:  [phone]  Name: Kristina Ayala MRN: [id number] Date of Birth: Feb 17, 1945

## 2020-09-26 ENCOUNTER — Telehealth: Payer: Self-pay | Admitting: Internal Medicine

## 2020-09-26 ENCOUNTER — Encounter: Payer: Medicare Other | Admitting: Physical Therapy

## 2020-09-26 NOTE — Telephone Encounter (Signed)
Seeking advice  Patient calling to report extreme joint pain all over. She feels this is due to rosuvastatin (CRESTOR) 40 MG tablet

## 2020-09-26 NOTE — Telephone Encounter (Signed)
Stop the medication.  If her pain goes away and we should consider a small dose of this such as 5 or 10 mg 3 times a week, but lets first stop the medication and make sure it goes away.

## 2020-09-30 ENCOUNTER — Other Ambulatory Visit: Payer: Self-pay | Admitting: Internal Medicine

## 2020-09-30 MED ORDER — ROSUVASTATIN CALCIUM 10 MG PO TABS: 10.0000 mg | ORAL_TABLET | ORAL | 3 refills | Status: DC

## 2020-09-30 NOTE — Telephone Encounter (Signed)
Spoke with patient today and info given. 

## 2020-09-30 NOTE — Addendum Note (Signed)
Addended by: [name] on: 09/30/2020 12:15 PM   Modules accepted: Orders

## 2020-10-01 DIAGNOSIS — L723 Sebaceous cyst: Secondary | ICD-10-CM | POA: Diagnosis not present

## 2020-10-06 ENCOUNTER — Telehealth: Payer: Self-pay | Admitting: Internal Medicine

## 2020-10-06 NOTE — Telephone Encounter (Signed)
Patient says shes had a head cold for about a week now & thinks its turning into a sinus infection... woul;d like for provider to submit something over to pharmacy if possible  Pharmacy: [facility], [place] [facility] [address]  Phone:  [phone] Fax:  [phone]

## 2020-10-06 NOTE — Progress Notes (Signed)
Subjective:    Patient ID: Kristina Ayala, female    DOB: [birth year], 75 y.o.   MRN: [id number]  This visit occurred during the SARS-CoV-2 public health emergency.  Safety protocols were in place, including screening questions prior to the visit, additional usage of staff PPE, and extensive cleaning of exam room while observing appropriate contact time as indicated for disinfecting solutions.    HPI She is here for an acute visit for cold symptoms.   Her symptoms started Thursday, 5 days ago  She is experiencing mild fatigue, congestion, postnasal drip, cough due to postnasal drip-occasionally bringing up clear phlegm and headaches.  Her symptoms have improved over the past few days.  She has tried taking mucinex, saline nasal spray, tylenol   Her covid test was negative.    She has a sleep study tomorrow night and needs to sleep.     Medications and allergies reviewed with patient and updated if appropriate.  Patient Active Problem List   Diagnosis Date Noted   Coronary artery calcification seen on CAT scan 09/09/2020   Aortic atherosclerosis ([place]) 08/31/2020   Right rib fracture 07/10/2020   Left ankle injury 07/10/2020   Closed low lateral malleolus fracture 07/10/2020   Snoring 05/23/2020   Cough 12/04/2019   Pancreatic mass 11/08/2019   DOE (dyspnea on exertion) 03/20/2019   Chronic left SI joint pain 03/13/2019   Leg cramping 01/10/2019   Osteoarthritis 01/10/2019   Right lower quadrant abdominal pain 11/09/2018   Adnexal mass, right 11/09/2018   Lung nodules - stable - no f/u needed 06/19/2018   Thyromegaly 10/28/2017   Restless leg syndrome 10/12/2017   Greater trochanteric bursitis of left hip 07/26/2017   Hypokalemia 04/18/2017   Acute bronchitis 12/28/2016   Degenerative arthritis of left knee 12/01/2016   Anxiety 12/01/2016   Diabetes mellitus without complication ([place]) A999333   Bursitis of right shoulder 09/04/2015   Lateral meniscus  derangement 06/03/2015   Tear of LCL (lateral collateral ligament) of knee 06/03/2015   Lumbar radiculopathy 09/19/2014   Low back pain 01/04/2014   Bilateral knee pain 01/04/2014   Diverticulosis of colon without hemorrhage 07/26/2013   Trigger thumb of left hand 03/06/2013   Essential hypertension, benign 02/15/2013   Hyperlipidemia 09/28/2012   COPD (chronic obstructive pulmonary disease) ([place]) 05/22/2012   Osteoporosis 05/22/2012    Current Outpatient Medications on File Prior to Visit  Medication Sig Dispense Refill   acetaminophen (TYLENOL) 500 MG tablet Take 500 mg by mouth every 6 (six) hours as needed for mild pain, fever or headache.     ALPRAZolam (XANAX) 0.5 MG tablet TAKE 1 TABLET(0.5 MG) BY MOUTH AT BEDTIME AS NEEDED FOR ANXIETY 30 tablet 2   amLODipine (NORVASC) 5 MG tablet Take 1.5 tablets (7.5 mg total) by mouth daily. 135 tablet 1   Camphor-Eucalyptus-Menthol (VICKS VAPORUB EX) Apply 1 application topically as needed (congestion).     cholecalciferol (VITAMIN D) 25 MCG (1000 UNIT) tablet Take 2,000 Units by mouth daily.     Cyanocobalamin (VITAMIN B-12) 2500 MCG SUBL Place 2,500 mcg under the tongue daily.     denosumab (PROLIA) 60 MG/ML SOSY injection INJECT 60 MG INTO THE SKIN ONCE FOR 1 DOSE (Patient taking differently: Inject 60 mg into the skin every 6 (six) months.) 1 mL 0   gabapentin (NEURONTIN) 100 MG capsule Take 2 capsules (200 mg total) by mouth at bedtime. (Patient taking differently: Take 200 mg by mouth at bedtime as needed (neuropathy).) 180  capsule 0   guaifenesin (ROBITUSSIN) 100 MG/5ML syrup Take 200 mg by mouth 3 (three) times daily as needed for cough.     hydrochlorothiazide (HYDRODIURIL) 25 MG tablet TAKE 1 TABLET(25 MG) BY MOUTH EVERY MORNING 90 tablet 3   lisinopril (ZESTRIL) 40 MG tablet TAKE 1 TABLET(40 MG) BY MOUTH DAILY (Patient taking differently: Take 40 mg by mouth daily.) 90 tablet 1   potassium chloride (KLOR-CON) 8 MEQ tablet TAKE 2  TABLETS(16 MEQ) BY MOUTH DAILY (Patient taking differently: Take 16 mEq by mouth daily.) 180 tablet 1   rosuvastatin (CRESTOR) 10 MG tablet Take 1 tablet (10 mg total) by mouth 3 (three) times a week. 13 tablet 3   TURMERIC PO Take 1 tablet by mouth daily.     chlorhexidine (PERIDEX) 0.12 % solution 15 mLs 2 (two) times daily.     ofloxacin (OCUFLOX) 0.3 % ophthalmic solution INSTILL 1 DROP RIGHT EYE FOUR TIMES DAILY AS DIRECTED     sulfamethoxazole-trimethoprim (BACTRIM DS) 800-160 MG tablet SMARTSIG:1 Tablet(s) By Mouth Every 12 Hours (Patient not taking: Reported on 10/07/2020)     No current facility-administered medications on file prior to visit.    Past Medical History:  Diagnosis Date   Arthritis    Bursitis    left elbow   Clostridium difficile infection    Diabetes mellitus without complication ([facility])    Frequent falls    HLD (hyperlipidemia)    Hypertension    Infectious colitis     Past Surgical History:  Procedure Laterality Date   CATARACT EXTRACTION Bilateral    COLONOSCOPY     ORIF PROXIMAL TIBIAL PLATEAU FRACTURE Left 02/19/2012   knee   POLYPECTOMY     TONSILLECTOMY      Social History   Socioeconomic History   Marital status: Divorced    Spouse name: Not on file   Number of children: 2   Years of education: Not on file   Highest education level: Associate degree: academic program  Occupational History   Occupation: RETIRED  Tobacco Use   Smoking status: Former    Packs/day: 0.25    Years: 50.00    Pack years: 12.50    Types: Cigarettes    Quit date: 08/28/2014    Years since quitting: 6.1   Smokeless tobacco: Never   Tobacco comments:    1 pack every 3 days  Vaping Use   Vaping Use: Never used  Substance and Sexual Activity   Alcohol use: No    Alcohol/week: 0.0 standard drinks   Drug use: No   Sexual activity: Not Currently  Other Topics Concern   Not on file  Social History Narrative   Lives alone    Right handed   Caffeine: 3x weekly          Social Determinants of Health   Financial Resource Strain: Low Risk    Difficulty of Paying Living Expenses: Not hard at all  Food Insecurity: No Food Insecurity   Worried About Charity fundraiser in the Last Year: Never true   Ran Out of Food in the Last Year: Never true  Transportation Needs: No Transportation Needs   Lack of Transportation (Medical): No   Lack of Transportation (Non-Medical): No  Physical Activity: Sufficiently Active   Days of Exercise per Week: 5 days   Minutes of Exercise per Session: 30 min  Stress: No Stress Concern Present   Feeling of Stress : Not at all  Social Connections: Moderately Integrated  Frequency of Communication with Friends and Family: More than three times a week   Frequency of Social Gatherings with Friends and Family: More than three times a week   Attends Religious Services: More than 4 times per year   Active Member of [employer] or Organizations: Yes   Attends Archivist Meetings: More than 4 times per year   Marital Status: Widowed    Family History  Problem Relation Age of Onset   Diverticulosis Mother    Ovarian cancer Mother    Pancreatic cancer Father    Lung cancer Father    Colon polyps Sister    Colon cancer Neg Hx     Review of Systems  Constitutional:  Positive for fatigue. Negative for appetite change, chills and fever.  HENT:  Positive for congestion and postnasal drip. Negative for ear pain, sinus pressure, sinus pain and sore throat.   Respiratory:  Positive for cough (from PND). Negative for chest tightness, shortness of breath and wheezing.   Gastrointestinal:  Positive for diarrhea (mild, likely from food).  Musculoskeletal:  Negative for myalgias.  Neurological:  Positive for headaches. Negative for dizziness and light-headedness.      Objective:   Vitals:   10/07/20 1444  BP: 120/74  Pulse: 87  Temp: 98.5 F (36.9 C)  SpO2: 98%   BP Readings from Last 3 Encounters:  10/07/20  120/74  09/01/20 126/80  08/21/20 124/82   Wt Readings from Last 3 Encounters:  10/07/20 166 lb (75.3 kg)  09/01/20 165 lb (74.8 kg)  08/21/20 166 lb (75.3 kg)   Body mass index is 27.62 kg/m.   Physical Exam    GENERAL APPEARANCE: Appears stated age, well appearing, NAD EYES: conjunctiva clear, no icterus HENT: bilateral tympanic membranes and ear canals normal, oropharynx with no erythema or exudates, trachea midline, no cervical or supraclavicular lymphadenopathy LUNGS: Unlabored breathing, good air entry bilaterally, clear to auscultation without wheeze or crackles CARDIOVASCULAR: Normal S1,S2 , no edema SKIN: Warm, dry      Assessment & Plan:    Flu vaccine today  See Problem List for Assessment and Plan of chronic medical problems.

## 2020-10-06 NOTE — Telephone Encounter (Signed)
[facility] FYI.Kristina Ayala states she has head cold with post nasal drip cough.   Call PCP when office opens or go to Urgent Care. Patient understood and agreed.

## 2020-10-06 NOTE — Telephone Encounter (Signed)
Appointment made for tomorrow

## 2020-10-07 ENCOUNTER — Encounter: Payer: Self-pay | Admitting: Internal Medicine

## 2020-10-07 ENCOUNTER — Ambulatory Visit (INDEPENDENT_AMBULATORY_CARE_PROVIDER_SITE_OTHER): Payer: Medicare Other | Admitting: Internal Medicine

## 2020-10-07 ENCOUNTER — Other Ambulatory Visit: Payer: Self-pay

## 2020-10-07 VITALS — BP 120/74 | HR 87 | Temp 98.5°F | Ht 65.0 in | Wt 166.0 lb

## 2020-10-07 DIAGNOSIS — Z23 Encounter for immunization: Secondary | ICD-10-CM

## 2020-10-07 DIAGNOSIS — J069 Acute upper respiratory infection, unspecified: Secondary | ICD-10-CM

## 2020-10-07 MED ORDER — FLUTICASONE PROPIONATE 50 MCG/ACT NA SUSP: 2.0000 | Freq: Every day | NASAL | 6 refills | Status: AC

## 2020-10-07 MED ORDER — HYDROCODONE BIT-HOMATROP MBR 5-1.5 MG/5ML PO SOLN: 5.0000 mL | Freq: Three times a day (TID) | ORAL | 0 refills | Status: DC | PRN

## 2020-10-07 NOTE — Addendum Note (Signed)
Addended by: Marcina Millard on: 10/07/2020 03:19 PM   Modules accepted: Orders

## 2020-10-07 NOTE — Assessment & Plan Note (Signed)
Acute Symptoms likely viral in nature Symptoms improving and only symptomatic treatment as needed Continue Mucinex, Tylenol, saline nasal spray Will start Hycodan cough syrup every 8 hours as needed and Flonase daily for postnasal drip Increase rest and fluids Call if symptoms worsen or do not improve

## 2020-10-07 NOTE — Patient Instructions (Addendum)
     Medications changes include :   hydcodan cough syrup.  Start flonase.  Continue the mucinex.    Your prescription(s) have been submitted to your pharmacy. Please take as directed and contact our office if you believe you are having problem(s) with the medication(s).    Please call if there is no improvement in your symptoms.

## 2020-10-08 ENCOUNTER — Ambulatory Visit (INDEPENDENT_AMBULATORY_CARE_PROVIDER_SITE_OTHER): Payer: Medicare Other | Admitting: Neurology

## 2020-10-08 DIAGNOSIS — R0683 Snoring: Secondary | ICD-10-CM | POA: Diagnosis not present

## 2020-10-08 DIAGNOSIS — G4719 Other hypersomnia: Secondary | ICD-10-CM

## 2020-10-08 DIAGNOSIS — G4710 Hypersomnia, unspecified: Secondary | ICD-10-CM

## 2020-10-08 DIAGNOSIS — J438 Other emphysema: Secondary | ICD-10-CM

## 2020-10-15 DIAGNOSIS — Z1152 Encounter for screening for COVID-19: Secondary | ICD-10-CM | POA: Diagnosis not present

## 2020-10-21 NOTE — Progress Notes (Signed)
[place] Phone: [phone] Subjective:   [name], am serving as a scribe for Dr. Hulan Saas.  This visit occurred during the SARS-CoV-2 public health emergency.  Safety protocols were in place, including screening questions prior to the visit, additional usage of staff PPE, and extensive cleaning of exam room while observing appropriate contact time as indicated for disinfecting solutions.   I'm seeing this patient by the request  of:  [name], MD  CC: Left knee pain and other complaints  GYK:[id number]  08/21/2020 Patient still has findings that are consistent with advancing of L tear.  Patient does need some physical therapy to help with strengthening and range of motion of the ankle.  On ultrasound today very mild hypoechoic changes noted.  Follow-up again in 8 weeks weeks  Update 10/22/2020 Alfie A Gisler is a 75 y.o. female coming in with complaint of L ankle and R rib pain. Patient states that she did go to PT which helped alleviate her foot pain.   Patient having increase in lower back pain and L knee pain. Patient took 300mg  of CBD and this is helping to decrease her back pain.  Patient states that the moment the knee seem to be worse.  Some mild increase in instability.  Still has not gotten the stair seat to work and is having to walk up and down stairs.  Gets more discomfort.       Past Medical History:  Diagnosis Date   Arthritis    Bursitis    left elbow   Clostridium difficile infection    Diabetes mellitus without complication (HCC)    Frequent falls    HLD (hyperlipidemia)    Hypertension    Infectious colitis    Past Surgical History:  Procedure Laterality Date   CATARACT EXTRACTION Bilateral    COLONOSCOPY     ORIF PROXIMAL TIBIAL PLATEAU FRACTURE Left 02/19/2012   knee   POLYPECTOMY     TONSILLECTOMY     Social History   Socioeconomic History   Marital  status: Divorced    Spouse name: Not on file   Number of children: 2   Years of education: Not on file   Highest education level: Associate degree: academic program  Occupational History   Occupation: RETIRED  Tobacco Use   Smoking status: Former    Packs/day: 0.25    Years: 50.00    Pack years: 12.50    Types: Cigarettes    Quit date: 08/28/2014    Years since quitting: 6.1   Smokeless tobacco: Never   Tobacco comments:    1 pack every 3 days  Vaping Use   Vaping Use: Never used  Substance and Sexual Activity   Alcohol use: No    Alcohol/week: 0.0 standard drinks   Drug use: No   Sexual activity: Not Currently  Other Topics Concern   Not on file  Social History Narrative   Lives alone    Right handed   Caffeine: 3x weekly         Social Determinants of Health   Financial Resource Strain: Low Risk    Difficulty of Paying Living Expenses: Not hard at all  Food Insecurity: No Food Insecurity   Worried About Charity fundraiser in the Last Year: Never true   Ran Out of Food in the Last Year: Never true  Transportation Needs: No Transportation Needs   Lack of Transportation (  Medical): No   Lack of Transportation (Non-Medical): No  Physical Activity: Sufficiently Active   Days of Exercise per Week: 5 days   Minutes of Exercise per Session: 30 min  Stress: No Stress Concern Present   Feeling of Stress : Not at all  Social Connections: Moderately Integrated   Frequency of Communication with Friends and Family: More than three times a week   Frequency of Social Gatherings with Friends and Family: More than three times a week   Attends Religious Services: More than 4 times per year   Active Member of [employer] or Organizations: Yes   Attends Archivist Meetings: More than 4 times per year   Marital Status: Widowed   Allergies  Allergen Reactions   Doxycycline Diarrhea   Minocycline Diarrhea   Wellbutrin [Bupropion] Swelling    Per pt her tongue was swollen and  sore/symptoms stopped once the medication was discontinued.    Penicillins Rash    Has patient had a PCN reaction causing immediate rash, facial/tongue/throat swelling, SOB or lightheadedness with hypotension: yes Has patient had a PCN reaction causing severe rash involving mucus membranes or skin necrosis: no Has patient had a PCN reaction that required hospitalization: unknown Has patient had a PCN reaction occurring within the last 10 years: no If all of the above answers are "NO", then may proceed with Cephalosporin use.    Z-Pak [Azithromycin] Other (See Comments)    diarrhea   Family History  Problem Relation Age of Onset   Diverticulosis Mother    Ovarian cancer Mother    Pancreatic cancer Father    Lung cancer Father    Colon polyps Sister    Colon cancer Neg Hx     Current Outpatient Medications (Endocrine & Metabolic):    denosumab (PROLIA) 60 MG/ML SOSY injection, INJECT 60 MG INTO THE SKIN ONCE FOR 1 DOSE (Patient taking differently: Inject 60 mg into the skin every 6 (six) months.)  Current Outpatient Medications (Cardiovascular):    amLODipine (NORVASC) 5 MG tablet, Take 1.5 tablets (7.5 mg total) by mouth daily.   hydrochlorothiazide (HYDRODIURIL) 25 MG tablet, TAKE 1 TABLET(25 MG) BY MOUTH EVERY MORNING   lisinopril (ZESTRIL) 40 MG tablet, TAKE 1 TABLET(40 MG) BY MOUTH DAILY (Patient taking differently: Take 40 mg by mouth daily.)   rosuvastatin (CRESTOR) 10 MG tablet, Take 1 tablet (10 mg total) by mouth 3 (three) times a week.  Current Outpatient Medications (Respiratory):    Camphor-Eucalyptus-Menthol (VICKS VAPORUB EX), Apply 1 application topically as needed (congestion).   fluticasone (FLONASE) 50 MCG/ACT nasal spray, Place 2 sprays into both nostrils daily.   guaifenesin (ROBITUSSIN) 100 MG/5ML syrup, Take 200 mg by mouth 3 (three) times daily as needed for cough.   HYDROcodone bit-homatropine (HYCODAN) 5-1.5 MG/5ML syrup, Take 5 mLs by mouth every 8 (eight)  hours as needed for cough.  Current Outpatient Medications (Analgesics):    acetaminophen (TYLENOL) 500 MG tablet, Take 500 mg by mouth every 6 (six) hours as needed for mild pain, fever or headache.  Current Outpatient Medications (Hematological):    Cyanocobalamin (VITAMIN B-12) 2500 MCG SUBL, Place 2,500 mcg under the tongue daily.  Current Outpatient Medications (Other):    ALPRAZolam (XANAX) 0.5 MG tablet, TAKE 1 TABLET(0.5 MG) BY MOUTH AT BEDTIME AS NEEDED FOR ANXIETY   chlorhexidine (PERIDEX) 0.12 % solution, 15 mLs 2 (two) times daily.   cholecalciferol (VITAMIN D) 25 MCG (1000 UNIT) tablet, Take 2,000 Units by mouth daily.   gabapentin (NEURONTIN)  100 MG capsule, Take 2 capsules (200 mg total) by mouth at bedtime. (Patient taking differently: Take 200 mg by mouth at bedtime as needed (neuropathy).)   ofloxacin (OCUFLOX) 0.3 % ophthalmic solution, INSTILL 1 DROP RIGHT EYE FOUR TIMES DAILY AS DIRECTED   potassium chloride (KLOR-CON) 8 MEQ tablet, TAKE 2 TABLETS(16 MEQ) BY MOUTH DAILY (Patient taking differently: Take 16 mEq by mouth daily.)   sulfamethoxazole-trimethoprim (BACTRIM DS) 800-160 MG tablet,    TURMERIC PO, Take 1 tablet by mouth daily.   Reviewed prior external information including notes and imaging from  primary care provider As well as notes that were available from care everywhere and other healthcare systems.  Past medical history, social, surgical and family history all reviewed in electronic medical record.  No pertanent information unless stated regarding to the chief complaint.   Review of Systems:  No headache, visual changes, nausea, vomiting, diarrhea, constipation, dizziness, abdominal pain, skin rash, fevers, chills, night sweats, weight loss, swollen lymph nodes, joint swelling, chest pain, shortness of breath, mood changes. POSITIVE muscle aches, body aches  Objective  Blood pressure 122/82, pulse 92, height 5\' 5"  (1.651 m), weight 166 lb (75.3 kg),  SpO2 98 %.   General: No apparent distress alert and oriented x3 mood and affect normal, dressed appropriately.  HEENT: Pupils equal, extraocular movements intact  Respiratory: Patient's speak in full sentences and does not appear short of breath  Cardiovascular: No lower extremity edema, non tender, no erythema  Gait antalgic gait noted. Patient does have tenderness to palpation of the left knee noted.  Patient does have abnormality noted of the left knee secondary to previous surgery.  Patient does have instability noted with valgus and varus force noted.  Trace effusion noted of the patellofemoral joint.  Lacks the last 10 degrees of flexion.  After informed written and verbal consent, patient was seated on exam table. Left knee was prepped with alcohol swab and utilizing anterolateral approach, patient's left knee space was injected with 4:1  marcaine 0.5%: Kenalog 40mg /dL. Patient tolerated the procedure well without immediate complications.    Impression and Recommendations:     The above documentation has been reviewed and is accurate and complete Lyndal Pulley, DO

## 2020-10-22 ENCOUNTER — Ambulatory Visit (INDEPENDENT_AMBULATORY_CARE_PROVIDER_SITE_OTHER): Payer: Medicare Other | Admitting: Family Medicine

## 2020-10-22 ENCOUNTER — Encounter: Payer: Self-pay | Admitting: Family Medicine

## 2020-10-22 ENCOUNTER — Other Ambulatory Visit: Payer: Self-pay

## 2020-10-22 ENCOUNTER — Ambulatory Visit: Payer: Self-pay

## 2020-10-22 VITALS — BP 122/82 | HR 92 | Ht 65.0 in | Wt 166.0 lb

## 2020-10-22 DIAGNOSIS — M25572 Pain in left ankle and joints of left foot: Secondary | ICD-10-CM | POA: Diagnosis not present

## 2020-10-22 DIAGNOSIS — M1732 Unilateral post-traumatic osteoarthritis, left knee: Secondary | ICD-10-CM

## 2020-10-22 NOTE — Assessment & Plan Note (Signed)
Known arthritic changes.  He does have a history of the tibial plateau fracture.  Patient has not had an injection for greater than 1 year.  Hopefully this will make significant difference again.  We will get approval for viscosupplementation if necessary.  Patient is being treated appropriately for osteoporosis as well.  We will continue to monitor all her other elements.  Follow-up with me again 6 to 8 weeks, problem with exacerbation.

## 2020-10-22 NOTE — Patient Instructions (Signed)
Injected L knee today Will have gel approved Continue everything else See me in 2 months

## 2020-10-23 DIAGNOSIS — Z1152 Encounter for screening for COVID-19: Secondary | ICD-10-CM | POA: Diagnosis not present

## 2020-10-27 ENCOUNTER — Other Ambulatory Visit: Payer: Self-pay | Admitting: Internal Medicine

## 2020-11-03 ENCOUNTER — Telehealth: Payer: Self-pay | Admitting: *Deleted

## 2020-11-03 DIAGNOSIS — R0683 Snoring: Secondary | ICD-10-CM | POA: Insufficient documentation

## 2020-11-03 DIAGNOSIS — G4719 Other hypersomnia: Secondary | ICD-10-CM | POA: Insufficient documentation

## 2020-11-03 NOTE — Telephone Encounter (Signed)
-----   Message from Larey Seat, MD sent at 11/03/2020  8:41 AM EDT ----- IMPRESSION:  1. No evidence of Obstructive Sleep Apnea (OSA) 2. Mild Periodic Limb Movement Disorder (PLMD) 3. Intermittent Snoring was loud but did not cause arousals. 4. The patient was unable to initiate sleep again after a restroom break, cutting her sleep time short.  RECOMMENDATIONS:  Improve pain therapy with physical Rehab or orthopedic physician.  The patient stated that pain and discomfort are reasons for arousals, reflected in the high number of spontaneous arousals. This seems more a problem of insomnia, causing sleep time restriction, than apnea or PLMs.  Follow up in a sleep clinic is optional.

## 2020-11-03 NOTE — Procedures (Signed)
PATIENT'S NAME:  Kristina Ayala, Kristina Ayala DOB:      1945/10/19      MR#:    [id number]     DATE OF RECORDING: 10/08/2020 REFERRING M.D.:  Billey Gosling, MD Study Performed:   Baseline Polysomnogram HISTORY:  Kristina Ayala was seen on 08-04-2020. She is a right-handed [place] American female with a possible sleep disorder.  She has a past medical history of Arthritis, Bursitis, Clostridium difficile infection, Diabetes mellitus without complication ([place]), Frequent falls (2014- Dr Leonie Man), HLD (hyperlipidemia), Hypertension, and Infectious colitis. She reports she still loses balance- no warning- usually she is in a hurry when this happens, last fall in June 2022-" in a lavatory". She has finished PT.  Sleep habits are as follows: The patient's dinner time is between 6-7 PM. The patient goes to bed at 7.30 PM and continues to sleep for 6 hours, wakes rarely for bathroom breaks. She was told she snores loudly and stated she is a former smoker, now reporting EDS.  The preferred sleep position is variable- and she wakes up in supine, due to hip and neck pain - with the support of 1 pillow. Dreams are reportedly frequent/vivid.   6 AM is the usual rise time. The patient wakes up spontaneously at 5 AM  She reports feeling refreshed or restored in AM, with symptoms such as dry mouth, but no morning headaches. Naps are taken infrequently, lasting from 30 to 40 minutes and are more refreshing than nocturnal sleep.  The patient endorsed the Epworth Sleepiness Scale at 14/24 points.    The patient's weight 166 pounds with a height of 65 (inches), resulting in a BMI of 27.5 kg/m2.  The patient's neck circumference measured 15.5 inches.  CURRENT MEDICATIONS: Tylenol, Xanax, Norvasc, Lipitor, Vicks Vaporub, Vitamin D, Vitamin B-12, Prolia, Neurontin, Robitussin, Hydrodiuril, Zestril, Klor-Con, Turmeric   PROCEDURE:  This is a multichannel digital polysomnogram utilizing the Somnostar 11.2 system.  Electrodes and  sensors were applied and monitored per AASM Specifications.   EEG, EOG, Chin and Limb EMG, were sampled at 200 Hz.  ECG, Snore and Nasal Pressure, Thermal Airflow, Respiratory Effort, CPAP Flow and Pressure, Oximetry was sampled at 50 Hz. Digital video and audio were recorded.      BASELINE STUDY: Lights Out was at 20:59 and Lights On at 04:46.  Total recording time (TRT) was 467.5 minutes, with a total sleep time (TST) of 300 minutes.   The patient's sleep latency was 27.5 minutes.  REM latency was 170.5 minutes.  The sleep efficiency was 64.2 %.     SLEEP ARCHITECTURE: WASO (Wake after sleep onset) was 53.5 minutes.  There were 32 minutes in Stage N1, 206.5 minutes Stage N2, 37 minutes Stage N3 and 24.5 minutes in Stage REM.  The percentage of Stage N1 was 10.7%, Stage N2 was 68.8%, Stage N3 was 12.3% and Stage R (REM sleep) was 8.2%.    RESPIRATORY ANALYSIS:  There were a total of 14 respiratory events:  2 obstructive apneas, 0 central apneas and 0 mixed apneas with a total of 2 apneas and an apnea index (AI) of 0.4 /hour. There were 12 hypopneas with a hypopnea index of 2.4 /hour.      The total APNEA/HYPOPNEA INDEX (AHI) was 2.8/hour and the total RESPIRATORY DISTURBANCE INDEX was  2.8 /hour.  7 events occurred in REM sleep and 12 events in NREM. The REM AHI was  17.1 /hour, versus a non-REM AHI of 1.5. The patient spent 129.5 minutes of total sleep  time in the supine position and 171 minutes in non-supine.. The supine AHI was 4.1 versus a non-supine AHI of 1.8.  OXYGEN SATURATION & C02:  The Wake baseline 02 saturation was 96%, with the lowest being 84%. Time spent below 89% saturation equaled 2 minutes. The arousals were noted as: 48 were spontaneous, 1 associated with PLMs, 3 were associated with respiratory events.   PERIODIC LIMB MOVEMENTS:   The patient had a total of 94 Periodic Limb Movements.  The Periodic Limb Movement (PLM) Arousal index was 0.2/hour. PLMs clustered between 0.30 AM and  1.30 AM and spared REM sleep.  Audio and video analysis did not show any abnormal or unusual movements, behaviors, phonations or vocalizations.   Snoring was noted. EKG was in keeping with normal sinus rhythm (NSR).  IMPRESSION:  No evidence of Obstructive Sleep Apnea (OSA) Mild Periodic Limb Movement Disorder (PLMD) Intermittent Snoring was loud but did not cause arousals. The patient was unable to initiate sleep again after a restroom break, cutting her sleep time short.  RECOMMENDATIONS:  Improve pain therapy with physical Rehab or orthopedic physician.  The patient stated that pain and discomfort are reasons for arousals, reflected in the high number of spontaneous arousals. This seems more a problem of insomnia, causing sleep time restriction, than apnea or PLMs.  Follow up in a sleep clinic is optional.      I certify that I have reviewed the entire raw data recording prior to the issuance of this report in accordance with the Standards of Accreditation of the American Academy of Sleep Medicine (AASM)   Larey Seat, MD Diplomat, American Board of Psychiatry and Neurology  Diplomat, American Board of Sleep Medicine Market researcher, [facility] [employer]

## 2020-11-03 NOTE — Telephone Encounter (Signed)
Called and spoke with pt. Relayed results per Dr. Edwena Felty note. Pt verbalized understanding. She already has dont PT and sees ortho regularly. She found her current mattress is 76 y/o and spring poking out recently. She is going to work on getting new mattress. She feels this may be cause of sx. She will call back moving forward if she has any new or worsening sx.

## 2020-11-03 NOTE — Progress Notes (Signed)
IMPRESSION:  1. No evidence of Obstructive Sleep Apnea (OSA) 2. Mild Periodic Limb Movement Disorder (PLMD) 3. Intermittent Snoring was loud but did not cause arousals. 4. The patient was unable to initiate sleep again after a restroom break, cutting her sleep time short.  RECOMMENDATIONS:  Improve pain therapy with physical Rehab or orthopedic physician.  The patient stated that pain and discomfort are reasons for arousals, reflected in the high number of spontaneous arousals. This seems more a problem of insomnia, causing sleep time restriction, than apnea or PLMs.  Follow up in a sleep clinic is optional.

## 2020-11-07 DIAGNOSIS — Z1152 Encounter for screening for COVID-19: Secondary | ICD-10-CM | POA: Diagnosis not present

## 2020-11-12 DIAGNOSIS — L821 Other seborrheic keratosis: Secondary | ICD-10-CM | POA: Diagnosis not present

## 2020-11-12 DIAGNOSIS — D224 Melanocytic nevi of scalp and neck: Secondary | ICD-10-CM | POA: Diagnosis not present

## 2020-11-12 DIAGNOSIS — L720 Epidermal cyst: Secondary | ICD-10-CM | POA: Diagnosis not present

## 2020-11-13 ENCOUNTER — Telehealth: Payer: Self-pay | Admitting: Family Medicine

## 2020-11-13 NOTE — Telephone Encounter (Signed)
Patient called asking if another Epidural could be ordered for her? She said that the pain is no longer specifically in her lower back but going throughout her whole back.

## 2020-11-14 NOTE — Telephone Encounter (Signed)
I would say we could do the same but if worsening pain need to go to ed

## 2020-11-17 ENCOUNTER — Other Ambulatory Visit: Payer: Self-pay

## 2020-11-17 DIAGNOSIS — M5416 Radiculopathy, lumbar region: Secondary | ICD-10-CM

## 2020-11-17 NOTE — Telephone Encounter (Signed)
Patient called to let you know that her back pain has gotten much better. She bought a new mattress and that has really helped.

## 2020-11-17 NOTE — Telephone Encounter (Signed)
Left message for patient regarding epidural and with worsening pain to go into ED.

## 2020-11-27 ENCOUNTER — Other Ambulatory Visit: Payer: Self-pay | Admitting: Internal Medicine

## 2020-12-03 ENCOUNTER — Telehealth: Payer: Self-pay | Admitting: Internal Medicine

## 2020-12-03 NOTE — Telephone Encounter (Signed)
Patient calling in  Complaining of right ear pain  Patient says he it feels like an air pocket in her ear  Offered ov., patient declined  Requesting callback from nurse [phone]

## 2020-12-04 NOTE — Telephone Encounter (Signed)
Spoke with patient today. 

## 2020-12-09 ENCOUNTER — Encounter: Payer: Self-pay | Admitting: Internal Medicine

## 2020-12-09 ENCOUNTER — Other Ambulatory Visit: Payer: Self-pay

## 2020-12-09 ENCOUNTER — Ambulatory Visit (INDEPENDENT_AMBULATORY_CARE_PROVIDER_SITE_OTHER): Payer: Medicare Other | Admitting: Internal Medicine

## 2020-12-09 VITALS — BP 120/80 | HR 91 | Temp 98.3°F | Ht 65.0 in | Wt 165.0 lb

## 2020-12-09 DIAGNOSIS — I10 Essential (primary) hypertension: Secondary | ICD-10-CM

## 2020-12-09 DIAGNOSIS — L299 Pruritus, unspecified: Secondary | ICD-10-CM | POA: Insufficient documentation

## 2020-12-09 DIAGNOSIS — H6981 Other specified disorders of Eustachian tube, right ear: Secondary | ICD-10-CM | POA: Diagnosis not present

## 2020-12-09 DIAGNOSIS — H6991 Unspecified Eustachian tube disorder, right ear: Secondary | ICD-10-CM | POA: Insufficient documentation

## 2020-12-09 NOTE — Patient Instructions (Signed)
    Continue the zyrtec daily as needed.

## 2020-12-09 NOTE — Assessment & Plan Note (Signed)
Chronic BP well controlled Continue amlodipine 7.5 mg daily, hctz 25 mg daily, lisinopril 40 mg daily

## 2020-12-09 NOTE — Assessment & Plan Note (Signed)
Acute Improved with otc zyrtec -no longer having symptoms continue zyrtec otc prn

## 2020-12-09 NOTE — Progress Notes (Signed)
Subjective:    Patient ID: Kristina Ayala, female    DOB: 02/07/1945, 75 y.o.   MRN: [id number]  This visit occurred during the SARS-CoV-2 public health emergency.  Safety protocols were in place, including screening questions prior to the visit, additional usage of staff PPE, and extensive cleaning of exam room while observing appropriate contact time as indicated for disinfecting solutions.    HPI The patient is here for an acute visit.   Right ear fullness:  right has felt clogged for a while.  Nothing helped.  She had no hearing loss  Posterior scalp itchy.  She tried an antihistamine.  That helped the itching and the ear.    Medications and allergies reviewed with patient and updated if appropriate.  Patient Active Problem List   Diagnosis Date Noted   Loud snoring 11/03/2020   Excessive daytime sleepiness 11/03/2020   Coronary artery calcification seen on CAT scan 09/09/2020   Aortic atherosclerosis ([place]) 08/31/2020   Right rib fracture 07/10/2020   Left ankle injury 07/10/2020   Closed low lateral malleolus fracture 07/10/2020   Snoring 05/23/2020   Cough 12/04/2019   Pancreatic mass 11/08/2019   DOE (dyspnea on exertion) 03/20/2019   Chronic left SI joint pain 03/13/2019   Leg cramping 01/10/2019   Osteoarthritis 01/10/2019   Right lower quadrant abdominal pain 11/09/2018   Adnexal mass, right 11/09/2018   URI (upper respiratory infection) 10/18/2018   Lung nodules - stable - no f/u needed 06/19/2018   Thyromegaly 10/28/2017   Restless leg syndrome 10/12/2017   Greater trochanteric bursitis of left hip 07/26/2017   Hypokalemia 04/18/2017   Acute bronchitis 12/28/2016   Degenerative arthritis of left knee 12/01/2016   Anxiety 12/01/2016   Diabetes mellitus without complication ([place]) 62/37/6283   Bursitis of right shoulder 09/04/2015   Lateral meniscus derangement 06/03/2015   Tear of LCL (lateral collateral ligament) of knee 06/03/2015   Lumbar  radiculopathy 09/19/2014   Low back pain 01/04/2014   Bilateral knee pain 01/04/2014   Diverticulosis of colon without hemorrhage 07/26/2013   Trigger thumb of left hand 03/06/2013   Essential hypertension, benign 02/15/2013   Hyperlipidemia 09/28/2012   COPD (chronic obstructive pulmonary disease) ([place]) 05/22/2012   Osteoporosis 05/22/2012    Current Outpatient Medications on File Prior to Visit  Medication Sig Dispense Refill   acetaminophen (TYLENOL) 500 MG tablet Take 500 mg by mouth every 6 (six) hours as needed for mild pain, fever or headache.     ALPRAZolam (XANAX) 0.5 MG tablet TAKE 1 TABLET(0.5 MG) BY MOUTH AT BEDTIME AS NEEDED FOR ANXIETY 30 tablet 2   amLODipine (NORVASC) 5 MG tablet TAKE 1 AND 1/2 TABLETS(7.5 MG) BY MOUTH DAILY 135 tablet 1   Camphor-Eucalyptus-Menthol (VICKS VAPORUB EX) Apply 1 application topically as needed (congestion).     chlorhexidine (PERIDEX) 0.12 % solution 15 mLs 2 (two) times daily.     cholecalciferol (VITAMIN D) 25 MCG (1000 UNIT) tablet Take 2,000 Units by mouth daily.     Cyanocobalamin (VITAMIN B-12) 2500 MCG SUBL Place 2,500 mcg under the tongue daily.     denosumab (PROLIA) 60 MG/ML SOSY injection INJECT 60 MG INTO THE SKIN ONCE FOR 1 DOSE (Patient taking differently: Inject 60 mg into the skin every 6 (six) months.) 1 mL 0   fluticasone (FLONASE) 50 MCG/ACT nasal spray Place 2 sprays into both nostrils daily. 16 g 6   gabapentin (NEURONTIN) 100 MG capsule Take 2 capsules (200 mg total) by  mouth at bedtime. (Patient taking differently: Take 200 mg by mouth at bedtime as needed (neuropathy).) 180 capsule 0   guaifenesin (ROBITUSSIN) 100 MG/5ML syrup Take 200 mg by mouth 3 (three) times daily as needed for cough.     hydrochlorothiazide (HYDRODIURIL) 25 MG tablet TAKE 1 TABLET(25 MG) BY MOUTH EVERY MORNING 90 tablet 3   HYDROcodone bit-homatropine (HYCODAN) 5-1.5 MG/5ML syrup Take 5 mLs by mouth every 8 (eight) hours as needed for cough. 120 mL  0   lisinopril (ZESTRIL) 40 MG tablet TAKE 1 TABLET(40 MG) BY MOUTH DAILY (Patient taking differently: Take 40 mg by mouth daily.) 90 tablet 1   ofloxacin (OCUFLOX) 0.3 % ophthalmic solution INSTILL 1 DROP RIGHT EYE FOUR TIMES DAILY AS DIRECTED     potassium chloride (KLOR-CON) 8 MEQ tablet TAKE 2 TABLETS(16 MEQ) BY MOUTH DAILY (Patient taking differently: Take 16 mEq by mouth daily.) 180 tablet 1   rosuvastatin (CRESTOR) 10 MG tablet Take 1 tablet (10 mg total) by mouth 3 (three) times a week. 13 tablet 3   sulfamethoxazole-trimethoprim (BACTRIM DS) 800-160 MG tablet      TURMERIC PO Take 1 tablet by mouth daily.     No current facility-administered medications on file prior to visit.    Past Medical History:  Diagnosis Date   Arthritis    Bursitis    left elbow   Clostridium difficile infection    Diabetes mellitus without complication (HCC)    Frequent falls    HLD (hyperlipidemia)    Hypertension    Infectious colitis     Past Surgical History:  Procedure Laterality Date   CATARACT EXTRACTION Bilateral    COLONOSCOPY     ORIF PROXIMAL TIBIAL PLATEAU FRACTURE Left 02/19/2012   knee   POLYPECTOMY     TONSILLECTOMY      Social History   Socioeconomic History   Marital status: Divorced    Spouse name: Not on file   Number of children: 2   Years of education: Not on file   Highest education level: Associate degree: academic program  Occupational History   Occupation: RETIRED  Tobacco Use   Smoking status: Former    Packs/day: 0.25    Years: 50.00    Pack years: 12.50    Types: Cigarettes    Quit date: 08/28/2014    Years since quitting: 6.2   Smokeless tobacco: Never   Tobacco comments:    1 pack every 3 days  Vaping Use   Vaping Use: Never used  Substance and Sexual Activity   Alcohol use: No    Alcohol/week: 0.0 standard drinks   Drug use: No   Sexual activity: Not Currently  Other Topics Concern   Not on file  Social History Narrative   Lives alone     Right handed   Caffeine: 3x weekly         Social Determinants of Health   Financial Resource Strain: Low Risk    Difficulty of Paying Living Expenses: Not hard at all  Food Insecurity: No Food Insecurity   Worried About Charity fundraiser in the Last Year: Never true   Ran Out of Food in the Last Year: Never true  Transportation Needs: No Transportation Needs   Lack of Transportation (Medical): No   Lack of Transportation (Non-Medical): No  Physical Activity: Sufficiently Active   Days of Exercise per Week: 5 days   Minutes of Exercise per Session: 30 min  Stress: No Stress Concern Present  Feeling of Stress : Not at all  Social Connections: Moderately Integrated   Frequency of Communication with Friends and Family: More than three times a week   Frequency of Social Gatherings with Friends and Family: More than three times a week   Attends Religious Services: More than 4 times per year   Active Member of [employer] or Organizations: Yes   Attends Archivist Meetings: More than 4 times per year   Marital Status: Widowed    Family History  Problem Relation Age of Onset   Diverticulosis Mother    Ovarian cancer Mother    Pancreatic cancer Father    Lung cancer Father    Colon polyps Sister    Colon cancer Neg Hx     Review of Systems  Constitutional:  Negative for fever.  HENT:  Positive for ear pain. Negative for congestion, sinus pain and sore throat.   Skin:        Itchy posterior scalp - no skin flaking  Neurological:  Positive for headaches (occ).      Objective:   Vitals:   12/09/20 1315  BP: 120/80  Pulse: 91  Temp: 98.3 F (36.8 C)  SpO2: 97%   BP Readings from Last 3 Encounters:  12/09/20 120/80  10/22/20 122/82  10/07/20 120/74   Wt Readings from Last 3 Encounters:  12/09/20 165 lb (74.8 kg)  10/22/20 166 lb (75.3 kg)  10/07/20 166 lb (75.3 kg)   Body mass index is 27.46 kg/m.   Physical Exam    GENERAL APPEARANCE: Appears stated  age, well appearing, NAD EYES: conjunctiva clear, no icterus HENT: bilateral tympanic membranes and ear canals normal, oropharynx with no erythema or exudates, trachea midline, no cervical lymphadenopathy, positive thyromegaly SKIN: Warm, , scalp looks good- no erythema or flanking      Assessment & Plan:    See Problem List for Assessment and Plan of chronic medical problems.

## 2020-12-09 NOTE — Assessment & Plan Note (Signed)
Acute ? Related to hair products Improved last night with zyrtec and today no itching Will continue zyrtec prn If no improvement can consider steroid foam/shampoo

## 2020-12-09 NOTE — Progress Notes (Signed)
[name] Phone: [phone] Subjective:   Kristina Ayala, am serving as a scribe for Dr. Hulan Saas. This visit occurred during the SARS-CoV-2 public health emergency.  Safety protocols were in place, including screening questions prior to the visit, additional usage of staff PPE, and extensive cleaning of exam room while observing appropriate contact time as indicated for disinfecting solutions.   I'm seeing this patient by the request  of:  [name], MD  CC: Low back pain, knee pain  ATF:[id number]  10/22/2020 Known arthritic changes.  He does have a history of the tibial plateau fracture.  Patient has not had an injection for greater than 1 year.  Hopefully this will make significant difference again.  We will get approval for viscosupplementation if necessary.  Patient is being treated appropriately for osteoporosis as well.  We will continue to monitor all her other elements.  Follow-up with me again 6 to 8 weeks, problem with exacerbation.  Update 12/10/2020 Kristina Ayala is a 75 y.o. female coming in with complaint of lumbar spine, L knee and L ankle pain. Patient states that her knees are doing well. Back pain is also doing well at this time after getting a new mattress. Uses CBD pills for pain relief.  Feels like it is making benefit.  Is doing relatively well overall.     Past Medical History:  Diagnosis Date   Arthritis    Bursitis    left elbow   Clostridium difficile infection    Diabetes mellitus without complication (HCC)    Frequent falls    HLD (hyperlipidemia)    Hypertension    Infectious colitis    Past Surgical History:  Procedure Laterality Date   CATARACT EXTRACTION Bilateral    COLONOSCOPY     ORIF PROXIMAL TIBIAL PLATEAU FRACTURE Left 02/19/2012   knee   POLYPECTOMY     TONSILLECTOMY     Social History   Socioeconomic History   Marital status: Divorced     Spouse name: Not on file   Number of children: 2   Years of education: Not on file   Highest education level: Associate degree: academic program  Occupational History   Occupation: RETIRED  Tobacco Use   Smoking status: Former    Packs/day: 0.25    Years: 50.00    Pack years: 12.50    Types: Cigarettes    Quit date: 08/28/2014    Years since quitting: 6.2   Smokeless tobacco: Never   Tobacco comments:    1 pack every 3 days  Vaping Use   Vaping Use: Never used  Substance and Sexual Activity   Alcohol use: Ayala    Alcohol/week: 0.0 standard drinks   Drug use: Ayala   Sexual activity: Not Currently  Other Topics Concern   Not on file  Social History Narrative   Lives alone    Right handed   Caffeine: 3x weekly         Social Determinants of Health   Financial Resource Strain: Low Risk    Difficulty of Paying Living Expenses: Not hard at all  Food Insecurity: Ayala Food Insecurity   Worried About Charity fundraiser in the Last Year: Never true   Ran Out of Food in the Last Year: Never true  Transportation Needs: Ayala Transportation Needs   Lack of Transportation (Medical): Ayala   Lack of Transportation (Non-Medical): Ayala  Physical Activity: Sufficiently Active  Days of Exercise per Week: 5 days   Minutes of Exercise per Session: 30 min  Stress: Ayala Stress Concern Present   Feeling of Stress : Not at all  Social Connections: Moderately Integrated   Frequency of Communication with Friends and Family: More than three times a week   Frequency of Social Gatherings with Friends and Family: More than three times a week   Attends Religious Services: More than 4 times per year   Active Member of [employer] or Organizations: Yes   Attends Archivist Meetings: More than 4 times per year   Marital Status: Widowed   Allergies  Allergen Reactions   Doxycycline Diarrhea   Minocycline Diarrhea   Wellbutrin [Bupropion] Swelling    Per pt her tongue was swollen and sore/symptoms  stopped once the medication was discontinued.    Penicillins Rash    Has patient had a PCN reaction causing immediate rash, facial/tongue/throat swelling, SOB or lightheadedness with hypotension: yes Has patient had a PCN reaction causing severe rash involving mucus membranes or skin necrosis: Ayala Has patient had a PCN reaction that required hospitalization: unknown Has patient had a PCN reaction occurring within the last 10 years: Ayala If all of the above answers are "Ayala", then may proceed with Cephalosporin use.    Z-Pak [Azithromycin] Other (See Comments)    diarrhea   Family History  Problem Relation Age of Onset   Diverticulosis Mother    Ovarian cancer Mother    Pancreatic cancer Father    Lung cancer Father    Colon polyps Sister    Colon cancer Neg Hx     Current Outpatient Medications (Endocrine & Metabolic):    denosumab (PROLIA) 60 MG/ML SOSY injection, INJECT 60 MG INTO THE SKIN ONCE FOR 1 DOSE (Patient taking differently: Inject 60 mg into the skin every 6 (six) months.)  Current Outpatient Medications (Cardiovascular):    amLODipine (NORVASC) 5 MG tablet, TAKE 1 AND 1/2 TABLETS(7.5 MG) BY MOUTH DAILY   hydrochlorothiazide (HYDRODIURIL) 25 MG tablet, TAKE 1 TABLET(25 MG) BY MOUTH EVERY MORNING   lisinopril (ZESTRIL) 40 MG tablet, TAKE 1 TABLET(40 MG) BY MOUTH DAILY (Patient taking differently: Take 40 mg by mouth daily.)   rosuvastatin (CRESTOR) 10 MG tablet, Take 1 tablet (10 mg total) by mouth 3 (three) times a week.  Current Outpatient Medications (Respiratory):    Camphor-Eucalyptus-Menthol (VICKS VAPORUB EX), Apply 1 application topically as needed (congestion).   fluticasone (FLONASE) 50 MCG/ACT nasal spray, Place 2 sprays into both nostrils daily.   guaifenesin (ROBITUSSIN) 100 MG/5ML syrup, Take 200 mg by mouth 3 (three) times daily as needed for cough.   HYDROcodone bit-homatropine (HYCODAN) 5-1.5 MG/5ML syrup, Take 5 mLs by mouth every 8 (eight) hours as needed  for cough.  Current Outpatient Medications (Analgesics):    acetaminophen (TYLENOL) 500 MG tablet, Take 500 mg by mouth every 6 (six) hours as needed for mild pain, fever or headache.  Current Outpatient Medications (Hematological):    Cyanocobalamin (VITAMIN B-12) 2500 MCG SUBL, Place 2,500 mcg under the tongue daily.  Current Outpatient Medications (Other):    ALPRAZolam (XANAX) 0.5 MG tablet, TAKE 1 TABLET(0.5 MG) BY MOUTH AT BEDTIME AS NEEDED FOR ANXIETY   chlorhexidine (PERIDEX) 0.12 % solution, 15 mLs 2 (two) times daily.   cholecalciferol (VITAMIN D) 25 MCG (1000 UNIT) tablet, Take 2,000 Units by mouth daily.   gabapentin (NEURONTIN) 100 MG capsule, Take 2 capsules (200 mg total) by mouth at bedtime. (Patient taking differently:  Take 200 mg by mouth at bedtime as needed (neuropathy).)   ofloxacin (OCUFLOX) 0.3 % ophthalmic solution, INSTILL 1 DROP RIGHT EYE FOUR TIMES DAILY AS DIRECTED   potassium chloride (KLOR-CON) 8 MEQ tablet, TAKE 2 TABLETS(16 MEQ) BY MOUTH DAILY (Patient taking differently: Take 16 mEq by mouth daily.)   sulfamethoxazole-trimethoprim (BACTRIM DS) 800-160 MG tablet,    TURMERIC PO, Take 1 tablet by mouth daily.     Review of Systems:  Ayala headache, visual changes, nausea, vomiting, diarrhea, constipation, dizziness, abdominal pain, skin rash, fevers, chills, night sweats, weight loss, swollen lymph nodes, body aches, joint swelling, chest pain, shortness of breath, mood changes. POSITIVE muscle aches mild  Objective  Blood pressure 140/82, pulse 86, height 5\' 5"  (1.651 m), weight 164 lb (74.4 kg), SpO2 98 %.   General: Ayala apparent distress alert and oriented x3 mood and affect normal, dressed appropriately.  HEENT: Pupils equal, extraocular movements intact  Respiratory: Patient's speak in full sentences and does not appear short of breath  Cardiovascular: Ayala lower extremity edema, non tender, Ayala erythema  Gait normal with good balance and coordination.   MSK: He does have the arthritic changes noted of the left knee.  Mild instability with valgus and varus force.    Impression and Recommendations:     The above documentation has been reviewed and is accurate and complete Lyndal Pulley, DO

## 2020-12-10 ENCOUNTER — Ambulatory Visit (INDEPENDENT_AMBULATORY_CARE_PROVIDER_SITE_OTHER): Payer: Medicare Other | Admitting: Family Medicine

## 2020-12-10 DIAGNOSIS — M5416 Radiculopathy, lumbar region: Secondary | ICD-10-CM | POA: Diagnosis not present

## 2020-12-10 DIAGNOSIS — M1732 Unilateral post-traumatic osteoarthritis, left knee: Secondary | ICD-10-CM | POA: Diagnosis not present

## 2020-12-10 NOTE — Patient Instructions (Signed)
Glad you are doing well Good luck with dietary preferences of your family See me in February

## 2020-12-10 NOTE — Assessment & Plan Note (Signed)
Stable at the moment.  No significant changes in management.

## 2020-12-10 NOTE — Assessment & Plan Note (Signed)
Patient is doing well after the injection at this time and wants to hold on any other injections.  States that she thinks she can make it potentially 5 months before needing 1.  We discussed with patient that if any increase in stability or inflammation to see [place] sooner.  We will continue to monitor otherwise.  Follow-up again in 3 months

## 2020-12-15 ENCOUNTER — Other Ambulatory Visit: Payer: Self-pay

## 2020-12-15 ENCOUNTER — Encounter: Payer: Medicare Other | Admitting: Nurse Practitioner

## 2020-12-15 ENCOUNTER — Telehealth: Payer: Self-pay | Admitting: Internal Medicine

## 2020-12-15 NOTE — Progress Notes (Deleted)
Patient ID: Kristina Ayala, female    DOB: 10-17-1945, 75 y.o.   MRN: [id number]  Virtual visit completed through ***, a video enabled telemedicine application. Due to national recommendations of social distancing due to COVID-19, a virtual visit is felt to be most appropriate for this patient at this time. Reviewed limitations, risks, security and privacy concerns of performing a virtual visit and the availability of in person appointments. I also reviewed that there may be a patient responsible charge related to this service. The patient agreed to proceed.   Patient location: home Provider location:  at [facility], office Persons participating in this virtual visit: patient, provider ***  If any vitals were documented, they were collected by patient at home unless specified below.    There were no vitals taken for this visit.   CC: *** Subjective:   HPI: Kristina Ayala is a 75 y.o. female presenting on 12/15/2020 for Chest congestion (Sx started 12/14/20, Chills, coughing up brownish phlegm, headache. )    ***      Relevant past medical, surgical, family and social history reviewed and updated as indicated. Interim medical history since our last visit reviewed. Allergies and medications reviewed and updated. Outpatient Medications Prior to Visit  Medication Sig Dispense Refill   acetaminophen (TYLENOL) 500 MG tablet Take 500 mg by mouth every 6 (six) hours as needed for mild pain, fever or headache.     ALPRAZolam (XANAX) 0.5 MG tablet TAKE 1 TABLET(0.5 MG) BY MOUTH AT BEDTIME AS NEEDED FOR ANXIETY 30 tablet 2   amLODipine (NORVASC) 5 MG tablet TAKE 1 AND 1/2 TABLETS(7.5 MG) BY MOUTH DAILY 135 tablet 1   Camphor-Eucalyptus-Menthol (VICKS VAPORUB EX) Apply 1 application topically as needed (congestion).     chlorhexidine (PERIDEX) 0.12 % solution 15 mLs 2 (two) times daily.     cholecalciferol (VITAMIN D) 25 MCG (1000 UNIT) tablet Take 2,000 Units by mouth  daily.     Cyanocobalamin (VITAMIN B-12) 2500 MCG SUBL Place 2,500 mcg under the tongue daily.     denosumab (PROLIA) 60 MG/ML SOSY injection INJECT 60 MG INTO THE SKIN ONCE FOR 1 DOSE (Patient taking differently: Inject 60 mg into the skin every 6 (six) months.) 1 mL 0   fluticasone (FLONASE) 50 MCG/ACT nasal spray Place 2 sprays into both nostrils daily. 16 g 6   gabapentin (NEURONTIN) 100 MG capsule Take 2 capsules (200 mg total) by mouth at bedtime. (Patient taking differently: Take 200 mg by mouth at bedtime as needed (neuropathy).) 180 capsule 0   guaifenesin (ROBITUSSIN) 100 MG/5ML syrup Take 200 mg by mouth 3 (three) times daily as needed for cough.     hydrochlorothiazide (HYDRODIURIL) 25 MG tablet TAKE 1 TABLET(25 MG) BY MOUTH EVERY MORNING 90 tablet 3   lisinopril (ZESTRIL) 40 MG tablet TAKE 1 TABLET(40 MG) BY MOUTH DAILY (Patient taking differently: Take 40 mg by mouth daily.) 90 tablet 1   potassium chloride (KLOR-CON) 8 MEQ tablet TAKE 2 TABLETS(16 MEQ) BY MOUTH DAILY (Patient taking differently: Take 16 mEq by mouth daily.) 180 tablet 1   TURMERIC PO Take 1 tablet by mouth daily.     HYDROcodone bit-homatropine (HYCODAN) 5-1.5 MG/5ML syrup Take 5 mLs by mouth every 8 (eight) hours as needed for cough. 120 mL 0   ofloxacin (OCUFLOX) 0.3 % ophthalmic solution INSTILL 1 DROP RIGHT EYE FOUR TIMES DAILY AS DIRECTED     rosuvastatin (CRESTOR) 10 MG tablet Take 1 tablet (10 mg total)  by mouth 3 (three) times a week. 13 tablet 3   sulfamethoxazole-trimethoprim (BACTRIM DS) 800-160 MG tablet      No facility-administered medications prior to visit.     Per HPI unless specifically indicated in ROS section below Review of Systems Objective:  There were no vitals taken for this visit.  Wt Readings from Last 3 Encounters:  12/10/20 164 lb (74.4 kg)  12/09/20 165 lb (74.8 kg)  10/22/20 166 lb (75.3 kg)       Physical exam: Gen: alert, NAD, not ill appearing Pulm: speaks in complete  sentences without increased work of breathing Psych: normal mood, normal thought content      Results for orders placed or performed in visit on 09/05/20  HM DIABETES EYE EXAM  Result Value Ref Range   HM Diabetic Eye Exam No Retinopathy No Retinopathy   Assessment & Plan:   Problem List Items Addressed This Visit   None    No orders of the defined types were placed in this encounter.  No orders of the defined types were placed in this encounter.   I discussed the assessment and treatment plan with the patient. The patient was provided an opportunity to ask questions and all were answered. The patient agreed with the plan and demonstrated an understanding of the instructions. The patient was advised to call back or seek an in-person evaluation if the symptoms worsen or if the condition fails to improve as anticipated.  Follow up plan: No follow-ups on file.  Romilda Garret, NP

## 2020-12-15 NOTE — Telephone Encounter (Signed)
Patient calling in  Patient says she believes she has a head cold.. she is experiencing severe headaches & a lot of chest congestion  Scheduled patient VV w/ provider [facility] but patient says her phone went dead at the beginning of visit so she was not able to have the full visit  Offered next available.. patient declined.. requesting cb from nurse

## 2020-12-15 NOTE — Progress Notes (Deleted)
Was not able to complete visit due to patients phone dying. See encounter with messaging from patient to PCP

## 2020-12-16 NOTE — Telephone Encounter (Signed)
Virtual scheduled for in the morning at 8:30 am.

## 2020-12-16 NOTE — Progress Notes (Signed)
Virtual Visit via Video Note  I connected with Kristina Ayala on 12/16/20 at  8:30 AM EST by a video enabled telemedicine application and verified that I am speaking with the correct person using two identifiers.   I discussed the limitations of evaluation and management by telemedicine and the availability of in person appointments. The patient expressed understanding and agreed to proceed.  Present for the visit:  Myself, Dr Billey Gosling, Oda Cogan.  The patient is currently at home and I am in the office.    No referring provider.    History of Present Illness: She is here for an acute visit for cold symptoms. Her granddaughter has the flu and she was around her.   Her symptoms started a couple of days ago.  She is experiencing chills, fatigue, body aches, congestion, runny nose, cough - productive at times, headaches.   She has tried taking tylenol and ibuprofen, robitussin   Review of Systems  Constitutional:  Positive for chills and malaise/fatigue. Negative for fever.       Dec appetite  HENT:  Positive for congestion. Negative for ear pain, sinus pain and sore throat.        Runny nose  Respiratory:  Positive for cough (productive at times). Negative for shortness of breath and wheezing.   Gastrointestinal:  Negative for diarrhea and nausea.  Musculoskeletal:  Positive for myalgias.  Neurological:  Positive for headaches.     Social History   Socioeconomic History   Marital status: Divorced    Spouse name: Not on file   Number of children: 2   Years of education: Not on file   Highest education level: Associate degree: academic program  Occupational History   Occupation: RETIRED  Tobacco Use   Smoking status: Former    Packs/day: 0.25    Years: 50.00    Pack years: 12.50    Types: Cigarettes    Quit date: 08/28/2014    Years since quitting: 6.3   Smokeless tobacco: Never   Tobacco comments:    1 pack every 3 days  Vaping Use   Vaping Use: Never  used  Substance and Sexual Activity   Alcohol use: No    Alcohol/week: 0.0 standard drinks   Drug use: No   Sexual activity: Not Currently  Other Topics Concern   Not on file  Social History Narrative   Lives alone    Right handed   Caffeine: 3x weekly         Social Determinants of Health   Financial Resource Strain: Low Risk    Difficulty of Paying Living Expenses: Not hard at all  Food Insecurity: No Food Insecurity   Worried About Charity fundraiser in the Last Year: Never true   [place] in the Last Year: Never true  Transportation Needs: No Transportation Needs   Lack of Transportation (Medical): No   Lack of Transportation (Non-Medical): No  Physical Activity: Sufficiently Active   Days of Exercise per Week: 5 days   Minutes of Exercise per Session: 30 min  Stress: No Stress Concern Present   Feeling of Stress : Not at all  Social Connections: Moderately Integrated   Frequency of Communication with Friends and Family: More than three times a week   Frequency of Social Gatherings with Friends and Family: More than three times a week   Attends Religious Services: More than 4 times per year   Active Member of Clubs or Organizations: Yes  Attends Archivist Meetings: More than 4 times per year   Marital Status: Widowed     Observations/Objective: Appears well in NAD Breathing normally Skin appears warm and dry  Assessment and Plan:  See Problem List for Assessment and Plan of chronic medical problems.   Follow Up Instructions:    I discussed the assessment and treatment plan with the patient. The patient was provided an opportunity to ask questions and all were answered. The patient agreed with the plan and demonstrated an understanding of the instructions.   The patient was advised to call back or seek an in-person evaluation if the symptoms worsen or if the condition fails to improve as anticipated.    [name], MD

## 2020-12-17 ENCOUNTER — Telehealth: Payer: Self-pay | Admitting: Internal Medicine

## 2020-12-17 ENCOUNTER — Encounter: Payer: Self-pay | Admitting: Internal Medicine

## 2020-12-17 ENCOUNTER — Telehealth (INDEPENDENT_AMBULATORY_CARE_PROVIDER_SITE_OTHER): Payer: Medicare Other | Admitting: Internal Medicine

## 2020-12-17 DIAGNOSIS — J111 Influenza due to unidentified influenza virus with other respiratory manifestations: Secondary | ICD-10-CM | POA: Insufficient documentation

## 2020-12-17 MED ORDER — OSELTAMIVIR PHOSPHATE 75 MG PO CAPS: 75.0000 mg | ORAL_CAPSULE | Freq: Two times a day (BID) | ORAL | 0 refills | Status: AC

## 2020-12-17 MED ORDER — HYDROCODONE BIT-HOMATROP MBR 5-1.5 MG/5ML PO SOLN: 5.0000 mL | Freq: Four times a day (QID) | ORAL | 0 refills | Status: DC | PRN

## 2020-12-17 NOTE — Telephone Encounter (Signed)
Message left for patient.  If she calls back please have her use Good Rx card and pick up medication.

## 2020-12-17 NOTE — Assessment & Plan Note (Signed)
Acute Got flu from her granddaughter Symptoms mild- moderate Start tamiflu 75 mg bid x 5 days Tylenol, alternating with ibuprofen robutussin during the day Start hydromet cough syrup prn Rest, fluid - push fluids to avoid dehydration - advised monitoring BP given BP meds and risk for dehydration Call with questions

## 2020-12-17 NOTE — Telephone Encounter (Signed)
It looks like none of the prescription cough medications are covered by her insurance.  If she uses good Rx for the prescription I sent it should only cost a little over $11.

## 2020-12-17 NOTE — Telephone Encounter (Signed)
Patient states she was prescribed HYDROcodone bit-homatropine (HYDROMET) 5-1.5 MG/5ML syrup  Patient states rx is too expensive and is requesting an affordable alternative medication

## 2020-12-25 ENCOUNTER — Telehealth: Payer: Medicare Other

## 2020-12-26 NOTE — Progress Notes (Signed)
Error patient not seen

## 2020-12-30 DIAGNOSIS — Z0000 Encounter for general adult medical examination without abnormal findings: Secondary | ICD-10-CM | POA: Diagnosis not present

## 2020-12-30 DIAGNOSIS — M1990 Unspecified osteoarthritis, unspecified site: Secondary | ICD-10-CM | POA: Diagnosis not present

## 2020-12-30 DIAGNOSIS — M545 Low back pain, unspecified: Secondary | ICD-10-CM | POA: Diagnosis not present

## 2020-12-30 DIAGNOSIS — M810 Age-related osteoporosis without current pathological fracture: Secondary | ICD-10-CM | POA: Diagnosis not present

## 2020-12-30 DIAGNOSIS — I10 Essential (primary) hypertension: Secondary | ICD-10-CM | POA: Diagnosis not present

## 2020-12-31 ENCOUNTER — Telehealth: Payer: Medicare Other

## 2020-12-31 NOTE — Progress Notes (Deleted)
Chronic Care Management Pharmacy Note  12/31/2020 Name:  Kristina Ayala MRN:  [id number] DOB:  Aug 02, 1945  Summary: -Pt is overdue for Prolia injection (last done 07/2019) -Pt is not checking BP at home -Pt had a mechanical fall 06/29/20 in a restaurant bathroom, she did not hit her head and is feeling fine today.   Recommendations/Changes made from today's visit: -Get a BP cuff and monitor at home several times a week. Depending on home BP, may change amlodipine to 5 or 10 mg so pt doesn't have to use pill cutter anymore -Advised to get Covid booster -Switching to [facility] for pill packs/delivery -Pt needs refill for Alprazolam to [facility]   Subjective: Kristina Ayala is an 75 y.o. year old female who is a primary patient of Burns, Claudina Lick, MD.  The CCM team was consulted for assistance with disease management and care coordination needs.    Engaged with patient by telephone for follow up visit in response to provider referral for pharmacy case management and/or care coordination services.   Consent to Services:  The patient was given the following information about Chronic Care Management services today, agreed to services, and gave verbal consent: 1. CCM service includes personalized support from designated clinical staff supervised by the primary care provider, including individualized plan of care and coordination with other care providers 2. 24/7 contact phone numbers for assistance for urgent and routine care needs. 3. Service will only be billed when office clinical staff spend 20 minutes or more in a month to coordinate care. 4. Only one practitioner may furnish and bill the service in a calendar month. 5.The patient may stop CCM services at any time (effective at the end of the month) by phone call to the office staff. 6. The patient will be responsible for cost sharing (co-pay) of up to 20% of the service fee (after annual deductible is met). Patient agreed to services and  consent obtained.  Patient Care Team: [name], MD as PCP - General (Internal Medicine) Buford Dresser, MD as PCP - Cardiology (Cardiology) Lyndal Pulley, DO as Attending Physician (Family Medicine) Opthamology, [facility] as Consulting Physician (Ophthalmology) Charlton Haws, [facility] as Pharmacist (Pharmacist)  Recent office visits: 12/17/2020 - Dr. Quay Burow  - flu - to start tamiflu, robitussin, APAP and IBU 12/09/2020 - Dr. Quay Burow - BP well controlled, itchy scalp - continue zyrtec prn - to consider steroid shampoo if no relief  10/07/2020 - Dr. Quay Burow - URI evaluation - trial of hycodan cough syrup, fluticasone nasal spray  09/01/2020 - Dr. Quay Burow - eval of lung nodules - CT of chest ordered   Recent consult visits: 12/15/2020 - Dr. Charmian Muff - Pain medicine - note not available at this time  12/10/2020 - Dr. Tamala Julian - Sports Medicine - left knee pain - patient doing well after most recent injection - follow up in 3 months  10/22/2020 - Dr. Tamala Julian - sports medicine - kenalog injection given in left knee  08/21/2020 - Dr. Tamala Julian - sports medicine - PT recommended  - f/u in 8 weeks  08/04/2020 - Dr. Brett Fairy - Neurology - Sleep consult -  snoring - trial of melatonin - promotion of good sleep hygiene   Hospital visits: 07/13/2020 - to ED for evaluation of cough - chest x-ray negative for PNA - no changes to medications    Objective:  Lab Results  Component Value Date   CREATININE 0.76 09/01/2020   BUN 14 09/01/2020   GFR 76.09 09/01/2020  GFRNONAA >60 04/18/2017   GFRAA >60 04/18/2017   NA 141 09/01/2020   K 4.1 09/01/2020   CALCIUM 9.7 09/01/2020   CO2 30 09/01/2020   GLUCOSE 99 09/01/2020    Lab Results  Component Value Date/Time   HGBA1C 6.1 09/01/2020 10:21 AM   HGBA1C 6.2 03/03/2020 02:30 PM   GFR 76.09 09/01/2020 10:21 AM   GFR 67.71 03/03/2020 02:30 PM    Last diabetic Eye exam:  Lab Results  Component Value Date/Time   HMDIABEYEEXA No Retinopathy  09/03/2020 12:00 AM    Last diabetic Foot exam: No results found for: HMDIABFOOTEX   Lab Results  Component Value Date   CHOL 173 09/01/2020   HDL 51.60 09/01/2020   LDLCALC 102 (H) 09/01/2020   LDLDIRECT 213.1 05/22/2012   TRIG 95.0 09/01/2020   CHOLHDL 3 09/01/2020    Hepatic Function Latest Ref Rng & Units 09/01/2020 03/03/2020 11/08/2019  Total Protein 6.0 - 8.3 g/dL 7.2 7.3 7.6  Albumin 3.5 - 5.2 g/dL 4.3 4.4 4.4  AST 0 - 37 U/L '19 24 19  ' ALT 0 - 35 U/L 28 32 25  Alk Phosphatase 39 - 117 U/L 102 61 42  Total Bilirubin 0.2 - 1.2 mg/dL 0.4 0.4 0.6  Bilirubin, Direct 0.0 - 0.3 mg/dL - - -    Lab Results  Component Value Date/Time   TSH 0.65 10/28/2017 02:52 PM   TSH 0.41 12/01/2016 02:51 PM   FREET4 0.89 05/22/2012 03:36 PM    CBC Latest Ref Rng & Units 03/03/2020 11/08/2019 07/11/2019  WBC 4.0 - 10.5 K/uL 8.3 8.3 7.9  Hemoglobin 12.0 - 15.0 g/dL 12.9 13.4 12.7  Hematocrit 36.0 - 46.0 % 39.3 41.2 38.7  Platelets 150.0 - 400.0 K/uL 315.0 313.0 258.0    Lab Results  Component Value Date/Time   VD25OH 61.30 09/01/2020 10:21 AM   VD25OH 111.87 (HH) 07/11/2019 10:02 AM    Clinical ASCVD: No  The 10-year ASCVD risk score (Arnett DK, et al., 2019) is: 55.3%   Values used to calculate the score:     Age: 57 years     Sex: Female     Is Non-Hispanic African American: Yes     Diabetic: Yes     Tobacco smoker: Yes     Systolic Blood Pressure: 696 mmHg     Is BP treated: Yes     HDL Cholesterol: 51.6 mg/dL     Total Cholesterol: 173 mg/dL    Depression screen [facility] 2/9 04/10/2020 01/11/2020 01/10/2019  Decreased Interest 0 0 0  Down, Depressed, Hopeless 0 0 0  PHQ - 2 Score 0 0 0  Altered sleeping - - -  Tired, decreased energy - - -  Change in appetite - - -  Feeling bad or failure about yourself  - - -  Trouble concentrating - - -  Moving slowly or fidgety/restless - - -  Suicidal thoughts - - -  PHQ-9 Score - - -  Difficult doing work/chores - - -  Some recent  data might be hidden      Social History   Tobacco Use  Smoking Status Former   Packs/day: 0.25   Years: 50.00   Pack years: 12.50   Types: Cigarettes   Quit date: 08/28/2014   Years since quitting: 6.3  Smokeless Tobacco Never  Tobacco Comments   1 pack every 3 days   BP Readings from Last 3 Encounters:  12/10/20 140/82  12/09/20 120/80  10/22/20 122/82  Pulse Readings from Last 3 Encounters:  12/10/20 86  12/09/20 91  10/22/20 92   Wt Readings from Last 3 Encounters:  12/10/20 164 lb (74.4 kg)  12/09/20 165 lb (74.8 kg)  10/22/20 166 lb (75.3 kg)   BMI Readings from Last 3 Encounters:  12/10/20 27.29 kg/m  12/09/20 27.46 kg/m  10/22/20 27.62 kg/m    Assessment/Interventions: Review of patient past medical history, allergies, medications, health status, including review of consultants reports, laboratory and other test data, was performed as part of comprehensive evaluation and provision of chronic care management services.   SDOH:  (Social Determinants of Health) assessments and interventions performed: Yes  SDOH Screenings   Alcohol Screen: Low Risk    Last Alcohol Screening Score (AUDIT): 0  Depression (PHQ2-9): Low Risk    PHQ-2 Score: 0  Financial Resource Strain: Low Risk    Difficulty of Paying Living Expenses: Not hard at all  Food Insecurity: No Food Insecurity   Worried About Charity fundraiser in the Last Year: Never true   Ran Out of Food in the Last Year: Never true  Housing: Low Risk    Last Housing Risk Score: 0  Physical Activity: Sufficiently Active   Days of Exercise per Week: 5 days   Minutes of Exercise per Session: 30 min  Social Connections: Moderately Integrated   Frequency of Communication with Friends and Family: More than three times a week   Frequency of Social Gatherings with Friends and Family: More than three times a week   Attends Religious Services: More than 4 times per year   Active Member of [employer] or Organizations:  Yes   Attends Archivist Meetings: More than 4 times per year   Marital Status: Widowed  Stress: No Stress Concern Present   Feeling of Stress : Not at all  Tobacco Use: Medium Risk   Smoking Tobacco Use: Former   Smokeless Tobacco Use: Never   Passive Exposure: Not on file  Transportation Needs: No Transportation Needs   Lack of Transportation (Medical): No   Lack of Transportation (Non-Medical): No    CCM Care Plan  Allergies  Allergen Reactions   Doxycycline Diarrhea   Minocycline Diarrhea   Wellbutrin [Bupropion] Swelling    Per pt her tongue was swollen and sore/symptoms stopped once the medication was discontinued.    Penicillins Rash    Has patient had a PCN reaction causing immediate rash, facial/tongue/throat swelling, SOB or lightheadedness with hypotension: yes Has patient had a PCN reaction causing severe rash involving mucus membranes or skin necrosis: no Has patient had a PCN reaction that required hospitalization: unknown Has patient had a PCN reaction occurring within the last 10 years: no If all of the above answers are "NO", then may proceed with Cephalosporin use.    Z-Pak [Azithromycin] Other (See Comments)    diarrhea    Medications Reviewed Today     Reviewed by [name], MD (Physician) on 12/17/20 at 0854  Med List Status: <None>   Medication Order Taking? Sig Documenting Provider Last Dose Status Informant  acetaminophen (TYLENOL) 500 MG tablet [id number] No Take 500 mg by mouth every 6 (six) hours as needed for mild pain, fever or headache. [provider] Taking Active Self  ALPRAZolam (XANAX) 0.5 MG tablet [id number] No TAKE 1 TABLET(0.5 MG) BY MOUTH AT BEDTIME AS NEEDED FOR ANXIETY Burns, Claudina Lick, MD Taking Active   amLODipine (NORVASC) 5 MG tablet [id number] No TAKE 1 AND 1/2 TABLETS(7.5  MG) BY MOUTH DAILY [name], MD Taking Active   Camphor-Eucalyptus-Menthol [facility] [place]) [id number] No Apply 1 application  topically as needed (congestion). [provider] Taking Active Self  chlorhexidine (PERIDEX) 0.12 % solution [id number] No 15 mLs 2 (two) times daily. [provider] Taking Active   cholecalciferol (VITAMIN D) 25 MCG (1000 UNIT) tablet [id number] No Take 2,000 Units by mouth daily. [provider] Taking Active Self  Cyanocobalamin (VITAMIN B-12) 2500 MCG SUBL [id number] No Place 2,500 mcg under the tongue daily. [provider] Taking Active Self  denosumab (PROLIA) 60 MG/ML SOSY injection [id number] No INJECT 60 MG INTO THE SKIN ONCE FOR 1 DOSE  Patient taking differently: Inject 60 mg into the skin every 6 (six) months.   [name], MD Taking Active            Med Note Teryl Lucy Jul 13, 2020  8:44 AM) Due beginning of July  fluticasone [facility]) 50 MCG/ACT nasal spray [id number] No Place 2 sprays into both nostrils daily. [name], MD Taking Active   gabapentin (NEURONTIN) 100 MG capsule [id number] No Take 2 capsules (200 mg total) by mouth at bedtime.  Patient taking differently: Take 200 mg by mouth at bedtime as needed (neuropathy).   Lyndal Pulley, DO Taking Active   guaifenesin (ROBITUSSIN) 100 MG/5ML syrup [id number] No Take 200 mg by mouth 3 (three) times daily as needed for cough. [provider] Taking Active Self  hydrochlorothiazide (HYDRODIURIL) 25 MG tablet [id number] No TAKE 1 TABLET(25 MG) BY MOUTH EVERY MORNING Burns, Claudina Lick, MD Taking Active   lisinopril (ZESTRIL) 40 MG tablet [id number] No TAKE 1 TABLET(40 MG) BY MOUTH DAILY  Patient taking differently: Take 40 mg by mouth daily.   [name], MD Taking Active   potassium chloride (KLOR-CON) 8 MEQ tablet [id number] No TAKE 2 TABLETS(16 MEQ) BY MOUTH DAILY  Patient taking differently: Take 16 mEq by mouth daily.   [name], MD Taking Active   TURMERIC PO [id number] No Take 1 tablet by mouth daily. [provider] Taking Active Self            Med Note Teryl Lucy Jul 13, 2020  8:42 AM) Needs refill            Patient Active Problem List   Diagnosis Date Noted   Influenza 12/17/2020   ETD (Eustachian tube dysfunction), right 12/09/2020   Itchy scalp 12/09/2020   Loud snoring 11/03/2020   Excessive daytime sleepiness 11/03/2020   Coronary artery calcification seen on CAT scan 09/09/2020   Aortic atherosclerosis ([place]) 08/31/2020   Right rib fracture 07/10/2020   Left ankle injury 07/10/2020   Closed low lateral malleolus fracture 07/10/2020   Snoring 05/23/2020   Cough 12/04/2019   Pancreatic mass 11/08/2019   DOE (dyspnea on exertion) 03/20/2019   Chronic left SI joint pain 03/13/2019   Leg cramping 01/10/2019   Osteoarthritis 01/10/2019   Right lower quadrant abdominal pain 11/09/2018   Adnexal mass, right 11/09/2018   Lung nodules - stable - no f/u needed 06/19/2018   Thyromegaly 10/28/2017   Restless leg syndrome 10/12/2017   Greater trochanteric bursitis of left hip 07/26/2017   Hypokalemia 04/18/2017   Degenerative arthritis of left knee 12/01/2016   Anxiety 12/01/2016   Diabetes mellitus without complication ([place]) 76/28/3151   Bursitis of right shoulder 09/04/2015   Lateral meniscus derangement 06/03/2015   Tear of  LCL (lateral collateral ligament) of knee 06/03/2015   Lumbar radiculopathy 09/19/2014   Low back pain 01/04/2014   Bilateral knee pain 01/04/2014   Diverticulosis of colon without hemorrhage 07/26/2013   Trigger thumb of left hand 03/06/2013   Essential hypertension, benign 02/15/2013   Hyperlipidemia 09/28/2012   COPD (chronic obstructive pulmonary disease) ([place]) 05/22/2012   Osteoporosis 05/22/2012    Immunization History  Administered Date(s) Administered   Fluad Quad(high Dose 65+) 10/18/2018, 10/07/2020   Influenza, High Dose Seasonal PF 11/10/2016, 10/28/2017, 12/28/2019   Influenza,inj,Quad PF,6+ Mos 11/12/2013, 11/27/2014   Influenza-Unspecified 11/26/2015    PFIZER(Purple Top)SARS-COV-2 Vaccination 03/19/2019, 04/09/2019   Pneumococcal Conjugate-13 04/30/2015   Pneumococcal Polysaccharide-23 01/04/2014   Tdap 07/29/2014    Conditions to be addressed/monitored:  Hypertension, Hyperlipidemia, Anxiety, Osteoporosis, Osteoarthritis, and Prediabetes  There are no care plans that you recently modified to display for this patient.     Medication Assistance: None required.  Patient affirms current coverage meets needs.  Compliance/Adherence/Medication fill history: Care Gaps: Foot exam Shingrix  Covid booster (dose #3) - due 09/09/19  Star-Rating Drugs: Atorvastatin - LF 03/12/20 90 ds (overdue) Lisinopril - LF 06/05/20 90 ds  Patient's preferred pharmacy is:  [id number] [address], [place] [address] Phone: [phone] Fax: [phone]  Uses pill box? Yes Pt endorses 90% compliance  We discussed: Verbal consent obtained for [facility] enhanced pharmacy services (medication synchronization, adherence packaging, delivery coordination). A medication sync plan was created to allow patient to get all medications delivered once every 30 to 90 days per patient preference. Patient understands they have freedom to choose pharmacy and clinical pharmacist will coordinate care between all prescribers and [facility]. Patient decided to: Utilize [facility] for medication synchronization, packaging and delivery  Care Plan and Follow Up Patient Decision:  Patient agrees to Care Plan and Follow-up.  Plan: Telephone follow up appointment with care management team member scheduled for:  *** months  ***  Chart prep - 12 minutes

## 2021-01-05 ENCOUNTER — Other Ambulatory Visit: Payer: Self-pay | Admitting: *Deleted

## 2021-01-05 DIAGNOSIS — M5416 Radiculopathy, lumbar region: Secondary | ICD-10-CM

## 2021-01-14 DIAGNOSIS — Z23 Encounter for immunization: Secondary | ICD-10-CM | POA: Diagnosis not present

## 2021-01-14 DIAGNOSIS — M810 Age-related osteoporosis without current pathological fracture: Secondary | ICD-10-CM | POA: Diagnosis not present

## 2021-01-14 DIAGNOSIS — Z79899 Other long term (current) drug therapy: Secondary | ICD-10-CM | POA: Diagnosis not present

## 2021-01-14 DIAGNOSIS — M545 Low back pain, unspecified: Secondary | ICD-10-CM | POA: Diagnosis not present

## 2021-01-14 DIAGNOSIS — Z0001 Encounter for general adult medical examination with abnormal findings: Secondary | ICD-10-CM | POA: Diagnosis not present

## 2021-01-14 DIAGNOSIS — I10 Essential (primary) hypertension: Secondary | ICD-10-CM | POA: Diagnosis not present

## 2021-01-19 ENCOUNTER — Other Ambulatory Visit: Payer: Self-pay | Admitting: Internal Medicine

## 2021-02-02 ENCOUNTER — Telehealth: Payer: Self-pay

## 2021-02-02 NOTE — Telephone Encounter (Signed)
[employer] Rep calling in for pt. Dr. Quay Burow prescribed rosuvastatin (CRESTOR) 10 MG tablet In the place of  atorvastatin (LIPITOR) 40 MG tablet I advised the Rep that it is showing that both Rx are discontinued. Pt started taking Lipitor 40mg  again and only has about 2 weeks worth left. If Dr. Quay Burow would like her to continue that medication a Rx will need to be sent in per Rep. If she doesn't want pt taking this Rx then please call the Pt to update her [phone]

## 2021-02-05 ENCOUNTER — Other Ambulatory Visit: Payer: Self-pay

## 2021-02-05 MED ORDER — ROSUVASTATIN CALCIUM 10 MG PO TABS: 10.0000 mg | ORAL_TABLET | ORAL | 3 refills | Status: DC

## 2021-02-05 NOTE — Telephone Encounter (Signed)
Script sent in for patient today.  Previous script sent in for her take medication 3 times a week due to joint discomfort from previous conversation.  Attempted to reach patient but no VM set up.

## 2021-02-10 ENCOUNTER — Other Ambulatory Visit: Payer: Self-pay | Admitting: Internal Medicine

## 2021-02-10 NOTE — Progress Notes (Signed)
[facility] [place] [address] [place] Phone: [phone] Subjective:   Kristina Ayala, am serving as a scribe for Dr. Hulan Saas. This visit occurred during the SARS-CoV-2 public health emergency.  Safety protocols were in place, including screening questions prior to the visit, additional usage of staff PPE, and extensive cleaning of exam room while observing appropriate contact time as indicated for disinfecting solutions.   I'm seeing this patient by the request  of:  [name], MD  CC: Back pain, knee pain  [id number]  12/10/2020 Stable at the moment.  No significant changes in management.  Patient is doing well after the injection at this time and wants to hold on any other injections.  States that she thinks she can make it potentially 5 months before needing 1.  We discussed with patient that if any increase in stability or inflammation to see [place] sooner.  We will continue to monitor otherwise.  Follow-up again in 3 months  Update 02/11/2021 Kristina Ayala is a 76 y.o. female coming in with complaint of L knee and LBP. Last epidural June 2022. Patient states epidural lasted a while, new bed helped. Pain is in lower back and hip and will radiated down left leg. Pain sometimes tingle. Pain is apparent most of the time. Can't stand for long period of time. Will take a tylenol every now and again and uses Voltaren and icy hot. Left knee does give out and wants injection.       Past Medical History:  Diagnosis Date   Arthritis    Bursitis    left elbow   Clostridium difficile infection    Diabetes mellitus without complication (HCC)    Frequent falls    HLD (hyperlipidemia)    Hypertension    Infectious colitis    Past Surgical History:  Procedure Laterality Date   CATARACT EXTRACTION Bilateral    COLONOSCOPY     ORIF PROXIMAL TIBIAL PLATEAU FRACTURE Left 02/19/2012   knee   POLYPECTOMY     TONSILLECTOMY      Social History   Socioeconomic History   Marital status: Divorced    Spouse name: Not on file   Number of children: 2   Years of education: Not on file   Highest education level: Associate degree: academic program  Occupational History   Occupation: RETIRED  Tobacco Use   Smoking status: Former    Packs/day: 0.25    Years: 50.00    Pack years: 12.50    Types: Cigarettes    Quit date: 08/28/2014    Years since quitting: 6.4   Smokeless tobacco: Never   Tobacco comments:    1 pack every 3 days  Vaping Use   Vaping Use: Never used  Substance and Sexual Activity   Alcohol use: No    Alcohol/week: 0.0 standard drinks   Drug use: No   Sexual activity: Not Currently  Other Topics Concern   Not on file  Social History Narrative   Lives alone    Right handed   Caffeine: 3x weekly         Social Determinants of Health   Financial Resource Strain: Not on file  Food Insecurity: Not on file  Transportation Needs: Not on file  Physical Activity: Not on file  Stress: Not on file  Social Connections: Not on file   Allergies  Allergen Reactions   Doxycycline Diarrhea   Minocycline Diarrhea   Wellbutrin [Bupropion] Swelling  Per pt her tongue was swollen and sore/symptoms stopped once the medication was discontinued.    Penicillins Rash    Has patient had a PCN reaction causing immediate rash, facial/tongue/throat swelling, SOB or lightheadedness with hypotension: yes Has patient had a PCN reaction causing severe rash involving mucus membranes or skin necrosis: no Has patient had a PCN reaction that required hospitalization: unknown Has patient had a PCN reaction occurring within the last 10 years: no If all of the above answers are "NO", then may proceed with Cephalosporin use.    Z-Pak [Azithromycin] Other (See Comments)    diarrhea   Family History  Problem Relation Age of Onset   Diverticulosis Mother    Ovarian cancer Mother    Pancreatic cancer Father     Lung cancer Father    Colon polyps Sister    Colon cancer Neg Hx     Current Outpatient Medications (Endocrine & Metabolic):    denosumab (PROLIA) 60 MG/ML SOSY injection, INJECT 60 MG INTO THE SKIN ONCE FOR 1 DOSE (Patient taking differently: Inject 60 mg into the skin every 6 (six) months.)  Current Outpatient Medications (Cardiovascular):    amLODipine (NORVASC) 5 MG tablet, TAKE 1 AND 1/2 TABLETS(7.5 MG) BY MOUTH DAILY   hydrochlorothiazide (HYDRODIURIL) 25 MG tablet, TAKE 1 TABLET(25 MG) BY MOUTH EVERY MORNING   lisinopril (ZESTRIL) 40 MG tablet, TAKE 1 TABLET(40 MG) BY MOUTH DAILY   rosuvastatin (CRESTOR) 10 MG tablet, Take 1 tablet (10 mg total) by mouth 3 (three) times a week.  Current Outpatient Medications (Respiratory):    Camphor-Eucalyptus-Menthol (VICKS VAPORUB EX), Apply 1 application topically as needed (congestion).   fluticasone (FLONASE) 50 MCG/ACT nasal spray, Place 2 sprays into both nostrils daily.   guaifenesin (ROBITUSSIN) 100 MG/5ML syrup, Take 200 mg by mouth 3 (three) times daily as needed for cough.   HYDROcodone bit-homatropine (HYDROMET) 5-1.5 MG/5ML syrup, Take 5 mLs by mouth every 6 (six) hours as needed for cough.  Current Outpatient Medications (Analgesics):    acetaminophen (TYLENOL) 500 MG tablet, Take 500 mg by mouth every 6 (six) hours as needed for mild pain, fever or headache.  Current Outpatient Medications (Hematological):    Cyanocobalamin (VITAMIN B-12) 2500 MCG SUBL, Place 2,500 mcg under the tongue daily.  Current Outpatient Medications (Other):    ALPRAZolam (XANAX) 0.5 MG tablet, TAKE 1 TABLET(0.5 MG) BY MOUTH AT BEDTIME AS NEEDED FOR ANXIETY   chlorhexidine (PERIDEX) 0.12 % solution, 15 mLs 2 (two) times daily.   cholecalciferol (VITAMIN D) 25 MCG (1000 UNIT) tablet, Take 2,000 Units by mouth daily.   gabapentin (NEURONTIN) 100 MG capsule, Take 2 capsules (200 mg total) by mouth at bedtime. (Patient taking differently: Take 200 mg by  mouth at bedtime as needed (neuropathy).)   potassium chloride (KLOR-CON) 8 MEQ tablet, TAKE 2 TABLETS(16 MEQ) BY MOUTH DAILY (Patient taking differently: Take 16 mEq by mouth daily.)   TURMERIC PO, Take 1 tablet by mouth daily.   Reviewed prior external information including notes and imaging from  primary care provider As well as notes that were available from care everywhere and other healthcare systems.  Past medical history, social, surgical and family history all reviewed in electronic medical record.  No pertanent information unless stated regarding to the chief complaint.   Review of Systems:  No headache, visual changes, nausea, vomiting, diarrhea, constipation, dizziness, abdominal pain, skin rash, fevers, chills, night sweats, weight loss, swollen lymph nodes, body aches, joint swelling, chest pain, shortness of breath,  mood changes. POSITIVE muscle aches  Objective  Blood pressure 130/84, pulse 97, height 5\' 5"  (1.651 m), weight 168 lb (76.2 kg), SpO2 95 %.   General: No apparent distress alert and oriented x3 mood and affect normal, dressed appropriately.  HEENT: Pupils equal, extraocular movements intact  Respiratory: Patient's speak in full sentences and does not appear short of breath  Cardiovascular: No lower extremity edema, non tender, no erythema  Gait normal with good balance and coordination.  MSK: Left leg exam does have some weakness compared to the right leg exam especially on the left plantarflexion.  Arthritic changes noted of the left knee.  Trace effusion noted of the patellofemoral joint.  Lacks last 5 degrees of extension. Low back exam tender to palpation in the paraspinal musculature.  Once again positive straight leg test with weakness noted.  After informed written and verbal consent, patient was seated on exam table. Left knee was prepped with alcohol swab and utilizing anterolateral approach, patient's left knee space was injected with 4:1  marcaine 0.5%:  Kenalog 40mg /dL. Patient tolerated the procedure well without immediate complications.    Impression and Recommendations:     The above documentation has been reviewed and is accurate and complete Lyndal Pulley, DO

## 2021-02-11 ENCOUNTER — Other Ambulatory Visit: Payer: Self-pay

## 2021-02-11 ENCOUNTER — Ambulatory Visit (INDEPENDENT_AMBULATORY_CARE_PROVIDER_SITE_OTHER): Payer: Medicare Other | Admitting: Family Medicine

## 2021-02-11 ENCOUNTER — Encounter: Payer: Self-pay | Admitting: Family Medicine

## 2021-02-11 VITALS — BP 130/84 | HR 97 | Ht 65.0 in | Wt 168.0 lb

## 2021-02-11 DIAGNOSIS — M1732 Unilateral post-traumatic osteoarthritis, left knee: Secondary | ICD-10-CM

## 2021-02-11 DIAGNOSIS — M5416 Radiculopathy, lumbar region: Secondary | ICD-10-CM

## 2021-02-11 NOTE — Patient Instructions (Addendum)
Injection today See you again in 8 weeks [place] [phone] Call Today  When we receive your results we will contact you.

## 2021-02-11 NOTE — Assessment & Plan Note (Signed)
Patient given injection and tolerated the procedure well, discussed icing regimen and home exercises.  Patient does have the arthritic changes that are quite severe.  Patient did have the postsurgical changes secondary to the tibial plateau change that has caused this to be somewhat worse.  We will continue to monitor.  Follow-up again in 2 months.

## 2021-02-11 NOTE — Assessment & Plan Note (Signed)
worsenign symptoms with possible mild weakness, no longer responding to injections, last MRI 2018-feel at this point that repeat imaging would be beneficial.  Patient's x-rays have shown that there has been some progression of the arthritic changes.  Depending on the MRI we may change what type of injections we do.  Also a good idea with patient having a pancreatic mass patient is scheduled to have an MRI again in the end of this year.  Patient will follow-up with me after the imaging to discuss further.

## 2021-02-20 ENCOUNTER — Ambulatory Visit (INDEPENDENT_AMBULATORY_CARE_PROVIDER_SITE_OTHER): Payer: Medicare Other

## 2021-02-20 ENCOUNTER — Other Ambulatory Visit: Payer: Self-pay

## 2021-02-20 VITALS — Ht 65.0 in | Wt 166.0 lb

## 2021-02-20 DIAGNOSIS — Z0000 Encounter for general adult medical examination without abnormal findings: Secondary | ICD-10-CM

## 2021-02-20 NOTE — Progress Notes (Addendum)
I connected with Kristina Ayala today by telephone and verified that I am speaking with the correct person using two identifiers. Location patient: home Location provider: work Persons participating in the virtual visit: patient, provider.   I discussed the limitations, risks, security and privacy concerns of performing an evaluation and management service by telephone and the availability of in person appointments. I also discussed with the patient that there may be a patient responsible charge related to this service. The patient expressed understanding and verbally consented to this telephonic visit.    Interactive audio and video telecommunications were attempted between this provider and patient, however failed, due to patient having technical difficulties OR patient did not have access to video capability.  We continued and completed visit with audio only.  Some vital signs may be absent or patient reported.   Time Spent with patient on telephone encounter: 40 minutes  Subjective:   Kristina Ayala is a 76 y.o. female who presents for Medicare Annual (Subsequent) preventive examination.  Review of Systems     Cardiac Risk Factors include: advanced age (>74men, >37 women);diabetes mellitus;dyslipidemia     Objective:    Today's Vitals   02/20/21 1450  Weight: 166 lb (75.3 kg)  Height: 5\' 5"  (1.651 m)   Body mass index is 27.62 kg/m.  Advanced Directives 02/20/2021 07/13/2020 04/10/2020 01/11/2020 12/14/2019 01/10/2019 06/01/2018  Does Patient Have a Medical Advance Directive? No No No No No No No  Does patient want to make changes to medical advance directive? - - - - - - -  Would patient like information on creating a medical advance directive? No - Patient declined - Yes (MAU/Ambulatory/Procedural Areas - Information given) No - Patient declined No - Patient declined Yes (ED - Information included in AVS) No - Patient declined    Current Medications  (verified) Outpatient Encounter Medications as of 02/20/2021  Medication Sig   acetaminophen (TYLENOL) 500 MG tablet Take 500 mg by mouth every 6 (six) hours as needed for mild pain, fever or headache.   ALPRAZolam (XANAX) 0.5 MG tablet TAKE 1 TABLET(0.5 MG) BY MOUTH AT BEDTIME AS NEEDED FOR ANXIETY   amLODipine (NORVASC) 5 MG tablet TAKE 1 AND 1/2 TABLETS(7.5 MG) BY MOUTH DAILY (Patient taking differently: Take 5 mg by mouth daily. Take 2 tablets po daily for BP)   atorvastatin (LIPITOR) 40 MG tablet Take 40 mg by mouth daily.   Camphor-Eucalyptus-Menthol (VICKS VAPORUB EX) Apply 1 application topically as needed (congestion).   chlorhexidine (PERIDEX) 0.12 % solution 15 mLs 2 (two) times daily.   cholecalciferol (VITAMIN D) 25 MCG (1000 UNIT) tablet Take 2,000 Units by mouth daily.   Cyanocobalamin (VITAMIN B-12) 2500 MCG SUBL Place 2,500 mcg under the tongue daily.   fluticasone (FLONASE) 50 MCG/ACT nasal spray Place 2 sprays into both nostrils daily.   guaifenesin (ROBITUSSIN) 100 MG/5ML syrup Take 200 mg by mouth 3 (three) times daily as needed for cough.   hydrochlorothiazide (HYDRODIURIL) 25 MG tablet TAKE 1 TABLET(25 MG) BY MOUTH EVERY MORNING   HYDROcodone bit-homatropine (HYDROMET) 5-1.5 MG/5ML syrup Take 5 mLs by mouth every 6 (six) hours as needed for cough.   lisinopril (ZESTRIL) 40 MG tablet TAKE 1 TABLET(40 MG) BY MOUTH DAILY   potassium chloride (KLOR-CON) 8 MEQ tablet TAKE 2 TABLETS(16 MEQ) BY MOUTH DAILY (Patient taking differently: Take 16 mEq by mouth daily.)   TURMERIC PO Take 1 tablet by mouth daily.   denosumab (PROLIA) 60 MG/ML SOSY injection INJECT 60 MG  INTO THE SKIN ONCE FOR 1 DOSE (Patient taking differently: Inject 60 mg into the skin every 6 (six) months.)   gabapentin (NEURONTIN) 100 MG capsule Take 2 capsules (200 mg total) by mouth at bedtime. (Patient taking differently: Take 200 mg by mouth at bedtime as needed (neuropathy).)   [DISCONTINUED] rosuvastatin  (CRESTOR) 10 MG tablet Take 1 tablet (10 mg total) by mouth 3 (three) times a week. (Patient not taking: Reported on 02/20/2021)   No facility-administered encounter medications on file as of 02/20/2021.    Allergies (verified) Doxycycline, Minocycline, Wellbutrin [bupropion], Penicillins, and Z-pak [azithromycin]   History: Past Medical History:  Diagnosis Date   Arthritis    Bursitis    left elbow   Clostridium difficile infection    Diabetes mellitus without complication (HCC)    Frequent falls    HLD (hyperlipidemia)    Hypertension    Infectious colitis    Past Surgical History:  Procedure Laterality Date   CATARACT EXTRACTION Bilateral    COLONOSCOPY     ORIF PROXIMAL TIBIAL PLATEAU FRACTURE Left 02/19/2012   knee   POLYPECTOMY     TONSILLECTOMY     Family History  Problem Relation Age of Onset   Diverticulosis Mother    Ovarian cancer Mother    Pancreatic cancer Father    Lung cancer Father    Colon polyps Sister    Colon cancer Neg Hx    Social History   Socioeconomic History   Marital status: Divorced    Spouse name: Not on file   Number of children: 2   Years of education: Not on file   Highest education level: Associate degree: academic program  Occupational History   Occupation: RETIRED  Tobacco Use   Smoking status: Former    Packs/day: 0.25    Years: 50.00    Pack years: 12.50    Types: Cigarettes    Quit date: 08/28/2014    Years since quitting: 6.4   Smokeless tobacco: Never   Tobacco comments:    1 pack every 3 days  Vaping Use   Vaping Use: Never used  Substance and Sexual Activity   Alcohol use: No    Alcohol/week: 0.0 standard drinks   Drug use: No   Sexual activity: Not Currently  Other Topics Concern   Not on file  Social History Narrative   Lives alone    Right handed   Caffeine: 3x weekly         Social Determinants of Health   Financial Resource Strain: Low Risk    Difficulty of Paying Living Expenses: Not hard at all   Food Insecurity: No Food Insecurity   Worried About Charity fundraiser in the Last Year: Never true   Ran Out of Food in the Last Year: Never true  Transportation Needs: No Transportation Needs   Lack of Transportation (Medical): No   Lack of Transportation (Non-Medical): No  Physical Activity: Inactive   Days of Exercise per Week: 0 days   Minutes of Exercise per Session: 0 min  Stress: No Stress Concern Present   Feeling of Stress : Not at all  Social Connections: Moderately Integrated   Frequency of Communication with Friends and Family: More than three times a week   Frequency of Social Gatherings with Friends and Family: More than three times a week   Attends Religious Services: More than 4 times per year   Active Member of [employer] or Organizations: Yes   Attends Club or  Organization Meetings: More than 4 times per year   Marital Status: Widowed    Tobacco Counseling Counseling given: Not Answered Tobacco comments: 1 pack every 3 days   Clinical Intake:  Pre-visit preparation completed: Yes  Pain : No/denies pain     BMI - recorded: 27.62 Nutritional Status: BMI 25 -29 Overweight Nutritional Risks: None Diabetes: Yes CBG done?: No Did pt. bring in CBG monitor from home?: No  How often do you need to have someone help you when you read instructions, pamphlets, or other written materials from your doctor or pharmacy?: 1 - Never What is the last grade level you completed in school?: 2 years of college  Diabetic: yes  Interpreter Needed?: No  Information entered by :: Lisette Abu, LPN   Activities of Daily Living In your present state of health, do you have any difficulty performing the following activities: 02/20/2021  Hearing? N  Vision? N  Difficulty concentrating or making decisions? N  Walking or climbing stairs? N  Dressing or bathing? N  Doing errands, shopping? N  Preparing Food and eating ? N  Using the Toilet? N  In the past six months, have  you accidently leaked urine? N  Do you have problems with loss of bowel control? N  Managing your Medications? N  Managing your Finances? N  Housekeeping or managing your Housekeeping? N  Some recent data might be hidden    Patient Care Team: [name], MD as PCP - General (Internal Medicine) Buford Dresser, MD as PCP - Cardiology (Cardiology) Lyndal Pulley, DO as Attending Physician (Family Medicine) Opthamology, [facility] as Consulting Physician (Ophthalmology) Charlton Haws, [facility] as Pharmacist (Pharmacist)  Indicate any recent Medical Services you may have received from other than [facility] providers in the past year (date may be approximate).     Assessment:   This is a routine wellness examination for Kristina Ayala.  Hearing/Vision screen Hearing Screening - Comments:: Patient denied any hearing difficulty.   No hearing aids.  Vision Screening - Comments:: Patient wears corrective glasses/contacts.  Eye exam done annually by: [facility]  Dietary issues and exercise activities discussed: Current Exercise Habits: The patient does not participate in regular exercise at present, Exercise limited by: respiratory conditions(s)   Goals Addressed               This Visit's Progress     Patient Stated (pt-stated)        My goal is to get back in the gym to increase my physical activity.      Depression Screen PHQ 2/9 Scores 02/20/2021 04/10/2020 01/11/2020 01/10/2019 07/07/2018 07/14/2017 05/13/2017  PHQ - 2 Score 0 0 0 0 0 0 0  PHQ- 9 Score - - - - - 3 -    Fall Risk Fall Risk  02/20/2021 04/10/2020 01/11/2020 01/10/2019 07/07/2018  Falls in the past year? 0 0 0 1 1  Comment - - - - -  Number falls in past yr: 0 0 0 0 0  Injury with Fall? 0 - 0 1 1  Risk Factor Category  - - - - -  Risk for fall due to : No Fall Risks - No Fall Risks - -  Follow up Falls evaluation completed - Falls evaluation completed;Education provided - -  Comment - - - - -     FALL RISK PREVENTION PERTAINING TO THE HOME:  Any stairs in or around the home? Yes  If so, are there any without handrails? No  Home free of loose throw rugs in walkways, pet beds, electrical cords, etc? Yes  Adequate lighting in your home to reduce risk of falls? Yes   ASSISTIVE DEVICES UTILIZED TO PREVENT FALLS:  Life alert? No  Use of a cane, walker or w/c? Yes  Grab bars in the bathroom? Yes  Shower chair or bench in shower? No  Elevated toilet seat or a handicapped toilet? Yes   TIMED UP AND GO:  Was the test performed? No .  Length of time to ambulate 10 feet: n/a sec.   Gait slow and steady with assistive device  Cognitive Function: Normal cognitive status assessed by direct observation by this Nurse Health Advisor. No abnormalities found.       6CIT Screen 01/10/2019  What Year? 0 points  What month? 0 points  What time? 0 points  Count back from 20 0 points  Months in reverse 0 points  Repeat phrase 0 points  Total Score 0    Immunizations Immunization History  Administered Date(s) Administered   Fluad Quad(high Dose 65+) 10/18/2018, 10/07/2020   Influenza, High Dose Seasonal PF 11/10/2016, 10/28/2017, 12/28/2019   Influenza,inj,Quad PF,6+ Mos 11/12/2013, 11/27/2014   Influenza-Unspecified 11/26/2015   PFIZER(Purple Top)SARS-COV-2 Vaccination 03/19/2019, 04/09/2019, 11/19/2019   Pneumococcal Conjugate-13 04/30/2015   Pneumococcal Polysaccharide-23 01/04/2014   Tdap 07/29/2014   Zoster Recombinat (Shingrix) 02/20/2019, 09/13/2019    TDAP status: Up to date  Flu Vaccine status: Up to date  Pneumococcal vaccine status: Up to date  Covid-19 vaccine status: Completed vaccines  Qualifies for Shingles Vaccine? Yes   Zostavax completed No   Shingrix Completed?: Yes  Screening Tests Health Maintenance  Topic Date Due   FOOT EXAM  Never done   COVID-19 Vaccine (4 - Booster for Pfizer series) 01/14/2020   HEMOGLOBIN A1C  03/04/2021    OPHTHALMOLOGY EXAM  09/03/2021   DEXA SCAN  09/02/2022   TETANUS/TDAP  07/28/2024   Pneumonia Vaccine 55+ Years old  Completed   INFLUENZA VACCINE  Completed   Hepatitis C Screening  Completed   HPV VACCINES  Aged Out   COLONOSCOPY (Pts 45-63yrs Insurance coverage will need to be confirmed)  Discontinued   Zoster Vaccines- Shingrix  Discontinued    Health Maintenance  Health Maintenance Due  Topic Date Due   FOOT EXAM  Never done   COVID-19 Vaccine (4 - Booster for [place] series) 01/14/2020    Colorectal cancer screening: No longer required.   Mammogram status: Completed 08/23/2019. Repeat every year  Bone Density status: Completed 09/01/2020. Results reflect: Bone density results: OSTEOPOROSIS. Repeat every 2 years.  Lung Cancer Screening: (Low Dose CT Chest recommended if Age 73-80 years, 30 pack-year currently smoking OR have quit w/in 15years.) does not qualify.   Lung Cancer Screening Referral: no  Additional Screening:  Hepatitis C Screening: does qualify; Completed yes  Vision Screening: Recommended annual ophthalmology exams for early detection of glaucoma and other disorders of the eye. Is the patient up to date with their annual eye exam?  Yes  Who is the provider or what is the name of the office in which the patient attends annual eye exams? [facility] If pt is not established with a provider, would they like to be referred to a provider to establish care? No .   Dental Screening: Recommended annual dental exams for proper oral hygiene  Community Resource Referral / Chronic Care Management: CRR required this visit?  No   CCM required this visit?  No  Plan:     I have personally reviewed and noted the following in the patients chart:   Medical and social history Use of alcohol, tobacco or illicit drugs  Current medications and supplements including opioid prescriptions.  Functional ability and status Nutritional status Physical  activity Advanced directives List of other physicians Hospitalizations, surgeries, and ER visits in previous 12 months Vitals Screenings to include cognitive, depression, and falls Referrals and appointments  In addition, I have reviewed and discussed with patient certain preventive protocols, quality metrics, and best practice recommendations. A written personalized care plan for preventive services as well as general preventive health recommendations were provided to patient.     Sheral Flow, LPN   [id number]/[id number]   Nurse Notes:  Patient is cogitatively intact. There were no vitals filed for this visit. Patient provided weight and height.

## 2021-02-21 ENCOUNTER — Ambulatory Visit
Admission: RE | Admit: 2021-02-21 | Discharge: 2021-02-21 | Disposition: A | Discharge status: Home / Self Care | Payer: Medicare Other | Source: Ambulatory Visit | Attending: Family Medicine | Admitting: Family Medicine

## 2021-02-21 DIAGNOSIS — M5416 Radiculopathy, lumbar region: Secondary | ICD-10-CM

## 2021-02-21 DIAGNOSIS — M79605 Pain in left leg: Secondary | ICD-10-CM | POA: Diagnosis not present

## 2021-02-21 DIAGNOSIS — M545 Low back pain, unspecified: Secondary | ICD-10-CM | POA: Diagnosis not present

## 2021-02-21 DIAGNOSIS — M48061 Spinal stenosis, lumbar region without neurogenic claudication: Secondary | ICD-10-CM | POA: Diagnosis not present

## 2021-02-21 DIAGNOSIS — M5136 Other intervertebral disc degeneration, lumbar region: Secondary | ICD-10-CM | POA: Diagnosis not present

## 2021-02-26 ENCOUNTER — Telehealth: Payer: Self-pay | Admitting: Family Medicine

## 2021-02-26 ENCOUNTER — Other Ambulatory Visit: Payer: Self-pay

## 2021-02-26 DIAGNOSIS — M5416 Radiculopathy, lumbar region: Secondary | ICD-10-CM

## 2021-02-26 NOTE — Telephone Encounter (Signed)
Patient called stating that she would like to proceed with the Epidural Injection. Can this be ordered for her?

## 2021-02-26 NOTE — Telephone Encounter (Signed)
Epidural ordered and patient notified.  

## 2021-02-27 DIAGNOSIS — J988 Other specified respiratory disorders: Secondary | ICD-10-CM | POA: Diagnosis not present

## 2021-02-27 DIAGNOSIS — B9789 Other viral agents as the cause of diseases classified elsewhere: Secondary | ICD-10-CM | POA: Diagnosis not present

## 2021-03-04 ENCOUNTER — Other Ambulatory Visit: Payer: Self-pay

## 2021-03-04 ENCOUNTER — Encounter: Payer: Self-pay | Admitting: Internal Medicine

## 2021-03-04 ENCOUNTER — Ambulatory Visit (INDEPENDENT_AMBULATORY_CARE_PROVIDER_SITE_OTHER): Payer: Medicare Other | Admitting: Internal Medicine

## 2021-03-04 VITALS — BP 142/88 | HR 96 | Temp 98.6°F | Ht 65.0 in | Wt 165.4 lb

## 2021-03-04 DIAGNOSIS — J069 Acute upper respiratory infection, unspecified: Secondary | ICD-10-CM

## 2021-03-04 DIAGNOSIS — R197 Diarrhea, unspecified: Secondary | ICD-10-CM

## 2021-03-04 NOTE — Assessment & Plan Note (Signed)
Acute Associated with some leakage Had some constipation initially and did take a stool softener resulting in diarrhea Has taken Imodium on 1 occasion and Pepto-currently doing brat diet No bowel movement or leakage today Advised to try to eat normally-not too much of a brat diet, but no spicy foods or fried foods to try to get her stools back to normal Do not take any Imodium or Pepto until she knows what her next bowel movement is Call with questions or concerns

## 2021-03-04 NOTE — Patient Instructions (Signed)
° ° ° °  Try the flonase and continue the robitussin cough syrup.  Let me know if your symptoms are not improving.

## 2021-03-04 NOTE — Assessment & Plan Note (Signed)
Acute Cold symptoms started 1 week ago Having drainage, dry cough, some production on occasion with slightly firm tinged sputum She has been taking over-the-counter cold medications-not sure that they are really helping At this point is difficult to tell if her symptoms are viral or bacterial, but I think it may be viral in nature No antibiotic at this time Start Flonase, continue Robitussin cough syrup at night She will avoid other cold products and she may be having some side effects from some of them Rest, fluids She will call if her symptoms do not improve or resolve/worsen

## 2021-03-04 NOTE — Progress Notes (Signed)
Subjective:    Patient ID: Kristina Ayala, female    DOB: [birth year], 76 y.o.   MRN: [id number]  This visit occurred during the SARS-CoV-2 public health emergency.  Safety protocols were in place, including screening questions prior to the visit, additional usage of staff PPE, and extensive cleaning of exam room while observing appropriate contact time as indicated for disinfecting solutions.    HPI The patient is here for an acute visit.  Postnasal drainage going into the chest-symptoms started around 2/1.     She has PND going into the chest.  She has a rattle in her chest.  She has been taking otc colds meds w/o improvement.  She has mucinex, coricidin, Robitussin.  She thinks with all of the cold medications that may be interfering with her bowel movements as well  She has constipation.  She took a stool softener.  She developed diarrhea.  She then took an imodium last week.  She has been having loose stools and weak-, which is very disturbing.  Took two pepto bismol last night, bananas, toast, apple sauce last night.  No leakage this morning.  She wondered if some of this was related to stress.   Medications and allergies reviewed with patient and updated if appropriate.  Patient Active Problem List   Diagnosis Date Noted   Influenza 12/17/2020   ETD (Eustachian tube dysfunction), right 12/09/2020   Itchy scalp 12/09/2020   Loud snoring 11/03/2020   Excessive daytime sleepiness 11/03/2020   Coronary artery calcification seen on CAT scan 09/09/2020   Aortic atherosclerosis ([place]) 08/31/2020   Right rib fracture 07/10/2020   Left ankle injury 07/10/2020   Closed low lateral malleolus fracture 07/10/2020   Snoring 05/23/2020   Cough 12/04/2019   Pancreatic mass 11/08/2019   DOE (dyspnea on exertion) 03/20/2019   Chronic left SI joint pain 03/13/2019   Leg cramping 01/10/2019   Osteoarthritis 01/10/2019   Right lower quadrant abdominal pain 11/09/2018   Adnexal  mass, right 11/09/2018   Lung nodules - stable - no f/u needed 06/19/2018   Thyromegaly 10/28/2017   Restless leg syndrome 10/12/2017   Greater trochanteric bursitis of left hip 07/26/2017   Hypokalemia 04/18/2017   Degenerative arthritis of left knee 12/01/2016   Anxiety 12/01/2016   Diabetes mellitus without complication ([place]) 99/24/2683   Bursitis of right shoulder 09/04/2015   Lateral meniscus derangement 06/03/2015   Tear of LCL (lateral collateral ligament) of knee 06/03/2015   Lumbar radiculopathy 09/19/2014   Low back pain 01/04/2014   Bilateral knee pain 01/04/2014   Diverticulosis of colon without hemorrhage 07/26/2013   Trigger thumb of left hand 03/06/2013   Essential hypertension, benign 02/15/2013   Hyperlipidemia 09/28/2012   COPD (chronic obstructive pulmonary disease) ([place]) 05/22/2012   Osteoporosis 05/22/2012    Current Outpatient Medications on File Prior to Visit  Medication Sig Dispense Refill   acetaminophen (TYLENOL) 500 MG tablet Take 500 mg by mouth every 6 (six) hours as needed for mild pain, fever or headache.     ALPRAZolam (XANAX) 0.5 MG tablet TAKE 1 TABLET(0.5 MG) BY MOUTH AT BEDTIME AS NEEDED FOR ANXIETY 30 tablet 0   amLODipine (NORVASC) 5 MG tablet TAKE 1 AND 1/2 TABLETS(7.5 MG) BY MOUTH DAILY (Patient taking differently: Take 5 mg by mouth daily. Take 2 tablets po daily for BP) 135 tablet 1   atorvastatin (LIPITOR) 40 MG tablet Take 40 mg by mouth daily.     Camphor-Eucalyptus-Menthol (VICKS VAPORUB EX) Apply  1 application topically as needed (congestion).     chlorhexidine (PERIDEX) 0.12 % solution 15 mLs 2 (two) times daily.     cholecalciferol (VITAMIN D) 25 MCG (1000 UNIT) tablet Take 2,000 Units by mouth daily.     Cyanocobalamin (VITAMIN B-12) 2500 MCG SUBL Place 2,500 mcg under the tongue daily.     denosumab (PROLIA) 60 MG/ML SOSY injection INJECT 60 MG INTO THE SKIN ONCE FOR 1 DOSE (Patient taking differently: Inject 60 mg into the skin  every 6 (six) months.) 1 mL 0   fluticasone (FLONASE) 50 MCG/ACT nasal spray Place 2 sprays into both nostrils daily. 16 g 6   gabapentin (NEURONTIN) 100 MG capsule Take 2 capsules (200 mg total) by mouth at bedtime. (Patient taking differently: Take 200 mg by mouth at bedtime as needed (neuropathy).) 180 capsule 0   hydrochlorothiazide (HYDRODIURIL) 25 MG tablet TAKE 1 TABLET(25 MG) BY MOUTH EVERY MORNING 90 tablet 3   lisinopril (ZESTRIL) 40 MG tablet TAKE 1 TABLET(40 MG) BY MOUTH DAILY 90 tablet 1   potassium chloride (KLOR-CON) 8 MEQ tablet TAKE 2 TABLETS(16 MEQ) BY MOUTH DAILY (Patient taking differently: Take 16 mEq by mouth daily.) 180 tablet 1   TURMERIC PO Take 1 tablet by mouth daily.     No current facility-administered medications on file prior to visit.    Past Medical History:  Diagnosis Date   Arthritis    Bursitis    left elbow   Clostridium difficile infection    Diabetes mellitus without complication (HCC)    Frequent falls    HLD (hyperlipidemia)    Hypertension    Infectious colitis     Past Surgical History:  Procedure Laterality Date   CATARACT EXTRACTION Bilateral    COLONOSCOPY     ORIF PROXIMAL TIBIAL PLATEAU FRACTURE Left 02/19/2012   knee   POLYPECTOMY     TONSILLECTOMY      Social History   Socioeconomic History   Marital status: Divorced    Spouse name: Not on file   Number of children: 2   Years of education: Not on file   Highest education level: Associate degree: academic program  Occupational History   Occupation: RETIRED  Tobacco Use   Smoking status: Former    Packs/day: 0.25    Years: 50.00    Pack years: 12.50    Types: Cigarettes    Quit date: 08/28/2014    Years since quitting: 6.5   Smokeless tobacco: Never   Tobacco comments:    1 pack every 3 days  Vaping Use   Vaping Use: Never used  Substance and Sexual Activity   Alcohol use: No    Alcohol/week: 0.0 standard drinks   Drug use: No   Sexual activity: Not Currently   Other Topics Concern   Not on file  Social History Narrative   Lives alone    Right handed   Caffeine: 3x weekly         Social Determinants of Health   Financial Resource Strain: Low Risk    Difficulty of Paying Living Expenses: Not hard at all  Food Insecurity: No Food Insecurity   Worried About Charity fundraiser in the Last Year: Never true   Ran Out of Food in the Last Year: Never true  Transportation Needs: No Transportation Needs   Lack of Transportation (Medical): No   Lack of Transportation (Non-Medical): No  Physical Activity: Inactive   Days of Exercise per Week: 0 days  Minutes of Exercise per Session: 0 min  Stress: No Stress Concern Present   Feeling of Stress : Not at all  Social Connections: Moderately Integrated   Frequency of Communication with Friends and Family: More than three times a week   Frequency of Social Gatherings with Friends and Family: More than three times a week   Attends Religious Services: More than 4 times per year   Active Member of [employer] or Organizations: Yes   Attends Archivist Meetings: More than 4 times per year   Marital Status: Widowed    Family History  Problem Relation Age of Onset   Diverticulosis Mother    Ovarian cancer Mother    Pancreatic cancer Father    Lung cancer Father    Colon polyps Sister    Colon cancer Neg Hx     Review of Systems  Constitutional:  Negative for chills and fever.  HENT:  Positive for postnasal drip. Negative for congestion, ear pain, sinus pain and sore throat.   Respiratory:  Positive for cough (dry, mild). Negative for shortness of breath and wheezing.   Gastrointestinal:  Positive for diarrhea. Negative for abdominal pain and nausea.  Neurological:  Negative for dizziness and headaches.      Objective:   Vitals:   03/04/21 0804  BP: (!) 142/88  Pulse: 96  Temp: 98.6 F (37 C)  SpO2: 98%   BP Readings from Last 3 Encounters:  03/04/21 (!) 142/88  02/11/21 130/84   12/10/20 140/82   Wt Readings from Last 3 Encounters:  03/04/21 165 lb 6.4 oz (75 kg)  02/20/21 166 lb (75.3 kg)  02/11/21 168 lb (76.2 kg)   Body mass index is 27.52 kg/m.   Physical Exam    GENERAL APPEARANCE: Appears stated age, well appearing, NAD EYES: conjunctiva clear, no icterus HENT: bilateral tympanic membranes and ear canals normal, oropharynx with no erythema or exudates, trachea midline, no cervical or supraclavicular lymphadenopathy LUNGS: Unlabored breathing, good air entry bilaterally, clear to auscultation without wheeze or crackles Abdomen: Soft, nontender, nondistended CARDIOVASCULAR: Normal S1,S2 , no edema SKIN: Warm, dry      Assessment & Plan:    See Problem List for Assessment and Plan of chronic medical problems.

## 2021-03-05 ENCOUNTER — Ambulatory Visit
Admission: RE | Admit: 2021-03-05 | Discharge: 2021-03-05 | Disposition: A | Discharge status: Home / Self Care | Payer: Medicare Other | Source: Ambulatory Visit | Attending: Family Medicine | Admitting: Family Medicine

## 2021-03-05 DIAGNOSIS — M47817 Spondylosis without myelopathy or radiculopathy, lumbosacral region: Secondary | ICD-10-CM | POA: Diagnosis not present

## 2021-03-05 DIAGNOSIS — M5416 Radiculopathy, lumbar region: Secondary | ICD-10-CM

## 2021-03-05 MED ORDER — IOPAMIDOL (ISOVUE-M 200) INJECTION 41%
1.0000 mL | Freq: Once | INTRAMUSCULAR | Status: AC
  Administered 2021-03-05: 1 mL via EPIDURAL

## 2021-03-05 MED ORDER — METHYLPREDNISOLONE ACETATE 40 MG/ML INJ SUSP (RADIOLOG
80.0000 mg | Freq: Once | INTRAMUSCULAR | Status: AC
  Administered 2021-03-05: 80 mg via EPIDURAL

## 2021-03-05 NOTE — Discharge Instructions (Signed)

## 2021-03-06 ENCOUNTER — Telehealth: Payer: Self-pay | Admitting: Internal Medicine

## 2021-03-06 MED ORDER — HYDROCODONE BIT-HOMATROP MBR 5-1.5 MG/5ML PO SOLN: 5.0000 mL | Freq: Three times a day (TID) | ORAL | 0 refills | Status: DC | PRN

## 2021-03-06 NOTE — Telephone Encounter (Signed)
Pt states she discussed being prescribed cough medicine at last ov w/ provider   Pt requesting a rx for the cough medicine, pt states she does not remember the name of the cough medicine recommended  [place], [place]

## 2021-03-09 ENCOUNTER — Telehealth: Payer: Self-pay

## 2021-03-09 ENCOUNTER — Other Ambulatory Visit: Payer: Self-pay

## 2021-03-09 DIAGNOSIS — I10 Essential (primary) hypertension: Secondary | ICD-10-CM

## 2021-03-09 MED ORDER — HYDROCHLOROTHIAZIDE 25 MG PO TABS: ORAL_TABLET | ORAL | 3 refills | Status: DC

## 2021-03-09 NOTE — Telephone Encounter (Signed)
Prolia VOB initiated via [email or web address]  Last OV:  Next OV:  Last Prolia inj: 09/17/20 Next Prolia inj DUE: 03/21/21

## 2021-03-10 NOTE — Progress Notes (Signed)
[facility] [place] [address] [place] Phone: [phone] Subjective:   IVilma Meckel, am serving as a scribe for Dr. Hulan Saas. This visit occurred during the SARS-CoV-2 public health emergency.  Safety protocols were in place, including screening questions prior to the visit, additional usage of staff PPE, and extensive cleaning of exam room while observing appropriate contact time as indicated for disinfecting solutions.   I'm seeing this patient by the request  of:  [name], MD  CC: Back and knee pain  NOB:[id number]  02/11/2021 Patient given injection and tolerated the procedure well, discussed icing regimen and home exercises.  Patient does have the arthritic changes that are quite severe.  Patient did have the postsurgical changes secondary to the tibial plateau change that has caused this to be somewhat worse.  We will continue to monitor.  Follow-up again in 2 months.  Update 03/11/2021 Kristina Ayala is a 76 y.o. female coming in with complaint of LBP and L knee pain. Epidural 03/05/2021. Patient states injection for the knee worked well. Feeling great. Feels like the epidural worked, not in the same amount of pain with ADLs. Doing well today. No new complaints.       Past Medical History:  Diagnosis Date   Arthritis    Bursitis    left elbow   Clostridium difficile infection    Diabetes mellitus without complication (HCC)    Frequent falls    HLD (hyperlipidemia)    Hypertension    Infectious colitis    Past Surgical History:  Procedure Laterality Date   CATARACT EXTRACTION Bilateral    COLONOSCOPY     ORIF PROXIMAL TIBIAL PLATEAU FRACTURE Left 02/19/2012   knee   POLYPECTOMY     TONSILLECTOMY     Social History   Socioeconomic History   Marital status: Divorced    Spouse name: Not on file   Number of children: 2   Years of education: Not on file   Highest education level: Associate degree: academic  program  Occupational History   Occupation: RETIRED  Tobacco Use   Smoking status: Former    Packs/day: 0.25    Years: 50.00    Pack years: 12.50    Types: Cigarettes    Quit date: 08/28/2014    Years since quitting: 6.5   Smokeless tobacco: Never   Tobacco comments:    1 pack every 3 days  Vaping Use   Vaping Use: Never used  Substance and Sexual Activity   Alcohol use: No    Alcohol/week: 0.0 standard drinks   Drug use: No   Sexual activity: Not Currently  Other Topics Concern   Not on file  Social History Narrative   Lives alone    Right handed   Caffeine: 3x weekly         Social Determinants of Health   Financial Resource Strain: Low Risk    Difficulty of Paying Living Expenses: Not hard at all  Food Insecurity: No Food Insecurity   Worried About Charity fundraiser in the Last Year: Never true   Ran Out of Food in the Last Year: Never true  Transportation Needs: No Transportation Needs   Lack of Transportation (Medical): No   Lack of Transportation (Non-Medical): No  Physical Activity: Inactive   Days of Exercise per Week: 0 days   Minutes of Exercise per Session: 0 min  Stress: No Stress Concern Present   Feeling of Stress :  Not at all  Social Connections: Moderately Integrated   Frequency of Communication with Friends and Family: More than three times a week   Frequency of Social Gatherings with Friends and Family: More than three times a week   Attends Religious Services: More than 4 times per year   Active Member of [employer] or Organizations: Yes   Attends Archivist Meetings: More than 4 times per year   Marital Status: Widowed   Allergies  Allergen Reactions   Doxycycline Diarrhea   Minocycline Diarrhea   Wellbutrin [Bupropion] Swelling    Per pt her tongue was swollen and sore/symptoms stopped once the medication was discontinued.    Penicillins Rash    Has patient had a PCN reaction causing immediate rash, facial/tongue/throat swelling,  SOB or lightheadedness with hypotension: yes Has patient had a PCN reaction causing severe rash involving mucus membranes or skin necrosis: no Has patient had a PCN reaction that required hospitalization: unknown Has patient had a PCN reaction occurring within the last 10 years: no If all of the above answers are "NO", then may proceed with Cephalosporin use.    Z-Pak [Azithromycin] Other (See Comments)    diarrhea   Family History  Problem Relation Age of Onset   Diverticulosis Mother    Ovarian cancer Mother    Pancreatic cancer Father    Lung cancer Father    Colon polyps Sister    Colon cancer Neg Hx     Current Outpatient Medications (Endocrine & Metabolic):    denosumab (PROLIA) 60 MG/ML SOSY injection, INJECT 60 MG INTO THE SKIN ONCE FOR 1 DOSE (Patient taking differently: Inject 60 mg into the skin every 6 (six) months.)  Current Outpatient Medications (Cardiovascular):    amLODipine (NORVASC) 5 MG tablet, TAKE 1 AND 1/2 TABLETS(7.5 MG) BY MOUTH DAILY (Patient taking differently: Take 5 mg by mouth daily. Take 2 tablets po daily for BP)   atorvastatin (LIPITOR) 40 MG tablet, Take 40 mg by mouth daily.   hydrochlorothiazide (HYDRODIURIL) 25 MG tablet, TAKE 1 TABLET(25 MG) BY MOUTH EVERY MORNING   lisinopril (ZESTRIL) 40 MG tablet, TAKE 1 TABLET(40 MG) BY MOUTH DAILY  Current Outpatient Medications (Respiratory):    Camphor-Eucalyptus-Menthol (VICKS VAPORUB EX), Apply 1 application topically as needed (congestion).   fluticasone (FLONASE) 50 MCG/ACT nasal spray, Place 2 sprays into both nostrils daily.   HYDROcodone bit-homatropine (HYCODAN) 5-1.5 MG/5ML syrup, Take 5 mLs by mouth every 8 (eight) hours as needed for cough.  Current Outpatient Medications (Analgesics):    acetaminophen (TYLENOL) 500 MG tablet, Take 500 mg by mouth every 6 (six) hours as needed for mild pain, fever or headache.  Current Outpatient Medications (Hematological):    Cyanocobalamin (VITAMIN B-12)  2500 MCG SUBL, Place 2,500 mcg under the tongue daily.  Current Outpatient Medications (Other):    ALPRAZolam (XANAX) 0.5 MG tablet, TAKE 1 TABLET(0.5 MG) BY MOUTH AT BEDTIME AS NEEDED FOR ANXIETY   chlorhexidine (PERIDEX) 0.12 % solution, 15 mLs 2 (two) times daily.   cholecalciferol (VITAMIN D) 25 MCG (1000 UNIT) tablet, Take 2,000 Units by mouth daily.   gabapentin (NEURONTIN) 100 MG capsule, Take 2 capsules (200 mg total) by mouth at bedtime. (Patient taking differently: Take 200 mg by mouth at bedtime as needed (neuropathy).)   potassium chloride (KLOR-CON) 8 MEQ tablet, TAKE 2 TABLETS(16 MEQ) BY MOUTH DAILY (Patient taking differently: Take 16 mEq by mouth daily.)   TURMERIC PO, Take 1 tablet by mouth daily.    Objective  Blood pressure 132/88, pulse 85, height 5\' 5"  (1.651 m), weight 166 lb (75.3 kg), SpO2 98 %.   General: No apparent distress alert and oriented x3 mood and affect normal, dressed appropriately.  HEENT: Pupils equal, extraocular movements intact  Respiratory: Patient's speak in full sentences and does not appear short of breath  Cardiovascular: No lower extremity edema, non tender, no erythema  Gait normal with good balance and coordination.  MSK: Patient back exam seems to be unremarkable at the moment.  Some tightness with straight leg test but nothing else.    Impression and Recommendations:     The above documentation has been reviewed and is accurate and complete Lyndal Pulley, DO

## 2021-03-11 ENCOUNTER — Encounter: Payer: Self-pay | Admitting: Family Medicine

## 2021-03-11 ENCOUNTER — Other Ambulatory Visit: Payer: Self-pay

## 2021-03-11 ENCOUNTER — Ambulatory Visit (INDEPENDENT_AMBULATORY_CARE_PROVIDER_SITE_OTHER): Payer: Medicare Other | Admitting: Family Medicine

## 2021-03-11 DIAGNOSIS — M5416 Radiculopathy, lumbar region: Secondary | ICD-10-CM

## 2021-03-11 DIAGNOSIS — M1732 Unilateral post-traumatic osteoarthritis, left knee: Secondary | ICD-10-CM

## 2021-03-11 NOTE — Patient Instructions (Signed)
Have a wonderful [birth year] Everything is looking good Start on that bucket list See you again in 3 months

## 2021-03-11 NOTE — Assessment & Plan Note (Signed)
Significant improvement with the knee injection as well.  No other significant changes.  Could possibly be a candidate for viscosupplementation but I see patient doing well with conservative therapy.

## 2021-03-11 NOTE — Assessment & Plan Note (Signed)
Improvement noted at this point.  We will continue with the conservative therapy.  Discussed icing regimen and home exercises.  Increase activity as tolerated.  Follow-up again in 3 months can always repeat epidurals if needed.

## 2021-03-23 ENCOUNTER — Other Ambulatory Visit: Payer: Self-pay | Admitting: Internal Medicine

## 2021-03-24 NOTE — Telephone Encounter (Signed)
Resubmitted

## 2021-03-24 NOTE — Telephone Encounter (Signed)
Pt ready for scheduling on or after 03/24/21  Out-of-pocket cost due at time of visit: $301  Primary: [facility] Medicare Prolia co-insurance: 20% (approximately $276) Admin fee co-insurance: $0  Secondary: n/a Prolia co-insurance:  Admin fee co-insurance:   Deductible: does not apply  Prior Auth: APPROVED PA# [id number] Valid: 03/24/21-03/24/22  ** This summary of benefits is an estimation of the patient's out-of-pocket cost. Exact cost may vary based on individual plan coverage.

## 2021-03-24 NOTE — Telephone Encounter (Signed)
Prior auth required for PROLIA  PA PROCESS DETAILS: Effective 01/25/2021 if the patient is new to Prolia, Prior authorization and Step Therapy are required & not on file. Please go to [email or web address] or call [phone] to initiate the prior authorization. For exception to the policy please visit [email or web address] and review Policy Number [id number] 

## 2021-03-24 NOTE — Telephone Encounter (Signed)
Prior auth approved PA# [id number]

## 2021-03-25 ENCOUNTER — Ambulatory Visit (INDEPENDENT_AMBULATORY_CARE_PROVIDER_SITE_OTHER): Payer: Medicare Other

## 2021-03-25 ENCOUNTER — Other Ambulatory Visit: Payer: Self-pay

## 2021-03-25 DIAGNOSIS — M810 Age-related osteoporosis without current pathological fracture: Secondary | ICD-10-CM

## 2021-03-26 DIAGNOSIS — M810 Age-related osteoporosis without current pathological fracture: Secondary | ICD-10-CM | POA: Diagnosis not present

## 2021-03-26 MED ORDER — DENOSUMAB 60 MG/ML ~~LOC~~ SOSY
60.0000 mg | PREFILLED_SYRINGE | Freq: Once | SUBCUTANEOUS | Status: AC
  Administered 2021-03-26: 60 mg via SUBCUTANEOUS

## 2021-03-26 NOTE — Progress Notes (Signed)
Pt was given Prolia w/o any complications. ?

## 2021-03-26 NOTE — Telephone Encounter (Signed)
Last Prolia inj 03/25/21 ?Next Prolia inj 09/26/21 ?

## 2021-03-27 ENCOUNTER — Other Ambulatory Visit: Payer: Self-pay

## 2021-03-27 ENCOUNTER — Encounter: Payer: Self-pay | Admitting: Internal Medicine

## 2021-03-27 ENCOUNTER — Ambulatory Visit (INDEPENDENT_AMBULATORY_CARE_PROVIDER_SITE_OTHER): Payer: Medicare Other | Admitting: Internal Medicine

## 2021-03-27 ENCOUNTER — Telehealth: Payer: Self-pay | Admitting: Internal Medicine

## 2021-03-27 VITALS — BP 138/80 | HR 76 | Temp 98.7°F | Ht 65.0 in | Wt 164.0 lb

## 2021-03-27 DIAGNOSIS — R202 Paresthesia of skin: Secondary | ICD-10-CM

## 2021-03-27 DIAGNOSIS — E119 Type 2 diabetes mellitus without complications: Secondary | ICD-10-CM | POA: Diagnosis not present

## 2021-03-27 DIAGNOSIS — R5383 Other fatigue: Secondary | ICD-10-CM | POA: Diagnosis not present

## 2021-03-27 DIAGNOSIS — I10 Essential (primary) hypertension: Secondary | ICD-10-CM | POA: Diagnosis not present

## 2021-03-27 DIAGNOSIS — R5382 Chronic fatigue, unspecified: Secondary | ICD-10-CM | POA: Insufficient documentation

## 2021-03-27 LAB — HEMOGLOBIN A1C: Hgb A1c MFr Bld: 6.3 % (ref 4.6–6.5)

## 2021-03-27 LAB — COMPREHENSIVE METABOLIC PANEL
ALT: 27 U/L (ref 0–35)
AST: 21 U/L (ref 0–37)
Albumin: 4.5 g/dL (ref 3.5–5.2)
Alkaline Phosphatase: 55 U/L (ref 39–117)
BUN: 17 mg/dL (ref 6–23)
CO2: 27 mEq/L (ref 19–32)
Calcium: 9.9 mg/dL (ref 8.4–10.5)
Chloride: 103 mEq/L (ref 96–112)
Creatinine, Ser: 0.88 mg/dL (ref 0.40–1.20)
GFR: 63.56 mL/min (ref >60.00)
Glucose, Bld: 76 mg/dL (ref 70–99)
Potassium: 3.5 mEq/L (ref 3.5–5.1)
Sodium: 141 mEq/L (ref 135–145)
Total Bilirubin: 0.5 mg/dL (ref 0.2–1.2)
Total Protein: 7.5 g/dL (ref 6.0–8.3)

## 2021-03-27 LAB — VITAMIN B12: Vitamin B-12: >1504 pg/mL — ABNORMAL HIGH (ref 211–911)

## 2021-03-27 LAB — CBC WITH DIFFERENTIAL/PLATELET
Basophils Absolute: 0.1 10*3/uL (ref 0.0–0.1)
Basophils Relative: 1.1 % (ref 0.0–3.0)
Eosinophils Absolute: 0.1 10*3/uL (ref 0.0–0.7)
Eosinophils Relative: 1.2 % (ref 0.0–5.0)
HCT: 41 % (ref 36.0–46.0)
Hemoglobin: 13.1 g/dL (ref 12.0–15.0)
Lymphocytes Relative: 33.7 % (ref 12.0–46.0)
Lymphs Abs: 2.1 10*3/uL (ref 0.7–4.0)
MCHC: 32 g/dL (ref 30.0–36.0)
MCV: 82.1 fl (ref 78.0–100.0)
Monocytes Absolute: 0.5 10*3/uL (ref 0.1–1.0)
Monocytes Relative: 8.7 % (ref 3.0–12.0)
Neutro Abs: 3.4 10*3/uL (ref 1.4–7.7)
Neutrophils Relative %: 55.3 % (ref 43.0–77.0)
Platelets: 297 10*3/uL (ref 150.0–400.0)
RBC: 4.99 Mil/uL (ref 3.87–5.11)
RDW: 16.3 % — ABNORMAL HIGH (ref 11.5–15.5)
WBC: 6.2 10*3/uL (ref 4.0–10.5)

## 2021-03-27 LAB — TSH: TSH: 0.62 u[IU]/mL (ref 0.35–5.50)

## 2021-03-27 NOTE — Assessment & Plan Note (Addendum)
Chronic ?BP well controlled ?Continue amlodipine 7.5 mg daily, HCTZ 25 mg daily, lisinopril 40 mg daily ? ?

## 2021-03-27 NOTE — Progress Notes (Signed)
? ? ?Subjective:  ? ? Patient ID: Kristina Ayala, female    DOB: July 05, 1945, 76 y.o.   MRN: [id number] ? ?This visit occurred during the SARS-CoV-2 public health emergency.  Safety protocols were in place, including screening questions prior to the visit, additional usage of staff PPE, and extensive cleaning of exam room while observing appropriate contact time as indicated for disinfecting solutions. ? ? ? ?HPI ?[name] is here for  ?Chief Complaint  ?Patient presents with  ? Fatigue  ? ? ?She feels sluggish and is dragging around.  She is not sure if it is the arthritis, her age - just turned 76.  She wonders about blood work to see if something is off.   ? ?She sleeps during the day sometimes.  She may go to bed  at 8 pm and wake up at 2 and get up and do things around the house, then go back to bed until 6:30 or so.   ? ?She denies other symptoms - she just does not have the energy and wanted to know what to do.   ? ?She is taking all of her medications as prescribed.   No new meds.  ? ? ?Medications and allergies reviewed with patient and updated if appropriate. ? ?Current Outpatient Medications on File Prior to Visit  ?Medication Sig Dispense Refill  ? acetaminophen (TYLENOL) 500 MG tablet Take 500 mg by mouth every 6 (six) hours as needed for mild pain, fever or headache.    ? ALPRAZolam (XANAX) 0.5 MG tablet TAKE 1 TABLET(0.5 MG) BY MOUTH AT BEDTIME AS NEEDED FOR ANXIETY 30 tablet 0  ? amLODipine (NORVASC) 5 MG tablet TAKE 1 AND 1/2 TABLETS(7.5 MG) BY MOUTH DAILY (Patient taking differently: Take 5 mg by mouth daily. Take 2 tablets po daily for BP) 135 tablet 1  ? atorvastatin (LIPITOR) 40 MG tablet TAKE 1 TABLET(40 MG) BY MOUTH DAILY. FOLLOW-UP APPOINTMENT WITH LABS ARE DUE 30 tablet 2  ? Camphor-Eucalyptus-Menthol (VICKS VAPORUB EX) Apply 1 application topically as needed (congestion).    ? chlorhexidine (PERIDEX) 0.12 % solution 15 mLs 2 (two) times daily.    ? cholecalciferol (VITAMIN D) 25 MCG  (1000 UNIT) tablet Take 2,000 Units by mouth daily.    ? Cyanocobalamin (VITAMIN B-12) 2500 MCG SUBL Place 2,500 mcg under the tongue daily.    ? denosumab (PROLIA) 60 MG/ML SOSY injection INJECT 60MG  INTO THE SKIN AS DIRECTED 1 mL 0  ? fluticasone (FLONASE) 50 MCG/ACT nasal spray Place 2 sprays into both nostrils daily. 16 g 6  ? gabapentin (NEURONTIN) 100 MG capsule Take 2 capsules (200 mg total) by mouth at bedtime. (Patient taking differently: Take 200 mg by mouth at bedtime as needed (neuropathy).) 180 capsule 0  ? hydrochlorothiazide (HYDRODIURIL) 25 MG tablet TAKE 1 TABLET(25 MG) BY MOUTH EVERY MORNING 90 tablet 3  ? lisinopril (ZESTRIL) 40 MG tablet TAKE 1 TABLET(40 MG) BY MOUTH DAILY 90 tablet 1  ? potassium chloride (KLOR-CON) 8 MEQ tablet TAKE 2 TABLETS(16 MEQ) BY MOUTH DAILY (Patient taking differently: Take 16 mEq by mouth daily.) 180 tablet 1  ? TURMERIC PO Take 1 tablet by mouth daily.    ? ?No current facility-administered medications on file prior to visit.  ? ? ?Review of Systems  ?Constitutional:  Positive for fatigue. Negative for appetite change (healthy diet).  ?HENT:  Positive for postnasal drip (at night - takes flonase).   ?Respiratory:  Negative for cough, shortness of breath and wheezing.   ?  Cardiovascular:  Negative for chest pain, palpitations and leg swelling.  ?Gastrointestinal:  Negative for abdominal pain, blood in stool, constipation, diarrhea and nausea.  ?     No gerd  ?Genitourinary:  Negative for dysuria.  ?Musculoskeletal:  Positive for arthralgias and back pain.  ?Neurological:  Positive for headaches (occ). Negative for light-headedness.  ?Psychiatric/Behavioral:  Positive for sleep disturbance (sleeps during the day and night - interrrupted sleep). Negative for dysphoric mood. The patient is not nervous/anxious.   ? ?   ?Objective:  ? ?Vitals:  ? 03/27/21 0912  ?BP: 138/80  ?Pulse: 76  ?Temp: 98.7 ?F (37.1 ?C)  ?SpO2: 98%  ? ?BP Readings from Last 3 Encounters:  ?03/27/21  138/80  ?03/11/21 132/88  ?03/05/21 (!) 153/73  ? ?Wt Readings from Last 3 Encounters:  ?03/27/21 164 lb (74.4 kg)  ?03/11/21 166 lb (75.3 kg)  ?03/04/21 165 lb 6.4 oz (75 kg)  ? ?Body mass index is 27.29 kg/m?. ? ?  ?Physical Exam ?Constitutional:   ?   General: She is not in acute distress. ?   Appearance: Normal appearance.  ?HENT:  ?   Head: Normocephalic and atraumatic.  ?Eyes:  ?   Conjunctiva/sclera: Conjunctivae normal.  ?Cardiovascular:  ?   Rate and Rhythm: Normal rate and regular rhythm.  ?   Heart sounds: Normal heart sounds. No murmur heard. ?Pulmonary:  ?   Effort: Pulmonary effort is normal. No respiratory distress.  ?   Breath sounds: Normal breath sounds. No wheezing.  ?Musculoskeletal:  ?   Cervical back: Neck supple.  ?   Right lower leg: No edema.  ?   Left lower leg: No edema.  ?Lymphadenopathy:  ?   Cervical: No cervical adenopathy.  ?Skin: ?   Findings: No rash.  ?Neurological:  ?   Mental Status: She is alert. Mental status is at baseline.  ?Psychiatric:     ?   Mood and Affect: Mood normal.     ?   Behavior: Behavior normal.  ? ?   ? ? ? ? ? ?Assessment & Plan:  ? ? ?See Problem List for Assessment and Plan of chronic medical problems.  ? ? ? ? ?

## 2021-03-27 NOTE — Patient Instructions (Addendum)
? ? ? ?  Blood work was ordered.      Medications changes include :   none     Return if symptoms worsen or fail to improve.  

## 2021-03-27 NOTE — Assessment & Plan Note (Signed)
Chronic ?Lab Results  ?Component Value Date  ? HGBA1C 6.1 09/01/2020  ? ?Sugars have been well controlled and I doubt that is causing any of your current symptoms ?We will check A1c ?diet controlled -continue diabetic diet, regular exercise ?

## 2021-03-27 NOTE — Assessment & Plan Note (Signed)
Acute ?States fatigue for no obvious reason ??  Related to recent viral illness, arthritis, recent birthday-none of them really make sense ?She has noticed increased pain when the weather changes, so some of this could be arthritic in nature ?We will check CBC, CMP, TSH, vitamin B12, A1c ?Encouraged her to work on improving her sleep so she has more 1 continuous section of sleep ?Continue supplements ?Continue regular exercise-increase if possible ?She will let me know if this does not improve ?

## 2021-03-27 NOTE — Telephone Encounter (Signed)
Pt checking status of 03-27-2021 lab results ? ?Pt requesting a c/b once results are resulted ?

## 2021-03-27 NOTE — Assessment & Plan Note (Signed)
Occasional tingling sensation ?We will check B12-she is taking B12 on a regular basis so I doubt a deficiency or that this is contributing to fatigue ?

## 2021-03-30 NOTE — Telephone Encounter (Signed)
Labs still pending and have not resulted yet. ?

## 2021-03-31 NOTE — Telephone Encounter (Signed)
Seen by patient Kristina Ayala on 03/30/2021  5:21 PM ?

## 2021-04-03 ENCOUNTER — Other Ambulatory Visit: Payer: Self-pay | Admitting: Internal Medicine

## 2021-04-03 DIAGNOSIS — Z1231 Encounter for screening mammogram for malignant neoplasm of breast: Secondary | ICD-10-CM

## 2021-04-09 ENCOUNTER — Other Ambulatory Visit: Payer: Self-pay

## 2021-04-13 ENCOUNTER — Ambulatory Visit
Admission: EM | Admit: 2021-04-13 | Discharge: 2021-04-13 | Disposition: A | Discharge status: Home / Self Care | Payer: Medicare Other | Attending: Emergency Medicine | Admitting: Emergency Medicine

## 2021-04-13 ENCOUNTER — Other Ambulatory Visit: Payer: Self-pay

## 2021-04-13 ENCOUNTER — Ambulatory Visit (INDEPENDENT_AMBULATORY_CARE_PROVIDER_SITE_OTHER): Payer: Medicare Other

## 2021-04-13 DIAGNOSIS — J189 Pneumonia, unspecified organism: Secondary | ICD-10-CM

## 2021-04-13 DIAGNOSIS — R059 Cough, unspecified: Secondary | ICD-10-CM | POA: Diagnosis not present

## 2021-04-13 DIAGNOSIS — R0989 Other specified symptoms and signs involving the circulatory and respiratory systems: Secondary | ICD-10-CM | POA: Diagnosis not present

## 2021-04-13 DIAGNOSIS — J329 Chronic sinusitis, unspecified: Secondary | ICD-10-CM

## 2021-04-13 DIAGNOSIS — J310 Chronic rhinitis: Secondary | ICD-10-CM | POA: Diagnosis not present

## 2021-04-13 DIAGNOSIS — R058 Other specified cough: Secondary | ICD-10-CM

## 2021-04-13 MED ORDER — GUAIFENESIN 400 MG PO TABS
ORAL_TABLET | ORAL | 0 refills | Status: DC
Start: 2021-04-13 — End: 2022-01-05

## 2021-04-13 MED ORDER — CEFDINIR 300 MG PO CAPS
300.0000 mg | ORAL_CAPSULE | Freq: Two times a day (BID) | ORAL | 0 refills | Status: AC
Start: 2021-04-13 — End: 2021-04-18

## 2021-04-13 MED ORDER — IPRATROPIUM BROMIDE 0.06 % NA SOLN
2.0000 | Freq: Four times a day (QID) | NASAL | 0 refills | Status: DC
Start: 2021-04-13 — End: 2022-09-01

## 2021-04-13 NOTE — ED Provider Notes (Addendum)
?[facility] ? ? ? ?CSN: [id number] ?Arrival date & time: 04/13/21  1045 ?  ? ?HISTORY  ? ?Chief Complaint  ?Patient presents with  ? Cough  ? Nasal Congestion  ? Headache  ? ?HPI ?Kristina Ayala is a 76 y.o. female. Patient reports visiting her son in [place] 10 days ago.  Patient states she stayed 2 nights and that there was a lot of pollen.  Patient states the bedroom she slept and also had event over her head and her son kept the windows open most of the time that she was there.  Patient states initially she began to have a sore, scratchy throat but soon after developed significant nasal congestion and a cough.  Patient states at this time, the cough has become productive of brown sputum.  Patient denies fever, body aches, chills, shortness of breath, blood-tinged sputum.  Patient states she has been taking home remedies for her symptoms including teas and broths which have not been effective. ? ?The history is provided by the patient.  ?Past Medical History:  ?Diagnosis Date  ? Arthritis   ? Bursitis   ? left elbow  ? Clostridium difficile infection   ? Diabetes mellitus without complication ([place])   ? Frequent falls   ? HLD (hyperlipidemia)   ? Hypertension   ? Infectious colitis   ? ?Patient Active Problem List  ? Diagnosis Date Noted  ? Tingling 03/27/2021  ? Fatigue 03/27/2021  ? ETD (Eustachian tube dysfunction), right 12/09/2020  ? Itchy scalp 12/09/2020  ? Excessive daytime sleepiness 11/03/2020  ? Coronary artery calcification seen on CAT scan 09/09/2020  ? Aortic atherosclerosis ([place]) 08/31/2020  ? Right rib fracture 07/10/2020  ? Left ankle injury 07/10/2020  ? Closed low lateral malleolus fracture 07/10/2020  ? Snoring 05/23/2020  ? Cough 12/04/2019  ? Pancreatic mass 11/08/2019  ? DOE (dyspnea on exertion) 03/20/2019  ? Chronic left SI joint pain 03/13/2019  ? Leg cramping 01/10/2019  ? Osteoarthritis 01/10/2019  ? Right lower quadrant abdominal pain 11/09/2018  ? Adnexal mass, right  11/09/2018  ? Lung nodules - stable - no f/u needed 06/19/2018  ? Thyromegaly 10/28/2017  ? Restless leg syndrome 10/12/2017  ? Greater trochanteric bursitis of left hip 07/26/2017  ? Hypokalemia 04/18/2017  ? Degenerative arthritis of left knee 12/01/2016  ? Anxiety 12/01/2016  ? Diabetes mellitus without complication ([place]) 84/53/6468  ? Viral upper respiratory tract infection 08/18/2016  ? Bursitis of right shoulder 09/04/2015  ? Lateral meniscus derangement 06/03/2015  ? Tear of LCL (lateral collateral ligament) of knee 06/03/2015  ? Lumbar radiculopathy 09/19/2014  ? Low back pain 01/04/2014  ? Bilateral knee pain 01/04/2014  ? Diverticulosis of colon without hemorrhage 07/26/2013  ? Diarrhea 07/26/2013  ? Trigger thumb of left hand 03/06/2013  ? Essential hypertension, benign 02/15/2013  ? Hyperlipidemia 09/28/2012  ? COPD (chronic obstructive pulmonary disease) ([place]) 05/22/2012  ? Osteoporosis 05/22/2012  ? ?Past Surgical History:  ?Procedure Laterality Date  ? CATARACT EXTRACTION Bilateral   ? COLONOSCOPY    ? ORIF PROXIMAL TIBIAL PLATEAU FRACTURE Left 02/19/2012  ? knee  ? POLYPECTOMY    ? TONSILLECTOMY    ? ?OB History   ?No obstetric history on file. ?  ? ?Home Medications   ? ?Prior to Admission medications   ?Medication Sig Start Date End Date Taking? Authorizing Provider  ?ALPRAZolam (XANAX) 0.5 MG tablet TAKE 1 TABLET(0.5 MG) BY MOUTH AT BEDTIME AS NEEDED FOR ANXIETY 01/20/21  Janith Lima, MD  ?amLODipine (NORVASC) 5 MG tablet TAKE 1 AND 1/2 TABLETS(7.5 MG) BY MOUTH DAILY ?Patient taking differently: Take 5 mg by mouth daily. Take 2 tablets po daily for BP 10/27/20   [name], MD  ?atorvastatin (LIPITOR) 40 MG tablet TAKE 1 TABLET(40 MG) BY MOUTH DAILY. FOLLOW-UP APPOINTMENT WITH LABS ARE DUE 03/23/21   [name], MD  ?Camphor-Eucalyptus-Menthol [facility] VAPORUB EX) Apply 1 application topically as needed (congestion).    [provider]  ?cholecalciferol (VITAMIN D) 25 MCG (1000  UNIT) tablet Take 2,000 Units by mouth daily.    [provider]  ?Cyanocobalamin (VITAMIN B-12) 2500 MCG SUBL Place 2,500 mcg under the tongue daily.    [provider]  ?denosumab (PROLIA) 60 MG/ML SOSY injection INJECT '60MG'$  INTO THE SKIN AS DIRECTED 03/23/21   [name], MD  ?fluticasone (FLONASE) 50 MCG/ACT nasal spray Place 2 sprays into both nostrils daily. 10/07/20   [name], MD  ?gabapentin (NEURONTIN) 100 MG capsule Take 2 capsules (200 mg total) by mouth at bedtime. ?Patient taking differently: Take 200 mg by mouth at bedtime as needed (neuropathy). 02/28/20   Lyndal Pulley, DO  ?hydrochlorothiazide (HYDRODIURIL) 25 MG tablet TAKE 1 TABLET(25 MG) BY MOUTH EVERY MORNING 03/09/21   [name], MD  ?lisinopril (ZESTRIL) 40 MG tablet TAKE 1 TABLET(40 MG) BY MOUTH DAILY 02/11/21   [name], MD  ?potassium chloride (KLOR-CON) 8 MEQ tablet TAKE 2 TABLETS(16 MEQ) BY MOUTH DAILY ?Patient taking differently: Take 16 mEq by mouth daily. 06/05/20   [name], MD  ?TURMERIC PO Take 1 tablet by mouth daily.    [provider]  ? ?Family History ?Family History  ?Problem Relation Age of Onset  ? Diverticulosis Mother   ? Ovarian cancer Mother   ? Pancreatic cancer Father   ? Lung cancer Father   ? Colon polyps Sister   ? Colon cancer Neg Hx   ? ?Social History ?Social History  ? ?Tobacco Use  ? Smoking status: Former  ?  Packs/day: 0.25  ?  Years: 50.00  ?  Pack years: 12.50  ?  Types: Cigarettes  ?  Quit date: 08/28/2014  ?  Years since quitting: 6.6  ? Smokeless tobacco: Never  ? Tobacco comments:  ?  1 pack every 3 days  ?Vaping Use  ? Vaping Use: Never used  ?Substance Use Topics  ? Alcohol use: No  ?  Alcohol/week: 0.0 standard drinks  ? Drug use: No  ? ?Allergies   ?Doxycycline, Minocycline, Wellbutrin [bupropion], Penicillins, and Z-pak [azithromycin] ? ?Review of Systems ?Review of Systems ?Pertinent findings noted in history of present illness.  ? ?Physical  Exam ?Triage Vital Signs ?ED Triage Vitals  ?Enc Vitals Group  ?   BP 11/21/20 0827 (!) 147/82  ?   Pulse Rate 11/21/20 0827 72  ?   Resp 11/21/20 0827 18  ?   Temp 11/21/20 0827 98.3 ?F (36.8 ?C)  ?   Temp Source 11/21/20 0827 Oral  ?   SpO2 11/21/20 0827 98 %  ?   Weight --   ?   Height --   ?   Head Circumference --   ?   Peak Flow --   ?   Pain Score 11/21/20 0826 5  ?   Pain Loc --   ?   Pain Edu? --   ?   Excl. in [place]? --   ?No  data found. ? ?Updated Vital Signs ?BP (!) 145/87 (BP Location: Left Arm)   Pulse 91   Temp 98.6 ?F (37 ?C) (Oral)   Resp 18   SpO2 97%  ? ?Physical Exam ?Vitals and nursing note reviewed.  ?Constitutional:   ?   General: She is not in acute distress. ?   Appearance: Normal appearance. She is not ill-appearing.  ?HENT:  ?   Head: Normocephalic and atraumatic.  ?   Jaw: There is normal jaw occlusion.  ?   Salivary Glands: Right salivary gland is not diffusely enlarged or tender. Left salivary gland is not diffusely enlarged or tender.  ?   Right Ear: Hearing, tympanic membrane, ear canal and external ear normal. No drainage. No middle ear effusion. There is no impacted cerumen. Tympanic membrane is not erythematous or bulging.  ?   Left Ear: Hearing, tympanic membrane, ear canal and external ear normal. No drainage.  No middle ear effusion. There is no impacted cerumen. Tympanic membrane is not erythematous or bulging.  ?   Nose: Congestion and rhinorrhea present. No nasal deformity, septal deviation or mucosal edema. Rhinorrhea is clear.  ?   Right Turbinates: Enlarged, swollen and pale.  ?   Left Turbinates: Enlarged, swollen and pale.  ?   Right Sinus: No maxillary sinus tenderness or frontal sinus tenderness.  ?   Left Sinus: No maxillary sinus tenderness or frontal sinus tenderness.  ?   Mouth/Throat:  ?   Lips: Pink. No lesions.  ?   Mouth: Mucous membranes are moist. No oral lesions.  ?   Tongue: No lesions. Tongue does not deviate from midline.  ?   Palate: No mass and  lesions.  ?   Pharynx: Oropharynx is clear. Uvula midline. No pharyngeal swelling, oropharyngeal exudate, posterior oropharyngeal erythema or uvula swelling.  ?   Tonsils: No tonsillar exudate. 0 on the right. 0 on the

## 2021-04-13 NOTE — ED Triage Notes (Addendum)
Pt c/o sore throat, head congestion, headache, productive cough (brown phlegm). ?Started: last Wednesday  ?

## 2021-04-13 NOTE — Discharge Instructions (Addendum)
Your chest x-ray today is very reassuring that you have not developed a serious case of pneumonia.  I do believe that you still would benefit from a short course of antibiotics and some cough medicine.  I also recommend that we start you on a nasal decongestant to help dry up mucous membranes and reduce postnasal drip. ? ?Please see the list below for recommended medications, dosages and frequencies to provide relief of your current symptoms:   ?  ?Cefdinir (Omnicef):  1 capsule twice daily for 5 days, you can take it with or without food.  This antibiotic can cause upset stomach, this will resolve once antibiotics are complete.  You are welcome to use a probiotic, eat yogurt, take Imodium while taking this medication.  Please avoid other systemic medications such as Maalox, Pepto-Bismol or milk of magnesia as they can interfere with your body's ability to absorb the antibiotics. ?  ?Ipratropium (Atrovent): This is an excellent nasal decongestant spray that does not cause rebound congestion, can be used up to 4 times daily as needed, instill 2 sprays into each nare with each use.  I have provided you with a prescription for this medication.    ?  ?Guaifenesin (Robitussin, Mucinex): This is an expectorant.  This helps break up chest congestion and loosen up thick nasal drainage making phlegm and drainage more liquid and therefore easier to remove.  I recommend being 400 mg three times daily as needed.    ?  ?Please follow-up within the next 3 to 5 days either with your primary care provider or urgent care if your symptoms do not resolve.  If you do not have a primary care provider, we will assist you in finding one. ?  ?Thank you for visiting urgent care today.  We appreciate the opportunity to participate in your care. ? ?

## 2021-04-14 ENCOUNTER — Telehealth: Payer: Self-pay | Admitting: Internal Medicine

## 2021-04-14 NOTE — Telephone Encounter (Signed)
Spoke with patient today. ? ?She will take Immodium for now and see if that helps slow down the diarrhea.  ?

## 2021-04-14 NOTE — Telephone Encounter (Signed)
Pt was seen in ed on 3-20 due to pneumonia ? ?Pt states she was prescribed 3 different medications and she "thinks the cefdinir is making my colon leak" ? ?Pt is requesting a cb to inquire if a different antibiotic can be prescribed  ?

## 2021-04-16 ENCOUNTER — Other Ambulatory Visit: Payer: Self-pay

## 2021-04-16 MED ORDER — AMLODIPINE BESYLATE 5 MG PO TABS: 5.0000 mg | ORAL_TABLET | Freq: Every day | ORAL | 1 refills | Status: DC

## 2021-04-16 MED ORDER — LISINOPRIL 40 MG PO TABS: ORAL_TABLET | ORAL | 1 refills | Status: DC

## 2021-04-21 ENCOUNTER — Other Ambulatory Visit: Payer: Self-pay

## 2021-04-21 ENCOUNTER — Ambulatory Visit
Admission: RE | Admit: 2021-04-21 | Discharge: 2021-04-21 | Disposition: A | Discharge status: Home / Self Care | Payer: Medicare Other | Source: Ambulatory Visit | Attending: Internal Medicine | Admitting: Internal Medicine

## 2021-04-21 DIAGNOSIS — Z1231 Encounter for screening mammogram for malignant neoplasm of breast: Secondary | ICD-10-CM | POA: Diagnosis not present

## 2021-04-29 ENCOUNTER — Other Ambulatory Visit: Payer: Self-pay

## 2021-04-29 ENCOUNTER — Telehealth: Payer: Self-pay | Admitting: Family Medicine

## 2021-04-29 DIAGNOSIS — M5416 Radiculopathy, lumbar region: Secondary | ICD-10-CM

## 2021-04-29 NOTE — Telephone Encounter (Signed)
Patient called asking if another epidural could be ordered for her? ? ?Please advise. ? ?

## 2021-04-29 NOTE — Telephone Encounter (Signed)
Epidural order. Patient notified.  ?

## 2021-05-12 ENCOUNTER — Other Ambulatory Visit: Payer: Medicare Other

## 2021-05-18 ENCOUNTER — Ambulatory Visit
Admission: RE | Admit: 2021-05-18 | Discharge: 2021-05-18 | Disposition: A | Discharge status: Home / Self Care | Payer: Medicare Other | Source: Ambulatory Visit | Attending: Family Medicine | Admitting: Family Medicine

## 2021-05-18 DIAGNOSIS — M5416 Radiculopathy, lumbar region: Secondary | ICD-10-CM

## 2021-05-18 MED ORDER — IOPAMIDOL (ISOVUE-M 200) INJECTION 41%
1.0000 mL | Freq: Once | INTRAMUSCULAR | Status: AC
  Administered 2021-05-18: 1 mL via EPIDURAL

## 2021-05-18 MED ORDER — METHYLPREDNISOLONE ACETATE 40 MG/ML INJ SUSP (RADIOLOG
80.0000 mg | Freq: Once | INTRAMUSCULAR | Status: AC
  Administered 2021-05-18: 80 mg via EPIDURAL

## 2021-05-18 NOTE — Discharge Instructions (Signed)

## 2021-05-29 ENCOUNTER — Encounter: Payer: Medicare Other | Admitting: Sports Medicine

## 2021-05-29 NOTE — Progress Notes (Signed)
Erroneous encounter

## 2021-06-05 NOTE — Progress Notes (Signed)
?Charlann Ayala D.O. ?[facility] ?[place] ?Phone: [phone] ?Subjective:   ?I, Kristina Ayala, am serving as a scribe for Dr. Hulan Saas. ? ?This visit occurred during the SARS-CoV-2 public health emergency.  Safety protocols were in place, including screening questions prior to the visit, additional usage of staff PPE, and extensive cleaning of exam room while observing appropriate contact time as indicated for disinfecting solutions.  ? ?I'm seeing this patient by the request  of:  [name], MD ? ?CC: Knee pain, back pain follow-up ? ?[id number]  ?03/11/2021 ?Significant improvement with the knee injection as well.  No other significant changes.  Could possibly be a candidate for viscosupplementation but I see patient doing well with conservative therapy. ? ?Improvement noted at this point.  We will continue with the conservative therapy.  Discussed icing regimen and home exercises.  Increase activity as tolerated.  Follow-up again in 3 months can always repeat epidurals if needed. ? ? ?Update 06/09/2021 ?Kristina Ayala is a 76 y.o. female coming in with complaint of L knee and L ankle pain. Epidural on 05/18/2021. Patient said that she had a day with increased pain in her body and she took zanaflex which helped on May 3rd. Today she is feeling good.  ? ? ?  ? ?Past Medical History:  ?Diagnosis Date  ? Arthritis   ? Bursitis   ? left elbow  ? Clostridium difficile infection   ? Diabetes mellitus without complication ([place])   ? Frequent falls   ? HLD (hyperlipidemia)   ? Hypertension   ? Infectious colitis   ? ?Past Surgical History:  ?Procedure Laterality Date  ? CATARACT EXTRACTION Bilateral   ? COLONOSCOPY    ? ORIF PROXIMAL TIBIAL PLATEAU FRACTURE Left 02/19/2012  ? knee  ? POLYPECTOMY    ? TONSILLECTOMY    ? ?Social History  ? ?Socioeconomic History  ? Marital status: Divorced  ?  Spouse name: Not on file  ? Number of children: 2  ? Years of education:  Not on file  ? Highest education level: Associate degree: academic program  ?Occupational History  ? Occupation: RETIRED  ?Tobacco Use  ? Smoking status: Former  ?  Packs/day: 0.25  ?  Years: 50.00  ?  Pack years: 12.50  ?  Types: Cigarettes  ?  Quit date: 08/28/2014  ?  Years since quitting: 6.7  ? Smokeless tobacco: Never  ? Tobacco comments:  ?  1 pack every 3 days  ?Vaping Use  ? Vaping Use: Never used  ?Substance and Sexual Activity  ? Alcohol use: No  ?  Alcohol/week: 0.0 standard drinks  ? Drug use: No  ? Sexual activity: Not Currently  ?Other Topics Concern  ? Not on file  ?Social History Narrative  ? Lives alone   ? Right handed  ? Caffeine: 3x weekly  ?   ?   ? ?Social Determinants of Health  ? ?Financial Resource Strain: Low Risk   ? Difficulty of Paying Living Expenses: Not hard at all  ?Food Insecurity: No Food Insecurity  ? Worried About Charity fundraiser in the Last Year: Never true  ? Ran Out of Food in the Last Year: Never true  ?Transportation Needs: No Transportation Needs  ? Lack of Transportation (Medical): No  ? Lack of Transportation (Non-Medical): No  ?Physical Activity: Inactive  ? Days of Exercise per Week: 0 days  ? Minutes of Exercise per Session: 0  min  ?Stress: No Stress Concern Present  ? Feeling of Stress : Not at all  ?Social Connections: Moderately Integrated  ? Frequency of Communication with Friends and Family: More than three times a week  ? Frequency of Social Gatherings with Friends and Family: More than three times a week  ? Attends Religious Services: More than 4 times per year  ? Active Member of Clubs or Organizations: Yes  ? Attends Archivist Meetings: More than 4 times per year  ? Marital Status: Widowed  ? ?Allergies  ?Allergen Reactions  ? Doxycycline Diarrhea  ? Minocycline Diarrhea  ? Wellbutrin [Bupropion] Swelling  ?  Per pt her tongue was swollen and sore/symptoms stopped once the medication was discontinued.   ? Penicillins Rash  ?  Has patient had a  PCN reaction causing immediate rash, facial/tongue/throat swelling, SOB or lightheadedness with hypotension: no ?Has patient had a PCN reaction causing severe rash involving mucus membranes or skin necrosis: no ?Has patient had a PCN reaction that required hospitalization: unknown ?Has patient had a PCN reaction occurring within the last 10 years: no ?If all of the above answers are "NO", then may proceed with Cephalosporin use. ?  ? Z-Pak [Azithromycin] Other (See Comments)  ?  diarrhea  ? ?Family History  ?Problem Relation Age of Onset  ? Diverticulosis Mother   ? Ovarian cancer Mother   ? Pancreatic cancer Father   ? Lung cancer Father   ? Colon polyps Sister   ? Colon cancer Neg Hx   ? Breast cancer Neg Hx   ? ? ?Current Outpatient Medications (Endocrine & Metabolic):  ?  denosumab (PROLIA) 60 MG/ML SOSY injection, INJECT '60MG'$  INTO THE SKIN AS DIRECTED ? ?Current Outpatient Medications (Cardiovascular):  ?  amLODipine (NORVASC) 5 MG tablet, Take 1 tablet (5 mg total) by mouth daily. TAKE 1 AND 1/2 TABLETS BY MOUTH DAILY ?  atorvastatin (LIPITOR) 40 MG tablet, TAKE 1 TABLET(40 MG) BY MOUTH DAILY. FOLLOW-UP APPOINTMENT WITH LABS ARE DUE ?  hydrochlorothiazide (HYDRODIURIL) 25 MG tablet, TAKE 1 TABLET(25 MG) BY MOUTH EVERY MORNING ?  lisinopril (ZESTRIL) 40 MG tablet, TAKE 1 TABLET(40 MG) BY MOUTH DAILY ? ?Current Outpatient Medications (Respiratory):  ?  Camphor-Eucalyptus-Menthol (VICKS VAPORUB EX), Apply 1 application topically as needed (congestion). ?  fluticasone (FLONASE) 50 MCG/ACT nasal spray, Place 2 sprays into both nostrils daily. ?  guaifenesin (HUMIBID E) 400 MG TABS tablet, Take 1 tablet 3 times daily as needed for chest congestion and cough ?  ipratropium (ATROVENT) 0.06 % nasal spray, Place 2 sprays into both nostrils 4 (four) times daily. As needed for nasal congestion, runny nose ? ? ?Current Outpatient Medications (Hematological):  ?  Cyanocobalamin (VITAMIN B-12) 2500 MCG SUBL, Place 2,500 mcg  under the tongue daily. ? ?Current Outpatient Medications (Other):  ?  ALPRAZolam (XANAX) 0.5 MG tablet, TAKE 1 TABLET(0.5 MG) BY MOUTH AT BEDTIME AS NEEDED FOR ANXIETY ?  cholecalciferol (VITAMIN D) 25 MCG (1000 UNIT) tablet, Take 2,000 Units by mouth daily. ?  gabapentin (NEURONTIN) 100 MG capsule, Take 2 capsules (200 mg total) by mouth at bedtime. (Patient taking differently: Take 200 mg by mouth at bedtime as needed (neuropathy).) ?  potassium chloride (KLOR-CON) 8 MEQ tablet, TAKE 2 TABLETS(16 MEQ) BY MOUTH DAILY (Patient taking differently: Take 16 mEq by mouth daily.) ?  TURMERIC PO, Take 1 tablet by mouth daily. ? ? ? ?Review of Systems: ? No headache, visual changes, nausea, vomiting, diarrhea, constipation, dizziness, abdominal  pain, skin rash, fevers, chills, night sweats, weight loss, swollen lymph nodes, body aches, joint swelling, chest pain, shortness of breath, mood changes. POSITIVE muscle aches but very minimal ? ?Objective  ?Blood pressure 122/84, pulse (!) 118, height '5\' 5"'$  (1.651 m), weight 168 lb (76.2 kg), SpO2 97 %. ?  ?General: No apparent distress alert and oriented x3 mood and affect normal, dressed appropriately.  ?HEENT: Pupils equal, extraocular movements intact  ?Respiratory: Patient's speak in full sentences and does not appear short of breath  ?Cardiovascular: No lower extremity edema, non tender, no erythema  ?Gait normal with good balance and coordination.  ?MSK:  Non tender with full range of motion and good stability and symmetric strength and tone of sho arthritic changes of the knees but seems to be doing relatively well.  Patient does have very mild loss of lordosis.  Patient does have mild tenderness over the knees but no significant effusion ? ?  ?Impression and Recommendations:  ?  ?The above documentation has been reviewed and is accurate and complete Lyndal Pulley, DO ? ? ? ?

## 2021-06-09 ENCOUNTER — Ambulatory Visit (INDEPENDENT_AMBULATORY_CARE_PROVIDER_SITE_OTHER): Payer: Medicare Other | Admitting: Family Medicine

## 2021-06-09 ENCOUNTER — Encounter: Payer: Self-pay | Admitting: Family Medicine

## 2021-06-09 DIAGNOSIS — M1732 Unilateral post-traumatic osteoarthritis, left knee: Secondary | ICD-10-CM | POA: Diagnosis not present

## 2021-06-09 DIAGNOSIS — M5416 Radiculopathy, lumbar region: Secondary | ICD-10-CM

## 2021-06-09 NOTE — Assessment & Plan Note (Signed)
Doing much better with conservative therapy at this moment.  Discussed icing regimen and home exercises, which activities to do which ones to avoid.  No significant changes.  If needed we can do another epidural but at the moment doing well. ?

## 2021-06-09 NOTE — Assessment & Plan Note (Signed)
Patient is now 4 months out since her last injection and is doing very well.  Discussed with patient about icing regimen and home exercises, discussed which activities to do and which ones to avoid, increase activity slowly.  Follow-up with me again in 3 to 4 months if necessary or as needed. ?

## 2021-06-09 NOTE — Patient Instructions (Signed)
See if you can get Philip Aspen on the phone ?See me in 3 months ?

## 2021-06-17 ENCOUNTER — Encounter: Payer: Self-pay | Admitting: Family Medicine

## 2021-06-17 ENCOUNTER — Ambulatory Visit: Payer: Self-pay

## 2021-06-17 ENCOUNTER — Ambulatory Visit (INDEPENDENT_AMBULATORY_CARE_PROVIDER_SITE_OTHER): Payer: Medicare Other | Admitting: Family Medicine

## 2021-06-17 VITALS — BP 114/76 | HR 101 | Ht 65.0 in | Wt 168.0 lb

## 2021-06-17 DIAGNOSIS — M1732 Unilateral post-traumatic osteoarthritis, left knee: Secondary | ICD-10-CM | POA: Diagnosis not present

## 2021-06-17 DIAGNOSIS — G8929 Other chronic pain: Secondary | ICD-10-CM

## 2021-06-17 DIAGNOSIS — M25562 Pain in left knee: Secondary | ICD-10-CM

## 2021-06-17 NOTE — Assessment & Plan Note (Signed)
Injection given tolerated the procedure well, discussed icing regimen and home exercises, which activities to do and which ones to avoid, increase activity slowly.  Follow-up again in 6 to 8 weeks.

## 2021-06-17 NOTE — Progress Notes (Signed)
[place] Phone: [phone] Subjective:   [name], am serving as a scribe for Dr. Hulan Saas.   I'm seeing this patient by the request  of:  [name], MD  CC: left knee pain   [id number]  Kristina Ayala is a 76 y.o. female coming in with complaint of L knee pain since Sunday. Patient states that she was dancing with her granddaughter and she was doing a move when she kissed medial aspect of knees together with feet planted. Pain in L knee exacerbated.     Past Medical History:  Diagnosis Date   Arthritis    Bursitis    left elbow   Clostridium difficile infection    Diabetes mellitus without complication (HCC)    Frequent falls    HLD (hyperlipidemia)    Hypertension    Infectious colitis    Past Surgical History:  Procedure Laterality Date   CATARACT EXTRACTION Bilateral    COLONOSCOPY     ORIF PROXIMAL TIBIAL PLATEAU FRACTURE Left 02/19/2012   knee   POLYPECTOMY     TONSILLECTOMY     Social History   Socioeconomic History   Marital status: Divorced    Spouse name: Not on file   Number of children: 2   Years of education: Not on file   Highest education level: Associate degree: academic program  Occupational History   Occupation: RETIRED  Tobacco Use   Smoking status: Former    Packs/day: 0.25    Years: 50.00    Pack years: 12.50    Types: Cigarettes    Quit date: 08/28/2014    Years since quitting: 6.8   Smokeless tobacco: Never   Tobacco comments:    1 pack every 3 days  Vaping Use   Vaping Use: Never used  Substance and Sexual Activity   Alcohol use: No    Alcohol/week: 0.0 standard drinks   Drug use: No   Sexual activity: Not Currently  Other Topics Concern   Not on file  Social History Narrative   Lives alone    Right handed   Caffeine: 3x weekly         Social Determinants of Health   Financial Resource Strain: Low Risk    Difficulty of  Paying Living Expenses: Not hard at all  Food Insecurity: No Food Insecurity   Worried About Charity fundraiser in the Last Year: Never true   Ran Out of Food in the Last Year: Never true  Transportation Needs: No Transportation Needs   Lack of Transportation (Medical): No   Lack of Transportation (Non-Medical): No  Physical Activity: Inactive   Days of Exercise per Week: 0 days   Minutes of Exercise per Session: 0 min  Stress: No Stress Concern Present   Feeling of Stress : Not at all  Social Connections: Moderately Integrated   Frequency of Communication with Friends and Family: More than three times a week   Frequency of Social Gatherings with Friends and Family: More than three times a week   Attends Religious Services: More than 4 times per year   Active Member of [employer] or Organizations: Yes   Attends Archivist Meetings: More than 4 times per year   Marital Status: Widowed   Allergies  Allergen Reactions   Doxycycline Diarrhea   Minocycline Diarrhea   Wellbutrin [Bupropion] Swelling    Per pt her tongue was swollen and sore/symptoms  stopped once the medication was discontinued.    Penicillins Rash    Has patient had a PCN reaction causing immediate rash, facial/tongue/throat swelling, SOB or lightheadedness with hypotension: no Has patient had a PCN reaction causing severe rash involving mucus membranes or skin necrosis: no Has patient had a PCN reaction that required hospitalization: unknown Has patient had a PCN reaction occurring within the last 10 years: no If all of the above answers are "NO", then may proceed with Cephalosporin use.    Z-Pak [Azithromycin] Other (See Comments)    diarrhea   Family History  Problem Relation Age of Onset   Diverticulosis Mother    Ovarian cancer Mother    Pancreatic cancer Father    Lung cancer Father    Colon polyps Sister    Colon cancer Neg Hx    Breast cancer Neg Hx     Current Outpatient Medications (Endocrine  & Metabolic):    denosumab (PROLIA) 60 MG/ML SOSY injection, INJECT '60MG'$  INTO THE SKIN AS DIRECTED  Current Outpatient Medications (Cardiovascular):    amLODipine (NORVASC) 5 MG tablet, Take 1 tablet (5 mg total) by mouth daily. TAKE 1 AND 1/2 TABLETS BY MOUTH DAILY   atorvastatin (LIPITOR) 40 MG tablet, TAKE 1 TABLET(40 MG) BY MOUTH DAILY. FOLLOW-UP APPOINTMENT WITH LABS ARE DUE   hydrochlorothiazide (HYDRODIURIL) 25 MG tablet, TAKE 1 TABLET(25 MG) BY MOUTH EVERY MORNING   lisinopril (ZESTRIL) 40 MG tablet, TAKE 1 TABLET(40 MG) BY MOUTH DAILY  Current Outpatient Medications (Respiratory):    Camphor-Eucalyptus-Menthol (VICKS VAPORUB EX), Apply 1 application topically as needed (congestion).   fluticasone (FLONASE) 50 MCG/ACT nasal spray, Place 2 sprays into both nostrils daily.   guaifenesin (HUMIBID E) 400 MG TABS tablet, Take 1 tablet 3 times daily as needed for chest congestion and cough   ipratropium (ATROVENT) 0.06 % nasal spray, Place 2 sprays into both nostrils 4 (four) times daily. As needed for nasal congestion, runny nose   Current Outpatient Medications (Hematological):    Cyanocobalamin (VITAMIN B-12) 2500 MCG SUBL, Place 2,500 mcg under the tongue daily.  Current Outpatient Medications (Other):    ALPRAZolam (XANAX) 0.5 MG tablet, TAKE 1 TABLET(0.5 MG) BY MOUTH AT BEDTIME AS NEEDED FOR ANXIETY   cholecalciferol (VITAMIN D) 25 MCG (1000 UNIT) tablet, Take 2,000 Units by mouth daily.   gabapentin (NEURONTIN) 100 MG capsule, Take 2 capsules (200 mg total) by mouth at bedtime. (Patient taking differently: Take 200 mg by mouth at bedtime as needed (neuropathy).)   potassium chloride (KLOR-CON) 8 MEQ tablet, TAKE 2 TABLETS(16 MEQ) BY MOUTH DAILY (Patient taking differently: Take 16 mEq by mouth daily.)   TURMERIC PO, Take 1 tablet by mouth daily.   Reviewed prior external information including notes and imaging from  primary care provider As well as notes that were available from  care everywhere and other healthcare systems.  Past medical history, social, surgical and family history all reviewed in electronic medical record.  No pertanent information unless stated regarding to the chief complaint.   Review of Systems:  No headache, visual changes, nausea, vomiting, diarrhea, constipation, dizziness, abdominal pain, skin rash, fevers, chills, night sweats, weight loss, swollen lymph nodes, body aches, joint swelling, chest pain, shortness of breath, mood changes. POSITIVE muscle aches  Objective  Blood pressure 114/76, pulse (!) 101, height '5\' 5"'$  (1.651 m), weight 168 lb (76.2 kg), SpO2 98 %.   General: No apparent distress alert and oriented x3 mood and affect normal, dressed appropriately.  HEENT:  Pupils equal, extraocular movements intact  Respiratory: Patient's speak in full sentences and does not appear short of breath  Cardiovascular: No lower extremity edema, non tender, no erythema  Gait antalgic gait noted  MSK: Patient's left knee does have a mild effusion noted.  Tender to palpation over the patellofemoral joint noted.   Limited muscular skeletal ultrasound was performed and interpreted by Hulan Saas, M  Limited ultrasound of patient's left knee shows that there is an acute effusion noted.  Possible small calcific loose bodies noted.  Patient still has the arthritic changes noted.  No new abnormality noted of the meniscus. Impression: New effusion and continued arthritis  After informed written and verbal consent, patient was seated on exam table. Left knee was prepped with alcohol swab and utilizing anterolateral approach, patient's left knee space was injected with 4:1  marcaine 0.5%: Kenalog '40mg'$ /dL. Patient tolerated the procedure well without immediate complications.   Impression and Recommendations:     The above documentation has been reviewed and is accurate and complete Lyndal Pulley, DO

## 2021-06-17 NOTE — Patient Instructions (Signed)
Keep another appt Always next year for [employer]

## 2021-06-17 NOTE — Progress Notes (Signed)
[place] Phone: [phone] Subjective:    I'm seeing this patient by the request  of:  [name], MD  CC:   [id number]  06/09/2021 Patient is now 4 months out since her last injection and is doing very well.  Discussed with patient about icing regimen and home exercises, discussed which activities to do and which ones to avoid, increase activity slowly.  Follow-up with me again in 3 to 4 months if necessary or as needed.  Doing much better with conservative therapy at this moment.  Discussed icing regimen and home exercises, which activities to do which ones to avoid.  No significant changes.  If needed we can do another epidural but at the moment doing well.  Updated 06/17/2021 Kristina Ayala is a 76 y.o. female coming in with complaint of knee pain       Past Medical History:  Diagnosis Date   Arthritis    Bursitis    left elbow   Clostridium difficile infection    Diabetes mellitus without complication (H[facility])    Frequent falls    HLD (hyperlipidemia)    Hypertension    Infectious colitis    Past Surgical History:  Procedure Laterality Date   CATARACT EXTRACTION Bilateral    COLONOSCOPY     ORIF PROXIMAL TIBIAL PLATEAU FRACTURE Left 02/19/2012   knee   POLYPECTOMY     TONSILLECTOMY     Social History   Socioeconomic History   Marital status: Divorced    Spouse name: Not on file   Number of children: 2   Years of education: Not on file   Highest education level: Associate degree: academic program  Occupational History   Occupation: RETIRED  Tobacco Use   Smoking status: Former    Packs/day: 0.25    Years: 50.00    Pack years: 12.50    Types: Cigarettes    Quit date: 08/28/2014    Years since quitting: 6.8   Smokeless tobacco: Never   Tobacco comments:    1 pack every 3 days  Vaping Use   Vaping Use: Never used  Substance and Sexual Activity   Alcohol use: No     Alcohol/week: 0.0 standard drinks   Drug use: No   Sexual activity: Not Currently  Other Topics Concern   Not on file  Social History Narrative   Lives alone    Right handed   Caffeine: 3x weekly         Social Determinants of Health   Financial Resource Strain: Low Risk    Difficulty of Paying Living Expenses: Not hard at all  Food Insecurity: No Food Insecurity   Worried About Charity fundraiser in the Last Year: Never true   Ran Out of Food in the Last Year: Never true  Transportation Needs: No Transportation Needs   Lack of Transportation (Medical): No   Lack of Transportation (Non-Medical): No  Physical Activity: Inactive   Days of Exercise per Week: 0 days   Minutes of Exercise per Session: 0 min  Stress: No Stress Concern Present   Feeling of Stress : Not at all  Social Connections: Moderately Integrated   Frequency of Communication with Friends and Family: More than three times a week   Frequency of Social Gatherings with Friends and Family: More than three times a week   Attends Religious Services: More than 4 times per year   Active Member of  Clubs or Organizations: Yes   Attends Archivist Meetings: More than 4 times per year   Marital Status: Widowed   Allergies  Allergen Reactions   Doxycycline Diarrhea   Minocycline Diarrhea   Wellbutrin [Bupropion] Swelling    Per pt her tongue was swollen and sore/symptoms stopped once the medication was discontinued.    Penicillins Rash    Has patient had a PCN reaction causing immediate rash, facial/tongue/throat swelling, SOB or lightheadedness with hypotension: no Has patient had a PCN reaction causing severe rash involving mucus membranes or skin necrosis: no Has patient had a PCN reaction that required hospitalization: unknown Has patient had a PCN reaction occurring within the last 10 years: no If all of the above answers are "NO", then may proceed with Cephalosporin use.    Z-Pak [Azithromycin] Other  (See Comments)    diarrhea   Family History  Problem Relation Age of Onset   Diverticulosis Mother    Ovarian cancer Mother    Pancreatic cancer Father    Lung cancer Father    Colon polyps Sister    Colon cancer Neg Hx    Breast cancer Neg Hx     Current Outpatient Medications (Endocrine & Metabolic):    denosumab (PROLIA) 60 MG/ML SOSY injection, INJECT '60MG'$  INTO THE SKIN AS DIRECTED  Current Outpatient Medications (Cardiovascular):    amLODipine (NORVASC) 5 MG tablet, Take 1 tablet (5 mg total) by mouth daily. TAKE 1 AND 1/2 TABLETS BY MOUTH DAILY   atorvastatin (LIPITOR) 40 MG tablet, TAKE 1 TABLET(40 MG) BY MOUTH DAILY. FOLLOW-UP APPOINTMENT WITH LABS ARE DUE   hydrochlorothiazide (HYDRODIURIL) 25 MG tablet, TAKE 1 TABLET(25 MG) BY MOUTH EVERY MORNING   lisinopril (ZESTRIL) 40 MG tablet, TAKE 1 TABLET(40 MG) BY MOUTH DAILY  Current Outpatient Medications (Respiratory):    Camphor-Eucalyptus-Menthol (VICKS VAPORUB EX), Apply 1 application topically as needed (congestion).   fluticasone (FLONASE) 50 MCG/ACT nasal spray, Place 2 sprays into both nostrils daily.   guaifenesin (HUMIBID E) 400 MG TABS tablet, Take 1 tablet 3 times daily as needed for chest congestion and cough   ipratropium (ATROVENT) 0.06 % nasal spray, Place 2 sprays into both nostrils 4 (four) times daily. As needed for nasal congestion, runny nose   Current Outpatient Medications (Hematological):    Cyanocobalamin (VITAMIN B-12) 2500 MCG SUBL, Place 2,500 mcg under the tongue daily.  Current Outpatient Medications (Other):    ALPRAZolam (XANAX) 0.5 MG tablet, TAKE 1 TABLET(0.5 MG) BY MOUTH AT BEDTIME AS NEEDED FOR ANXIETY   cholecalciferol (VITAMIN D) 25 MCG (1000 UNIT) tablet, Take 2,000 Units by mouth daily.   gabapentin (NEURONTIN) 100 MG capsule, Take 2 capsules (200 mg total) by mouth at bedtime. (Patient taking differently: Take 200 mg by mouth at bedtime as needed (neuropathy).)   potassium chloride  (KLOR-CON) 8 MEQ tablet, TAKE 2 TABLETS(16 MEQ) BY MOUTH DAILY (Patient taking differently: Take 16 mEq by mouth daily.)   TURMERIC PO, Take 1 tablet by mouth daily.   Reviewed prior external information including notes and imaging from  primary care provider As well as notes that were available from care everywhere and other healthcare systems.  Past medical history, social, surgical and family history all reviewed in electronic medical record.  No pertanent information unless stated regarding to the chief complaint.   Review of Systems:  No headache, visual changes, nausea, vomiting, diarrhea, constipation, dizziness, abdominal pain, skin rash, fevers, chills, night sweats, weight loss, swollen lymph nodes, body aches,  joint swelling, chest pain, shortness of breath, mood changes. POSITIVE muscle aches  Objective  There were no vitals taken for this visit.   General: No apparent distress alert and oriented x3 mood and affect normal, dressed appropriately.  HEENT: Pupils equal, extraocular movements intact  Respiratory: Patient's speak in full sentences and does not appear short of breath  Cardiovascular: No lower extremity edema, non tender, no erythema  Gait normal with good balance and coordination.  MSK:  Non tender with full range of motion and good stability and symmetric strength and tone of shoulders, elbows, wrist, hip, knee and ankles bilaterally.     Impression and Recommendations:     The above documentation has been reviewed and is accurate and complete Delsa Sale

## 2021-06-25 ENCOUNTER — Other Ambulatory Visit: Payer: Self-pay | Admitting: Internal Medicine

## 2021-07-01 NOTE — Progress Notes (Deleted)
   I, Wendy Poet, LAT, ATC, am serving as scribe for Dr. [name].  Kristina Ayala is a 76 y.o. female who presents to [place] at [facility] today for L arm and L hip pain.  She was last seen by Dr. Tamala Julian on 06/17/21 w/ c/o L knee pain that occurred while dancing w/ her granddaughter.  Prior to that, she was seen for lumbar radiculopathy.  Today, pt reports having fallen on 06/30/21 and ...  Diagnostic testing: L-spine MRI- 02/21/21  Pertinent review of systems: ***  Relevant historical information: ***   Exam:  There were no vitals taken for this visit. General: Well Developed, well nourished, and in no acute distress.   MSK: ***    Lab and Radiology Results No results found for this or any previous visit (from the past 72 hour(s)). No results found.     Assessment and Plan: 76 y.o. female with ***   PDMP not reviewed this encounter. No orders of the defined types were placed in this encounter.  No orders of the defined types were placed in this encounter.    Discussed warning signs or symptoms. Please see discharge instructions. Patient expresses understanding.   ***

## 2021-07-02 ENCOUNTER — Ambulatory Visit: Payer: Medicare Other | Admitting: Family Medicine

## 2021-07-16 ENCOUNTER — Other Ambulatory Visit: Payer: Self-pay | Admitting: Internal Medicine

## 2021-08-06 ENCOUNTER — Ambulatory Visit: Payer: Medicare Other | Admitting: Family Medicine

## 2021-08-24 ENCOUNTER — Ambulatory Visit: Payer: Medicare Other | Admitting: Internal Medicine

## 2021-08-24 DIAGNOSIS — Z0001 Encounter for general adult medical examination with abnormal findings: Secondary | ICD-10-CM | POA: Diagnosis not present

## 2021-08-24 DIAGNOSIS — Z0000 Encounter for general adult medical examination without abnormal findings: Secondary | ICD-10-CM | POA: Diagnosis not present

## 2021-08-24 DIAGNOSIS — E876 Hypokalemia: Secondary | ICD-10-CM | POA: Diagnosis not present

## 2021-08-24 DIAGNOSIS — Z79899 Other long term (current) drug therapy: Secondary | ICD-10-CM | POA: Diagnosis not present

## 2021-08-24 DIAGNOSIS — R059 Cough, unspecified: Secondary | ICD-10-CM | POA: Diagnosis not present

## 2021-08-25 NOTE — Progress Notes (Unsigned)
[place] Phone: [phone] Subjective:   [name], am serving as a scribe for Dr. Hulan Saas.  I'm seeing this patient by the request  of:  [name], MD  CC: Back pain and pain all over  DHR:[id number]  06/17/2021 Injection given tolerated the procedure well, discussed icing regimen and home exercises, which activities to do and which ones to avoid, increase activity slowly.  Follow-up again in 6 to 8 weeks.  Update 08/26/2021 Kristina Ayala is a 76 y.o. female coming in with complaint of L knee and LBP. Epidural May 18, 2021. Patient states that she feels like she needs to get another epidural. Has to use a cane at times due to back pain.   Also c/o L hand and R shoulder pain. No injury to these areas.  Patient states she is having more difficulty with arthritis.  Denies fevers chills or any abnormal weight loss.      Past Medical History:  Diagnosis Date   Arthritis    Bursitis    left elbow   Clostridium difficile infection    Diabetes mellitus without complication (HCC)    Frequent falls    HLD (hyperlipidemia)    Hypertension    Infectious colitis    Past Surgical History:  Procedure Laterality Date   CATARACT EXTRACTION Bilateral    COLONOSCOPY     ORIF PROXIMAL TIBIAL PLATEAU FRACTURE Left 02/19/2012   knee   POLYPECTOMY     TONSILLECTOMY     Social History   Socioeconomic History   Marital status: Divorced    Spouse name: Not on file   Number of children: 2   Years of education: Not on file   Highest education level: Associate degree: academic program  Occupational History   Occupation: RETIRED  Tobacco Use   Smoking status: Former    Packs/day: 0.25    Years: 50.00    Total pack years: 12.50    Types: Cigarettes    Quit date: 08/28/2014    Years since quitting: 7.0   Smokeless tobacco: Never   Tobacco comments:    1 pack every 3 days  Vaping Use    Vaping Use: Never used  Substance and Sexual Activity   Alcohol use: No    Alcohol/week: 0.0 standard drinks of alcohol   Drug use: No   Sexual activity: Not Currently  Other Topics Concern   Not on file  Social History Narrative   Lives alone    Right handed   Caffeine: 3x weekly         Social Determinants of Health   Financial Resource Strain: Low Risk  (02/20/2021)   Overall Financial Resource Strain (CARDIA)    Difficulty of Paying Living Expenses: Not hard at all  Food Insecurity: No Food Insecurity (02/20/2021)   Hunger Vital Sign    Worried About Running Out of Food in the Last Year: Never true    [place] in the Last Year: Never true  Transportation Needs: No Transportation Needs (02/20/2021)   PRAPARE - Hydrologist (Medical): No    Lack of Transportation (Non-Medical): No  Physical Activity: Inactive (02/20/2021)   Exercise Vital Sign    Days of Exercise per Week: 0 days    Minutes of Exercise per Session: 0 min  Stress: No Stress Concern Present (02/20/2021)   [employer]  Stress Questionnaire    Feeling of Stress : Not at all  Social Connections: Moderately Integrated (02/20/2021)   Social Connection and Isolation Panel [NHANES]    Frequency of Communication with Friends and Family: More than three times a week    Frequency of Social Gatherings with Friends and Family: More than three times a week    Attends Religious Services: More than 4 times per year    Active Member of [employer] or Organizations: Yes    Attends Archivist Meetings: More than 4 times per year    Marital Status: Widowed   Allergies  Allergen Reactions   Doxycycline Diarrhea   Minocycline Diarrhea   Wellbutrin [Bupropion] Swelling    Per pt her tongue was swollen and sore/symptoms stopped once the medication was discontinued.    Penicillins Rash    Has patient had a PCN reaction causing immediate rash,  facial/tongue/throat swelling, SOB or lightheadedness with hypotension: no Has patient had a PCN reaction causing severe rash involving mucus membranes or skin necrosis: no Has patient had a PCN reaction that required hospitalization: unknown Has patient had a PCN reaction occurring within the last 10 years: no If all of the above answers are "NO", then may proceed with Cephalosporin use.    Z-Pak [Azithromycin] Other (See Comments)    diarrhea   Family History  Problem Relation Age of Onset   Diverticulosis Mother    Ovarian cancer Mother    Pancreatic cancer Father    Lung cancer Father    Colon polyps Sister    Colon cancer Neg Hx    Breast cancer Neg Hx     Current Outpatient Medications (Endocrine & Metabolic):    denosumab (PROLIA) 60 MG/ML SOSY injection, INJECT '60MG'$  INTO THE SKIN AS DIRECTED  Current Outpatient Medications (Cardiovascular):    amLODipine (NORVASC) 5 MG tablet, Take 1 tablet (5 mg total) by mouth daily. TAKE 1 AND 1/2 TABLETS BY MOUTH DAILY   atorvastatin (LIPITOR) 40 MG tablet, TAKE 1 TABLET(40 MG) BY MOUTH DAILY. FOLLOW-UP APPOINTMENT WITH LABS ARE DUE   hydrochlorothiazide (HYDRODIURIL) 25 MG tablet, TAKE 1 TABLET(25 MG) BY MOUTH EVERY MORNING   lisinopril (ZESTRIL) 40 MG tablet, TAKE 1 TABLET BY MOUTH DAILY  Current Outpatient Medications (Respiratory):    Camphor-Eucalyptus-Menthol (VICKS VAPORUB EX), Apply 1 application topically as needed (congestion).   fluticasone (FLONASE) 50 MCG/ACT nasal spray, Place 2 sprays into both nostrils daily.   guaifenesin (HUMIBID E) 400 MG TABS tablet, Take 1 tablet 3 times daily as needed for chest congestion and cough   ipratropium (ATROVENT) 0.06 % nasal spray, Place 2 sprays into both nostrils 4 (four) times daily. As needed for nasal congestion, runny nose   Current Outpatient Medications (Hematological):    Cyanocobalamin (VITAMIN B-12) 2500 MCG SUBL, Place 2,500 mcg under the tongue daily.  Current  Outpatient Medications (Other):    ALPRAZolam (XANAX) 0.5 MG tablet, TAKE 1 TABLET(0.5 MG) BY MOUTH AT BEDTIME AS NEEDED FOR ANXIETY   cholecalciferol (VITAMIN D) 25 MCG (1000 UNIT) tablet, Take 2,000 Units by mouth daily.   DULoxetine (CYMBALTA) 20 MG capsule, Take 1 capsule (20 mg total) by mouth daily.   gabapentin (NEURONTIN) 100 MG capsule, Take 2 capsules (200 mg total) by mouth at bedtime. (Patient taking differently: Take 200 mg by mouth at bedtime as needed (neuropathy).)   potassium chloride (KLOR-CON) 8 MEQ tablet, TAKE 2 TABLETS(16 MEQ) BY MOUTH DAILY (Patient taking differently: Take 16 mEq by mouth daily.)  TURMERIC PO, Take 1 tablet by mouth daily.   Reviewed prior external information including notes and imaging from  primary care provider As well as notes that were available from care everywhere and other healthcare systems.  Past medical history, social, surgical and family history all reviewed in electronic medical record.  No pertanent information unless stated regarding to the chief complaint.   Review of Systems:  No headache, visual changes, nausea, vomiting, diarrhea, constipation, dizziness, abdominal pain, skin rash, fevers, chills, night sweats, weight loss, swollen lymph nodes,  chest pain, shortness of breath, mood changes. POSITIVE muscle aches, body aches, joint swelling and pain  Objective  Blood pressure 118/78, pulse 80, height '5\' 5"'$  (1.651 m), weight 166 lb (75.3 kg), SpO2 99 %.   General: No apparent distress alert and oriented x3 mood and affect normal, dressed appropriately.  HEENT: Pupils equal, extraocular movements intact  Respiratory: Patient's speak in full sentences and does not appear short of breath  Cardiovascular: No lower extremity edema, non tender, no erythema  Antalgic gait noted.  Patient still has the instability of the knees but no significant swelling at the moment.  Patient does have loss of lordosis of the lumbar spine.  Tightness  with straight leg test.  Has more difficulty going from a seated to standing position.    Impression and Recommendations:     The above documentation has been reviewed and is accurate and complete Lyndal Pulley, DO

## 2021-08-25 NOTE — Telephone Encounter (Signed)
Prolia VOB initiated via [email or web address]  Last Prolia inj 03/25/21 Next Prolia inj 09/26/21  Prior Auth: APPROVED PA# [id number] Valid: 03/24/21-03/24/22

## 2021-08-26 ENCOUNTER — Ambulatory Visit (INDEPENDENT_AMBULATORY_CARE_PROVIDER_SITE_OTHER): Payer: Medicare Other | Admitting: Family Medicine

## 2021-08-26 VITALS — BP 118/78 | HR 80 | Ht 65.0 in | Wt 166.0 lb

## 2021-08-26 DIAGNOSIS — M1732 Unilateral post-traumatic osteoarthritis, left knee: Secondary | ICD-10-CM | POA: Diagnosis not present

## 2021-08-26 DIAGNOSIS — M5416 Radiculopathy, lumbar region: Secondary | ICD-10-CM

## 2021-08-26 MED ORDER — KETOROLAC TROMETHAMINE 30 MG/ML IJ SOLN
30.0000 mg | Freq: Once | INTRAMUSCULAR | Status: AC
  Administered 2021-08-26: 30 mg via INTRAMUSCULAR

## 2021-08-26 MED ORDER — METHYLPREDNISOLONE ACETATE 40 MG/ML IJ SUSP
40.0000 mg | Freq: Once | INTRAMUSCULAR | Status: AC
  Administered 2021-08-26: 40 mg via INTRAMUSCULAR

## 2021-08-26 MED ORDER — DULOXETINE HCL 20 MG PO CPEP: 20.0000 mg | ORAL_CAPSULE | Freq: Every day | ORAL | 0 refills | Status: DC

## 2021-08-26 NOTE — Assessment & Plan Note (Signed)
Chronic problem with worsening pain.  We will give patient Toradol and Depo-Medrol today secondary to the severity of the discomfort.  The patient has responded to epidurals previously and would like to consider another one at this time.  Patient is going nearly 4 months since her last injection.  At this point to I do feel that we should consider other medications and will start Cymbalta 20 mg.  Warned of potential side effects.  I am optimistic though it should make a significant difference.  Follow-up with me again in 4 to 6 weeks and if worsening pain seek medical attention

## 2021-08-26 NOTE — Assessment & Plan Note (Signed)
Improved after last injection.  We will continue to monitor.  We need to consider the possibility of viscosupplementation but has not responded extremely well in the past.

## 2021-08-26 NOTE — Patient Instructions (Addendum)
Epidural [phone] Injections in backside today Cymbalta '20mg'$   See me again in 5-6 weeks for LBP, L knee pain

## 2021-08-26 NOTE — Addendum Note (Signed)
Addended by: Judy Pimple R on: 08/26/2021 10:08 AM   Modules accepted: Orders

## 2021-08-28 ENCOUNTER — Telehealth: Payer: Self-pay

## 2021-08-28 NOTE — Telephone Encounter (Signed)
Patient called to let us know that she had went to urgent care about her toe that she had stubbed and it is in fact broken. Patient went to [facility] to be seen to get a boot and/or meds to help with the pain, she was unsure if she should wait till 215 to day or wait till she can get in with Dr. Tamala Julian. I informed her that she should go to the 215 appointment that way she can get a boot and/or meds that way she can walk better and get some relief. Also informed to continue the icing and tylenol and ibuprofen cycle she was doing until she sees the doctor at [facility] to help with the pain. Patient stated understanding

## 2021-08-31 NOTE — Progress Notes (Deleted)
   I, Peterson Lombard, LAT, ATC acting as a scribe for [name], MD.  Kristina Ayala is a 76 y.o. female who presents to [place] at [facility] today for L foot pain. Pt was previously seen at [facility]. Pt was last seen by Dr. Tamala Julian on 08/26/21 for LBP and was given a Toradol and Depo-Medrol IM injection, was prescribed Cymbalta and referred for a repeat ESI. Today, pt c/o L foot pain x /. Pt locates pain to   Swelling: Treatments tried:  Pertinent review of systems: ***  Relevant historical information: ***   Exam:  There were no vitals taken for this visit. General: Well Developed, well nourished, and in no acute distress.   MSK: ***    Lab and Radiology Results No results found for this or any previous visit (from the past 72 hour(s)). No results found.     Assessment and Plan: 76 y.o. female with ***   PDMP not reviewed this encounter. No orders of the defined types were placed in this encounter.  No orders of the defined types were placed in this encounter.    Discussed warning signs or symptoms. Please see discharge instructions. Patient expresses understanding.   ***

## 2021-09-01 ENCOUNTER — Ambulatory Visit: Payer: Medicare Other | Admitting: Family Medicine

## 2021-09-03 NOTE — Telephone Encounter (Signed)
Prior auth required for PROLIA  PA PROCESS DETAILS: Effective 01/25/2021 if the patient is new to Prolia, Prior authorization and Step Therapy are required & not on file. Please go to [email or web address] or call [phone] to initiate the prior authorization. For exception to the policy please visit [email or web address] and review Policy Number [id number] 

## 2021-09-10 NOTE — Telephone Encounter (Signed)
Pt ready for scheduling on or after 09/26/21   Out-of-pocket cost due at time of visit: $301   Primary: [employer] Medicare Prolia co-insurance: 20% (approximately $276) Admin fee co-insurance: $0   Secondary: n/a Prolia co-insurance:  Admin fee co-insurance:    Deductible: does not apply   Prior Auth: APPROVED PA# [id number] Valid: 03/24/21-03/24/22   ** This summary of benefits is an estimation of the patient's out-of-pocket cost. Exact cost may vary based on individual plan coverage.

## 2021-09-15 NOTE — Progress Notes (Unsigned)
[place] [address] [place] Phone: [phone] Subjective:   IVilma Meckel, am serving as a scribe for Dr. Hulan Saas.  I'm seeing this patient by the request  of:  [name], MD  CC: toe fracture   [id number]  08/26/2021 Improved after last injection.  We will continue to monitor.  We need to consider the possibility of viscosupplementation but has not responded extremely well in the past.  Chronic problem with worsening pain.  We will give patient Toradol and Depo-Medrol today secondary to the severity of the discomfort.  The patient has responded to epidurals previously and would like to consider another one at this time.  Patient is going nearly 4 months since her last injection.  At this point to I do feel that we should consider other medications and will start Cymbalta 20 mg.  Warned of potential side effects.  I am optimistic though it should make a significant difference.  Follow-up with me again in 4 to 6 weeks and if worsening pain seek medical attention  Update 09/16/2021 Shanty A Gloeckner is a 76 y.o. female coming in with complaint of L knee and LBP. Epidural scheduled for 09/18/2021. Patient states here for f/x toe. Happened about 2 weeks ago. Here for follow up.      Past Medical History:  Diagnosis Date   Arthritis    Bursitis    left elbow   Clostridium difficile infection    Diabetes mellitus without complication (HCC)    Frequent falls    HLD (hyperlipidemia)    Hypertension    Infectious colitis    Past Surgical History:  Procedure Laterality Date   CATARACT EXTRACTION Bilateral    COLONOSCOPY     ORIF PROXIMAL TIBIAL PLATEAU FRACTURE Left 02/19/2012   knee   POLYPECTOMY     TONSILLECTOMY     Social History   Socioeconomic History   Marital status: Divorced    Spouse name: Not on file   Number of children: 2   Years of education: Not on file   Highest education level: Associate  degree: academic program  Occupational History   Occupation: RETIRED  Tobacco Use   Smoking status: Former    Packs/day: 0.25    Years: 50.00    Total pack years: 12.50    Types: Cigarettes    Quit date: 08/28/2014    Years since quitting: 7.0   Smokeless tobacco: Never   Tobacco comments:    1 pack every 3 days  Vaping Use   Vaping Use: Never used  Substance and Sexual Activity   Alcohol use: No    Alcohol/week: 0.0 standard drinks of alcohol   Drug use: No   Sexual activity: Not Currently  Other Topics Concern   Not on file  Social History Narrative   Lives alone    Right handed   Caffeine: 3x weekly         Social Determinants of Health   Financial Resource Strain: Low Risk  (02/20/2021)   Overall Financial Resource Strain (CARDIA)    Difficulty of Paying Living Expenses: Not hard at all  Food Insecurity: No Food Insecurity (02/20/2021)   Hunger Vital Sign    Worried About Running Out of Food in the Last Year: Never true    [place] in the Last Year: Never true  Transportation Needs: No Transportation Needs (02/20/2021)   PRAPARE - Transportation    Lack of Transportation (  Medical): No    Lack of Transportation (Non-Medical): No  Physical Activity: Inactive (02/20/2021)   Exercise Vital Sign    Days of Exercise per Week: 0 days    Minutes of Exercise per Session: 0 min  Stress: No Stress Concern Present (02/20/2021)   [place]    Feeling of Stress : Not at all  Social Connections: Moderately Integrated (02/20/2021)   Social Connection and Isolation Panel [NHANES]    Frequency of Communication with Friends and Family: More than three times a week    Frequency of Social Gatherings with Friends and Family: More than three times a week    Attends Religious Services: More than 4 times per year    Active Member of [employer] or Organizations: Yes    Attends Archivist Meetings: More than 4  times per year    Marital Status: Widowed   Allergies  Allergen Reactions   Doxycycline Diarrhea   Minocycline Diarrhea   Wellbutrin [Bupropion] Swelling    Per pt her tongue was swollen and sore/symptoms stopped once the medication was discontinued.    Penicillins Rash    Has patient had a PCN reaction causing immediate rash, facial/tongue/throat swelling, SOB or lightheadedness with hypotension: no Has patient had a PCN reaction causing severe rash involving mucus membranes or skin necrosis: no Has patient had a PCN reaction that required hospitalization: unknown Has patient had a PCN reaction occurring within the last 10 years: no If all of the above answers are "NO", then may proceed with Cephalosporin use.    Z-Pak [Azithromycin] Other (See Comments)    diarrhea   Family History  Problem Relation Age of Onset   Diverticulosis Mother    Ovarian cancer Mother    Pancreatic cancer Father    Lung cancer Father    Colon polyps Sister    Colon cancer Neg Hx    Breast cancer Neg Hx     Current Outpatient Medications (Endocrine & Metabolic):    denosumab (PROLIA) 60 MG/ML SOSY injection, INJECT '60MG'$  INTO THE SKIN AS DIRECTED  Current Outpatient Medications (Cardiovascular):    amLODipine (NORVASC) 5 MG tablet, Take 1 tablet (5 mg total) by mouth daily. TAKE 1 AND 1/2 TABLETS BY MOUTH DAILY   atorvastatin (LIPITOR) 40 MG tablet, TAKE 1 TABLET(40 MG) BY MOUTH DAILY. FOLLOW-UP APPOINTMENT WITH LABS ARE DUE   hydrochlorothiazide (HYDRODIURIL) 25 MG tablet, TAKE 1 TABLET(25 MG) BY MOUTH EVERY MORNING   lisinopril (ZESTRIL) 40 MG tablet, TAKE 1 TABLET BY MOUTH DAILY  Current Outpatient Medications (Respiratory):    Camphor-Eucalyptus-Menthol (VICKS VAPORUB EX), Apply 1 application topically as needed (congestion).   fluticasone (FLONASE) 50 MCG/ACT nasal spray, Place 2 sprays into both nostrils daily.   guaifenesin (HUMIBID E) 400 MG TABS tablet, Take 1 tablet 3 times daily as needed  for chest congestion and cough   ipratropium (ATROVENT) 0.06 % nasal spray, Place 2 sprays into both nostrils 4 (four) times daily. As needed for nasal congestion, runny nose   Current Outpatient Medications (Hematological):    Cyanocobalamin (VITAMIN B-12) 2500 MCG SUBL, Place 2,500 mcg under the tongue daily.  Current Outpatient Medications (Other):    ALPRAZolam (XANAX) 0.5 MG tablet, TAKE 1 TABLET(0.5 MG) BY MOUTH AT BEDTIME AS NEEDED FOR ANXIETY   cholecalciferol (VITAMIN D) 25 MCG (1000 UNIT) tablet, Take 2,000 Units by mouth daily.   DULoxetine (CYMBALTA) 20 MG capsule, Take 1 capsule (20 mg total) by mouth  daily.   gabapentin (NEURONTIN) 100 MG capsule, Take 2 capsules (200 mg total) by mouth at bedtime. (Patient taking differently: Take 200 mg by mouth at bedtime as needed (neuropathy).)   potassium chloride (KLOR-CON) 8 MEQ tablet, TAKE 2 TABLETS(16 MEQ) BY MOUTH DAILY (Patient taking differently: Take 16 mEq by mouth daily.)   TURMERIC PO, Take 1 tablet by mouth daily.   Reviewed prior external information including notes and imaging from  primary care provider As well as notes that were available from care everywhere and other healthcare systems.  Past medical history, social, surgical and family history all reviewed in electronic medical record.  No pertanent information unless stated regarding to the chief complaint.   Review of Systems:  No headache, visual changes, nausea, vomiting, diarrhea, constipation, dizziness, abdominal pain, skin rash, fevers, chills, night sweats, weight loss, swollen lymph nodes, body aches, joint swelling, chest pain, shortness of breath, mood changes. POSITIVE muscle aches  Objective  Blood pressure 130/82, pulse 92, resp. rate (!) 94, height '5\' 5"'$  (1.651 m), weight 166 lb (75.3 kg).   General: No apparent distress alert and oriented x3 mood and affect normal, dressed appropriately.  HEENT: Pupils equal, extraocular movements intact   Respiratory: Patient's speak in full sentences and does not appear short of breath  Cardiovascular: No lower extremity edema, non tender, no erythema  Antalgic gait noted.  Left foot does have some bruising at the base of the second and third metatarsals.  Patient is tender to palpation more over the third phalanx but not over the metatarsals themselves.  Neurovascular intact distally.  Limited muscular skeletal ultrasound was performed and interpreted by Hulan Saas, M  Limited ultrasound shows a very small cortical phalanx noted at the middle portion of the third toe.  Patient does have significant hypoechoic changes of the MTP joints of the second and third toes.  Otherwise fairly unremarkable. Impression: Phalanx fracture nondisplaced with reactive capsulitis of the second and third MTP    Impression and Recommendations:     The above documentation has been reviewed and is accurate and complete Lyndal Pulley, DO

## 2021-09-16 ENCOUNTER — Encounter: Payer: Self-pay | Admitting: Family Medicine

## 2021-09-16 ENCOUNTER — Ambulatory Visit: Payer: Self-pay

## 2021-09-16 ENCOUNTER — Ambulatory Visit (INDEPENDENT_AMBULATORY_CARE_PROVIDER_SITE_OTHER): Payer: Medicare Other | Admitting: Family Medicine

## 2021-09-16 VITALS — BP 130/82 | HR 92 | Resp 94 | Ht 65.0 in | Wt 166.0 lb

## 2021-09-16 DIAGNOSIS — M79675 Pain in left toe(s): Secondary | ICD-10-CM

## 2021-09-16 DIAGNOSIS — S99922A Unspecified injury of left foot, initial encounter: Secondary | ICD-10-CM | POA: Insufficient documentation

## 2021-09-16 NOTE — Assessment & Plan Note (Signed)
Patient does have x-rays from outside facility showing a very small phalanx fracture nondisplaced noted to the third on the left foot.  We discussed with patient about icing regimen, discussed more rigid soled shoes.  Questionable buddy taping if it is more beneficial.  Patient does on ultrasound had more of a synovitis of the second and third toes that is likely getting some of the discomfort and if still around in 3 weeks we will consider injections.  Patient is already going to have epidural for the back and will be following up for [place] after that.

## 2021-09-16 NOTE — Patient Instructions (Signed)
Can transition to shoe when you feel Ice and [place] Keep next appointment

## 2021-09-18 ENCOUNTER — Ambulatory Visit
Admission: RE | Admit: 2021-09-18 | Discharge: 2021-09-18 | Disposition: A | Discharge status: Home / Self Care | Payer: Medicare Other | Source: Ambulatory Visit | Attending: Family Medicine | Admitting: Family Medicine

## 2021-09-18 DIAGNOSIS — M5416 Radiculopathy, lumbar region: Secondary | ICD-10-CM

## 2021-09-18 MED ORDER — METHYLPREDNISOLONE ACETATE 40 MG/ML INJ SUSP (RADIOLOG
80.0000 mg | Freq: Once | INTRAMUSCULAR | Status: AC
  Administered 2021-09-18: 80 mg via EPIDURAL

## 2021-09-18 MED ORDER — IOPAMIDOL (ISOVUE-M 200) INJECTION 41%
1.0000 mL | Freq: Once | INTRAMUSCULAR | Status: AC
  Administered 2021-09-18: 1 mL via EPIDURAL

## 2021-09-18 NOTE — Discharge Instructions (Signed)

## 2021-09-24 NOTE — Progress Notes (Deleted)
   I, Peterson Lombard, LAT, ATC acting as a scribe for [name], MD.  Roger Shelter Stallworth is a 76 y.o. female who presents to [place] at [facility] today for L foot pain. Pt was last seen by Dr. Tamala Julian on 09/16/21 for nondisplaced fx of the 3rd phalnx on the L foot. Today, pt reports /. Pt locates pain to   Swelling: Treatments tried:    Pertinent review of systems: ***  Relevant historical information: ***   Exam:  There were no vitals taken for this visit. General: Well Developed, well nourished, and in no acute distress.   MSK: ***    Lab and Radiology Results No results found for this or any previous visit (from the past 72 hour(s)). No results found.     Assessment and Plan: 76 y.o. female with ***   PDMP not reviewed this encounter. No orders of the defined types were placed in this encounter.  No orders of the defined types were placed in this encounter.    Discussed warning signs or symptoms. Please see discharge instructions. Patient expresses understanding.   ***

## 2021-09-25 ENCOUNTER — Ambulatory Visit: Payer: Medicare Other | Admitting: Family Medicine

## 2021-09-29 ENCOUNTER — Other Ambulatory Visit: Payer: Self-pay | Admitting: Internal Medicine

## 2021-09-30 ENCOUNTER — Ambulatory Visit: Payer: Medicare Other

## 2021-10-01 ENCOUNTER — Ambulatory Visit (INDEPENDENT_AMBULATORY_CARE_PROVIDER_SITE_OTHER): Payer: Medicare Other | Admitting: *Deleted

## 2021-10-01 DIAGNOSIS — M810 Age-related osteoporosis without current pathological fracture: Secondary | ICD-10-CM | POA: Diagnosis not present

## 2021-10-01 DIAGNOSIS — Z23 Encounter for immunization: Secondary | ICD-10-CM | POA: Diagnosis not present

## 2021-10-01 MED ORDER — DENOSUMAB 60 MG/ML ~~LOC~~ SOSY
60.0000 mg | PREFILLED_SYRINGE | Freq: Once | SUBCUTANEOUS | Status: AC
  Administered 2021-10-01: 60 mg via SUBCUTANEOUS

## 2021-10-01 NOTE — Progress Notes (Signed)
Pls cosign for PROLIA inj../lmb

## 2021-10-06 NOTE — Progress Notes (Signed)
[place] Phone: [phone] Subjective:   Kristina Ayala, am serving as a scribe for Dr. Hulan Saas.   I'm seeing this patient by the request  of:  [name], MD  CC: Back pain follow-up, foot pain and knee pain  [id number]  09/16/2021 Patient does have x-rays from outside facility showing a very small phalanx fracture nondisplaced noted to the third on the left foot.  We discussed with patient about icing regimen, discussed more rigid soled shoes.  Questionable buddy taping if it is more beneficial.  Patient does on ultrasound had more of a synovitis of the second and third toes that is likely getting some of the discomfort and if still around in 3 weeks we will consider injections.  Patient is already going to have epidural for the back and will be following up for [place] after that.  Update 10/07/2021 Kristina Ayala is a 76 y.o. female coming in with complaint of LBP, L foot and knee pain. Epidural 09/18/2021. Steroid injection for knee on 06/17/2021. Patient states that pain in foot has improved. Now having pain in MTP of great toe.  Patient states that it is on the same side of the injury of the foot  Does not feel like epidural provided her with any relief.   Knee is not bothering patient at this time.        Past Medical History:  Diagnosis Date   Arthritis    Bursitis    left elbow   Clostridium difficile infection    Diabetes mellitus without complication (HCC)    Frequent falls    HLD (hyperlipidemia)    Hypertension    Infectious colitis    Past Surgical History:  Procedure Laterality Date   CATARACT EXTRACTION Bilateral    COLONOSCOPY     ORIF PROXIMAL TIBIAL PLATEAU FRACTURE Left 02/19/2012   knee   POLYPECTOMY     TONSILLECTOMY     Social History   Socioeconomic History   Marital status: Divorced    Spouse name: Not on file   Number of children: 2   Years of  education: Not on file   Highest education level: Associate degree: academic program  Occupational History   Occupation: RETIRED  Tobacco Use   Smoking status: Former    Packs/day: 0.25    Years: 50.00    Total pack years: 12.50    Types: Cigarettes    Quit date: 08/28/2014    Years since quitting: 7.1   Smokeless tobacco: Never   Tobacco comments:    1 pack every 3 days  Vaping Use   Vaping Use: Never used  Substance and Sexual Activity   Alcohol use: Ayala    Alcohol/week: 0.0 standard drinks of alcohol   Drug use: Ayala   Sexual activity: Not Currently  Other Topics Concern   Not on file  Social History Narrative   Lives alone    Right handed   Caffeine: 3x weekly         Social Determinants of Health   Financial Resource Strain: Low Risk  (02/20/2021)   Overall Financial Resource Strain (CARDIA)    Difficulty of Paying Living Expenses: Not hard at all  Food Insecurity: Ayala Food Insecurity (02/20/2021)   Hunger Vital Sign    Worried About Running Out of Food in the Last Year: Never true    [place] in the Last Year: Never true  Transportation Needs: Ayala Transportation Needs (02/20/2021)   PRAPARE - Hydrologist (Medical): Ayala    Lack of Transportation (Non-Medical): Ayala  Physical Activity: Inactive (02/20/2021)   Exercise Vital Sign    Days of Exercise per Week: 0 days    Minutes of Exercise per Session: 0 min  Stress: Ayala Stress Concern Present (02/20/2021)   [place]    Feeling of Stress : Not at all  Social Connections: Moderately Integrated (02/20/2021)   Social Connection and Isolation Panel [NHANES]    Frequency of Communication with Friends and Family: More than three times a week    Frequency of Social Gatherings with Friends and Family: More than three times a week    Attends Religious Services: More than 4 times per year    Active Member of [employer] or Organizations:  Yes    Attends Archivist Meetings: More than 4 times per year    Marital Status: Widowed   Allergies  Allergen Reactions   Doxycycline Diarrhea   Minocycline Diarrhea   Wellbutrin [Bupropion] Swelling    Per pt her tongue was swollen and sore/symptoms stopped once the medication was discontinued.    Penicillins Rash    Has patient had a PCN reaction causing immediate rash, facial/tongue/throat swelling, SOB or lightheadedness with hypotension: Ayala Has patient had a PCN reaction causing severe rash involving mucus membranes or skin necrosis: Ayala Has patient had a PCN reaction that required hospitalization: unknown Has patient had a PCN reaction occurring within the last 10 years: Ayala If all of the above answers are "Ayala", then may proceed with Cephalosporin use.    Z-Pak [Azithromycin] Other (See Comments)    diarrhea   Family History  Problem Relation Age of Onset   Diverticulosis Mother    Ovarian cancer Mother    Pancreatic cancer Father    Lung cancer Father    Colon polyps Sister    Colon cancer Neg Hx    Breast cancer Neg Hx     Current Outpatient Medications (Endocrine & Metabolic):    denosumab (PROLIA) 60 MG/ML SOSY injection, INJECT 60 MG INTO SKIN AS DIRECTED  Current Outpatient Medications (Cardiovascular):    amLODipine (NORVASC) 5 MG tablet, Take 1 tablet (5 mg total) by mouth daily. TAKE 1 AND 1/2 TABLETS BY MOUTH DAILY   atorvastatin (LIPITOR) 40 MG tablet, TAKE 1 TABLET(40 MG) BY MOUTH DAILY. FOLLOW-UP APPOINTMENT WITH LABS ARE DUE   hydrochlorothiazide (HYDRODIURIL) 25 MG tablet, TAKE 1 TABLET(25 MG) BY MOUTH EVERY MORNING   lisinopril (ZESTRIL) 40 MG tablet, TAKE 1 TABLET BY MOUTH DAILY  Current Outpatient Medications (Respiratory):    Camphor-Eucalyptus-Menthol (VICKS VAPORUB EX), Apply 1 application topically as needed (congestion).   fluticasone (FLONASE) 50 MCG/ACT nasal spray, Place 2 sprays into both nostrils daily.   guaifenesin (HUMIBID E)  400 MG TABS tablet, Take 1 tablet 3 times daily as needed for chest congestion and cough   ipratropium (ATROVENT) 0.06 % nasal spray, Place 2 sprays into both nostrils 4 (four) times daily. As needed for nasal congestion, runny nose   Current Outpatient Medications (Hematological):    Cyanocobalamin (VITAMIN B-12) 2500 MCG SUBL, Place 2,500 mcg under the tongue daily.  Current Outpatient Medications (Other):    ALPRAZolam (XANAX) 0.5 MG tablet, TAKE 1 TABLET(0.5 MG) BY MOUTH AT BEDTIME AS NEEDED FOR ANXIETY   cholecalciferol (VITAMIN D) 25 MCG (1000 UNIT) tablet, Take 2,000 Units by  mouth daily.   DULoxetine (CYMBALTA) 20 MG capsule, Take 1 capsule (20 mg total) by mouth daily.   gabapentin (NEURONTIN) 100 MG capsule, Take 2 capsules (200 mg total) by mouth at bedtime. (Patient taking differently: Take 200 mg by mouth at bedtime as needed (neuropathy).)   potassium chloride (KLOR-CON) 8 MEQ tablet, TAKE 2 TABLETS(16 MEQ) BY MOUTH DAILY (Patient taking differently: Take 16 mEq by mouth daily.)   TURMERIC PO, Take 1 tablet by mouth daily.   Reviewed prior external information including notes and imaging from  primary care provider As well as notes that were available from care everywhere and other healthcare systems.  Past medical history, social, surgical and family history all reviewed in electronic medical record.  Ayala pertanent information unless stated regarding to the chief complaint.   Review of Systems:  Ayala headache, visual changes, nausea, vomiting, diarrhea, constipation, dizziness, abdominal pain, skin rash, fevers, chills, night sweats, weight loss, swollen lymph nodes, body aches, joint swelling, chest pain, shortness of breath, mood changes. POSITIVE muscle aches  Objective  Blood pressure 110/72, pulse 70, height '5\' 5"'$  (1.651 m), weight 168 lb (76.2 kg), SpO2 98 %.   General: Ayala apparent distress alert and oriented x3 mood and affect normal, dressed appropriately.  HEENT:  Pupils equal, extraocular movements intact  Respiratory: Patient's speak in full sentences and does not appear short of breath  Cardiovascular: Ayala lower extremity edema, non tender, Ayala erythema  Mild antalgic gait noted.  Patient's foot exam does have some tenderness to palpation noted.  Hallux limitus noted patient does have some mild discoloration of the third toe but seems to be nontender. Knee exam does have crepitus noted.  Instability with valgus and varus force.  Tender to palpation.  Limited muscular skeletal ultrasound was performed and interpreted by Hulan Saas, M  Limited ultrasound of patient's foot shows the patient does have hypoechoic changes of the second and third metatarsal joints noted.  Patient's cortical irregularity of the previous toe appears to be unremarkable.  First toe does not have any significant hypoechoic changes noted. Impression: Interval improvement of the toe fracture    Impression and Recommendations:     The above documentation has been reviewed and is accurate and complete Kristina Pulley, DO

## 2021-10-07 ENCOUNTER — Ambulatory Visit (INDEPENDENT_AMBULATORY_CARE_PROVIDER_SITE_OTHER): Payer: Medicare Other | Admitting: Family Medicine

## 2021-10-07 ENCOUNTER — Ambulatory Visit: Payer: Self-pay

## 2021-10-07 ENCOUNTER — Encounter: Payer: Self-pay | Admitting: Family Medicine

## 2021-10-07 VITALS — BP 110/72 | HR 70 | Ht 65.0 in | Wt 168.0 lb

## 2021-10-07 DIAGNOSIS — M5416 Radiculopathy, lumbar region: Secondary | ICD-10-CM | POA: Diagnosis not present

## 2021-10-07 DIAGNOSIS — M79672 Pain in left foot: Secondary | ICD-10-CM

## 2021-10-07 DIAGNOSIS — S99922A Unspecified injury of left foot, initial encounter: Secondary | ICD-10-CM

## 2021-10-07 DIAGNOSIS — M1732 Unilateral post-traumatic osteoarthritis, left knee: Secondary | ICD-10-CM

## 2021-10-07 DIAGNOSIS — K8689 Other specified diseases of pancreas: Secondary | ICD-10-CM

## 2021-10-07 NOTE — Patient Instructions (Signed)
Good to see you! No need to buddy tape anymore Rocker bottom shoes: Thin comfort, [place], [employer]ity [employer]yers ([employer])

## 2021-10-08 ENCOUNTER — Encounter: Payer: Self-pay | Admitting: Family Medicine

## 2021-10-08 NOTE — Assessment & Plan Note (Signed)
Patient is doing better at this point.  No longer necessary for buddy taping in my opinion.  Still would consider rigid soled shoes.  Discussed different shoe options.  Worsening pain could consider injections noted for the second and third metatarsal joints.  Follow-up again in 6 weeks

## 2021-10-08 NOTE — Assessment & Plan Note (Signed)
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  We can either order it or she can have primary care order it.  We are seeing patient again at the end of October to follow-up on all of her chronic conditions and can discuss then.

## 2021-10-08 NOTE — Assessment & Plan Note (Signed)
Not as much improvement with this epidural which she has had in the past.  We have discussed multiple different options.  May need to consider the possibility of increasing the Cymbalta if this continues.  Otherwise would need to consider the possibility of surgical intervention.

## 2021-10-08 NOTE — Assessment & Plan Note (Signed)
Knee is stable at this time.  No other changes necessary.  Worsening pain will consider injection again.

## 2021-10-13 NOTE — Telephone Encounter (Signed)
Last Prolia inj 10/01/21 Next Prolia inj due 04/02/22

## 2021-10-28 ENCOUNTER — Telehealth: Payer: Self-pay | Admitting: Family Medicine

## 2021-10-28 ENCOUNTER — Other Ambulatory Visit: Payer: Self-pay

## 2021-10-28 DIAGNOSIS — M5416 Radiculopathy, lumbar region: Secondary | ICD-10-CM

## 2021-10-28 MED ORDER — GABAPENTIN 100 MG PO CAPS: 200.0000 mg | ORAL_CAPSULE | Freq: Every day | ORAL | 0 refills | Status: DC

## 2021-10-28 NOTE — Telephone Encounter (Signed)
Epidural ordered and gabapentin refill sent in.

## 2021-10-28 NOTE — Telephone Encounter (Signed)
Patient called asking if another Epidural could be ordered for her?   She also asked for a refill on gabapentin (NEURONTIN) 100 MG capsule to be sent to [facility] on [address].

## 2021-11-17 NOTE — Progress Notes (Unsigned)
[place] Phone: [phone] Subjective:   Kristina Ayala, am serving as a scribe for Dr. Hulan Saas.  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  We can either order it or she can have primary care order it.  We are seeing patient again at the end of October to follow-up on all of her chronic conditions and can discuss then.  Not as much improvement with this epidural which she has had in the past.  We have discussed multiple different options.  May need to consider the possibility of increasing the Cymbalta if this continues.  Otherwise would need to consider the possibility of surgical intervention.  Patient is doing better at this point.  Ayala longer necessary for buddy taping in my opinion.  Still would consider rigid soled shoes.  Discussed different shoe options.  Worsening pain could consider injections noted for the second and third metatarsal joints.  Follow-up again in 6 weeks  Knee is stable at this time.  Ayala other changes necessary.  Worsening pain will consider injection again.  Update 11/18/2021 Kristina Ayala is a 76 y.o. female coming in with complaint of L foot and knee pain and lumbar radiculopathy. Patient states that her back and hip pain has been increasing.   Pain started in L forearm over middle of ulna. Started one week ago and can be sharp at times.       Past Medical History:  Diagnosis Date   Arthritis    Bursitis    left elbow   Clostridium difficile infection    Diabetes mellitus without complication (HCC)    Frequent falls    HLD (hyperlipidemia)    Hypertension    Infectious colitis    Past Surgical History:  Procedure Laterality Date   CATARACT EXTRACTION Bilateral     COLONOSCOPY     ORIF PROXIMAL TIBIAL PLATEAU FRACTURE Left 02/19/2012   knee   POLYPECTOMY     TONSILLECTOMY     Social History   Socioeconomic History   Marital status: Divorced    Spouse name: Not on file   Number of children: 2   Years of education: Not on file   Highest education level: Associate degree: academic program  Occupational History   Occupation: RETIRED  Tobacco Use   Smoking status: Former    Packs/day: 0.25    Years: 50.00    Total pack years: 12.50    Types: Cigarettes    Quit date: 08/28/2014    Years since quitting: 7.2   Smokeless tobacco: Never   Tobacco comments:    1 pack every 3 days  Vaping Use   Vaping Use: Never used  Substance and Sexual Activity   Alcohol use: Ayala    Alcohol/week: 0.0 standard drinks of alcohol   Drug use: Ayala   Sexual activity: Not Currently  Other Topics Concern   Not on file  Social History Narrative   Lives alone    Right handed   Caffeine: 3x weekly         Social Determinants of Health   Financial Resource Strain: Low Risk  (02/20/2021)   Overall Financial Resource Strain (CARDIA)    Difficulty of Paying Living Expenses: Not  hard at all  Food Insecurity: Ayala Food Insecurity (02/20/2021)   Hunger Vital Sign    Worried About Running Out of Food in the Last Year: Never true    Ran Out of Food in the Last Year: Never true  Transportation Needs: Ayala Transportation Needs (02/20/2021)   PRAPARE - Hydrologist (Medical): Ayala    Lack of Transportation (Non-Medical): Ayala  Physical Activity: Inactive (02/20/2021)   Exercise Vital Sign    Days of Exercise per Week: 0 days    Minutes of Exercise per Session: 0 min  Stress: Ayala Stress Concern Present (02/20/2021)   [place]    Feeling of Stress : Not at all  Social Connections: Moderately Integrated (02/20/2021)   Social Connection and Isolation Panel [NHANES]    Frequency of  Communication with Friends and Family: More than three times a week    Frequency of Social Gatherings with Friends and Family: More than three times a week    Attends Religious Services: More than 4 times per year    Active Member of [employer] or Organizations: Yes    Attends Archivist Meetings: More than 4 times per year    Marital Status: Widowed   Allergies  Allergen Reactions   Doxycycline Diarrhea   Minocycline Diarrhea   Wellbutrin [Bupropion] Swelling    Per pt her tongue was swollen and sore/symptoms stopped once the medication was discontinued.    Penicillins Rash    Has patient had a PCN reaction causing immediate rash, facial/tongue/throat swelling, SOB or lightheadedness with hypotension: Ayala Has patient had a PCN reaction causing severe rash involving mucus membranes or skin necrosis: Ayala Has patient had a PCN reaction that required hospitalization: unknown Has patient had a PCN reaction occurring within the last 10 years: Ayala If all of the above answers are "Ayala", then may proceed with Cephalosporin use.    Z-Pak [Azithromycin] Other (See Comments)    diarrhea   Family History  Problem Relation Age of Onset   Diverticulosis Mother    Ovarian cancer Mother    Pancreatic cancer Father    Lung cancer Father    Colon polyps Sister    Colon cancer Neg Hx    Breast cancer Neg Hx     Current Outpatient Medications (Endocrine & Metabolic):    denosumab (PROLIA) 60 MG/ML SOSY injection, INJECT 60 MG INTO SKIN AS DIRECTED  Current Outpatient Medications (Cardiovascular):    amLODipine (NORVASC) 5 MG tablet, Take 1 tablet (5 mg total) by mouth daily. TAKE 1 AND 1/2 TABLETS BY MOUTH DAILY   atorvastatin (LIPITOR) 40 MG tablet, TAKE 1 TABLET(40 MG) BY MOUTH DAILY. FOLLOW-UP APPOINTMENT WITH LABS ARE DUE   hydrochlorothiazide (HYDRODIURIL) 25 MG tablet, TAKE 1 TABLET(25 MG) BY MOUTH EVERY MORNING   lisinopril (ZESTRIL) 40 MG tablet, TAKE 1 TABLET BY MOUTH DAILY  Current  Outpatient Medications (Respiratory):    Camphor-Eucalyptus-Menthol (VICKS VAPORUB EX), Apply 1 application topically as needed (congestion).   fluticasone (FLONASE) 50 MCG/ACT nasal spray, Place 2 sprays into both nostrils daily.   guaifenesin (HUMIBID E) 400 MG TABS tablet, Take 1 tablet 3 times daily as needed for chest congestion and cough   ipratropium (ATROVENT) 0.06 % nasal spray, Place 2 sprays into both nostrils 4 (four) times daily. As needed for nasal congestion, runny nose   Current Outpatient Medications (Hematological):    Cyanocobalamin (VITAMIN B-12) 2500 MCG SUBL, Place 2,500  mcg under the tongue daily.  Current Outpatient Medications (Other):    ALPRAZolam (XANAX) 0.5 MG tablet, TAKE 1 TABLET(0.5 MG) BY MOUTH AT BEDTIME AS NEEDED FOR ANXIETY   cholecalciferol (VITAMIN D) 25 MCG (1000 UNIT) tablet, Take 2,000 Units by mouth daily.   DULoxetine (CYMBALTA) 20 MG capsule, Take 1 capsule (20 mg total) by mouth daily.   gabapentin (NEURONTIN) 100 MG capsule, Take 2 capsules (200 mg total) by mouth at bedtime.   potassium chloride (KLOR-CON) 8 MEQ tablet, TAKE 2 TABLETS(16 MEQ) BY MOUTH DAILY (Patient taking differently: Take 16 mEq by mouth daily.)   TURMERIC PO, Take 1 tablet by mouth daily.   Reviewed prior external information including notes and imaging from  primary care provider As well as notes that were available from care everywhere and other healthcare systems.  Past medical history, social, surgical and family history all reviewed in electronic medical record.  Ayala pertanent information unless stated regarding to the chief complaint.   Review of Systems:  Ayala headache, visual changes, nausea, vomiting, diarrhea, constipation, dizziness, abdominal pain, skin rash, fevers, chills, night sweats, weight loss, swollen lymph nodes, body aches, joint swelling, chest pain, shortness of breath, mood changes. POSITIVE muscle aches  Objective  Blood pressure 130/80, pulse 60,  height '5\' 5"'$  (1.651 m), weight 166 lb (75.3 kg), SpO2 99 %.   General: Ayala apparent distress alert and oriented x3 mood and affect normal, dressed appropriately.  HEENT: Pupils equal, extraocular movements intact  Respiratory: Patient's speak in full sentences and does not appear short of breath  Cardiovascular: Ayala lower extremity edema, non tender, Ayala erythema  Low back exam does have a positive straight leg test actually bilaterally.  4 out of 5 strength of the lower extremities but does seem to be symmetric.  Deep tendon reflexes are intact patient is neurovascularly intact equally bilaterally.   Limited muscular skeletal ultrasound was performed and interpreted by Hulan Saas, M   Limited ultrasound of patient's foot shows Ayala significant cortical irregularity noted on this previously.  Still has some callus formation and possibly some posttraumatic arthritic changes.    Impression and Recommendations:     The above documentation has been reviewed and is accurate and complete Kristina Pulley, DO

## 2021-11-18 ENCOUNTER — Ambulatory Visit (INDEPENDENT_AMBULATORY_CARE_PROVIDER_SITE_OTHER): Payer: Medicare Other | Admitting: Family Medicine

## 2021-11-18 ENCOUNTER — Encounter: Payer: Self-pay | Admitting: Family Medicine

## 2021-11-18 ENCOUNTER — Ambulatory Visit: Payer: Self-pay

## 2021-11-18 VITALS — BP 130/80 | HR 60 | Ht 65.0 in | Wt 166.0 lb

## 2021-11-18 DIAGNOSIS — S99912D Unspecified injury of left ankle, subsequent encounter: Secondary | ICD-10-CM | POA: Diagnosis not present

## 2021-11-18 DIAGNOSIS — M5416 Radiculopathy, lumbar region: Secondary | ICD-10-CM

## 2021-11-18 DIAGNOSIS — S99922A Unspecified injury of left foot, initial encounter: Secondary | ICD-10-CM | POA: Diagnosis not present

## 2021-11-18 DIAGNOSIS — M79672 Pain in left foot: Secondary | ICD-10-CM | POA: Diagnosis not present

## 2021-11-18 NOTE — Assessment & Plan Note (Signed)
Chronic problem with worsening symptoms.  Still wants to avoid any surgical intervention secondary to patient's comorbidities and social determinants of health to help her.  Discussed the duloxetine again which patient does not want to try.  We discussed the potential epidural which I was ordered and patient was noncompliant.  We will get an epidural done in the near

## 2021-11-18 NOTE — Assessment & Plan Note (Signed)
Patient has made good progress at this time.  No other significant changes at the moment.  Follow-up with me again for this problem.

## 2021-11-18 NOTE — Assessment & Plan Note (Signed)
Also significant improvement noted.  Has been wearing more supportive shoes that have been helpful.  Still avoid being barefoot for the next month.  If pain does not completely dissipate in the next 2 to 3 months I would like to see her again.

## 2021-11-18 NOTE — Patient Instructions (Addendum)
Avoid overhead activity Ice at night Foot looks good Still have to wear ugly shoes with walking Get epidural

## 2021-11-25 ENCOUNTER — Other Ambulatory Visit: Payer: Self-pay | Admitting: Internal Medicine

## 2021-11-25 DIAGNOSIS — R1900 Intra-abdominal and pelvic swelling, mass and lump, unspecified site: Secondary | ICD-10-CM

## 2021-11-25 DIAGNOSIS — K8689 Other specified diseases of pancreas: Secondary | ICD-10-CM

## 2021-12-04 ENCOUNTER — Inpatient Hospital Stay
Admission: RE | Admit: 2021-12-04 | Discharge: 2021-12-04 | Disposition: A | Discharge status: Home / Self Care | Payer: Medicare Other | Source: Ambulatory Visit | Attending: Family Medicine | Admitting: Family Medicine

## 2021-12-04 NOTE — Discharge Instructions (Signed)

## 2022-01-01 ENCOUNTER — Ambulatory Visit
Admission: RE | Admit: 2022-01-01 | Discharge: 2022-01-01 | Disposition: A | Discharge status: Home / Self Care | Payer: Medicare Other | Source: Ambulatory Visit | Attending: Family Medicine | Admitting: Family Medicine

## 2022-01-01 DIAGNOSIS — M5416 Radiculopathy, lumbar region: Secondary | ICD-10-CM

## 2022-01-01 MED ORDER — IOPAMIDOL (ISOVUE-M 200) INJECTION 41%
1.0000 mL | Freq: Once | INTRAMUSCULAR | Status: AC
  Administered 2022-01-01: 1 mL via EPIDURAL

## 2022-01-01 MED ORDER — METHYLPREDNISOLONE ACETATE 40 MG/ML INJ SUSP (RADIOLOG
80.0000 mg | Freq: Once | INTRAMUSCULAR | Status: AC
  Administered 2022-01-01: 80 mg via EPIDURAL

## 2022-01-01 NOTE — Discharge Instructions (Signed)

## 2022-01-04 ENCOUNTER — Encounter: Payer: Self-pay | Admitting: Internal Medicine

## 2022-01-04 NOTE — Patient Instructions (Addendum)
Blood work was ordered.   The lab is on the first floor.    Medications changes include :   none   An MRI of your abdomen was ordered to follow up on your pancreatic mass/cyst.  Someone will call you to schedule an appointment.    Return in about 6 months (around 07/07/2022) for follow up.   Health Maintenance, Female Adopting a healthy lifestyle and getting preventive care are important in promoting health and wellness. Ask your health care provider about: The right schedule for you to have regular tests and exams. Things you can do on your own to prevent diseases and keep yourself healthy. What should I know about diet, weight, and exercise? Eat a healthy diet  Eat a diet that includes plenty of vegetables, fruits, low-fat dairy products, and lean protein. Do not eat a lot of foods that are high in solid fats, added sugars, or sodium. Maintain a healthy weight Body mass index (BMI) is used to identify weight problems. It estimates body fat based on height and weight. Your health care provider can help determine your BMI and help you achieve or maintain a healthy weight. Get regular exercise Get regular exercise. This is one of the most important things you can do for your health. Most adults should: Exercise for at least 150 minutes each week. The exercise should increase your heart rate and make you sweat (moderate-intensity exercise). Do strengthening exercises at least twice a week. This is in addition to the moderate-intensity exercise. Spend less time sitting. Even light physical activity can be beneficial. Watch cholesterol and blood lipids Have your blood tested for lipids and cholesterol at 77 years of age, then have this test every 5 years. Have your cholesterol levels checked more often if: Your lipid or cholesterol levels are high. You are older than 77 years of age. You are at high risk for heart disease. What should I know about cancer screening? Depending  on your health history and family history, you may need to have cancer screening at various ages. This may include screening for: Breast cancer. Cervical cancer. Colorectal cancer. Skin cancer. Lung cancer. What should I know about heart disease, diabetes, and high blood pressure? Blood pressure and heart disease High blood pressure causes heart disease and increases the risk of stroke. This is more likely to develop in people who have high blood pressure readings or are overweight. Have your blood pressure checked: Every 3-5 years if you are 52-88 years of age. Every year if you are 27 years old or older. Diabetes Have regular diabetes screenings. This checks your fasting blood sugar level. Have the screening done: Once every three years after age 25 if you are at a normal weight and have a low risk for diabetes. More often and at a younger age if you are overweight or have a high risk for diabetes. What should I know about preventing infection? Hepatitis B If you have a higher risk for hepatitis B, you should be screened for this virus. Talk with your health care provider to find out if you are at risk for hepatitis B infection. Hepatitis C Testing is recommended for: Everyone born from [birth year] through 1966. Anyone with known risk factors for hepatitis C. Sexually transmitted infections (STIs) Get screened for STIs, including gonorrhea and chlamydia, if: You are sexually active and are younger than 77 years of age. You are older than 77 years of age and your health care provider tells you  that you are at risk for this type of infection. Your sexual activity has changed since you were last screened, and you are at increased risk for chlamydia or gonorrhea. Ask your health care provider if you are at risk. Ask your health care provider about whether you are at high risk for HIV. Your health care provider may recommend a prescription medicine to help prevent HIV infection. If you choose to  take medicine to prevent HIV, you should first get tested for HIV. You should then be tested every 3 months for as long as you are taking the medicine. Pregnancy If you are about to stop having your period (premenopausal) and you may become pregnant, seek counseling before you get pregnant. Take 400 to 800 micrograms (mcg) of folic acid every day if you become pregnant. Ask for birth control (contraception) if you want to prevent pregnancy. Osteoporosis and menopause Osteoporosis is a disease in which the bones lose minerals and strength with aging. This can result in bone fractures. If you are 8 years old or older, or if you are at risk for osteoporosis and fractures, ask your health care provider if you should: Be screened for bone loss. Take a calcium or vitamin D supplement to lower your risk of fractures. Be given hormone replacement therapy (HRT) to treat symptoms of menopause. Follow these instructions at home: Alcohol use Do not drink alcohol if: Your health care provider tells you not to drink. You are pregnant, may be pregnant, or are planning to become pregnant. If you drink alcohol: Limit how much you have to: 0-1 drink a day. Know how much alcohol is in your drink. In the [place] one drink equals one 12 oz bottle of beer (355 mL), one 5 oz glass of wine (148 mL), or one 1 oz glass of hard liquor (44 mL). Lifestyle Do not use any products that contain nicotine or tobacco. These products include cigarettes, chewing tobacco, and vaping devices, such as e-cigarettes. If you need help quitting, ask your health care provider. Do not use street drugs. Do not share needles. Ask your health care provider for help if you need support or information about quitting drugs. General instructions Schedule regular health, dental, and eye exams. Stay current with your vaccines. Tell your health care provider if: You often feel depressed. You have ever been abused or do not feel safe at  home. Summary Adopting a healthy lifestyle and getting preventive care are important in promoting health and wellness. Follow your health care provider's instructions about healthy diet, exercising, and getting tested or screened for diseases. Follow your health care provider's instructions on monitoring your cholesterol and blood pressure. This information is not intended to replace advice given to you by your health care provider. Make sure you discuss any questions you have with your health care provider. Document Revised: 06/02/2020 Document Reviewed: 06/02/2020 Elsevier Patient Education  2023 [employer].

## 2022-01-04 NOTE — Progress Notes (Unsigned)
Subjective:    Patient ID: Kristina Ayala, female    DOB: 06/23/1945, 76 y.o.   MRN: [id number]      HPI Kristina Ayala is here for a Physical exam.   Not taking calcium  Just had an epidural for back - helped.  Just saw gyn.    Medications and allergies reviewed with patient and updated if appropriate.  Current Outpatient Medications on File Prior to Visit  Medication Sig Dispense Refill   amLODipine (NORVASC) 10 MG tablet      atorvastatin (LIPITOR) 40 MG tablet TAKE 1 TABLET(40 MG) BY MOUTH DAILY. FOLLOW-UP APPOINTMENT WITH LABS ARE DUE 30 tablet 2   Camphor-Eucalyptus-Menthol (VICKS VAPORUB EX) Apply 1 application topically as needed (congestion).     cholecalciferol (VITAMIN D) 25 MCG (1000 UNIT) tablet Take 2,000 Units by mouth daily.     Cyanocobalamin (VITAMIN B-12) 2500 MCG SUBL Place 2,500 mcg under the tongue daily.     denosumab (PROLIA) 60 MG/ML SOSY injection INJECT 60 MG INTO SKIN AS DIRECTED 1 mL 0   fluticasone (FLONASE) 50 MCG/ACT nasal spray Place 2 sprays into both nostrils daily. 16 g 6   gabapentin (NEURONTIN) 100 MG capsule Take 2 capsules (200 mg total) by mouth at bedtime. 180 capsule 0   hydrochlorothiazide (HYDRODIURIL) 25 MG tablet TAKE 1 TABLET(25 MG) BY MOUTH EVERY MORNING 90 tablet 3   ipratropium (ATROVENT) 0.06 % nasal spray Place 2 sprays into both nostrils 4 (four) times daily. As needed for nasal congestion, runny nose 15 mL 0   lisinopril (ZESTRIL) 40 MG tablet TAKE 1 TABLET BY MOUTH DAILY 100 tablet 2   potassium chloride (KLOR-CON) 10 MEQ tablet Take 10 mEq by mouth daily.     TURMERIC PO Take 1 tablet by mouth daily.     ALPRAZolam (XANAX) 0.5 MG tablet TAKE 1 TABLET(0.5 MG) BY MOUTH AT BEDTIME AS NEEDED FOR ANXIETY 30 tablet 0   No current facility-administered medications on file prior to visit.    Review of Systems  Constitutional:  Negative for fever.  Eyes:  Negative for visual disturbance.  Respiratory:  Negative for cough,  shortness of breath and wheezing.   Cardiovascular:  Negative for chest pain, palpitations and leg swelling.  Gastrointestinal:  Negative for abdominal pain, blood in stool, constipation, diarrhea and nausea.       Occ gerd  Genitourinary:  Negative for dysuria.  Musculoskeletal:  Positive for arthralgias and back pain.  Skin:  Negative for rash.  Neurological:  Negative for dizziness, light-headedness and headaches.  Psychiatric/Behavioral:  Negative for dysphoric mood. The patient is nervous/anxious.        Objective:   Vitals:   01/05/22 0845  BP: 128/80  Pulse: 79  Temp: 98.1 F (36.7 C)  SpO2: 98%   Filed Weights   01/05/22 0845  Weight: 167 lb (75.8 kg)   Body mass index is 27.79 kg/m.  BP Readings from Last 3 Encounters:  01/05/22 128/80  01/01/22 (!) 143/80  11/18/21 130/80    Wt Readings from Last 3 Encounters:  01/05/22 167 lb (75.8 kg)  11/18/21 166 lb (75.3 kg)  10/07/21 168 lb (76.2 kg)       Physical Exam Constitutional: She appears well-developed and well-nourished. No distress.  HENT:  Head: Normocephalic and atraumatic.  Right Ear: External ear normal. Normal ear canal and TM Left Ear: External ear normal.  Normal ear canal and TM Mouth/Throat: Oropharynx is clear and moist.  Eyes: Conjunctivae normal.  Neck: Neck supple. No tracheal deviation present. No thyromegaly present.  No carotid bruit  Cardiovascular: Normal rate, regular rhythm and normal heart sounds.   No murmur heard.  No edema. Pulmonary/Chest: Effort normal and breath sounds normal. No respiratory distress. She has no wheezes. She has no rales.  Breast: deferred   Abdominal: Soft. She exhibits no distension. There is no tenderness.  Lymphadenopathy: She has no cervical adenopathy.  Skin: Skin is warm and dry. She is not diaphoretic.  Psychiatric: She has a normal mood and affect. Her behavior is normal.     Lab Results  Component Value Date   WBC 6.2 03/27/2021   HGB  13.1 03/27/2021   HCT 41.0 03/27/2021   PLT 297.0 03/27/2021   GLUCOSE 76 03/27/2021   CHOL 173 09/01/2020   TRIG 95.0 09/01/2020   HDL 51.60 09/01/2020   LDLDIRECT 213.1 05/22/2012   LDLCALC 102 (H) 09/01/2020   ALT 27 03/27/2021   AST 21 03/27/2021   NA 141 03/27/2021   K 3.5 03/27/2021   CL 103 03/27/2021   CREATININE 0.88 03/27/2021   BUN 17 03/27/2021   CO2 27 03/27/2021   TSH 0.62 03/27/2021   HGBA1C 6.3 03/27/2021         Assessment & Plan:   Physical exam: Screening blood work  ordered [facility]  [facility]  advised to work on weight loss Substance abuse  none   Reviewed recommended immunizations.   Health Maintenance  Topic Date Due   FOOT EXAM  Never done   Diabetic kidney evaluation - Urine ACR  Never done   OPHTHALMOLOGY EXAM  09/03/2021   HEMOGLOBIN A1C  09/27/2021   COVID-19 Vaccine (5 - 2023-24 season) 01/21/2022 (Originally 09/25/2021)   Diabetic kidney evaluation - eGFR measurement  03/28/2022   Medicare Annual Wellness (AWV)  08/25/2022   DEXA SCAN  09/02/2022   DTaP/Tdap/Td (2 - Td or Tdap) 07/28/2024   Pneumonia Vaccine 29+ Years old  Completed   INFLUENZA VACCINE  Completed   Hepatitis C Screening  Completed   HPV VACCINES  Aged Out   COLONOSCOPY (Pts 45-54yr Insurance coverage will need to be confirmed)  Discontinued   Zoster Vaccines- Shingrix  Discontinued          See Problem List for Assessment and Plan of chronic medical problems.

## 2022-01-05 ENCOUNTER — Ambulatory Visit (INDEPENDENT_AMBULATORY_CARE_PROVIDER_SITE_OTHER): Payer: Medicare Other | Admitting: Internal Medicine

## 2022-01-05 VITALS — BP 128/80 | HR 79 | Temp 98.1°F | Ht 65.0 in | Wt 167.0 lb

## 2022-01-05 DIAGNOSIS — F419 Anxiety disorder, unspecified: Secondary | ICD-10-CM | POA: Diagnosis not present

## 2022-01-05 DIAGNOSIS — E782 Mixed hyperlipidemia: Secondary | ICD-10-CM | POA: Diagnosis not present

## 2022-01-05 DIAGNOSIS — E876 Hypokalemia: Secondary | ICD-10-CM

## 2022-01-05 DIAGNOSIS — M810 Age-related osteoporosis without current pathological fracture: Secondary | ICD-10-CM

## 2022-01-05 DIAGNOSIS — E119 Type 2 diabetes mellitus without complications: Secondary | ICD-10-CM | POA: Diagnosis not present

## 2022-01-05 DIAGNOSIS — Z0000 Encounter for general adult medical examination without abnormal findings: Secondary | ICD-10-CM

## 2022-01-05 DIAGNOSIS — I10 Essential (primary) hypertension: Secondary | ICD-10-CM

## 2022-01-05 DIAGNOSIS — K8689 Other specified diseases of pancreas: Secondary | ICD-10-CM

## 2022-01-05 LAB — LIPID PANEL
Cholesterol: 163 mg/dL (ref 0–200)
HDL: 59.3 mg/dL (ref >39.00)
LDL Cholesterol: 86 mg/dL (ref 0–99)
NonHDL: 104.16
Total CHOL/HDL Ratio: 3
Triglycerides: 89 mg/dL (ref 0.0–149.0)
VLDL: 17.8 mg/dL (ref 0.0–40.0)

## 2022-01-05 LAB — COMPREHENSIVE METABOLIC PANEL
ALT: 28 U/L (ref 0–35)
AST: 19 U/L (ref 0–37)
Albumin: 4.5 g/dL (ref 3.5–5.2)
Alkaline Phosphatase: 52 U/L (ref 39–117)
BUN: 20 mg/dL (ref 6–23)
CO2: 30 mEq/L (ref 19–32)
Calcium: 9.7 mg/dL (ref 8.4–10.5)
Chloride: 101 mEq/L (ref 96–112)
Creatinine, Ser: 0.86 mg/dL (ref 0.40–1.20)
GFR: 64.98 mL/min (ref >60.00)
Glucose, Bld: 70 mg/dL (ref 70–99)
Potassium: 3.6 mEq/L (ref 3.5–5.1)
Sodium: 140 mEq/L (ref 135–145)
Total Bilirubin: 0.5 mg/dL (ref 0.2–1.2)
Total Protein: 7.4 g/dL (ref 6.0–8.3)

## 2022-01-05 LAB — MICROALBUMIN / CREATININE URINE RATIO
Creatinine,U: 55.1 mg/dL
Microalb Creat Ratio: 1.3 mg/g (ref 0.0–30.0)
Microalb, Ur: <0.7 mg/dL (ref 0.0–1.9)

## 2022-01-05 LAB — CBC WITH DIFFERENTIAL/PLATELET
Basophils Absolute: 0.1 10*3/uL (ref 0.0–0.1)
Basophils Relative: 1 % (ref 0.0–3.0)
Eosinophils Absolute: 0 10*3/uL (ref 0.0–0.7)
Eosinophils Relative: 0.3 % (ref 0.0–5.0)
HCT: 42.7 % (ref 36.0–46.0)
Hemoglobin: 13.8 g/dL (ref 12.0–15.0)
Lymphocytes Relative: 31.1 % (ref 12.0–46.0)
Lymphs Abs: 3.7 10*3/uL (ref 0.7–4.0)
MCHC: 32.2 g/dL (ref 30.0–36.0)
MCV: 82.4 fl (ref 78.0–100.0)
Monocytes Absolute: 1.2 10*3/uL — ABNORMAL HIGH (ref 0.1–1.0)
Monocytes Relative: 10.6 % (ref 3.0–12.0)
Neutro Abs: 6.7 10*3/uL (ref 1.4–7.7)
Neutrophils Relative %: 57 % (ref 43.0–77.0)
Platelets: 296 10*3/uL (ref 150.0–400.0)
RBC: 5.18 Mil/uL — ABNORMAL HIGH (ref 3.87–5.11)
RDW: 15.9 % — ABNORMAL HIGH (ref 11.5–15.5)
WBC: 11.7 10*3/uL — ABNORMAL HIGH (ref 4.0–10.5)

## 2022-01-05 LAB — HEMOGLOBIN A1C: Hgb A1c MFr Bld: 6.4 % (ref 4.6–6.5)

## 2022-01-05 LAB — TSH: TSH: 0.64 u[IU]/mL (ref 0.35–5.50)

## 2022-01-05 LAB — VITAMIN D 25 HYDROXY (VIT D DEFICIENCY, FRACTURES): VITD: 53.36 ng/mL (ref 30.00–100.00)

## 2022-01-05 NOTE — Assessment & Plan Note (Signed)
Chronic   Lab Results  Component Value Date   HGBA1C 6.3 03/27/2021   Sugars well controlled Not testing sugars at home Check A1c, urine microalbumin today Continue lifestyle control Stressed regular exercise, diabetic diet

## 2022-01-05 NOTE — Assessment & Plan Note (Signed)
Chronic Controlled, Stable Continue alprazolam 0.5 mg nightly as needed-does not take often

## 2022-01-05 NOTE — Assessment & Plan Note (Signed)
Last MR 2 years ago - follow up MR advised after 2 years - due now MR Abdomen ordered

## 2022-01-05 NOTE — Assessment & Plan Note (Addendum)
Chronic On Prolia-continue every 6 months-due in 3 months DEXA up-to-date-next 1Due 08/2022 Stressed regular exercise to go back pain Continue vitamin D supplementation Not currently taking any calcium supplementation-advised taking some calcium and trying to eat a calcium rich diet Check vitamin D level

## 2022-01-05 NOTE — Assessment & Plan Note (Addendum)
Chronic Blood pressure well controlled CMP Continue amlodipine 10 mg daily, HCTZ 25 mg daily, lisinopril 40 mg daily

## 2022-01-05 NOTE — Assessment & Plan Note (Addendum)
Chronic Likely related to hydrochlorothiazide Continue potassium 10 mill equivalents daily-will adjust dose if needed CMP

## 2022-01-05 NOTE — Assessment & Plan Note (Signed)
Chronic Regular exercise and healthy diet encouraged Check lipid panel  Continue atorvastatin 40 mg daily 

## 2022-01-14 NOTE — Progress Notes (Deleted)
[name] Phone: [phone] Subjective:    I'm seeing this patient by the request  of:  [name], MD  CC: left foot and low back pain   ION:[id number]  11/18/2021 Also significant improvement noted.  Has been wearing more supportive shoes that have been helpful.  Still avoid being barefoot for the next month.  If pain does not completely dissipate in the next 2 to 3 months I would like to see her again.      Patient has made good progress at this time.  No other significant changes at the moment.  Follow-up with me again for this problem.      Chronic problem with worsening symptoms.  Still wants to avoid any surgical intervention secondary to patient's comorbidities and social determinants of health to help her.  Discussed the duloxetine again which patient does not want to try.  We discussed the potential epidural which I was ordered and patient was noncompliant.  We will get an epidural done in the near     Update 01/22/2022 Kristina Ayala is a 76 y.o. female coming in with complaint of L foot and LBP. Patient states        Past Medical History:  Diagnosis Date   Arthritis    Bursitis    left elbow   Clostridium difficile infection    Diabetes mellitus without complication (HCC)    Frequent falls    HLD (hyperlipidemia)    Hypertension    Infectious colitis    Past Surgical History:  Procedure Laterality Date   CATARACT EXTRACTION Bilateral    COLONOSCOPY     ORIF PROXIMAL TIBIAL PLATEAU FRACTURE Left 02/19/2012   knee   POLYPECTOMY     TONSILLECTOMY     Social History   Socioeconomic History   Marital status: Divorced    Spouse name: Not on file   Number of children: 2   Years of education: Not on file   Highest education level: Associate degree: academic program  Occupational History   Occupation: RETIRED  Tobacco Use   Smoking status: Former    Packs/day: 0.25     Years: 50.00    Total pack years: 12.50    Types: Cigarettes    Quit date: 08/28/2014    Years since quitting: 7.4   Smokeless tobacco: Never   Tobacco comments:    1 pack every 3 days  Vaping Use   Vaping Use: Never used  Substance and Sexual Activity   Alcohol use: No    Alcohol/week: 0.0 standard drinks of alcohol   Drug use: No   Sexual activity: Not Currently  Other Topics Concern   Not on file  Social History Narrative   Lives alone    Right handed   Caffeine: 3x weekly         Social Determinants of Health   Financial Resource Strain: Low Risk  (02/20/2021)   Overall Financial Resource Strain (CARDIA)    Difficulty of Paying Living Expenses: Not hard at all  Food Insecurity: No Food Insecurity (02/20/2021)   Hunger Vital Sign    Worried About Running Out of Food in the Last Year: Never true    Hazel in the Last Year: Never true  Transportation Needs: No Transportation Needs (02/20/2021)   PRAPARE - Hydrologist (Medical): No    Lack of Transportation (Non-Medical): No  Physical Activity:  Inactive (02/20/2021)   Exercise Vital Sign    Days of Exercise per Week: 0 days    Minutes of Exercise per Session: 0 min  Stress: No Stress Concern Present (02/20/2021)   [place]    Feeling of Stress : Not at all  Social Connections: Moderately Integrated (02/20/2021)   Social Connection and Isolation Panel [NHANES]    Frequency of Communication with Friends and Family: More than three times a week    Frequency of Social Gatherings with Friends and Family: More than three times a week    Attends Religious Services: More than 4 times per year    Active Member of [employer] or Organizations: Yes    Attends Archivist Meetings: More than 4 times per year    Marital Status: Widowed   Allergies  Allergen Reactions   Doxycycline Diarrhea   Minocycline Diarrhea    Wellbutrin [Bupropion] Swelling    Per pt her tongue was swollen and sore/symptoms stopped once the medication was discontinued.    Penicillins Rash    Has patient had a PCN reaction causing immediate rash, facial/tongue/throat swelling, SOB or lightheadedness with hypotension: no Has patient had a PCN reaction causing severe rash involving mucus membranes or skin necrosis: no Has patient had a PCN reaction that required hospitalization: unknown Has patient had a PCN reaction occurring within the last 10 years: no If all of the above answers are "NO", then may proceed with Cephalosporin use.    Z-Pak [Azithromycin] Other (See Comments)    diarrhea   Family History  Problem Relation Age of Onset   Diverticulosis Mother    Ovarian cancer Mother    Pancreatic cancer Father    Lung cancer Father    Colon polyps Sister    Colon cancer Neg Hx    Breast cancer Neg Hx     Current Outpatient Medications (Endocrine & Metabolic):    denosumab (PROLIA) 60 MG/ML SOSY injection, INJECT 60 MG INTO SKIN AS DIRECTED  Current Outpatient Medications (Cardiovascular):    amLODipine (NORVASC) 10 MG tablet,    atorvastatin (LIPITOR) 40 MG tablet, TAKE 1 TABLET(40 MG) BY MOUTH DAILY. FOLLOW-UP APPOINTMENT WITH LABS ARE DUE   hydrochlorothiazide (HYDRODIURIL) 25 MG tablet, TAKE 1 TABLET(25 MG) BY MOUTH EVERY MORNING   lisinopril (ZESTRIL) 40 MG tablet, TAKE 1 TABLET BY MOUTH DAILY  Current Outpatient Medications (Respiratory):    Camphor-Eucalyptus-Menthol (VICKS VAPORUB EX), Apply 1 application topically as needed (congestion).   fluticasone (FLONASE) 50 MCG/ACT nasal spray, Place 2 sprays into both nostrils daily.   ipratropium (ATROVENT) 0.06 % nasal spray, Place 2 sprays into both nostrils 4 (four) times daily. As needed for nasal congestion, runny nose   Current Outpatient Medications (Hematological):    Cyanocobalamin (VITAMIN B-12) 2500 MCG SUBL, Place 2,500 mcg under the tongue  daily.  Current Outpatient Medications (Other):    ALPRAZolam (XANAX) 0.5 MG tablet, TAKE 1 TABLET(0.5 MG) BY MOUTH AT BEDTIME AS NEEDED FOR ANXIETY   cholecalciferol (VITAMIN D) 25 MCG (1000 UNIT) tablet, Take 2,000 Units by mouth daily.   gabapentin (NEURONTIN) 100 MG capsule, Take 2 capsules (200 mg total) by mouth at bedtime.   potassium chloride (KLOR-CON) 10 MEQ tablet, Take 10 mEq by mouth daily.   TURMERIC PO, Take 1 tablet by mouth daily.   Reviewed prior external information including notes and imaging from  primary care provider As well as notes that were available from care everywhere  and other healthcare systems.  Past medical history, social, surgical and family history all reviewed in electronic medical record.  No pertanent information unless stated regarding to the chief complaint.   Review of Systems:  No headache, visual changes, nausea, vomiting, diarrhea, constipation, dizziness, abdominal pain, skin rash, fevers, chills, night sweats, weight loss, swollen lymph nodes, body aches, joint swelling, chest pain, shortness of breath, mood changes. POSITIVE muscle aches  Objective  There were no vitals taken for this visit.   General: No apparent distress alert and oriented x3 mood and affect normal, dressed appropriately.  HEENT: Pupils equal, extraocular movements intact  Respiratory: Patient's speak in full sentences and does not appear short of breath  Cardiovascular: No lower extremity edema, non tender, no erythema      Impression and Recommendations:    The above documentation has been reviewed and is accurate and complete Kristina Pulley, DO

## 2022-01-21 ENCOUNTER — Ambulatory Visit: Payer: Medicare Other | Admitting: Family Medicine

## 2022-01-21 ENCOUNTER — Encounter (HOSPITAL_COMMUNITY): Payer: Self-pay

## 2022-01-21 ENCOUNTER — Emergency Department (HOSPITAL_COMMUNITY)
Admission: EM | Admit: 2022-01-21 | Discharge: 2022-01-21 | Disposition: A | Discharge status: Home / Self Care | Payer: Medicare Other | Attending: Emergency Medicine | Admitting: Emergency Medicine

## 2022-01-21 ENCOUNTER — Telehealth: Payer: Self-pay | Admitting: Internal Medicine

## 2022-01-21 ENCOUNTER — Other Ambulatory Visit: Payer: Self-pay

## 2022-01-21 DIAGNOSIS — R197 Diarrhea, unspecified: Secondary | ICD-10-CM | POA: Insufficient documentation

## 2022-01-21 DIAGNOSIS — I10 Essential (primary) hypertension: Secondary | ICD-10-CM | POA: Diagnosis not present

## 2022-01-21 DIAGNOSIS — E119 Type 2 diabetes mellitus without complications: Secondary | ICD-10-CM | POA: Diagnosis not present

## 2022-01-21 DIAGNOSIS — R110 Nausea: Secondary | ICD-10-CM | POA: Diagnosis not present

## 2022-01-21 DIAGNOSIS — Z79899 Other long term (current) drug therapy: Secondary | ICD-10-CM | POA: Diagnosis not present

## 2022-01-21 LAB — URINALYSIS, ROUTINE W REFLEX MICROSCOPIC
Bilirubin Urine: NEGATIVE
Glucose, UA: NEGATIVE mg/dL
Ketones, ur: NEGATIVE mg/dL
Nitrite: NEGATIVE
Protein, ur: NEGATIVE mg/dL
Specific Gravity, Urine: 1.03 (ref 1.005–1.030)
pH: 6 (ref 5.0–8.0)

## 2022-01-21 LAB — COMPREHENSIVE METABOLIC PANEL
ALT: 33 U/L (ref 0–44)
AST: 28 U/L (ref 15–41)
Albumin: 3.9 g/dL (ref 3.5–5.0)
Alkaline Phosphatase: 39 U/L (ref 38–126)
Anion gap: 7 (ref 5–15)
BUN: 10 mg/dL (ref 8–23)
CO2: 23 mmol/L (ref 22–32)
Calcium: 8.1 mg/dL — ABNORMAL LOW (ref 8.9–10.3)
Chloride: 112 mmol/L — ABNORMAL HIGH (ref 98–111)
Creatinine, Ser: 0.57 mg/dL (ref 0.44–1.00)
GFR, Estimated: >60 mL/min (ref >60)
Glucose, Bld: 94 mg/dL (ref 70–99)
Potassium: 3.4 mmol/L — ABNORMAL LOW (ref 3.5–5.1)
Sodium: 142 mmol/L (ref 135–145)
Total Bilirubin: 0.6 mg/dL (ref 0.3–1.2)
Total Protein: 6.7 g/dL (ref 6.5–8.1)

## 2022-01-21 LAB — CBC
HCT: 43.6 % (ref 36.0–46.0)
Hemoglobin: 13.5 g/dL (ref 12.0–15.0)
MCH: 26.3 pg (ref 26.0–34.0)
MCHC: 31 g/dL (ref 30.0–36.0)
MCV: 84.8 fL (ref 80.0–100.0)
Platelets: 188 10*3/uL (ref 150–400)
RBC: 5.14 MIL/uL — ABNORMAL HIGH (ref 3.87–5.11)
RDW: 15.7 % — ABNORMAL HIGH (ref 11.5–15.5)
WBC: 7.3 10*3/uL (ref 4.0–10.5)
nRBC: 0 % (ref 0.0–0.2)

## 2022-01-21 LAB — LIPASE, BLOOD: Lipase: 46 U/L (ref 11–51)

## 2022-01-21 MED ORDER — ONDANSETRON HCL 4 MG PO TABS: 4.0000 mg | ORAL_TABLET | Freq: Four times a day (QID) | ORAL | 0 refills | Status: DC | PRN

## 2022-01-21 MED ORDER — ONDANSETRON 4 MG PO TBDP
4.0000 mg | ORAL_TABLET | Freq: Once | ORAL | Status: AC | PRN
  Administered 2022-01-21: 4 mg via ORAL
  Filled 2022-01-21: qty 1

## 2022-01-21 NOTE — Discharge Instructions (Addendum)
Return to the ED with any new or worsening signs or symptoms Please begin taking Zofran every 6 hours as needed for nausea Please read attached guide concerning food choices to relieve diarrhea Please follow-up with your PCP for further management

## 2022-01-21 NOTE — ED Provider Notes (Signed)
[facility] DEPT Provider Note   CSN: [id number] Arrival date & time: 01/21/22  1012     History  Chief Complaint  Patient presents with   Abdominal Pain   Emesis   Diarrhea    Kristina Ayala is a 76 y.o. female with medical history of arthritis, diabetes, hypertension.  Patient complains of abdominal cramping, nausea without vomiting, diarrhea.  Patient reports symptoms began last night.  Patient states that she had just eaten fish.  Patient reports that she typically eats fish.  Patient states that over the course of the night she had multiple instances of diarrhea along with nausea without vomiting.  Patient states she also experienced abdominal cramping only when she felt nauseous. Patient denies any fevers, dysuria, flank pain.  Patient denies any blood in stool.  Patient denies history of abdominal surgeries or bowel obstructions.  Patient states she is still passing gas.  Patient denies starting any new medications recently.   Abdominal Pain Associated symptoms: diarrhea and nausea   Associated symptoms: no dysuria, no fever and no vomiting   Emesis Associated symptoms: diarrhea   Associated symptoms: no abdominal pain and no fever   Diarrhea Associated symptoms: no abdominal pain, no fever and no vomiting        Home Medications Prior to Admission medications   Medication Sig Start Date End Date Taking? Authorizing Provider  ondansetron (ZOFRAN) 4 MG tablet Take 1 tablet (4 mg total) by mouth every 6 (six) hours as needed for nausea or vomiting. 01/21/22  Yes Azucena Cecil, PA-C  ALPRAZolam (XANAX) 0.5 MG tablet TAKE 1 TABLET(0.5 MG) BY MOUTH AT BEDTIME AS NEEDED FOR ANXIETY 01/20/21   Janith Lima, MD  amLODipine (NORVASC) 10 MG tablet     [provider]  atorvastatin (LIPITOR) 40 MG tablet TAKE 1 TABLET(40 MG) BY MOUTH DAILY. FOLLOW-UP APPOINTMENT WITH LABS ARE DUE 07/17/21   [name], MD   Camphor-Eucalyptus-Menthol [facility] VAPORUB EX) Apply 1 application topically as needed (congestion).    [provider]  cholecalciferol (VITAMIN D) 25 MCG (1000 UNIT) tablet Take 2,000 Units by mouth daily.    [provider]  Cyanocobalamin (VITAMIN B-12) 2500 MCG SUBL Place 2,500 mcg under the tongue daily.    [provider]  denosumab (PROLIA) 60 MG/ML SOSY injection INJECT 60 MG INTO SKIN AS DIRECTED 09/29/21   [name], MD  fluticasone (FLONASE) 50 MCG/ACT nasal spray Place 2 sprays into both nostrils daily. 10/07/20   [name], MD  gabapentin (NEURONTIN) 100 MG capsule Take 2 capsules (200 mg total) by mouth at bedtime. 10/28/21   Lyndal Pulley, DO  hydrochlorothiazide (HYDRODIURIL) 25 MG tablet TAKE 1 TABLET(25 MG) BY MOUTH EVERY MORNING 03/09/21   Burns, Claudina Lick, MD  ipratropium (ATROVENT) 0.06 % nasal spray Place 2 sprays into both nostrils 4 (four) times daily. As needed for nasal congestion, runny nose 04/13/21   Lynden Oxford Scales, PA-C  lisinopril (ZESTRIL) 40 MG tablet TAKE 1 TABLET BY MOUTH DAILY 06/25/21   [name], MD  potassium chloride (KLOR-CON) 10 MEQ tablet Take 10 mEq by mouth daily. 12/30/21   [provider]  TURMERIC PO Take 1 tablet by mouth daily.    [provider]      Allergies    Doxycycline, Minocycline, Wellbutrin [bupropion], Penicillins, and Z-pak [azithromycin]    Review of Systems   Review of Systems  Constitutional:  Negative for fever.  Gastrointestinal:  Positive for diarrhea and nausea. Negative for abdominal pain, blood in stool and vomiting.  Genitourinary:  Negative for dysuria and flank pain.  All other systems reviewed and are negative.   Physical Exam Updated Vital Signs BP 124/87   Pulse 88   Temp 98.3 F (36.8 C) (Oral)   Resp 18   Ht '5\' 5"'$  (1.651 m)   Wt 76.2 kg   SpO2 98%   BMI 27.96 kg/m  Physical Exam Vitals and nursing note reviewed.  Constitutional:       General: She is not in acute distress.    Appearance: She is well-developed. She is not ill-appearing, toxic-appearing or diaphoretic.  HENT:     Head: Normocephalic and atraumatic.     Nose: Nose normal.     Mouth/Throat:     Mouth: Mucous membranes are moist.     Pharynx: Oropharynx is clear.  Eyes:     Extraocular Movements: Extraocular movements intact.     Conjunctiva/sclera: Conjunctivae normal.     Pupils: Pupils are equal, round, and reactive to light.  Cardiovascular:     Rate and Rhythm: Normal rate and regular rhythm.  Pulmonary:     Effort: Pulmonary effort is normal.     Breath sounds: Normal breath sounds. No wheezing.  Abdominal:     General: Abdomen is flat. Bowel sounds are normal. There is no distension.     Palpations: Abdomen is soft.     Tenderness: There is no abdominal tenderness.     Comments: No abdominal tenderness noted on palpation  Musculoskeletal:     Cervical back: Normal range of motion and neck supple. No tenderness.  Skin:    General: Skin is warm and dry.     Capillary Refill: Capillary refill takes less than 2 seconds.  Neurological:     Mental Status: She is alert and oriented to person, place, and time.     ED Results / Procedures / Treatments   Labs (all labs ordered are listed, but only abnormal results are displayed) Labs Reviewed  COMPREHENSIVE METABOLIC PANEL - Abnormal; Notable for the following components:      Result Value   Potassium 3.4 (*)    Chloride 112 (*)    Calcium 8.1 (*)    All other components within normal limits  CBC - Abnormal; Notable for the following components:   RBC 5.14 (*)    RDW 15.7 (*)    All other components within normal limits  LIPASE, BLOOD  URINALYSIS, ROUTINE W REFLEX MICROSCOPIC    EKG None  Radiology No results found.  Procedures Procedures   Medications Ordered in ED Medications  ondansetron (ZOFRAN-ODT) disintegrating tablet 4 mg (4 mg Oral Given 01/21/22 1028)    ED Course/  Medical Decision Making/ A&P                           Medical Decision Making Amount and/or Complexity of Data Reviewed Labs: ordered.  Risk Prescription drug management.   76 year old female presents to ED for evaluation.  Please see HPI for further details.  On exam the patient is afebrile and nontachycardic.  Patient lung sounds are clear bilaterally, she is not hypoxic.  Patient abdomen soft and compressible, no tenderness elicited on palpation.  Patient nontoxic in appearance.  Patient workup initiated by me will include CBC, CMP, urinalysis, lipase.  Patient will be given initial dose of 4 mg Zofran.  Patient CBC unremarkable, no leukocytosis  or anemia.  Patient CMP shows decreased potassium of 3.4 however otherwise unremarkable with normal anion gap.  Patient lipase unremarkable.  Patient urinalysis still pending, per laboratory the urinalysis machine is having issues currently.  I reengaged with the patient and advised her that urinalysis would take longer than usual to result.  The patient is requesting discharge.  I advised the patient that we will discharge her and if her urine sample comes back appearing infected I will call her and send in medication.  I advised the patient that if I do not call her back, that her urinalysis did not appear infected.  The patient is amenable to this plan.  The patient will be discharged home with Zofran and advised to eat low-fat high-protein diet.  Patient advised to return to ED with any new fevers, abdominal pain or continued diarrhea.  Final Clinical Impression(s) / ED Diagnoses Final diagnoses:  Nausea  Diarrhea, unspecified type    Rx / DC Orders ED Discharge Orders          Ordered    ondansetron (ZOFRAN) 4 MG tablet  Every 6 hours PRN        01/21/22 1426              Lawana Chambers 01/21/22 1430    Davonna Belling, MD 01/21/22 1553

## 2022-01-21 NOTE — ED Triage Notes (Signed)
Patient c/o having mild abdominal pain, multiple diarrheal stools, and vomiting since last night.

## 2022-01-21 NOTE — ED Provider Triage Note (Signed)
Emergency Medicine Provider Triage Evaluation Note  Kristina Ayala , a 77 y.o. female  was evaluated in triage.  Pt complains of abdominal cramping, nausea without vomiting, diarrhea.  Patient reports symptoms began last night.  Patient states that she had just eaten fish.  Patient reports that she typically eats fish.  Patient states that over the course of the night she had multiple instances of diarrhea along with nausea without vomiting.  Patient states she also abdominal cramping only when she felt nauseous.  Patient denies any fevers, dysuria, flank pain.  Patient denies any blood in stool.  Patient denies history of abdominal surgeries or bowel obstructions.  Patient states she is still passing gas.  Review of Systems  Positive:  Negative:   Physical Exam  BP 131/83 (BP Location: Left Arm)   Pulse (!) 104   Temp 98.3 F (36.8 C) (Oral)   Resp 16   Ht '5\' 5"'$  (1.651 m)   Wt 76.2 kg   SpO2 96%   BMI 27.96 kg/m  Gen:   Awake, no distress   Resp:  Normal effort  MSK:   Moves extremities without difficulty  Other:    Medical Decision Making  Medically screening exam initiated at 10:44 AM.  Appropriate orders placed.  Kristina Ayala was informed that the remainder of the evaluation will be completed by another provider, this initial triage assessment does not replace that evaluation, and the importance of remaining in the ED until their evaluation is complete.     Kristina Cecil, PA-C 01/21/22 1045

## 2022-01-26 ENCOUNTER — Ambulatory Visit (HOSPITAL_COMMUNITY)
Admission: EM | Admit: 2022-01-26 | Discharge: 2022-01-26 | Disposition: A | Discharge status: Home / Self Care | Payer: Medicare Other | Attending: Internal Medicine | Admitting: Internal Medicine

## 2022-01-26 ENCOUNTER — Encounter (HOSPITAL_COMMUNITY): Payer: Self-pay

## 2022-01-26 ENCOUNTER — Encounter: Payer: Self-pay | Admitting: Internal Medicine

## 2022-01-26 DIAGNOSIS — K529 Noninfective gastroenteritis and colitis, unspecified: Secondary | ICD-10-CM

## 2022-01-26 MED ORDER — LACTINEX PO CHEW: 1.0000 | CHEWABLE_TABLET | Freq: Three times a day (TID) | ORAL | 0 refills | Status: DC

## 2022-01-26 NOTE — ED Provider Notes (Signed)
[place]    CSN: [id number] Arrival date & time: 01/26/22  1142      History   Chief Complaint Chief Complaint  Patient presents with   Diarrhea    HPI Kristina Ayala is a 77 y.o. female comes to the urgent care with 1 week history of diarrhea.  Diarrhea started on 12/28.  Patient was seen in the emergency department and advised to increase oral fluid intake.  Supportive care was recommended.  Diarrhea was secondary to a viral illness.  Patient's symptoms got better with hydration and the diarrhea subsided.  This morning patient has had 3 bowel movements some of which were watery.  She denies any abdominal pain or bloating.  No nausea or vomiting.  No fever or chills.  She is concerned that the diarrhea may be starting again hence the visit to the urgent care.  No recent antibiotic use.  No dizziness, near syncope or syncopal episodes.   HPI  Past Medical History:  Diagnosis Date   Arthritis    Bursitis    left elbow   Clostridium difficile infection    Diabetes mellitus without complication ([place])    Frequent falls    HLD (hyperlipidemia)    Hypertension    Infectious colitis     Patient Active Problem List   Diagnosis Date Noted   Foot injury, left, initial encounter 09/16/2021   Tingling 03/27/2021   Fatigue 03/27/2021   Itchy scalp 12/09/2020   Excessive daytime sleepiness 11/03/2020   Coronary artery calcification seen on CAT scan 09/09/2020   Aortic atherosclerosis ([place]) 08/31/2020   Right rib fracture 07/10/2020   Left ankle injury 07/10/2020   Closed low lateral malleolus fracture 07/10/2020   Snoring 05/23/2020   Cough 12/04/2019   Pancreatic mass 11/08/2019   DOE (dyspnea on exertion) 03/20/2019   Chronic left SI joint pain 03/13/2019   Leg cramping 01/10/2019   Osteoarthritis 01/10/2019   Right lower quadrant abdominal pain 11/09/2018   Adnexal mass, right 11/09/2018   Lung nodules - stable - no f/u needed 06/19/2018   Thyromegaly  10/28/2017   Restless leg syndrome 10/12/2017   Greater trochanteric bursitis of left hip 07/26/2017   Hypokalemia 04/18/2017   Degenerative arthritis of left knee 12/01/2016   Anxiety 12/01/2016   Diabetes mellitus without complication ([place]) 40/08/6759   Bursitis of right shoulder 09/04/2015   Lateral meniscus derangement 06/03/2015   Tear of LCL (lateral collateral ligament) of knee 06/03/2015   Lumbar radiculopathy 09/19/2014   Low back pain 01/04/2014   Bilateral knee pain 01/04/2014   Diverticulosis of colon without hemorrhage 07/26/2013   Trigger thumb of left hand 03/06/2013   Essential hypertension, benign 02/15/2013   Hyperlipidemia 09/28/2012   COPD (chronic obstructive pulmonary disease) ([place]) 05/22/2012   Osteoporosis 05/22/2012    Past Surgical History:  Procedure Laterality Date   CATARACT EXTRACTION Bilateral    COLONOSCOPY     ORIF PROXIMAL TIBIAL PLATEAU FRACTURE Left 02/19/2012   knee   POLYPECTOMY     TONSILLECTOMY      OB History   No obstetric history on file.      Home Medications    Prior to Admission medications   Medication Sig Start Date End Date Taking? Authorizing Provider  lactobacillus acidophilus & bulgar (LACTINEX) chewable tablet Chew 1 tablet by mouth 3 (three) times daily with meals. 01/26/22  Yes Keia Rask, Myrene Galas, MD  ALPRAZolam Duanne Moron) 0.5 MG tablet TAKE 1 TABLET(0.5 MG) BY MOUTH AT BEDTIME  AS NEEDED FOR ANXIETY 01/20/21   Janith Lima, MD  amLODipine (NORVASC) 10 MG tablet     [provider]  atorvastatin (LIPITOR) 40 MG tablet TAKE 1 TABLET(40 MG) BY MOUTH DAILY. FOLLOW-UP APPOINTMENT WITH LABS ARE DUE 07/17/21   [name], MD  Camphor-Eucalyptus-Menthol [facility] VAPORUB EX) Apply 1 application topically as needed (congestion).    [provider]  cholecalciferol (VITAMIN D) 25 MCG (1000 UNIT) tablet Take 2,000 Units by mouth daily.    [provider]  Cyanocobalamin (VITAMIN B-12) 2500 MCG SUBL  Place 2,500 mcg under the tongue daily.    [provider]  denosumab (PROLIA) 60 MG/ML SOSY injection INJECT 60 MG INTO SKIN AS DIRECTED 09/29/21   [name], MD  fluticasone (FLONASE) 50 MCG/ACT nasal spray Place 2 sprays into both nostrils daily. 10/07/20   [name], MD  gabapentin (NEURONTIN) 100 MG capsule Take 2 capsules (200 mg total) by mouth at bedtime. 10/28/21   Lyndal Pulley, DO  hydrochlorothiazide (HYDRODIURIL) 25 MG tablet TAKE 1 TABLET(25 MG) BY MOUTH EVERY MORNING 03/09/21   Burns, Claudina Lick, MD  ipratropium (ATROVENT) 0.06 % nasal spray Place 2 sprays into both nostrils 4 (four) times daily. As needed for nasal congestion, runny nose 04/13/21   Lynden Oxford Scales, PA-C  lisinopril (ZESTRIL) 40 MG tablet TAKE 1 TABLET BY MOUTH DAILY 06/25/21   [name], MD  ondansetron (ZOFRAN) 4 MG tablet Take 1 tablet (4 mg total) by mouth every 6 (six) hours as needed for nausea or vomiting. 01/21/22   Azucena Cecil, PA-C  potassium chloride (KLOR-CON) 10 MEQ tablet Take 10 mEq by mouth daily. 12/30/21   [provider]  TURMERIC PO Take 1 tablet by mouth daily.    [provider]    Family History Family History  Problem Relation Age of Onset   Diverticulosis Mother    Ovarian cancer Mother    Pancreatic cancer Father    Lung cancer Father    Colon polyps Sister    Colon cancer Neg Hx    Breast cancer Neg Hx     Social History Social History   Tobacco Use   Smoking status: Former    Packs/day: 0.25    Years: 50.00    Total pack years: 12.50    Types: Cigarettes    Quit date: 08/28/2014    Years since quitting: 7.4   Smokeless tobacco: Never   Tobacco comments:    1 pack every 3 days  Vaping Use   Vaping Use: Never used  Substance Use Topics   Alcohol use: No    Alcohol/week: 0.0 standard drinks of alcohol   Drug use: No     Allergies   Doxycycline, Minocycline, Wellbutrin [bupropion], Penicillins, and Z-pak  [azithromycin]   Review of Systems Review of Systems As per HPI  Physical Exam Triage Vital Signs ED Triage Vitals  Enc Vitals Group     BP 01/26/22 1353 (!) 154/88     Pulse Rate 01/26/22 1353 85     Resp 01/26/22 1353 18     Temp 01/26/22 1353 98.2 F (36.8 C)     Temp src --      SpO2 01/26/22 1353 100 %     Weight --      Height --      Head Circumference --      Peak Flow --      Pain Score 01/26/22 1352 0  Pain Loc --      Pain Edu? --      Excl. in [place]? --    No data found.  Updated Vital Signs BP (!) 154/88   Pulse 85   Temp 98.2 F (36.8 C)   Resp 18   SpO2 100%   Visual Acuity Right Eye Distance:   Left Eye Distance:   Bilateral Distance:    Right Eye Near:   Left Eye Near:    Bilateral Near:     Physical Exam Vitals and nursing note reviewed.  Constitutional:      General: She is not in acute distress.    Appearance: She is not ill-appearing.  HENT:     Right Ear: Tympanic membrane normal.     Left Ear: Tympanic membrane normal.  Cardiovascular:     Rate and Rhythm: Normal rate and regular rhythm.     Pulses: Normal pulses.     Heart sounds: Normal heart sounds.  Pulmonary:     Effort: Pulmonary effort is normal.     Breath sounds: Normal breath sounds.  Abdominal:     General: Bowel sounds are normal.     Palpations: Abdomen is soft.  Musculoskeletal:        General: Normal range of motion.  Neurological:     Mental Status: She is alert.      UC Treatments / Results  Labs (all labs ordered are listed, but only abnormal results are displayed) Labs Reviewed - No data to display  EKG   Radiology No results found.  Procedures Procedures (including critical care time)  Medications Ordered in UC Medications - No data to display  Initial Impression / Assessment and Plan / UC Course  I have reviewed the triage vital signs and the nursing notes.  Pertinent labs & imaging results that were available during my care of the  patient were reviewed by me and considered in my medical decision making (see chart for details).     1.  Gastroenteritis: Probiotic used 2-3 times daily Continue oral fluid intake Advance diet No testing indicated at this time. Return to urgent care if symptoms persist. Final Clinical Impressions(s) / UC Diagnoses   Final diagnoses:  Gastroenteritis     Discharge Instructions      Please increase oral fluid intake Take probiotics 2-3 times a day Avoid fruit juices Continue electrolyte balanced oral fluid intake Return to urgent care if symptoms worsen. Stool studies are not indicated at this time.   ED Prescriptions     Medication Sig Dispense Auth. Provider   lactobacillus acidophilus & bulgar (LACTINEX) chewable tablet Chew 1 tablet by mouth 3 (three) times daily with meals. 21 tablet Jacaria Colburn, Myrene Galas, MD      PDMP not reviewed this encounter.   Chase Picket, MD 01/26/22 [phone]

## 2022-01-26 NOTE — Discharge Instructions (Signed)
Please increase oral fluid intake Take probiotics 2-3 times a day Avoid fruit juices Continue electrolyte balanced oral fluid intake Return to urgent care if symptoms worsen. Stool studies are not indicated at this time.

## 2022-01-26 NOTE — ED Triage Notes (Signed)
Pt presents with complaints of diarrhea since 12/28. Reports she went to the ER and was diagnosed with a viral infection. Reports she has been doing their recommendations and symptoms continue. Pt reports feeling generalized weakness now. Pt has had 4 bowel movements today. Reports symptoms got better for a couple days and worsened again this morning.

## 2022-01-26 NOTE — Progress Notes (Deleted)
Subjective:    Patient ID: Kristina Ayala, female    DOB: [birth year], 77 y.o.   MRN: [id number]     HPI Kristina Ayala is here for follow up from the ED.  12/28 - ED for abd crmaping, N/V and D.  Started the night prior.  She had just eaten fish.  Had multiple diarrhea, nausea all night and abdominal cramping only when she felt nauseous.  No fever, urine symptoms.  Cbc, cmp, lipase, UA neg.  D/c'd home with zofran.  1/2 - urgent care for diarrhea - dx gastroenteritis.  That morning had 3 watery BM's.  No abd pain, no n/v, no fevers. No recent abx.  Advised to start probiotic, increased fluid intake.      Medications and allergies reviewed with patient and updated if appropriate.  Current Outpatient Medications on File Prior to Visit  Medication Sig Dispense Refill   ALPRAZolam (XANAX) 0.5 MG tablet TAKE 1 TABLET(0.5 MG) BY MOUTH AT BEDTIME AS NEEDED FOR ANXIETY 30 tablet 0   amLODipine (NORVASC) 10 MG tablet      atorvastatin (LIPITOR) 40 MG tablet TAKE 1 TABLET(40 MG) BY MOUTH DAILY. FOLLOW-UP APPOINTMENT WITH LABS ARE DUE 30 tablet 2   Camphor-Eucalyptus-Menthol (VICKS VAPORUB EX) Apply 1 application topically as needed (congestion).     cholecalciferol (VITAMIN D) 25 MCG (1000 UNIT) tablet Take 2,000 Units by mouth daily.     Cyanocobalamin (VITAMIN B-12) 2500 MCG SUBL Place 2,500 mcg under the tongue daily.     denosumab (PROLIA) 60 MG/ML SOSY injection INJECT 60 MG INTO SKIN AS DIRECTED 1 mL 0   fluticasone (FLONASE) 50 MCG/ACT nasal spray Place 2 sprays into both nostrils daily. 16 g 6   gabapentin (NEURONTIN) 100 MG capsule Take 2 capsules (200 mg total) by mouth at bedtime. 180 capsule 0   hydrochlorothiazide (HYDRODIURIL) 25 MG tablet TAKE 1 TABLET(25 MG) BY MOUTH EVERY MORNING 90 tablet 3   ipratropium (ATROVENT) 0.06 % nasal spray Place 2 sprays into both nostrils 4 (four) times daily. As needed for nasal congestion, runny nose 15 mL 0   lactobacillus acidophilus &  bulgar (LACTINEX) chewable tablet Chew 1 tablet by mouth 3 (three) times daily with meals. 21 tablet 0   lisinopril (ZESTRIL) 40 MG tablet TAKE 1 TABLET BY MOUTH DAILY 100 tablet 2   ondansetron (ZOFRAN) 4 MG tablet Take 1 tablet (4 mg total) by mouth every 6 (six) hours as needed for nausea or vomiting. 15 tablet 0   potassium chloride (KLOR-CON) 10 MEQ tablet Take 10 mEq by mouth daily.     TURMERIC PO Take 1 tablet by mouth daily.     No current facility-administered medications on file prior to visit.     Review of Systems     Objective:  There were no vitals filed for this visit. BP Readings from Last 3 Encounters:  01/26/22 (!) 154/88  01/21/22 124/87  01/05/22 128/80   Wt Readings from Last 3 Encounters:  01/21/22 168 lb (76.2 kg)  01/05/22 167 lb (75.8 kg)  11/18/21 166 lb (75.3 kg)   There is no height or weight on file to calculate BMI.    Physical Exam     Lab Results  Component Value Date   WBC 7.3 01/21/2022   HGB 13.5 01/21/2022   HCT 43.6 01/21/2022   PLT 188 01/21/2022   GLUCOSE 94 01/21/2022   CHOL 163 01/05/2022   TRIG 89.0 01/05/2022   HDL 59.30  01/05/2022   LDLDIRECT 213.1 05/22/2012   LDLCALC 86 01/05/2022   ALT 33 01/21/2022   AST 28 01/21/2022   NA 142 01/21/2022   K 3.4 (L) 01/21/2022   CL 112 (H) 01/21/2022   CREATININE 0.57 01/21/2022   BUN 10 01/21/2022   CO2 23 01/21/2022   TSH 0.64 01/05/2022   HGBA1C 6.4 01/05/2022   MICROALBUR <0.7 01/05/2022     Assessment & Plan:    See Problem List for Assessment and Plan of chronic medical problems.

## 2022-01-27 ENCOUNTER — Inpatient Hospital Stay: Payer: Medicare Other | Admitting: Internal Medicine

## 2022-01-27 DIAGNOSIS — K529 Noninfective gastroenteritis and colitis, unspecified: Secondary | ICD-10-CM

## 2022-01-28 ENCOUNTER — Other Ambulatory Visit: Payer: Self-pay

## 2022-01-28 ENCOUNTER — Encounter (HOSPITAL_COMMUNITY): Payer: Self-pay

## 2022-01-28 ENCOUNTER — Emergency Department (HOSPITAL_COMMUNITY)
Admission: EM | Admit: 2022-01-28 | Discharge: 2022-01-28 | Disposition: A | Discharge status: Home / Self Care | Payer: Medicare Other | Attending: Emergency Medicine | Admitting: Emergency Medicine

## 2022-01-28 DIAGNOSIS — Z79899 Other long term (current) drug therapy: Secondary | ICD-10-CM | POA: Diagnosis not present

## 2022-01-28 DIAGNOSIS — D72829 Elevated white blood cell count, unspecified: Secondary | ICD-10-CM | POA: Insufficient documentation

## 2022-01-28 DIAGNOSIS — R197 Diarrhea, unspecified: Secondary | ICD-10-CM | POA: Diagnosis present

## 2022-01-28 DIAGNOSIS — Z1152 Encounter for screening for COVID-19: Secondary | ICD-10-CM | POA: Insufficient documentation

## 2022-01-28 DIAGNOSIS — I10 Essential (primary) hypertension: Secondary | ICD-10-CM | POA: Insufficient documentation

## 2022-01-28 DIAGNOSIS — E119 Type 2 diabetes mellitus without complications: Secondary | ICD-10-CM | POA: Insufficient documentation

## 2022-01-28 DIAGNOSIS — R748 Abnormal levels of other serum enzymes: Secondary | ICD-10-CM | POA: Diagnosis not present

## 2022-01-28 LAB — COMPREHENSIVE METABOLIC PANEL
ALT: 45 U/L — ABNORMAL HIGH (ref 0–44)
AST: 28 U/L (ref 15–41)
Albumin: 4 g/dL (ref 3.5–5.0)
Alkaline Phosphatase: 42 U/L (ref 38–126)
Anion gap: 9 (ref 5–15)
BUN: 11 mg/dL (ref 8–23)
CO2: 29 mmol/L (ref 22–32)
Calcium: 9.4 mg/dL (ref 8.9–10.3)
Chloride: 101 mmol/L (ref 98–111)
Creatinine, Ser: 0.72 mg/dL (ref 0.44–1.00)
GFR, Estimated: >60 mL/min (ref >60)
Glucose, Bld: 97 mg/dL (ref 70–99)
Potassium: 3.8 mmol/L (ref 3.5–5.1)
Sodium: 139 mmol/L (ref 135–145)
Total Bilirubin: 0.6 mg/dL (ref 0.3–1.2)
Total Protein: 7 g/dL (ref 6.5–8.1)

## 2022-01-28 LAB — CBC
HCT: 41.6 % (ref 36.0–46.0)
Hemoglobin: 13.6 g/dL (ref 12.0–15.0)
MCH: 27.1 pg (ref 26.0–34.0)
MCHC: 32.7 g/dL (ref 30.0–36.0)
MCV: 82.9 fL (ref 80.0–100.0)
Platelets: 315 10*3/uL (ref 150–400)
RBC: 5.02 MIL/uL (ref 3.87–5.11)
RDW: 15.3 % (ref 11.5–15.5)
WBC: 6.2 10*3/uL (ref 4.0–10.5)
nRBC: 0 % (ref 0.0–0.2)

## 2022-01-28 LAB — URINALYSIS, ROUTINE W REFLEX MICROSCOPIC
Bilirubin Urine: NEGATIVE
Glucose, UA: NEGATIVE mg/dL
Ketones, ur: NEGATIVE mg/dL
Nitrite: NEGATIVE
Protein, ur: NEGATIVE mg/dL
Specific Gravity, Urine: 1.009 (ref 1.005–1.030)
pH: 5 (ref 5.0–8.0)

## 2022-01-28 LAB — RESP PANEL BY RT-PCR (RSV, FLU A&B, COVID)  RVPGX2
Influenza A by PCR: NEGATIVE
Influenza B by PCR: NEGATIVE
Resp Syncytial Virus by PCR: NEGATIVE
SARS Coronavirus 2 by RT PCR: NEGATIVE

## 2022-01-28 LAB — LIPASE, BLOOD: Lipase: 56 U/L — ABNORMAL HIGH (ref 11–51)

## 2022-01-28 MED ORDER — SODIUM CHLORIDE 0.9 % IV BOLUS
1000.0000 mL | Freq: Once | INTRAVENOUS | Status: AC
  Administered 2022-01-28: 1000 mL via INTRAVENOUS

## 2022-01-28 NOTE — ED Triage Notes (Signed)
Pt c/o [id number]. Pt states has gone 4 times in the last 24 hrs. Pt c/o weakness that started today. Pt denies N/V, abd pain. Pt states was seen as UC recently for same and told she needed to take a probiotic. Pt states she has started taking a OTC probiotic for the last 3 days.

## 2022-01-28 NOTE — ED Provider Notes (Signed)
[facility] EMERGENCY DEPARTMENT Provider Note   CSN: [id number] Arrival date & time: 01/28/22  1430     History  Chief Complaint  Patient presents with   Diarrhea    Kristina Ayala is a 77 y.o. female.  The history is provided by the patient and medical records. No language interpreter was used.  Diarrhea    77 year old female with significant history of infectious colitis, C. difficile, diabetes, hypertension, HLD presenting complaining of diarrhea.  Patient report for more than a week she has had recurrent diarrhea.  When she first developed loose stools he went to [facility], ER to be evaluated.  She was recommended to change her diet and stay hydrated.  She did follow instruction which did provide some relief for a few days and returns.  She then went to urgent care center for evaluation and was told that it could be related to viral illness and she was recommended to take probiotic.  She did take probiotic with some improvement of her symptoms however since yesterday she has had several episodes of loose stools.  She was at work today but was told by her job to go to the ER to be evaluated due to her recurrent diarrhea.  She does not endorse any fever or chills no runny nose sneezing or coughing no sore throat no chest pain or shortness of breath no abdominal pain no blood in the stool and no recent antibiotic use.  She denies any other environmental changes.  She voiced frustration of her recurrent diarrhea.  Last episode was earlier today.  Home Medications Prior to Admission medications   Medication Sig Start Date End Date Taking? Authorizing Provider  ALPRAZolam (XANAX) 0.5 MG tablet TAKE 1 TABLET(0.5 MG) BY MOUTH AT BEDTIME AS NEEDED FOR ANXIETY 01/20/21   Janith Lima, MD  amLODipine (NORVASC) 10 MG tablet     [provider]  atorvastatin (LIPITOR) 40 MG tablet TAKE 1 TABLET(40 MG) BY MOUTH DAILY. FOLLOW-UP APPOINTMENT WITH LABS ARE DUE  07/17/21   [name], MD  Camphor-Eucalyptus-Menthol [facility] VAPORUB EX) Apply 1 application topically as needed (congestion).    [provider]  cholecalciferol (VITAMIN D) 25 MCG (1000 UNIT) tablet Take 2,000 Units by mouth daily.    [provider]  Cyanocobalamin (VITAMIN B-12) 2500 MCG SUBL Place 2,500 mcg under the tongue daily.    [provider]  denosumab (PROLIA) 60 MG/ML SOSY injection INJECT 60 MG INTO SKIN AS DIRECTED 09/29/21   [name], MD  fluticasone (FLONASE) 50 MCG/ACT nasal spray Place 2 sprays into both nostrils daily. 10/07/20   [name], MD  gabapentin (NEURONTIN) 100 MG capsule Take 2 capsules (200 mg total) by mouth at bedtime. 10/28/21   Lyndal Pulley, DO  hydrochlorothiazide (HYDRODIURIL) 25 MG tablet TAKE 1 TABLET(25 MG) BY MOUTH EVERY MORNING 03/09/21   Burns, Claudina Lick, MD  ipratropium (ATROVENT) 0.06 % nasal spray Place 2 sprays into both nostrils 4 (four) times daily. As needed for nasal congestion, runny nose 04/13/21   Lynden Oxford Scales, PA-C  lactobacillus acidophilus & bulgar (LACTINEX) chewable tablet Chew 1 tablet by mouth 3 (three) times daily with meals. 01/26/22   Lamptey, Myrene Galas, MD  lisinopril (ZESTRIL) 40 MG tablet TAKE 1 TABLET BY MOUTH DAILY 06/25/21   [name], MD  ondansetron (ZOFRAN) 4 MG tablet Take 1 tablet (4 mg total) by mouth every 6 (six) hours as needed for nausea or  vomiting. 01/21/22   Azucena Cecil, PA-C  potassium chloride (KLOR-CON) 10 MEQ tablet Take 10 mEq by mouth daily. 12/30/21   [provider]  TURMERIC PO Take 1 tablet by mouth daily.    [provider]      Allergies    Doxycycline, Minocycline, Wellbutrin [bupropion], Penicillins, and Z-pak [azithromycin]    Review of Systems   Review of Systems  Gastrointestinal:  Positive for diarrhea.  All other systems reviewed and are negative.   Physical Exam Updated Vital Signs BP (!) 152/80   Pulse 85    Temp 98.4 F (36.9 C) (Oral)   Resp 15   Ht '5\' 5"'$  (1.651 m)   Wt 76.2 kg   SpO2 100%   BMI 27.96 kg/m  Physical Exam Vitals and nursing note reviewed.  Constitutional:      General: She is not in acute distress.    Appearance: She is well-developed.  HENT:     Head: Atraumatic.  Eyes:     Conjunctiva/sclera: Conjunctivae normal.  Cardiovascular:     Rate and Rhythm: Normal rate and regular rhythm.     Pulses: Normal pulses.     Heart sounds: Normal heart sounds.  Pulmonary:     Effort: Pulmonary effort is normal.     Breath sounds: No wheezing, rhonchi or rales.  Abdominal:     Palpations: Abdomen is soft.     Tenderness: There is no abdominal tenderness.  Musculoskeletal:     Cervical back: Neck supple.  Skin:    Findings: No rash.  Neurological:     Mental Status: She is alert and oriented to person, place, and time.  Psychiatric:        Mood and Affect: Mood normal.     ED Results / Procedures / Treatments   Labs (all labs ordered are listed, but only abnormal results are displayed) Labs Reviewed  LIPASE, BLOOD - Abnormal; Notable for the following components:      Result Value   Lipase 56 (*)    All other components within normal limits  COMPREHENSIVE METABOLIC PANEL - Abnormal; Notable for the following components:   ALT 45 (*)    All other components within normal limits  URINALYSIS, ROUTINE W REFLEX MICROSCOPIC - Abnormal; Notable for the following components:   Color, Urine STRAW (*)    APPearance HAZY (*)    Hgb urine dipstick SMALL (*)    Leukocytes,Ua MODERATE (*)    Bacteria, UA RARE (*)    All other components within normal limits  RESP PANEL BY RT-PCR (RSV, FLU A&B, COVID)  RVPGX2  C DIFFICILE QUICK SCREEN W PCR REFLEX    CBC    EKG None  Radiology No results found.  Procedures Procedures    Medications Ordered in ED Medications  sodium chloride 0.9 % bolus 1,000 mL (0 mLs Intravenous Stopped 01/28/22 2230)    ED Course/ Medical  Decision Making/ A&P                           Medical Decision Making Amount and/or Complexity of Data Reviewed Labs: ordered.   BP (!) 152/80   Pulse 85   Temp 98.4 F (36.9 C) (Oral)   Resp 15   Ht '5\' 5"'$  (1.651 m)   Wt 76.2 kg   SpO2 100%   BMI 27.96 kg/m   62:82 PM  77 year old female with significant history of infectious colitis, C. difficile, diabetes,  hypertension, HLD presenting complaining of diarrhea.  Patient report for more than a week she has had recurrent diarrhea.  When she first developed loose stools he went to [facility], ER to be evaluated.  She was recommended to change her diet and stay hydrated.  She did follow instruction which did provide some relief for a few days and returns.  She then went to urgent care center for evaluation and was told that it could be related to viral illness and she was recommended to take probiotic.  She did take probiotic with some improvement of her symptoms however since yesterday she has had several episodes of loose stools.  She was at work today but was told by her job to go to the ER to be evaluated due to her recurrent diarrhea.  She does not endorse any fever or chills no runny nose sneezing or coughing no sore throat no chest pain or shortness of breath no abdominal pain no blood in the stool and no recent antibiotic use.  She denies any other environmental changes.  She voiced frustration of her recurrent diarrhea.  Last episode was earlier today.  On exam this is a well-appearing elderly female laying in bed appears to be in no acute discomfort.  She does not appear to be dehydrated.  Heart and lung sounds normal abdomen soft nontender with bowel sounds present.  Vital signs remarkable for mildly elevated blood pressure 152/80.  She is afebrile and no hypoxia.  Labs obtained independently viewed interpreted by me and overall reassuring.  Normal WBC.  Minimally elevated lipase of 67 which is essentially normal.  Urinalysis shows  moderate leukocyte esterase and rare bacteria.  Doubt UTI as patient does not have any urinary symptoms.  EMR review, patient did have C. difficile 10 years ago.  She does not have any recent sickness and denies taking antibiotic however I will order a C. difficile panel if she produce any stool here.  Will give IV fluid and will monitor.  Consider CT scan of abdomen pelvis but have low suspicion for colitis or other acute abdominal pathology as patient does not have any reproducible abdominal pain.  11:12 PM -The patient was maintained on a cardiac monitor.  I personally viewed and interpreted the cardiac monitored which showed an underlying rhythm of: NSR -This patient presents to the ED for concern of diarrhea, this involves an extensive number of treatment options, and is a complaint that carries with it a high risk of complications and morbidity.  The differential diagnosis includes infectious diarrhea, colitis, diverticular disease, c.diff, gastroenteritis, food intolerance, functional  -Co morbidities that complicate the patient evaluation includes DM, HTN -Treatment includes IVF -Reevaluation of the patient after these medicines showed that the patient improved -PCP office notes or outside notes reviewed -Discussion with attending Dr. Johnney Killian -Escalation to admission/observation considered: patients feels much better, is comfortable with discharge, and will follow up with PCP -Prescription medication considered, patient comfortable with OTC -Social Determinant of Health considered          Final Clinical Impression(s) / ED Diagnoses Final diagnoses:  Diarrhea of presumed infectious origin    Rx / DC Orders ED Discharge Orders     None         Domenic Moras, PA-C 01/28/22 2315    Charlesetta Shanks, MD 02/03/22 1706

## 2022-01-28 NOTE — ED Notes (Signed)
PA at bedside.

## 2022-01-28 NOTE — ED Notes (Signed)
Pt unable to give stool sample at this time.

## 2022-01-28 NOTE — ED Notes (Signed)
Discharge instructions discussed with pt. Pt verbalized understanding with no questions at this time.  

## 2022-01-28 NOTE — ED Provider Triage Note (Signed)
Emergency Medicine Provider Triage Evaluation Note  Kristina Ayala , a 77 y.o. female  was evaluated in triage.  Pt complains of diarrhea onset 1 week.  Has had 4 episodes of soft stools that are intermittently watery non-bloody diarrhea within the last 24 hours.  Has associated generalized weakness.  Was evaluated urgent care and started on a probiotic.  Denies nausea, vomiting, abdominal pain, urinary symptoms.  Review of Systems  Positive:  Negative:   Physical Exam  BP (!) 181/87 (BP Location: Right Arm)   Pulse 87   Temp 98.9 F (37.2 C) (Oral)   Resp 16   Ht '5\' 5"'$  (1.651 m)   Wt 76.2 kg   SpO2 97%   BMI 27.96 kg/m  Gen:   Awake, no distress   Resp:  Normal effort  MSK:   Moves extremities without difficulty  Other:  No abdominal  TTP.   Medical Decision Making  Medically screening exam initiated at 4:23 PM.  Appropriate orders placed.  Kristina Ayala was informed that the remainder of the evaluation will be completed by another provider, this initial triage assessment does not replace that evaluation, and the importance of remaining in the ED until their evaluation is complete.    Alistar Mcenery A, PA-C 01/28/22 [phone]

## 2022-01-28 NOTE — ED Notes (Signed)
Pt is unable to give a stool sample. She denies having watery diarrhea at this time.

## 2022-01-29 ENCOUNTER — Telehealth: Payer: Self-pay

## 2022-01-29 NOTE — Telephone Encounter (Signed)
Transition Care Management Follow-up Telephone Call Date of discharge and from where: [facility] 01/28/2022 How have you been since you were released from the hospital? Patient reports feeling better today. Any questions or concerns? No  Items Reviewed: Did the pt receive and understand the discharge instructions provided? No  Medications obtained and verified? No new meds given Other? No  Any new allergies since your discharge? No  Dietary orders reviewed? Yes Do you have support at home? Yes   Home Care and Equipment/Supplies: Were home health services ordered? No If so, what is the name of the agency? N/A  Has the agency set up a time to come to the patient's home? not applicable Were any new equipment or medical supplies ordered?  No What is the name of the medical supply agency? N/A Were you able to get the supplies/equipment? not applicable Do you have any questions related to the use of the equipment or supplies? No  Functional Questionnaire: (I = Independent and D = Dependent) ADLs: I  Bathing/Dressing- I  Meal Prep- I  Eating- I  Maintaining continence- I  Transferring/Ambulation- I  Managing Meds- I  Follow up appointments reviewed:  PCP Hospital f/u appt confirmed? Yes  Scheduled to see Dr. Quay Burow on 02/04/22 @ 9:10 am. [facility] f/u appt confirmed? No  . Are transportation arrangements needed? No  If their condition worsens, is the pt aware to call PCP or go to the Emergency Dept.? Yes Was the patient provided with contact information for the PCP's office or ED? Yes Was to pt encouraged to call back with questions or concerns? Yes

## 2022-02-03 NOTE — Progress Notes (Unsigned)
Subjective:    Patient ID: Kristina Ayala, female    DOB: August 06, 1945, 77 y.o.   MRN: [id number]     HPI Kristina Ayala is here for follow up from the ED.  12/28 - ED for abd crmaping, N/V and D.  Started the night prior.  She had just eaten fish.  Had multiple diarrhea, nausea all night and abdominal cramping only when she felt nauseous.  No fever, urine symptoms.  Cbc, cmp, lipase, UA neg.  D/c'd home with zofran.  1/2 - urgent care for diarrhea - dx gastroenteritis.  That morning had 3 watery BM's.  No abd pain, no n/v, no fevers. No recent abx.  Advised to start probiotic, increased fluid intake.   1/4 - ED - diarrhea.  No other symptoms - no cold symptoms, no abd pain. No abd pain on exam. Lipase was slightly elevated, cbc, cmp unremarkable.  UA with mod leuks, rare bacteria.  Given IVF.     Diarrhea is not persistent - she has had days w/o diarrhea.  She has taken imodium /pepto a couple of times initially and it did not work.  She tried pepto one other time and it worked.   The past several days her stools are solid.  She has not had diarrhea.  She denies abdominal pain or fever.  Medications and allergies reviewed with patient and updated if appropriate.  Current Outpatient Medications on File Prior to Visit  Medication Sig Dispense Refill   ALPRAZolam (XANAX) 0.5 MG tablet TAKE 1 TABLET(0.5 MG) BY MOUTH AT BEDTIME AS NEEDED FOR ANXIETY 30 tablet 0   amLODipine (NORVASC) 10 MG tablet      atorvastatin (LIPITOR) 40 MG tablet TAKE 1 TABLET(40 MG) BY MOUTH DAILY. FOLLOW-UP APPOINTMENT WITH LABS ARE DUE 30 tablet 2   Camphor-Eucalyptus-Menthol (VICKS VAPORUB EX) Apply 1 application topically as needed (congestion).     cholecalciferol (VITAMIN D) 25 MCG (1000 UNIT) tablet Take 2,000 Units by mouth daily.     Cyanocobalamin (VITAMIN B-12) 2500 MCG SUBL Place 2,500 mcg under the tongue daily.     denosumab (PROLIA) 60 MG/ML SOSY injection INJECT 60 MG INTO SKIN AS DIRECTED 1 mL  0   fluticasone (FLONASE) 50 MCG/ACT nasal spray Place 2 sprays into both nostrils daily. 16 g 6   gabapentin (NEURONTIN) 100 MG capsule Take 2 capsules (200 mg total) by mouth at bedtime. 180 capsule 0   hydrochlorothiazide (HYDRODIURIL) 25 MG tablet TAKE 1 TABLET(25 MG) BY MOUTH EVERY MORNING 90 tablet 3   ipratropium (ATROVENT) 0.06 % nasal spray Place 2 sprays into both nostrils 4 (four) times daily. As needed for nasal congestion, runny nose 15 mL 0   lactobacillus acidophilus & bulgar (LACTINEX) chewable tablet Chew 1 tablet by mouth 3 (three) times daily with meals. 21 tablet 0   lisinopril (ZESTRIL) 40 MG tablet TAKE 1 TABLET BY MOUTH DAILY 100 tablet 2   ondansetron (ZOFRAN) 4 MG tablet Take 1 tablet (4 mg total) by mouth every 6 (six) hours as needed for nausea or vomiting. 15 tablet 0   potassium chloride (KLOR-CON) 10 MEQ tablet Take 10 mEq by mouth daily.     TURMERIC PO Take 1 tablet by mouth daily.     No current facility-administered medications on file prior to visit.     Review of Systems  Constitutional:  Negative for appetite change, chills and fever.  Gastrointestinal:  Negative for abdominal pain, blood in stool, constipation, diarrhea and  nausea.       No gerd  Musculoskeletal:  Negative for myalgias.  Neurological:  Negative for light-headedness and headaches.       Objective:   Vitals:   02/04/22 0919  BP: 130/80  Pulse: 89  Temp: 97.9 F (36.6 C)  SpO2: 97%   BP Readings from Last 3 Encounters:  02/04/22 130/80  01/28/22 131/74  01/26/22 (!) 154/88   Wt Readings from Last 3 Encounters:  02/04/22 162 lb (73.5 kg)  01/28/22 167 lb 15.9 oz (76.2 kg)  01/21/22 168 lb (76.2 kg)   Body mass index is 26.96 kg/m.    Physical Exam Constitutional:      General: She is not in acute distress.    Appearance: Normal appearance. She is not ill-appearing.  HENT:     Head: Normocephalic and atraumatic.  Abdominal:     General: There is no distension.      Palpations: Abdomen is soft.     Tenderness: There is no abdominal tenderness. There is no guarding or rebound.  Musculoskeletal:     Right lower leg: No edema.     Left lower leg: No edema.  Skin:    General: Skin is warm and dry.  Neurological:     Mental Status: She is alert.        Lab Results  Component Value Date   WBC 6.2 01/28/2022   HGB 13.6 01/28/2022   HCT 41.6 01/28/2022   PLT 315 01/28/2022   GLUCOSE 97 01/28/2022   CHOL 163 01/05/2022   TRIG 89.0 01/05/2022   HDL 59.30 01/05/2022   LDLDIRECT 213.1 05/22/2012   LDLCALC 86 01/05/2022   ALT 45 (H) 01/28/2022   AST 28 01/28/2022   NA 139 01/28/2022   K 3.8 01/28/2022   CL 101 01/28/2022   CREATININE 0.72 01/28/2022   BUN 11 01/28/2022   CO2 29 01/28/2022   TSH 0.64 01/05/2022   HGBA1C 6.4 01/05/2022   MICROALBUR <0.7 01/05/2022     Assessment & Plan:    See Problem List for Assessment and Plan of chronic medical problems.

## 2022-02-04 ENCOUNTER — Encounter: Payer: Self-pay | Admitting: Internal Medicine

## 2022-02-04 ENCOUNTER — Ambulatory Visit
Admission: RE | Admit: 2022-02-04 | Discharge: 2022-02-04 | Disposition: A | Discharge status: Home / Self Care | Payer: Medicare Other | Source: Ambulatory Visit | Attending: Internal Medicine | Admitting: Internal Medicine

## 2022-02-04 ENCOUNTER — Ambulatory Visit (INDEPENDENT_AMBULATORY_CARE_PROVIDER_SITE_OTHER): Payer: Medicare Other | Admitting: Internal Medicine

## 2022-02-04 VITALS — BP 130/80 | HR 89 | Temp 97.9°F | Ht 65.0 in | Wt 162.0 lb

## 2022-02-04 DIAGNOSIS — I10 Essential (primary) hypertension: Secondary | ICD-10-CM

## 2022-02-04 DIAGNOSIS — R197 Diarrhea, unspecified: Secondary | ICD-10-CM | POA: Diagnosis not present

## 2022-02-04 DIAGNOSIS — K8689 Other specified diseases of pancreas: Secondary | ICD-10-CM

## 2022-02-04 MED ORDER — GADOPICLENOL 0.5 MMOL/ML IV SOLN
7.5000 mL | Freq: Once | INTRAVENOUS | Status: AC | PRN
  Administered 2022-02-04: 7.5 mL via INTRAVENOUS

## 2022-02-04 NOTE — Patient Instructions (Addendum)
? ? ? ?  ? ? ?  Medications changes include :  none  ? ? ? ? ? ?Return if symptoms worsen or fail to improve. ? ?

## 2022-02-04 NOTE — Assessment & Plan Note (Signed)
Chronic BP well controlled Continue amlodipine 10 mg daily, hct 25 mg daily, lisinopril 40 mg daily

## 2022-02-04 NOTE — Assessment & Plan Note (Signed)
Subacute Resolved Likely was viral gastroenteritis No longer having diarrhea and did not have any concerning symptoms Continue bland diet - advance slowly Continue increased fluids Call w questions

## 2022-02-08 ENCOUNTER — Telehealth: Payer: Self-pay | Admitting: Internal Medicine

## 2022-02-08 NOTE — Telephone Encounter (Signed)
Left message for patient to call back to schedule Medicare Annual Wellness Visit   Last AWV  02/20/21  Please schedule at anytime with [facility] if patient calls the office back.    30 Minutes appointment   Any questions, please call me at [phone]

## 2022-02-17 NOTE — Progress Notes (Unsigned)
Kristina Ayala [address] Phone: [phone] Subjective:   IVilma Ayala, am serving as a scribe for Dr. Hulan Saas.  I'm seeing this patient by the request  of:  [name], MD  CC: low back pain   IWP:[id number]  11/18/2021 Also significant improvement noted. Has been wearing more supportive shoes that have been helpful. Still avoid being barefoot for the next month. If pain does not completely dissipate in the next 2 to 3 months I would like to see her again.   Chronic problem with worsening symptoms.  Still wants to avoid any surgical intervention secondary to patient's comorbidities and social determinants of health to help her.  Discussed the duloxetine again which patient does not want to try.  We discussed the potential epidural which I was ordered and patient was noncompliant.  We will get an epidural done in the near     Update 02/18/2022 Kristina Ayala is a 77 y.o. female coming in with complaint of LBP Epidural 01/01/2022. Patient states would like injection in knee. Thinks she needs another epidural.   Reviewing patient's chart at the end of December early January patient was in the emergency department 3 times with GI difficulties.  Had resolved when patient did see primary care provider on January 11.  Patient also had an MRI in January 11 of this year showing the patient did have a right ovarian cyst noted that has been slowly progressing in size has been recommended to have an ultrasound in 6 to 12 months.  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  Past Medical History:  Diagnosis Date   Arthritis    Bursitis    left elbow   Clostridium difficile infection    Diabetes mellitus without complication (HCC)    Frequent falls    HLD (hyperlipidemia)    Hypertension    Infectious colitis    Past Surgical History:  Procedure Laterality Date    CATARACT EXTRACTION Bilateral    COLONOSCOPY     ORIF PROXIMAL TIBIAL PLATEAU FRACTURE Left 02/19/2012   knee   POLYPECTOMY     TONSILLECTOMY     Social History   Socioeconomic History   Marital status: Divorced    Spouse name: Not on file   Number of children: 2   Years of education: Not on file   Highest education level: Associate degree: academic program  Occupational History   Occupation: RETIRED  Tobacco Use   Smoking status: Former    Packs/day: 0.25    Years: 50.00    Total pack years: 12.50    Types: Cigarettes    Quit date: 08/28/2014    Years since quitting: 7.4   Smokeless tobacco: Never   Tobacco comments:    1 pack every 3 days  Vaping Use   Vaping Use: Never used  Substance and Sexual Activity   Alcohol use: No    Alcohol/week: 0.0 standard drinks of alcohol   Drug use: No   Sexual activity: Not Currently  Other Topics Concern   Not on file  Social History Narrative   Lives alone    Right handed   Caffeine: 3x weekly         Social Determinants of Health   Financial Resource Strain: Low Risk  (02/20/2021)   Overall Financial Resource Strain (CARDIA)    Difficulty of Paying Living Expenses: Not hard at all  Food Insecurity: No Food Insecurity (02/20/2021)   Hunger Vital Sign    Worried About Running Out of Food in the Last Year: Never true    Ran Out of Food in the Last Year: Never true  Transportation Needs: No Transportation Needs (02/20/2021)   PRAPARE - Hydrologist (Medical): No    Lack of Transportation (Non-Medical): No  Physical Activity: Inactive (02/20/2021)   Exercise Vital Sign    Days of Exercise per Week: 0 days    Minutes of Exercise per Session: 0 min  Stress: No Stress Concern Present (02/20/2021)   [place]    Feeling of Stress : Not at all  Social Connections: Moderately Integrated (02/20/2021)   Social Connection and Isolation Panel  [NHANES]    Frequency of Communication with Friends and Family: More than three times a week    Frequency of Social Gatherings with Friends and Family: More than three times a week    Attends Religious Services: More than 4 times per year    Active Member of [employer] or Organizations: Yes    Attends Archivist Meetings: More than 4 times per year    Marital Status: Widowed   Allergies  Allergen Reactions   Doxycycline Diarrhea   Minocycline Diarrhea   Wellbutrin [Bupropion] Swelling    Per pt her tongue was swollen and sore/symptoms stopped once the medication was discontinued.    Penicillins Rash    Has patient had a PCN reaction causing immediate rash, facial/tongue/throat swelling, SOB or lightheadedness with hypotension: no Has patient had a PCN reaction causing severe rash involving mucus membranes or skin necrosis: no Has patient had a PCN reaction that required hospitalization: unknown Has patient had a PCN reaction occurring within the last 10 years: no If all of the above answers are "NO", then may proceed with Cephalosporin use.    Z-Pak [Azithromycin] Other (See Comments)    diarrhea   Family History  Problem Relation Age of Onset   Diverticulosis Mother    Ovarian cancer Mother    Pancreatic cancer Father    Lung cancer Father    Colon polyps Sister    Colon cancer Neg Hx    Breast cancer Neg Hx     Current Outpatient Medications (Endocrine & Metabolic):    denosumab (PROLIA) 60 MG/ML SOSY injection, INJECT 60 MG INTO SKIN AS DIRECTED  Current Outpatient Medications (Cardiovascular):    amLODipine (NORVASC) 10 MG tablet,    atorvastatin (LIPITOR) 40 MG tablet, TAKE 1 TABLET(40 MG) BY MOUTH DAILY. FOLLOW-UP APPOINTMENT WITH LABS ARE DUE   hydrochlorothiazide (HYDRODIURIL) 25 MG tablet, TAKE 1 TABLET(25 MG) BY MOUTH EVERY MORNING   lisinopril (ZESTRIL) 40 MG tablet, TAKE 1 TABLET BY MOUTH DAILY  Current Outpatient Medications (Respiratory):     Camphor-Eucalyptus-Menthol (VICKS VAPORUB EX), Apply 1 application topically as needed (congestion).   fluticasone (FLONASE) 50 MCG/ACT nasal spray, Place 2 sprays into both nostrils daily.   ipratropium (ATROVENT) 0.06 % nasal spray, Place 2 sprays into both nostrils 4 (four) times daily. As needed for nasal congestion, runny nose   Current Outpatient Medications (Hematological):    Cyanocobalamin (VITAMIN B-12) 2500 MCG SUBL, Place 2,500 mcg under the tongue daily.  Current Outpatient Medications (Other):    ALPRAZolam (XANAX) 0.5 MG tablet, TAKE 1 TABLET(0.5 MG) BY MOUTH AT BEDTIME AS NEEDED FOR ANXIETY   cholecalciferol (VITAMIN D) 25 MCG (1000 UNIT) tablet, Take 2,000  Units by mouth daily.   gabapentin (NEURONTIN) 100 MG capsule, Take 2 capsules (200 mg total) by mouth at bedtime.   lactobacillus acidophilus & bulgar (LACTINEX) chewable tablet, Chew 1 tablet by mouth 3 (three) times daily with meals.   ondansetron (ZOFRAN) 4 MG tablet, Take 1 tablet (4 mg total) by mouth every 6 (six) hours as needed for nausea or vomiting.   potassium chloride (KLOR-CON) 10 MEQ tablet, Take 10 mEq by mouth daily.   TURMERIC PO, Take 1 tablet by mouth daily.   Reviewed prior external information including notes and imaging from  primary care provider As well as notes that were available from care everywhere and other healthcare systems.  Past medical history, social, surgical and family history all reviewed in electronic medical record.  No pertanent information unless stated regarding to the chief complaint.   Review of Systems:  No headache, visual changes, nausea, vomiting, diarrhea, constipation, dizziness, abdominal pain, skin rash, fevers, chills, night sweats, weight loss, swollen lymph nodes, body aches, joint swelling, chest pain, shortness of breath, mood changes. POSITIVE muscle aches  Objective  Blood pressure 116/82, pulse 100, height '5\' 5"'$  (1.651 m), weight 166 lb (75.3 kg), SpO2 97  %.   General: No apparent distress alert and oriented x3 mood and affect normal, dressed appropriately.  HEENT: Pupils equal, extraocular movements intact  Respiratory: Patient's speak in full sentences and does not appear short of breath  Cardiovascular: No lower extremity edema, non tender, no erythema  Low back exam does have some loss lordosis.  Some tenderness to palpation in the area.  Patient does have tightness noted with Neer and Hawkins.  Positive straight leg test on the left side. Patient's son knee does show arthritic changes noted.  No instability with valgus and varus force.  After informed written and verbal consent, patient was seated on exam table. Left knee was prepped with alcohol swab and utilizing anterolateral approach, patient's left knee space was injected with 4:1  marcaine 0.5%: Kenalog '40mg'$ /dL. Patient tolerated the procedure well without immediate complications.    Impression and Recommendations:    The above documentation has been reviewed and is accurate and complete Lyndal Pulley, DO

## 2022-02-18 ENCOUNTER — Ambulatory Visit (INDEPENDENT_AMBULATORY_CARE_PROVIDER_SITE_OTHER): Payer: 59 | Admitting: Family Medicine

## 2022-02-18 VITALS — BP 116/82 | HR 100 | Ht 65.0 in | Wt 166.0 lb

## 2022-02-18 DIAGNOSIS — M1732 Unilateral post-traumatic osteoarthritis, left knee: Secondary | ICD-10-CM | POA: Diagnosis not present

## 2022-02-18 DIAGNOSIS — M5416 Radiculopathy, lumbar region: Secondary | ICD-10-CM

## 2022-02-18 NOTE — Patient Instructions (Addendum)
Injection in left knee today  Imaging [phone] Call Today to schedule epidural See you again in 2-3 months

## 2022-02-18 NOTE — Assessment & Plan Note (Signed)
Patient given injection and tolerated the procedure well, discussed icing regimen and home exercises, increase activity slowly.  We discussed the possibility of viscosupplementation again.  Discussed also if worsening pain surgical intervention.  Follow-up with me again in 6 to 8 weeks

## 2022-02-18 NOTE — Assessment & Plan Note (Signed)
Worsening lumbar radiculopathy again.  Patient has responded well to epidurals previously.  Will order another epidural to see if this will be helpful.  Still wants to avoid surgical intervention.

## 2022-02-26 ENCOUNTER — Other Ambulatory Visit: Payer: Self-pay | Admitting: Internal Medicine

## 2022-02-26 ENCOUNTER — Ambulatory Visit: Payer: 59 | Admitting: Family Medicine

## 2022-02-26 DIAGNOSIS — I10 Essential (primary) hypertension: Secondary | ICD-10-CM

## 2022-03-02 ENCOUNTER — Telehealth: Payer: Self-pay

## 2022-03-02 NOTE — Telephone Encounter (Signed)
Prolia VOB initiated via [email or web address]  Last Prolia inj 10/01/21 Next Prolia inj due 04/02/22

## 2022-03-03 NOTE — Progress Notes (Signed)
In regards to inquiry about viral respiratory panel that was ordered.  Please note patient present with diarrhea: these are symptoms that can be associated with viral respiratory infection.

## 2022-03-08 ENCOUNTER — Other Ambulatory Visit (HOSPITAL_COMMUNITY): Payer: Self-pay

## 2022-03-08 NOTE — Telephone Encounter (Signed)
Appointment made for 04/02/22.

## 2022-03-08 NOTE — Telephone Encounter (Signed)
Pharmacy Patient Advocate Encounter  Insurance verification completed.    The patient is insured through [employer]   Ran test claims for: Prolia 58m.  Pharmacy benefit copay: $0.00

## 2022-03-08 NOTE — Telephone Encounter (Signed)
Pt ready for scheduling on or after 04/01/22  Out-of-pocket cost due at time of visit: $327 (medical) / $0.00 (pharmacy)  Primary: Medicare Prolia co-insurance: 20% (approximately $302) Admin fee co-insurance: 20% (approximately $25)  Deductible:   Secondary:  Prolia co-insurance:  Admin fee co-insurance:   Deductible:   Prior Auth: APPROVED PA# [id number] Valid: 03/08/22-03/09/23  ** This summary of benefits is an estimation of the patient's out-of-pocket cost. Exact cost may vary based on individual plan coverage.

## 2022-03-08 NOTE — Telephone Encounter (Signed)
Patient Advocate Encounter  Prior Authorization for Kristina Ayala has been approved.    PA# [id number] Effective dates: 03/08/22 through 03/09/23

## 2022-03-08 NOTE — Telephone Encounter (Signed)
Prolia VOB initiated via [email or web address] 

## 2022-03-10 ENCOUNTER — Ambulatory Visit
Admission: RE | Admit: 2022-03-10 | Discharge: 2022-03-10 | Disposition: A | Discharge status: Home / Self Care | Payer: 59 | Source: Ambulatory Visit | Attending: Family Medicine | Admitting: Family Medicine

## 2022-03-10 DIAGNOSIS — M5416 Radiculopathy, lumbar region: Secondary | ICD-10-CM

## 2022-03-10 MED ORDER — IOPAMIDOL (ISOVUE-M 200) INJECTION 41%: 1.0000 mL | Freq: Once | INTRAMUSCULAR | Status: DC

## 2022-03-10 MED ORDER — METHYLPREDNISOLONE ACETATE 40 MG/ML INJ SUSP (RADIOLOG: 80.0000 mg | Freq: Once | INTRAMUSCULAR | Status: DC

## 2022-03-10 NOTE — Discharge Instructions (Signed)

## 2022-03-11 ENCOUNTER — Other Ambulatory Visit: Payer: Self-pay

## 2022-03-11 ENCOUNTER — Telehealth: Payer: Self-pay | Admitting: Family Medicine

## 2022-03-11 ENCOUNTER — Ambulatory Visit: Payer: 59 | Admitting: Family Medicine

## 2022-03-11 DIAGNOSIS — L821 Other seborrheic keratosis: Secondary | ICD-10-CM | POA: Diagnosis not present

## 2022-03-11 DIAGNOSIS — M5416 Radiculopathy, lumbar region: Secondary | ICD-10-CM

## 2022-03-11 DIAGNOSIS — L720 Epidermal cyst: Secondary | ICD-10-CM | POA: Diagnosis not present

## 2022-03-11 NOTE — Telephone Encounter (Signed)
Pt called, she did NOT have the epidural yesterday as Dr. Jeralyn Ruths thinks she PT could be a good idea for her.  Can we order PT to [facility] for pt?

## 2022-03-11 NOTE — Telephone Encounter (Signed)
Order placed

## 2022-03-18 DIAGNOSIS — Z0001 Encounter for general adult medical examination with abnormal findings: Secondary | ICD-10-CM | POA: Diagnosis not present

## 2022-03-18 DIAGNOSIS — R197 Diarrhea, unspecified: Secondary | ICD-10-CM | POA: Diagnosis not present

## 2022-03-18 DIAGNOSIS — I10 Essential (primary) hypertension: Secondary | ICD-10-CM | POA: Diagnosis not present

## 2022-03-18 DIAGNOSIS — Z1159 Encounter for screening for other viral diseases: Secondary | ICD-10-CM | POA: Diagnosis not present

## 2022-03-18 DIAGNOSIS — Z0000 Encounter for general adult medical examination without abnormal findings: Secondary | ICD-10-CM | POA: Diagnosis not present

## 2022-03-18 DIAGNOSIS — E119 Type 2 diabetes mellitus without complications: Secondary | ICD-10-CM | POA: Diagnosis not present

## 2022-03-18 DIAGNOSIS — E559 Vitamin D deficiency, unspecified: Secondary | ICD-10-CM | POA: Diagnosis not present

## 2022-03-18 DIAGNOSIS — Z79899 Other long term (current) drug therapy: Secondary | ICD-10-CM | POA: Diagnosis not present

## 2022-03-19 ENCOUNTER — Other Ambulatory Visit: Payer: Self-pay | Admitting: Internal Medicine

## 2022-03-19 DIAGNOSIS — Z1231 Encounter for screening mammogram for malignant neoplasm of breast: Secondary | ICD-10-CM

## 2022-03-22 ENCOUNTER — Ambulatory Visit
Admission: RE | Admit: 2022-03-22 | Discharge: 2022-03-22 | Disposition: A | Discharge status: Home / Self Care | Payer: 59 | Source: Ambulatory Visit | Attending: Family Medicine | Admitting: Family Medicine

## 2022-03-22 ENCOUNTER — Encounter: Payer: Self-pay | Admitting: Family Medicine

## 2022-03-22 DIAGNOSIS — M47817 Spondylosis without myelopathy or radiculopathy, lumbosacral region: Secondary | ICD-10-CM | POA: Diagnosis not present

## 2022-03-22 MED ORDER — METHYLPREDNISOLONE ACETATE 40 MG/ML INJ SUSP (RADIOLOG
80.0000 mg | Freq: Once | INTRAMUSCULAR | Status: AC
  Administered 2022-03-22: 80 mg via EPIDURAL

## 2022-03-22 MED ORDER — IOPAMIDOL (ISOVUE-M 200) INJECTION 41%
1.0000 mL | Freq: Once | INTRAMUSCULAR | Status: AC
  Administered 2022-03-22: 1 mL via EPIDURAL

## 2022-03-22 NOTE — Discharge Instructions (Signed)

## 2022-03-23 NOTE — Telephone Encounter (Signed)
Forwarding to Rx Prior Auth Team 

## 2022-03-25 ENCOUNTER — Telehealth: Payer: Self-pay | Admitting: Internal Medicine

## 2022-03-25 ENCOUNTER — Other Ambulatory Visit: Payer: Self-pay

## 2022-03-25 MED ORDER — ATORVASTATIN CALCIUM 40 MG PO TABS: ORAL_TABLET | ORAL | 0 refills | Status: DC

## 2022-03-25 NOTE — Telephone Encounter (Signed)
Caller & Relationship to patient: Self  Call back number: [phone]   Date of last office visit: 1.11.24  Date of next office visit: 3.8.24  Medication(s) to be refilled:  atorvastatin (LIPITOR) 40 MG tablet   Preferred Pharmacy:  [id number]   Phone: [phone]  Fax: [phone]

## 2022-03-25 NOTE — Telephone Encounter (Signed)
Refill sent in today for patient.

## 2022-03-26 ENCOUNTER — Ambulatory Visit: Payer: 59 | Admitting: Physical Therapy

## 2022-03-29 ENCOUNTER — Ambulatory Visit (INDEPENDENT_AMBULATORY_CARE_PROVIDER_SITE_OTHER): Payer: 59 | Admitting: Family Medicine

## 2022-03-29 ENCOUNTER — Ambulatory Visit (INDEPENDENT_AMBULATORY_CARE_PROVIDER_SITE_OTHER): Payer: 59

## 2022-03-29 VITALS — BP 150/88 | HR 81 | Ht 65.0 in | Wt 167.0 lb

## 2022-03-29 DIAGNOSIS — M25571 Pain in right ankle and joints of right foot: Secondary | ICD-10-CM

## 2022-03-29 NOTE — Patient Instructions (Addendum)
Thank you for coming in today.   Try getting an arch strap for plantar fasciitis.  [employer] should sell one of there.  [address] [place], [place] [address]   You could use a cam walker boot if you really need to   When you get back from [place] we could consider physical therapy.

## 2022-03-29 NOTE — Progress Notes (Signed)
I, Peterson Lombard, LAT, ATC acting as a scribe for [name], MD.  Kristina Ayala is a 77 y.o. female who presents to [facility] today for right ankle pain. Pt was last seen by Dr. Tamala Julian on 09/16/21 for nondisplaced fx of the 3rd phalnx on the L foot. Today, pt reports R ankle pain ongoing since 2/15. Pt was trying to get into her car and the wind blew the door open, pt fell, rolling her R ankle. Pt locates pain to the lateral aspect of her R LE and ankle. Of note, pt is leaving for a trip to [facility] on Wed.  Her sister has stage IV cancer and she is planning a short trip to visit her.  R ankle swelling: no Aggravates: nothing- pt notes pain just comes and goes Treatments tried: ice, IBU, ace wrap   Pertinent review of systems: No fevers or chills  Relevant historical information: COPD   Exam:  BP (!) 150/88   Pulse 81   Ht '5\' 5"'$  (1.651 m)   Wt 167 lb (75.8 kg)   SpO2 99%   BMI 27.79 kg/m  General: Well Developed, well nourished, and in no acute distress.   MSK: Right ankle: Normal-appearing Tender palpation along the lateral aspect of the ankle.  Normal ankle motion. Stable ligamentous exam. Intact strength.  Right foot: Normal appearing Tender palpation plantar mid arch Normal foot and ankle motion.   Lab and Radiology Results No results found for this or any previous visit (from the past 72 hour(s)). DG Ankle Complete Right  Result Date: 03/29/2022 CLINICAL DATA:  Right ankle pain for 2.5 weeks after fall in a parking lot. EXAM: RIGHT ANKLE - COMPLETE 3+ VIEW COMPARISON:  Right tibia and fibula radiographs 05/12/2006 FINDINGS: Mild to moderate distal fibula and adjacent lateral talar process and mild distal medial malleolar degenerative spurring. The ankle mortise is symmetric and intact. Mild-to-moderate plantar calcaneal heel spur. Mild chronic spurring at the Achilles insertion on the calcaneus. No acute fracture or  dislocation. IMPRESSION: 1. No acute fracture. 2. Mild-to-moderate plantar calcaneal heel spur. Electronically Signed   By: Yvonne Kendall M.D.   On: 03/29/2022 10:30    I, [name], personally (independently) visualized and performed the interpretation of the images attached in this note.   Assessment and Plan: 77 y.o. female with right foot and ankle pain.  The lateral ankle pain thought to be due to tendinopathy lateral ankle structures such as the peroneal tendons.  This seems to be resolving.  Her bigger issue today is more mid arch pain.  Arch straps could be helpful.  Relative immobilization with a cam walker boot if she really needs it could be helpful.  Physical therapy upon her return back to the [place] area certainly could be helpful as well.  Plan to arrange all that.  Talked about making this upcoming travel more comfortable.   PDMP not reviewed this encounter. Orders Placed This Encounter  Procedures   DG Ankle Complete Right    Standing Status:   Future    Number of Occurrences:   1    Standing Expiration Date:   03/29/2023    Order Specific Question:   Reason for Exam (SYMPTOM  OR DIAGNOSIS REQUIRED)    Answer:   right ankle pain    Order Specific Question:   Preferred imaging location?    Answer:   [facility]   Ambulatory referral to Physical Therapy  Referral Priority:   Routine    Referral Type:   Physical Medicine    Referral Reason:   Specialty Services Required    Requested Specialty:   Physical Therapy    Number of Visits Requested:   1   No orders of the defined types were placed in this encounter.    Discussed warning signs or symptoms. Please see discharge instructions. Patient expresses understanding.   The above documentation has been reviewed and is accurate and complete [name], M.D.

## 2022-03-30 NOTE — Progress Notes (Signed)
Right ankle shows a heel spur at the Achilles tendon insertion at the back of the heel.  There is no visible abnormality where you hurt at the bottom of the foot or on the side of the ankle.[name]  No fracture.

## 2022-04-02 ENCOUNTER — Ambulatory Visit: Payer: 59 | Admitting: Physical Therapy

## 2022-04-02 ENCOUNTER — Ambulatory Visit: Payer: 59

## 2022-04-05 NOTE — Therapy (Deleted)
OUTPATIENT PHYSICAL THERAPY THORACOLUMBAR EVALUATION   Patient Name: Kristina Ayala MRN: [id number] DOB:1945-03-30, 77 y.o., female Today's Date: 04/05/2022  END OF SESSION:   Past Medical History:  Diagnosis Date   Arthritis    Bursitis    left elbow   Clostridium difficile infection    Diabetes mellitus without complication ([place])    Frequent falls    HLD (hyperlipidemia)    Hypertension    Infectious colitis    Past Surgical History:  Procedure Laterality Date   CATARACT EXTRACTION Bilateral    COLONOSCOPY     ORIF PROXIMAL TIBIAL PLATEAU FRACTURE Left 02/19/2012   knee   POLYPECTOMY     TONSILLECTOMY     Patient Active Problem List   Diagnosis Date Noted   Foot injury, left, initial encounter 09/16/2021   Tingling 03/27/2021   Fatigue 03/27/2021   Itchy scalp 12/09/2020   Excessive daytime sleepiness 11/03/2020   Coronary artery calcification seen on CAT scan 09/09/2020   Aortic atherosclerosis ([place]) 08/31/2020   Right rib fracture 07/10/2020   Left ankle injury 07/10/2020   Closed low lateral malleolus fracture 07/10/2020   Snoring 05/23/2020   Cough 12/04/2019   Pancreatic mass 11/08/2019   DOE (dyspnea on exertion) 03/20/2019   Chronic left SI joint pain 03/13/2019   Leg cramping 01/10/2019   Osteoarthritis 01/10/2019   Right lower quadrant abdominal pain 11/09/2018   Adnexal mass, right 11/09/2018   Lung nodules - stable - no f/u needed 06/19/2018   Thyromegaly 10/28/2017   Restless leg syndrome 10/12/2017   Greater trochanteric bursitis of left hip 07/26/2017   Hypokalemia 04/18/2017   Degenerative arthritis of left knee 12/01/2016   Anxiety 12/01/2016   Diabetes mellitus without complication ([place]) [id number]   Bursitis of right shoulder 09/04/2015   Lateral meniscus derangement 06/03/2015   Tear of LCL (lateral collateral ligament) of knee 06/03/2015   Lumbar radiculopathy 09/19/2014   Low back pain 01/04/2014   Bilateral knee pain  01/04/2014   Diverticulosis of colon without hemorrhage 07/26/2013   Diarrhea 07/26/2013   Trigger thumb of left hand 03/06/2013   Essential hypertension, benign 02/15/2013   Hyperlipidemia 09/28/2012   COPD (chronic obstructive pulmonary disease) ([place]) 05/22/2012   Osteoporosis 05/22/2012    PCP:    [name], MD    REFERRING PROVIDER: Lyndal Pulley, DO, Gregor Hams, MD   REFERRING DIAG: M54.16 (ICD-10-CM) - Lumbar radiculopathy  M25.571 (ICD-10-CM) - Acute right ankle pain   Rationale for Evaluation and Treatment: Rehabilitation  THERAPY DIAG:  No diagnosis found.  ONSET DATE: 03/11/22, 03/29/22  SUBJECTIVE:  SUBJECTIVE STATEMENT: ***  PERTINENT HISTORY:  Per Dr Georgina Snell, 03/29/22  Today, pt reports R ankle pain ongoing since 2/15. Pt was trying to get into her car and the wind blew the door open, pt fell, rolling her R ankle. Pt locates pain to the lateral aspect of her R LE and ankle.  77 y.o. female with right foot and ankle pain.  The lateral ankle pain thought to be due to tendinopathy lateral ankle structures such as the peroneal tendons.  This seems to be resolving.  Her bigger issue today is more mid arch pain.  Arch straps could be helpful.  Relative immobilization with a cam walker boot if she really needs it could be helpful.  Physical therapy upon her return back to the [place] area certainly could be helpful as well.  Plan to arrange all that.  Talked about making this upcoming travel more comfortable  PAIN:  Are you having pain? {OPRCPAIN:27236}  PRECAUTIONS: {Therapy precautions:24002}  WEIGHT BEARING RESTRICTIONS: {Yes ***/No:24003}  FALLS:  Has patient fallen in last 6 months? {fallsyesno:27318}  LIVING ENVIRONMENT: Lives with: {OPRC lives with:25569::"lives with  their family"} Lives in: {Lives in:25570} Stairs: {opstairs:27293} Has following equipment at home: {Assistive devices:23999}  OCCUPATION: ***  PLOF: {PLOF:24004}  PATIENT GOALS: ***  NEXT MD VISIT: ***  OBJECTIVE:   DIAGNOSTIC FINDINGS:  Ankle X ray 03/29/22 IMPRESSION: 1. No acute fracture. 2. Mild-to-moderate plantar calcaneal heel spur.   PATIENT SURVEYS:  {rehab surveys:24030}  SCREENING FOR RED FLAGS: Bowel or bladder incontinence: {Yes/No:304960894} Spinal tumors: {Yes/No:304960894} Cauda equina syndrome: {Yes/No:304960894} Compression fracture: {Yes/No:304960894} Abdominal aneurysm: {Yes/No:304960894}  COGNITION: Overall cognitive status: {cognition:24006}     SENSATION: {sensation:27233}  MUSCLE LENGTH: Hamstrings: Right *** deg; Left *** deg Thomas test: Right *** deg; Left *** deg  POSTURE: {posture:25561}  PALPATION: ***  LUMBAR ROM:   AROM eval  Flexion   Extension   Right lateral flexion   Left lateral flexion   Right rotation   Left rotation    (Blank rows = not tested)  LOWER EXTREMITY ROM:     {AROM/PROM:27142}  Right eval Left eval  Hip flexion    Hip extension    Hip abduction    Hip adduction    Hip internal rotation    Hip external rotation    Knee flexion    Knee extension    Ankle dorsiflexion    Ankle plantarflexion    Ankle inversion    Ankle eversion     (Blank rows = not tested)  LOWER EXTREMITY MMT:    MMT Right eval Left eval  Hip flexion    Hip extension    Hip abduction    Hip adduction    Hip internal rotation    Hip external rotation    Knee flexion    Knee extension    Ankle dorsiflexion    Ankle plantarflexion    Ankle inversion    Ankle eversion     (Blank rows = not tested)  LUMBAR SPECIAL TESTS:  {lumbar special test:25242}  FUNCTIONAL TESTS:  {Functional tests:24029}  GAIT: Distance walked: *** Assistive device utilized: {Assistive devices:23999} Level of assistance: {Levels of  assistance:24026} Comments: ***  TODAY'S TREATMENT:  DATE: ***    PATIENT EDUCATION:  Education details: *** Person educated: {Person educated:25204} Education method: {Education Method:25205} Education comprehension: {Education Comprehension:25206}  HOME EXERCISE PROGRAM: ***  ASSESSMENT:  CLINICAL IMPRESSION: Patient is a *** y.o. *** who was seen today for physical therapy evaluation and treatment for ***.   OBJECTIVE IMPAIRMENTS: {opptimpairments:25111}.   ACTIVITY LIMITATIONS: {activitylimitations:27494}  PARTICIPATION LIMITATIONS: {participationrestrictions:25113}  PERSONAL FACTORS: {Personal factors:25162} are also affecting patient's functional outcome.   REHAB POTENTIAL: {rehabpotential:25112}  CLINICAL DECISION MAKING: {clinical decision making:25114}  EVALUATION COMPLEXITY: {Evaluation complexity:25115}   GOALS: Goals reviewed with patient? {yes/no:20286}  SHORT TERM GOALS: Target date: ***  *** Baseline: Goal status: {GOALSTATUS:25110}  2.  *** Baseline:  Goal status: {GOALSTATUS:25110}  3.  *** Baseline:  Goal status: {GOALSTATUS:25110}  4.  *** Baseline:  Goal status: {GOALSTATUS:25110}  5.  *** Baseline:  Goal status: {GOALSTATUS:25110}  6.  *** Baseline:  Goal status: {GOALSTATUS:25110}  LONG TERM GOALS: Target date: ***  *** Baseline:  Goal status: {GOALSTATUS:25110}  2.  *** Baseline:  Goal status: {GOALSTATUS:25110}  3.  *** Baseline:  Goal status: {GOALSTATUS:25110}  4.  *** Baseline:  Goal status: {GOALSTATUS:25110}  5.  *** Baseline:  Goal status: {GOALSTATUS:25110}  6.  *** Baseline:  Goal status: {GOALSTATUS:25110}  PLAN:  PT FREQUENCY: {rehab frequency:25116}  PT DURATION: {rehab duration:25117}  PLANNED INTERVENTIONS: {rehab planned interventions:25118::"Therapeutic  exercises","Therapeutic activity","Neuromuscular re-education","Balance training","Gait training","Patient/Family education","Self Care","Joint mobilization"}.  PLAN FOR NEXT SESSION: ***   Marcelina Morel, DPT 04/05/2022, 4:47 PM

## 2022-04-07 ENCOUNTER — Ambulatory Visit: Payer: 59 | Admitting: Physical Therapy

## 2022-04-09 ENCOUNTER — Other Ambulatory Visit: Payer: Self-pay | Admitting: Internal Medicine

## 2022-04-12 ENCOUNTER — Encounter: Payer: Self-pay | Admitting: Internal Medicine

## 2022-04-12 ENCOUNTER — Ambulatory Visit (INDEPENDENT_AMBULATORY_CARE_PROVIDER_SITE_OTHER): Payer: 59 | Admitting: Internal Medicine

## 2022-04-12 ENCOUNTER — Ambulatory Visit: Payer: 59

## 2022-04-12 VITALS — BP 134/72 | HR 82 | Temp 98.2°F | Ht 65.0 in | Wt 162.0 lb

## 2022-04-12 DIAGNOSIS — R151 Fecal smearing: Secondary | ICD-10-CM | POA: Insufficient documentation

## 2022-04-12 DIAGNOSIS — I10 Essential (primary) hypertension: Secondary | ICD-10-CM

## 2022-04-12 DIAGNOSIS — E782 Mixed hyperlipidemia: Secondary | ICD-10-CM | POA: Diagnosis not present

## 2022-04-12 DIAGNOSIS — R002 Palpitations: Secondary | ICD-10-CM | POA: Diagnosis not present

## 2022-04-12 NOTE — Assessment & Plan Note (Signed)
Chronic Blood pressure looks good here today, but she has been taking her medications every other day because of side effects-she did take them this morning Advised taking medication daily, but switch amlodipine to nighttime Continue lisinopril and hydrochlorothiazide in the morning Monitor blood pressure and heart rate daily

## 2022-04-12 NOTE — Progress Notes (Signed)
Subjective:    Patient ID: Kristina Ayala, female    DOB: 03/26/1945, 77 y.o.   MRN: [id number]      HPI Kristina Ayala is here for  Chief Complaint  Patient presents with   Medical Management of Chronic Issues    Medication is causing heart palpations and nausea     After taking her BP meds - she gets dizzy, her heart races x 2-3 months.  The symptoms last about one hour.  She typically takes her cholesterol medication and 3 blood pressure medications in the morning.  She is concerned her symptoms are side effect from the medications.  Once the symptoms go away after about an hour she feels fine the rest of the day.  She has started taking her medication every other day because of that.  She does not monitor her blood pressure or heart rate at home.   Has a leakage from the rectum.  Stool is formed - once a day on average.  Water stool is what leaks - at end of day - some days. During the day does not have diarrhea/water stool.  No hemorrhoids.   No urine incontinence.      Medications and allergies reviewed with patient and updated if appropriate.  Current Outpatient Medications on File Prior to Visit  Medication Sig Dispense Refill   ALPRAZolam (XANAX) 0.5 MG tablet TAKE 1 TABLET(0.5 MG) BY MOUTH AT BEDTIME AS NEEDED FOR ANXIETY 30 tablet 0   amLODipine (NORVASC) 10 MG tablet      atorvastatin (LIPITOR) 40 MG tablet TAKE 1 TABLET(40 MG) BY MOUTH DAILY. FOLLOW-UP APPOINTMENT WITH LABS ARE DUE 90 tablet 0   Camphor-Eucalyptus-Menthol (VICKS VAPORUB EX) Apply 1 application topically as needed (congestion).     cholecalciferol (VITAMIN D) 25 MCG (1000 UNIT) tablet Take 2,000 Units by mouth daily.     Cyanocobalamin (VITAMIN B-12) 2500 MCG SUBL Place 2,500 mcg under the tongue daily.     denosumab (PROLIA) 60 MG/ML SOSY injection INJECT 60MG  INTO THE SKIN AS DIRECTED 1 mL 0   fluticasone (FLONASE) 50 MCG/ACT nasal spray Place 2 sprays into both nostrils daily. 16 g 6    gabapentin (NEURONTIN) 100 MG capsule Take 2 capsules (200 mg total) by mouth at bedtime. 180 capsule 0   hydrochlorothiazide (HYDRODIURIL) 25 MG tablet TAKE 1 TABLET(25 MG) BY MOUTH EVERY MORNING 90 tablet 3   ipratropium (ATROVENT) 0.06 % nasal spray Place 2 sprays into both nostrils 4 (four) times daily. As needed for nasal congestion, runny nose 15 mL 0   lactobacillus acidophilus & bulgar (LACTINEX) chewable tablet Chew 1 tablet by mouth 3 (three) times daily with meals. 21 tablet 0   lisinopril (ZESTRIL) 40 MG tablet TAKE 1 TABLET(40 MG) BY MOUTH DAILY 90 tablet 2   ondansetron (ZOFRAN) 4 MG tablet Take 1 tablet (4 mg total) by mouth every 6 (six) hours as needed for nausea or vomiting. 15 tablet 0   potassium chloride (KLOR-CON) 10 MEQ tablet Take 10 mEq by mouth daily.     TURMERIC PO Take 1 tablet by mouth daily.     No current facility-administered medications on file prior to visit.    Review of Systems  Constitutional:  Negative for fever.  Respiratory:  Negative for shortness of breath.   Cardiovascular:  Positive for palpitations. Negative for chest pain.  Gastrointestinal:  Negative for constipation and diarrhea.  Neurological:  Positive for dizziness. Negative for headaches.  Objective:   Vitals:   04/12/22 0912  BP: 134/72  Pulse: 82  Temp: 98.2 F (36.8 C)  SpO2: 99%   BP Readings from Last 3 Encounters:  04/12/22 134/72  03/29/22 (!) 150/88  03/22/22 126/85   Wt Readings from Last 3 Encounters:  04/12/22 162 lb (73.5 kg)  03/29/22 167 lb (75.8 kg)  02/18/22 166 lb (75.3 kg)   Body mass index is 26.96 kg/m.    Physical Exam Constitutional:      General: She is not in acute distress.    Appearance: Normal appearance. She is not ill-appearing.  HENT:     Head: Normocephalic and atraumatic.     Right Ear: Tympanic membrane, ear canal and external ear normal.     Left Ear: Tympanic membrane, ear canal and external ear normal.     Mouth/Throat:      Mouth: Mucous membranes are moist.     Pharynx: No oropharyngeal exudate or posterior oropharyngeal erythema.  Eyes:     Conjunctiva/sclera: Conjunctivae normal.  Cardiovascular:     Rate and Rhythm: Normal rate and regular rhythm.     Heart sounds: Murmur (2/6 sys) heard.  Pulmonary:     Effort: Pulmonary effort is normal. No respiratory distress.     Breath sounds: Normal breath sounds. No wheezing or rales.  Musculoskeletal:     Cervical back: Neck supple. No tenderness.  Lymphadenopathy:     Cervical: No cervical adenopathy.  Skin:    General: Skin is warm and dry.  Neurological:     Mental Status: She is alert.            Assessment & Plan:    See Problem List for Assessment and Plan of chronic medical problems.

## 2022-04-12 NOTE — Assessment & Plan Note (Signed)
New For 2-3 months states dizziness, palpitations after taking her atorvastatin and 3 blood pressure medications in the morning.  Symptoms lasted approximately 1 hour and then she is fine without symptoms the rest of the day She is concerned that the side effects are related to one of her medications-no changes recently in these medications for dosing She does not pressure or heart rate at home Has been taking the medications the other day because of Discussed importance of keeping blood pressure and heart rate well-controlled and wanting her to take medications on a regular/daily basis, but may need to adjust them Start monitoring BP and heart rate daily Change atorvastatin and amlodipine to nighttime and continue lisinopril and hydrochlorothiazide in the morning She will monitor for symptoms and we can reevaluate her blood pressure and heart rate and adjust medications as needed

## 2022-04-12 NOTE — Assessment & Plan Note (Signed)
New States some fecal incontinence, fecal smearing Typically notices it when she wipes-sometimes may have a small amount in her underwear Has normal stools about every day-denies constipation or diarrhea States the fecal incontinence is more in the evening and it is watery or loose stool.  She denies any hemorrhoids.  She denies blood in the stool. Denies urinary incontinence Referral to GI for further evaluation

## 2022-04-12 NOTE — Patient Instructions (Addendum)
    Dentist that does oral appliances for snoring Dr Augustina Mood [address] Dr Natividad Brood Phone: [phone]   Medications changes include :   try move atorvastatin and amlodipine to night time - monitor for side effects   Start monitoring your BP and HR at home daily.    A referral was ordered for GI.   Return for follow up in June, sooner if needed.

## 2022-04-12 NOTE — Assessment & Plan Note (Signed)
Chronic Has been on atorvastatin for years-I do not think that is the cause of her symptoms Will try switching it to nighttime with her amlodipine to then we can narrow down if her symptoms are medication related or if she is on too much medication so we can make adjustments

## 2022-04-14 ENCOUNTER — Telehealth: Payer: Self-pay | Admitting: Family Medicine

## 2022-04-14 NOTE — Telephone Encounter (Signed)
Patient called back in response to her xray. I gave her Dr Clovis Riley response. She asked what she should do for the heel spur.   Please advise.

## 2022-04-15 ENCOUNTER — Ambulatory Visit (INDEPENDENT_AMBULATORY_CARE_PROVIDER_SITE_OTHER): Payer: 59 | Admitting: *Deleted

## 2022-04-15 DIAGNOSIS — M810 Age-related osteoporosis without current pathological fracture: Secondary | ICD-10-CM | POA: Diagnosis not present

## 2022-04-15 MED ORDER — DENOSUMAB 60 MG/ML ~~LOC~~ SOSY
60.0000 mg | PREFILLED_SYRINGE | Freq: Once | SUBCUTANEOUS | Status: AC
  Administered 2022-04-15: 60 mg via SUBCUTANEOUS

## 2022-04-15 NOTE — Progress Notes (Signed)
Pls cosign for Prolia inj..Pt brought her own prolia from [facility]...Kristina Ayala

## 2022-04-16 NOTE — Telephone Encounter (Signed)
Patient called back. I gave her Dr Clovis Riley response.  She is going to discuss this further with Dr Tamala Julian at her current scheduled appointment (4/4). In the meantime she will continue using ibuprofen, ice, comfortable shoes and resting when able.

## 2022-04-16 NOTE — Telephone Encounter (Signed)
I can treat you for the heel spur.  The heel spur is more of a symptom of problems with the bottom of your foot than an actual problem itself.  We can talk about it in full detail and I can show you the pictures when you come back.

## 2022-04-19 NOTE — Telephone Encounter (Signed)
Noted, thank you

## 2022-04-28 NOTE — Progress Notes (Unsigned)
Kristina Ayala [facility] [address] [place] Phone: [phone] Subjective:   IVilma Meckel, am serving as a scribe for Dr. Hulan Saas.  I'm seeing this patient by the request  of:  [name], MD  CC: Right ankle pain  [id number]  Kristina Ayala is a 77 y.o. female coming in with complaint of R ankle pain. Last seen by Dr. Georgina Snell for ankle pain in March. Patient states that her pain is located in medial longitudinal arch. Got cushion for arch which was somewhat helpful. Has been rolling foot on frozen bottle of water.   Back pain has increased due to walking with antalgic gait.      Past Medical History:  Diagnosis Date   Arthritis    Bursitis    left elbow   Clostridium difficile infection    Diabetes mellitus without complication (HCC)    Frequent falls    HLD (hyperlipidemia)    Hypertension    Infectious colitis    Past Surgical History:  Procedure Laterality Date   CATARACT EXTRACTION Bilateral    COLONOSCOPY     ORIF PROXIMAL TIBIAL PLATEAU FRACTURE Left 02/19/2012   knee   POLYPECTOMY     TONSILLECTOMY     Social History   Socioeconomic History   Marital status: Divorced    Spouse name: Not on file   Number of children: 2   Years of education: Not on file   Highest education level: Associate degree: academic program  Occupational History   Occupation: RETIRED  Tobacco Use   Smoking status: Former    Packs/day: 0.25    Years: 50.00    Additional pack years: 0.00    Total pack years: 12.50    Types: Cigarettes    Quit date: 08/28/2014    Years since quitting: 7.6   Smokeless tobacco: Never   Tobacco comments:    1 pack every 3 days  Vaping Use   Vaping Use: Never used  Substance and Sexual Activity   Alcohol use: No    Alcohol/week: 0.0 standard drinks of alcohol   Drug use: No   Sexual activity: Not Currently  Other Topics Concern   Not on file  Social History Narrative   Lives alone     Right handed   Caffeine: 3x weekly         Social Determinants of Health   Financial Resource Strain: Low Risk  (02/20/2021)   Overall Financial Resource Strain (CARDIA)    Difficulty of Paying Living Expenses: Not hard at all  Food Insecurity: No Food Insecurity (02/20/2021)   Hunger Vital Sign    Worried About Running Out of Food in the Last Year: Never true    [place] in the Last Year: Never true  Transportation Needs: No Transportation Needs (02/20/2021)   PRAPARE - Hydrologist (Medical): No    Lack of Transportation (Non-Medical): No  Physical Activity: Inactive (02/20/2021)   Exercise Vital Sign    Days of Exercise per Week: 0 days    Minutes of Exercise per Session: 0 min  Stress: No Stress Concern Present (02/20/2021)   [place]    Feeling of Stress : Not at all  Social Connections: Moderately Integrated (02/20/2021)   Social Connection and Isolation Panel [NHANES]    Frequency of Communication with Friends and Family: More than three times a week  Frequency of Social Gatherings with Friends and Family: More than three times a week    Attends Religious Services: More than 4 times per year    Active Member of Clubs or Organizations: Yes    Attends Archivist Meetings: More than 4 times per year    Marital Status: Widowed   Allergies  Allergen Reactions   Doxycycline Diarrhea   Minocycline Diarrhea   Wellbutrin [Bupropion] Swelling    Per pt her tongue was swollen and sore/symptoms stopped once the medication was discontinued.    Penicillins Rash    Has patient had a PCN reaction causing immediate rash, facial/tongue/throat swelling, SOB or lightheadedness with hypotension: no Has patient had a PCN reaction causing severe rash involving mucus membranes or skin necrosis: no Has patient had a PCN reaction that required hospitalization: unknown Has patient had  a PCN reaction occurring within the last 10 years: no If all of the above answers are "NO", then may proceed with Cephalosporin use.    Z-Pak [Azithromycin] Other (See Comments)    diarrhea   Family History  Problem Relation Age of Onset   Diverticulosis Mother    Ovarian cancer Mother    Pancreatic cancer Father    Lung cancer Father    Colon polyps Sister    Colon cancer Neg Hx    Breast cancer Neg Hx     Current Outpatient Medications (Endocrine & Metabolic):    denosumab (PROLIA) 60 MG/ML SOSY injection, INJECT 60MG  INTO THE SKIN AS DIRECTED  Current Outpatient Medications (Cardiovascular):    amLODipine (NORVASC) 10 MG tablet,    atorvastatin (LIPITOR) 40 MG tablet, TAKE 1 TABLET(40 MG) BY MOUTH DAILY. FOLLOW-UP APPOINTMENT WITH LABS ARE DUE   hydrochlorothiazide (HYDRODIURIL) 25 MG tablet, TAKE 1 TABLET(25 MG) BY MOUTH EVERY MORNING   lisinopril (ZESTRIL) 40 MG tablet, TAKE 1 TABLET(40 MG) BY MOUTH DAILY  Current Outpatient Medications (Respiratory):    Camphor-Eucalyptus-Menthol (VICKS VAPORUB EX), Apply 1 application topically as needed (congestion).   fluticasone (FLONASE) 50 MCG/ACT nasal spray, Place 2 sprays into both nostrils daily.   ipratropium (ATROVENT) 0.06 % nasal spray, Place 2 sprays into both nostrils 4 (four) times daily. As needed for nasal congestion, runny nose   Current Outpatient Medications (Hematological):    Cyanocobalamin (VITAMIN B-12) 2500 MCG SUBL, Place 2,500 mcg under the tongue daily.  Current Outpatient Medications (Other):    ALPRAZolam (XANAX) 0.5 MG tablet, TAKE 1 TABLET(0.5 MG) BY MOUTH AT BEDTIME AS NEEDED FOR ANXIETY   cholecalciferol (VITAMIN D) 25 MCG (1000 UNIT) tablet, Take 2,000 Units by mouth daily.   gabapentin (NEURONTIN) 100 MG capsule, Take 2 capsules (200 mg total) by mouth at bedtime.   lactobacillus acidophilus & bulgar (LACTINEX) chewable tablet, Chew 1 tablet by mouth 3 (three) times daily with meals.   ondansetron  (ZOFRAN) 4 MG tablet, Take 1 tablet (4 mg total) by mouth every 6 (six) hours as needed for nausea or vomiting.   potassium chloride (KLOR-CON) 10 MEQ tablet, Take 10 mEq by mouth daily.   TURMERIC PO, Take 1 tablet by mouth daily.   Reviewed prior external information including notes and imaging from  primary care provider As well as notes that were available from care everywhere and other healthcare systems.  Past medical history, social, surgical and family history all reviewed in electronic medical record.  No pertanent information unless stated regarding to the chief complaint.   Review of Systems:  No headache, visual changes, nausea, vomiting,  diarrhea, constipation, dizziness, abdominal pain, skin rash, fevers, chills, night sweats, weight loss, swollen lymph nodes, body aches, joint swelling, chest pain, shortness of breath, mood changes. POSITIVE muscle aches  Objective  Blood pressure 122/82, pulse 89, height 5\' 5"  (1.651 m), weight 165 lb (74.8 kg), SpO2 98 %.   General: No apparent distress alert and oriented x3 mood and affect normal, dressed appropriately.  HEENT: Pupils equal, extraocular movements intact  Respiratory: Patient's speak in full sentences and does not appear short of breath  Cardiovascular: No lower extremity edema, non tender, no erythema  Right ankle does have breakdown of the transverse arch noted of the foot.  Patient does have a very narrow heel noted.  Tender to palpation over the medial portion of the medial arch.  No significant increase in pain over the navicular bone.  Tightness noted of the posterior tibialis tendon.  Limited muscular skeletal ultrasound was performed and interpreted by Hulan Saas, M  Limited ultrasound does show some hypoechoic changes noted in the tendon sheath.  No increase in Doppler flow.  No tearing appreciated.  No cortical irregularity noted at the navicular bone. Impression: Posterior tibialis tendinitis  [id number]; 15  additional minutes spent for Therapeutic exercises as stated in above notes.  This included exercises focusing on stretching, strengthening, with significant focus on eccentric aspects.   Long term goals include an improvement in range of motion, strength, endurance as well as avoiding reinjury. Patient's frequency would include in 1-2 times a day, 3-5 times a week for a duration of 6-12 weeks.Ankle strengthening that included:  Basic range of motion exercises to allow proper full motion at ankle Stretching of the lower leg and hamstrings  Theraband exercises for the lower leg - inversion, eversion, dorsiflexion and plantarflexion each to be completed with a theraband Balance exercises to increase proprioception Weight bearing exercises to increase strength and balance   Proper technique shown and discussed handout in great detail with ATC.  All questions were discussed and answered.      Impression and Recommendations:    The above documentation has been reviewed and is accurate and complete Lyndal Pulley, DO

## 2022-04-29 ENCOUNTER — Ambulatory Visit (INDEPENDENT_AMBULATORY_CARE_PROVIDER_SITE_OTHER): Payer: 59 | Admitting: Family Medicine

## 2022-04-29 ENCOUNTER — Encounter: Payer: Self-pay | Admitting: Family Medicine

## 2022-04-29 ENCOUNTER — Other Ambulatory Visit: Payer: Self-pay

## 2022-04-29 VITALS — BP 122/82 | HR 89 | Ht 65.0 in | Wt 165.0 lb

## 2022-04-29 DIAGNOSIS — G8929 Other chronic pain: Secondary | ICD-10-CM

## 2022-04-29 DIAGNOSIS — M76821 Posterior tibial tendinitis, right leg: Secondary | ICD-10-CM | POA: Insufficient documentation

## 2022-04-29 DIAGNOSIS — M25571 Pain in right ankle and joints of right foot: Secondary | ICD-10-CM

## 2022-04-29 NOTE — Patient Instructions (Signed)
[facility] orthotics total support Exercises See me again in 6 weeks

## 2022-04-29 NOTE — Assessment & Plan Note (Signed)
Tendinitis noted with some longitudinal breakdown.  Discussed with patient about over-the-counter orthotics, home exercises and work with Product/process development scientist, discussed icing regimen.  Topical anti-inflammatories can be beneficial as well.  Patient does have breakdown of the transverse arch that may be contributing and we did discuss which activities to do and which ones to avoid.  Discussed the importance of a metatarsal pad and that will be what patient over-the-counter orthotics.  Follow-up again in 6 to 8 weeks worsening pain consider injection and formal physical therapy.

## 2022-05-03 DIAGNOSIS — Z809 Family history of malignant neoplasm, unspecified: Secondary | ICD-10-CM | POA: Diagnosis not present

## 2022-05-07 ENCOUNTER — Ambulatory Visit (HOSPITAL_COMMUNITY)
Admission: EM | Admit: 2022-05-07 | Discharge: 2022-05-07 | Disposition: A | Discharge status: Home / Self Care | Payer: 59 | Attending: Emergency Medicine | Admitting: Emergency Medicine

## 2022-05-07 ENCOUNTER — Inpatient Hospital Stay: Admission: RE | Admit: 2022-05-07 | Payer: 59 | Source: Ambulatory Visit

## 2022-05-07 ENCOUNTER — Encounter (HOSPITAL_COMMUNITY): Payer: Self-pay | Admitting: Emergency Medicine

## 2022-05-07 DIAGNOSIS — H60391 Other infective otitis externa, right ear: Secondary | ICD-10-CM | POA: Diagnosis not present

## 2022-05-07 DIAGNOSIS — H6120 Impacted cerumen, unspecified ear: Secondary | ICD-10-CM | POA: Diagnosis not present

## 2022-05-07 DIAGNOSIS — L0232 Furuncle of buttock: Secondary | ICD-10-CM | POA: Diagnosis not present

## 2022-05-07 MED ORDER — CIPROFLOXACIN-DEXAMETHASONE 0.3-0.1 % OT SUSP: 4.0000 [drp] | Freq: Two times a day (BID) | OTIC | 0 refills | Status: DC

## 2022-05-07 NOTE — ED Provider Notes (Addendum)
[facility]    CSN: [id number] Arrival date & time: 05/07/22  0941      History   Chief Complaint Chief Complaint  Patient presents with   Ear Problem    HPI Kristina Ayala is a 77 y.o. female.    Reports she was cleaning out her ears with a q-tip this morning when she noticed blood on her Q-tip from her right ear.  Denies any ear pain or discharge.  Uses Q-tips daily.  Reports she was at her GYN earlier today and he looked at it and he said her ear looked abnormal.   The history is provided by the patient and medical records.    Past Medical History:  Diagnosis Date   Arthritis    Bursitis    left elbow   Clostridium difficile infection    Diabetes mellitus without complication    Frequent falls    HLD (hyperlipidemia)    Hypertension    Infectious colitis     Patient Active Problem List   Diagnosis Date Noted   Tibialis posterior tendinitis, right 04/29/2022   Palpitations 04/12/2022   Fecal smearing 04/12/2022   Foot injury, left, initial encounter 09/16/2021   Tingling 03/27/2021   Fatigue 03/27/2021   Itchy scalp 12/09/2020   Excessive daytime sleepiness 11/03/2020   Coronary artery calcification seen on CAT scan 09/09/2020   Aortic atherosclerosis 08/31/2020   Right rib fracture 07/10/2020   Left ankle injury 07/10/2020   Closed low lateral malleolus fracture 07/10/2020   Snoring 05/23/2020   Cough 12/04/2019   Pancreatic mass 11/08/2019   DOE (dyspnea on exertion) 03/20/2019   Chronic left SI joint pain 03/13/2019   Leg cramping 01/10/2019   Osteoarthritis 01/10/2019   Right lower quadrant abdominal pain 11/09/2018   Adnexal mass, right 11/09/2018   Lung nodules - stable - no f/u needed 06/19/2018   Thyromegaly 10/28/2017   Restless leg syndrome 10/12/2017   Greater trochanteric bursitis of left hip 07/26/2017   Hypokalemia 04/18/2017   Degenerative arthritis of left knee 12/01/2016   Anxiety 12/01/2016   Diabetes mellitus  without complication 09/16/2016   Bursitis of right shoulder 09/04/2015   Lateral meniscus derangement 06/03/2015   Tear of LCL (lateral collateral ligament) of knee 06/03/2015   Lumbar radiculopathy 09/19/2014   Low back pain 01/04/2014   Bilateral knee pain 01/04/2014   Diverticulosis of colon without hemorrhage 07/26/2013   Diarrhea 07/26/2013   Trigger thumb of left hand 03/06/2013   Essential hypertension, benign 02/15/2013   Hyperlipidemia 09/28/2012   COPD (chronic obstructive pulmonary disease) 05/22/2012   Osteoporosis 05/22/2012    Past Surgical History:  Procedure Laterality Date   CATARACT EXTRACTION Bilateral    COLONOSCOPY     ORIF PROXIMAL TIBIAL PLATEAU FRACTURE Left 02/19/2012   knee   POLYPECTOMY     TONSILLECTOMY      OB History   No obstetric history on file.      Home Medications    Prior to Admission medications   Medication Sig Start Date End Date Taking? Authorizing Provider  ciprofloxacin-dexamethasone (CIPRODEX) OTIC suspension Place 4 drops into the right ear 2 (two) times daily for 7 days. 05/07/22 05/14/22 Yes Rinaldo Ratel, [place] N, FNP  ALPRAZolam Prudy Feeler) 0.5 MG tablet TAKE 1 TABLET(0.5 MG) BY MOUTH AT BEDTIME AS NEEDED FOR ANXIETY 01/20/21   Etta Grandchild, MD  amLODipine (NORVASC) 10 MG tablet     [provider]  atorvastatin (LIPITOR) 40 MG tablet TAKE 1  TABLET(40 MG) BY MOUTH DAILY. FOLLOW-UP APPOINTMENT WITH LABS ARE DUE 03/25/22   Pincus Sanes, MD  Camphor-Eucalyptus-Menthol [facility] VAPORUB EX) Apply 1 application topically as needed (congestion).    [provider]  cholecalciferol (VITAMIN D) 25 MCG (1000 UNIT) tablet Take 2,000 Units by mouth daily.    [provider]  Cyanocobalamin (VITAMIN B-12) 2500 MCG SUBL Place 2,500 mcg under the tongue daily.    [provider]  denosumab (PROLIA) 60 MG/ML SOSY injection INJECT 60MG  INTO THE SKIN AS DIRECTED 04/12/22   Pincus Sanes, MD  fluticasone (FLONASE)  50 MCG/ACT nasal spray Place 2 sprays into both nostrils daily. 10/07/20   Pincus Sanes, MD  gabapentin (NEURONTIN) 100 MG capsule Take 2 capsules (200 mg total) by mouth at bedtime. 10/28/21   Judi Saa, DO  hydrochlorothiazide (HYDRODIURIL) 25 MG tablet TAKE 1 TABLET(25 MG) BY MOUTH EVERY MORNING 02/26/22   Burns, Bobette Mo, MD  ipratropium (ATROVENT) 0.06 % nasal spray Place 2 sprays into both nostrils 4 (four) times daily. As needed for nasal congestion, runny nose 04/13/21   Theadora Rama Scales, PA-C  lactobacillus acidophilus & bulgar (LACTINEX) chewable tablet Chew 1 tablet by mouth 3 (three) times daily with meals. 01/26/22   Merrilee Jansky, MD  lisinopril (ZESTRIL) 40 MG tablet TAKE 1 TABLET(40 MG) BY MOUTH DAILY 02/26/22   Pincus Sanes, MD  ondansetron (ZOFRAN) 4 MG tablet Take 1 tablet (4 mg total) by mouth every 6 (six) hours as needed for nausea or vomiting. 01/21/22   Al Decant, PA-C  potassium chloride (KLOR-CON) 10 MEQ tablet Take 10 mEq by mouth daily. 12/30/21   [provider]  TURMERIC PO Take 1 tablet by mouth daily.    [provider]    Family History Family History  Problem Relation Age of Onset   Diverticulosis Mother    Ovarian cancer Mother    Pancreatic cancer Father    Lung cancer Father    Colon polyps Sister    Colon cancer Neg Hx    Breast cancer Neg Hx     Social History Social History   Tobacco Use   Smoking status: Former    Packs/day: 0.25    Years: 50.00    Additional pack years: 0.00    Total pack years: 12.50    Types: Cigarettes    Quit date: 08/28/2014    Years since quitting: 7.6   Smokeless tobacco: Never   Tobacco comments:    1 pack every 3 days  Vaping Use   Vaping Use: Never used  Substance Use Topics   Alcohol use: No    Alcohol/week: 0.0 standard drinks of alcohol   Drug use: No     Allergies   Doxycycline, Minocycline, Wellbutrin [bupropion], Penicillins, and Z-pak  [azithromycin]   Review of Systems Review of Systems  HENT:  Positive for ear discharge.      Physical Exam Triage Vital Signs ED Triage Vitals  Enc Vitals Group     BP 05/07/22 1028 (!) 150/84     Pulse Rate 05/07/22 1028 81     Resp 05/07/22 1028 18     Temp 05/07/22 1028 97.8 F (36.6 C)     Temp Source 05/07/22 1028 Oral     SpO2 05/07/22 1028 97 %     Weight --      Height --      Head Circumference --      Peak Flow --  Pain Score 05/07/22 1027 0     Pain Loc --      Pain Edu? --      Excl. in GC? --    No data found.  Updated Vital Signs BP (!) 150/84 (BP Location: Right Arm)   Pulse 81   Temp 97.8 F (36.6 C) (Oral)   Resp 18   SpO2 97%   Visual Acuity Right Eye Distance:   Left Eye Distance:   Bilateral Distance:    Right Eye Near:   Left Eye Near:    Bilateral Near:     Physical Exam Vitals and nursing note reviewed.  Constitutional:      Appearance: Normal appearance.  HENT:     Head: Normocephalic and atraumatic.     Right Ear: Tympanic membrane normal. Drainage and swelling present.     Left Ear: Tympanic membrane, ear canal and external ear normal.     Ears:     Comments: Extensive cerumen in right external auditory canal with dried blood prior to irrigation.  After irrigation, canal is swollen with some purulent discharge.  Right-sided tympanic membrane intact and pearly gray.    Nose: Nose normal.     Mouth/Throat:     Mouth: Mucous membranes are moist.  Eyes:     General: No scleral icterus.       Right eye: No discharge.        Left eye: No discharge.     Conjunctiva/sclera: Conjunctivae normal.  Cardiovascular:     Rate and Rhythm: Normal rate and regular rhythm.  Pulmonary:     Effort: Pulmonary effort is normal. No respiratory distress.  Skin:    General: Skin is warm and dry.     Capillary Refill: Capillary refill takes less than 2 seconds.  Neurological:     Mental Status: She is alert and oriented to person, place,  and time.  Psychiatric:        Mood and Affect: Mood normal.        Behavior: Behavior normal. Behavior is cooperative.      UC Treatments / Results  Labs (all labs ordered are listed, but only abnormal results are displayed) Labs Reviewed - No data to display  EKG   Radiology No results found.  Procedures Procedures (including critical care time)  Medications Ordered in UC Medications - No data to display  Initial Impression / Assessment and Plan / UC Course  I have reviewed the triage vital signs and the nursing notes.  Pertinent labs & imaging results that were available during my care of the patient were reviewed by me and considered in my medical decision making (see chart for details).  Vitals in triage reviewed, patient is hemodynamically stable.  Right external ear canal with wax buildup, tympanic membrane intact and pearly gray.  There is scant blood in the external auditory canal.  After irrigation, wax is cleared in canal is inflamed with purulent discharge.  Will cover with Ciprodex for the next 7 days, advised against Q-tip use.  Patient verbalized understanding, no questions at this time.  Return and follow-up precautions discussed.     Final Clinical Impressions(s) / UC Diagnoses   Final diagnoses:  Infective otitis externa of right ear  Cerumen in auditory canal on examination     Discharge Instructions      Today we irrigated your right ear in clinic to remove some wax buildup.  After the irrigation your external ear canal looked consistent with a ear  infection.  Please use the antibiotic drops twice daily for the next 7 days.  Please refrain from using Q-tips as these can push the wax further within your ears.  After your ear has healed, you can consider doing an over-the-counter wax softener like a Colace stool softener gel tablet that is broken up, you can insert the liquid into your ear.  You can also take a little bit of hand sanitizer and rub  externally to help dry up the wax.  Please follow-up with your primary care provider or return to clinic if you have no improvement over the next 7 days, or any worsening of symptoms.      ED Prescriptions     Medication Sig Dispense Auth. Provider   ciprofloxacin-dexamethasone (CIPRODEX) OTIC suspension Place 4 drops into the right ear 2 (two) times daily for 7 days. 7.5 mL Mileigh Tilley, [place] N, FNP      PDMP not reviewed this encounter.   Ruvim Risko, [place] N, FNP 05/07/22 1057    Kween Bacorn, [place] Ar[place], [place] 05/07/22 1059

## 2022-05-07 NOTE — ED Triage Notes (Signed)
Pt reports after getting out of shower this morning and was cleaning out ears with Q-tip, noticed blood that was in right ear. Denies pain.

## 2022-05-07 NOTE — Discharge Instructions (Addendum)
Today we irrigated your right ear in clinic to remove some wax buildup.  After the irrigation your external ear canal looked consistent with a ear infection.  Please use the antibiotic drops twice daily for the next 7 days.  Please refrain from using Q-tips as these can push the wax further within your ears.  After your ear has healed, you can consider doing an over-the-counter wax softener like a Colace stool softener gel tablet that is broken up, you can insert the liquid into your ear.  You can also take a little bit of hand sanitizer and rub externally to help dry up the wax.  Please follow-up with your primary care provider or return to clinic if you have no improvement over the next 7 days, or any worsening of symptoms.

## 2022-05-10 ENCOUNTER — Ambulatory Visit
Admit: 2022-05-10 | Discharge: 2022-05-10 | Disposition: A | Discharge status: Home / Self Care | Payer: 59 | Attending: Internal Medicine | Admitting: Internal Medicine

## 2022-05-10 DIAGNOSIS — Z1231 Encounter for screening mammogram for malignant neoplasm of breast: Secondary | ICD-10-CM | POA: Diagnosis not present

## 2022-05-12 NOTE — Progress Notes (Unsigned)
Subjective:    Patient ID: Kristina Ayala, female    DOB: May 04, 1945, 77 y.o.   MRN: [id number]     HPI Kristina Ayala is here for follow up of her chronic medical problems.  Urgent care 4/12 for ear pain - otitis externa and cerumen.  Prescribed ear drops.  She finishes them today.    Has right ovarian cyst - had [place] recently and it had grown by 2 cm.  Saw gyn - Dr Jackelyn Knife.  It is benign.  She also has urge incontinence.  Furuncle of buttock - has seen gyn  -   Saw her last month - palps and dizziness - lasted 1 hr in am after taking all meds - advised to switch lipitor and amlodipine to pm -- to monitor BP, HR.  Blood pressure has been okay.  She thinks that the potassium is making her not feel well and wondering if she should take this at a different time a day.    fecal smearing ( referred to GI) - did not feel an appt was necessary and will hold off for now  Medications and allergies reviewed with patient and updated if appropriate.  Current Outpatient Medications on File Prior to Visit  Medication Sig Dispense Refill   ALPRAZolam (XANAX) 0.5 MG tablet TAKE 1 TABLET(0.5 MG) BY MOUTH AT BEDTIME AS NEEDED FOR ANXIETY 30 tablet 0   amLODipine (NORVASC) 10 MG tablet      atorvastatin (LIPITOR) 40 MG tablet TAKE 1 TABLET(40 MG) BY MOUTH DAILY. FOLLOW-UP APPOINTMENT WITH LABS ARE DUE 90 tablet 0   Camphor-Eucalyptus-Menthol (VICKS VAPORUB EX) Apply 1 application topically as needed (congestion).     cholecalciferol (VITAMIN D) 25 MCG (1000 UNIT) tablet Take 2,000 Units by mouth daily.     Cyanocobalamin (VITAMIN B-12) 2500 MCG SUBL Place 2,500 mcg under the tongue daily.     denosumab (PROLIA) 60 MG/ML SOSY injection INJECT  INTO THE SKIN AS DIRECTED 1 mL 0   fluticasone (FLONASE) 50 MCG/ACT nasal spray Place 2 sprays into both nostrils daily. 16 g 6   gabapentin (NEURONTIN) 100 MG capsule Take 2 capsules (200 mg total) by mouth at bedtime. 180 capsule 0    hydrochlorothiazide (HYDRODIURIL) 25 MG tablet TAKE 1 TABLET(25 MG) BY MOUTH EVERY MORNING 90 tablet 3   ipratropium (ATROVENT) 0.06 % nasal spray Place 2 sprays into both nostrils 4 (four) times daily. As needed for nasal congestion, runny nose 15 mL 0   lactobacillus acidophilus & bulgar (LACTINEX) chewable tablet Chew 1 tablet by mouth 3 (three) times daily with meals. 21 tablet 0   lisinopril (ZESTRIL) 40 MG tablet TAKE 1 TABLET(40 MG) BY MOUTH DAILY 90 tablet 2   ondansetron (ZOFRAN) 4 MG tablet Take 1 tablet (4 mg total) by mouth every 6 (six) hours as needed for nausea or vomiting. 15 tablet 0   potassium chloride (KLOR-CON) 10 MEQ tablet Take 10 mEq by mouth daily.     TURMERIC PO Take 1 tablet by mouth daily.     No current facility-administered medications on file prior to visit.     Review of Systems  Constitutional:  Negative for fever.  HENT:  Negative for ear pain and hearing loss.   Respiratory:  Negative for shortness of breath.   Cardiovascular:  Negative for chest pain and palpitations.  Genitourinary:  Negative for dysuria, frequency and hematuria.  Neurological:  Negative for dizziness and headaches.       Objective:  Vitals:   05/13/22 0904  BP: 124/80  Pulse: 95  Temp: 98 F (36.7 C)  SpO2: 96%   BP Readings from Last 3 Encounters:  05/13/22 124/80  05/07/22 (!) 150/84  04/29/22 122/82   Wt Readings from Last 3 Encounters:  05/13/22 160 lb (72.6 kg)  04/29/22 165 lb (74.8 kg)  04/12/22 162 lb (73.5 kg)   Body mass index is 26.63 kg/m.    Physical Exam Constitutional:      General: She is not in acute distress.    Appearance: Normal appearance.  HENT:     Head: Normocephalic and atraumatic.     Right Ear: Tympanic membrane, ear canal and external ear normal. There is no impacted cerumen (minimal cerumen).     Left Ear: Tympanic membrane, ear canal and external ear normal. There is no impacted cerumen.  Eyes:     Conjunctiva/sclera:  Conjunctivae normal.  Cardiovascular:     Rate and Rhythm: Normal rate and regular rhythm.     Heart sounds: Normal heart sounds.  Pulmonary:     Effort: Pulmonary effort is normal. No respiratory distress.     Breath sounds: Normal breath sounds. No wheezing.  Musculoskeletal:     Cervical back: Neck supple.     Right lower leg: No edema.     Left lower leg: No edema.  Lymphadenopathy:     Cervical: No cervical adenopathy.  Skin:    General: Skin is warm and dry.     Findings: No rash.  Neurological:     Mental Status: She is alert. Mental status is at baseline.  Psychiatric:        Mood and Affect: Mood normal.        Behavior: Behavior normal.        Lab Results  Component Value Date   WBC 6.2 01/28/2022   HGB 13.6 01/28/2022   HCT 41.6 01/28/2022   PLT 315 01/28/2022   GLUCOSE 97 01/28/2022   CHOL 163 01/05/2022   TRIG 89.0 01/05/2022   HDL 59.30 01/05/2022   LDLDIRECT 213.1 05/22/2012   LDLCALC 86 01/05/2022   ALT 45 (H) 01/28/2022   AST 28 01/28/2022   NA 139 01/28/2022   K 3.8 01/28/2022   CL 101 01/28/2022   CREATININE 0.72 01/28/2022   BUN 11 01/28/2022   CO2 29 01/28/2022   TSH 0.64 01/05/2022   HGBA1C 6.4 01/05/2022   MICROALBUR <0.7 01/05/2022     Assessment & Plan:    See Problem List for Assessment and Plan of chronic medical problems.

## 2022-05-13 ENCOUNTER — Ambulatory Visit (INDEPENDENT_AMBULATORY_CARE_PROVIDER_SITE_OTHER): Payer: 59 | Admitting: Internal Medicine

## 2022-05-13 ENCOUNTER — Encounter: Payer: Self-pay | Admitting: Internal Medicine

## 2022-05-13 VITALS — BP 124/80 | HR 95 | Temp 98.0°F | Ht 65.0 in | Wt 160.0 lb

## 2022-05-13 DIAGNOSIS — N83201 Unspecified ovarian cyst, right side: Secondary | ICD-10-CM | POA: Diagnosis not present

## 2022-05-13 DIAGNOSIS — I10 Essential (primary) hypertension: Secondary | ICD-10-CM

## 2022-05-13 DIAGNOSIS — H6091 Unspecified otitis externa, right ear: Secondary | ICD-10-CM | POA: Insufficient documentation

## 2022-05-13 DIAGNOSIS — H60391 Other infective otitis externa, right ear: Secondary | ICD-10-CM | POA: Diagnosis not present

## 2022-05-13 DIAGNOSIS — N3941 Urge incontinence: Secondary | ICD-10-CM

## 2022-05-13 DIAGNOSIS — E876 Hypokalemia: Secondary | ICD-10-CM | POA: Diagnosis not present

## 2022-05-13 DIAGNOSIS — N9489 Other specified conditions associated with female genital organs and menstrual cycle: Secondary | ICD-10-CM

## 2022-05-13 NOTE — Assessment & Plan Note (Signed)
New States difficulty making it to the bathroom, especially in the middle of the night or first thing in the morning Has little difficulty during the day Discussed treatment options-this is not too concerning and she is not really interested in medication at this time Will refer to urogynecology

## 2022-05-13 NOTE — Assessment & Plan Note (Signed)
Chronic Known right ovarian cyst which is benign Recent ultrasound with GYN-Dr. Meisinger showed that this is 2 inches He advised monitoring only and no surgery since it is benign and given the risks of surgery and she is asymptomatic She is concerned about taking out and would like to get a second opinion-given bladder issues will refer to uro-GYN for second opinion

## 2022-05-13 NOTE — Assessment & Plan Note (Signed)
Treated successfully Today is her last day of eardrops and she will complete her doses today and then stop

## 2022-05-13 NOTE — Patient Instructions (Addendum)
        Medications changes include :   none    A referral was ordered for uro-gynecology.     Someone will call you to schedule an appointment.    Return for change June appt to early August.

## 2022-05-13 NOTE — Assessment & Plan Note (Signed)
Chronic Discussed that she does need to continue the potassium supplementation daily Will will move to different part of the day-advised to take with food Continue potassium chloride 10 mill equivalents daily

## 2022-05-13 NOTE — Assessment & Plan Note (Signed)
Chronic Blood pressure looks good here today Monitor blood pressure at home Continue amlodipine 10 mg at night Continue hydrochlorothiazide 25 mg, lisinopril 40 mg in the morning

## 2022-05-17 ENCOUNTER — Telehealth: Payer: Self-pay | Admitting: Family Medicine

## 2022-05-17 ENCOUNTER — Other Ambulatory Visit: Payer: Self-pay | Admitting: Family Medicine

## 2022-05-17 DIAGNOSIS — M5416 Radiculopathy, lumbar region: Secondary | ICD-10-CM

## 2022-05-17 NOTE — Telephone Encounter (Signed)
Pt states that her back is really bad, she cannot wait until her next appt and would like Dr. Katrinka Blazing to order another epidural.

## 2022-05-17 NOTE — Telephone Encounter (Signed)
Placed epidural order. Spoke with patient

## 2022-05-20 ENCOUNTER — Ambulatory Visit
Admission: RE | Admit: 2022-05-20 | Discharge: 2022-05-20 | Disposition: A | Discharge status: Home / Self Care | Payer: 59 | Source: Ambulatory Visit | Attending: Family Medicine | Admitting: Family Medicine

## 2022-05-20 DIAGNOSIS — M5416 Radiculopathy, lumbar region: Secondary | ICD-10-CM

## 2022-05-20 MED ORDER — IOPAMIDOL (ISOVUE-M 200) INJECTION 41%
1.0000 mL | Freq: Once | INTRAMUSCULAR | Status: AC
  Administered 2022-05-20: 1 mL via EPIDURAL

## 2022-05-20 MED ORDER — METHYLPREDNISOLONE ACETATE 40 MG/ML INJ SUSP (RADIOLOG
80.0000 mg | Freq: Once | INTRAMUSCULAR | Status: AC
  Administered 2022-05-20: 80 mg via EPIDURAL

## 2022-05-20 NOTE — Discharge Instructions (Signed)

## 2022-06-10 NOTE — Progress Notes (Signed)
[name] [address] [place] [address] Phone: [phone] Subjective:   INadine Counts, am serving as a scribe for Dr. Antoine Primas.  I'm seeing this patient by the request  of:  Pincus Sanes, MD  CC: Low back pain follow-up  [id number]  04/29/2022 Tendinitis noted with some longitudinal breakdown.  Discussed with patient about over-the-counter orthotics, home exercises and work with Event organiser, discussed icing regimen.  Topical anti-inflammatories can be beneficial as well.  Patient does have breakdown of the transverse arch that may be contributing and we did discuss which activities to do and which ones to avoid.  Discussed the importance of a metatarsal pad and that will be what patient over-the-counter orthotics.  Follow-up again in 6 to 8 weeks worsening pain consider injection and formal physical therapy.     Update 06/11/2022 Kristina Ayala is a 77 y.o. female coming in with complaint of R ankle and lower back pain. Epidural 05/20/2022. Patient states doing well. Wants to come back in about 4 weeks just in case.     Past Medical History:  Diagnosis Date   Arthritis    Bursitis    left elbow   Clostridium difficile infection    Diabetes mellitus without complication (HCC)    Frequent falls    HLD (hyperlipidemia)    Hypertension    Infectious colitis    Past Surgical History:  Procedure Laterality Date   CATARACT EXTRACTION Bilateral    COLONOSCOPY     ORIF PROXIMAL TIBIAL PLATEAU FRACTURE Left 02/19/2012   knee   POLYPECTOMY     TONSILLECTOMY     Social History   Socioeconomic History   Marital status: Divorced    Spouse name: Not on file   Number of children: 2   Years of education: Not on file   Highest education level: Associate degree: academic program  Occupational History   Occupation: RETIRED  Tobacco Use   Smoking status: Former    Packs/day: 0.25    Years: 50.00    Additional pack years:  0.00    Total pack years: 12.50    Types: Cigarettes    Quit date: 08/28/2014    Years since quitting: 7.7   Smokeless tobacco: Never   Tobacco comments:    1 pack every 3 days  Vaping Use   Vaping Use: Never used  Substance and Sexual Activity   Alcohol use: No    Alcohol/week: 0.0 standard drinks of alcohol   Drug use: No   Sexual activity: Not Currently  Other Topics Concern   Not on file  Social History Narrative   Lives alone    Right handed   Caffeine: 3x weekly         Social Determinants of Health   Financial Resource Strain: Low Risk  (02/20/2021)   Overall Financial Resource Strain (CARDIA)    Difficulty of Paying Living Expenses: Not hard at all  Food Insecurity: No Food Insecurity (02/20/2021)   Hunger Vital Sign    Worried About Running Out of Food in the Last Year: Never true    Ran Out of Food in the Last Year: Never true  Transportation Needs: No Transportation Needs (02/20/2021)   PRAPARE - Administrator, Civil Service (Medical): No    Lack of Transportation (Non-Medical): No  Physical Activity: Inactive (02/20/2021)   Exercise Vital Sign    Days of Exercise per Week: 0 days    Minutes  of Exercise per Session: 0 min  Stress: No Stress Concern Present (02/20/2021)   [employer] of Occupational Health - Occupational Stress Questionnaire    Feeling of Stress : Not at all  Social Connections: Moderately Integrated (02/20/2021)   Social Connection and Isolation Panel [NHANES]    Frequency of Communication with Friends and Family: More than three times a week    Frequency of Social Gatherings with Friends and Family: More than three times a week    Attends Religious Services: More than 4 times per year    Active Member of [employer] or Organizations: Yes    Attends Banker Meetings: More than 4 times per year    Marital Status: Widowed   Allergies  Allergen Reactions   Doxycycline Diarrhea   Minocycline Diarrhea   Wellbutrin  [Bupropion] Swelling    Per pt her tongue was swollen and sore/symptoms stopped once the medication was discontinued.    Penicillins Rash    Has patient had a PCN reaction causing immediate rash, facial/tongue/throat swelling, SOB or lightheadedness with hypotension: no Has patient had a PCN reaction causing severe rash involving mucus membranes or skin necrosis: no Has patient had a PCN reaction that required hospitalization: unknown Has patient had a PCN reaction occurring within the last 10 years: no If all of the above answers are "NO", then may proceed with Cephalosporin use.    Z-Pak [Azithromycin] Other (See Comments)    diarrhea   Family History  Problem Relation Age of Onset   Diverticulosis Mother    Ovarian cancer Mother    Pancreatic cancer Father    Lung cancer Father    Colon polyps Sister    Colon cancer Neg Hx    Breast cancer Neg Hx     Current Outpatient Medications (Endocrine & Metabolic):    denosumab (PROLIA) 60 MG/ML SOSY injection, INJECT 60MG  INTO THE SKIN AS DIRECTED  Current Outpatient Medications (Cardiovascular):    amLODipine (NORVASC) 10 MG tablet,    atorvastatin (LIPITOR) 40 MG tablet, TAKE 1 TABLET(40 MG) BY MOUTH DAILY. FOLLOW-UP APPOINTMENT WITH LABS ARE DUE   hydrochlorothiazide (HYDRODIURIL) 25 MG tablet, TAKE 1 TABLET(25 MG) BY MOUTH EVERY MORNING   lisinopril (ZESTRIL) 40 MG tablet, TAKE 1 TABLET(40 MG) BY MOUTH DAILY  Current Outpatient Medications (Respiratory):    Camphor-Eucalyptus-Menthol (VICKS VAPORUB EX), Apply 1 application topically as needed (congestion).   fluticasone (FLONASE) 50 MCG/ACT nasal spray, Place 2 sprays into both nostrils daily.   ipratropium (ATROVENT) 0.06 % nasal spray, Place 2 sprays into both nostrils 4 (four) times daily. As needed for nasal congestion, runny nose   Current Outpatient Medications (Hematological):    Cyanocobalamin (VITAMIN B-12) 2500 MCG SUBL, Place 2,500 mcg under the tongue  daily.  Current Outpatient Medications (Other):    ALPRAZolam (XANAX) 0.5 MG tablet, TAKE 1 TABLET(0.5 MG) BY MOUTH AT BEDTIME AS NEEDED FOR ANXIETY   cholecalciferol (VITAMIN D) 25 MCG (1000 UNIT) tablet, Take 2,000 Units by mouth daily.   gabapentin (NEURONTIN) 100 MG capsule, Take 2 capsules (200 mg total) by mouth at bedtime.   lactobacillus acidophilus & bulgar (LACTINEX) chewable tablet, Chew 1 tablet by mouth 3 (three) times daily with meals.   ondansetron (ZOFRAN) 4 MG tablet, Take 1 tablet (4 mg total) by mouth every 6 (six) hours as needed for nausea or vomiting.   potassium chloride (KLOR-CON) 10 MEQ tablet, Take 10 mEq by mouth daily.   TURMERIC PO, Take 1 tablet by mouth daily.  Objective  Blood pressure 128/70, pulse 93, height 5\' 5"  (1.651 m), weight 164 lb (74.4 kg), SpO2 98 %.   General: No apparent distress alert and oriented x3 mood and affect normal, dressed appropriately.  HEENT: Pupils equal, extraocular movements intact  Respiratory: Patient's speak in full sentences and does not appear short of breath  Cardiovascular: No lower extremity edema, non tender, no erythema  Patient is sitting comfortably at this time.  Negative straight leg test.  Still only has 5 degrees of extension of the back without some discomfort.  Patient is able to get out of a seated position without any significant difficulty.  4-5 strength of the hip abductors.    Impression and Recommendations:    The above documentation has been reviewed and is accurate and complete Judi Saa, DO

## 2022-06-11 ENCOUNTER — Ambulatory Visit (INDEPENDENT_AMBULATORY_CARE_PROVIDER_SITE_OTHER): Payer: 59 | Admitting: Family Medicine

## 2022-06-11 ENCOUNTER — Encounter: Payer: Self-pay | Admitting: Family Medicine

## 2022-06-11 VITALS — BP 128/70 | HR 93 | Ht 65.0 in | Wt 164.0 lb

## 2022-06-11 DIAGNOSIS — M5416 Radiculopathy, lumbar region: Secondary | ICD-10-CM | POA: Diagnosis not present

## 2022-06-11 NOTE — Patient Instructions (Signed)
Thanks for making my job easy See you again in 4 weeks just in case Get in that pool

## 2022-06-11 NOTE — Assessment & Plan Note (Signed)
Discussed home exercise  Discussed with activities to do and which ones to avoid.  Patient has not needed about every 3 months.  We discussed icing regimen and home exercises.  Increase activity slowly.  Follow-up with me again in 6 to 8 weeks

## 2022-07-06 ENCOUNTER — Ambulatory Visit (INDEPENDENT_AMBULATORY_CARE_PROVIDER_SITE_OTHER): Payer: 59

## 2022-07-06 ENCOUNTER — Ambulatory Visit (INDEPENDENT_AMBULATORY_CARE_PROVIDER_SITE_OTHER): Payer: 59 | Admitting: Family Medicine

## 2022-07-06 ENCOUNTER — Encounter: Payer: Self-pay | Admitting: Family Medicine

## 2022-07-06 ENCOUNTER — Other Ambulatory Visit: Payer: Self-pay

## 2022-07-06 VITALS — BP 132/86 | HR 87 | Ht 65.0 in | Wt 167.0 lb

## 2022-07-06 DIAGNOSIS — M25562 Pain in left knee: Secondary | ICD-10-CM | POA: Diagnosis not present

## 2022-07-06 NOTE — Patient Instructions (Addendum)
Thank you for coming in today.   Let me know if you want me to order physical therapy.  You received an injection today. Seek immediate medical attention if the joint becomes red, extremely painful, or is oozing fluid.   Check back in 6 weeks

## 2022-07-06 NOTE — Progress Notes (Signed)
I, Stevenson Clinch, CMA acting as a scribe for [name], MD.  Wilmer Floor Lemery is a 77 y.o. female who presents to [facility] today for L knee pain. Pt was last seen by Dr. Katrinka Blazing on 06/11/22 for lumbar radiculopathy.  Today, pt c/o L knee pain which started this past Saturday. Also notes falling on the knee yesterday while out getting the mail. She "twisted" her L knee while ballroom dancing. Pt locates pain to posterior aspect. Since fall, having pain throughout the knee joint. Has dx of RLS so has chronic B LE pain. Notes some worsening lower back pain s/p fall.  Mild scrapes and bruising present.   L Knee swelling: yes Mechanical symptoms: no Aggravates: WB, ambulation, knee flexion Treatments tried: ice, IBU  Pertinent review of systems: No fevers or chills  Relevant historical information: Left knee fracture with ORIF tibia   Exam:  BP 132/86   Pulse 87   Ht 5\' 5"  (1.651 m)   Wt 167 lb (75.8 kg)   SpO2 98%   BMI 27.79 kg/m  General: Well Developed, well nourished, and in no acute distress.   MSK: Left knee: Mild effusion.  Mature scar lateral knee.  Tender palpation medial and lateral joint line. Normal motion but palpable and audible clunk with valgus and varus stress test.  Intact strength. Laxity and clunking with ligament exam testing.     Lab and Radiology Results  Procedure: Real-time Ultrasound Guided Injection of left knee joint superior lateral patellar space Device: Philips Affiniti 50G Images permanently stored and available for review in PACS Verbal informed consent obtained.  Discussed risks and benefits of procedure. Warned about infection, bleeding, hyperglycemia damage to structures among others. Patient expresses understanding and agreement Time-out conducted.   Noted no overlying erythema, induration, or other signs of local infection.   Skin prepped in a sterile fashion.   Local anesthesia: Topical Ethyl  chloride.   With sterile technique and under real time ultrasound guidance: 40 mg of Kenalog and 2 mL of Marcaine injected into knee joint. Fluid seen entering the joint capsule.   Completed without difficulty   Pain immediately resolved suggesting accurate placement of the medication.   Advised to call if fevers/chills, erythema, induration, drainage, or persistent bleeding.   Images permanently stored and available for review in the ultrasound unit.  Impression: Technically successful ultrasound guided injection.   X-ray images left knee obtained today personally and independently interpreted. Well-appearing hardware at proximal tibia laterally.  No acute fractures are visible. Await formal radiology review    Assessment and Plan: 77 y.o. female with left knee pain.  Thought to be exacerbation of underlying DJD.  She has had a fall landed on her knee has some mechanical symptoms.  This is concerning for ligament or meniscus injury.  Hopefully she will improve with steroid injection if not better we may need advanced imaging.  She did have an MRI of her knee a few years ago but there is somewhat hard where the MRI was inconclusive.  If advanced imaging is needed CT arthrogram is probably the best option.     PDMP not reviewed this encounter. Orders Placed This Encounter  Procedures   [place] LIMITED JOINT SPACE STRUCTURES LOW LEFT(NO LINKED CHARGES)    Order Specific Question:   Reason for Exam (SYMPTOM  OR DIAGNOSIS REQUIRED)    Answer:   left knee pain    Order Specific Question:   Preferred imaging location?  Answer:   [facility] Knee Complete 4 Views Left    Standing Status:   Future    Number of Occurrences:   1    Standing Expiration Date:   07/06/2023    Order Specific Question:   Reason for Exam (SYMPTOM  OR DIAGNOSIS REQUIRED)    Answer:   fell eval possible fx    Order Specific Question:   Preferred imaging location?    Answer:   [facility]   No orders of the defined types were placed in this encounter.    Discussed warning signs or symptoms. Please see discharge instructions. Patient expresses understanding.   The above documentation has been reviewed and is accurate and complete [name], M.D.

## 2022-07-12 ENCOUNTER — Encounter: Payer: Self-pay | Admitting: Internal Medicine

## 2022-07-12 NOTE — Progress Notes (Signed)
[facility] [address] [place] [address] Phone: [phone] Subjective:    I'm seeing this patient by the request  of:  Pincus Sanes, MD  CC: low back pain follow up   [id number]  06/11/2022 Discussed home exercise  Discussed with activities to do and which ones to avoid.  Patient has not needed about every 3 months.  We discussed icing regimen and home exercises.  Increase activity slowly.  Follow-up with me again in 6 to 8 weeks      Update 07/19/2022 Kristina Ayala is a 77 y.o. female coming in with complaint of lumbar radiculopathy. Patient states that she will have her grand daughter for 8 weeks. Her low back hurts , she would like an epidural        Past Medical History:  Diagnosis Date   Arthritis    Bursitis    left elbow   Clostridium difficile infection    Diabetes mellitus without complication (HCC)    Frequent falls    HLD (hyperlipidemia)    Hypertension    Infectious colitis    Past Surgical History:  Procedure Laterality Date   CATARACT EXTRACTION Bilateral    COLONOSCOPY     ORIF PROXIMAL TIBIAL PLATEAU FRACTURE Left 02/19/2012   knee   POLYPECTOMY     TONSILLECTOMY     Social History   Socioeconomic History   Marital status: Divorced    Spouse name: Not on file   Number of children: 2   Years of education: Not on file   Highest education level: Some college, no degree  Occupational History   Occupation: RETIRED  Tobacco Use   Smoking status: Former    Packs/day: 0.25    Years: 50.00    Additional pack years: 0.00    Total pack years: 12.50    Types: Cigarettes    Quit date: 08/28/2014    Years since quitting: 7.8   Smokeless tobacco: Never   Tobacco comments:    1 pack every 3 days  Vaping Use   Vaping Use: Never used  Substance and Sexual Activity   Alcohol use: No    Alcohol/week: 0.0 standard drinks of alcohol   Drug use: No   Sexual activity: Not Currently  Other Topics  Concern   Not on file  Social History Narrative   Lives alone    Right handed   Caffeine: 3x weekly         Social Determinants of Health   Financial Resource Strain: Low Risk  (07/09/2022)   Overall Financial Resource Strain (CARDIA)    Difficulty of Paying Living Expenses: Not hard at all  Food Insecurity: No Food Insecurity (07/09/2022)   Hunger Vital Sign    Worried About Running Out of Food in the Last Year: Never true    Ran Out of Food in the Last Year: Never true  Transportation Needs: No Transportation Needs (07/09/2022)   PRAPARE - Administrator, Civil Service (Medical): No    Lack of Transportation (Non-Medical): No  Physical Activity: Unknown (07/09/2022)   Exercise Vital Sign    Days of Exercise per Week: 0 days    Minutes of Exercise per Session: Not on file  Stress: No Stress Concern Present (07/09/2022)   [employer] of Occupational Health - Occupational Stress Questionnaire    Feeling of Stress : Only a little  Social Connections: Moderately Integrated (07/09/2022)   Social Connection and Isolation Panel [NHANES]  Frequency of Communication with Friends and Family: More than three times a week    Frequency of Social Gatherings with Friends and Family: Three times a week    Attends Religious Services: More than 4 times per year    Active Member of Clubs or Organizations: Yes    Attends Banker Meetings: More than 4 times per year    Marital Status: Divorced   Allergies  Allergen Reactions   Doxycycline Diarrhea   Minocycline Diarrhea   Wellbutrin [Bupropion] Swelling    Per pt her tongue was swollen and sore/symptoms stopped once the medication was discontinued.    Penicillins Rash    Has patient had a PCN reaction causing immediate rash, facial/tongue/throat swelling, SOB or lightheadedness with hypotension: no Has patient had a PCN reaction causing severe rash involving mucus membranes or skin necrosis: no Has patient had a  PCN reaction that required hospitalization: unknown Has patient had a PCN reaction occurring within the last 10 years: no If all of the above answers are "NO", then may proceed with Cephalosporin use.    Z-Pak [Azithromycin] Other (See Comments)    diarrhea   Family History  Problem Relation Age of Onset   Diverticulosis Mother    Ovarian cancer Mother    Pancreatic cancer Father    Lung cancer Father    Colon polyps Sister    Colon cancer Neg Hx    Breast cancer Neg Hx     Current Outpatient Medications (Endocrine & Metabolic):    denosumab (PROLIA) 60 MG/ML SOSY injection, INJECT 60MG  INTO THE SKIN AS DIRECTED  Current Outpatient Medications (Cardiovascular):    amLODipine (NORVASC) 10 MG tablet,    atorvastatin (LIPITOR) 40 MG tablet, TAKE 1 TABLET(40 MG) BY MOUTH DAILY. FOLLOW-UP APPOINTMENT WITH LABS ARE DUE   hydrochlorothiazide (HYDRODIURIL) 25 MG tablet, TAKE 1 TABLET(25 MG) BY MOUTH EVERY MORNING   lisinopril (ZESTRIL) 40 MG tablet, TAKE 1 TABLET(40 MG) BY MOUTH DAILY  Current Outpatient Medications (Respiratory):    fluticasone (FLONASE) 50 MCG/ACT nasal spray, Place 2 sprays into both nostrils daily.   ipratropium (ATROVENT) 0.06 % nasal spray, Place 2 sprays into both nostrils 4 (four) times daily. As needed for nasal congestion, runny nose   Current Outpatient Medications (Hematological):    Cyanocobalamin (VITAMIN B-12) 2500 MCG SUBL, Place 2,500 mcg under the tongue daily.  Current Outpatient Medications (Other):    ALPRAZolam (XANAX) 0.5 MG tablet, TAKE 1 TABLET(0.5 MG) BY MOUTH AT BEDTIME AS NEEDED FOR ANXIETY   cholecalciferol (VITAMIN D) 25 MCG (1000 UNIT) tablet, Take 2,000 Units by mouth daily.   gabapentin (NEURONTIN) 100 MG capsule, Take 2 capsules (200 mg total) by mouth at bedtime as needed.   lactobacillus acidophilus & bulgar (LACTINEX) chewable tablet, Chew 1 tablet by mouth 3 (three) times daily with meals.   potassium chloride (KLOR-CON) 10 MEQ  tablet, Take 10 mEq by mouth daily.   TURMERIC PO, Take 1 tablet by mouth daily.   Reviewed prior external information including notes and imaging from  primary care provider As well as notes that were available from care everywhere and other healthcare systems.  Past medical history, social, surgical and family history all reviewed in electronic medical record.  No pertanent information unless stated regarding to the chief complaint.   Review of Systems:  No headache, visual changes, nausea, vomiting, diarrhea, constipation, dizziness, abdominal pain, skin rash, fevers, chills, night sweats, weight loss, swollen lymph nodes,, joint swelling, chest pain, shortness of  breath, mood changes. POSITIVE muscle aches, body aches  Objective  Blood pressure 130/84, pulse 80, height 5\' 5"  (1.651 m), weight 165 lb (74.8 kg), SpO2 99 %.   General: No apparent distress alert and oriented x3 mood and affect normal, dressed appropriately.  HEENT: Pupils equal, extraocular movements intact  Respiratory: Patient's speak in full sentences and does not appear short of breath  Cardiovascular: No lower extremity edema, non tender, no erythema  Of the low back does have significant loss of lordosis.  Tightness noted in the paraspinal musculature.  The patient does have limited flexion and extension noted. Patient is having an antalgic gait secondary to favoring the right side of the lower back.   Impression and Recommendations:    The above documentation has been reviewed and is accurate and complete Judi Saa, DO

## 2022-07-12 NOTE — Progress Notes (Signed)
Subjective:    Patient ID: Kristina Ayala, female    DOB: 03-03-1945, 77 y.o.   MRN: [id number]     HPI Kristina Ayala is here for follow up of her chronic medical problems.  She did fall since she was here last - when she was at dance class and then fell the next day - no injuries.    She feels good .  She is taking the potassium in the evening after eating and feels better.  Has some RLS but not nightly  Medications and allergies reviewed with patient and updated if appropriate.  Current Outpatient Medications on File Prior to Visit  Medication Sig Dispense Refill   ALPRAZolam (XANAX) 0.5 MG tablet TAKE 1 TABLET(0.5 MG) BY MOUTH AT BEDTIME AS NEEDED FOR ANXIETY 30 tablet 0   amLODipine (NORVASC) 10 MG tablet      atorvastatin (LIPITOR) 40 MG tablet TAKE 1 TABLET(40 MG) BY MOUTH DAILY. FOLLOW-UP APPOINTMENT WITH LABS ARE DUE 90 tablet 0   cholecalciferol (VITAMIN D) 25 MCG (1000 UNIT) tablet Take 2,000 Units by mouth daily.     Cyanocobalamin (VITAMIN B-12) 2500 MCG SUBL Place 2,500 mcg under the tongue daily.     denosumab (PROLIA) 60 MG/ML SOSY injection INJECT 60MG  INTO THE SKIN AS DIRECTED 1 mL 0   fluticasone (FLONASE) 50 MCG/ACT nasal spray Place 2 sprays into both nostrils daily. 16 g 6   gabapentin (NEURONTIN) 100 MG capsule Take 2 capsules (200 mg total) by mouth at bedtime. 180 capsule 0   hydrochlorothiazide (HYDRODIURIL) 25 MG tablet TAKE 1 TABLET(25 MG) BY MOUTH EVERY MORNING 90 tablet 3   ipratropium (ATROVENT) 0.06 % nasal spray Place 2 sprays into both nostrils 4 (four) times daily. As needed for nasal congestion, runny nose 15 mL 0   lactobacillus acidophilus & bulgar (LACTINEX) chewable tablet Chew 1 tablet by mouth 3 (three) times daily with meals. 21 tablet 0   lisinopril (ZESTRIL) 40 MG tablet TAKE 1 TABLET(40 MG) BY MOUTH DAILY 90 tablet 2   ondansetron (ZOFRAN) 4 MG tablet Take 1 tablet (4 mg total) by mouth every 6 (six) hours as needed for nausea or  vomiting. 15 tablet 0   potassium chloride (KLOR-CON) 10 MEQ tablet Take 10 mEq by mouth daily.     TURMERIC PO Take 1 tablet by mouth daily.     No current facility-administered medications on file prior to visit.     Review of Systems  Constitutional:  Negative for fever.  Respiratory:  Negative for cough, shortness of breath and wheezing.   Cardiovascular:  Negative for chest pain, palpitations and leg swelling.  Neurological:  Negative for light-headedness and headaches.       Objective:   Vitals:   07/13/22 0905  BP: 128/80  Pulse: 77  Temp: 98 F (36.7 C)  SpO2: 99%   BP Readings from Last 3 Encounters:  07/13/22 128/80  07/06/22 132/86  06/11/22 128/70   Wt Readings from Last 3 Encounters:  07/13/22 164 lb 12.8 oz (74.8 kg)  07/06/22 167 lb (75.8 kg)  06/11/22 164 lb (74.4 kg)   Body mass index is 27.42 kg/m.    Physical Exam Constitutional:      General: She is not in acute distress.    Appearance: Normal appearance.  HENT:     Head: Normocephalic and atraumatic.  Eyes:     Conjunctiva/sclera: Conjunctivae normal.  Cardiovascular:     Rate and Rhythm: Normal rate and  regular rhythm.     Heart sounds: Normal heart sounds.  Pulmonary:     Effort: Pulmonary effort is normal. No respiratory distress.     Breath sounds: Normal breath sounds. No wheezing.  Musculoskeletal:     Cervical back: Neck supple.     Right lower leg: No edema.     Left lower leg: No edema.  Lymphadenopathy:     Cervical: No cervical adenopathy.  Skin:    General: Skin is warm and dry.     Findings: No rash.  Neurological:     Mental Status: She is alert. Mental status is at baseline.  Psychiatric:        Mood and Affect: Mood normal.        Behavior: Behavior normal.        Lab Results  Component Value Date   WBC 6.2 01/28/2022   HGB 13.6 01/28/2022   HCT 41.6 01/28/2022   PLT 315 01/28/2022   GLUCOSE 97 01/28/2022   CHOL 163 01/05/2022   TRIG 89.0 01/05/2022    HDL 59.30 01/05/2022   LDLDIRECT 213.1 05/22/2012   LDLCALC 86 01/05/2022   ALT 45 (H) 01/28/2022   AST 28 01/28/2022   NA 139 01/28/2022   K 3.8 01/28/2022   CL 101 01/28/2022   CREATININE 0.72 01/28/2022   BUN 11 01/28/2022   CO2 29 01/28/2022   TSH 0.64 01/05/2022   HGBA1C 6.4 01/05/2022   MICROALBUR <0.7 01/05/2022     Assessment & Plan:    See Problem List for Assessment and Plan of chronic medical problems.

## 2022-07-12 NOTE — Progress Notes (Signed)
Left knee x-ray looks okay to radiology.  No fractures.  The hardware looks okay.

## 2022-07-12 NOTE — Patient Instructions (Addendum)
   An EKG was done.   Blood work was ordered.   The lab is on the first floor.    Medications changes include :       A referral was ordered and someone will call you to schedule an appointment.     Return in about 6 months (around 01/12/2023) for Physical Exam.

## 2022-07-13 ENCOUNTER — Ambulatory Visit (INDEPENDENT_AMBULATORY_CARE_PROVIDER_SITE_OTHER): Payer: 59 | Admitting: Internal Medicine

## 2022-07-13 VITALS — BP 128/80 | HR 77 | Temp 98.0°F | Ht 65.0 in | Wt 164.8 lb

## 2022-07-13 DIAGNOSIS — I10 Essential (primary) hypertension: Secondary | ICD-10-CM | POA: Diagnosis not present

## 2022-07-13 DIAGNOSIS — E782 Mixed hyperlipidemia: Secondary | ICD-10-CM | POA: Diagnosis not present

## 2022-07-13 DIAGNOSIS — I700 Atherosclerosis of aorta: Secondary | ICD-10-CM | POA: Diagnosis not present

## 2022-07-13 DIAGNOSIS — G2581 Restless legs syndrome: Secondary | ICD-10-CM | POA: Diagnosis not present

## 2022-07-13 DIAGNOSIS — E119 Type 2 diabetes mellitus without complications: Secondary | ICD-10-CM | POA: Diagnosis not present

## 2022-07-13 DIAGNOSIS — E876 Hypokalemia: Secondary | ICD-10-CM

## 2022-07-13 DIAGNOSIS — M810 Age-related osteoporosis without current pathological fracture: Secondary | ICD-10-CM | POA: Diagnosis not present

## 2022-07-13 DIAGNOSIS — I2510 Atherosclerotic heart disease of native coronary artery without angina pectoris: Secondary | ICD-10-CM

## 2022-07-13 DIAGNOSIS — F419 Anxiety disorder, unspecified: Secondary | ICD-10-CM

## 2022-07-13 LAB — COMPREHENSIVE METABOLIC PANEL
ALT: 24 U/L (ref 0–35)
AST: 21 U/L (ref 0–37)
Albumin: 4.4 g/dL (ref 3.5–5.2)
Alkaline Phosphatase: 46 U/L (ref 39–117)
BUN: 15 mg/dL (ref 6–23)
CO2: 30 mEq/L (ref 19–32)
Calcium: 9.7 mg/dL (ref 8.4–10.5)
Chloride: 101 mEq/L (ref 96–112)
Creatinine, Ser: 0.8 mg/dL (ref 0.40–1.20)
GFR: 70.62 mL/min (ref >60.00)
Glucose, Bld: 92 mg/dL (ref 70–99)
Potassium: 4.3 mEq/L (ref 3.5–5.1)
Sodium: 140 mEq/L (ref 135–145)
Total Bilirubin: 0.9 mg/dL (ref 0.2–1.2)
Total Protein: 7.8 g/dL (ref 6.0–8.3)

## 2022-07-13 LAB — CBC WITH DIFFERENTIAL/PLATELET
Basophils Absolute: 0.1 10*3/uL (ref 0.0–0.1)
Basophils Relative: 1.2 % (ref 0.0–3.0)
Eosinophils Absolute: 0.1 10*3/uL (ref 0.0–0.7)
Eosinophils Relative: 2.1 % (ref 0.0–5.0)
HCT: 41.9 % (ref 36.0–46.0)
Hemoglobin: 13.5 g/dL (ref 12.0–15.0)
Lymphocytes Relative: 35.4 % (ref 12.0–46.0)
Lymphs Abs: 2.5 10*3/uL (ref 0.7–4.0)
MCHC: 32.2 g/dL (ref 30.0–36.0)
MCV: 82.8 fl (ref 78.0–100.0)
Monocytes Absolute: 0.6 10*3/uL (ref 0.1–1.0)
Monocytes Relative: 8.5 % (ref 3.0–12.0)
Neutro Abs: 3.8 10*3/uL (ref 1.4–7.7)
Neutrophils Relative %: 52.8 % (ref 43.0–77.0)
Platelets: 268 10*3/uL (ref 150.0–400.0)
RBC: 5.06 Mil/uL (ref 3.87–5.11)
RDW: 15.9 % — ABNORMAL HIGH (ref 11.5–15.5)
WBC: 7.2 10*3/uL (ref 4.0–10.5)

## 2022-07-13 LAB — FERRITIN: Ferritin: 60.8 ng/mL (ref 10.0–291.0)

## 2022-07-13 LAB — LIPID PANEL
Cholesterol: 162 mg/dL (ref 0–200)
HDL: 50.6 mg/dL (ref >39.00)
LDL Cholesterol: 93 mg/dL (ref 0–99)
NonHDL: 110.98
Total CHOL/HDL Ratio: 3
Triglycerides: 88 mg/dL (ref 0.0–149.0)
VLDL: 17.6 mg/dL (ref 0.0–40.0)

## 2022-07-13 LAB — HEMOGLOBIN A1C: Hgb A1c MFr Bld: 6.1 % (ref 4.6–6.5)

## 2022-07-13 LAB — VITAMIN D 25 HYDROXY (VIT D DEFICIENCY, FRACTURES): VITD: 65.27 ng/mL (ref 30.00–100.00)

## 2022-07-13 MED ORDER — GABAPENTIN 100 MG PO CAPS: 200.0000 mg | ORAL_CAPSULE | Freq: Every evening | ORAL | 0 refills | Status: DC | PRN

## 2022-07-13 NOTE — Assessment & Plan Note (Signed)
Chronic cmp Continue potassium chloride 10 mEq daily

## 2022-07-13 NOTE — Assessment & Plan Note (Addendum)
Chronic Blood pressure looks good here today Monitor blood pressure at home Continue amlodipine 10 mg at night, hydrochlorothiazide 25 mg, lisinopril 40 mg in the morning  EKG: NSR with sinus arrhythmia at 75 bpm, possible left atrial enlargement.  Compared to previous EKGs from 2021 sinus arrhythmia is new, but no other changes.

## 2022-07-13 NOTE — Assessment & Plan Note (Signed)
Chronic Controlled, Stable Continue alprazolam 0.5 mg nightly as needed-does not take often 

## 2022-07-13 NOTE — Assessment & Plan Note (Signed)
Chronic Regular exercise and healthy diet encouraged Check lipid panel, CMP Continue atorvastatin 40 mg daily 

## 2022-07-13 NOTE — Assessment & Plan Note (Signed)
Chronic Coronary calcification seen on imaging Continue atorvastatin 40 mg daily

## 2022-07-13 NOTE — Assessment & Plan Note (Addendum)
Chronic Has intermittent at night Will check ferritin level Discussed that there is medication for restless leg, but at this time she does not feel like she needs it so we will hold off She sometimes takes iron but is not consistent with it-advised trying to be more consistent with it to see if that helps

## 2022-07-13 NOTE — Assessment & Plan Note (Signed)
Chronic Lab Results  Component Value Date   LDLCALC 86 01/05/2022   Currently on atorvastatin 40 mg qd Check lipids Stressed healthy diet, as much activity as tolerated

## 2022-07-13 NOTE — Assessment & Plan Note (Addendum)
Chronic On Prolia-continue every 6 months Last Prolia 04/15/2022 DEXA up-to-date Stressed regular exercise Continue vitamin D supplementation Not currently taking any calcium supplementation-advised taking some calcium and trying to eat a calcium rich diet Check vitamin D level

## 2022-07-13 NOTE — Assessment & Plan Note (Signed)
Chronic   Lab Results  Component Value Date   HGBA1C 6.4 01/05/2022   Sugars well controlled Check A1c Continue lifestyle control Stressed regular exercise, diabetic diet

## 2022-07-15 ENCOUNTER — Encounter: Payer: Self-pay | Admitting: Gastroenterology

## 2022-07-15 ENCOUNTER — Telehealth: Payer: Self-pay | Admitting: Family Medicine

## 2022-07-15 NOTE — Telephone Encounter (Signed)
Yes ok to order  

## 2022-07-15 NOTE — Telephone Encounter (Signed)
Patient called asking if we would be able to order another epidural for her? Advised her that Dr Katrinka Blazing was out of town and she asked if another doctor would be willing to order it for her so she can get it as soon as possible?  Please advise.

## 2022-07-19 ENCOUNTER — Other Ambulatory Visit: Payer: Self-pay

## 2022-07-19 ENCOUNTER — Ambulatory Visit (INDEPENDENT_AMBULATORY_CARE_PROVIDER_SITE_OTHER): Payer: 59 | Admitting: Family Medicine

## 2022-07-19 ENCOUNTER — Encounter: Payer: Self-pay | Admitting: Family Medicine

## 2022-07-19 ENCOUNTER — Other Ambulatory Visit: Payer: Self-pay | Admitting: Family Medicine

## 2022-07-19 VITALS — BP 130/84 | HR 80 | Ht 65.0 in | Wt 165.0 lb

## 2022-07-19 DIAGNOSIS — M5416 Radiculopathy, lumbar region: Secondary | ICD-10-CM

## 2022-07-19 DIAGNOSIS — M25579 Pain in unspecified ankle and joints of unspecified foot: Secondary | ICD-10-CM

## 2022-07-19 MED ORDER — METHYLPREDNISOLONE ACETATE 40 MG/ML IJ SUSP
40.0000 mg | Freq: Once | INTRAMUSCULAR | Status: AC
  Administered 2022-07-19: 40 mg via INTRAMUSCULAR

## 2022-07-19 MED ORDER — KETOROLAC TROMETHAMINE 30 MG/ML IJ SOLN
30.0000 mg | Freq: Once | INTRAMUSCULAR | Status: AC
  Administered 2022-07-19: 30 mg via INTRAMUSCULAR

## 2022-07-19 NOTE — Telephone Encounter (Signed)
Epidural was ordered

## 2022-07-19 NOTE — Patient Instructions (Signed)
8 week follow up  

## 2022-07-19 NOTE — Assessment & Plan Note (Signed)
Worsening lumbar radiculopathy.  Has responded extremely well to epidurals in the past.  Discussed icing regimen and home exercises.  Increase activity slowly.  Toradol and Depo-Medrol given today secondary to the exacerbation and will order the epidural again which patient has responded to.  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  Hopefully this will make significant improvement.

## 2022-07-21 ENCOUNTER — Ambulatory Visit: Payer: 59 | Admitting: Obstetrics and Gynecology

## 2022-07-30 ENCOUNTER — Ambulatory Visit
Admission: RE | Admit: 2022-07-30 | Discharge: 2022-07-30 | Disposition: A | Discharge status: Home / Self Care | Payer: 59 | Source: Ambulatory Visit | Attending: Family Medicine | Admitting: Family Medicine

## 2022-07-30 DIAGNOSIS — M5416 Radiculopathy, lumbar region: Secondary | ICD-10-CM

## 2022-07-30 DIAGNOSIS — M4727 Other spondylosis with radiculopathy, lumbosacral region: Secondary | ICD-10-CM | POA: Diagnosis not present

## 2022-07-30 MED ORDER — METHYLPREDNISOLONE ACETATE 40 MG/ML INJ SUSP (RADIOLOG
80.0000 mg | Freq: Once | INTRAMUSCULAR | Status: AC
  Administered 2022-07-30: 80 mg via EPIDURAL

## 2022-07-30 MED ORDER — IOPAMIDOL (ISOVUE-M 200) INJECTION 41%
1.0000 mL | Freq: Once | INTRAMUSCULAR | Status: AC
  Administered 2022-07-30: 1 mL via EPIDURAL

## 2022-07-30 NOTE — Discharge Instructions (Signed)

## 2022-08-06 ENCOUNTER — Other Ambulatory Visit (HOSPITAL_COMMUNITY)
Admission: RE | Admit: 2022-08-06 | Discharge: 2022-08-06 | Disposition: A | Discharge status: Home / Self Care | Payer: 59 | Source: Other Acute Inpatient Hospital | Attending: Obstetrics and Gynecology | Admitting: Obstetrics and Gynecology

## 2022-08-06 ENCOUNTER — Telehealth: Payer: Self-pay | Admitting: Internal Medicine

## 2022-08-06 ENCOUNTER — Encounter: Payer: Self-pay | Admitting: Obstetrics and Gynecology

## 2022-08-06 ENCOUNTER — Ambulatory Visit (INDEPENDENT_AMBULATORY_CARE_PROVIDER_SITE_OTHER): Payer: 59 | Admitting: Obstetrics and Gynecology

## 2022-08-06 VITALS — BP 133/79 | HR 79 | Ht 63.5 in | Wt 163.4 lb

## 2022-08-06 DIAGNOSIS — M6289 Other specified disorders of muscle: Secondary | ICD-10-CM | POA: Diagnosis not present

## 2022-08-06 DIAGNOSIS — R82998 Other abnormal findings in urine: Secondary | ICD-10-CM | POA: Diagnosis not present

## 2022-08-06 DIAGNOSIS — R151 Fecal smearing: Secondary | ICD-10-CM | POA: Diagnosis not present

## 2022-08-06 DIAGNOSIS — R350 Frequency of micturition: Secondary | ICD-10-CM

## 2022-08-06 DIAGNOSIS — N83201 Unspecified ovarian cyst, right side: Secondary | ICD-10-CM

## 2022-08-06 LAB — POCT URINALYSIS DIPSTICK
Bilirubin, UA: NEGATIVE
Blood, UA: NEGATIVE
Glucose, UA: NEGATIVE
Ketones, UA: NEGATIVE
Nitrite, UA: NEGATIVE
Protein, UA: NEGATIVE
Spec Grav, UA: 1.02 (ref 1.010–1.025)
Urobilinogen, UA: 0.2 E.U./dL
pH, UA: 7 (ref 5.0–8.0)

## 2022-08-06 NOTE — Telephone Encounter (Signed)
MD is ut of the office will forward msg to MD desktop to review when she return.Marland KitchenRaechel Chute

## 2022-08-06 NOTE — Progress Notes (Signed)
[facility] New Patient Evaluation and Consultation  Referring Provider: Pincus Sanes, MD PCP: Pincus Sanes, MD Date of Service: 08/06/2022  SUBJECTIVE Chief Complaint: New Patient (Initial Visit) (Kristina Ayala is a 77 y.o. female is here bowel incontinence/leakage.)  History of Present Illness: Kristina Ayala is a 77 y.o. Black or African-American female seen in consultation at the request of Dr. Lawerance Bach for evaluation of Fecal incontinence and UUI.    Review of records significant for: At the edge of the imaging field of the right adnexa is a 6.6 cm cyst, likely ovarian. This has relatively simple features on this limited evaluation and has been slowly increasing in size by CT going back to 2018. Please correlate with any prior workup. Recommend follow-up US in 6-12 months.  Urinary Symptoms: Does not leak urine.    Day time voids 2.  Nocturia: 1 times per night to void. Voiding dysfunction: she empties her bladder well.  does not use a catheter to empty bladder.  When urinating, she feels she has no difficulties   UTIs: 0 UTI's in the last year.   Denies history of blood in urine, kidney or bladder stones, pyelonephritis, bladder cancer, and kidney cancer  Pelvic Organ Prolapse Symptoms:                  She Denies a feeling of a bulge the vaginal area.   Bowel Symptom: Bowel movements: 2 time(s) per day Stool consistency: soft  Straining: no.  Splinting: no.  Incomplete evacuation: no.  She Admits to accidental bowel leakage / fecal incontinence  Occurs: When she gets stressed or anxious   Consistency with leakage: liquid Bowel regimen: none Last colonoscopy: Date 05/2013, Results No polyps  Sexual Function Sexually active: no.  Sexual orientation: Straight Pain with sex: No  Pelvic Pain Denies pelvic pain   Past Medical History:  Past Medical History:  Diagnosis Date   Arthritis    Bursitis    left elbow   Clostridium  difficile infection    Diabetes mellitus without complication (HCC)    Frequent falls    HLD (hyperlipidemia)    Hypertension    Infectious colitis      Past Surgical History:   Past Surgical History:  Procedure Laterality Date   CATARACT EXTRACTION Bilateral    COLONOSCOPY     ORIF PROXIMAL TIBIAL PLATEAU FRACTURE Left 02/19/2012   knee   POLYPECTOMY     TONSILLECTOMY       Past OB/GYN History: G6 P2 Vaginal deliveries: 2,  Forceps/ Vacuum deliveries: 0, Cesarean section: 0 Menopausal: Yes Last pap smear was 2022.  Any history of abnormal pap smears: no.   Medications: She has a current medication list which includes the following prescription(s): alprazolam, amlodipine, atorvastatin, cholecalciferol, vitamin b-12, prolia, fluticasone, gabapentin, hydrochlorothiazide, lisinopril, magnesium, potassium chloride, turmeric, ipratropium, and lactobacillus acidophilus & bulgar.   Allergies: Patient is allergic to doxycycline, minocycline, wellbutrin [bupropion], penicillins, and z-pak [azithromycin].   Social History:  Social History   Tobacco Use   Smoking status: Former    Current packs/day: 0.00    Average packs/day: 0.3 packs/day for 50.0 years (12.5 ttl pk-yrs)    Types: Cigarettes    Start date: 08/27/1964    Quit date: 08/28/2014    Years since quitting: 7.9   Smokeless tobacco: Never   Tobacco comments:    1 pack every 3 days  Vaping Use   Vaping status: Never Used  Substance Use Topics  Alcohol use: No    Alcohol/week: 0.0 standard drinks of alcohol   Drug use: No    Relationship status: single She lives alone.   She is employed as a [employer] manager. Regular exercise: No History of abuse: No  Family History:   Family History  Problem Relation Age of Onset   Diverticulosis Mother    Ovarian cancer Mother    Pancreatic cancer Father    Lung cancer Father    Colon polyps Sister    Colon cancer Neg Hx    Breast cancer Neg Hx      Review of  Systems: Review of Systems  Constitutional:  Positive for malaise/fatigue. Negative for chills and fever.  Respiratory:  Positive for shortness of breath. Negative for cough and wheezing.   Cardiovascular:  Negative for chest pain, palpitations and leg swelling.  Gastrointestinal:  Negative for abdominal pain, blood in stool and constipation.       +Fecal smearing  Genitourinary:  Negative for dysuria, hematuria and urgency.  Neurological:  Positive for weakness. Negative for dizziness and headaches.  Endo/Heme/Allergies:  Bruises/bleeds easily.  Psychiatric/Behavioral:  Negative for depression and suicidal ideas. The patient is nervous/anxious.      OBJECTIVE Physical Exam: Vitals:   08/06/22 1013  BP: 133/79  Pulse: 79  Weight: 163 lb 6.4 oz (74.1 kg)  Height: 5' 3.5" (1.613 m)    Physical Exam Constitutional:      Appearance: Normal appearance.  Pulmonary:     Effort: Pulmonary effort is normal.  Skin:    General: Skin is warm and dry.  Neurological:     Mental Status: She is alert and oriented to person, place, and time.  Psychiatric:        Mood and Affect: Mood normal.        Behavior: Behavior normal.        Thought Content: Thought content normal.      GU / Detailed Urogynecologic Evaluation:  Pelvic Exam: Normal external female genitalia; Bartholin's and Skene's glands normal in appearance; urethral meatus normal in appearance, no urethral masses or discharge.   CST: negative  Speculum exam reveals normal vaginal mucosa with atrophy. Cervix normal appearance. Uterus normal single, nontender. Adnexa normal adnexa.    With apex supported, anterior compartment defect was reduced  Pelvic floor strength I/V  Pelvic floor musculature: Right levator non-tender, Right obturator non-tender, Left levator non-tender, Left obturator non-tender  POP-Q:   POP-Q  -3                                            Aa   -3                                           Ba   -6                                              C   3  Gh  3                                            Pb  8                                            tvl   -3                                            Ap  -3                                            Bp  -6.5                                              D      Rectal Exam:  Normal sphincter tone, no distal rectocele, enterocoele not present, no rectal masses, noted dyssynergia when asking the patient to bear down.  Post-Void Residual (PVR) by Bladder Scan: In order to evaluate bladder emptying, we discussed obtaining a postvoid residual and she agreed to this procedure.  Procedure: The ultrasound unit was placed on the patient's abdomen in the suprapubic region after the patient had voided. A PVR of 13 ml was obtained by bladder scan.  Laboratory Results:  POC Urine: Positive for small leukocytes.  ASSESSMENT AND PLAN Ms. Panagopoulos is a 77 y.o. with:  1. Fecal smearing   2. Urinary frequency   3. Leukocytes in urine   4. Pelvic floor dysfunction in female   5. Right ovarian cyst    For patient's FI we discussed taking the imodium as she needs it, but also the fact that she has poor muscle control of the anal sphincter. She did not have a good ability to squeeze around my finger during rectal exam. We discussed that pelvic floor PT would be helpful for her in gaining more control over her core muscles as well as decreasing the loss of loose stool. PT referral placed.  Briefly discussed SNM but as patient does not wish to pursue any type of surgery this was not heavily discussed.  Urgency is not bothersome to patient but she may still get some benefit from this with physical therapy.   POC urine positive for small leukocytes. Will send for culture. Patient reports she is keeping her grandchild for the summer, so if she were to do pelvic floor PT it would be after school start  back.  We briefly discussed her ovarian cyst situation, as we do not do single ovarian removal as part of the surgical options here. We discussed that Dr. Florian Buff has done ovary removal in tandem with hysterectomy and prolapse repairs. She stated that the ovary cyst is not causing her pain, nor is it impacting her quality of life. We discussed doing things that will assist in her quality of life. Dr. Lawerance Bach office was called and made aware  that patient may need to see a Adult nurse or different OBGYN surgeon should she want a formal second opinion regarding her ovarian cyst.   Patient to return PRN    Selmer Dominion, NP

## 2022-08-06 NOTE — Patient Instructions (Addendum)
Continue to take the imodium as you need to for the loose bowel habits   Consider starting pelvic floor PT for your fecal leakage. You do not have great control of your rectal sphincter or your pelvic floor muscles.   You may want to see another provider regarding a second opinion about the removal of ovarian cyst/ovaries.

## 2022-08-06 NOTE — Telephone Encounter (Signed)
Nurse Practitioner Job Founds called to inform us she saw the patient but due to patient just wanting a second opinion for her ovarian cyst and their office does not deal with those. She states she doesn't feel the patient got what she was looking for and feels patient may need another referral sent to another OBGYN or to Dr.Ajewole.

## 2022-08-07 LAB — URINE CULTURE: Culture: NO GROWTH

## 2022-08-22 NOTE — Telephone Encounter (Signed)
She is currently following with gyn

## 2022-08-26 ENCOUNTER — Ambulatory Visit (INDEPENDENT_AMBULATORY_CARE_PROVIDER_SITE_OTHER): Payer: 59

## 2022-08-26 VITALS — Ht 63.5 in | Wt 165.0 lb

## 2022-08-26 DIAGNOSIS — M810 Age-related osteoporosis without current pathological fracture: Secondary | ICD-10-CM | POA: Diagnosis not present

## 2022-08-26 DIAGNOSIS — R269 Unspecified abnormalities of gait and mobility: Secondary | ICD-10-CM

## 2022-08-26 DIAGNOSIS — Z0000 Encounter for general adult medical examination without abnormal findings: Secondary | ICD-10-CM

## 2022-08-26 NOTE — Patient Instructions (Addendum)
Kristina Ayala , Thank you for taking time to come for your Medicare Wellness Visit. I appreciate your ongoing commitment to your health goals. Please review the following plan we discussed and let me know if I can assist you in the future.   Referrals/Orders/Follow-Ups/Clinician Recommendations: Yes  This is a list of the screening recommended for you and due dates:  Health Maintenance  Topic Date Due   Complete foot exam   Never done   Eye exam for diabetics  09/03/2021   COVID-19 Vaccine (5 - 2023-24 season) 09/25/2021   Flu Shot  08/26/2022   DEXA scan (bone density measurement)  09/02/2022   Yearly kidney health urinalysis for diabetes  01/06/2023   Hemoglobin A1C  01/12/2023   Yearly kidney function blood test for diabetes  07/13/2023   Medicare Annual Wellness Visit  08/26/2023   DTaP/Tdap/Td vaccine (2 - Td or Tdap) 07/28/2024   Pneumonia Vaccine  Completed   Hepatitis C Screening  Completed   HPV Vaccine  Aged Out   Colon Cancer Screening  Discontinued   Zoster (Shingles) Vaccine  Discontinued    Advanced directives: (Declined) Advance directive discussed with you today. Even though you declined this today, please call our office should you change your mind, and we can give you the proper paperwork for you to fill out.  Next Medicare Annual Wellness Visit scheduled for next year: Yes; 08/29/2023 at 8:45 a.m. telephone visit with Nurse Percell Miller.  Preventive Care 78 Years and Older, Female Preventive care refers to lifestyle choices and visits with your health care provider that can promote health and wellness. What does preventive care include? A yearly physical exam. This is also called an annual well check. Dental exams once or twice a year. Routine eye exams. Ask your health care provider how often you should have your eyes checked. Personal lifestyle choices, including: Daily care of your teeth and gums. Regular physical activity. Eating a healthy diet. Avoiding tobacco  and drug use. Limiting alcohol use. Practicing safe sex. Taking low-dose aspirin every day. Taking vitamin and mineral supplements as recommended by your health care provider. What happens during an annual well check? The services and screenings done by your health care provider during your annual well check will depend on your age, overall health, lifestyle risk factors, and family history of disease. Counseling  Your health care provider may ask you questions about your: Alcohol use. Tobacco use. Drug use. Emotional well-being. Home and relationship well-being. Sexual activity. Eating habits. History of falls. Memory and ability to understand (cognition). Work and work Astronomer. Reproductive health. Screening  You may have the following tests or measurements: Height, weight, and BMI. Blood pressure. Lipid and cholesterol levels. These may be checked every 5 years, or more frequently if you are over 17 years old. Skin check. Lung cancer screening. You may have this screening every year starting at age 62 if you have a 30-pack-year history of smoking and currently smoke or have quit within the past 15 years. Fecal occult blood test (FOBT) of the stool. You may have this test every year starting at age 73. Flexible sigmoidoscopy or colonoscopy. You may have a sigmoidoscopy every 5 years or a colonoscopy every 10 years starting at age 65. Hepatitis C blood test. Hepatitis B blood test. Sexually transmitted disease (STD) testing. Diabetes screening. This is done by checking your blood sugar (glucose) after you have not eaten for a while (fasting). You may have this done every 1-3 years. Bone density scan.  This is done to screen for osteoporosis. You may have this done starting at age 8. Mammogram. This may be done every 1-2 years. Talk to your health care provider about how often you should have regular mammograms. Talk with your health care provider about your test results,  treatment options, and if necessary, the need for more tests. Vaccines  Your health care provider may recommend certain vaccines, such as: Influenza vaccine. This is recommended every year. Tetanus, diphtheria, and acellular pertussis (Tdap, Td) vaccine. You may need a Td booster every 10 years. Zoster vaccine. You may need this after age 62. Pneumococcal 13-valent conjugate (PCV13) vaccine. One dose is recommended after age 59. Pneumococcal polysaccharide (PPSV23) vaccine. One dose is recommended after age 73. Talk to your health care provider about which screenings and vaccines you need and how often you need them. This information is not intended to replace advice given to you by your health care provider. Make sure you discuss any questions you have with your health care provider. Document Released: 02/07/2015 Document Revised: 10/01/2015 Document Reviewed: 11/12/2014 Elsevier Interactive Patient Education  2017 [employer].  Fall Prevention in the Home Falls can cause injuries. They can happen to people of all ages. There are many things you can do to make your home safe and to help prevent falls. What can I do on the outside of my home? Regularly fix the edges of walkways and driveways and fix any cracks. Remove anything that might make you trip as you walk through a door, such as a raised step or threshold. Trim any bushes or trees on the path to your home. Use bright outdoor lighting. Clear any walking paths of anything that might make someone trip, such as rocks or tools. Regularly check to see if handrails are loose or broken. Make sure that both sides of any steps have handrails. Any raised decks and porches should have guardrails on the edges. Have any leaves, snow, or ice cleared regularly. Use sand or salt on walking paths during winter. Clean up any spills in your garage right away. This includes oil or grease spills. What can I do in the bathroom? Use night  lights. Install grab bars by the toilet and in the tub and shower. Do not use towel bars as grab bars. Use non-skid mats or decals in the tub or shower. If you need to sit down in the shower, use a plastic, non-slip stool. Keep the floor dry. Clean up any water that spills on the floor as soon as it happens. Remove soap buildup in the tub or shower regularly. Attach bath mats securely with double-sided non-slip rug tape. Do not have throw rugs and other things on the floor that can make you trip. What can I do in the bedroom? Use night lights. Make sure that you have a light by your bed that is easy to reach. Do not use any sheets or blankets that are too big for your bed. They should not hang down onto the floor. Have a firm chair that has side arms. You can use this for support while you get dressed. Do not have throw rugs and other things on the floor that can make you trip. What can I do in the kitchen? Clean up any spills right away. Avoid walking on wet floors. Keep items that you use a lot in easy-to-reach places. If you need to reach something above you, use a strong step stool that has a grab bar. Keep electrical cords  out of the way. Do not use floor polish or wax that makes floors slippery. If you must use wax, use non-skid floor wax. Do not have throw rugs and other things on the floor that can make you trip. What can I do with my stairs? Do not leave any items on the stairs. Make sure that there are handrails on both sides of the stairs and use them. Fix handrails that are broken or loose. Make sure that handrails are as long as the stairways. Check any carpeting to make sure that it is firmly attached to the stairs. Fix any carpet that is loose or worn. Avoid having throw rugs at the top or bottom of the stairs. If you do have throw rugs, attach them to the floor with carpet tape. Make sure that you have a light switch at the top of the stairs and the bottom of the stairs. If  you do not have them, ask someone to add them for you. What else can I do to help prevent falls? Wear shoes that: Do not have high heels. Have rubber bottoms. Are comfortable and fit you well. Are closed at the toe. Do not wear sandals. If you use a stepladder: Make sure that it is fully opened. Do not climb a closed stepladder. Make sure that both sides of the stepladder are locked into place. Ask someone to hold it for you, if possible. Clearly mark and make sure that you can see: Any grab bars or handrails. First and last steps. Where the edge of each step is. Use tools that help you move around (mobility aids) if they are needed. These include: Canes. Walkers. Scooters. Crutches. Turn on the lights when you go into a dark area. Replace any light bulbs as soon as they burn out. Set up your furniture so you have a clear path. Avoid moving your furniture around. If any of your floors are uneven, fix them. If there are any pets around you, be aware of where they are. Review your medicines with your doctor. Some medicines can make you feel dizzy. This can increase your chance of falling. Ask your doctor what other things that you can do to help prevent falls. This information is not intended to replace advice given to you by your health care provider. Make sure you discuss any questions you have with your health care provider. Document Released: 11/07/2008 Document Revised: 06/19/2015 Document Reviewed: 02/15/2014 Elsevier Interactive Patient Education  2017 [employer].

## 2022-08-26 NOTE — Progress Notes (Signed)
Subjective:   Kristina Ayala is a 77 y.o. female who presents for Medicare Annual (Subsequent) preventive examination.  Visit Complete: Virtual  I connected with  Kristina Ayala on 08/26/22 by a audio enabled telemedicine application and verified that I am speaking with the correct person using two identifiers.  Patient Location: Home  Provider Location: Office/Clinic  I discussed the limitations of evaluation and management by telemedicine. The patient expressed understanding and agreed to proceed.  Vital Signs: Unable to obtain new vitals due to this being a telehealth visit.   Review of Systems     Cardiac Risk Factors include: advanced age (>86men, >46 women);dyslipidemia;hypertension;sedentary lifestyle     Objective:    Today's Vitals   08/26/22 0849  Weight: 165 lb (74.8 kg)  Height: 5' 3.5" (1.613 m)  PainSc: 0-No pain   Body mass index is 28.77 kg/m.     08/26/2022    8:51 AM 01/21/2022   10:25 AM 02/20/2021    2:30 PM 07/13/2020    7:17 AM 04/10/2020    9:47 AM 01/11/2020    9:38 AM 12/14/2019   11:12 AM  Advanced Directives  Does Patient Have a Medical Advance Directive? No No No No No No No  Would patient like information on creating a medical advance directive? No - Patient declined No - Patient declined No - Patient declined  Yes (MAU/Ambulatory/Procedural Areas - Information given) No - Patient declined No - Patient declined    Current Medications (verified) Outpatient Encounter Medications as of 08/26/2022  Medication Sig   ALPRAZolam (XANAX) 0.5 MG tablet TAKE 1 TABLET(0.5 MG) BY MOUTH AT BEDTIME AS NEEDED FOR ANXIETY   amLODipine (NORVASC) 10 MG tablet    atorvastatin (LIPITOR) 40 MG tablet TAKE 1 TABLET(40 MG) BY MOUTH DAILY. FOLLOW-UP APPOINTMENT WITH LABS ARE DUE   cholecalciferol (VITAMIN D) 25 MCG (1000 UNIT) tablet Take 2,000 Units by mouth daily.   Cyanocobalamin (VITAMIN B-12) 2500 MCG SUBL Place 2,500 mcg under the tongue daily.    denosumab (PROLIA) 60 MG/ML SOSY injection INJECT 60MG  INTO THE SKIN AS DIRECTED   fluticasone (FLONASE) 50 MCG/ACT nasal spray Place 2 sprays into both nostrils daily.   gabapentin (NEURONTIN) 100 MG capsule Take 2 capsules (200 mg total) by mouth at bedtime as needed.   hydrochlorothiazide (HYDRODIURIL) 25 MG tablet TAKE 1 TABLET(25 MG) BY MOUTH EVERY MORNING   ipratropium (ATROVENT) 0.06 % nasal spray Place 2 sprays into both nostrils 4 (four) times daily. As needed for nasal congestion, runny nose   lactobacillus acidophilus & bulgar (LACTINEX) chewable tablet Chew 1 tablet by mouth 3 (three) times daily with meals. (Patient not taking: Reported on 08/06/2022)   lisinopril (ZESTRIL) 40 MG tablet TAKE 1 TABLET(40 MG) BY MOUTH DAILY   MAGNESIUM PO Take by mouth.   potassium chloride (KLOR-CON) 10 MEQ tablet Take 10 mEq by mouth daily.   TURMERIC PO Take 1 tablet by mouth daily.   No facility-administered encounter medications on file as of 08/26/2022.    Allergies (verified) Doxycycline, Minocycline, Wellbutrin [bupropion], Penicillins, and Z-pak [azithromycin]   History: Past Medical History:  Diagnosis Date   Arthritis    Bursitis    left elbow   Clostridium difficile infection    Diabetes mellitus without complication (HCC)    Frequent falls    HLD (hyperlipidemia)    Hypertension    Infectious colitis    Past Surgical History:  Procedure Laterality Date   CATARACT EXTRACTION Bilateral  COLONOSCOPY     ORIF PROXIMAL TIBIAL PLATEAU FRACTURE Left 02/19/2012   knee   POLYPECTOMY     TONSILLECTOMY     Family History  Problem Relation Age of Onset   Diverticulosis Mother    Ovarian cancer Mother    Pancreatic cancer Father    Lung cancer Father    Colon polyps Sister    Colon cancer Neg Hx    Breast cancer Neg Hx    Social History   Socioeconomic History   Marital status: Divorced    Spouse name: Not on file   Number of children: 2   Years of education: Not on  file   Highest education level: Some college, no degree  Occupational History   Occupation: RETIRED  Tobacco Use   Smoking status: Former    Current packs/day: 0.00    Average packs/day: 0.3 packs/day for 50.0 years (12.5 ttl pk-yrs)    Types: Cigarettes    Start date: 08/27/1964    Quit date: 08/28/2014    Years since quitting: 8.0   Smokeless tobacco: Never   Tobacco comments:    1 pack every 3 days  Vaping Use   Vaping status: Never Used  Substance and Sexual Activity   Alcohol use: No    Alcohol/week: 0.0 standard drinks of alcohol   Drug use: No   Sexual activity: Not Currently  Other Topics Concern   Not on file  Social History Narrative   Lives alone    Right handed   Caffeine: 3x weekly         Social Determinants of Health   Financial Resource Strain: Low Risk  (08/26/2022)   Overall Financial Resource Strain (CARDIA)    Difficulty of Paying Living Expenses: Not hard at all  Food Insecurity: No Food Insecurity (08/26/2022)   Hunger Vital Sign    Worried About Running Out of Food in the Last Year: Never true    Ran Out of Food in the Last Year: Never true  Transportation Needs: No Transportation Needs (08/26/2022)   PRAPARE - Administrator, Civil Service (Medical): No    Lack of Transportation (Non-Medical): No  Physical Activity: Inactive (08/26/2022)   Exercise Vital Sign    Days of Exercise per Week: 0 days    Minutes of Exercise per Session: 0 min  Stress: No Stress Concern Present (08/26/2022)   [employer] of Occupational Health - Occupational Stress Questionnaire    Feeling of Stress : Only a little  Social Connections: Moderately Integrated (08/26/2022)   Social Connection and Isolation Panel [NHANES]    Frequency of Communication with Friends and Family: More than three times a week    Frequency of Social Gatherings with Friends and Family: Three times a week    Attends Religious Services: More than 4 times per year    Active Member of Clubs  or Organizations: Yes    Attends Engineer, structural: More than 4 times per year    Marital Status: Divorced    Tobacco Counseling Counseling given: Not Answered Tobacco comments: 1 pack every 3 days   Clinical Intake:  Pre-visit preparation completed: Yes  Pain : No/denies pain Pain Score: 0-No pain     BMI - recorded: 28.77 Nutritional Status: BMI 25 -29 Overweight Nutritional Risks: None Diabetes: No  How often do you need to have someone help you when you read instructions, pamphlets, or other written materials from your doctor or pharmacy?: 1 - Never What  is the last grade level you completed in school?: 2 years of college  Interpreter Needed?: No  Information entered by :: Hilding Quintanar N. Domonic Kimball, LPN.   Activities of Daily Living    08/26/2022    8:56 AM  In your present state of health, do you have any difficulty performing the following activities:  Hearing? 0  Vision? 0  Difficulty concentrating or making decisions? 0  Walking or climbing stairs? 0  Dressing or bathing? 0  Doing errands, shopping? 0  Preparing Food and eating ? N  Using the Toilet? N  In the past six months, have you accidently leaked urine? N  Do you have problems with loss of bowel control? N  Managing your Medications? N  Managing your Finances? N  Housekeeping or managing your Housekeeping? N    Patient Care Team: Pincus Sanes, MD as PCP - General (Internal Medicine) Jodelle Red, MD as PCP - Cardiology (Cardiology) Judi Saa, DO as Attending Physician (Family Medicine) [facility], [facility] as Consulting Physician (Ophthalmology)  Indicate any recent Medical Services you may have received from other than [facility] providers in the past year (date may be approximate).     Assessment:   This is a routine wellness examination for Malgorzata.  Hearing/Vision screen Hearing Screening - Comments:: Denies hearing difficulties; no hearing  aids.  Vision Screening - Comments:: Wears rx glasses for reading and distance - up to date with routine eye exams with [facility]   Dietary issues and exercise activities discussed:     Goals Addressed   None   Depression Screen    08/26/2022    8:54 AM 07/13/2022    9:33 AM 05/13/2022    9:15 AM 04/12/2022    9:22 AM 02/04/2022    9:23 AM 01/05/2022    9:11 AM 04/03/2021   11:00 AM  PHQ 2/9 Scores  PHQ - 2 Score 0 0 0 0 1 1 2   PHQ- 9 Score 0  2 2 3 3 4     Fall Risk    07/13/2022    9:32 AM 05/13/2022    9:15 AM 04/12/2022    9:21 AM 02/04/2022    9:23 AM 01/05/2022    9:11 AM  Fall Risk   Falls in the past year? 1 0 1 0 0  Number falls in past yr: 0 0 1 0 0  Injury with Fall? 0 0 0 0 0  Risk for fall due to : No Fall Risks No Fall Risks No Fall Risks No Fall Risks No Fall Risks  Follow up Falls evaluation completed Falls evaluation completed Falls evaluation completed Falls evaluation completed Falls evaluation completed    MEDICARE RISK AT HOME:  Medicare Risk at Home - 08/26/22 0851     Any stairs in or around the home? Yes    If so, are there any without handrails? Yes    Home free of loose throw rugs in walkways, pet beds, electrical cords, etc? Yes    Adequate lighting in your home to reduce risk of falls? Yes    Life alert? Yes   not wearing it   Use of a cane, walker or w/c? Yes   as needed   Grab bars in the bathroom? Yes    Shower chair or bench in shower? No    Elevated toilet seat or a handicapped toilet? Yes             TIMED UP AND GO:  Was  the test performed?  No    Cognitive Function:        08/26/2022    8:57 AM 01/10/2019    1:29 PM  6CIT Screen  What Year? 0 points 0 points  What month? 0 points 0 points  What time? 0 points 0 points  Count back from 20 0 points 0 points  Months in reverse 0 points 0 points  Repeat phrase 0 points 0 points  Total Score 0 points 0 points    Immunizations Immunization History   Administered Date(s) Administered   Fluad Quad(high Dose 65+) 10/18/2018, 10/07/2020, 10/01/2021   Influenza, High Dose Seasonal PF 11/10/2016, 10/28/2017, 12/28/2019   Influenza,inj,Quad PF,6+ Mos 11/12/2013, 11/27/2014   Influenza-Unspecified 11/26/2015   PFIZER(Purple Top)SARS-COV-2 Vaccination 03/19/2019, 04/09/2019, 11/19/2019   Pfizer Covid-19 Vaccine Bivalent Booster 31yrs & up 05/08/2021   Pneumococcal Conjugate-13 04/30/2015   Pneumococcal Polysaccharide-23 01/04/2014   Tdap 07/29/2014   Zoster Recombinant(Shingrix) 02/20/2019, 09/13/2019    TDAP status: Up to date  Flu Vaccine status: Up to date  Pneumococcal vaccine status: Up to date  Covid-19 vaccine status: Completed vaccines  Qualifies for Shingles Vaccine? Yes   Zostavax completed No   Shingrix Completed?: Yes  Screening Tests Health Maintenance  Topic Date Due   FOOT EXAM  Never done   OPHTHALMOLOGY EXAM  09/03/2021   COVID-19 Vaccine (5 - 2023-24 season) 09/25/2021   INFLUENZA VACCINE  08/26/2022   DEXA SCAN  09/02/2022   Diabetic kidney evaluation - Urine ACR  01/06/2023   HEMOGLOBIN A1C  01/12/2023   Diabetic kidney evaluation - eGFR measurement  07/13/2023   Medicare Annual Wellness (AWV)  08/26/2023   DTaP/Tdap/Td (2 - Td or Tdap) 07/28/2024   Pneumonia Vaccine 78+ Years old  Completed   Hepatitis C Screening  Completed   HPV VACCINES  Aged Out   Colonoscopy  Discontinued   Zoster Vaccines- Shingrix  Discontinued    Health Maintenance  Health Maintenance Due  Topic Date Due   FOOT EXAM  Never done   OPHTHALMOLOGY EXAM  09/03/2021   COVID-19 Vaccine (5 - 2023-24 season) 09/25/2021   INFLUENZA VACCINE  08/26/2022    Colorectal cancer screening: No longer required.   Mammogram status: Completed 05/10/2022. Repeat every year  Bone Density status: Ordered 08/26/2022. Pt provided with contact info and advised to call to schedule appt.  Lung Cancer Screening: (Low Dose CT Chest recommended if  Age 7-80 years, 20 pack-year currently smoking OR have quit w/in 15years.) does not qualify.   Lung Cancer Screening Referral: no  Additional Screening:  Hepatitis C Screening: does qualify; Completed 04/30/2015  Vision Screening: Recommended annual ophthalmology exams for early detection of glaucoma and other disorders of the eye. Is the patient up to date with their annual eye exam?  Yes  Who is the provider or what is the name of the office in which the patient attends annual eye exams? [facility] If pt is not established with a provider, would they like to be referred to a provider to establish care? No .   Dental Screening: Recommended annual dental exams for proper oral hygiene  Diabetic Foot Exam: N/A  Community Resource Referral / Chronic Care Management: CRR required this visit?  No   CCM required this visit?  No     Plan:     I have personally reviewed and noted the following in the patient's chart:   Medical and social history Use of alcohol, tobacco or illicit drugs  Current medications and supplements including opioid prescriptions. Patient is not currently taking opioid prescriptions. Functional ability and status Nutritional status Physical activity Advanced directives List of other physicians Hospitalizations, surgeries, and ER visits in previous 12 months Vitals Screenings to include cognitive, depression, and falls Referrals and appointments  In addition, I have reviewed and discussed with patient certain preventive protocols, quality metrics, and best practice recommendations. A written personalized care plan for preventive services as well as general preventive health recommendations were provided to patient.     Mickeal Needy, LPN   03/06/5619   After Visit Summary: (Mail) Due to this being a telephonic visit, the after visit summary with patients personalized plan was offered to patient via mail   Nurse Notes: Normal cognitive  status assessed by direct observation via telephone conversation by this Nurse Health Advisor. No abnormalities found.

## 2022-08-30 NOTE — Progress Notes (Signed)
08/26/2022  Fall risk was done and patient has had 1 or less falls.   N. , LPN. [facility] AWV Team Direct Dial: [phone]

## 2022-09-01 ENCOUNTER — Telehealth: Payer: Self-pay | Admitting: Internal Medicine

## 2022-09-01 NOTE — Telephone Encounter (Signed)
Her last blood work in June did not show anemia.  I did want her to try iron to see if it helped her rls.   She may need to come back in for evaluation

## 2022-09-01 NOTE — Telephone Encounter (Signed)
Kristina Ayala is anemic and she has been taking her iron pills and vitamin C. She said she is still having issues with being cold, brittle nails, and headaches. She would like to know what Dr. Lawerance Bach would advise to help. Kristina Ayala would like a call back at [phone].

## 2022-09-02 NOTE — Telephone Encounter (Signed)
Called and left message for patient.  Also sent her a my-chart message asking her to please call me back so I could switch her from Kristina Ayala to see Dr. Lawerance Bach next week since we had openings and we are currently treating her for the anemia.

## 2022-09-08 ENCOUNTER — Ambulatory Visit: Payer: 59 | Admitting: Family Medicine

## 2022-09-08 NOTE — Progress Notes (Signed)
    Subjective:    Patient ID: Kristina Ayala, female    DOB: 02-Feb-1945, 77 y.o.   MRN: [id number]      HPI Kristina Ayala is here for No chief complaint on file.        Medications and allergies reviewed with patient and updated if appropriate.  Current Outpatient Medications on File Prior to Visit  Medication Sig Dispense Refill   ALPRAZolam (XANAX) 0.5 MG tablet TAKE 1 TABLET(0.5 MG) BY MOUTH AT BEDTIME AS NEEDED FOR ANXIETY 30 tablet 0   amLODipine (NORVASC) 10 MG tablet      atorvastatin (LIPITOR) 40 MG tablet TAKE 1 TABLET(40 MG) BY MOUTH DAILY. FOLLOW-UP APPOINTMENT WITH LABS ARE DUE 90 tablet 0   cholecalciferol (VITAMIN D) 25 MCG (1000 UNIT) tablet Take 2,000 Units by mouth daily.     Cyanocobalamin (VITAMIN B-12) 2500 MCG SUBL Place 2,500 mcg under the tongue daily.     denosumab (PROLIA) 60 MG/ML SOSY injection INJECT 60MG  INTO THE SKIN AS DIRECTED 1 mL 0   fluticasone (FLONASE) 50 MCG/ACT nasal spray Place 2 sprays into both nostrils daily. 16 g 6   gabapentin (NEURONTIN) 100 MG capsule Take 2 capsules (200 mg total) by mouth at bedtime as needed. 180 capsule 0   hydrochlorothiazide (HYDRODIURIL) 25 MG tablet TAKE 1 TABLET(25 MG) BY MOUTH EVERY MORNING 90 tablet 3   lisinopril (ZESTRIL) 40 MG tablet TAKE 1 TABLET(40 MG) BY MOUTH DAILY 90 tablet 2   MAGNESIUM PO Take by mouth.     potassium chloride (KLOR-CON) 10 MEQ tablet Take 10 mEq by mouth daily.     TURMERIC PO Take 1 tablet by mouth daily.     No current facility-administered medications on file prior to visit.    Review of Systems     Objective:  There were no vitals filed for this visit. BP Readings from Last 3 Encounters:  08/06/22 133/79  07/30/22 (!) 156/79  07/19/22 130/84   Wt Readings from Last 3 Encounters:  08/26/22 165 lb (74.8 kg)  08/06/22 163 lb 6.4 oz (74.1 kg)  07/19/22 165 lb (74.8 kg)   There is no height or weight on file to calculate BMI.    Physical Exam          Assessment & Plan:    See Problem List for Assessment and Plan of chronic medical problems.

## 2022-09-09 ENCOUNTER — Ambulatory Visit (INDEPENDENT_AMBULATORY_CARE_PROVIDER_SITE_OTHER): Payer: 59 | Admitting: Internal Medicine

## 2022-09-09 ENCOUNTER — Encounter: Payer: Self-pay | Admitting: Internal Medicine

## 2022-09-09 VITALS — BP 130/80 | HR 83 | Temp 98.5°F | Ht 63.5 in | Wt 160.6 lb

## 2022-09-09 DIAGNOSIS — R269 Unspecified abnormalities of gait and mobility: Secondary | ICD-10-CM

## 2022-09-09 DIAGNOSIS — F419 Anxiety disorder, unspecified: Secondary | ICD-10-CM

## 2022-09-09 DIAGNOSIS — R6889 Other general symptoms and signs: Secondary | ICD-10-CM

## 2022-09-09 DIAGNOSIS — E119 Type 2 diabetes mellitus without complications: Secondary | ICD-10-CM | POA: Diagnosis not present

## 2022-09-09 DIAGNOSIS — R5382 Chronic fatigue, unspecified: Secondary | ICD-10-CM | POA: Diagnosis not present

## 2022-09-09 DIAGNOSIS — I10 Essential (primary) hypertension: Secondary | ICD-10-CM | POA: Diagnosis not present

## 2022-09-09 LAB — CBC WITH DIFFERENTIAL/PLATELET
Basophils Absolute: 0.1 10*3/uL (ref 0.0–0.1)
Basophils Relative: 0.9 % (ref 0.0–3.0)
Eosinophils Absolute: 0.1 10*3/uL (ref 0.0–0.7)
Eosinophils Relative: 2.4 % (ref 0.0–5.0)
HCT: 43.2 % (ref 36.0–46.0)
Hemoglobin: 13.7 g/dL (ref 12.0–15.0)
Lymphocytes Relative: 39.5 % (ref 12.0–46.0)
Lymphs Abs: 2.5 10*3/uL (ref 0.7–4.0)
MCHC: 31.7 g/dL (ref 30.0–36.0)
MCV: 84.4 fl (ref 78.0–100.0)
Monocytes Absolute: 0.7 10*3/uL (ref 0.1–1.0)
Monocytes Relative: 10.4 % (ref 3.0–12.0)
Neutro Abs: 3 10*3/uL (ref 1.4–7.7)
Neutrophils Relative %: 46.8 % (ref 43.0–77.0)
Platelets: 319 10*3/uL (ref 150.0–400.0)
RBC: 5.12 Mil/uL — ABNORMAL HIGH (ref 3.87–5.11)
RDW: 15.7 % — ABNORMAL HIGH (ref 11.5–15.5)
WBC: 6.3 10*3/uL (ref 4.0–10.5)

## 2022-09-09 LAB — COMPREHENSIVE METABOLIC PANEL
ALT: 25 U/L (ref 0–35)
AST: 19 U/L (ref 0–37)
Albumin: 4.4 g/dL (ref 3.5–5.2)
Alkaline Phosphatase: 45 U/L (ref 39–117)
BUN: 17 mg/dL (ref 6–23)
CO2: 30 mEq/L (ref 19–32)
Calcium: 9.2 mg/dL (ref 8.4–10.5)
Chloride: 101 mEq/L (ref 96–112)
Creatinine, Ser: 0.8 mg/dL (ref 0.40–1.20)
GFR: 70.54 mL/min (ref >60.00)
Glucose, Bld: 97 mg/dL (ref 70–99)
Potassium: 3.4 mEq/L — ABNORMAL LOW (ref 3.5–5.1)
Sodium: 139 mEq/L (ref 135–145)
Total Bilirubin: 0.4 mg/dL (ref 0.2–1.2)
Total Protein: 7.2 g/dL (ref 6.0–8.3)

## 2022-09-09 LAB — VITAMIN B12: Vitamin B-12: >1501 pg/mL — ABNORMAL HIGH (ref 211–911)

## 2022-09-09 LAB — TSH: TSH: 0.67 u[IU]/mL (ref 0.35–5.50)

## 2022-09-09 LAB — HEMOGLOBIN A1C: Hgb A1c MFr Bld: 6.2 % (ref 4.6–6.5)

## 2022-09-09 NOTE — Progress Notes (Signed)
[name] [address] [place] [address] Phone: [phone] Subjective:   Bruce Donath, am serving as a scribe for Dr. Antoine Primas.  I'm seeing this patient by the request  of:  Pincus Sanes, MD  CC: Low back pain  RSW:[id number]  07/19/2022 Worsening lumbar radiculopathy.  Has responded extremely well to epidurals in the past.  Discussed icing regimen and home exercises.  Increase activity slowly.  Toradol and Depo-Medrol given today secondary to the exacerbation and will order the epidural again which patient has responded to.  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  Hopefully this will make significant improvement.      Update 09/14/2022 Kristina Ayala is a 77 y.o. female coming in with complaint of lumbar spine pain.Epidural 07/30/2022.  Patient states that she fell last night. L knee is painful in flexion.  Patient states that from time to time feels like she has some instability of the knee.  Notices R ankle seems unstable and is giving out on her.        Past Medical History:  Diagnosis Date   Arthritis    Bursitis    left elbow   Clostridium difficile infection    Diabetes mellitus without complication (HCC)    Frequent falls    HLD (hyperlipidemia)    Hypertension    Infectious colitis    Past Surgical History:  Procedure Laterality Date   CATARACT EXTRACTION Bilateral    COLONOSCOPY     ORIF PROXIMAL TIBIAL PLATEAU FRACTURE Left 02/19/2012   knee   POLYPECTOMY     TONSILLECTOMY     Social History   Socioeconomic History   Marital status: Divorced    Spouse name: Not on file   Number of children: 2   Years of education: Not on file   Highest education level: Some college, no degree  Occupational History   Occupation: RETIRED  Tobacco Use   Smoking status: Former    Current packs/day: 0.00    Average packs/day: 0.3  packs/day for 50.0 years (12.5 ttl pk-yrs)    Types: Cigarettes    Start date: 08/27/1964    Quit date: 08/28/2014    Years since quitting: 8.0   Smokeless tobacco: Never   Tobacco comments:    1 pack every 3 days  Vaping Use   Vaping status: Never Used  Substance and Sexual Activity   Alcohol use: No    Alcohol/week: 0.0 standard drinks of alcohol   Drug use: No   Sexual activity: Not Currently  Other Topics Concern   Not on file  Social History Narrative   Lives alone    Right handed   Caffeine: 3x weekly         Social Determinants of Health   Financial Resource Strain: Low Risk  (08/26/2022)   Overall Financial Resource Strain (CARDIA)    Difficulty of Paying Living Expenses: Not hard at all  Food Insecurity: No Food Insecurity (08/26/2022)   Hunger Vital Sign    Worried About Running Out of Food in the Last Year: Never true    Ran Out of Food in the Last Year: Never true  Transportation Needs: No Transportation Needs (08/26/2022)   PRAPARE - Administrator, Civil Service (Medical): No    Lack of Transportation (Non-Medical): No  Physical Activity: Inactive (08/26/2022)   Exercise  Vital Sign    Days of Exercise per Week: 0 days    Minutes of Exercise per Session: 0 min  Stress: No Stress Concern Present (08/26/2022)   [employer] of Occupational Health - Occupational Stress Questionnaire    Feeling of Stress : Only a little  Social Connections: Moderately Integrated (08/26/2022)   Social Connection and Isolation Panel [NHANES]    Frequency of Communication with Friends and Family: More than three times a week    Frequency of Social Gatherings with Friends and Family: Three times a week    Attends Religious Services: More than 4 times per year    Active Member of Clubs or Organizations: Yes    Attends Banker Meetings: More than 4 times per year    Marital Status: Divorced   Allergies  Allergen Reactions   Doxycycline Diarrhea   Minocycline  Diarrhea   Wellbutrin [Bupropion] Swelling    Per pt her tongue was swollen and sore/symptoms stopped once the medication was discontinued.    Penicillins Rash    Has patient had a PCN reaction causing immediate rash, facial/tongue/throat swelling, SOB or lightheadedness with hypotension: no Has patient had a PCN reaction causing severe rash involving mucus membranes or skin necrosis: no Has patient had a PCN reaction that required hospitalization: unknown Has patient had a PCN reaction occurring within the last 10 years: no If all of the above answers are "NO", then may proceed with Cephalosporin use.    Z-Pak [Azithromycin] Other (See Comments)    diarrhea   Family History  Problem Relation Age of Onset   Diverticulosis Mother    Ovarian cancer Mother    Pancreatic cancer Father    Lung cancer Father    Colon polyps Sister    Colon cancer Neg Hx    Breast cancer Neg Hx     Current Outpatient Medications (Endocrine & Metabolic):    denosumab (PROLIA) 60 MG/ML SOSY injection, INJECT 60MG  INTO THE SKIN AS DIRECTED  Current Outpatient Medications (Cardiovascular):    amLODipine (NORVASC) 10 MG tablet,    atorvastatin (LIPITOR) 40 MG tablet, TAKE 1 TABLET(40 MG) BY MOUTH DAILY. FOLLOW-UP APPOINTMENT WITH LABS ARE DUE   hydrochlorothiazide (HYDRODIURIL) 25 MG tablet, TAKE 1 TABLET(25 MG) BY MOUTH EVERY MORNING   lisinopril (ZESTRIL) 40 MG tablet, TAKE 1 TABLET(40 MG) BY MOUTH DAILY  Current Outpatient Medications (Respiratory):    fluticasone (FLONASE) 50 MCG/ACT nasal spray, Place 2 sprays into both nostrils daily.   Current Outpatient Medications (Hematological):    Cyanocobalamin (VITAMIN B-12) 2500 MCG SUBL, Place 2,500 mcg under the tongue daily.  Current Outpatient Medications (Other):    ALPRAZolam (XANAX) 0.5 MG tablet, TAKE 1 TABLET(0.5 MG) BY MOUTH AT BEDTIME AS NEEDED FOR ANXIETY   cholecalciferol (VITAMIN D) 25 MCG (1000 UNIT) tablet, Take 2,000 Units by mouth  daily.   gabapentin (NEURONTIN) 100 MG capsule, Take 2 capsules (200 mg total) by mouth at bedtime as needed.   MAGNESIUM PO, Take by mouth.   potassium chloride (KLOR-CON) 10 MEQ tablet, Take 10 mEq by mouth daily.   TURMERIC PO, Take 1 tablet by mouth daily.   Reviewed prior external information including notes and imaging from  primary care provider As well as notes that were available from care everywhere and other healthcare systems.  Past medical history, social, surgical and family history all reviewed in electronic medical record.  No pertanent information unless stated regarding to the chief complaint.   Review of Systems:  No headache, visual changes, nausea, vomiting, diarrhea, constipation, dizziness, abdominal pain, skin rash, fevers, chills, night sweats, weight loss, swollen lymph nodes,  chest pain, shortness of breath, mood changes. POSITIVE muscle aches, joint swelling, body aches  Objective  Blood pressure 114/72, pulse 83, height 5' 3.5" (1.613 m), weight 159 lb (72.1 kg), SpO2 98%.   General: No apparent distress alert and oriented x3 mood and affect normal, dressed appropriately.  HEENT: Pupils equal, extraocular movements intact  Respiratory: Patient's speak in full sentences and does not appear short of breath  Cardiovascular: No lower extremity edema, non tender, no erythema  Patient's left knee does have trace effusion noted.  Mild crepitus noted.  Right ankle does have good range of motion noted at this time.  Limited muscular skeletal ultrasound was performed and interpreted by Antoine Primas, M  Left knee ultrasound shows the patient does have what appears to be trace effusion noted of the patellofemoral joint. Mild narrowing of the medial joint line.  Degenerative changes of the meniscus noted. Impression: Trace effusion noted of the joint space.   Impression and Recommendations:    The above documentation has been reviewed and is accurate and complete  Judi Saa, DO

## 2022-09-09 NOTE — Assessment & Plan Note (Signed)
Chronic Has had a little increased anxiety recently related to having her granddaughter stay with her this summer-she will be leaving in just over a week which will likely decrease some of her stress level No need for medication adjustment Continue alprazolam 0.5 mg nightly as needed-does not take often

## 2022-09-09 NOTE — Patient Instructions (Addendum)
      Blood work was ordered.   The lab is on the first floor.    Medications changes include :   none     Return for follow up as scheduled.   Call neurology -  to follow up on the referral ordered earlier this month [facility] [phone] [address] [place],  [place]  [address] Main: [phone]

## 2022-09-09 NOTE — Assessment & Plan Note (Addendum)
Subacute Concerned lower thyroid, vitamin B12 level, anemia I do think that she is not sleeping enough and that is likely contributing to some of her fatigue I also think she has been much busier caring for her granddaughter this summer and that is likely contributing Blood work a couple months ago was normal, but we will go ahead and recheck We will check CBC, CMP, TSH, vitamin B12, A1c

## 2022-09-09 NOTE — Assessment & Plan Note (Signed)
Chronic States difficulty with her gait, unsteady at times Discussed that there are several things that could be contributing I have referred her to neurology the beginning of this month and it looks like they had called her but she is does not recall this-will give her the number to call and schedule an appointment to rule out neurological cause Seeing Dr. Katrinka Blazing downstairs for orthopedic issues, which may also be contributing She is not currently exercising once her granddaughter leaves she will get back to water aerobics She will use a cane to ambulate since she is unsteady

## 2022-09-09 NOTE — Assessment & Plan Note (Signed)
Chronic Blood pressure looks good here today Continue amlodipine 10 mg at night, hydrochlorothiazide 25 mg, lisinopril 40 mg in the morning

## 2022-09-09 NOTE — Assessment & Plan Note (Signed)
Subacute States cold intolerance which is new for her She has lost some weight because she has been more active and eating differently which could be contributing Doubt anemia since blood work 2 months ago was normal Will check thyroid, CBC, CMP

## 2022-09-09 NOTE — Assessment & Plan Note (Addendum)
Chronic   Lab Results  Component Value Date   HGBA1C 6.1 07/13/2022   Sugars well controlled Check A1c Continue lifestyle control Stressed regular exercise - will start once her granddaughter leaves, diabetic diet-has not been eating as good since her granddaughter has been with her this summer

## 2022-09-14 ENCOUNTER — Other Ambulatory Visit: Payer: Self-pay

## 2022-09-14 ENCOUNTER — Ambulatory Visit (INDEPENDENT_AMBULATORY_CARE_PROVIDER_SITE_OTHER): Payer: 59 | Admitting: Family Medicine

## 2022-09-14 ENCOUNTER — Encounter: Payer: Self-pay | Admitting: Family Medicine

## 2022-09-14 VITALS — BP 114/72 | HR 83 | Ht 63.5 in | Wt 159.0 lb

## 2022-09-14 DIAGNOSIS — M76821 Posterior tibial tendinitis, right leg: Secondary | ICD-10-CM

## 2022-09-14 DIAGNOSIS — M25562 Pain in left knee: Secondary | ICD-10-CM

## 2022-09-14 DIAGNOSIS — M1732 Unilateral post-traumatic osteoarthritis, left knee: Secondary | ICD-10-CM

## 2022-09-14 DIAGNOSIS — M25371 Other instability, right ankle: Secondary | ICD-10-CM | POA: Diagnosis not present

## 2022-09-14 NOTE — Patient Instructions (Signed)
[facility] Looks like a bone bruise See me in 2 months

## 2022-09-14 NOTE — Assessment & Plan Note (Signed)
Continues to have some instability of the ankle noted.  Patient has had no difficulty.  We discussed with patient about strengthening.  Feels like she would do better in physical therapy where she would have time for herself.  Has been not focusing on herself secondary to taking care of her grandchildren.  Will see if protected time will be more beneficial.  Discussed with patient about icing regimen and home exercises.  Discussed proper shoes and over-the-counter orthotics.  Follow-up again in 6 to 8 weeks.

## 2022-09-14 NOTE — Assessment & Plan Note (Signed)
Continues to have some instability of the knee.  Given injection by another provider.  We discussed with patient to monitor at this time.  Will start some physical therapy which I think will be beneficial.  Could use bracing if necessary.  Follow-up again in 6 to 8 weeks and we will consider repeat injections if needed.

## 2022-09-24 ENCOUNTER — Telehealth: Payer: Self-pay | Admitting: Family Medicine

## 2022-09-24 NOTE — Telephone Encounter (Signed)
Patient called asking if another epidural could be ordered for her?  ? ?Please advise. ? ?

## 2022-09-28 ENCOUNTER — Encounter: Payer: Self-pay | Admitting: *Deleted

## 2022-09-28 NOTE — Telephone Encounter (Signed)
Yes please

## 2022-09-29 ENCOUNTER — Telehealth: Payer: Self-pay | Admitting: Internal Medicine

## 2022-09-29 ENCOUNTER — Other Ambulatory Visit: Payer: Self-pay | Admitting: Family Medicine

## 2022-09-29 ENCOUNTER — Encounter: Payer: Self-pay | Admitting: Obstetrics and Gynecology

## 2022-09-29 ENCOUNTER — Telehealth: Payer: Self-pay | Admitting: Obstetrics and Gynecology

## 2022-09-29 DIAGNOSIS — M5416 Radiculopathy, lumbar region: Secondary | ICD-10-CM

## 2022-09-29 NOTE — Telephone Encounter (Signed)
Patient called and needs a letter to Whom It May concern - this letter is for her housing and her job  Patient just needs it to say that she has been referred to a neurologist and she has been advised to continue her water aerobics.  Please call patient when this is ready for pick up  [phone]

## 2022-09-29 NOTE — Telephone Encounter (Signed)
Can you let her know I have sent it to her in MyChart so she does not need to come to the office? If she needs a printed copy I can do it and have it ready for her but otherwise it has been sent to her via MyChart.

## 2022-09-29 NOTE — Telephone Encounter (Signed)
Patient called saying she needs a letter stating she has been referred to PT.  This is being requested from her job.  She would like to pick it up today.  I told her I would send a message back, but we were in clinic and could not give a specific time when it could be ready.  She does have mychart but no printer at home.  Please let her know when it's read to be picked up.

## 2022-09-30 ENCOUNTER — Encounter: Payer: Self-pay | Admitting: Internal Medicine

## 2022-09-30 ENCOUNTER — Ambulatory Visit: Payer: 59 | Attending: Family Medicine | Admitting: Physical Therapy

## 2022-09-30 ENCOUNTER — Encounter: Payer: Self-pay | Admitting: Physical Therapy

## 2022-09-30 ENCOUNTER — Other Ambulatory Visit: Payer: Self-pay

## 2022-09-30 DIAGNOSIS — R29898 Other symptoms and signs involving the musculoskeletal system: Secondary | ICD-10-CM | POA: Diagnosis not present

## 2022-09-30 DIAGNOSIS — R2681 Unsteadiness on feet: Secondary | ICD-10-CM | POA: Diagnosis not present

## 2022-09-30 DIAGNOSIS — M25571 Pain in right ankle and joints of right foot: Secondary | ICD-10-CM | POA: Diagnosis not present

## 2022-09-30 DIAGNOSIS — G8929 Other chronic pain: Secondary | ICD-10-CM | POA: Insufficient documentation

## 2022-09-30 DIAGNOSIS — M25562 Pain in left knee: Secondary | ICD-10-CM | POA: Insufficient documentation

## 2022-09-30 DIAGNOSIS — M6281 Muscle weakness (generalized): Secondary | ICD-10-CM | POA: Insufficient documentation

## 2022-09-30 DIAGNOSIS — M25371 Other instability, right ankle: Secondary | ICD-10-CM | POA: Diagnosis not present

## 2022-09-30 NOTE — Telephone Encounter (Signed)
Spoke with patient today.  Letter ready but needs to be signed.  She will pick up letter in the morning.

## 2022-09-30 NOTE — Telephone Encounter (Signed)
Pt came in today wondering if the paperwork she requested is done.

## 2022-09-30 NOTE — Therapy (Signed)
OUTPATIENT PHYSICAL THERAPY LOWER EXTREMITY EVALUATION   Patient Name: Kristina Ayala MRN: [id number] DOB:[birth year], 77 y.o., female Today's Date: 09/30/2022  END OF SESSION:  PT End of Session - 09/30/22 0905     Visit Number 1    Number of Visits 13    Date for PT Re-Evaluation 11/11/22    Authorization Type [employer] MCR    Authorization Time Period 09/30/22 to 11/11/22    Progress Note Due on Visit 10    PT Start Time 0801    PT Stop Time 0844    PT Time Calculation (min) 43 min    Activity Tolerance Patient tolerated treatment well    Behavior During Therapy [facility] for tasks assessed/performed             Past Medical History:  Diagnosis Date   Arthritis    Bursitis    left elbow   Clostridium difficile infection    Diabetes mellitus without complication (HCC)    Frequent falls    HLD (hyperlipidemia)    Hypertension    Infectious colitis    Past Surgical History:  Procedure Laterality Date   CATARACT EXTRACTION Bilateral    COLONOSCOPY     ORIF PROXIMAL TIBIAL PLATEAU FRACTURE Left 02/19/2012   knee   POLYPECTOMY     TONSILLECTOMY     Patient Active Problem List   Diagnosis Date Noted   Cold intolerance 09/09/2022   Gait abnormality 09/09/2022   Urge incontinence 05/13/2022   Right ovarian cyst 05/13/2022   Tibialis posterior tendinitis, right 04/29/2022   Palpitations 04/12/2022   Fecal smearing 04/12/2022   Foot injury, left, initial encounter 09/16/2021   Tingling 03/27/2021   Chronic fatigue 03/27/2021   Itchy scalp 12/09/2020   Excessive daytime sleepiness 11/03/2020   Coronary artery calcification seen on CAT scan 09/09/2020   Aortic atherosclerosis (HCC) 08/31/2020   Right rib fracture 07/10/2020   Closed low lateral malleolus fracture 07/10/2020   Snoring 05/23/2020   Cough 12/04/2019   Pancreatic mass 11/08/2019   DOE (dyspnea on exertion) 03/20/2019   Chronic left SI joint pain 03/13/2019   Osteoarthritis 01/10/2019   Right lower  quadrant abdominal pain 11/09/2018   Adnexal mass, right 11/09/2018   Lung nodules - stable - no f/u needed 06/19/2018   Thyromegaly 10/28/2017   Restless leg syndrome 10/12/2017   Greater trochanteric bursitis of left hip 07/26/2017   Hypokalemia 04/18/2017   Degenerative arthritis of left knee 12/01/2016   Anxiety 12/01/2016   Diabetes mellitus without complication (HCC) 09/16/2016   Bursitis of right shoulder 09/04/2015   Lateral meniscus derangement 06/03/2015   Tear of LCL (lateral collateral ligament) of knee 06/03/2015   Lumbar radiculopathy 09/19/2014   Low back pain 01/04/2014   Bilateral knee pain 01/04/2014   Diverticulosis of colon without hemorrhage 07/26/2013   Diarrhea 07/26/2013   Trigger thumb of left hand 03/06/2013   Essential hypertension, benign 02/15/2013   Hyperlipidemia 09/28/2012   COPD (chronic obstructive pulmonary disease) (HCC) 05/22/2012   Osteoporosis - on prolia 05/22/2012    PCP: Cheryll Cockayne MD   REFERRING PROVIDER: Judi Saa, DO  REFERRING DIAG: [id number] (ICD-10-CM) - Acute pain of left knee M25.371 (ICD-10-CM) - Right ankle instability  THERAPY DIAG:  Chronic pain of left knee  Pain in right ankle and joints of right foot  Muscle weakness (generalized)  Other symptoms and signs involving the musculoskeletal system  Unsteadiness on feet  Rationale for Evaluation and Treatment: Rehabilitation  ONSET DATE:  L knee chronic, R ankle June/July 2024  SUBJECTIVE:   SUBJECTIVE STATEMENT: I broke my L knee in 2014, broke it in 3 places and got hardware put in and it just acts up now and then. Not sure what's happening with the R ankle but it gives out, have fallen several times this year due to freak things. When I'm trying to pivot the R ankle gives me issues and makes me fall to the L. I do need help to get up after I fall. Getting an epidural in my back next month, get these about once every 3 months and I've noticed they help a lot  with my balance too. I feel like I wobble a lot, balance is hard.   PERTINENT HISTORY: OA, hx Cdiff, DM, frequent falls, HLD, HTN, L tibial ORIF 2014 PAIN:  Are you having pain?  "No problem with pain now"/10, at worst can get to 8/10 in my back especially and sometimes the knee   PRECAUTIONS: Fall  RED FLAGS: None   WEIGHT BEARING RESTRICTIONS: No  FALLS:  Has patient fallen in last 6 months? Yes. Number of falls 3- one turned on a windy day/lost balance and fell on her back, another turned after talking with neighbors and tripped on steps, third fall fell when pivoting/turning as well. No FOF.    LIVING ENVIRONMENT: Lives with: lives alone Lives in: Other townhouse with chair lift but chair lift is broken Stairs:  3-4 STE with rails, flight inside home with broken chair lift  Has following equipment at home:  broken chair lift, 2 canes (keeps one in car)  OCCUPATION: retired   PLOF: Independent, Independent with basic ADLs, Independent with gait, and Independent with transfers  PATIENT GOALS: MD told me to come, need some help getting back into aerobics, improve balance   NEXT MD VISIT: Referring October 21st  OBJECTIVE:     PATIENT SURVEYS:  FOTO 2nd visit   COGNITION: Overall cognitive status: Within functional limits for tasks assessed     SENSATION: Not tested        LOWER EXTREMITY ROM:  Active ROM Left  eval Right  eval  Hip flexion    Hip extension    Hip abduction    Hip adduction    Hip internal rotation    Hip external rotation    Knee flexion 123* 127*  Knee extension -2* -4*  Ankle dorsiflexion 4* 10*  Ankle plantarflexion 60* 50*  Ankle inversion    Ankle eversion     (Blank rows = not tested)  LOWER EXTREMITY MMT:  MMT Right eval Left eval  Hip flexion 4 4-  Hip extension    Hip abduction 3+ 3+  Hip adduction    Hip internal rotation    Hip external rotation    Knee flexion 4+ 4-  Knee extension 4+ 4+  Ankle  dorsiflexion 4 4  Ankle plantarflexion 2- 2  Ankle inversion 4+ 4+  Ankle eversion 4+ 4+   (Blank rows = not tested)    FUNCTIONAL TESTS:  Functional gait assessment: 12/30  LLE quite a bit longer than R- about 1/2 to 3/4 inch difference per observation, able to correct with MET but gait still "wobbly"    TODAY'S TREATMENT:  DATE:   Eval 09/30/22  Discussed and practiced HEP      PATIENT EDUCATION:  Education details: exam findings, POC, HEP, encouraged use of SPC, discussed heel lift  Person educated: Patient Education method: Explanation, Demonstration, and Handouts Education comprehension: verbalized understanding, returned demonstration, and needs further education  HOME EXERCISE PROGRAM: Access Code: [email or web address] URL: [email or web address] Date: 09/30/2022 Prepared by: Nedra Hai  Exercises - Sit to Stand with Resistance Around Legs  - 1-2 x daily - 7 x weekly - 1 sets - 10 reps - Hip Abduction with Resistance Loop  - 1-2 x daily - 7 x weekly - 1 sets - 10 reps - 1 second  hold - Tandem Stance in Corner  - 1-2 x daily - 7 x weekly - 1 sets - 6 reps - 15-30 seconds  hold  ASSESSMENT:  CLINICAL IMPRESSION: Patient is a 77 y.o. F who was seen today for physical therapy evaluation and treatment for M25.562 (ICD-10-CM) - Acute pain of left knee M25.371 (ICD-10-CM) - Right ankle instability. Exam with objective findings as above. Will benefit from skilled PT services to address all subjective and objective concerns, minimize fall risk, and optimize overall level of function.   OBJECTIVE IMPAIRMENTS: Abnormal gait, decreased activity tolerance, decreased balance, decreased mobility, difficulty walking, decreased ROM, decreased strength, impaired flexibility, postural dysfunction, and pain.   ACTIVITY LIMITATIONS: standing, squatting,  stairs, transfers, and locomotion level  PARTICIPATION LIMITATIONS: driving, shopping, community activity, and yard work  PERSONAL FACTORS: Age, Behavior pattern, Fitness, Past/current experiences, Social background, and Time since onset of injury/illness/exacerbation are also affecting patient's functional outcome.   REHAB POTENTIAL: Good  CLINICAL DECISION MAKING: Stable/uncomplicated  EVALUATION COMPLEXITY: Low   GOALS: Goals reviewed with patient? Yes  SHORT TERM GOALS: Target date: 10/21/2022   Will be compliant with appropriate progressive HEP  Baseline: Goal status: INITIAL  2.  AROM in B ankles to be equal and symmetrical  Baseline:  Goal status: INITIAL  3.  Will be able to make sharp turns quickly with minimal loss of balance, no more than MinA for safety  Baseline:  Goal status: INITIAL    LONG TERM GOALS: Target date: 11/11/2022    MMT to improve by at least one grade in all weak groups  Baseline:  Goal status: INITIAL  2.  Will score at least 20 on FGA to show improved balance/reduced fall risk  Baseline:  Goal status: INITIAL  3.  Will be compliant with appropriate balanced exercise program to include flexibility work, balance, cardio, and strength  Baseline:  Goal status: INITIAL     PLAN:  PT FREQUENCY: 2x/week  PT DURATION: 6 weeks  PLANNED INTERVENTIONS: Therapeutic exercises, Therapeutic activity, Neuromuscular re-education, Gait training, Self Care, Cryotherapy, Moist heat, Manual therapy, and Re-evaluation  PLAN FOR NEXT SESSION: ankle ROM, general strength and balance training; encourage shoe lift   Nedra Hai, PT, DPT 09/30/22 9:07 AM

## 2022-10-01 NOTE — Telephone Encounter (Signed)
Patient has picked up letter.

## 2022-10-04 ENCOUNTER — Inpatient Hospital Stay
Admission: RE | Admit: 2022-10-04 | Discharge: 2022-10-04 | Disposition: A | Discharge status: Home / Self Care | Payer: 59 | Source: Ambulatory Visit | Attending: Family Medicine | Admitting: Family Medicine

## 2022-10-04 ENCOUNTER — Encounter: Payer: 59 | Admitting: Occupational Therapy

## 2022-10-04 NOTE — Discharge Instructions (Signed)

## 2022-10-06 ENCOUNTER — Telehealth: Payer: Self-pay | Admitting: Internal Medicine

## 2022-10-06 ENCOUNTER — Other Ambulatory Visit: Payer: Self-pay | Admitting: Internal Medicine

## 2022-10-06 NOTE — Telephone Encounter (Signed)
Prescription Request  10/06/2022  LOV: 09/09/2022  What is the name of the medication or equipment?  ALPRAZolam (XANAX) 0.5 MG tablet   Have you contacted your pharmacy to request a refill? No   Which pharmacy would you like this sent to?  [id number] [place], [place] - [phone] AT [facility] OF [facility] & [address] [place] [address] Phone: [phone] Fax: [phone]    Patient notified that their request is being sent to the clinical staff for review and that they should receive a response within 2 business days.   Please advise at Mobile [phone] (mobile)

## 2022-10-06 NOTE — Telephone Encounter (Signed)
Daniel from [employer] called and said patient has a history of muscle related issues. He said sometimes -statin medications can contribute to muscle problems. He wanted to suggest taking her off her atorvastatin, at least temporarily to help. Reuel Boom would like a call back at [phone].

## 2022-10-07 ENCOUNTER — Encounter: Payer: 59 | Admitting: Occupational Therapy

## 2022-10-07 NOTE — Telephone Encounter (Signed)
Electronic request already received from pharmacy this morning.

## 2022-10-07 NOTE — Telephone Encounter (Signed)
Left message for patient and also sent her a my-chart regarding following up with me do discuss her muscle aches.

## 2022-10-12 ENCOUNTER — Ambulatory Visit: Payer: 59 | Admitting: Physical Therapy

## 2022-10-12 ENCOUNTER — Encounter: Payer: Self-pay | Admitting: Physical Therapy

## 2022-10-12 DIAGNOSIS — M25562 Pain in left knee: Secondary | ICD-10-CM | POA: Diagnosis not present

## 2022-10-12 DIAGNOSIS — M6281 Muscle weakness (generalized): Secondary | ICD-10-CM

## 2022-10-12 DIAGNOSIS — R2681 Unsteadiness on feet: Secondary | ICD-10-CM | POA: Diagnosis not present

## 2022-10-12 DIAGNOSIS — G8929 Other chronic pain: Secondary | ICD-10-CM

## 2022-10-12 DIAGNOSIS — R29898 Other symptoms and signs involving the musculoskeletal system: Secondary | ICD-10-CM | POA: Diagnosis not present

## 2022-10-12 DIAGNOSIS — M25371 Other instability, right ankle: Secondary | ICD-10-CM | POA: Diagnosis not present

## 2022-10-12 DIAGNOSIS — M25571 Pain in right ankle and joints of right foot: Secondary | ICD-10-CM

## 2022-10-12 NOTE — Therapy (Signed)
OUTPATIENT PHYSICAL THERAPY LOWER EXTREMITY EVALUATION   Patient Name: Kristina Ayala MRN: [id number] DOB:November 30, 1945, 77 y.o., female Today's Date: 10/12/2022  END OF SESSION:  PT End of Session - 10/12/22 0930     Visit Number 2    Date for PT Re-Evaluation 11/11/22    PT Start Time 0930    PT Stop Time 1010    PT Time Calculation (min) 40 min             Past Medical History:  Diagnosis Date   Arthritis    Bursitis    left elbow   Clostridium difficile infection    Diabetes mellitus without complication (HCC)    Frequent falls    HLD (hyperlipidemia)    Hypertension    Infectious colitis    Past Surgical History:  Procedure Laterality Date   CATARACT EXTRACTION Bilateral    COLONOSCOPY     ORIF PROXIMAL TIBIAL PLATEAU FRACTURE Left 02/19/2012   knee   POLYPECTOMY     TONSILLECTOMY     Patient Active Problem List   Diagnosis Date Noted   Cold intolerance 09/09/2022   Gait abnormality 09/09/2022   Urge incontinence 05/13/2022   Right ovarian cyst 05/13/2022   Tibialis posterior tendinitis, right 04/29/2022   Palpitations 04/12/2022   Fecal smearing 04/12/2022   Foot injury, left, initial encounter 09/16/2021   Tingling 03/27/2021   Chronic fatigue 03/27/2021   Itchy scalp 12/09/2020   Excessive daytime sleepiness 11/03/2020   Coronary artery calcification seen on CAT scan 09/09/2020   Aortic atherosclerosis (HCC) 08/31/2020   Right rib fracture 07/10/2020   Closed low lateral malleolus fracture 07/10/2020   Snoring 05/23/2020   Cough 12/04/2019   Pancreatic mass 11/08/2019   DOE (dyspnea on exertion) 03/20/2019   Chronic left SI joint pain 03/13/2019   Osteoarthritis 01/10/2019   Right lower quadrant abdominal pain 11/09/2018   Adnexal mass, right 11/09/2018   Lung nodules - stable - no f/u needed 06/19/2018   Thyromegaly 10/28/2017   Restless leg syndrome 10/12/2017   Greater trochanteric bursitis of left hip 07/26/2017   Hypokalemia  04/18/2017   Degenerative arthritis of left knee 12/01/2016   Anxiety 12/01/2016   Diabetes mellitus without complication (HCC) 09/16/2016   Bursitis of right shoulder 09/04/2015   Lateral meniscus derangement 06/03/2015   Tear of LCL (lateral collateral ligament) of knee 06/03/2015   Lumbar radiculopathy 09/19/2014   Low back pain 01/04/2014   Bilateral knee pain 01/04/2014   Diverticulosis of colon without hemorrhage 07/26/2013   Diarrhea 07/26/2013   Trigger thumb of left hand 03/06/2013   Essential hypertension, benign 02/15/2013   Hyperlipidemia 09/28/2012   COPD (chronic obstructive pulmonary disease) (HCC) 05/22/2012   Osteoporosis - on prolia 05/22/2012    PCP: Cheryll Cockayne MD   REFERRING PROVIDER: Judi Saa, DO  REFERRING DIAG: [phone] (ICD-10-CM) - Acute pain of left knee M25.371 (ICD-10-CM) - Right ankle instability  THERAPY DIAG:  Chronic pain of left knee  Pain in right ankle and joints of right foot  Muscle weakness (generalized)  Other symptoms and signs involving the musculoskeletal system  Unsteadiness on feet  Rationale for Evaluation and Treatment: Rehabilitation  ONSET DATE: L knee chronic, R ankle June/July 2024  SUBJECTIVE:   SUBJECTIVE STATEMENT: Patient reports she did not get her epidural because she had to go to [facility] for her daughter who was in CHF. She is trying to re-schedule. She is feeling a lot of stress.  PERTINENT HISTORY: OA,  hx Cdiff, DM, frequent falls, HLD, HTN, L tibial ORIF 2014 PAIN:  Are you having pain?  "No problem with pain now"/10, at worst can get to 8/10 in my back especially and sometimes the knee   PRECAUTIONS: Fall  RED FLAGS: None   WEIGHT BEARING RESTRICTIONS: No  FALLS:  Has patient fallen in last 6 months? Yes. Number of falls 3- one turned on a windy day/lost balance and fell on her back, another turned after talking with neighbors and tripped on steps, third fall fell when pivoting/turning as  well. No FOF.    LIVING ENVIRONMENT: Lives with: lives alone Lives in: Other townhouse with chair lift but chair lift is broken Stairs:  3-4 STE with rails, flight inside home with broken chair lift  Has following equipment at home:  broken chair lift, 2 canes (keeps one in car)  OCCUPATION: retired   PLOF: Independent, Independent with basic ADLs, Independent with gait, and Independent with transfers  PATIENT GOALS: MD told me to come, need some help getting back into aerobics, improve balance   NEXT MD VISIT: Referring October 21st  OBJECTIVE:   PATIENT SURVEYS:  FOTO 2nd visit   COGNITION: Overall cognitive status: Within functional limits for tasks assessed     SENSATION: Not tested  LOWER EXTREMITY ROM:  Active ROM Left  eval Right  eval  Hip flexion    Hip extension    Hip abduction    Hip adduction    Hip internal rotation    Hip external rotation    Knee flexion 123* 127*  Knee extension -2* -4*  Ankle dorsiflexion 4* 10*  Ankle plantarflexion 60* 50*  Ankle inversion    Ankle eversion     (Blank rows = not tested)  LOWER EXTREMITY MMT:  MMT Right eval Left eval  Hip flexion 4 4-  Hip extension    Hip abduction 3+ 3+  Hip adduction    Hip internal rotation    Hip external rotation    Knee flexion 4+ 4-  Knee extension 4+ 4+  Ankle dorsiflexion 4 4  Ankle plantarflexion 2- 2  Ankle inversion 4+ 4+  Ankle eversion 4+ 4+   (Blank rows = not tested)  FUNCTIONAL TESTS:  Functional gait assessment: 12/30  LLE quite a bit longer than R- about 1/2 to 3/4 inch difference per observation, able to correct with MET but gait still "wobbly"   TODAY'S TREATMENT:                                                                                                                              DATE:  10/12/23 NuStep L5 x 6 minutes Ankle strengthening- isometric inversion with 5 sec holds, then eversion DF and PF against Red Tband Standing heel raise/toe raise  for balance with UUE support and a wall behind. Wall bumps with feet progressively further from wall.  Patient anxious, but able to perform with reps. Mini Squats  x 10 Side to side step with R tband resistance at knees, counter support x 10  Eval 09/30/22 Discussed and practiced HEP  PATIENT EDUCATION:  Education details: exam findings, POC, HEP, encouraged use of SPC, discussed heel lift  Person educated: Patient Education method: Explanation, Demonstration, and Handouts Education comprehension: verbalized understanding, returned demonstration, and needs further education  HOME EXERCISE PROGRAM: Access Code: [email or web address] URL: [email or web address] Date: 09/30/2022 Prepared by: Nedra Hai  Exercises - Sit to Stand with Resistance Around Legs  - 1-2 x daily - 7 x weekly - 1 sets - 10 reps - Hip Abduction with Resistance Loop  - 1-2 x daily - 7 x weekly - 1 sets - 10 reps - 1 second  hold - Tandem Stance in Corner  - 1-2 x daily - 7 x weekly - 1 sets - 6 reps - 15-30 seconds  hold  ASSESSMENT:  CLINICAL IMPRESSION: Patient reports a lot of stress due to her daughter's severe illness. She almost fell due to R ankle almost giving away. Updated HEP with additional exercises for ankle and LE strength and balance training.  OBJECTIVE IMPAIRMENTS: Abnormal gait, decreased activity tolerance, decreased balance, decreased mobility, difficulty walking, decreased ROM, decreased strength, impaired flexibility, postural dysfunction, and pain.   ACTIVITY LIMITATIONS: standing, squatting, stairs, transfers, and locomotion level  PARTICIPATION LIMITATIONS: driving, shopping, community activity, and yard work  PERSONAL FACTORS: Age, Behavior pattern, Fitness, Past/current experiences, Social background, and Time since onset of injury/illness/exacerbation are also affecting patient's functional outcome.   REHAB POTENTIAL: Good  CLINICAL DECISION MAKING:  Stable/uncomplicated  EVALUATION COMPLEXITY: Low   GOALS: Goals reviewed with patient? Yes  SHORT TERM GOALS: Target date: 10/21/2022   Will be compliant with appropriate progressive HEP  Baseline: Goal status: 10/12/22 ongoing  2.  AROM in B ankles to be equal and symmetrical  Baseline:  Goal status: 10/12/22-met  3.  Will be able to make sharp turns quickly with minimal loss of balance, no more than MinA for safety  Baseline:  Goal status: INITIAL    LONG TERM GOALS: Target date: 11/11/2022    MMT to improve by at least one grade in all weak groups  Baseline:  Goal status: INITIAL  2.  Will score at least 20 on FGA to show improved balance/reduced fall risk  Baseline:  Goal status: INITIAL  3.  Will be compliant with appropriate balanced exercise program to include flexibility work, balance, cardio, and strength  Baseline:  Goal status: INITIAL     PLAN:  PT FREQUENCY: 2x/week  PT DURATION: 6 weeks  PLANNED INTERVENTIONS: Therapeutic exercises, Therapeutic activity, Neuromuscular re-education, Gait training, Self Care, Cryotherapy, Moist heat, Manual therapy, and Re-evaluation  PLAN FOR NEXT SESSION: ankle ROM, general strength and balance training; encourage shoe lift   Nedra Hai, PT, DPT 10/12/22 10:13 AM

## 2022-10-15 ENCOUNTER — Ambulatory Visit (INDEPENDENT_AMBULATORY_CARE_PROVIDER_SITE_OTHER): Payer: 59 | Admitting: Gastroenterology

## 2022-10-15 ENCOUNTER — Other Ambulatory Visit: Payer: 59

## 2022-10-15 ENCOUNTER — Encounter: Payer: Self-pay | Admitting: Gastroenterology

## 2022-10-15 VITALS — BP 132/76 | HR 94 | Ht 63.0 in | Wt 157.2 lb

## 2022-10-15 DIAGNOSIS — R159 Full incontinence of feces: Secondary | ICD-10-CM

## 2022-10-15 NOTE — Patient Instructions (Addendum)
We have scheduled you a follow up on 01/05/2023 at 3:00pm.   Please start using fiber daily with 16 ox of water.  _______________________________________________________  If your blood pressure at your visit was 140/90 or greater, please contact your primary care physician to follow up on this.  _______________________________________________________  If you are age 78 or older, your body mass index should be between 23-30. Your Body mass index is 27.85 kg/m. If this is out of the aforementioned range listed, please consider follow up with your Primary Care Provider.  If you are age 50 or younger, your body mass index should be between 19-25. Your Body mass index is 27.85 kg/m. If this is out of the aformentioned range listed, please consider follow up with your Primary Care Provider.   ________________________________________________________  The [facility] providers would like to encourage you to use [facility] to communicate with providers for non-urgent requests or questions.  Due to long hold times on the telephone, sending your provider a message by [facility] may be a faster and more efficient way to get a response.  Please allow 48 business hours for a response.  Please remember that this is for non-urgent requests.  _______________________________________________________ It was a pleasure to see you today!  Thank you for trusting me with your gastrointestinal care!

## 2022-10-15 NOTE — Progress Notes (Signed)
Chief Complaint: Fecal incontinence Primary GI MD: Dr. Christella Hartigan  HPI: 77 year old female history of hypertension, COPD, diabetes, and others as listed below presents for evaluation of  fecal incontinence.  Echocardiogram 03/2019 with ejection fraction 65 to 70%  Patient states over the last 6 months she has been having fecal incontinence about 2-3 times per day.  Notes she will have it at nighttime and at times of stress.  Reports having soft formed bowel movements without straining.  Denies melena/hematochezia.  Denies urinary symptoms.    Patient was seen by PCP and is set up to undergo pelvic floor physical therapy October 1.  Denies nausea/vomiting.  Denies abdominal pain.  She does report intermittent fluctuations in weight over this last year.  States she kept her 26-year-old daughter during the summer and during that time she lost 10 pounds.  She was then gaining the weight back but her daughter recently was hospitalized for CHF exacerbation in [place] which caused her a lot of stress and she lost the 10 pounds again and is beginning to gain it back.  Last colonoscopy was in 2014 which was normal with a 10-year repeat.  Denies family history of colon cancer.  Patient is not interested in a colonoscopy and would like to avoid if possible.  PREVIOUS GI WORKUP   Colonoscopy 05/2012 - Numerous small diverticula in left colon - Otherwise normal - Repeat 10 years  Past Medical History:  Diagnosis Date   Arthritis    Bursitis    left elbow   Clostridium difficile infection    Diabetes mellitus without complication (HCC)    Frequent falls    HLD (hyperlipidemia)    Hypertension    Infectious colitis     Past Surgical History:  Procedure Laterality Date   CATARACT EXTRACTION Bilateral    COLONOSCOPY     ORIF PROXIMAL TIBIAL PLATEAU FRACTURE Left 02/19/2012   knee   POLYPECTOMY     TONSILLECTOMY      Current Outpatient Medications  Medication Sig Dispense Refill    ALPRAZolam (XANAX) 0.5 MG tablet TAKE 1 TABLET(0.5 MG) BY MOUTH EVERY DAY 30 tablet 2   amLODipine (NORVASC) 10 MG tablet      atorvastatin (LIPITOR) 40 MG tablet TAKE 1 TABLET(40 MG) BY MOUTH DAILY. FOLLOW-UP APPOINTMENT WITH LABS ARE DUE 90 tablet 0   cholecalciferol (VITAMIN D) 25 MCG (1000 UNIT) tablet Take 2,000 Units by mouth daily.     Cyanocobalamin (VITAMIN B-12) 2500 MCG SUBL Place 2,500 mcg under the tongue daily.     denosumab (PROLIA) 60 MG/ML SOSY injection INJECT 60MG  INTO THE SKIN AS DIRECTED 1 mL 0   fluticasone (FLONASE) 50 MCG/ACT nasal spray Place 2 sprays into both nostrils daily. 16 g 6   gabapentin (NEURONTIN) 100 MG capsule Take 2 capsules (200 mg total) by mouth at bedtime as needed. 180 capsule 0   hydrochlorothiazide (HYDRODIURIL) 25 MG tablet TAKE 1 TABLET(25 MG) BY MOUTH EVERY MORNING 90 tablet 3   lisinopril (ZESTRIL) 40 MG tablet TAKE 1 TABLET(40 MG) BY MOUTH DAILY 90 tablet 2   MAGNESIUM PO Take by mouth.     potassium chloride (KLOR-CON) 10 MEQ tablet Take 10 mEq by mouth daily.     TURMERIC PO Take 1 tablet by mouth daily.     No current facility-administered medications for this visit.    Allergies as of 10/15/2022 - Review Complete 10/12/2022  Allergen Reaction Noted   Doxycycline Diarrhea 08/03/2011   Minocycline Diarrhea 08/03/2011  Wellbutrin [bupropion] Swelling 07/10/2014   Penicillins Rash 12/18/2010   Z-pak [azithromycin] Other (See Comments) 02/12/2020    Family History  Problem Relation Age of Onset   Diverticulosis Mother    Ovarian cancer Mother    Pancreatic cancer Father    Lung cancer Father    Colon polyps Sister    Colon cancer Neg Hx    Breast cancer Neg Hx     Social History   Socioeconomic History   Marital status: Divorced    Spouse name: Not on file   Number of children: 2   Years of education: Not on file   Highest education level: Some college, no degree  Occupational History   Occupation: RETIRED  Tobacco Use    Smoking status: Former    Current packs/day: 0.00    Average packs/day: 0.3 packs/day for 50.0 years (12.5 ttl pk-yrs)    Types: Cigarettes    Start date: 08/27/1964    Quit date: 08/28/2014    Years since quitting: 8.1   Smokeless tobacco: Never   Tobacco comments:    1 pack every 3 days  Vaping Use   Vaping status: Never Used  Substance and Sexual Activity   Alcohol use: No    Alcohol/week: 0.0 standard drinks of alcohol   Drug use: No   Sexual activity: Not Currently  Other Topics Concern   Not on file  Social History Narrative   Lives alone    Right handed   Caffeine: 3x weekly         Social Determinants of Health   Financial Resource Strain: Low Risk  (08/26/2022)   Overall Financial Resource Strain (CARDIA)    Difficulty of Paying Living Expenses: Not hard at all  Food Insecurity: No Food Insecurity (08/26/2022)   Hunger Vital Sign    Worried About Running Out of Food in the Last Year: Never true    Ran Out of Food in the Last Year: Never true  Transportation Needs: No Transportation Needs (08/26/2022)   PRAPARE - Administrator, Civil Service (Medical): No    Lack of Transportation (Non-Medical): No  Physical Activity: Inactive (08/26/2022)   Exercise Vital Sign    Days of Exercise per Week: 0 days    Minutes of Exercise per Session: 0 min  Stress: No Stress Concern Present (08/26/2022)   [employer] of Occupational Health - Occupational Stress Questionnaire    Feeling of Stress : Only a little  Social Connections: Moderately Integrated (08/26/2022)   Social Connection and Isolation Panel [NHANES]    Frequency of Communication with Friends and Family: More than three times a week    Frequency of Social Gatherings with Friends and Family: Three times a week    Attends Religious Services: More than 4 times per year    Active Member of Clubs or Organizations: Yes    Attends Banker Meetings: More than 4 times per year    Marital Status:  Divorced  Intimate Partner Violence: Not At Risk (08/26/2022)   Humiliation, Afraid, Rape, and Kick questionnaire    Fear of Current or Ex-Partner: No    Emotionally Abused: No    Physically Abused: No    Sexually Abused: No    Review of Systems:    Constitutional: No weight loss, fever, chills, weakness or fatigue HEENT: Eyes: No change in vision               Ears, Nose, Throat:  No change in  hearing or congestion Skin: No rash or itching Cardiovascular: No chest pain, chest pressure or palpitations   Respiratory: No SOB or cough Gastrointestinal: See HPI and otherwise negative Genitourinary: No dysuria or change in urinary frequency Neurological: No headache, dizziness or syncope Musculoskeletal: No new muscle or joint pain Hematologic: No bleeding or bruising Psychiatric: No history of depression or anxiety    Physical Exam:  Vital signs: There were no vitals taken for this visit.  Constitutional: NAD, Well developed, Well nourished, alert and cooperative.  Appears significantly younger than stated age and in adequate health. Head:  Normocephalic and atraumatic. Eyes:   PEERL, EOMI. No icterus. Conjunctiva pink. Respiratory: Respirations even and unlabored. Lungs clear to auscultation bilaterally.   No wheezes, crackles, or rhonchi.  Cardiovascular:  Regular rate and rhythm. No peripheral edema, cyanosis or pallor.  Gastrointestinal:  Soft, nondistended, nontender. No rebound or guarding. Normal bowel sounds. No appreciable masses or hepatomegaly. Rectal: Irving Burton CMA chaperone.  No fissures/hemorrhoids.  Decreased rectal tone.  Soft impaction. Msk:  Symmetrical without gross deformities. Without edema, no deformity or joint abnormality.  Neurologic:  Alert and  oriented x4;  grossly normal neurologically.  Skin:   Dry and intact without significant lesions or rashes. Psychiatric: Oriented to person, place and time. Demonstrates good judgement and reason without abnormal affect  or behaviors.   RELEVANT LABS AND IMAGING: CBC    Component Value Date/Time   WBC 6.3 09/09/2022 0911   RBC 5.12 (H) 09/09/2022 0911   HGB 13.7 09/09/2022 0911   HCT 43.2 09/09/2022 0911   PLT 319.0 09/09/2022 0911   MCV 84.4 09/09/2022 0911   MCH 27.1 01/28/2022 1605   MCHC 31.7 09/09/2022 0911   RDW 15.7 (H) 09/09/2022 0911   LYMPHSABS 2.5 09/09/2022 0911   MONOABS 0.7 09/09/2022 0911   EOSABS 0.1 09/09/2022 0911   BASOSABS 0.1 09/09/2022 0911    CMP     Component Value Date/Time   NA 139 09/09/2022 0911   K 3.4 (L) 09/09/2022 0911   CL 101 09/09/2022 0911   CO2 30 09/09/2022 0911   GLUCOSE 97 09/09/2022 0911   BUN 17 09/09/2022 0911   CREATININE 0.80 09/09/2022 0911   CREATININE 0.73 10/20/2012 1145   CALCIUM 9.2 09/09/2022 0911   PROT 7.2 09/09/2022 0911   ALBUMIN 4.4 09/09/2022 0911   AST 19 09/09/2022 0911   ALT 25 09/09/2022 0911   ALKPHOS 45 09/09/2022 0911   BILITOT 0.4 09/09/2022 0911   GFRNONAA >60 01/28/2022 1605   GFRAA >60 04/18/2017 1431     Assessment/Plan:   Fecal incontinence Fecal incontinence for the last 6 months 2-3 times, also having nocturnal symptoms.  Having soft formed stools without difficulty and no other associated symptoms.  Decreased sphincter tone on rectal exam.  Discussed with patient that due to nocturnal symptoms I think it would be best for her to undergo a colonoscopy for further evaluation especially with her last 1 being 10 years ago.  Patient is very hesitant and would prefer to avoid colonoscopy.  We then decided that we will do a close follow-up in December and reevaluate after she undergoes pelvic floor physical therapy.  If no improvement with pelvic floor physical therapy patient states she is amenable to doing a colonoscopy.  - Follow-up in December after pelvic floor physical therapy - Recommend getting on a fiber supplement daily - Please do not hesitate to MyChart message in between now and then with any questions  or concerns or  if symptoms begin to worsen.  Weight loss Intermittent weight change secondary to stress.  If persistent weight loss will consider CT    Donz[facility] 10/15/2022, 8:32 AM  Cc: Pincus Sanes, MD

## 2022-10-18 NOTE — Progress Notes (Addendum)
I agree with the assessment and plan as outlined by Ms. McMichael. 

## 2022-10-21 ENCOUNTER — Ambulatory Visit: Payer: 59

## 2022-10-22 NOTE — Discharge Instructions (Signed)

## 2022-10-25 ENCOUNTER — Ambulatory Visit
Admission: RE | Admit: 2022-10-25 | Discharge: 2022-10-25 | Disposition: A | Discharge status: Home / Self Care | Payer: 59 | Source: Ambulatory Visit | Attending: Family Medicine | Admitting: Family Medicine

## 2022-10-25 DIAGNOSIS — M4727 Other spondylosis with radiculopathy, lumbosacral region: Secondary | ICD-10-CM | POA: Diagnosis not present

## 2022-10-25 DIAGNOSIS — M5416 Radiculopathy, lumbar region: Secondary | ICD-10-CM

## 2022-10-25 MED ORDER — IOPAMIDOL (ISOVUE-M 200) INJECTION 41%
1.0000 mL | Freq: Once | INTRAMUSCULAR | Status: AC
  Administered 2022-10-25: 1 mL via EPIDURAL

## 2022-10-25 MED ORDER — METHYLPREDNISOLONE ACETATE 40 MG/ML INJ SUSP (RADIOLOG
80.0000 mg | Freq: Once | INTRAMUSCULAR | Status: AC
  Administered 2022-10-25: 80 mg via EPIDURAL

## 2022-10-27 ENCOUNTER — Telehealth: Payer: Self-pay | Admitting: Internal Medicine

## 2022-10-27 DIAGNOSIS — R42 Dizziness and giddiness: Secondary | ICD-10-CM

## 2022-10-27 DIAGNOSIS — R269 Unspecified abnormalities of gait and mobility: Secondary | ICD-10-CM

## 2022-10-27 DIAGNOSIS — R296 Repeated falls: Secondary | ICD-10-CM

## 2022-10-27 NOTE — Telephone Encounter (Signed)
Patient called and said that the neurologist that she was referred to would not accept her because of debt.  Please send referral to someone else and let patient know.  Phone: [phone]

## 2022-10-28 DIAGNOSIS — L218 Other seborrheic dermatitis: Secondary | ICD-10-CM | POA: Diagnosis not present

## 2022-10-28 NOTE — Telephone Encounter (Signed)
New referral for neurology at [facility] ordered-I cannot guarantee that they will see her either.

## 2022-10-28 NOTE — Telephone Encounter (Signed)
Message left for patient today 

## 2022-11-02 ENCOUNTER — Other Ambulatory Visit: Payer: Self-pay

## 2022-11-02 ENCOUNTER — Ambulatory Visit: Payer: 59 | Attending: Obstetrics and Gynecology

## 2022-11-02 DIAGNOSIS — M5459 Other low back pain: Secondary | ICD-10-CM | POA: Insufficient documentation

## 2022-11-02 DIAGNOSIS — R279 Unspecified lack of coordination: Secondary | ICD-10-CM | POA: Diagnosis not present

## 2022-11-02 DIAGNOSIS — M6281 Muscle weakness (generalized): Secondary | ICD-10-CM | POA: Diagnosis not present

## 2022-11-02 DIAGNOSIS — M6289 Other specified disorders of muscle: Secondary | ICD-10-CM | POA: Insufficient documentation

## 2022-11-02 DIAGNOSIS — R151 Fecal smearing: Secondary | ICD-10-CM | POA: Diagnosis present

## 2022-11-02 NOTE — Therapy (Signed)
OUTPATIENT PHYSICAL THERAPY FEMALE PELVIC EVALUATION   Patient Name: Kristina Ayala MRN: [id number] DOB:1945/05/08, 77 y.o., female Today's Date: 11/02/2022  END OF SESSION:  PT End of Session - 11/02/22 1603     Visit Number 3   pelvic floor visit (evaluation)   Date for PT Re-Evaluation 12/28/22   for pelvic floor   Authorization Type [employer] MCR    Progress Note Due on Visit 10    PT Start Time 1600    PT Stop Time 1640    PT Time Calculation (min) 40 min    Activity Tolerance Patient tolerated treatment well    Behavior During Therapy WFL for tasks assessed/performed             Past Medical History:  Diagnosis Date   Arthritis    Bursitis    left elbow   Clostridium difficile infection    Diabetes mellitus without complication (HCC)    Frequent falls    HLD (hyperlipidemia)    Hypertension    Infectious colitis    Past Surgical History:  Procedure Laterality Date   CATARACT EXTRACTION Bilateral    COLONOSCOPY     KNEE SURGERY  2014   ORIF PROXIMAL TIBIAL PLATEAU FRACTURE Left 02/19/2012   knee   POLYPECTOMY     TONSILLECTOMY     Patient Active Problem List   Diagnosis Date Noted   Cold intolerance 09/09/2022   Gait abnormality 09/09/2022   Urge incontinence 05/13/2022   Right ovarian cyst 05/13/2022   Tibialis posterior tendinitis, right 04/29/2022   Palpitations 04/12/2022   Fecal smearing 04/12/2022   Foot injury, left, initial encounter 09/16/2021   Tingling 03/27/2021   Chronic fatigue 03/27/2021   Itchy scalp 12/09/2020   Excessive daytime sleepiness 11/03/2020   Coronary artery calcification seen on CAT scan 09/09/2020   Aortic atherosclerosis (HCC) 08/31/2020   Right rib fracture 07/10/2020   Closed low lateral malleolus fracture 07/10/2020   Snoring 05/23/2020   Cough 12/04/2019   Pancreatic mass 11/08/2019   DOE (dyspnea on exertion) 03/20/2019   Chronic left SI joint pain 03/13/2019   Osteoarthritis 01/10/2019   Right lower  quadrant abdominal pain 11/09/2018   Adnexal mass, right 11/09/2018   Lung nodules - stable - no f/u needed 06/19/2018   Thyromegaly 10/28/2017   Restless leg syndrome 10/12/2017   Greater trochanteric bursitis of left hip 07/26/2017   Hypokalemia 04/18/2017   Degenerative arthritis of left knee 12/01/2016   Anxiety 12/01/2016   Diabetes mellitus without complication (HCC) 09/16/2016   Bursitis of right shoulder 09/04/2015   Lateral meniscus derangement 06/03/2015   Tear of LCL (lateral collateral ligament) of knee 06/03/2015   Lumbar radiculopathy 09/19/2014   Low back pain 01/04/2014   Bilateral knee pain 01/04/2014   Diverticulosis of colon without hemorrhage 07/26/2013   Diarrhea 07/26/2013   Trigger thumb of left hand 03/06/2013   Essential hypertension, benign 02/15/2013   Hyperlipidemia 09/28/2012   COPD (chronic obstructive pulmonary disease) (HCC) 05/22/2012   Osteoporosis - on prolia 05/22/2012    PCP: Pincus Sanes, MD  REFERRING PROVIDER: Selmer Dominion, NP  REFERRING DIAG: R15.1 (ICD-10-CM) - Fecal smearing M62.89 (ICD-10-CM) - Pelvic floor dysfunction in female  THERAPY DIAG:  Muscle weakness (generalized)  Unspecified lack of coordination  Other low back pain  Rationale for Evaluation and Treatment: Rehabilitation  ONSET DATE: June 2024  SUBJECTIVE:  SUBJECTIVE STATEMENT: Pt having fecal incontinence and urinary urgency and incontinence. She initially thought this had to do with being anxious, but now she has been told there is some muscle weakness.  Fluid intake: Yes: cup of tea/coffee in the morning; 2-3 16oz bottles a day    PAIN:  Are you having pain? Yes NPRS scale: 0/10 current; after 30 minutes of standing 6/10 Pain location:  low back pain  Pain type:  pounding pain across low back/aches Pain description: intermittent   Aggravating factors: prolonged standing  Relieving factors: low back injection  PRECAUTIONS: None  RED FLAGS: None   WEIGHT BEARING RESTRICTIONS: No  FALLS:  Has patient fallen in last 6 months? Yes. Number of falls 3  LIVING ENVIRONMENT: Lives with: lives alone Lives in: House/apartment   OCCUPATION: front desk assistant  PLOF: Independent  PATIENT GOALS: strengthen pelvic floor; stop fecal incontinence   PERTINENT HISTORY:  G6P2; Rt ovarian cyst  BOWEL MOVEMENT: Pain with bowel movement: No Type of bowel movement:Type (Bristol Stool Scale) 5-6, Frequency 2x/day, and Strain Yes Fully empty rectum: No Leakage: Yes: when she is stressed or anxious - happens more often when she is in bed or sleeping  Pads: No Fiber supplement: Yes: she had been, but stopped taking it  URINATION: Pain with urination: No Fully empty bladder: Yes: - Stream: Strong Urgency: No Frequency: 2x/day, 1x/night Leakage:  none Pads: No  INTERCOURSE: Not sexually active   PREGNANCY: Vaginal deliveries 2 Tearing No C-section deliveries 0 Currently pregnant No  PROLAPSE: None   OBJECTIVE:  Note: Objective measures were completed at Evaluation unless otherwise noted. 11/02/22:  DIAGNOSTIC FINDINGS:  CT/US: ovarian cyst  COGNITION: Overall cognitive status: Within functional limits for tasks assessed     SENSATION: Light touch: Appears intact Proprioception: Appears intact  FUNCTIONAL TESTS:  Single leg stance:   Rt: unable, pelvic drop Lt: unable, pelvic drop  GAIT: Comments: flexed forward posture  POSTURE: rounded shoulders, forward head, decreased lumbar lordosis, increased thoracic kyphosis, posterior pelvic tilt, and Rt thoracic/Lt lumbar curvature ; Rt hip elevation   LUMBARAROM/PROM:  A/PROM A/PROM  Eval (% available)  Flexion 75  Extension 50  Right lateral flexion WNL  Left lateral  flexion WNL  Right rotation 50  Left rotation 75   (Blank rows = not tested)    PALPATION:   General  some restriction throughout abdomen, no tenderness                External Perineal Exam WNL                             Internal Pelvic Floor low tone; paradoxical contraction with bearing down/bulge  Patient confirms identification and approves PT to assess internal pelvic floor and treatment Yes  PELVIC MMT:   MMT eval  Vaginal NA  Internal Anal Sphincter 1/5  External Anal Sphincter 0-1/5  Puborectalis 1/5  Diastasis Recti 2 finger width separation with distortion upon increased abdominal pressure  (Blank rows = not tested)        TONE: low  PROLAPSE: NA  TODAY'S TREATMENT:  DATE:  11/02/22  EVAL  Neuromuscular re-education: Pt provides verbal consent for internal vaginal/rectal pelvic floor exam. Pelvic floor contraction training with internal rectal feedback Quick flicks Long holds  Therapeutic activities: Squatty potty Relaxed toilet mechanics  Self-bowel massage    PATIENT EDUCATION:  Education details: See above Person educated: Patient Education method: Programmer, multimedia, Demonstration, Tactile cues, Verbal cues, and Handouts Education comprehension: verbalized understanding  HOME EXERCISE PROGRAM: [email or web address]  ASSESSMENT:  CLINICAL IMPRESSION: Patient is a 77 y.o. female who was seen today for physical therapy evaluation and treatment for fecal incontinence and incomplete bowel movements. Exam findings notable for abnormal posture, decreased lumbar A/ROM, functional core weakness, decreased balance, decreased pelvic floor/external anal sphincter muscle strength, decreased coordination with repeat contraction and pelvic floor relaxation, decreased pelvic floor endurance, paradoxical pelvic floor contraction with bulge. Signs and  symptoms are most consistent with pelvic floor/external anal sphincter weakness, poor coordination of correct bulge with bowel movements, and incomplete evacuation. Initial treatment included pelvic floor contraction training and bulge training with internal rectal feedback, improved toilet mechanics, and self bowel massage. She will continue to benefit from skilled PT intervention in order to decrease fecal incontinence and perform pelvic floor/functional strengthening program.   OBJECTIVE IMPAIRMENTS: decreased activity tolerance, decreased coordination, decreased endurance, decreased strength, increased fascial restrictions, increased muscle spasms, impaired tone, postural dysfunction, and pain.   ACTIVITY LIMITATIONS: continence  PARTICIPATION LIMITATIONS: community activity  PERSONAL FACTORS: 1 comorbidity: G6P2  are also affecting patient's functional outcome.   REHAB POTENTIAL: Good  CLINICAL DECISION MAKING: Stable/uncomplicated  EVALUATION COMPLEXITY: Low   GOALS: Goals reviewed with patient? Yes  SHORT TERM GOALS: Target date: 11/30/22  Pt will be independent with HEP.   Baseline: Goal status: INITIAL  2.  Pt will be able to perform full pelvic floor muscle contraction with appropriate breath coordination and full relaxation.  Baseline:  Goal status: INITIAL  3.  Pt will be independent with use of squatty potty, relaxed toileting mechanics, and improved bowel movement techniques in order to increase ease of bowel movements and complete evacuation.   Baseline:  Goal status: INITIAL   LONG TERM GOALS: Target date: 12/28/22  Pt will be independent with advanced HEP.   Baseline:  Goal status: INITIAL  2.  Pt will demonstrate 3/5 external anal sphincter contraction in order to improve fecal incontinence.    Baseline:  Goal status: INITIAL  3.  Pt will report no more than 1 bowel movement a day without straining and complete evacuation.  Baseline:  Goal status:  INITIAL   PLAN:  PT FREQUENCY: 1-2x/week  PT DURATION: 8 weeks  PLANNED INTERVENTIONS: Therapeutic exercises, Therapeutic activity, Neuromuscular re-education, Balance training, Gait training, Patient/Family education, Self Care, Joint mobilization, Dry Needling, Biofeedback, and Manual therapy  PLAN FOR NEXT SESSION: Begin core training; bowel mobilization; mobility exercises' push training.    Julio Alm, PT, DPT10/08/245:08 PM

## 2022-11-02 NOTE — Therapy (Signed)
OUTPATIENT PHYSICAL THERAPY LOWER EXTREMITY TREATMENT   Patient Name: Kristina Ayala MRN: [id number] DOB:[birth year], 77 y.o., female Today's Date: 11/03/2022  END OF SESSION:  PT End of Session - 11/03/22 0930     Visit Number 4    Date for PT Re-Evaluation 12/28/22   for pelvic floor   Authorization Type [employer] MCR    Progress Note Due on Visit 10    PT Start Time 0930    PT Stop Time 1015    PT Time Calculation (min) 45 min    Activity Tolerance Patient tolerated treatment well    Behavior During Therapy WFL for tasks assessed/performed              Past Medical History:  Diagnosis Date   Arthritis    Bursitis    left elbow   Clostridium difficile infection    Diabetes mellitus without complication (HCC)    Frequent falls    HLD (hyperlipidemia)    Hypertension    Infectious colitis    Past Surgical History:  Procedure Laterality Date   CATARACT EXTRACTION Bilateral    COLONOSCOPY     KNEE SURGERY  2014   ORIF PROXIMAL TIBIAL PLATEAU FRACTURE Left 02/19/2012   knee   POLYPECTOMY     TONSILLECTOMY     Patient Active Problem List   Diagnosis Date Noted   Cold intolerance 09/09/2022   Gait abnormality 09/09/2022   Urge incontinence 05/13/2022   Right ovarian cyst 05/13/2022   Tibialis posterior tendinitis, right 04/29/2022   Palpitations 04/12/2022   Fecal smearing 04/12/2022   Foot injury, left, initial encounter 09/16/2021   Tingling 03/27/2021   Chronic fatigue 03/27/2021   Itchy scalp 12/09/2020   Excessive daytime sleepiness 11/03/2020   Coronary artery calcification seen on CAT scan 09/09/2020   Aortic atherosclerosis (HCC) 08/31/2020   Right rib fracture 07/10/2020   Closed low lateral malleolus fracture 07/10/2020   Snoring 05/23/2020   Cough 12/04/2019   Pancreatic mass 11/08/2019   DOE (dyspnea on exertion) 03/20/2019   Chronic left SI joint pain 03/13/2019   Osteoarthritis 01/10/2019   Right lower quadrant abdominal pain 11/09/2018    Adnexal mass, right 11/09/2018   Lung nodules - stable - no f/u needed 06/19/2018   Thyromegaly 10/28/2017   Restless leg syndrome 10/12/2017   Greater trochanteric bursitis of left hip 07/26/2017   Hypokalemia 04/18/2017   Degenerative arthritis of left knee 12/01/2016   Anxiety 12/01/2016   Diabetes mellitus without complication (HCC) 09/16/2016   Bursitis of right shoulder 09/04/2015   Lateral meniscus derangement 06/03/2015   Tear of LCL (lateral collateral ligament) of knee 06/03/2015   Lumbar radiculopathy 09/19/2014   Low back pain 01/04/2014   Bilateral knee pain 01/04/2014   Diverticulosis of colon without hemorrhage 07/26/2013   Diarrhea 07/26/2013   Trigger thumb of left hand 03/06/2013   Essential hypertension, benign 02/15/2013   Hyperlipidemia 09/28/2012   COPD (chronic obstructive pulmonary disease) (HCC) 05/22/2012   Osteoporosis - on prolia 05/22/2012    PCP: Cheryll Cockayne MD   REFERRING PROVIDER: Judi Saa, DO  REFERRING DIAG: [phone] (ICD-10-CM) - Acute pain of left knee M25.371 (ICD-10-CM) - Right ankle instability  THERAPY DIAG:  Muscle weakness (generalized)  Unspecified lack of coordination  Other low back pain  Chronic pain of left knee  Rationale for Evaluation and Treatment: Rehabilitation  ONSET DATE: L knee chronic, R ankle June/July 2024  SUBJECTIVE:   SUBJECTIVE STATEMENT: Feeling good, no pain.  PERTINENT HISTORY: OA, hx Cdiff, DM, frequent falls, HLD, HTN, L tibial ORIF 2014 PAIN:  Are you having pain?  "No problem with pain now"/10, at worst can get to 8/10 in my back especially and sometimes the knee   PRECAUTIONS: Fall  RED FLAGS: None   WEIGHT BEARING RESTRICTIONS: No  FALLS:  Has patient fallen in last 6 months? Yes. Number of falls 3- one turned on a windy day/lost balance and fell on her back, another turned after talking with neighbors and tripped on steps, third fall fell when pivoting/turning as well. No  FOF.    LIVING ENVIRONMENT: Lives with: lives alone Lives in: Other townhouse with chair lift but chair lift is broken Stairs:  3-4 STE with rails, flight inside home with broken chair lift  Has following equipment at home:  broken chair lift, 2 canes (keeps one in car)  OCCUPATION: retired   PLOF: Independent, Independent with basic ADLs, Independent with gait, and Independent with transfers  PATIENT GOALS: MD told me to come, need some help getting back into aerobics, improve balance   NEXT MD VISIT: Referring October 21st  OBJECTIVE:   PATIENT SURVEYS:  FOTO 2nd visit   COGNITION: Overall cognitive status: Within functional limits for tasks assessed     SENSATION: Not tested  LOWER EXTREMITY ROM:  Active ROM Left  eval Right  eval  Hip flexion    Hip extension    Hip abduction    Hip adduction    Hip internal rotation    Hip external rotation    Knee flexion 123* 127*  Knee extension -2* -4*  Ankle dorsiflexion 4* 10*  Ankle plantarflexion 60* 50*  Ankle inversion    Ankle eversion     (Blank rows = not tested)  LOWER EXTREMITY MMT:  MMT Right eval Left eval  Hip flexion 4 4-  Hip extension    Hip abduction 3+ 3+  Hip adduction    Hip internal rotation    Hip external rotation    Knee flexion 4+ 4-  Knee extension 4+ 4+  Ankle dorsiflexion 4 4  Ankle plantarflexion 2- 2  Ankle inversion 4+ 4+  Ankle eversion 4+ 4+   (Blank rows = not tested)  FUNCTIONAL TESTS:  Functional gait assessment: 12/30  LLE quite a bit longer than R- about 1/2 to 3/4 inch difference per observation, able to correct with MET but gait still "wobbly"   TODAY'S TREATMENT:                                                                                                                              DATE:  11/03/22 NuStep L5x39mins Calf raises 2x10 Toe raises 2x10 Step ups 6"- CGA  STS 2x10 Standing marches 2#  2# hip abd/ext 2x10 LAQ 2# 2x10 HS curls red 2x10 Seated  hip abd red band 2x10   10/12/23 NuStep L5 x 6 minutes Ankle strengthening- isometric inversion with 5  sec holds, then eversion DF and PF against Red Tband Standing heel raise/toe raise for balance with UUE support and a wall behind. Wall bumps with feet progressively further from wall.  Patient anxious, but able to perform with reps. Mini Squats x 10 Side to side step with R tband resistance at knees, counter support x 10  Eval 09/30/22 Discussed and practiced HEP  PATIENT EDUCATION:  Education details: exam findings, POC, HEP, encouraged use of SPC, discussed heel lift  Person educated: Patient Education method: Explanation, Demonstration, and Handouts Education comprehension: verbalized understanding, returned demonstration, and needs further education  HOME EXERCISE PROGRAM: Access Code: [email or web address] URL: [email or web address] Date: 09/30/2022 Prepared by: Nedra Hai  Exercises - Sit to Stand with Resistance Around Legs  - 1-2 x daily - 7 x weekly - 1 sets - 10 reps - Hip Abduction with Resistance Loop  - 1-2 x daily - 7 x weekly - 1 sets - 10 reps - 1 second  hold - Tandem Stance in Corner  - 1-2 x daily - 7 x weekly - 1 sets - 6 reps - 15-30 seconds  hold  ASSESSMENT:  CLINICAL IMPRESSION: Patient reports she is feeling good today, no pain. However, she also states that she does not really have time to focus on her pains due to stressing about her daughter's severe illness. Worked mostly on some light LE strengthening. Seems to tolerate session well with no complaints.    OBJECTIVE IMPAIRMENTS: Abnormal gait, decreased activity tolerance, decreased balance, decreased mobility, difficulty walking, decreased ROM, decreased strength, impaired flexibility, postural dysfunction, and pain.   ACTIVITY LIMITATIONS: standing, squatting, stairs, transfers, and locomotion level  PARTICIPATION LIMITATIONS: driving, shopping, community activity, and yard work  PERSONAL  FACTORS: Age, Behavior pattern, Fitness, Past/current experiences, Social background, and Time since onset of injury/illness/exacerbation are also affecting patient's functional outcome.   REHAB POTENTIAL: Good  CLINICAL DECISION MAKING: Stable/uncomplicated  EVALUATION COMPLEXITY: Low   GOALS: Goals reviewed with patient? Yes  SHORT TERM GOALS: Target date: 10/21/2022   Will be compliant with appropriate progressive HEP  Baseline: Goal status: 10/12/22 ongoing  2.  AROM in B ankles to be equal and symmetrical  Baseline:  Goal status: 10/12/22-met  3.  Will be able to make sharp turns quickly with minimal loss of balance, no more than MinA for safety  Baseline:  Goal status: INITIAL    LONG TERM GOALS: Target date: 11/11/2022    MMT to improve by at least one grade in all weak groups  Baseline:  Goal status: INITIAL  2.  Will score at least 20 on FGA to show improved balance/reduced fall risk  Baseline:  Goal status: INITIAL  3.  Will be compliant with appropriate balanced exercise program to include flexibility work, balance, cardio, and strength  Baseline:  Goal status: INITIAL     PLAN:  PT FREQUENCY: 2x/week  PT DURATION: 6 weeks  PLANNED INTERVENTIONS: Therapeutic exercises, Therapeutic activity, Neuromuscular re-education, Gait training, Self Care, Cryotherapy, Moist heat, Manual therapy, and Re-evaluation  PLAN FOR NEXT SESSION: ankle ROM, general strength and balance training; encourage shoe lift   Cassie Freer, PT, DPT 11/03/22 10:12 AM

## 2022-11-02 NOTE — Patient Instructions (Signed)
Squatty potty: When your knees are level or below the level of your hips, pelvic floor muscles are pressed against rectum, preventing ease of bowel movement. By getting knees above the level of the hips, these pelvic floor muscles relax, allowing easier passage of bowel movement.  Ways to get knees above hips: o Squatty Potty (7inch and 9inch versions) o Small stool o Roll of toilet paper under each foot o Hardback book or stack of magazines under each foot  Relaxed Toileting mechanics: Once in this position, make sure to lean forward with forearms on thighs, wide knees, relaxed stomach, and breathe.    Bowel massage: To assist with more regular and more comfortable bowel movements, try performing bowel massage nightly for 5-10 minutes. Place hands in the lower right side of your abdomen to start; in small circles, massage up, across, and down the left side of your abdomen. Pressure does not need to be hard, but just comfortable. You can use lotion or oil to make more comfortable.      [facility] [address], [place], [place] [address] Phone # [phone] Fax [phone]

## 2022-11-03 ENCOUNTER — Ambulatory Visit: Payer: 59 | Attending: Family Medicine

## 2022-11-03 DIAGNOSIS — M6281 Muscle weakness (generalized): Secondary | ICD-10-CM | POA: Diagnosis not present

## 2022-11-03 DIAGNOSIS — M25562 Pain in left knee: Secondary | ICD-10-CM | POA: Diagnosis not present

## 2022-11-03 DIAGNOSIS — R29898 Other symptoms and signs involving the musculoskeletal system: Secondary | ICD-10-CM | POA: Diagnosis not present

## 2022-11-03 DIAGNOSIS — R2681 Unsteadiness on feet: Secondary | ICD-10-CM | POA: Diagnosis not present

## 2022-11-03 DIAGNOSIS — G8929 Other chronic pain: Secondary | ICD-10-CM | POA: Insufficient documentation

## 2022-11-03 DIAGNOSIS — R279 Unspecified lack of coordination: Secondary | ICD-10-CM | POA: Insufficient documentation

## 2022-11-03 DIAGNOSIS — M5459 Other low back pain: Secondary | ICD-10-CM | POA: Diagnosis not present

## 2022-11-03 DIAGNOSIS — M25571 Pain in right ankle and joints of right foot: Secondary | ICD-10-CM | POA: Diagnosis not present

## 2022-11-04 NOTE — Therapy (Signed)
OUTPATIENT PHYSICAL THERAPY LOWER EXTREMITY TREATMENT   Patient Name: Kristina Ayala MRN: [id number] DOB:1945-03-20, 77 y.o., female Today's Date: 11/05/2022  END OF SESSION:  PT End of Session - 11/05/22 0758     Visit Number 5    Date for PT Re-Evaluation 12/28/22   for pelvic floor   Authorization Type [employer] MCR    Progress Note Due on Visit 10    PT Start Time 0800    PT Stop Time 0845    PT Time Calculation (min) 45 min    Activity Tolerance Patient tolerated treatment well    Behavior During Therapy WFL for tasks assessed/performed               Past Medical History:  Diagnosis Date   Arthritis    Bursitis    left elbow   Clostridium difficile infection    Diabetes mellitus without complication (HCC)    Frequent falls    HLD (hyperlipidemia)    Hypertension    Infectious colitis    Past Surgical History:  Procedure Laterality Date   CATARACT EXTRACTION Bilateral    COLONOSCOPY     KNEE SURGERY  2014   ORIF PROXIMAL TIBIAL PLATEAU FRACTURE Left 02/19/2012   knee   POLYPECTOMY     TONSILLECTOMY     Patient Active Problem List   Diagnosis Date Noted   Cold intolerance 09/09/2022   Gait abnormality 09/09/2022   Urge incontinence 05/13/2022   Right ovarian cyst 05/13/2022   Tibialis posterior tendinitis, right 04/29/2022   Palpitations 04/12/2022   Fecal smearing 04/12/2022   Foot injury, left, initial encounter 09/16/2021   Tingling 03/27/2021   Chronic fatigue 03/27/2021   Itchy scalp 12/09/2020   Excessive daytime sleepiness 11/03/2020   Coronary artery calcification seen on CAT scan 09/09/2020   Aortic atherosclerosis (HCC) 08/31/2020   Right rib fracture 07/10/2020   Closed low lateral malleolus fracture 07/10/2020   Snoring 05/23/2020   Cough 12/04/2019   Pancreatic mass 11/08/2019   DOE (dyspnea on exertion) 03/20/2019   Chronic left SI joint pain 03/13/2019   Osteoarthritis 01/10/2019   Right lower quadrant abdominal pain  11/09/2018   Adnexal mass, right 11/09/2018   Lung nodules - stable - no f/u needed 06/19/2018   Thyromegaly 10/28/2017   Restless leg syndrome 10/12/2017   Greater trochanteric bursitis of left hip 07/26/2017   Hypokalemia 04/18/2017   Degenerative arthritis of left knee 12/01/2016   Anxiety 12/01/2016   Diabetes mellitus without complication (HCC) 09/16/2016   Bursitis of right shoulder 09/04/2015   Lateral meniscus derangement 06/03/2015   Tear of LCL (lateral collateral ligament) of knee 06/03/2015   Lumbar radiculopathy 09/19/2014   Low back pain 01/04/2014   Bilateral knee pain 01/04/2014   Diverticulosis of colon without hemorrhage 07/26/2013   Diarrhea 07/26/2013   Trigger thumb of left hand 03/06/2013   Essential hypertension, benign 02/15/2013   Hyperlipidemia 09/28/2012   COPD (chronic obstructive pulmonary disease) (HCC) 05/22/2012   Osteoporosis - on prolia 05/22/2012    PCP: Cheryll Cockayne MD   REFERRING PROVIDER: Judi Saa, DO  REFERRING DIAG: [phone] (ICD-10-CM) - Acute pain of left knee M25.371 (ICD-10-CM) - Right ankle instability  THERAPY DIAG:  Muscle weakness (generalized)  Unspecified lack of coordination  Other low back pain  Chronic pain of left knee  Rationale for Evaluation and Treatment: Rehabilitation  ONSET DATE: L knee chronic, R ankle June/July 2024  SUBJECTIVE:   SUBJECTIVE STATEMENT: Feeling good, just cold.  PERTINENT HISTORY: OA, hx Cdiff, DM, frequent falls, HLD, HTN, L tibial ORIF 2014 PAIN:  Are you having pain?  "No problem with pain now"/10, at worst can get to 8/10 in my back especially and sometimes the knee   PRECAUTIONS: Fall  RED FLAGS: None   WEIGHT BEARING RESTRICTIONS: No  FALLS:  Has patient fallen in last 6 months? Yes. Number of falls 3- one turned on a windy day/lost balance and fell on her back, another turned after talking with neighbors and tripped on steps, third fall fell when pivoting/turning  as well. No FOF.    LIVING ENVIRONMENT: Lives with: lives alone Lives in: Other townhouse with chair lift but chair lift is broken Stairs:  3-4 STE with rails, flight inside home with broken chair lift  Has following equipment at home:  broken chair lift, 2 canes (keeps one in car)  OCCUPATION: retired   PLOF: Independent, Independent with basic ADLs, Independent with gait, and Independent with transfers  PATIENT GOALS: MD told me to come, need some help getting back into aerobics, improve balance   NEXT MD VISIT: Referring October 21st  OBJECTIVE:   PATIENT SURVEYS:  FOTO 2nd visit   COGNITION: Overall cognitive status: Within functional limits for tasks assessed     SENSATION: Not tested  LOWER EXTREMITY ROM:  Active ROM Left  eval Right  eval  Hip flexion    Hip extension    Hip abduction    Hip adduction    Hip internal rotation    Hip external rotation    Knee flexion 123* 127*  Knee extension -2* -4*  Ankle dorsiflexion 4* 10*  Ankle plantarflexion 60* 50*  Ankle inversion    Ankle eversion     (Blank rows = not tested)  LOWER EXTREMITY MMT:  MMT Right eval Left eval  Hip flexion 4 4-  Hip extension    Hip abduction 3+ 3+  Hip adduction    Hip internal rotation    Hip external rotation    Knee flexion 4+ 4-  Knee extension 4+ 4+  Ankle dorsiflexion 4 4  Ankle plantarflexion 2- 2  Ankle inversion 4+ 4+  Ankle eversion 4+ 4+   (Blank rows = not tested)  FUNCTIONAL TESTS:  Functional gait assessment: 12/30  LLE quite a bit longer than R- about 1/2 to 3/4 inch difference per observation, able to correct with MET but gait still "wobbly"   TODAY'S TREATMENT:                                                                                                                              DATE:  11/05/22 NuStep L5x48mins  Leg ext 5# 2x10 HS curls 15# 2x10  STS with ball 2x10 Lateral band steps with green band HHA  Walking on beam On airex cone  taps in bars  Leg press 20# 2x10   11/03/22 NuStep L5x50mins Calf raises 2x10 Toe raises 2x10 Step  ups 6"- CGA  STS 2x10 Standing marches 2#  2# hip abd/ext 2x10 LAQ 2# 2x10 HS curls red 2x10 Seated hip abd red band 2x10   10/12/23 NuStep L5 x 6 minutes Ankle strengthening- isometric inversion with 5 sec holds, then eversion DF and PF against Red Tband Standing heel raise/toe raise for balance with UUE support and a wall behind. Wall bumps with feet progressively further from wall.  Patient anxious, but able to perform with reps. Mini Squats x 10 Side to side step with R tband resistance at knees, counter support x 10  Eval 09/30/22 Discussed and practiced HEP  PATIENT EDUCATION:  Education details: exam findings, POC, HEP, encouraged use of SPC, discussed heel lift  Person educated: Patient Education method: Explanation, Demonstration, and Handouts Education comprehension: verbalized understanding, returned demonstration, and needs further education  HOME EXERCISE PROGRAM: Access Code: [email or web address] URL: [email or web address] Date: 09/30/2022 Prepared by: Nedra Hai  Exercises - Sit to Stand with Resistance Around Legs  - 1-2 x daily - 7 x weekly - 1 sets - 10 reps - Hip Abduction with Resistance Loop  - 1-2 x daily - 7 x weekly - 1 sets - 10 reps - 1 second  hold - Tandem Stance in Corner  - 1-2 x daily - 7 x weekly - 1 sets - 6 reps - 15-30 seconds  hold  ASSESSMENT:  CLINICAL IMPRESSION: Patient reports she is feeling good today, some pain in L knee today. Continued to work on some light LE strengthening. She had some difficulty with STS holding the ball, had to elevate mat table. Seems to tolerate session well with no complaints. Unsteady with balance on airex.    OBJECTIVE IMPAIRMENTS: Abnormal gait, decreased activity tolerance, decreased balance, decreased mobility, difficulty walking, decreased ROM, decreased strength, impaired flexibility, postural  dysfunction, and pain.   ACTIVITY LIMITATIONS: standing, squatting, stairs, transfers, and locomotion level  PARTICIPATION LIMITATIONS: driving, shopping, community activity, and yard work  PERSONAL FACTORS: Age, Behavior pattern, Fitness, Past/current experiences, Social background, and Time since onset of injury/illness/exacerbation are also affecting patient's functional outcome.   REHAB POTENTIAL: Good  CLINICAL DECISION MAKING: Stable/uncomplicated  EVALUATION COMPLEXITY: Low   GOALS: Goals reviewed with patient? Yes  SHORT TERM GOALS: Target date: 10/21/2022   Will be compliant with appropriate progressive HEP  Baseline: Goal status: 10/12/22 ongoing  2.  AROM in B ankles to be equal and symmetrical  Baseline:  Goal status: 10/12/22-met  3.  Will be able to make sharp turns quickly with minimal loss of balance, no more than MinA for safety  Baseline:  Goal status: INITIAL    LONG TERM GOALS: Target date: 11/11/2022    MMT to improve by at least one grade in all weak groups  Baseline:  Goal status: INITIAL  2.  Will score at least 20 on FGA to show improved balance/reduced fall risk  Baseline:  Goal status: INITIAL  3.  Will be compliant with appropriate balanced exercise program to include flexibility work, balance, cardio, and strength  Baseline:  Goal status: INITIAL     PLAN:  PT FREQUENCY: 2x/week  PT DURATION: 6 weeks  PLANNED INTERVENTIONS: Therapeutic exercises, Therapeutic activity, Neuromuscular re-education, Gait training, Self Care, Cryotherapy, Moist heat, Manual therapy, and Re-evaluation  PLAN FOR NEXT SESSION: ankle ROM, general strength and balance training; encourage shoe lift   Cassie Freer, PT, DPT 11/05/22 8:43 AM

## 2022-11-05 ENCOUNTER — Ambulatory Visit: Payer: 59

## 2022-11-05 DIAGNOSIS — M25571 Pain in right ankle and joints of right foot: Secondary | ICD-10-CM | POA: Diagnosis not present

## 2022-11-05 DIAGNOSIS — M5459 Other low back pain: Secondary | ICD-10-CM | POA: Diagnosis not present

## 2022-11-05 DIAGNOSIS — R2681 Unsteadiness on feet: Secondary | ICD-10-CM | POA: Diagnosis not present

## 2022-11-05 DIAGNOSIS — G8929 Other chronic pain: Secondary | ICD-10-CM

## 2022-11-05 DIAGNOSIS — M6281 Muscle weakness (generalized): Secondary | ICD-10-CM | POA: Diagnosis not present

## 2022-11-05 DIAGNOSIS — R29898 Other symptoms and signs involving the musculoskeletal system: Secondary | ICD-10-CM | POA: Diagnosis not present

## 2022-11-05 DIAGNOSIS — M25562 Pain in left knee: Secondary | ICD-10-CM | POA: Diagnosis not present

## 2022-11-05 DIAGNOSIS — R279 Unspecified lack of coordination: Secondary | ICD-10-CM | POA: Diagnosis not present

## 2022-11-08 ENCOUNTER — Ambulatory Visit: Payer: 59 | Admitting: Physical Therapy

## 2022-11-08 ENCOUNTER — Ambulatory Visit: Payer: 59

## 2022-11-08 DIAGNOSIS — R279 Unspecified lack of coordination: Secondary | ICD-10-CM

## 2022-11-08 DIAGNOSIS — R151 Fecal smearing: Secondary | ICD-10-CM | POA: Diagnosis not present

## 2022-11-08 DIAGNOSIS — M6289 Other specified disorders of muscle: Secondary | ICD-10-CM | POA: Diagnosis not present

## 2022-11-08 DIAGNOSIS — M6281 Muscle weakness (generalized): Secondary | ICD-10-CM

## 2022-11-08 DIAGNOSIS — M5459 Other low back pain: Secondary | ICD-10-CM | POA: Diagnosis not present

## 2022-11-08 NOTE — Therapy (Signed)
OUTPATIENT PHYSICAL THERAPY FEMALE PELVIC EVALUATION   Patient Name: Kristina Ayala MRN: [id number] DOB:Jul 12, 1945, 77 y.o., female Today's Date: 11/08/2022  END OF SESSION:  PT End of Session - 11/08/22 1612     Visit Number 6   pf   Date for PT Re-Evaluation 12/28/22    Authorization Type [employer] MCR    Progress Note Due on Visit 10    PT Start Time 1615    PT Stop Time 1655    PT Time Calculation (min) 40 min    Activity Tolerance Patient tolerated treatment well    Behavior During Therapy WFL for tasks assessed/performed              Past Medical History:  Diagnosis Date   Arthritis    Bursitis    left elbow   Clostridium difficile infection    Diabetes mellitus without complication (HCC)    Frequent falls    HLD (hyperlipidemia)    Hypertension    Infectious colitis    Past Surgical History:  Procedure Laterality Date   CATARACT EXTRACTION Bilateral    COLONOSCOPY     KNEE SURGERY  2014   ORIF PROXIMAL TIBIAL PLATEAU FRACTURE Left 02/19/2012   knee   POLYPECTOMY     TONSILLECTOMY     Patient Active Problem List   Diagnosis Date Noted   Cold intolerance 09/09/2022   Gait abnormality 09/09/2022   Urge incontinence 05/13/2022   Right ovarian cyst 05/13/2022   Tibialis posterior tendinitis, right 04/29/2022   Palpitations 04/12/2022   Fecal smearing 04/12/2022   Foot injury, left, initial encounter 09/16/2021   Tingling 03/27/2021   Chronic fatigue 03/27/2021   Itchy scalp 12/09/2020   Excessive daytime sleepiness 11/03/2020   Coronary artery calcification seen on CAT scan 09/09/2020   Aortic atherosclerosis (HCC) 08/31/2020   Right rib fracture 07/10/2020   Closed low lateral malleolus fracture 07/10/2020   Snoring 05/23/2020   Cough 12/04/2019   Pancreatic mass 11/08/2019   DOE (dyspnea on exertion) 03/20/2019   Chronic left SI joint pain 03/13/2019   Osteoarthritis 01/10/2019   Right lower quadrant abdominal pain 11/09/2018   Adnexal  mass, right 11/09/2018   Lung nodules - stable - no f/u needed 06/19/2018   Thyromegaly 10/28/2017   Restless leg syndrome 10/12/2017   Greater trochanteric bursitis of left hip 07/26/2017   Hypokalemia 04/18/2017   Degenerative arthritis of left knee 12/01/2016   Anxiety 12/01/2016   Diabetes mellitus without complication (HCC) 09/16/2016   Bursitis of right shoulder 09/04/2015   Lateral meniscus derangement 06/03/2015   Tear of LCL (lateral collateral ligament) of knee 06/03/2015   Lumbar radiculopathy 09/19/2014   Low back pain 01/04/2014   Bilateral knee pain 01/04/2014   Diverticulosis of colon without hemorrhage 07/26/2013   Diarrhea 07/26/2013   Trigger thumb of left hand 03/06/2013   Essential hypertension, benign 02/15/2013   Hyperlipidemia 09/28/2012   COPD (chronic obstructive pulmonary disease) (HCC) 05/22/2012   Osteoporosis - on prolia 05/22/2012    PCP: Pincus Sanes, MD  REFERRING PROVIDER: Selmer Dominion, NP  REFERRING DIAG: R15.1 (ICD-10-CM) - Fecal smearing M62.89 (ICD-10-CM) - Pelvic floor dysfunction in female  THERAPY DIAG:  Muscle weakness (generalized)  Unspecified lack of coordination  Other low back pain  Rationale for Evaluation and Treatment: Rehabilitation  ONSET DATE: June 2024  SUBJECTIVE:  SUBJECTIVE STATEMENT: Pt states that she was very uncomfortable during her exam last treatment session and it was much worse than any exam that she has had at the gynecologist. She has done exercises 1x.   PAIN:  Are you having pain? Yes NPRS scale: 0/10 current; after 30 minutes of standing 6/10 Pain location:  low back pain  Pain type: pounding pain across low back/aches Pain description: intermittent   Aggravating factors: prolonged standing  Relieving  factors: low back injection  PRECAUTIONS: None  RED FLAGS: None   WEIGHT BEARING RESTRICTIONS: No  FALLS:  Has patient fallen in last 6 months? Yes. Number of falls 3  LIVING ENVIRONMENT: Lives with: lives alone Lives in: House/apartment   OCCUPATION: front desk assistant  PLOF: Independent  PATIENT GOALS: strengthen pelvic floor; stop fecal incontinence   PERTINENT HISTORY:  G6P2; Rt ovarian cyst  BOWEL MOVEMENT: Pain with bowel movement: No Type of bowel movement:Type (Bristol Stool Scale) 5-6, Frequency 2x/day, and Strain Yes Fully empty rectum: No Leakage: Yes: when she is stressed or anxious - happens more often when she is in bed or sleeping  Pads: No Fiber supplement: Yes: she had been, but stopped taking it  URINATION: Pain with urination: No Fully empty bladder: Yes: - Stream: Strong Urgency: No Frequency: 2x/day, 1x/night Leakage:  none Pads: No  INTERCOURSE: Not sexually active   PREGNANCY: Vaginal deliveries 2 Tearing No C-section deliveries 0 Currently pregnant No  PROLAPSE: None   OBJECTIVE:  Note: Objective measures were completed at Evaluation unless otherwise noted. 11/02/22:  DIAGNOSTIC FINDINGS:  CT/US: ovarian cyst  COGNITION: Overall cognitive status: Within functional limits for tasks assessed     SENSATION: Light touch: Appears intact Proprioception: Appears intact  FUNCTIONAL TESTS:  Single leg stance:   Rt: unable, pelvic drop Lt: unable, pelvic drop  GAIT: Comments: flexed forward posture  POSTURE: rounded shoulders, forward head, decreased lumbar lordosis, increased thoracic kyphosis, posterior pelvic tilt, and Rt thoracic/Lt lumbar curvature ; Rt hip elevation   LUMBARAROM/PROM:  A/PROM A/PROM  Eval (% available)  Flexion 75  Extension 50  Right lateral flexion WNL  Left lateral flexion WNL  Right rotation 50  Left rotation 75   (Blank rows = not tested)    PALPATION:   General  some restriction  throughout abdomen, no tenderness                External Perineal Exam WNL                             Internal Pelvic Floor low tone; paradoxical contraction with bearing down/bulge  Patient confirms identification and approves PT to assess internal pelvic floor and treatment Yes  PELVIC MMT:   MMT eval  Vaginal NA  Internal Anal Sphincter 1/5  External Anal Sphincter 0-1/5  Puborectalis 1/5  Diastasis Recti 2 finger width separation with distortion upon increased abdominal pressure  (Blank rows = not tested)        TONE: low  PROLAPSE: NA  TODAY'S TREATMENT:  DATE:  11/08/22 Neuromuscular re-education: Pelvic floor strengthening program review Supine hip adduction with pelvic floor muscle and transversus abdominus contraction 2 x 10 Supine bridge with pelvic floor muscle and transversus abdominus contraction 2 x 10 Seated hip adduction with pelvic floor muscle and transversus abdominus contraction 2 x 10 Seated hip abduction red band with pelvic floor muscle and transversus abdominus contraction 2 x 10 Seated march red band with pelvic floor muscle and transversus abdominus contraction 2 x 10 Exercises: Lower trunk rotation 3 x 10  11/02/22  EVAL  Neuromuscular re-education: Pt provides verbal consent for internal vaginal/rectal pelvic floor exam. Pelvic floor contraction training with internal rectal feedback Quick flicks Long holds  Therapeutic activities: Squatty potty Relaxed toilet mechanics  Self-bowel massage    PATIENT EDUCATION:  Education details: See above Person educated: Patient Education method: Programmer, multimedia, Demonstration, Tactile cues, Verbal cues, and Handouts Education comprehension: verbalized understanding  HOME EXERCISE PROGRAM: [email or web address]  ASSESSMENT:  CLINICAL IMPRESSION: Pt unfortunately had very had  experience with her initial visit due to pain with rectal exam of pelvic floor muscles. She inquired how this therapy is going to help with her issues of fecal incontinence, primarily at night. We discussed at length where her specific weakness is located and how to incorporate pelvic floor strengthening into other exercises in order to begin seeing some progress. She was encouraged that we never have to do anything she is uncomfortable with. She was agreeable to continue treatment at this time. She did well with hip/core/pelvic floor exercise progressions today. We also  gave several mobility activities in order to help reduce soreness that she is experiencing in Rt hip after PT. HEP updated. She will continue to benefit from skilled PT intervention in order to decrease fecal incontinence and perform pelvic floor/functional strengthening program.   OBJECTIVE IMPAIRMENTS: decreased activity tolerance, decreased coordination, decreased endurance, decreased strength, increased fascial restrictions, increased muscle spasms, impaired tone, postural dysfunction, and pain.   ACTIVITY LIMITATIONS: continence  PARTICIPATION LIMITATIONS: community activity  PERSONAL FACTORS: 1 comorbidity: G6P2  are also affecting patient's functional outcome.   REHAB POTENTIAL: Good  CLINICAL DECISION MAKING: Stable/uncomplicated  EVALUATION COMPLEXITY: Low   GOALS: Goals reviewed with patient? Yes  SHORT TERM GOALS: Target date: 11/30/22  Pt will be independent with HEP.   Baseline: Goal status: INITIAL  2.  Pt will be able to perform full pelvic floor muscle contraction with appropriate breath coordination and full relaxation.  Baseline:  Goal status: INITIAL  3.  Pt will be independent with use of squatty potty, relaxed toileting mechanics, and improved bowel movement techniques in order to increase ease of bowel movements and complete evacuation.   Baseline:  Goal status: INITIAL   LONG TERM GOALS:  Target date: 12/28/22  Pt will be independent with advanced HEP.   Baseline:  Goal status: INITIAL  2.  Pt will demonstrate 3/5 external anal sphincter contraction in order to improve fecal incontinence.    Baseline:  Goal status: INITIAL  3.  Pt will report no more than 1 bowel movement a day without straining and complete evacuation.  Baseline:  Goal status: INITIAL   PLAN:  PT FREQUENCY: 1-2x/week  PT DURATION: 8 weeks  PLANNED INTERVENTIONS: Therapeutic exercises, Therapeutic activity, Neuromuscular re-education, Balance training, Gait training, Patient/Family education, Self Care, Joint mobilization, Dry Needling, Biofeedback, and Manual therapy  PLAN FOR NEXT SESSION: Begin core training; bowel mobilization; mobility exercises' push training.    Julio Alm, PT, DPT10/14/244:57 PM

## 2022-11-09 ENCOUNTER — Ambulatory Visit (INDEPENDENT_AMBULATORY_CARE_PROVIDER_SITE_OTHER): Payer: 59 | Admitting: Radiology

## 2022-11-09 DIAGNOSIS — Z23 Encounter for immunization: Secondary | ICD-10-CM

## 2022-11-09 NOTE — Progress Notes (Signed)
Patient here for HD flu shot, patient tolerated well with no complications.

## 2022-11-10 ENCOUNTER — Ambulatory Visit: Payer: 59 | Admitting: Physical Therapy

## 2022-11-10 ENCOUNTER — Encounter: Payer: Self-pay | Admitting: Physical Therapy

## 2022-11-10 DIAGNOSIS — M5459 Other low back pain: Secondary | ICD-10-CM | POA: Diagnosis not present

## 2022-11-10 DIAGNOSIS — G8929 Other chronic pain: Secondary | ICD-10-CM | POA: Diagnosis not present

## 2022-11-10 DIAGNOSIS — R2681 Unsteadiness on feet: Secondary | ICD-10-CM | POA: Diagnosis not present

## 2022-11-10 DIAGNOSIS — M25562 Pain in left knee: Secondary | ICD-10-CM | POA: Diagnosis not present

## 2022-11-10 DIAGNOSIS — R29898 Other symptoms and signs involving the musculoskeletal system: Secondary | ICD-10-CM

## 2022-11-10 DIAGNOSIS — R279 Unspecified lack of coordination: Secondary | ICD-10-CM

## 2022-11-10 DIAGNOSIS — M6281 Muscle weakness (generalized): Secondary | ICD-10-CM | POA: Diagnosis not present

## 2022-11-10 DIAGNOSIS — M25571 Pain in right ankle and joints of right foot: Secondary | ICD-10-CM | POA: Diagnosis not present

## 2022-11-10 NOTE — Therapy (Signed)
OUTPATIENT PHYSICAL THERAPY LOWER EXTREMITY TREATMENT   Patient Name: Kristina Ayala MRN: [id number] DOB:[birth year], 77 y.o., female Today's Date: 11/10/2022  END OF SESSION:  PT End of Session - 11/10/22 0935     Visit Number 7    PT Start Time 0932    PT Stop Time 1010    PT Time Calculation (min) 38 min    Activity Tolerance Patient tolerated treatment well    Behavior During Therapy WFL for tasks assessed/performed            Past Medical History:  Diagnosis Date   Arthritis    Bursitis    left elbow   Clostridium difficile infection    Diabetes mellitus without complication (HCC)    Frequent falls    HLD (hyperlipidemia)    Hypertension    Infectious colitis    Past Surgical History:  Procedure Laterality Date   CATARACT EXTRACTION Bilateral    COLONOSCOPY     KNEE SURGERY  2014   ORIF PROXIMAL TIBIAL PLATEAU FRACTURE Left 02/19/2012   knee   POLYPECTOMY     TONSILLECTOMY     Patient Active Problem List   Diagnosis Date Noted   Cold intolerance 09/09/2022   Gait abnormality 09/09/2022   Urge incontinence 05/13/2022   Right ovarian cyst 05/13/2022   Tibialis posterior tendinitis, right 04/29/2022   Palpitations 04/12/2022   Fecal smearing 04/12/2022   Foot injury, left, initial encounter 09/16/2021   Tingling 03/27/2021   Chronic fatigue 03/27/2021   Itchy scalp 12/09/2020   Excessive daytime sleepiness 11/03/2020   Coronary artery calcification seen on CAT scan 09/09/2020   Aortic atherosclerosis (HCC) 08/31/2020   Right rib fracture 07/10/2020   Closed low lateral malleolus fracture 07/10/2020   Snoring 05/23/2020   Cough 12/04/2019   Pancreatic mass 11/08/2019   DOE (dyspnea on exertion) 03/20/2019   Chronic left SI joint pain 03/13/2019   Osteoarthritis 01/10/2019   Right lower quadrant abdominal pain 11/09/2018   Adnexal mass, right 11/09/2018   Lung nodules - stable - no f/u needed 06/19/2018   Thyromegaly 10/28/2017   Restless  leg syndrome 10/12/2017   Greater trochanteric bursitis of left hip 07/26/2017   Hypokalemia 04/18/2017   Degenerative arthritis of left knee 12/01/2016   Anxiety 12/01/2016   Diabetes mellitus without complication (HCC) 09/16/2016   Bursitis of right shoulder 09/04/2015   Lateral meniscus derangement 06/03/2015   Tear of LCL (lateral collateral ligament) of knee 06/03/2015   Lumbar radiculopathy 09/19/2014   Low back pain 01/04/2014   Bilateral knee pain 01/04/2014   Diverticulosis of colon without hemorrhage 07/26/2013   Diarrhea 07/26/2013   Trigger thumb of left hand 03/06/2013   Essential hypertension, benign 02/15/2013   Hyperlipidemia 09/28/2012   COPD (chronic obstructive pulmonary disease) (HCC) 05/22/2012   Osteoporosis - on prolia 05/22/2012    PCP: Cheryll Cockayne MD   REFERRING PROVIDER: Judi Saa, DO  REFERRING DIAG: [phone] (ICD-10-CM) - Acute pain of left knee M25.371 (ICD-10-CM) - Right ankle instability  THERAPY DIAG:  Muscle weakness (generalized)  Unspecified lack of coordination  Other low back pain  Chronic pain of left knee  Pain in right ankle and joints of right foot  Other symptoms and signs involving the musculoskeletal system  Unsteadiness on feet  Rationale for Evaluation and Treatment: Rehabilitation  ONSET DATE: L knee chronic, R ankle June/July 2024  SUBJECTIVE:   SUBJECTIVE STATEMENT: Patient reports pain is much better. She sees her Dr on Monday.  PERTINENT HISTORY: OA, hx Cdiff, DM, frequent falls, HLD, HTN, L tibial ORIF 2014 PAIN:  Are you having pain?  "No problem with pain now"/10, at worst can get to 8/10 in my back especially and sometimes the knee   PRECAUTIONS: Fall  RED FLAGS: None   WEIGHT BEARING RESTRICTIONS: No  FALLS:  Has patient fallen in last 6 months? Yes. Number of falls 3- one turned on a windy day/lost balance and fell on her back, another turned after talking with neighbors and tripped on  steps, third fall fell when pivoting/turning as well. No FOF.    LIVING ENVIRONMENT: Lives with: lives alone Lives in: Other townhouse with chair lift but chair lift is broken Stairs:  3-4 STE with rails, flight inside home with broken chair lift  Has following equipment at home:  broken chair lift, 2 canes (keeps one in car)  OCCUPATION: retired   PLOF: Independent, Independent with basic ADLs, Independent with gait, and Independent with transfers  PATIENT GOALS: MD told me to come, need some help getting back into aerobics, improve balance   NEXT MD VISIT: Referring October 21st  OBJECTIVE:   PATIENT SURVEYS:  FOTO 2nd visit   COGNITION: Overall cognitive status: Within functional limits for tasks assessed     SENSATION: Not tested  LOWER EXTREMITY ROM:  Active ROM Left  eval Right  eval  Hip flexion    Hip extension    Hip abduction    Hip adduction    Hip internal rotation    Hip external rotation    Knee flexion 123* 127*  Knee extension -2* -4*  Ankle dorsiflexion 4* 10*  Ankle plantarflexion 60* 50*  Ankle inversion    Ankle eversion     (Blank rows = not tested)  LOWER EXTREMITY MMT:  MMT Right eval Left eval  Hip flexion 4 4-  Hip extension    Hip abduction 3+ 3+  Hip adduction    Hip internal rotation    Hip external rotation    Knee flexion 4+ 4-  Knee extension 4+ 4+  Ankle dorsiflexion 4 4  Ankle plantarflexion 2- 2  Ankle inversion 4+ 4+  Ankle eversion 4+ 4+   (Blank rows = not tested)  FUNCTIONAL TESTS:  Functional gait assessment: 12/30  LLE quite a bit longer than R- about 1/2 to 3/4 inch difference per observation, able to correct with MET but gait still "wobbly"   TODAY'S TREATMENT:                                                                                                                              DATE:  11/10/22 NuStep L5 x 6 minutes Functional status re-assessed for [facility]  11/05/22 NuStep L5x75mins  Leg ext 5#  2x10 HS curls 15# 2x10  STS with ball 2x10 Lateral band steps with green band HHA  Walking on beam On airex cone taps in bars  Leg press 20# 2x10  11/03/22 NuStep L5x32mins Calf raises 2x10 Toe raises 2x10 Step ups 6"- CGA  STS 2x10 Standing marches 2#  2# hip abd/ext 2x10 LAQ 2# 2x10 HS curls red 2x10 Seated hip abd red band 2x10   10/12/23 NuStep L5 x 6 minutes Ankle strengthening- isometric inversion with 5 sec holds, then eversion DF and PF against Red Tband Standing heel raise/toe raise for balance with UUE support and a wall behind. Wall bumps with feet progressively further from wall.  Patient anxious, but able to perform with reps. Mini Squats x 10 Side to side step with R tband resistance at knees, counter support x 10  Eval 09/30/22 Discussed and practiced HEP  PATIENT EDUCATION:  Education details: exam findings, POC, HEP, encouraged use of SPC, discussed heel lift  Person educated: Patient Education method: Explanation, Demonstration, and Handouts Education comprehension: verbalized understanding, returned demonstration, and needs further education  HOME EXERCISE PROGRAM: Access Code: [email or web address] URL: [email or web address] Date: 09/30/2022 Prepared by: Nedra Hai  Exercises - Sit to Stand with Resistance Around Legs  - 1-2 x daily - 7 x weekly - 1 sets - 10 reps - Hip Abduction with Resistance Loop  - 1-2 x daily - 7 x weekly - 1 sets - 10 reps - 1 second  hold - Tandem Stance in Corner  - 1-2 x daily - 7 x weekly - 1 sets - 6 reps - 15-30 seconds  hold  ASSESSMENT:  CLINICAL IMPRESSION: Patient reports she is feeling good today, pain improved. Re-assessed her status for UPOC. She has met most of her goals, but continues to be unsteady at times and therefore has an increased fall risk. She would benefit from continued PT to further challenge her balance skills.   OBJECTIVE IMPAIRMENTS: Abnormal gait, decreased activity tolerance,  decreased balance, decreased mobility, difficulty walking, decreased ROM, decreased strength, impaired flexibility, postural dysfunction, and pain.   ACTIVITY LIMITATIONS: standing, squatting, stairs, transfers, and locomotion level  PARTICIPATION LIMITATIONS: driving, shopping, community activity, and yard work  PERSONAL FACTORS: Age, Behavior pattern, Fitness, Past/current experiences, Social background, and Time since onset of injury/illness/exacerbation are also affecting patient's functional outcome.   REHAB POTENTIAL: Good  CLINICAL DECISION MAKING: Stable/uncomplicated  EVALUATION COMPLEXITY: Low   GOALS: Goals reviewed with patient? Yes  SHORT TERM GOALS: Target date: 10/21/2022   Will be compliant with appropriate progressive HEP  Baseline: Goal status: 11/10/22 met  2.  AROM in B ankles to be equal and symmetrical  Baseline:  Goal status: 10/12/22-met  3.  Will be able to make sharp turns quickly with minimal loss of balance, no more than MinA for safety  Baseline:  Goal status: 11/10/22-met    LONG TERM GOALS: Target date: 11/11/2022    MMT to improve by at least one grade in all weak groups  Baseline:  Goal status: 11/10/22- B hip abd 4+/5, L hip flex 4-, B ankle PF 4-/5, partially met  2.  Will score at least 20 on FGA to show improved balance/reduced fall risk  Baseline:  Goal status: 11/10/22-Met  3.  Will be compliant with appropriate balanced exercise program to include flexibility work, balance, cardio, and strength  Baseline:  Goal status: 11/10/22-met  4. 11/10/22 Score at least 24 on FGA to decrease fall risk  Goal status 20  PLAN:  PT FREQUENCY: 2x/week  PT DURATION: 6 weeks  PLANNED INTERVENTIONS: Therapeutic exercises, Therapeutic activity, Neuromuscular re-education, Gait training, Self Care, Cryotherapy, Moist heat, Manual therapy, and Re-evaluation  PLAN FOR NEXT SESSION:  ankle ROM, general strength and balance training; encourage  shoe lift   Oley Balm DPT 11/10/22 10:07 AM  11/10/22 10:07 AM

## 2022-11-11 ENCOUNTER — Ambulatory Visit (INDEPENDENT_AMBULATORY_CARE_PROVIDER_SITE_OTHER)
Admission: RE | Admit: 2022-11-11 | Discharge: 2022-11-11 | Disposition: A | Discharge status: Home / Self Care | Payer: 59 | Source: Ambulatory Visit | Attending: Internal Medicine | Admitting: Internal Medicine

## 2022-11-11 DIAGNOSIS — M810 Age-related osteoporosis without current pathological fracture: Secondary | ICD-10-CM

## 2022-11-12 ENCOUNTER — Ambulatory Visit: Payer: 59 | Admitting: Physical Therapy

## 2022-11-12 NOTE — Progress Notes (Unsigned)
[name] [address] [place] [address] Phone: [phone] Subjective:   Bruce Donath, am serving as a scribe for Dr. Antoine Primas.  I'm seeing this patient by the request  of:  Pincus Sanes, MD  CC:: Complaint follow-up  [id number]  09/14/2022 Continues to have some instability of the ankle noted.  Patient has had no difficulty.  We discussed with patient about strengthening.  Feels like she would do better in physical therapy where she would have time for herself.  Has been not focusing on herself secondary to taking care of her grandchildren.  Will see if protected time will be more beneficial.  Discussed with patient about icing regimen and home exercises.  Discussed proper shoes and over-the-counter orthotics.  Follow-up again in 6 to 8 weeks.     Continues to have some instability of the knee.  Given injection by another provider.  We discussed with patient to monitor at this time.  Will start some physical therapy which I think will be beneficial.  Could use bracing if necessary.  Follow-up again in 6 to 8 weeks and we will consider repeat injections if needed.      Update 11/15/2022 Sharnee A Spratling is a 77 y.o. female coming in with complaint of L knee, R ankle and LBP. Epidural 10/25/2022. Patient states that she completed PT for her knee last week. Only has pain after sitting in place for a long time.   Continuing pelvic floor PT. Epidural was helpful.   Has not had pain in her ankle since last visit.     Past Medical History:  Diagnosis Date   Arthritis    Bursitis    left elbow   Clostridium difficile infection    Diabetes mellitus without complication (HCC)    Frequent falls    HLD (hyperlipidemia)    Hypertension    Infectious colitis    Past Surgical History:  Procedure Laterality Date   CATARACT EXTRACTION Bilateral    COLONOSCOPY     KNEE SURGERY  2014   ORIF PROXIMAL TIBIAL PLATEAU FRACTURE Left  02/19/2012   knee   POLYPECTOMY     TONSILLECTOMY     Social History   Socioeconomic History   Marital status: Divorced    Spouse name: Not on file   Number of children: 2   Years of education: Not on file   Highest education level: Some college, no degree  Occupational History   Occupation: RETIRED  Tobacco Use   Smoking status: Former    Current packs/day: 0.00    Average packs/day: 0.3 packs/day for 50.0 years (12.5 ttl pk-yrs)    Types: Cigarettes    Start date: 08/27/1964    Quit date: 08/28/2014    Years since quitting: 8.2   Smokeless tobacco: Never   Tobacco comments:    1 pack every 3 days  Vaping Use   Vaping status: Never Used  Substance and Sexual Activity   Alcohol use: No    Alcohol/week: 0.0 standard drinks of alcohol   Drug use: No   Sexual activity: Not Currently  Other Topics Concern   Not on file  Social History Narrative   Lives alone    Right handed   Caffeine: 3x weekly         Social Determinants of Health   Financial Resource Strain: Low Risk  (08/26/2022)   Overall Financial Resource Strain (CARDIA)    Difficulty of Paying Living Expenses: Not  hard at all  Food Insecurity: No Food Insecurity (08/26/2022)   Hunger Vital Sign    Worried About Running Out of Food in the Last Year: Never true    Ran Out of Food in the Last Year: Never true  Transportation Needs: No Transportation Needs (08/26/2022)   PRAPARE - Administrator, Civil Service (Medical): No    Lack of Transportation (Non-Medical): No  Physical Activity: Inactive (08/26/2022)   Exercise Vital Sign    Days of Exercise per Week: 0 days    Minutes of Exercise per Session: 0 min  Stress: No Stress Concern Present (08/26/2022)   [employer] of Occupational Health - Occupational Stress Questionnaire    Feeling of Stress : Only a little  Social Connections: Moderately Integrated (08/26/2022)   Social Connection and Isolation Panel [NHANES]    Frequency of Communication with  Friends and Family: More than three times a week    Frequency of Social Gatherings with Friends and Family: Three times a week    Attends Religious Services: More than 4 times per year    Active Member of Clubs or Organizations: Yes    Attends Banker Meetings: More than 4 times per year    Marital Status: Divorced   Allergies  Allergen Reactions   Doxycycline Diarrhea   Minocycline Diarrhea   Wellbutrin [Bupropion] Swelling    Per pt her tongue was swollen and sore/symptoms stopped once the medication was discontinued.    Penicillins Rash    Has patient had a PCN reaction causing immediate rash, facial/tongue/throat swelling, SOB or lightheadedness with hypotension: no Has patient had a PCN reaction causing severe rash involving mucus membranes or skin necrosis: no Has patient had a PCN reaction that required hospitalization: unknown Has patient had a PCN reaction occurring within the last 10 years: no If all of the above answers are "NO", then may proceed with Cephalosporin use.    Z-Pak [Azithromycin] Other (See Comments)    diarrhea   Family History  Problem Relation Age of Onset   Diverticulosis Mother    Ovarian cancer Mother    Pancreatic cancer Father    Lung cancer Father    Colon polyps Sister    Heart failure Daughter    Diabetes Son    Colon cancer Neg Hx    Breast cancer Neg Hx    Esophageal cancer Neg Hx    Stomach cancer Neg Hx     Current Outpatient Medications (Endocrine & Metabolic):    denosumab (PROLIA) 60 MG/ML SOSY injection, INJECT 60MG  INTO THE SKIN AS DIRECTED  Current Outpatient Medications (Cardiovascular):    amLODipine (NORVASC) 10 MG tablet,    atorvastatin (LIPITOR) 40 MG tablet, TAKE 1 TABLET(40 MG) BY MOUTH DAILY. FOLLOW-UP APPOINTMENT WITH LABS ARE DUE   hydrochlorothiazide (HYDRODIURIL) 25 MG tablet, TAKE 1 TABLET(25 MG) BY MOUTH EVERY MORNING   lisinopril (ZESTRIL) 40 MG tablet, TAKE 1 TABLET(40 MG) BY MOUTH  DAILY  Current Outpatient Medications (Respiratory):    fluticasone (FLONASE) 50 MCG/ACT nasal spray, Place 2 sprays into both nostrils daily.   Current Outpatient Medications (Hematological):    Cyanocobalamin (VITAMIN B-12) 2500 MCG SUBL, Place 2,500 mcg under the tongue daily.  Current Outpatient Medications (Other):    ALPRAZolam (XANAX) 0.5 MG tablet, TAKE 1 TABLET(0.5 MG) BY MOUTH EVERY DAY   cholecalciferol (VITAMIN D) 25 MCG (1000 UNIT) tablet, Take 2,000 Units by mouth daily.   gabapentin (NEURONTIN) 100 MG capsule, Take 2 capsules (  200 mg total) by mouth at bedtime as needed.   MAGNESIUM PO, Take by mouth.   potassium chloride (KLOR-CON) 10 MEQ tablet, Take 10 mEq by mouth daily.   TURMERIC PO, Take 1 tablet by mouth daily.   Reviewed prior external information including notes and imaging from  primary care provider As well as notes that were available from care everywhere and other healthcare systems.  Past medical history, social, surgical and family history all reviewed in electronic medical record.  No pertanent information unless stated regarding to the chief complaint.   Review of Systems:  No headache, visual changes, nausea, vomiting, diarrhea, constipation, dizziness, abdominal pain, skin rash, fevers, chills, night sweats, weight loss, swollen lymph nodes, body aches, joint swelling, chest pain, shortness of breath, mood changes. POSITIVE muscle aches  Objective  Blood pressure 132/82, pulse 100, height 5\' 3"  (1.6 m), weight 165 lb (74.8 kg), SpO2 99%.   General: No apparent distress alert and oriented x3 mood and affect normal, dressed appropriately.  HEENT: Pupils equal, extraocular movements intact  Respiratory: Patient's speak in full sentences and does not appear short of breath  Cardiovascular: No lower extremity edema, non tender, no erythema  Back exam does have some loss lordosis noted.  Some tenderness to palpation in the paraspinal musculature  noted. Tightness noted with Pearlean Brownie right greater than left. Ankle does have an area where it does appear that there was some type of small bites right around the sock lining.  Neurovascularly intact distally.   Impression and Recommendations:      The above documentation has been reviewed and is accurate and complete Judi Saa, DO

## 2022-11-15 ENCOUNTER — Ambulatory Visit (INDEPENDENT_AMBULATORY_CARE_PROVIDER_SITE_OTHER): Payer: 59 | Admitting: Family Medicine

## 2022-11-15 VITALS — BP 132/82 | HR 100 | Ht 63.0 in | Wt 165.0 lb

## 2022-11-15 DIAGNOSIS — M5416 Radiculopathy, lumbar region: Secondary | ICD-10-CM | POA: Diagnosis not present

## 2022-11-15 NOTE — Assessment & Plan Note (Signed)
Seems to be doing much better after the injections at this time.  I do not see any other need for another epidural at this time.  Discussed with patient about increase activity slowly otherwise.  Discussed icing regimen at home exercises, discussed avoiding certain activities.  Follow-up with me again in 6 to 8 weeks otherwise.

## 2022-11-15 NOTE — Patient Instructions (Addendum)
[place] Total Support Original So glad you're doing well See you again in 2 months just in case

## 2022-11-16 ENCOUNTER — Ambulatory Visit: Payer: 59 | Admitting: Physical Therapy

## 2022-11-17 ENCOUNTER — Ambulatory Visit: Payer: 59

## 2022-11-17 DIAGNOSIS — M5459 Other low back pain: Secondary | ICD-10-CM | POA: Diagnosis not present

## 2022-11-17 DIAGNOSIS — R151 Fecal smearing: Secondary | ICD-10-CM | POA: Diagnosis not present

## 2022-11-17 DIAGNOSIS — R279 Unspecified lack of coordination: Secondary | ICD-10-CM | POA: Diagnosis not present

## 2022-11-17 DIAGNOSIS — M6281 Muscle weakness (generalized): Secondary | ICD-10-CM | POA: Diagnosis not present

## 2022-11-17 DIAGNOSIS — R293 Abnormal posture: Secondary | ICD-10-CM

## 2022-11-17 DIAGNOSIS — M6289 Other specified disorders of muscle: Secondary | ICD-10-CM | POA: Diagnosis not present

## 2022-11-17 NOTE — Therapy (Addendum)
OUTPATIENT PHYSICAL THERAPY FEMALE PELVIC EVALUATION   Patient Name: Kristina Ayala MRN: [id number] DOB:July 31, 1945, 77 y.o., female Today's Date: 11/17/2022  END OF SESSION:  PT End of Session - 11/17/22 1102     Visit Number 8    Date for PT Re-Evaluation 12/08/22    Authorization Type [employer] MCR    PT Start Time 1102    PT Stop Time 1140    PT Time Calculation (min) 38 min    Activity Tolerance Patient tolerated treatment well    Behavior During Therapy WFL for tasks assessed/performed               Past Medical History:  Diagnosis Date   Arthritis    Bursitis    left elbow   Clostridium difficile infection    Diabetes mellitus without complication (HCC)    Frequent falls    HLD (hyperlipidemia)    Hypertension    Infectious colitis    Past Surgical History:  Procedure Laterality Date   CATARACT EXTRACTION Bilateral    COLONOSCOPY     KNEE SURGERY  2014   ORIF PROXIMAL TIBIAL PLATEAU FRACTURE Left 02/19/2012   knee   POLYPECTOMY     TONSILLECTOMY     Patient Active Problem List   Diagnosis Date Noted   Cold intolerance 09/09/2022   Gait abnormality 09/09/2022   Urge incontinence 05/13/2022   Right ovarian cyst 05/13/2022   Tibialis posterior tendinitis, right 04/29/2022   Palpitations 04/12/2022   Fecal smearing 04/12/2022   Foot injury, left, initial encounter 09/16/2021   Tingling 03/27/2021   Chronic fatigue 03/27/2021   Itchy scalp 12/09/2020   Excessive daytime sleepiness 11/03/2020   Coronary artery calcification seen on CAT scan 09/09/2020   Aortic atherosclerosis (HCC) 08/31/2020   Right rib fracture 07/10/2020   Closed low lateral malleolus fracture 07/10/2020   Snoring 05/23/2020   Cough 12/04/2019   Pancreatic mass 11/08/2019   DOE (dyspnea on exertion) 03/20/2019   Chronic left SI joint pain 03/13/2019   Osteoarthritis 01/10/2019   Right lower quadrant abdominal pain 11/09/2018   Adnexal mass, right 11/09/2018   Lung nodules  - stable - no f/u needed 06/19/2018   Thyromegaly 10/28/2017   Restless leg syndrome 10/12/2017   Greater trochanteric bursitis of left hip 07/26/2017   Hypokalemia 04/18/2017   Degenerative arthritis of left knee 12/01/2016   Anxiety 12/01/2016   Diabetes mellitus without complication (HCC) 09/16/2016   Bursitis of right shoulder 09/04/2015   Lateral meniscus derangement 06/03/2015   Tear of LCL (lateral collateral ligament) of knee 06/03/2015   Lumbar radiculopathy 09/19/2014   Low back pain 01/04/2014   Bilateral knee pain 01/04/2014   Diverticulosis of colon without hemorrhage 07/26/2013   Diarrhea 07/26/2013   Trigger thumb of left hand 03/06/2013   Essential hypertension, benign 02/15/2013   Hyperlipidemia 09/28/2012   COPD (chronic obstructive pulmonary disease) (HCC) 05/22/2012   Osteoporosis - on prolia 05/22/2012    PCP: Pincus Sanes, MD  REFERRING PROVIDER: Selmer Dominion, NP  REFERRING DIAG: R15.1 (ICD-10-CM) - Fecal smearing M62.89 (ICD-10-CM) - Pelvic floor dysfunction in female  THERAPY DIAG:  Muscle weakness (generalized)  Unspecified lack of coordination  Other low back pain  Abnormal posture  Rationale for Evaluation and Treatment: Rehabilitation  ONSET DATE: June 2024  SUBJECTIVE:  SUBJECTIVE STATEMENT: Pt states that she feels like this issue is really associated with when her daughter calls.   PAIN:  Are you having pain? Yes NPRS scale: 0/10 current; after 30 minutes of standing 6/10 Pain location:  low back pain  Pain type: pounding pain across low back/aches Pain description: intermittent   Aggravating factors: prolonged standing  Relieving factors: low back injection  PRECAUTIONS: None  RED FLAGS: None   WEIGHT BEARING RESTRICTIONS:  No  FALLS:  Has patient fallen in last 6 months? Yes. Number of falls 3  LIVING ENVIRONMENT: Lives with: lives alone Lives in: House/apartment   OCCUPATION: front desk assistant  PLOF: Independent  PATIENT GOALS: strengthen pelvic floor; stop fecal incontinence   PERTINENT HISTORY:  G6P2; Rt ovarian cyst  BOWEL MOVEMENT: Pain with bowel movement: No Type of bowel movement:Type (Bristol Stool Scale) 5-6, Frequency 2x/day, and Strain Yes Fully empty rectum: No Leakage: Yes: when she is stressed or anxious - happens more often when she is in bed or sleeping  Pads: No Fiber supplement: Yes: she had been, but stopped taking it  URINATION: Pain with urination: No Fully empty bladder: Yes: - Stream: Strong Urgency: No Frequency: 2x/day, 1x/night Leakage:  none Pads: No  INTERCOURSE: Not sexually active   PREGNANCY: Vaginal deliveries 2 Tearing No C-section deliveries 0 Currently pregnant No  PROLAPSE: None   OBJECTIVE:  Note: Objective measures were completed at Evaluation unless otherwise noted. 11/02/22:  DIAGNOSTIC FINDINGS:  CT/US: ovarian cyst  COGNITION: Overall cognitive status: Within functional limits for tasks assessed     SENSATION: Light touch: Appears intact Proprioception: Appears intact  FUNCTIONAL TESTS:  Single leg stance:   Rt: unable, pelvic drop Lt: unable, pelvic drop  GAIT: Comments: flexed forward posture  POSTURE: rounded shoulders, forward head, decreased lumbar lordosis, increased thoracic kyphosis, posterior pelvic tilt, and Rt thoracic/Lt lumbar curvature ; Rt hip elevation   LUMBARAROM/PROM:  A/PROM A/PROM  Eval (% available)  Flexion 75  Extension 50  Right lateral flexion WNL  Left lateral flexion WNL  Right rotation 50  Left rotation 75   (Blank rows = not tested)    PALPATION:   General  some restriction throughout abdomen, no tenderness                External Perineal Exam WNL                              Internal Pelvic Floor low tone; paradoxical contraction with bearing down/bulge  Patient confirms identification and approves PT to assess internal pelvic floor and treatment Yes  PELVIC MMT:   MMT eval  Vaginal NA  Internal Anal Sphincter 1/5  External Anal Sphincter 0-1/5  Puborectalis 1/5  Diastasis Recti 2 finger width separation with distortion upon increased abdominal pressure  (Blank rows = not tested)        TONE: low  PROLAPSE: NA  TODAY'S TREATMENT:  DATE:  11/17/22 Neuromuscular re-education: Seated hip adduction with pelvic floor muscle and transversus abdominus contraction 2 x 10 Seated hip abduction red band with pelvic floor muscle and transversus abdominus contraction 2 x 10 Seated march red band with pelvic floor muscle and transversus abdominus contraction 2 x 10 Seated rotation with resistance 2 x 10 bil green band Seated horizontal abduction/extension green band 2 x 10   11/08/22 Neuromuscular re-education: Pelvic floor strengthening program review Supine hip adduction with pelvic floor muscle and transversus abdominus contraction 2 x 10 Supine bridge with pelvic floor muscle and transversus abdominus contraction 2 x 10 Seated hip adduction with pelvic floor muscle and transversus abdominus contraction 2 x 10 Seated hip abduction red band with pelvic floor muscle and transversus abdominus contraction 2 x 10 Seated march red band with pelvic floor muscle and transversus abdominus contraction 2 x 10 Exercises: Lower trunk rotation 3 x 10  11/02/22  EVAL  Neuromuscular re-education: Pt provides verbal consent for internal vaginal/rectal pelvic floor exam. Pelvic floor contraction training with internal rectal feedback Quick flicks Long holds  Therapeutic activities: Squatty potty Relaxed toilet mechanics  Self-bowel  massage    PATIENT EDUCATION:  Education details: See above Person educated: Patient Education method: Programmer, multimedia, Demonstration, Actor cues, Verbal cues, and Handouts Education comprehension: verbalized understanding  HOME EXERCISE PROGRAM: [id number]  ASSESSMENT:  CLINICAL IMPRESSION: Discussed with patient that anxiety may be impacting her bowel movements, making them more liquid, but she should hopefully develop the external anal sphincter control to hold in fecal matter when these episodes occur. Since she does not have sensation of smearing happening and she has notable external anal sphincter weakness, there is an excellent chance that she will see progress. We reviewed previous progressions since she has not been able to perform while at home in the last week. She did well with all activities and starting to progress towards standing activities. HEP not updated since she was not able to perform at all since last session. She will continue to benefit from skilled PT intervention in order to decrease fecal incontinence and perform pelvic floor/functional strengthening program.   OBJECTIVE IMPAIRMENTS: decreased activity tolerance, decreased coordination, decreased endurance, decreased strength, increased fascial restrictions, increased muscle spasms, impaired tone, postural dysfunction, and pain.   ACTIVITY LIMITATIONS: continence  PARTICIPATION LIMITATIONS: community activity  PERSONAL FACTORS: 1 comorbidity: G6P2  are also affecting patient's functional outcome.   REHAB POTENTIAL: Good  CLINICAL DECISION MAKING: Stable/uncomplicated  EVALUATION COMPLEXITY: Low   GOALS: Goals reviewed with patient? Yes  SHORT TERM GOALS: Target date: 11/30/22  Pt will be independent with HEP.   Baseline: Goal status: INITIAL  2.  Pt will be able to perform full pelvic floor muscle contraction with appropriate breath coordination and full relaxation.  Baseline:  Goal status:  INITIAL  3.  Pt will be independent with use of squatty potty, relaxed toileting mechanics, and improved bowel movement techniques in order to increase ease of bowel movements and complete evacuation.   Baseline:  Goal status: INITIAL   LONG TERM GOALS: Target date: 12/28/22  Pt will be independent with advanced HEP.   Baseline:  Goal status: INITIAL  2.  Pt will demonstrate 3/5 external anal sphincter contraction in order to improve fecal incontinence.    Baseline:  Goal status: INITIAL  3.  Pt will report no more than 1 bowel movement a day without straining and complete evacuation.  Baseline:  Goal status: INITIAL   PLAN:  PT FREQUENCY: 1-2x/week  PT DURATION: 8 weeks  PLANNED INTERVENTIONS: Therapeutic exercises, Therapeutic activity, Neuromuscular re-education, Balance training, Gait training, Patient/Family education, Self Care, Joint mobilization, Dry Needling, Biofeedback, and Manual therapy  PLAN FOR NEXT SESSION: Progress core training; bowel mobilization; mobility exercises' push training.    Julio Alm, PT, DPT10/23/2411:41 AM  PHYSICAL THERAPY DISCHARGE SUMMARY  Visits from Start of Care: 3  Current functional level related to goals / functional outcomes: Not met   Remaining deficits: See above   Education / Equipment: HEP   Patient agrees to discharge. Patient goals were not met. Patient is being discharged due to not returning since the last visit.  Julio Alm, PT, DPT02/13/258:19 AM

## 2022-11-22 ENCOUNTER — Telehealth: Payer: Self-pay | Admitting: Gastroenterology

## 2022-11-22 NOTE — Telephone Encounter (Signed)
Inbound call from patient requesting to speak with a nurse in regards to symptoms she is having. Please advise.

## 2022-11-23 NOTE — Telephone Encounter (Signed)
Patient states that over the weekend, she had one episode of "black stool" with difficulty evacuating. She states that she then started having leakage of stool prior to her attempt to attend church. She decided to take an Imodium so she could get to church. States she has not had any bowel movements since taking the imodium tablet. Patient denies any further episodes of "black stool"; denies any abdominal pain. She does take fiber gummies on a normal basis and says she is following with pelvic floor therapy (has been 4 times).   We discussed fecal incontinence as it pertains to weakened pelvic floor muscles. Patient is advised to hold imodium, continue fiber supplement (may be beneficial to use benefiber) and eat high fiber foods. Advised to increase water intake to at least 64 oz daily, increase exercise as tolerated, continue pelvic floor therapy. Patient verbalizes understanding and states she will follow this until her December follow up with [facility], PA-C. Patient to call for any worsening of symptoms in the interm.

## 2022-11-26 NOTE — Progress Notes (Unsigned)
   Rubin Payor, PhD, LAT, ATC acting as a scribe for [name], MD.  Kristina Ayala is a 77 y.o. female who presents to [facility] at [facility] today for exacerbation of her L knee pain. Pt's last visit for her knee was on 09/14/22 w/ Dr. Katrinka Blazing.  Today, pt reports ***  Dx imaging: 07/06/22 L knee XR  Pertinent review of systems: ***  Relevant historical information: ***   Exam:  There were no vitals taken for this visit. General: Well Developed, well nourished, and in no acute distress.   MSK: ***    Lab and Radiology Results No results found for this or any previous visit (from the past 72 hour(s)). No results found.     Assessment and Plan: 77 y.o. female with ***   PDMP not reviewed this encounter. No orders of the defined types were placed in this encounter.  No orders of the defined types were placed in this encounter.    Discussed warning signs or symptoms. Please see discharge instructions. Patient expresses understanding.   ***

## 2022-11-29 ENCOUNTER — Other Ambulatory Visit: Payer: Self-pay

## 2022-11-29 ENCOUNTER — Encounter: Payer: Self-pay | Admitting: Family Medicine

## 2022-11-29 ENCOUNTER — Ambulatory Visit (INDEPENDENT_AMBULATORY_CARE_PROVIDER_SITE_OTHER): Payer: 59

## 2022-11-29 ENCOUNTER — Ambulatory Visit (INDEPENDENT_AMBULATORY_CARE_PROVIDER_SITE_OTHER): Payer: 59 | Admitting: Family Medicine

## 2022-11-29 VITALS — BP 138/82 | HR 92 | Ht 63.0 in | Wt 163.0 lb

## 2022-11-29 DIAGNOSIS — G8929 Other chronic pain: Secondary | ICD-10-CM

## 2022-11-29 DIAGNOSIS — M438X6 Other specified deforming dorsopathies, lumbar region: Secondary | ICD-10-CM | POA: Diagnosis not present

## 2022-11-29 DIAGNOSIS — M5416 Radiculopathy, lumbar region: Secondary | ICD-10-CM

## 2022-11-29 DIAGNOSIS — M25562 Pain in left knee: Secondary | ICD-10-CM | POA: Diagnosis not present

## 2022-11-29 DIAGNOSIS — M5186 Other intervertebral disc disorders, lumbar region: Secondary | ICD-10-CM | POA: Diagnosis not present

## 2022-11-29 DIAGNOSIS — Z01818 Encounter for other preprocedural examination: Secondary | ICD-10-CM | POA: Diagnosis not present

## 2022-11-29 DIAGNOSIS — M47816 Spondylosis without myelopathy or radiculopathy, lumbar region: Secondary | ICD-10-CM | POA: Diagnosis not present

## 2022-11-29 DIAGNOSIS — M810 Age-related osteoporosis without current pathological fracture: Secondary | ICD-10-CM

## 2022-11-29 MED ORDER — PROLIA 60 MG/ML ~~LOC~~ SOSY: PREFILLED_SYRINGE | SUBCUTANEOUS | 0 refills | Status: DC

## 2022-11-29 NOTE — Patient Instructions (Signed)
Thank you for coming in today.   Please get an Xray today before you leave   You should hear from MRI scheduling within 1 week. If you do not hear please let me know.    Anticipate injection in your back after the MRI comes back.

## 2022-11-30 NOTE — Progress Notes (Unsigned)
Virtual Visit via Video Note  I connected with Kristina Ayala on 11/30/22 at 10:20 AM EST by a video enabled telemedicine application and verified that I am speaking with the correct person using two identifiers.   I discussed the limitations of evaluation and management by telemedicine and the availability of in person appointments. The patient expressed understanding and agreed to proceed.  Present for the visit:  Myself, Dr Cheryll Cockayne, Volney Presser.  The patient is currently at home and I am in the office.    No referring provider.    History of Present Illness: She is here for an acute visit.    Needs referral to neurology.    Abnormal gait - she feels unsteady.  She sometimes uses a cane and feels she needs to use it.  Her family think she needs to be using walker.  Her ankles give out at times, but she just feels unsteady.    She has seen sports med -her balance issue is not related to her knees.  She does have chronic back issues and has some back pain.  She is scheduled to have an MRI of her back.  No N/T in feet.  Legs feel weak L > R.      No lightheadedness / dizziness or other symptoms causing the imbalance feeling.   Social History   Socioeconomic History   Marital status: Divorced    Spouse name: Not on file   Number of children: 2   Years of education: Not on file   Highest education level: Some college, no degree  Occupational History   Occupation: RETIRED  Tobacco Use   Smoking status: Former    Current packs/day: 0.00    Average packs/day: 0.3 packs/day for 50.0 years (12.5 ttl pk-yrs)    Types: Cigarettes    Start date: 08/27/1964    Quit date: 08/28/2014    Years since quitting: 8.2   Smokeless tobacco: Never   Tobacco comments:    1 pack every 3 days  Vaping Use   Vaping status: Never Used  Substance and Sexual Activity   Alcohol use: No    Alcohol/week: 0.0 standard drinks of alcohol   Drug use: No   Sexual activity: Not Currently   Other Topics Concern   Not on file  Social History Narrative   Lives alone    Right handed   Caffeine: 3x weekly         Social Determinants of Health   Financial Resource Strain: Low Risk  (08/26/2022)   Overall Financial Resource Strain (CARDIA)    Difficulty of Paying Living Expenses: Not hard at all  Food Insecurity: No Food Insecurity (08/26/2022)   Hunger Vital Sign    Worried About Running Out of Food in the Last Year: Never true    Ran Out of Food in the Last Year: Never true  Transportation Needs: No Transportation Needs (08/26/2022)   PRAPARE - Administrator, Civil Service (Medical): No    Lack of Transportation (Non-Medical): No  Physical Activity: Inactive (08/26/2022)   Exercise Vital Sign    Days of Exercise per Week: 0 days    Minutes of Exercise per Session: 0 min  Stress: No Stress Concern Present (08/26/2022)   [employer] of Occupational Health - Occupational Stress Questionnaire    Feeling of Stress : Only a little  Social Connections: Moderately Integrated (08/26/2022)   Social Connection and Isolation Panel [NHANES]    Frequency of Communication  with Friends and Family: More than three times a week    Frequency of Social Gatherings with Friends and Family: Three times a week    Attends Religious Services: More than 4 times per year    Active Member of Clubs or Organizations: Yes    Attends Banker Meetings: More than 4 times per year    Marital Status: Divorced     Observations/Objective: Appears well in NAD   Assessment and Plan:  See Problem List for Assessment and Plan of chronic medical problems.   Follow Up Instructions:    I discussed the assessment and treatment plan with the patient. The patient was provided an opportunity to ask questions and all were answered. The patient agreed with the plan and demonstrated an understanding of the instructions.   The patient was advised to call back or seek an in-person  evaluation if the symptoms worsen or if the condition fails to improve as anticipated.    Pincus Sanes, MD

## 2022-12-01 ENCOUNTER — Telehealth: Payer: Self-pay

## 2022-12-01 ENCOUNTER — Telehealth (INDEPENDENT_AMBULATORY_CARE_PROVIDER_SITE_OTHER): Payer: 59 | Admitting: Internal Medicine

## 2022-12-01 ENCOUNTER — Encounter: Payer: Self-pay | Admitting: Internal Medicine

## 2022-12-01 DIAGNOSIS — R269 Unspecified abnormalities of gait and mobility: Secondary | ICD-10-CM

## 2022-12-01 DIAGNOSIS — M810 Age-related osteoporosis without current pathological fracture: Secondary | ICD-10-CM

## 2022-12-01 MED ORDER — DENOSUMAB 60 MG/ML ~~LOC~~ SOSY
60.0000 mg | PREFILLED_SYRINGE | Freq: Once | SUBCUTANEOUS | Status: AC
Start: 2022-12-01 — End: 2022-12-02
  Administered 2022-12-02: 60 mg via SUBCUTANEOUS

## 2022-12-01 NOTE — Telephone Encounter (Signed)
Patient scheduled for tomorrow 11/7 Prolia injection with $372, patient supplied, from 03/02/2022. Please advise if still okay to complete tomorrow?

## 2022-12-01 NOTE — Assessment & Plan Note (Signed)
Chronic No numbness, tingling, lightheadedness or dizziness contributing Knee arthritis is not related She does have chronic back issues and a previous MRI showed several things that could be influencing her unsteady gait Scheduled for new MRI via sports medicine Referral to neurology to help further evaluate unsteady gait

## 2022-12-02 ENCOUNTER — Ambulatory Visit: Payer: 59

## 2022-12-02 ENCOUNTER — Encounter: Payer: Self-pay | Admitting: Internal Medicine

## 2022-12-02 DIAGNOSIS — M810 Age-related osteoporosis without current pathological fracture: Secondary | ICD-10-CM

## 2022-12-02 MED ORDER — DENOSUMAB 60 MG/ML ~~LOC~~ SOSY
60.0000 mg | PREFILLED_SYRINGE | Freq: Once | SUBCUTANEOUS | Status: DC
Start: 2023-06-01 — End: 2023-04-07

## 2022-12-02 NOTE — Progress Notes (Signed)
Patient visits today to receive her PROLIA vaccine. Patient was informed and tolerated well. Patient was notified to reach out to [place] if needed.   Order placed for May 2025 to start PreCert

## 2022-12-02 NOTE — Telephone Encounter (Signed)
Patient is cleared for PROLIA injection on 12/02/2022 per PA information on UPDATE BELOW.  Mentioned in provider note last on 08/2022. Medication is PATIENT SUPPLIED supplied. ZOXWR:$[id number]

## 2022-12-07 ENCOUNTER — Other Ambulatory Visit (HOSPITAL_COMMUNITY): Payer: Self-pay

## 2022-12-15 ENCOUNTER — Telehealth: Payer: Self-pay | Admitting: Internal Medicine

## 2022-12-15 NOTE — Telephone Encounter (Signed)
Prescription Request  12/15/2022  LOV: 09/09/2022  What is the name of the medication or equipment? Potassium chloride tablets  Have you contacted your pharmacy to request a refill? Yes   Which pharmacy would you like this sent to?  [id number] [place], [place] - [phone] AT [facility] & [address] [place] [address] Phone: [phone] Fax: [phone]    Patient notified that their request is being sent to the clinical staff for review and that they should receive a response within 2 business days.   Please advise at Mobile [phone] (mobile)

## 2022-12-16 ENCOUNTER — Ambulatory Visit
Admission: RE | Admit: 2022-12-16 | Discharge: 2022-12-16 | Disposition: A | Discharge status: Home / Self Care | Payer: 59 | Source: Ambulatory Visit | Attending: Family Medicine | Admitting: Family Medicine

## 2022-12-16 ENCOUNTER — Other Ambulatory Visit: Payer: Self-pay

## 2022-12-16 DIAGNOSIS — G8929 Other chronic pain: Secondary | ICD-10-CM

## 2022-12-16 DIAGNOSIS — M5416 Radiculopathy, lumbar region: Secondary | ICD-10-CM

## 2022-12-16 DIAGNOSIS — M48061 Spinal stenosis, lumbar region without neurogenic claudication: Secondary | ICD-10-CM | POA: Diagnosis not present

## 2022-12-16 MED ORDER — POTASSIUM CHLORIDE ER 10 MEQ PO TBCR: 10.0000 meq | EXTENDED_RELEASE_TABLET | Freq: Every day | ORAL | 3 refills | Status: DC

## 2022-12-16 NOTE — Telephone Encounter (Signed)
Sent in today 

## 2022-12-20 NOTE — Progress Notes (Signed)
Low back x-ray shows arthritis and scoliosis.

## 2022-12-28 ENCOUNTER — Ambulatory Visit: Payer: 59 | Admitting: Family Medicine

## 2022-12-28 NOTE — Progress Notes (Unsigned)
[facility] [address] [place] [address] Phone: [phone] Subjective:    I'm seeing this patient by the request  of:  Pincus Sanes, MD  CC:   [id number]  11/15/2022 Seems to be doing much better after the injections at this time.  I do not see any other need for another epidural at this time.  Discussed with patient about increase activity slowly otherwise.  Discussed icing regimen at home exercises, discussed avoiding certain activities.  Follow-up with me again in 6 to 8 weeks otherwise.     Update 12/28/2022 Kristina Ayala is a 77 y.o. female coming in with complaint of lumbar radiculopathy. Saw Dr. Denyse Amass on 11/4 for knee pain. Patient states   MRI 01/15/2023 Pending read  Past Medical History:  Diagnosis Date   Arthritis    Bursitis    left elbow   Clostridium difficile infection    Diabetes mellitus without complication (HCC)    Frequent falls    HLD (hyperlipidemia)    Hypertension    Infectious colitis    Past Surgical History:  Procedure Laterality Date   CATARACT EXTRACTION Bilateral    COLONOSCOPY     KNEE SURGERY  2014   ORIF PROXIMAL TIBIAL PLATEAU FRACTURE Left 02/19/2012   knee   POLYPECTOMY     TONSILLECTOMY     Social History   Socioeconomic History   Marital status: Divorced    Spouse name: Not on file   Number of children: 2   Years of education: Not on file   Highest education level: Some college, no degree  Occupational History   Occupation: RETIRED  Tobacco Use   Smoking status: Former    Current packs/day: 0.00    Average packs/day: 0.3 packs/day for 50.0 years (12.5 ttl pk-yrs)    Types: Cigarettes    Start date: 08/27/1964    Quit date: 08/28/2014    Years since quitting: 8.3   Smokeless tobacco: Never   Tobacco comments:    1 pack every 3 days  Vaping Use   Vaping status: Never Used  Substance and Sexual Activity   Alcohol use: No    Alcohol/week: 0.0 standard drinks of  alcohol   Drug use: No   Sexual activity: Not Currently  Other Topics Concern   Not on file  Social History Narrative   Lives alone    Right handed   Caffeine: 3x weekly         Social Determinants of Health   Financial Resource Strain: Low Risk  (08/26/2022)   Overall Financial Resource Strain (CARDIA)    Difficulty of Paying Living Expenses: Not hard at all  Food Insecurity: No Food Insecurity (08/26/2022)   Hunger Vital Sign    Worried About Running Out of Food in the Last Year: Never true    Ran Out of Food in the Last Year: Never true  Transportation Needs: No Transportation Needs (08/26/2022)   PRAPARE - Administrator, Civil Service (Medical): No    Lack of Transportation (Non-Medical): No  Physical Activity: Inactive (08/26/2022)   Exercise Vital Sign    Days of Exercise per Week: 0 days    Minutes of Exercise per Session: 0 min  Stress: No Stress Concern Present (08/26/2022)   [employer] of Occupational Health - Occupational Stress Questionnaire    Feeling of Stress : Only a little  Social Connections: Moderately Integrated (08/26/2022)   Social Connection and Isolation Panel [NHANES]  Frequency of Communication with Friends and Family: More than three times a week    Frequency of Social Gatherings with Friends and Family: Three times a week    Attends Religious Services: More than 4 times per year    Active Member of Clubs or Organizations: Yes    Attends Banker Meetings: More than 4 times per year    Marital Status: Divorced   Allergies  Allergen Reactions   Doxycycline Diarrhea   Minocycline Diarrhea   Wellbutrin [Bupropion] Swelling    Per pt her tongue was swollen and sore/symptoms stopped once the medication was discontinued.    Penicillins Rash    Has patient had a PCN reaction causing immediate rash, facial/tongue/throat swelling, SOB or lightheadedness with hypotension: no Has patient had a PCN reaction causing severe rash  involving mucus membranes or skin necrosis: no Has patient had a PCN reaction that required hospitalization: unknown Has patient had a PCN reaction occurring within the last 10 years: no If all of the above answers are "NO", then may proceed with Cephalosporin use.    Z-Pak [Azithromycin] Other (See Comments)    diarrhea   Family History  Problem Relation Age of Onset   Diverticulosis Mother    Ovarian cancer Mother    Pancreatic cancer Father    Lung cancer Father    Colon polyps Sister    Heart failure Daughter    Diabetes Son    Colon cancer Neg Hx    Breast cancer Neg Hx    Esophageal cancer Neg Hx    Stomach cancer Neg Hx     Current Outpatient Medications (Endocrine & Metabolic):    denosumab (PROLIA) 60 MG/ML SOSY injection, INJECT 60MG  INTO THE SKIN AS DIRECTED  Current Facility-Administered Medications (Endocrine & Metabolic):    [START ON 06/01/2023] denosumab (PROLIA) injection 60 mg  Current Outpatient Medications (Cardiovascular):    amLODipine (NORVASC) 10 MG tablet,    atorvastatin (LIPITOR) 40 MG tablet, TAKE 1 TABLET(40 MG) BY MOUTH DAILY. FOLLOW-UP APPOINTMENT WITH LABS ARE DUE   hydrochlorothiazide (HYDRODIURIL) 25 MG tablet, TAKE 1 TABLET(25 MG) BY MOUTH EVERY MORNING   lisinopril (ZESTRIL) 40 MG tablet, TAKE 1 TABLET(40 MG) BY MOUTH DAILY   Current Outpatient Medications (Respiratory):    fluticasone (FLONASE) 50 MCG/ACT nasal spray, Place 2 sprays into both nostrils daily.     Current Outpatient Medications (Hematological):    Cyanocobalamin (VITAMIN B-12) 2500 MCG SUBL, Place 2,500 mcg under the tongue daily.   Current Outpatient Medications (Other):    ALPRAZolam (XANAX) 0.5 MG tablet, TAKE 1 TABLET(0.5 MG) BY MOUTH EVERY DAY   cholecalciferol (VITAMIN D) 25 MCG (1000 UNIT) tablet, Take 2,000 Units by mouth daily.   gabapentin (NEURONTIN) 100 MG capsule, Take 2 capsules (200 mg total) by mouth at bedtime as needed.   MAGNESIUM PO, Take by  mouth.   potassium chloride (KLOR-CON) 10 MEQ tablet, Take 1 tablet (10 mEq total) by mouth daily.   TURMERIC PO, Take 1 tablet by mouth daily.    Reviewed prior external information including notes and imaging from  primary care provider As well as notes that were available from care everywhere and other healthcare systems.  Past medical history, social, surgical and family history all reviewed in electronic medical record.  No pertanent information unless stated regarding to the chief complaint.   Review of Systems:  No headache, visual changes, nausea, vomiting, diarrhea, constipation, dizziness, abdominal pain, skin rash, fevers, chills, night sweats, weight loss,  swollen lymph nodes, body aches, joint swelling, chest pain, shortness of breath, mood changes. POSITIVE muscle aches  Objective  There were no vitals taken for this visit.   General: No apparent distress alert and oriented x3 mood and affect normal, dressed appropriately.  HEENT: Pupils equal, extraocular movements intact  Respiratory: Patient's speak in full sentences and does not appear short of breath  Cardiovascular: No lower extremity edema, non tender, no erythema  Low back exam shows    Impression and Recommendations:    The above documentation has been reviewed and is accurate and complete Judi Saa, DO

## 2022-12-30 ENCOUNTER — Telehealth: Payer: Self-pay | Admitting: Family Medicine

## 2022-12-30 NOTE — Telephone Encounter (Signed)
Pt scheduled for next week. Had recent MRI lumbar ordered by Dr. Denyse Amass.  Pt requesting Dr. Katrinka Blazing order repeat lumbar epidural, last done 10/25/2022.

## 2022-12-31 ENCOUNTER — Other Ambulatory Visit: Payer: Self-pay

## 2022-12-31 DIAGNOSIS — M5416 Radiculopathy, lumbar region: Secondary | ICD-10-CM

## 2022-12-31 NOTE — Progress Notes (Signed)
MRI of the low back shows areas where the nerves could be pinched especially causing your left leg pain.  You did have an injection right at this level in September which did not help.  I Dr. Katrinka Blazing ordered an injection today that should be helpful.  You should hear from [facility] imaging soon about scheduling this injection.  Recommend recheck with Dr. Katrinka Blazing or myself after this injection.

## 2023-01-05 ENCOUNTER — Ambulatory Visit (INDEPENDENT_AMBULATORY_CARE_PROVIDER_SITE_OTHER): Payer: 59 | Admitting: Gastroenterology

## 2023-01-05 ENCOUNTER — Encounter: Payer: Self-pay | Admitting: Gastroenterology

## 2023-01-05 VITALS — BP 122/80 | HR 91 | Ht 63.0 in | Wt 161.0 lb

## 2023-01-05 DIAGNOSIS — Z8719 Personal history of other diseases of the digestive system: Secondary | ICD-10-CM | POA: Diagnosis not present

## 2023-01-05 DIAGNOSIS — Z09 Encounter for follow-up examination after completed treatment for conditions other than malignant neoplasm: Secondary | ICD-10-CM

## 2023-01-05 DIAGNOSIS — R159 Full incontinence of feces: Secondary | ICD-10-CM

## 2023-01-05 NOTE — Progress Notes (Signed)
Chief Complaint: Follow-up of fecal incontinence Primary GI MD: Dr. Leonides Schanz  HPI: 77 year old female history of hypertension, COPD, diabetes, and others as listed below presents for follow-up of fecal incontinence.  Last seen 10/15/2022.   At that time she was having fecal incontinence for the last 6 months, had 2-3 episodes and also having nocturnal symptoms.  Having soft formed stools without difficulty and a decreased rectal tone on rectal exam was noted.  Colonoscopy was recommended but patient would prefer to avoid if possible.  Last visit she was scheduled to undergo pelvic floor physical therapy and recommended to get on a fiber supplement  Since her last appointment she attended consultation for pelvic floor physical therapy in which she "felt violated" and felt it was not helpful so she did not go back.  She then tried fiber Gummies and noticed some improvement.  She ultimately noticed her episodes of fecal incontinence were linked directly to dairy.  When she cut out dairy she stopped having fecal incontinence and she is doing well at this time.  She is still hesitant towards a colonoscopy would prefer to avoid.  Having no issues at this time easier.   PREVIOUS GI WORKUP   Colonoscopy 05/2012 - Numerous small diverticula in left colon - Otherwise normal - Repeat 10 years - Patient is not interested in repeat colonoscopy  Past Medical History:  Diagnosis Date   Arthritis    Bursitis    left elbow   Clostridium difficile infection    Diabetes mellitus without complication (HCC)    Frequent falls    HLD (hyperlipidemia)    Hypertension    Infectious colitis     Past Surgical History:  Procedure Laterality Date   CATARACT EXTRACTION Bilateral    COLONOSCOPY     KNEE SURGERY  2014   ORIF PROXIMAL TIBIAL PLATEAU FRACTURE Left 02/19/2012   knee   POLYPECTOMY     TONSILLECTOMY      Current Outpatient Medications  Medication Sig Dispense Refill   ALPRAZolam (XANAX) 0.5  MG tablet TAKE 1 TABLET(0.5 MG) BY MOUTH EVERY DAY 30 tablet 2   amLODipine (NORVASC) 10 MG tablet      atorvastatin (LIPITOR) 40 MG tablet TAKE 1 TABLET(40 MG) BY MOUTH DAILY. FOLLOW-UP APPOINTMENT WITH LABS ARE DUE 90 tablet 0   cholecalciferol (VITAMIN D) 25 MCG (1000 UNIT) tablet Take 2,000 Units by mouth daily.     Cyanocobalamin (VITAMIN B-12) 2500 MCG SUBL Place 2,500 mcg under the tongue daily.     denosumab (PROLIA) 60 MG/ML SOSY injection INJECT 60MG  INTO THE SKIN AS DIRECTED 1 mL 0   fluticasone (FLONASE) 50 MCG/ACT nasal spray Place 2 sprays into both nostrils daily. 16 g 6   gabapentin (NEURONTIN) 100 MG capsule Take 2 capsules (200 mg total) by mouth at bedtime as needed. 180 capsule 0   hydrochlorothiazide (HYDRODIURIL) 25 MG tablet TAKE 1 TABLET(25 MG) BY MOUTH EVERY MORNING 90 tablet 3   lisinopril (ZESTRIL) 40 MG tablet TAKE 1 TABLET(40 MG) BY MOUTH DAILY 90 tablet 2   MAGNESIUM PO Take by mouth.     potassium chloride (KLOR-CON) 10 MEQ tablet Take 1 tablet (10 mEq total) by mouth daily. 30 tablet 3   TURMERIC PO Take 1 tablet by mouth daily.     Current Facility-Administered Medications  Medication Dose Route Frequency Provider Last Rate Last Admin   [START ON 06/01/2023] denosumab (PROLIA) injection 60 mg  60 mg Subcutaneous Once Pincus Sanes, MD  Allergies as of 01/05/2023 - Review Complete 01/05/2023  Allergen Reaction Noted   Doxycycline Diarrhea 08/03/2011   Minocycline Diarrhea 08/03/2011   Wellbutrin [bupropion] Swelling 07/10/2014   Penicillins Rash 12/18/2010   Z-pak [azithromycin] Other (See Comments) 02/12/2020    Family History  Problem Relation Age of Onset   Diverticulosis Mother    Ovarian cancer Mother    Pancreatic cancer Father    Lung cancer Father    Colon polyps Sister    Heart failure Daughter    Diabetes Son    Colon cancer Neg Hx    Breast cancer Neg Hx    Esophageal cancer Neg Hx    Stomach cancer Neg Hx     Social History    Socioeconomic History   Marital status: Divorced    Spouse name: Not on file   Number of children: 2   Years of education: Not on file   Highest education level: Some college, no degree  Occupational History   Occupation: RETIRED  Tobacco Use   Smoking status: Former    Current packs/day: 0.00    Average packs/day: 0.3 packs/day for 50.0 years (12.5 ttl pk-yrs)    Types: Cigarettes    Start date: 08/27/1964    Quit date: 08/28/2014    Years since quitting: 8.3   Smokeless tobacco: Never   Tobacco comments:    1 pack every 3 days  Vaping Use   Vaping status: Never Used  Substance and Sexual Activity   Alcohol use: No    Alcohol/week: 0.0 standard drinks of alcohol   Drug use: No   Sexual activity: Not Currently  Other Topics Concern   Not on file  Social History Narrative   Lives alone    Right handed   Caffeine: 3x weekly         Social Determinants of Health   Financial Resource Strain: Low Risk  (08/26/2022)   Overall Financial Resource Strain (CARDIA)    Difficulty of Paying Living Expenses: Not hard at all  Food Insecurity: No Food Insecurity (08/26/2022)   Hunger Vital Sign    Worried About Running Out of Food in the Last Year: Never true    Ran Out of Food in the Last Year: Never true  Transportation Needs: No Transportation Needs (08/26/2022)   PRAPARE - Administrator, Civil Service (Medical): No    Lack of Transportation (Non-Medical): No  Physical Activity: Inactive (08/26/2022)   Exercise Vital Sign    Days of Exercise per Week: 0 days    Minutes of Exercise per Session: 0 min  Stress: No Stress Concern Present (08/26/2022)   [employer] of Occupational Health - Occupational Stress Questionnaire    Feeling of Stress : Only a little  Social Connections: Moderately Integrated (08/26/2022)   Social Connection and Isolation Panel [NHANES]    Frequency of Communication with Friends and Family: More than three times a week    Frequency of Social  Gatherings with Friends and Family: Three times a week    Attends Religious Services: More than 4 times per year    Active Member of Clubs or Organizations: Yes    Attends Banker Meetings: More than 4 times per year    Marital Status: Divorced  Intimate Partner Violence: Not At Risk (08/26/2022)   Humiliation, Afraid, Rape, and Kick questionnaire    Fear of Current or Ex-Partner: No    Emotionally Abused: No    Physically Abused: No  Sexually Abused: No    Review of Systems:    Constitutional: No weight loss, fever, chills, weakness or fatigue HEENT: Eyes: No change in vision               Ears, Nose, Throat:  No change in hearing or congestion Skin: No rash or itching Cardiovascular: No chest pain, chest pressure or palpitations   Respiratory: No SOB or cough Gastrointestinal: See HPI and otherwise negative Genitourinary: No dysuria or change in urinary frequency Neurological: No headache, dizziness or syncope Musculoskeletal: No new muscle or joint pain Hematologic: No bleeding or bruising Psychiatric: No history of depression or anxiety    Physical Exam:  Vital signs: BP 122/80 (BP Location: Left Arm, Patient Position: Sitting, Cuff Size: Normal)   Pulse 91   Ht 5\' 3"  (1.6 m)   Wt 161 lb (73 kg)   SpO2 98%   BMI 28.52 kg/m   Constitutional: NAD, Well developed, Well nourished, alert and cooperative Head:  Normocephalic and atraumatic. Eyes:   PEERL, EOMI. No icterus. Conjunctiva pink. Rectal:  Not performed.  Msk:  Symmetrical without gross deformities. Without edema, no deformity or joint abnormality.  Neurologic:  Alert and  oriented x4;  grossly normal neurologically.  Skin:   Dry and intact without significant lesions or rashes. Psychiatric: Oriented to person, place and time. Demonstrates good judgement and reason without abnormal affect or behaviors.   RELEVANT LABS AND IMAGING: CBC    Component Value Date/Time   WBC 6.3 09/09/2022 0911    RBC 5.12 (H) 09/09/2022 0911   HGB 13.7 09/09/2022 0911   HCT 43.2 09/09/2022 0911   PLT 319.0 09/09/2022 0911   MCV 84.4 09/09/2022 0911   MCH 27.1 01/28/2022 1605   MCHC 31.7 09/09/2022 0911   RDW 15.7 (H) 09/09/2022 0911   LYMPHSABS 2.5 09/09/2022 0911   MONOABS 0.7 09/09/2022 0911   EOSABS 0.1 09/09/2022 0911   BASOSABS 0.1 09/09/2022 0911    CMP     Component Value Date/Time   NA 139 09/09/2022 0911   K 3.4 (L) 09/09/2022 0911   CL 101 09/09/2022 0911   CO2 30 09/09/2022 0911   GLUCOSE 97 09/09/2022 0911   BUN 17 09/09/2022 0911   CREATININE 0.80 09/09/2022 0911   CREATININE 0.73 10/20/2012 1145   CALCIUM 9.2 09/09/2022 0911   PROT 7.2 09/09/2022 0911   ALBUMIN 4.4 09/09/2022 0911   AST 19 09/09/2022 0911   ALT 25 09/09/2022 0911   ALKPHOS 45 09/09/2022 0911   BILITOT 0.4 09/09/2022 0911   GFRNONAA >60 01/28/2022 1605   GFRAA >60 04/18/2017 1431     Assessment/Plan:   Fecal incontinence Improved after cutting out dairy.  Not amenable to pelvic floor physical therapy or colonoscopy at this time.  No longer having fecal incontinence and having normal soft formed bowel movements without difficulty. - Continue to avoid lactose -- can try lactacid if she wants to have something with lactose and see if it helps - Discussed if things change we be happy to do a colonoscopy as she appears significantly younger than stated age - If problems occur please let us know, otherwise follow-up prn.   Lara Mulch [facility] 01/05/2023, 3:39 PM  Cc: Pincus Sanes, MD

## 2023-01-05 NOTE — Progress Notes (Unsigned)
[facility] [address] [place] [address] Phone: [phone] Subjective:   Kristina Ayala Ayala, am serving as a scribe for Dr. Antoine Primas.  I'm seeing this patient by the request  of:  Pincus Sanes, MD  CC: Left knee pain and back pain  [id number]  11/15/2022 Seems to be doing much better after the injections at this time.  I do not see any other need for another epidural at this time.  Discussed with patient about increase activity slowly otherwise.  Discussed icing regimen at home exercises, discussed avoiding certain activities.  Follow-up with me again in 6 to 8 weeks otherwise.      Update 01/06/2023 Kristina Ayala Ayala is a 77 y.o. female coming in with complaint of lumbar spine and L knee pain. Epidural scheduled for 01/17/2023. Patient states wants knee injections. Take a look at the L knee.     Past Medical History:  Diagnosis Date   Arthritis    Bursitis    left elbow   Clostridium difficile infection    Diabetes mellitus without complication (HCC)    Frequent falls    HLD (hyperlipidemia)    Hypertension    Infectious colitis    Past Surgical History:  Procedure Laterality Date   CATARACT EXTRACTION Bilateral    COLONOSCOPY     KNEE SURGERY  2014   ORIF PROXIMAL TIBIAL PLATEAU FRACTURE Left 02/19/2012   knee   POLYPECTOMY     TONSILLECTOMY     Social History   Socioeconomic History   Marital status: Divorced    Spouse name: Not on file   Number of children: 2   Years of education: Not on file   Highest education level: Some college, no degree  Occupational History   Occupation: RETIRED  Tobacco Use   Smoking status: Former    Current packs/day: 0.00    Average packs/day: 0.3 packs/day for 50.0 years (12.5 ttl pk-yrs)    Types: Cigarettes    Start date: 08/27/1964    Quit date: 08/28/2014    Years since quitting: 8.3   Smokeless tobacco: Never   Tobacco comments:    1 pack every 3 days  Vaping Use    Vaping status: Never Used  Substance and Sexual Activity   Alcohol use: No    Alcohol/week: 0.0 standard drinks of alcohol   Drug use: No   Sexual activity: Not Currently  Other Topics Concern   Not on file  Social History Narrative   Lives alone    Right handed   Caffeine: 3x weekly         Social Drivers of Health   Financial Resource Strain: Low Risk  (08/26/2022)   Overall Financial Resource Strain (CARDIA)    Difficulty of Paying Living Expenses: Not hard at all  Food Insecurity: No Food Insecurity (08/26/2022)   Hunger Vital Sign    Worried About Running Out of Food in the Last Year: Never true    Ran Out of Food in the Last Year: Never true  Transportation Needs: No Transportation Needs (08/26/2022)   PRAPARE - Administrator, Civil Service (Medical): No    Lack of Transportation (Non-Medical): No  Physical Activity: Inactive (08/26/2022)   Exercise Vital Sign    Days of Exercise per Week: 0 days    Minutes of Exercise per Session: 0 min  Stress: No Stress Concern Present (08/26/2022)   [employer] of Occupational Health - Occupational Stress  Questionnaire    Feeling of Stress : Only a little  Social Connections: Moderately Integrated (08/26/2022)   Social Connection and Isolation Panel [NHANES]    Frequency of Communication with Friends and Family: More than three times a week    Frequency of Social Gatherings with Friends and Family: Three times a week    Attends Religious Services: More than 4 times per year    Active Member of Clubs or Organizations: Yes    Attends Banker Meetings: More than 4 times per year    Marital Status: Divorced   Allergies  Allergen Reactions   Doxycycline Diarrhea   Minocycline Diarrhea   Wellbutrin [Bupropion] Swelling    Per pt her tongue was swollen and sore/symptoms stopped once the medication was discontinued.    Penicillins Rash    Has patient had a PCN reaction causing immediate rash,  facial/tongue/throat swelling, SOB or lightheadedness with hypotension: no Has patient had a PCN reaction causing severe rash involving mucus membranes or skin necrosis: no Has patient had a PCN reaction that required hospitalization: unknown Has patient had a PCN reaction occurring within the last 10 years: no If all of the above answers are "NO", then may proceed with Cephalosporin use.    Z-Pak [Azithromycin] Other (See Comments)    diarrhea   Family History  Problem Relation Age of Onset   Diverticulosis Mother    Ovarian cancer Mother    Pancreatic cancer Father    Lung cancer Father    Colon polyps Sister    Heart failure Daughter    Diabetes Son    Colon cancer Neg Hx    Breast cancer Neg Hx    Esophageal cancer Neg Hx    Stomach cancer Neg Hx     Current Outpatient Medications (Endocrine & Metabolic):    denosumab (PROLIA) 60 MG/ML SOSY injection, INJECT 60MG  INTO THE SKIN AS DIRECTED  Current Facility-Administered Medications (Endocrine & Metabolic):    [START ON 06/01/2023] denosumab (PROLIA) injection 60 mg  Current Outpatient Medications (Cardiovascular):    amLODipine (NORVASC) 10 MG tablet,    atorvastatin (LIPITOR) 40 MG tablet, TAKE 1 TABLET(40 MG) BY MOUTH DAILY. FOLLOW-UP APPOINTMENT WITH LABS ARE DUE   hydrochlorothiazide (HYDRODIURIL) 25 MG tablet, TAKE 1 TABLET(25 MG) BY MOUTH EVERY MORNING   lisinopril (ZESTRIL) 40 MG tablet, TAKE 1 TABLET(40 MG) BY MOUTH DAILY   Current Outpatient Medications (Respiratory):    fluticasone (FLONASE) 50 MCG/ACT nasal spray, Place 2 sprays into both nostrils daily.     Current Outpatient Medications (Hematological):    Cyanocobalamin (VITAMIN B-12) 2500 MCG SUBL, Place 2,500 mcg under the tongue daily.   Current Outpatient Medications (Other):    ALPRAZolam (XANAX) 0.5 MG tablet, TAKE 1 TABLET(0.5 MG) BY MOUTH EVERY DAY   cholecalciferol (VITAMIN D) 25 MCG (1000 UNIT) tablet, Take 2,000 Units by mouth daily.    gabapentin (NEURONTIN) 100 MG capsule, Take 2 capsules (200 mg total) by mouth at bedtime as needed.   MAGNESIUM PO, Take by mouth.   potassium chloride (KLOR-CON) 10 MEQ tablet, Take 1 tablet (10 mEq total) by mouth daily.   TURMERIC PO, Take 1 tablet by mouth daily.    Reviewed prior external information including notes and imaging from  primary care provider As well as notes that were available from care everywhere and other healthcare systems.  Past medical history, social, surgical and family history all reviewed in electronic medical record.  No pertanent information unless stated regarding to  the chief complaint.   Review of Systems:  No headache, visual changes, nausea, vomiting, diarrhea, constipation, dizziness, abdominal pain, skin rash, fevers, chills, night sweats, weight loss, swollen lymph nodes, body aches, joint swelling, chest pain, shortness of breath, mood changes. POSITIVE muscle aches  Objective  Blood pressure (!) 136/90, pulse 89, height 5\' 3"  (1.6 m), weight 167 lb (75.8 kg), SpO2 96%.   General: No apparent distress alert and oriented x3 mood and affect normal, dressed appropriately.  HEENT: Pupils equal, extraocular movements intact  Respiratory: Patient's speak in full sentences and does not appear short of breath  Cardiovascular: No lower extremity edema, non tender, no erythema  Back pain does have some loss lordosis noted.  Does have positive straight leg test on the right side noted.  Negative straight leg test at the moment but worsening pain with extension of the back that causes some radicular symptoms. Left knee no does have a trace effusion noted.  Still has the instability with valgus and varus force.  Postsurgical changes still noted  After informed written and verbal consent, patient was seated on exam table. Left knee was prepped with alcohol swab and utilizing anterolateral approach, patient's left knee space was injected with 4:1  marcaine 0.5%:  Kenalog 40mg /dL. Patient tolerated the procedure well without immediate complications.    Impression and Recommendations:    The above documentation has been reviewed and is accurate and complete Judi Saa, DO

## 2023-01-06 ENCOUNTER — Ambulatory Visit: Payer: 59 | Admitting: Family Medicine

## 2023-01-06 ENCOUNTER — Encounter: Payer: Self-pay | Admitting: Family Medicine

## 2023-01-06 VITALS — BP 136/90 | HR 89 | Ht 63.0 in | Wt 167.0 lb

## 2023-01-06 DIAGNOSIS — M1732 Unilateral post-traumatic osteoarthritis, left knee: Secondary | ICD-10-CM | POA: Diagnosis not present

## 2023-01-06 DIAGNOSIS — R269 Unspecified abnormalities of gait and mobility: Secondary | ICD-10-CM | POA: Diagnosis not present

## 2023-01-06 DIAGNOSIS — G8929 Other chronic pain: Secondary | ICD-10-CM | POA: Diagnosis not present

## 2023-01-06 DIAGNOSIS — M5416 Radiculopathy, lumbar region: Secondary | ICD-10-CM | POA: Diagnosis not present

## 2023-01-06 DIAGNOSIS — M25562 Pain in left knee: Secondary | ICD-10-CM

## 2023-01-06 NOTE — Assessment & Plan Note (Signed)
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  I do believe that possibly some mild weakness from the S1 distribution could be contributing to increasing the discomfort.  Patient's tibial plateau fracture does not have any intra-articular hardware.  We discussed different signs and symptoms and the risk again with this.  Patient was in agreement and wanted to have the injection.  Has done well with these over the course of the last 6 years.  Patient will continue to try to be active.  Is scheduled to have an epidural in the back in the near future.  Patient knows if worsening pain to seek medical attention immediately.  Patient will follow-up with me again in 6 to 8 weeks

## 2023-01-06 NOTE — Patient Instructions (Addendum)
Injection in knee today Happy Holidays See you again in 6-8 weeks

## 2023-01-06 NOTE — Assessment & Plan Note (Signed)
Scheduled for a another epidural in the near future.  MRI shows some progression of the arthritic changes with some nerve root impingement recently.  This was independently visualized by me.  Patient will call the imaging center to see if there is any chance for a earlier appointment but otherwise we will continue with current therapy.  6 weeks after injection would like to follow-up

## 2023-01-06 NOTE — Progress Notes (Signed)
I agree with the assessment and plan as outlined by Ms. McMichael. 

## 2023-01-12 ENCOUNTER — Encounter: Payer: Self-pay | Admitting: Internal Medicine

## 2023-01-12 NOTE — Patient Instructions (Addendum)

## 2023-01-12 NOTE — Progress Notes (Unsigned)
Subjective:    Patient ID: Kristina Ayala, female    DOB: 12-01-1945, 77 y.o.   MRN: [id number]      HPI Kristina Ayala is here for a Physical exam and her chronic medical problems.   Discovered that dairy was causing diarrhea and has stopped that - BM are normal  Still trying to get neuro appt - she is working on it.  Overall feels energy and stamina is low.  She cannot do what she used to do.  She is not exercising regularly.   Medications and allergies reviewed with patient and updated if appropriate.  Current Outpatient Medications on File Prior to Visit  Medication Sig Dispense Refill   amLODipine (NORVASC) 10 MG tablet      cholecalciferol (VITAMIN D) 25 MCG (1000 UNIT) tablet Take 2,000 Units by mouth daily.     Cyanocobalamin (VITAMIN B-12) 2500 MCG SUBL Place 2,500 mcg under the tongue daily.     denosumab (PROLIA) 60 MG/ML SOSY injection INJECT 60MG  INTO THE SKIN AS DIRECTED 1 mL 0   fluticasone (FLONASE) 50 MCG/ACT nasal spray Place 2 sprays into both nostrils daily. 16 g 6   gabapentin (NEURONTIN) 100 MG capsule Take 2 capsules (200 mg total) by mouth at bedtime as needed. 180 capsule 0   hydrochlorothiazide (HYDRODIURIL) 25 MG tablet TAKE 1 TABLET(25 MG) BY MOUTH EVERY MORNING 90 tablet 3   lisinopril (ZESTRIL) 40 MG tablet TAKE 1 TABLET(40 MG) BY MOUTH DAILY 90 tablet 2   MAGNESIUM PO Take by mouth.     potassium chloride (KLOR-CON) 10 MEQ tablet Take 1 tablet (10 mEq total) by mouth daily. 30 tablet 3   TURMERIC PO Take 1 tablet by mouth daily.     Current Facility-Administered Medications on File Prior to Visit  Medication Dose Route Frequency Provider Last Rate Last Admin   [START ON 06/01/2023] denosumab (PROLIA) injection 60 mg  60 mg Subcutaneous Once Pincus Sanes, MD        Review of Systems  Constitutional:  Negative for fever.  Eyes:  Negative for visual disturbance.  Respiratory:  Negative for cough, shortness of breath and wheezing.    Cardiovascular:  Negative for chest pain, palpitations and leg swelling.  Gastrointestinal:  Negative for abdominal pain, blood in stool, constipation and diarrhea.       No gerd  Genitourinary:  Negative for dysuria.  Musculoskeletal:  Positive for arthralgias (knee- left) and back pain.  Skin:  Negative for rash.  Neurological:  Positive for headaches (occ). Negative for light-headedness.  Psychiatric/Behavioral:  Negative for dysphoric mood. The patient is nervous/anxious (situational).        Objective:   Vitals:   01/13/23 0921 01/13/23 1012  BP: (!) 120/92 120/80  Pulse: 88   Temp: 98.1 F (36.7 C)   SpO2: 99%    Filed Weights   01/13/23 0921  Weight: 163 lb (73.9 kg)   Body mass index is 28.87 kg/m.  BP Readings from Last 3 Encounters:  01/13/23 120/80  01/06/23 (!) 136/90  01/05/23 122/80    Wt Readings from Last 3 Encounters:  01/13/23 163 lb (73.9 kg)  01/06/23 167 lb (75.8 kg)  01/05/23 161 lb (73 kg)       Physical Exam Constitutional: She appears well-developed and well-nourished. No distress.  HENT:  Head: Normocephalic and atraumatic.  Right Ear: External ear normal. Normal ear canal and TM Left Ear: External ear normal.  Normal ear canal and TM Mouth/Throat: Oropharynx  is clear and moist.  Eyes: Conjunctivae normal.  Neck: Neck supple. No tracheal deviation present. No thyromegaly present.  No carotid bruit  Cardiovascular: Normal rate, regular rhythm and normal heart sounds.   No murmur heard.  No edema. Pulmonary/Chest: Effort normal and breath sounds normal. No respiratory distress. She has no wheezes. She has no rales.  Breast: deferred   Abdominal: Soft. She exhibits no distension. There is no tenderness.  Lymphadenopathy: She has no cervical adenopathy.  Skin: Skin is warm and dry. She is not diaphoretic.  Psychiatric: She has a normal mood and affect. Her behavior is normal.     Lab Results  Component Value Date   WBC 6.3  09/09/2022   HGB 13.7 09/09/2022   HCT 43.2 09/09/2022   PLT 319.0 09/09/2022   GLUCOSE 97 09/09/2022   CHOL 162 07/13/2022   TRIG 88.0 07/13/2022   HDL 50.60 07/13/2022   LDLDIRECT 213.1 05/22/2012   LDLCALC 93 07/13/2022   ALT 25 09/09/2022   AST 19 09/09/2022   NA 139 09/09/2022   K 3.4 (L) 09/09/2022   CL 101 09/09/2022   CREATININE 0.80 09/09/2022   BUN 17 09/09/2022   CO2 30 09/09/2022   TSH 0.67 09/09/2022   HGBA1C 6.2 09/09/2022   MICROALBUR <0.7 01/05/2022         Assessment & Plan:   Physical exam: Screening blood work  ordered Exercise  not regular -- stressed regular exercise Weight  overweight - encouraged weight los Substance abuse  none   Reviewed recommended immunizations.   Health Maintenance  Topic Date Due   FOOT EXAM  Never done   OPHTHALMOLOGY EXAM  09/03/2021   Diabetic kidney evaluation - Urine ACR  01/06/2023   COVID-19 Vaccine (4 - 2024-25 season) 01/29/2023 (Originally 09/26/2022)   HEMOGLOBIN A1C  03/12/2023   Medicare Annual Wellness (AWV)  08/26/2023   Diabetic kidney evaluation - eGFR measurement  09/09/2023   DTaP/Tdap/Td (2 - Td or Tdap) 07/28/2024   DEXA SCAN  11/10/2024   Pneumonia Vaccine 34+ Years old  Completed   INFLUENZA VACCINE  Completed   Hepatitis C Screening  Completed   HPV VACCINES  Aged Out   Colonoscopy  Discontinued   Zoster Vaccines- Shingrix  Discontinued          See Problem List for Assessment and Plan of chronic medical problems.

## 2023-01-13 ENCOUNTER — Ambulatory Visit: Payer: 59 | Admitting: Internal Medicine

## 2023-01-13 VITALS — BP 120/80 | HR 88 | Temp 98.1°F | Ht 63.0 in | Wt 163.0 lb

## 2023-01-13 DIAGNOSIS — M810 Age-related osteoporosis without current pathological fracture: Secondary | ICD-10-CM | POA: Diagnosis not present

## 2023-01-13 DIAGNOSIS — K8689 Other specified diseases of pancreas: Secondary | ICD-10-CM | POA: Diagnosis not present

## 2023-01-13 DIAGNOSIS — I700 Atherosclerosis of aorta: Secondary | ICD-10-CM | POA: Diagnosis not present

## 2023-01-13 DIAGNOSIS — E782 Mixed hyperlipidemia: Secondary | ICD-10-CM | POA: Diagnosis not present

## 2023-01-13 DIAGNOSIS — I10 Essential (primary) hypertension: Secondary | ICD-10-CM

## 2023-01-13 DIAGNOSIS — E876 Hypokalemia: Secondary | ICD-10-CM | POA: Diagnosis not present

## 2023-01-13 DIAGNOSIS — E119 Type 2 diabetes mellitus without complications: Secondary | ICD-10-CM

## 2023-01-13 DIAGNOSIS — J438 Other emphysema: Secondary | ICD-10-CM

## 2023-01-13 DIAGNOSIS — Z0000 Encounter for general adult medical examination without abnormal findings: Secondary | ICD-10-CM | POA: Diagnosis not present

## 2023-01-13 DIAGNOSIS — F419 Anxiety disorder, unspecified: Secondary | ICD-10-CM

## 2023-01-13 DIAGNOSIS — G2581 Restless legs syndrome: Secondary | ICD-10-CM | POA: Diagnosis not present

## 2023-01-13 DIAGNOSIS — E1169 Type 2 diabetes mellitus with other specified complication: Secondary | ICD-10-CM | POA: Diagnosis not present

## 2023-01-13 LAB — LIPID PANEL
Cholesterol: 165 mg/dL (ref 0–200)
HDL: 53.3 mg/dL (ref >39.00)
LDL Cholesterol: 92 mg/dL (ref 0–99)
NonHDL: 111.65
Total CHOL/HDL Ratio: 3
Triglycerides: 96 mg/dL (ref 0.0–149.0)
VLDL: 19.2 mg/dL (ref 0.0–40.0)

## 2023-01-13 LAB — COMPREHENSIVE METABOLIC PANEL
ALT: 24 U/L (ref 0–35)
AST: 19 U/L (ref 0–37)
Albumin: 4.4 g/dL (ref 3.5–5.2)
Alkaline Phosphatase: 57 U/L (ref 39–117)
BUN: 11 mg/dL (ref 6–23)
CO2: 28 meq/L (ref 19–32)
Calcium: 9.5 mg/dL (ref 8.4–10.5)
Chloride: 106 meq/L (ref 96–112)
Creatinine, Ser: 0.73 mg/dL (ref 0.40–1.20)
GFR: 78.54 mL/min (ref >60.00)
Glucose, Bld: 96 mg/dL (ref 70–99)
Potassium: 4 meq/L (ref 3.5–5.1)
Sodium: 141 meq/L (ref 135–145)
Total Bilirubin: 0.4 mg/dL (ref 0.2–1.2)
Total Protein: 7.2 g/dL (ref 6.0–8.3)

## 2023-01-13 LAB — CBC WITH DIFFERENTIAL/PLATELET
Basophils Absolute: 0 10*3/uL (ref 0.0–0.1)
Basophils Relative: 0.7 % (ref 0.0–3.0)
Eosinophils Absolute: 0.1 10*3/uL (ref 0.0–0.7)
Eosinophils Relative: 2 % (ref 0.0–5.0)
HCT: 42.1 % (ref 36.0–46.0)
Hemoglobin: 13.8 g/dL (ref 12.0–15.0)
Lymphocytes Relative: 40.9 % (ref 12.0–46.0)
Lymphs Abs: 2.7 10*3/uL (ref 0.7–4.0)
MCHC: 32.8 g/dL (ref 30.0–36.0)
MCV: 83.3 fL (ref 78.0–100.0)
Monocytes Absolute: 0.6 10*3/uL (ref 0.1–1.0)
Monocytes Relative: 9 % (ref 3.0–12.0)
Neutro Abs: 3.1 10*3/uL (ref 1.4–7.7)
Neutrophils Relative %: 47.4 % (ref 43.0–77.0)
Platelets: 323 10*3/uL (ref 150.0–400.0)
RBC: 5.05 Mil/uL (ref 3.87–5.11)
RDW: 15.3 % (ref 11.5–15.5)
WBC: 6.5 10*3/uL (ref 4.0–10.5)

## 2023-01-13 LAB — MICROALBUMIN / CREATININE URINE RATIO
Creatinine,U: 53.1 mg/dL
Microalb Creat Ratio: 1.3 mg/g (ref 0.0–30.0)
Microalb, Ur: <0.7 mg/dL (ref 0.0–1.9)

## 2023-01-13 LAB — TSH: TSH: 0.65 u[IU]/mL (ref 0.35–5.50)

## 2023-01-13 LAB — HEMOGLOBIN A1C: Hgb A1c MFr Bld: 6.2 % (ref 4.6–6.5)

## 2023-01-13 MED ORDER — ATORVASTATIN CALCIUM 40 MG PO TABS: ORAL_TABLET | ORAL | 1 refills | Status: DC

## 2023-01-13 MED ORDER — ALPRAZOLAM 0.5 MG PO TABS: ORAL_TABLET | ORAL | 5 refills | Status: DC

## 2023-01-13 NOTE — Assessment & Plan Note (Signed)
Chronic cmp Continue potassium chloride 10 mEq daily

## 2023-01-13 NOTE — Assessment & Plan Note (Addendum)
Chronic On Prolia-continue every 6 months Last Prolia 11/2022 DEXA up-to-date Stressed regular exercise Continue vitamin D supplementation start calcium supplementation and trying to eat a calcium rich diet Check vitamin D level

## 2023-01-13 NOTE — Assessment & Plan Note (Signed)
Chronic controlled Continue alprazolam 0.5 mg nightly as needed-does not take often

## 2023-01-13 NOTE — Assessment & Plan Note (Signed)
Chronic Lab Results  Component Value Date   LDLCALC 93 07/13/2022   Currently on atorvastatin 40 mg qd Check lipids Stressed healthy diet, as much activity as tolerated

## 2023-01-13 NOTE — Assessment & Plan Note (Signed)
Chronic Blood pressure controlled Cmp, cbc Continue amlodipine 10 mg at night, hydrochlorothiazide 25 mg, lisinopril 40 mg in the morning

## 2023-01-13 NOTE — Assessment & Plan Note (Signed)
Last MR 2 years ago - follow up MR advised after 2 years - follow up due in one year  - 01/2024

## 2023-01-13 NOTE — Assessment & Plan Note (Addendum)
Chronic With hyperlipidemia  Lab Results  Component Value Date   HGBA1C 6.2 09/09/2022   Sugars well controlled Check A1c, urine microalbumin Continue lifestyle control Stressed regular exercise

## 2023-01-13 NOTE — Assessment & Plan Note (Signed)
Chronic No symptoms No cough, wheeze, SOB

## 2023-01-13 NOTE — Assessment & Plan Note (Signed)
Chronic Has intermittent at night

## 2023-01-13 NOTE — Assessment & Plan Note (Signed)
Chronic Regular exercise and healthy diet encouraged Check lipid panel, CMP Continue atorvastatin 40 mg daily 

## 2023-01-14 NOTE — Discharge Instructions (Signed)

## 2023-01-17 ENCOUNTER — Ambulatory Visit
Admission: RE | Admit: 2023-01-17 | Discharge: 2023-01-17 | Disposition: A | Discharge status: Home / Self Care | Payer: 59 | Source: Ambulatory Visit | Attending: Family Medicine | Admitting: Family Medicine

## 2023-01-17 DIAGNOSIS — M5416 Radiculopathy, lumbar region: Secondary | ICD-10-CM

## 2023-01-17 DIAGNOSIS — M4727 Other spondylosis with radiculopathy, lumbosacral region: Secondary | ICD-10-CM | POA: Diagnosis not present

## 2023-01-17 MED ORDER — METHYLPREDNISOLONE ACETATE 40 MG/ML INJ SUSP (RADIOLOG
80.0000 mg | Freq: Once | INTRAMUSCULAR | Status: AC
  Administered 2023-01-17: 80 mg via EPIDURAL

## 2023-01-17 MED ORDER — IOPAMIDOL (ISOVUE-M 200) INJECTION 41%
1.0000 mL | Freq: Once | INTRAMUSCULAR | Status: AC
  Administered 2023-01-17: 1 mL via EPIDURAL

## 2023-01-25 ENCOUNTER — Ambulatory Visit: Payer: 59

## 2023-02-03 ENCOUNTER — Telehealth: Payer: Self-pay | Admitting: Family Medicine

## 2023-02-03 DIAGNOSIS — H52203 Unspecified astigmatism, bilateral: Secondary | ICD-10-CM | POA: Diagnosis not present

## 2023-02-03 DIAGNOSIS — Z961 Presence of intraocular lens: Secondary | ICD-10-CM | POA: Diagnosis not present

## 2023-02-03 DIAGNOSIS — H43813 Vitreous degeneration, bilateral: Secondary | ICD-10-CM | POA: Diagnosis not present

## 2023-02-03 DIAGNOSIS — H04123 Dry eye syndrome of bilateral lacrimal glands: Secondary | ICD-10-CM | POA: Diagnosis not present

## 2023-02-03 DIAGNOSIS — R7303 Prediabetes: Secondary | ICD-10-CM | POA: Diagnosis not present

## 2023-02-03 LAB — HM DIABETES EYE EXAM

## 2023-02-03 NOTE — Telephone Encounter (Signed)
 Patient called to let Dr Katrinka Blazing know that she fell on New Years and has still been having a lot of pain. She fell on her left side but her pain is mostly at her lower right back and hip. She asked if Dr Katrinka Blazing would order an xray for her?  Please advise.

## 2023-02-04 ENCOUNTER — Other Ambulatory Visit: Payer: Self-pay

## 2023-02-04 DIAGNOSIS — M545 Low back pain, unspecified: Secondary | ICD-10-CM

## 2023-02-04 DIAGNOSIS — M25551 Pain in right hip: Secondary | ICD-10-CM

## 2023-02-07 NOTE — Progress Notes (Deleted)
    Ben Jackson D.[facility] [address] [place] [address] Phone: [phone]   Assessment and Plan:     There are no diagnoses linked to this encounter.  ***   Pertinent previous records reviewed include ***    Follow Up: ***     Subjective:   I, Alyxandra Tenbrink, am serving as a neurosurgeon for Doctor Morene Mace  Chief Complaint: hip and left knee pain   HPI:   02/08/2023 Patient is a 78 year old female with hip and left knee pain. Patient states  Relevant Historical Information: ***  Additional pertinent review of systems negative.   Current Outpatient Medications:    ALPRAZolam  (XANAX ) 0.5 MG tablet, TAKE 1 TABLET(0.5 MG) BY MOUTH EVERY DAY, Disp: 30 tablet, Rfl: 5   amLODipine  (NORVASC ) 10 MG tablet, , Disp: , Rfl:    atorvastatin  (LIPITOR) 40 MG tablet, TAKE 1 TABLET(40 MG) BY MOUTH DAILY. FOLLOW-UP APPOINTMENT WITH LABS ARE DUE, Disp: 90 tablet, Rfl: 1   cholecalciferol (VITAMIN D ) 25 MCG (1000 UNIT) tablet, Take 2,000 Units by mouth daily., Disp: , Rfl:    Cyanocobalamin  (VITAMIN B-12) 2500 MCG SUBL, Place 2,500 mcg under the tongue daily., Disp: , Rfl:    denosumab  (PROLIA ) 60 MG/ML SOSY injection, INJECT 60MG  INTO THE SKIN AS DIRECTED, Disp: 1 mL, Rfl: 0   fluticasone  (FLONASE ) 50 MCG/ACT nasal spray, Place 2 sprays into both nostrils daily., Disp: 16 g, Rfl: 6   gabapentin  (NEURONTIN ) 100 MG capsule, Take 2 capsules (200 mg total) by mouth at bedtime as needed., Disp: 180 capsule, Rfl: 0   hydrochlorothiazide  (HYDRODIURIL ) 25 MG tablet, TAKE 1 TABLET(25 MG) BY MOUTH EVERY MORNING, Disp: 90 tablet, Rfl: 3   lisinopril  (ZESTRIL ) 40 MG tablet, TAKE 1 TABLET(40 MG) BY MOUTH DAILY, Disp: 90 tablet, Rfl: 2   MAGNESIUM  PO, Take by mouth., Disp: , Rfl:    potassium chloride  (KLOR-CON ) 10 MEQ tablet, Take 1 tablet (10 mEq total) by mouth daily., Disp: 30 tablet, Rfl: 3   TURMERIC PO, Take 1 tablet by mouth daily., Disp: , Rfl:    Current Facility-Administered Medications:    [START ON 06/01/2023] denosumab  (PROLIA ) injection 60 mg, 60 mg, Subcutaneous, Once, Burns, Glade PARAS, MD   Objective:     There were no vitals filed for this visit.    There is no height or weight on file to calculate BMI.    Physical Exam:    ***   Electronically signed by:  Odis Mace D.[facility] 2:51 PM 02/07/23

## 2023-02-08 ENCOUNTER — Ambulatory Visit: Payer: 59 | Admitting: Sports Medicine

## 2023-02-18 NOTE — Progress Notes (Unsigned)
[name] [address] [place] [address] Phone: [phone] Subjective:   Kristina Ayala, am serving as a scribe for Dr. Antoine Primas.  I'm seeing this patient by the request  of:  Pincus Sanes, MD  CC: multiple complaint follow up   [id number]  01/06/2023 Scheduled for a another epidural in the near future.  MRI shows some progression of the arthritic changes with some nerve root impingement recently.  This was independently visualized by me.  Patient will call the imaging center to see if there is any chance for a earlier appointment but otherwise we will continue with current therapy.  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  I do believe that possibly some mild weakness from the S1 distribution could be contributing to increasing the discomfort.  Patient's tibial plateau fracture does not have any intra-articular hardware.  We discussed different signs and symptoms and the risk again with this.  Patient was in agreement and wanted to have the injection.  Has done well with these over the course of the last 6 years.  Patient will continue to try to be active.  Is scheduled to have an epidural in the back in the near future.  Patient knows if worsening pain to seek medical attention immediately.  Patient will follow-up with me again in 6 to 8 weeks      Update 02/21/2023 Kristina Ayala is a 78 y.o. female coming in with complaint of Lknee and LBP. Patient states has fallen twice in last 30 days. L wrist pain from fall.       Past Medical History:  Diagnosis Date   Arthritis    Bursitis    left elbow   Clostridium difficile infection    Diabetes mellitus without complication (HCC)    Frequent falls    HLD (hyperlipidemia)    Hypertension    Infectious colitis    Past Surgical History:  Procedure Laterality Date   CATARACT EXTRACTION Bilateral     COLONOSCOPY     KNEE SURGERY  2014   ORIF PROXIMAL TIBIAL PLATEAU FRACTURE Left 02/19/2012   knee   POLYPECTOMY     TONSILLECTOMY     Social History   Socioeconomic History   Marital status: Divorced    Spouse name: Not on file   Number of children: 2   Years of education: Not on file   Highest education level: Some college, no degree  Occupational History   Occupation: RETIRED  Tobacco Use   Smoking status: Former    Current packs/day: 0.00    Average packs/day: 0.3 packs/day for 50.0 years (12.5 ttl pk-yrs)    Types: Cigarettes    Start date: 08/27/1964    Quit date: 08/28/2014    Years since quitting: 8.4   Smokeless tobacco: Never   Tobacco comments:    1 pack every 3 days  Vaping Use   Vaping status: Never Used  Substance and Sexual Activity   Alcohol use: No    Alcohol/week: 0.0 standard drinks of alcohol   Drug use: No   Sexual activity: Not Currently  Other Topics Concern   Not on file  Social History Narrative   Lives alone    Right handed   Caffeine: 3x weekly         Social Drivers of Health   Financial Resource Strain: Low Risk  (08/26/2022)  Overall Financial Resource Strain (CARDIA)    Difficulty of Paying Living Expenses: Not hard at all  Food Insecurity: No Food Insecurity (08/26/2022)   Hunger Vital Sign    Worried About Running Out of Food in the Last Year: Never true    Ran Out of Food in the Last Year: Never true  Transportation Needs: No Transportation Needs (08/26/2022)   PRAPARE - Administrator, Civil Service (Medical): No    Lack of Transportation (Non-Medical): No  Physical Activity: Inactive (08/26/2022)   Exercise Vital Sign    Days of Exercise per Week: 0 days    Minutes of Exercise per Session: 0 min  Stress: No Stress Concern Present (08/26/2022)   [employer] of Occupational Health - Occupational Stress Questionnaire    Feeling of Stress : Only a little  Social Connections: Moderately Integrated (08/26/2022)   Social  Connection and Isolation Panel [NHANES]    Frequency of Communication with Friends and Family: More than three times a week    Frequency of Social Gatherings with Friends and Family: Three times a week    Attends Religious Services: More than 4 times per year    Active Member of Clubs or Organizations: Yes    Attends Banker Meetings: More than 4 times per year    Marital Status: Divorced   Allergies  Allergen Reactions   Doxycycline Diarrhea   Minocycline Diarrhea   Wellbutrin [Bupropion] Swelling    Per pt her tongue was swollen and sore/symptoms stopped once the medication was discontinued.    Lactose Intolerance (Gi) Diarrhea   Penicillins Rash    Has patient had a PCN reaction causing immediate rash, facial/tongue/throat swelling, SOB or lightheadedness with hypotension: no Has patient had a PCN reaction causing severe rash involving mucus membranes or skin necrosis: no Has patient had a PCN reaction that required hospitalization: unknown Has patient had a PCN reaction occurring within the last 10 years: no If all of the above answers are "NO", then may proceed with Cephalosporin use.    Z-Pak [Azithromycin] Other (See Comments)    diarrhea   Family History  Problem Relation Age of Onset   Diverticulosis Mother    Ovarian cancer Mother    Pancreatic cancer Father    Lung cancer Father    Colon polyps Sister    Heart failure Daughter    Diabetes Son    Colon cancer Neg Hx    Breast cancer Neg Hx    Esophageal cancer Neg Hx    Stomach cancer Neg Hx     Current Outpatient Medications (Endocrine & Metabolic):    denosumab (PROLIA) 60 MG/ML SOSY injection, INJECT 60MG  INTO THE SKIN AS DIRECTED  Current Facility-Administered Medications (Endocrine & Metabolic):    [START ON 06/01/2023] denosumab (PROLIA) injection 60 mg  Current Outpatient Medications (Cardiovascular):    amLODipine (NORVASC) 10 MG tablet,    atorvastatin (LIPITOR) 40 MG tablet, TAKE 1  TABLET(40 MG) BY MOUTH DAILY. FOLLOW-UP APPOINTMENT WITH LABS ARE DUE   hydrochlorothiazide (HYDRODIURIL) 25 MG tablet, TAKE 1 TABLET(25 MG) BY MOUTH EVERY MORNING   lisinopril (ZESTRIL) 40 MG tablet, TAKE 1 TABLET(40 MG) BY MOUTH DAILY   Current Outpatient Medications (Respiratory):    fluticasone (FLONASE) 50 MCG/ACT nasal spray, Place 2 sprays into both nostrils daily.     Current Outpatient Medications (Hematological):    Cyanocobalamin (VITAMIN B-12) 2500 MCG SUBL, Place 2,500 mcg under the tongue daily.   Current Outpatient Medications (Other):  diclofenac Sodium (VOLTAREN) 1 % GEL, Apply 2 g topically 4 (four) times daily. To affected joint.   ALPRAZolam (XANAX) 0.5 MG tablet, TAKE 1 TABLET(0.5 MG) BY MOUTH EVERY DAY   cholecalciferol (VITAMIN D) 25 MCG (1000 UNIT) tablet, Take 2,000 Units by mouth daily.   gabapentin (NEURONTIN) 100 MG capsule, Take 2 capsules (200 mg total) by mouth at bedtime as needed.   MAGNESIUM PO, Take by mouth.   potassium chloride (KLOR-CON) 10 MEQ tablet, Take 1 tablet (10 mEq total) by mouth daily.   TURMERIC PO, Take 1 tablet by mouth daily.    Reviewed prior external information including notes and imaging from  primary care provider As well as notes that were available from care everywhere and other healthcare systems.  Past medical history, social, surgical and family history all reviewed in electronic medical record.  No pertanent information unless stated regarding to the chief complaint.   Review of Systems:  No headache, visual changes, nausea, vomiting, diarrhea, constipation, dizziness, abdominal pain, skin rash, fevers, chills, night sweats, weight loss, swollen lymph nodes, body aches, joint swelling, chest pain, shortness of breath, mood changes. POSITIVE muscle aches  Objective  Blood pressure 132/84, pulse 77, height 5\' 3"  (1.6 m), weight 160 lb (72.6 kg), SpO2 97%.   General: No apparent distress alert and oriented x3 mood  and affect normal, dressed appropriately.  HEENT: Pupils equal, extraocular movements intact  Respiratory: Patient's speak in full sentences and does not appear short of breath  Cardiovascular: No lower extremity edema, non tender, no erythema  Back exam loss lordosis noted.  Severe tenderness to palpation over the greater trochanteric area.  Tightness with Pearlean Brownie right greater than left.  Does have a positive straight leg test but noted as well.  Knee exam shows arthritic changes but nothing severe as we had previously.   After verbal consent patient was prepped with alcohol swab and with a 21-gauge 2 inch needle injected into the right greater trochanteric area with 2 cc of 0.5% Marcaine and 1 cc of Kenalog 40 mg/mL.  No blood loss.  Band-Aid placed.  Postinjection instructions given   Impression and Recommendations:    The above documentation has been reviewed and is accurate and complete Judi Saa, DO

## 2023-02-21 ENCOUNTER — Encounter: Payer: Self-pay | Admitting: Family Medicine

## 2023-02-21 ENCOUNTER — Ambulatory Visit (INDEPENDENT_AMBULATORY_CARE_PROVIDER_SITE_OTHER): Payer: 59 | Admitting: Family Medicine

## 2023-02-21 VITALS — BP 132/84 | HR 77 | Ht 63.0 in | Wt 160.0 lb

## 2023-02-21 DIAGNOSIS — M7062 Trochanteric bursitis, left hip: Secondary | ICD-10-CM | POA: Diagnosis not present

## 2023-02-21 DIAGNOSIS — M545 Low back pain, unspecified: Secondary | ICD-10-CM

## 2023-02-21 DIAGNOSIS — M7061 Trochanteric bursitis, right hip: Secondary | ICD-10-CM

## 2023-02-21 DIAGNOSIS — M5416 Radiculopathy, lumbar region: Secondary | ICD-10-CM

## 2023-02-21 MED ORDER — DICLOFENAC SODIUM 1 % EX GEL: 2.0000 g | Freq: Four times a day (QID) | CUTANEOUS | 11 refills | Status: DC

## 2023-02-21 NOTE — Patient Instructions (Signed)
See you again in 6 weeks Use 4 prong cane [facility] [phone]

## 2023-02-21 NOTE — Assessment & Plan Note (Signed)
Right-sided injected today.  Tolerated the procedure well, discussed icing regimen and home exercises, discussed activities to do increase activity as next.  Discussed with repetitive use.  Believe that lumbar radiculopathy is likely contributing as well.  Follow-up again in 6 weeks.

## 2023-02-21 NOTE — Assessment & Plan Note (Signed)
Known degenerative disc disease with nerve impingement.  Has responded well to injections previously.  Discussed SI joint home exercises, discussed avoiding certain activities.  Increase activity slowly.  Follow-up again in 6 to 8 weeks do think an epidural would be beneficial again for this individual.  Hopefully it will make a difference.

## 2023-02-22 ENCOUNTER — Encounter: Payer: Self-pay | Admitting: Family Medicine

## 2023-02-22 ENCOUNTER — Other Ambulatory Visit: Payer: Self-pay | Admitting: Internal Medicine

## 2023-02-22 MED ORDER — GABAPENTIN 100 MG PO CAPS: 200.0000 mg | ORAL_CAPSULE | Freq: Every evening | ORAL | Status: DC | PRN

## 2023-02-22 NOTE — Telephone Encounter (Signed)
Copied from CRM [id number]. Topic: Clinical - Medication Refill >> Feb 22, 2023 12:46 PM Lennart Pall wrote: Most Recent Primary Care Visit:  Provider: BURNS, Bobette Mo  Department: [facility]  Visit Type: PHYSICAL  Date: 01/13/2023  Medication: gabapentin (NEURONTIN) 100 MG capsule  Has the patient contacted their pharmacy? Yes (Agent: If no, request that the patient contact the pharmacy for the refill. If patient does not wish to contact the pharmacy document the reason why and proceed with request.) (Agent: If yes, when and what did the pharmacy advise?)  Is this the correct pharmacy for this prescription? Yes If no, delete pharmacy and type the correct one.  This is the patient's preferred pharmacy:  [id number] [place], [place] - [phone] AT [facility] OF [facility] & [address] [place] [address] Phone: [phone] Fax: [phone]   Has the prescription been filled recently? Yes  Is the patient out of the medication? Yes  Has the patient been seen for an appointment in the last year OR does the patient have an upcoming appointment? Yes  Can we respond through MyChart? Yes  Agent: Please be advised that Rx refills may take up to 3 business days. We ask that you follow-up with your pharmacy.

## 2023-02-23 ENCOUNTER — Ambulatory Visit
Admission: RE | Admit: 2023-02-23 | Discharge: 2023-02-23 | Disposition: A | Discharge status: Home / Self Care | Payer: 59 | Source: Ambulatory Visit | Attending: Family Medicine | Admitting: Family Medicine

## 2023-02-23 DIAGNOSIS — M5417 Radiculopathy, lumbosacral region: Secondary | ICD-10-CM | POA: Diagnosis not present

## 2023-02-23 DIAGNOSIS — M545 Low back pain, unspecified: Secondary | ICD-10-CM

## 2023-02-23 MED ORDER — IOPAMIDOL (ISOVUE-M 200) INJECTION 41%
1.0000 mL | Freq: Once | INTRAMUSCULAR | Status: AC
  Administered 2023-02-23: 1 mL via EPIDURAL

## 2023-02-23 MED ORDER — METHYLPREDNISOLONE ACETATE 40 MG/ML INJ SUSP (RADIOLOG
80.0000 mg | Freq: Once | INTRAMUSCULAR | Status: AC
  Administered 2023-02-23: 80 mg via EPIDURAL

## 2023-02-24 ENCOUNTER — Other Ambulatory Visit: Payer: Self-pay

## 2023-02-24 ENCOUNTER — Telehealth: Payer: Self-pay | Admitting: Family Medicine

## 2023-02-24 MED ORDER — GABAPENTIN 100 MG PO CAPS: 200.0000 mg | ORAL_CAPSULE | Freq: Every evening | ORAL | 0 refills | Status: DC | PRN

## 2023-02-24 MED ORDER — DICLOFENAC SODIUM 1 % EX GEL: 2.0000 g | Freq: Four times a day (QID) | CUTANEOUS | 11 refills | Status: DC

## 2023-02-24 NOTE — Telephone Encounter (Signed)
Patient called asking if Dr Katrinka Blazing could refill her Gabapentin (it looks like Dr Lawerance Bach filled it on 1/28 but says "No Print") and also resend the diclofenac sodium (she states they did not received it).

## 2023-03-01 ENCOUNTER — Other Ambulatory Visit: Payer: Self-pay

## 2023-03-01 ENCOUNTER — Other Ambulatory Visit: Payer: Self-pay | Admitting: Family Medicine

## 2023-03-01 MED ORDER — DICLOFENAC SODIUM 1 % EX GEL: 2.0000 g | Freq: Four times a day (QID) | CUTANEOUS | 11 refills | Status: DC

## 2023-03-01 MED ORDER — GABAPENTIN 100 MG PO CAPS: 200.0000 mg | ORAL_CAPSULE | Freq: Every evening | ORAL | Status: DC | PRN

## 2023-03-02 ENCOUNTER — Other Ambulatory Visit: Payer: Self-pay | Admitting: Internal Medicine

## 2023-03-02 ENCOUNTER — Other Ambulatory Visit: Payer: Self-pay

## 2023-03-02 MED ORDER — DICLOFENAC SODIUM 1 % EX GEL: 2.0000 g | Freq: Four times a day (QID) | CUTANEOUS | 11 refills | Status: DC

## 2023-03-02 MED ORDER — GABAPENTIN 100 MG PO CAPS: 200.0000 mg | ORAL_CAPSULE | Freq: Every evening | ORAL | 0 refills | Status: DC | PRN

## 2023-03-02 NOTE — Telephone Encounter (Signed)
 Copied from CRM [id number]. Topic: Clinical - Medication Refill >> Mar 02, 2023  3:51 PM Kinnie H wrote: Most Recent Primary Care Visit:  Provider: BURNS, GLADE PARAS  Department: [facility]  Visit Type: PHYSICAL  Date: 01/13/2023  Medication: gabapentin  (NEURONTIN ) 100 MG capsule diclofenac  Sodium (VOLTAREN ) 1 % GEL    Has the patient contacted their pharmacy? Yes (Agent: If no, request that the patient contact the pharmacy for the refill. If patient does not wish to contact the pharmacy document the reason why and proceed with request.) (Agent: If yes, when and what did the pharmacy advise?)  Is this the correct pharmacy for this prescription? Yes If no, delete pharmacy and type the correct one.  This is the patient's preferred pharmacy:  [facility] #[id number] [place], [place] - [phone] AT [facility] OF [facility] & [address] [place] [address] Phone: [phone] Fax: [phone]   Has the prescription been filled recently? No  Is the patient out of the medication? Yes  Has the patient been seen for an appointment in the last year OR does the patient have an upcoming appointment? Yes  Can we respond through MyChart? No  Agent: Please be advised that Rx refills may take up to 3 business days. We ask that you follow-up with your pharmacy.

## 2023-03-03 ENCOUNTER — Encounter: Payer: Self-pay | Admitting: Internal Medicine

## 2023-03-03 NOTE — Progress Notes (Signed)
 Subjective:    Patient ID: Kristina Ayala, female    DOB: 21-Dec-1945, 78 y.o.   MRN: [id number]     HPI Kristina Ayala is here for follow up of her chronic medical problems.  She fell twice - new years eve and one other time.  She injured her wrist - sprained the left wrist.  It is not getting better.  It is mildly swollen and hurts when she uses it.  She has been wrapping it, but states that does not work very well.  She was concerned that it is not getting better.  She also needed refills for a couple of medications  Medications and allergies reviewed with patient and updated if appropriate.  Current Outpatient Medications on File Prior to Visit  Medication Sig Dispense Refill   ALPRAZolam  (XANAX ) 0.5 MG tablet TAKE 1 TABLET(0.5 MG) BY MOUTH EVERY DAY 30 tablet 5   amLODipine  (NORVASC ) 10 MG tablet      atorvastatin  (LIPITOR) 40 MG tablet TAKE 1 TABLET(40 MG) BY MOUTH DAILY. FOLLOW-UP APPOINTMENT WITH LABS ARE DUE 90 tablet 1   cholecalciferol (VITAMIN D ) 25 MCG (1000 UNIT) tablet Take 2,000 Units by mouth daily.     Cyanocobalamin  (VITAMIN B-12) 2500 MCG SUBL Place 2,500 mcg under the tongue daily.     denosumab  (PROLIA ) 60 MG/ML SOSY injection INJECT 60MG  INTO THE SKIN AS DIRECTED 1 mL 0   fluticasone  (FLONASE ) 50 MCG/ACT nasal spray Place 2 sprays into both nostrils daily. 16 g 6   hydrochlorothiazide  (HYDRODIURIL ) 25 MG tablet TAKE 1 TABLET(25 MG) BY MOUTH EVERY MORNING 90 tablet 3   lisinopril  (ZESTRIL ) 40 MG tablet TAKE 1 TABLET(40 MG) BY MOUTH DAILY 90 tablet 2   MAGNESIUM  PO Take by mouth.     potassium chloride  (KLOR-CON ) 10 MEQ tablet Take 1 tablet (10 mEq total) by mouth daily. 30 tablet 3   TURMERIC PO Take 1 tablet by mouth daily.     Current Facility-Administered Medications on File Prior to Visit  Medication Dose Route Frequency Provider Last Rate Last Admin   [START ON 06/01/2023] denosumab  (PROLIA ) injection 60 mg  60 mg Subcutaneous Once Geofm Glade PARAS, MD          Review of Systems  Constitutional:  Negative for fever.  Musculoskeletal:  Positive for arthralgias, back pain and joint swelling.  Neurological:  Negative for weakness (Left hand) and numbness (Left hand or arm).  Psychiatric/Behavioral:  The patient is nervous/anxious (Increased stress at home.).        Objective:   Vitals:   03/04/23 0947 03/04/23 1032  BP: (!) 140/80 124/80  Pulse: 75   Temp: 98.2 F (36.8 C)   SpO2: 97%    BP Readings from Last 3 Encounters:  03/04/23 124/80  02/23/23 (!) 152/82  02/21/23 132/84   Wt Readings from Last 3 Encounters:  03/04/23 159 lb (72.1 kg)  02/21/23 160 lb (72.6 kg)  01/13/23 163 lb (73.9 kg)   Body mass index is 28.17 kg/m.    Physical Exam Constitutional:      General: She is not in acute distress.    Appearance: Normal appearance. She is not ill-appearing.  HENT:     Head: Normocephalic and atraumatic.  Musculoskeletal:     Comments: Left wrist with slight swelling.  Tenderness with palpation and movement.  No pain without movement.  Neurovascularly intact.  Normal range of motion.  No deformity  Skin:    General: Skin is warm  and dry.     Findings: No erythema or rash.  Neurological:     Mental Status: She is alert.  Psychiatric:        Mood and Affect: Mood normal.        Lab Results  Component Value Date   WBC 6.5 01/13/2023   HGB 13.8 01/13/2023   HCT 42.1 01/13/2023   PLT 323.0 01/13/2023   GLUCOSE 96 01/13/2023   CHOL 165 01/13/2023   TRIG 96.0 01/13/2023   HDL 53.30 01/13/2023   LDLDIRECT 213.1 05/22/2012   LDLCALC 92 01/13/2023   ALT 24 01/13/2023   AST 19 01/13/2023   NA 141 01/13/2023   K 4.0 01/13/2023   CL 106 01/13/2023   CREATININE 0.73 01/13/2023   BUN 11 01/13/2023   CO2 28 01/13/2023   TSH 0.65 01/13/2023   HGBA1C 6.2 01/13/2023   MICROALBUR <0.7 01/13/2023     Assessment & Plan:    See Problem List for Assessment and Plan of chronic medical problems.

## 2023-03-04 ENCOUNTER — Ambulatory Visit: Payer: 59 | Admitting: Internal Medicine

## 2023-03-04 ENCOUNTER — Other Ambulatory Visit (HOSPITAL_COMMUNITY): Payer: Self-pay

## 2023-03-04 VITALS — BP 124/80 | HR 75 | Temp 98.2°F | Ht 63.0 in | Wt 159.0 lb

## 2023-03-04 DIAGNOSIS — S63502A Unspecified sprain of left wrist, initial encounter: Secondary | ICD-10-CM | POA: Diagnosis not present

## 2023-03-04 MED ORDER — GABAPENTIN 100 MG PO CAPS: 200.0000 mg | ORAL_CAPSULE | Freq: Every evening | ORAL | 0 refills | Status: DC | PRN

## 2023-03-04 MED ORDER — DICLOFENAC SODIUM 1 % EX GEL: 2.0000 g | Freq: Four times a day (QID) | CUTANEOUS | 11 refills | Status: AC

## 2023-03-04 MED ORDER — MELOXICAM 7.5 MG PO TABS: 7.5000 mg | ORAL_TABLET | Freq: Every day | ORAL | 0 refills | Status: DC | PRN

## 2023-03-04 MED ORDER — GABAPENTIN 100 MG PO CAPS
200.0000 mg | ORAL_CAPSULE | Freq: Every evening | ORAL | 0 refills | Status: DC | PRN
  Filled 2023-03-04: qty 180, 90d supply, fill #0

## 2023-03-04 MED ORDER — MELOXICAM 7.5 MG PO TABS
7.5000 mg | ORAL_TABLET | Freq: Every day | ORAL | 0 refills | Status: DC | PRN
  Filled 2023-03-04: qty 30, 30d supply, fill #0

## 2023-03-04 MED ORDER — DICLOFENAC SODIUM 1 % EX GEL: 2.0000 g | Freq: Four times a day (QID) | CUTANEOUS | 11 refills | Status: DC

## 2023-03-04 NOTE — Assessment & Plan Note (Addendum)
 Subacute Wrist is slightly swollen and tender when she uses it only Diagnosed with left sprain wrist sports Advised her to buy the wrist brace from the pharmacy. Start meloxicam  7.5 mg daily as needed with food-advised this is short-term only and will help with some of the pain in the wrist Can use diclofenac  gel on wrist Advised wearing the wrist brace 24/7 if possible-take it off intermittently as needed Discussed that this will take several weeks to heal

## 2023-03-04 NOTE — Patient Instructions (Addendum)
    Buy a wrist brace at the pharmacy -    Medications changes include :   meloxicam  7.5 mg daily with food as needed    Return for follow up as scheduled.

## 2023-03-22 ENCOUNTER — Ambulatory Visit (INDEPENDENT_AMBULATORY_CARE_PROVIDER_SITE_OTHER): Payer: 59 | Admitting: Neurology

## 2023-03-22 ENCOUNTER — Encounter: Payer: Self-pay | Admitting: Neurology

## 2023-03-22 VITALS — BP 139/78 | HR 90 | Ht 64.0 in | Wt 157.0 lb

## 2023-03-22 DIAGNOSIS — G8929 Other chronic pain: Secondary | ICD-10-CM

## 2023-03-22 DIAGNOSIS — M5416 Radiculopathy, lumbar region: Secondary | ICD-10-CM

## 2023-03-22 DIAGNOSIS — M1732 Unilateral post-traumatic osteoarthritis, left knee: Secondary | ICD-10-CM | POA: Diagnosis not present

## 2023-03-22 DIAGNOSIS — M5440 Lumbago with sciatica, unspecified side: Secondary | ICD-10-CM

## 2023-03-22 DIAGNOSIS — M48061 Spinal stenosis, lumbar region without neurogenic claudication: Secondary | ICD-10-CM

## 2023-03-22 NOTE — Progress Notes (Addendum)
 [facility]  Provider:  Dr Brinson Tozzi Referring Provider: Pincus Sanes, MD Primary Care Physician:  Pincus Sanes, MD  Chief Complaint  Patient presents with   New Patient (Initial Visit)    Pt in room 1 alone, New patient Internal referral for gait abnormality. Pt reports 3 falls this year, no head injury, walks with cane. Pt said it takes her a second to get up from chairs.  Pt admits to being under a lot of stress, daughter has moved in with 8 granddaughter.     HPI:  Kristina Ayala is a 78 y.o. female and seen here upon a new referral for general Neurology  from Dr. Lawerance Bach for a Consultation/ Evaluation of balance disorder, falls.  She has had 3 falls this year alone, and she has been fallen for years. Even when she was first seeing Dr Pearlean Brownie she had reported gait impairments, had a knee fracture in 2014, and she was still smoking. She was referred for a one time sleep consults in 2018.  The patient is actively enrolled in PT, Chronic pain treatment , sports medicine.  Radiculopathy, osteoarthritis of hips and knees.  She had normal thyroid tests, metabolic panels, and CBC, no deficiency of Vit B  12 or vit D, iron. ( Ferritin nl) .  She is referred by her PCP.   Back pain and stiffness affect her in-home ambulation.  She wobbles to the bathroom and uses a cane. She drifts when walking to the right.   When first rising from bed there is a delay to get control over balance and  gain initially stability . I suggested that she may experience some orthostatic component, but she endorsed only that her back is out> >   This patient reports onset of ambulatory impairment  over a period of 8 years.    Review of Systems: Out of a complete 14 system review, the patient complains of only the following symptoms, and all other reviewed systems are negative.   Social History   Socioeconomic History   Marital status: Divorced    Spouse name: Not on file   Number of  children: 2   Years of education: Not on file   Highest education level: Some college, no degree  Occupational History   Occupation: RETIRED  Tobacco Use   Smoking status: Former    Current packs/day: 0.00    Average packs/day: 0.3 packs/day for 50.0 years (12.5 ttl pk-yrs)    Types: Cigarettes    Start date: 08/27/1964    Quit date: 08/28/2014    Years since quitting: 8.5   Smokeless tobacco: Never   Tobacco comments:    1 pack every 3 days  Vaping Use   Vaping status: Never Used  Substance and Sexual Activity   Alcohol use: No    Alcohol/week: 0.0 standard drinks of alcohol   Drug use: No   Sexual activity: Not Currently  Other Topics Concern   Not on file  Social History Narrative   Lives alone    Right handed   Caffeine: 3x weekly         Social Drivers of Health   Financial Resource Strain: Low Risk  (08/26/2022)   Overall Financial Resource Strain (CARDIA)    Difficulty of Paying Living Expenses: Not hard at all  Food Insecurity: No Food Insecurity (08/26/2022)   Hunger Vital Sign    Worried About Running Out of Food in the Last Year: Never true    Ran  Out of Food in the Last Year: Never true  Transportation Needs: No Transportation Needs (08/26/2022)   PRAPARE - Administrator, Civil Service (Medical): No    Lack of Transportation (Non-Medical): No  Physical Activity: Inactive (08/26/2022)   Exercise Vital Sign    Days of Exercise per Week: 0 days    Minutes of Exercise per Session: 0 min  Stress: No Stress Concern Present (08/26/2022)   [employer] of Occupational Health - Occupational Stress Questionnaire    Feeling of Stress : Only a little  Social Connections: Moderately Integrated (08/26/2022)   Social Connection and Isolation Panel [NHANES]    Frequency of Communication with Friends and Family: More than three times a week    Frequency of Social Gatherings with Friends and Family: Three times a week    Attends Religious Services: More than 4 times  per year    Active Member of Clubs or Organizations: Yes    Attends Banker Meetings: More than 4 times per year    Marital Status: Divorced  Intimate Partner Violence: Not At Risk (08/26/2022)   Humiliation, Afraid, Rape, and Kick questionnaire    Fear of Current or Ex-Partner: No    Emotionally Abused: No    Physically Abused: No    Sexually Abused: No    Family History  Problem Relation Age of Onset   Diverticulosis Mother    Ovarian cancer Mother    Pancreatic cancer Father    Lung cancer Father    Colon polyps Sister    Heart failure Daughter    Diabetes Son    Colon cancer Neg Hx    Breast cancer Neg Hx    Esophageal cancer Neg Hx    Stomach cancer Neg Hx     Past Medical History:  Diagnosis Date   Arthritis    Bursitis    left elbow   Clostridium difficile infection    Diabetes mellitus without complication (HCC)    Frequent falls    HLD (hyperlipidemia)    Hypertension    Infectious colitis     Past Surgical History:  Procedure Laterality Date   CATARACT EXTRACTION Bilateral    COLONOSCOPY     KNEE SURGERY  2014   ORIF PROXIMAL TIBIAL PLATEAU FRACTURE Left 02/19/2012   knee   POLYPECTOMY     TONSILLECTOMY      Current Outpatient Medications  Medication Sig Dispense Refill   ALPRAZolam (XANAX) 0.5 MG tablet TAKE 1 TABLET(0.5 MG) BY MOUTH EVERY DAY 30 tablet 5   amLODipine (NORVASC) 10 MG tablet      atorvastatin (LIPITOR) 40 MG tablet TAKE 1 TABLET(40 MG) BY MOUTH DAILY. FOLLOW-UP APPOINTMENT WITH LABS ARE DUE 90 tablet 1   cholecalciferol (VITAMIN D) 25 MCG (1000 UNIT) tablet Take 2,000 Units by mouth daily.     Cyanocobalamin (VITAMIN B-12) 2500 MCG SUBL Place 2,500 mcg under the tongue daily.     denosumab (PROLIA) 60 MG/ML SOSY injection INJECT 60MG  INTO THE SKIN AS DIRECTED 1 mL 0   diclofenac Sodium (VOLTAREN) 1 % GEL Apply 2 g topically 4 (four) times daily. To affected joint. 100 g 11   diclofenac Sodium (VOLTAREN) 1 % GEL Apply 2  g topically 4 (four) times daily to affected joint. 100 g 11   fluticasone (FLONASE) 50 MCG/ACT nasal spray Place 2 sprays into both nostrils daily. 16 g 6   gabapentin (NEURONTIN) 100 MG capsule Take 2 capsules (200 mg total) by  mouth at bedtime as needed. 180 capsule 0   gabapentin (NEURONTIN) 100 MG capsule Take 2 capsules (200 mg total) by mouth at bedtime as needed. 180 capsule 0   hydrochlorothiazide (HYDRODIURIL) 25 MG tablet TAKE 1 TABLET(25 MG) BY MOUTH EVERY MORNING 90 tablet 3   lisinopril (ZESTRIL) 40 MG tablet TAKE 1 TABLET(40 MG) BY MOUTH DAILY 90 tablet 2   MAGNESIUM PO Take by mouth.     meloxicam (MOBIC) 7.5 MG tablet Take 1 tablet (7.5 mg total) by mouth daily as needed for pain. Take with food 30 tablet 0   meloxicam (MOBIC) 7.5 MG tablet Take 1 tablet (7.5 mg total) by mouth daily as needed for pain take with food 30 tablet 0   potassium chloride (KLOR-CON) 10 MEQ tablet Take 1 tablet (10 mEq total) by mouth daily. 30 tablet 3   TURMERIC PO Take 1 tablet by mouth daily.     Current Facility-Administered Medications  Medication Dose Route Frequency Provider Last Rate Last Admin   [START ON 06/01/2023] denosumab (PROLIA) injection 60 mg  60 mg Subcutaneous Once Pincus Sanes, MD        Allergies as of 03/22/2023 - Review Complete 03/22/2023  Allergen Reaction Noted   Doxycycline Diarrhea 08/03/2011   Minocycline Diarrhea 08/03/2011   Wellbutrin [bupropion] Swelling 07/10/2014   Lactose intolerance (gi) Diarrhea 01/13/2023   Penicillins Rash 12/18/2010   Z-pak [azithromycin] Other (See Comments) 02/12/2020    Vitals: BP 139/78 (BP Location: Left Arm, Patient Position: Sitting, Cuff Size: Normal)   Pulse 90   Ht 5\' 4"  (1.626 m)   Wt 157 lb (71.2 kg)   BMI 26.95 kg/m  Last Weight:  Wt Readings from Last 1 Encounters:  03/22/23 157 lb (71.2 kg)   Last Height:   Ht Readings from Last 1 Encounters:  03/22/23 5\' 4"  (1.626 m)    Physical exam:  General: The  patient is awake, alert and appears not in acute distress.  The patient is well groomed. Head: Normocephalic, atraumatic.  Neck is supple. Cardiovascular:  Regular rate and palpable peripheral pulse:  Respiratory: clear to auscultation.   Skin:  Without evidence of edema, or rash Trunk:    Neurologic exam : The patient is awake and alert, oriented to place and time.  Memory subjective  described as intact.  There is a normal attention span & concentration ability.  Speech is fluent without  dysarthria, dysphonia or aphasia.  Mood and affect are appropriate.  Cranial nerves: Pupils are equal and briskly reactive to light. Funduscopic exam without  evidence of pallor or edema. Extraocular movements  in vertical and horizontal planes intact and without nystagmus. Visual fields by finger perimetry are intact. Upward gaze drifts slowly down and is rapidly corrected upwards again.  Bilateral ptosis.  Hearing to finger rub intact.  Facial sensation intact to fine touch.  Facial motor strength is symmetric and tongue and uvula move midline.  Motor exam:   upper extremities have normal tone, bulk and  good ROM, normal grip strength bilaterally.  There is core-strength weakness,  rising from a seated position was very much impaired, hip flexion is bilaterally very weak, the foot lift is impaired, the left foot points forward the right foot pointed outwards , she drifts to the right when walking, not walking stooped, turning with 4 steps, and drifting again towards the right wall as we walk.   Foot pointing outwards.   Sensory:  Fine touch and vibration were tested .  Proprioception was tested in the upper extremities only and was  normal. She felt vibration well in all toes and both ankles.  Coordination: Rapid alternating movements in the fingers/hands were normal.  Finger-to-nose maneuver was tested and showed no evidence of ataxia, dysmetria or tremor.  Deep tendon reflexes: in the  upper and  lower extremities are symmetric and  brisk without Clonus. Babinski maneuver response is  downgoing.   Assessment: Total time for face to face interview and examination, for review of  images and laboratory testing, neurophysiology testing and pre-existing records, including out-of -network , was 45 minutes. Assessment is as follows here:  1)   core strength loss, weakness in hip flexion , in knee flexion and some left knee stiffness.    Gait drifting to the right.   Sometimes falls to the left   Feet are not numb and have good pulse.  2)  back pain , treated with injections- can be the cause of hip flexion weakness , she has been  in treatment  for this , had images of her spinal cord.  Largely unchanged diffuse lumbar disc and facet degeneration. 2. 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 3. Moderate left lateral recess and left neural foraminal stenosis at L5-S1. 4. Mild spinal stenosis and mild-to-moderate right neural foraminal stenosis at L3-4.    Plan:  Treatment plan and additional workup planned after today includes:   1)   There is no specific pill.  This is core strength weakness and hip flexor weakness. This is more likely a result of spinal stenosis. Sudden falls without loss of awareness.  She now states to me ; Always to the left.    2)  I have no capacity for EMG or NCV here, and would ask the rehab department to take this test on.  I am sorry, but  neuromuscular disorders are not in my scope of practice.    3) Patient  needs safety precaution and daily exercises while attended to.  If this can be arranged for with PT through sports medicine or phys med , I  would defer to them.    Prn RV   Melvyn Novas, MD

## 2023-03-22 NOTE — Patient Instructions (Signed)
 Physical Activity Log Staying physically active is important for your health. Your health care provider may recommend a physical activity plan that includes: Endurance (aerobic) exercise. Examples include running, swimming, or biking. Strength exercise. Examples include using weights or resistance bands. Balance and flexibility exercise. Examples include using a stability ball, doing yoga or tai chi, or stretching. Contact your health care provider before you start any exercise routine. Ask your health care provider: What activities are safe for you. How many hours or minutes you should exercise each week. What can be done to stay safe when exercising. Endurance exercise My goal is to do _______________________ hours and _______________________ minutes of endurance exercise each week. Date: _______________________ Activity: _______________________ Total hours or minutes: _______________________ Date: _______________________ Activity: _______________________ Total hours or minutes: _______________________ Date: _______________________ Activity: _______________________ Total hours or minutes: _______________________ Date: _______________________ Activity: _______________________ Total hours or minutes: _______________________ Date: _______________________ Activity: _______________________ Total hours or minutes: _______________________ Date: _______________________ Activity: _______________________ Total hours or minutes: _______________________ Date: _______________________ Activity: _______________________ Total hours or minutes: _______________________ Strength exercise My goal is to do _______________________ hours and _______________________ minutes of strength exercise each week. Date: _______________________ Activity: _______________________ Total hours or minutes: _______________________ Date: _______________________ Activity: _______________________ Total hours or  minutes: _______________________ Date: _______________________ Activity: _______________________ Total hours or minutes: _______________________ Date: _______________________ Activity: _______________________ Total hours or minutes: _______________________ Date: _______________________ Activity: _______________________ Total hours or minutes: _______________________ Date: _______________________ Activity: _______________________ Total hours or minutes: _______________________ Date: _______________________ Activity: _______________________ Total hours or minutes: _______________________ Balance and flexibility exercise My goal is to do _______________________ hours and _______________________ minutes of balance and flexibility exercise each week. Date: _______________________ Activity: _______________________ Total hours or minutes: _______________________ Date: _______________________ Activity: _______________________ Total hours or minutes: _______________________ Date: _______________________ Activity: _______________________ Total hours or minutes: _______________________ Date: _______________________ Activity: _______________________ Total hours or minutes: _______________________ Date: _______________________ Activity: _______________________ Total hours or minutes: _______________________ Date: _______________________ Activity: _______________________ Total hours or minutes: _______________________ Date: _______________________ Activity: _______________________ Total hours or minutes: _______________________ This information is not intended to replace advice given to you by your health care provider. Make sure you discuss any questions you have with your health care provider. Document Revised: 05/09/2020 Document Reviewed: 05/09/2020 Elsevier Patient Education  2024 [employer].

## 2023-03-22 NOTE — Addendum Note (Signed)
 Addended by: Melvyn Novas on: 03/22/2023 03:13 PM   Modules accepted: Orders

## 2023-03-23 ENCOUNTER — Telehealth: Payer: Self-pay | Admitting: Neurology

## 2023-03-23 NOTE — Telephone Encounter (Signed)
 Referral for physical medicine rehabilitation sent through [facility] to [facility]. Phone: [phone]

## 2023-04-04 NOTE — Progress Notes (Unsigned)
 [name] [address] [place] [address] Phone: [phone] Subjective:   INadine Counts, am serving as a scribe for Dr. Antoine Primas.  I'm seeing this patient by the request  of:  Pincus Sanes, MD  CC: No pain but recent falls  [id number]  02/21/2023 Known degenerative disc disease with nerve impingement.  Has responded well to injections previously.  Discussed SI joint home exercises, discussed avoiding certain activities.  Increase activity slowly.  Follow-up again in 6 to 8 weeks do think an epidural would be beneficial again for this individual.  Hopefully it will make a difference.     Right-sided injected today.  Tolerated the procedure well, discussed icing regimen and home exercises, discussed activities to do increase activity as next.  Discussed with repetitive use.  Believe that lumbar radiculopathy is likely contributing as well.  Follow-up again in 6 weeks.      Update 04/05/2023 Kristina Ayala is a 78 y.o. female coming in with complaint of L hip and lumbar spine pain. Patient states has had 4 falls. Has seen neuro and no problems. Going to get her ears checked.  Has received an epidural at the end of January which did help the back but still has these episodes when patient does fall.      Past Medical History:  Diagnosis Date   Arthritis    Bursitis    left elbow   Clostridium difficile infection    Diabetes mellitus without complication (HCC)    Frequent falls    HLD (hyperlipidemia)    Hypertension    Infectious colitis    Past Surgical History:  Procedure Laterality Date   CATARACT EXTRACTION Bilateral    COLONOSCOPY     KNEE SURGERY  2014   ORIF PROXIMAL TIBIAL PLATEAU FRACTURE Left 02/19/2012   knee   POLYPECTOMY     TONSILLECTOMY     Social History   Socioeconomic History   Marital status: Divorced    Spouse name: Not on file   Number of children: 2   Years of education: Not on file    Highest education level: Some college, no degree  Occupational History   Occupation: RETIRED  Tobacco Use   Smoking status: Former    Current packs/day: 0.00    Average packs/day: 0.3 packs/day for 50.0 years (12.5 ttl pk-yrs)    Types: Cigarettes    Start date: 08/27/1964    Quit date: 08/28/2014    Years since quitting: 8.6   Smokeless tobacco: Never   Tobacco comments:    1 pack every 3 days  Vaping Use   Vaping status: Never Used  Substance and Sexual Activity   Alcohol use: No    Alcohol/week: 0.0 standard drinks of alcohol   Drug use: No   Sexual activity: Not Currently  Other Topics Concern   Not on file  Social History Narrative   Lives alone    Right handed   Caffeine: 3x weekly         Social Drivers of Health   Financial Resource Strain: Low Risk  (08/26/2022)   Overall Financial Resource Strain (CARDIA)    Difficulty of Paying Living Expenses: Not hard at all  Food Insecurity: No Food Insecurity (08/26/2022)   Hunger Vital Sign    Worried About Running Out of Food in the Last Year: Never true    Ran Out of Food in the Last Year: Never true  Transportation Needs: No Transportation  Needs (08/26/2022)   PRAPARE - Administrator, Civil Service (Medical): No    Lack of Transportation (Non-Medical): No  Physical Activity: Inactive (08/26/2022)   Exercise Vital Sign    Days of Exercise per Week: 0 days    Minutes of Exercise per Session: 0 min  Stress: No Stress Concern Present (08/26/2022)   [employer] of Occupational Health - Occupational Stress Questionnaire    Feeling of Stress : Only a little  Social Connections: Moderately Integrated (08/26/2022)   Social Connection and Isolation Panel [NHANES]    Frequency of Communication with Friends and Family: More than three times a week    Frequency of Social Gatherings with Friends and Family: Three times a week    Attends Religious Services: More than 4 times per year    Active Member of Clubs or  Organizations: Yes    Attends Banker Meetings: More than 4 times per year    Marital Status: Divorced   Allergies  Allergen Reactions   Doxycycline Diarrhea   Minocycline Diarrhea   Wellbutrin [Bupropion] Swelling    Per pt her tongue was swollen and sore/symptoms stopped once the medication was discontinued.    Lactose Intolerance (Gi) Diarrhea   Penicillins Rash    Has patient had a PCN reaction causing immediate rash, facial/tongue/throat swelling, SOB or lightheadedness with hypotension: no Has patient had a PCN reaction causing severe rash involving mucus membranes or skin necrosis: no Has patient had a PCN reaction that required hospitalization: unknown Has patient had a PCN reaction occurring within the last 10 years: no If all of the above answers are "NO", then may proceed with Cephalosporin use.    Z-Pak [Azithromycin] Other (See Comments)    diarrhea   Family History  Problem Relation Age of Onset   Diverticulosis Mother    Ovarian cancer Mother    Pancreatic cancer Father    Lung cancer Father    Colon polyps Sister    Heart failure Daughter    Diabetes Son    Colon cancer Neg Hx    Breast cancer Neg Hx    Esophageal cancer Neg Hx    Stomach cancer Neg Hx     Current Outpatient Medications (Endocrine & Metabolic):    denosumab (PROLIA) 60 MG/ML SOSY injection, INJECT 60MG  INTO THE SKIN AS DIRECTED  Current Facility-Administered Medications (Endocrine & Metabolic):    [START ON 06/01/2023] denosumab (PROLIA) injection 60 mg  Current Outpatient Medications (Cardiovascular):    amLODipine (NORVASC) 10 MG tablet,    atorvastatin (LIPITOR) 40 MG tablet, TAKE 1 TABLET(40 MG) BY MOUTH DAILY. FOLLOW-UP APPOINTMENT WITH LABS ARE DUE   hydrochlorothiazide (HYDRODIURIL) 25 MG tablet, TAKE 1 TABLET(25 MG) BY MOUTH EVERY MORNING   lisinopril (ZESTRIL) 40 MG tablet, TAKE 1 TABLET(40 MG) BY MOUTH DAILY   Current Outpatient Medications (Respiratory):     fluticasone (FLONASE) 50 MCG/ACT nasal spray, Place 2 sprays into both nostrils daily.   Current Outpatient Medications (Analgesics):    meloxicam (MOBIC) 7.5 MG tablet, Take 1 tablet (7.5 mg total) by mouth daily as needed for pain. Take with food   meloxicam (MOBIC) 7.5 MG tablet, Take 1 tablet (7.5 mg total) by mouth daily as needed for pain take with food   Current Outpatient Medications (Hematological):    Cyanocobalamin (VITAMIN B-12) 2500 MCG SUBL, Place 2,500 mcg under the tongue daily.   Current Outpatient Medications (Other):    ALPRAZolam (XANAX) 0.5 MG tablet, TAKE 1 TABLET(0.5  MG) BY MOUTH EVERY DAY   cholecalciferol (VITAMIN D) 25 MCG (1000 UNIT) tablet, Take 2,000 Units by mouth daily.   diclofenac Sodium (VOLTAREN) 1 % GEL, Apply 2 g topically 4 (four) times daily. To affected joint.   diclofenac Sodium (VOLTAREN) 1 % GEL, Apply 2 g topically 4 (four) times daily to affected joint.   gabapentin (NEURONTIN) 100 MG capsule, Take 2 capsules (200 mg total) by mouth at bedtime as needed.   gabapentin (NEURONTIN) 100 MG capsule, Take 2 capsules (200 mg total) by mouth at bedtime as needed.   MAGNESIUM PO, Take by mouth.   potassium chloride (KLOR-CON) 10 MEQ tablet, Take 1 tablet (10 mEq total) by mouth daily.   TURMERIC PO, Take 1 tablet by mouth daily.    Reviewed prior external information including notes and imaging from  primary care provider As well as notes that were available from care everywhere and other healthcare systems.  Past medical history, social, surgical and family history all reviewed in electronic medical record.  No pertanent information unless stated regarding to the chief complaint.   Review of Systems:  No headache, visual changes, nausea, vomiting, diarrhea, constipation, dizziness, abdominal pain, skin rash, fevers, chills, night sweats, weight loss, swollen lymph nodes, joint swelling, chest pain, shortness of breath, mood changes. POSITIVE muscle  aches, body aches  Objective  Blood pressure 122/76, pulse 92, height 5\' 4"  (1.626 m), weight 158 lb (71.7 kg), SpO2 96%.   General: No apparent distress alert and oriented x3 mood and affect normal, dressed appropriately.  HEENT: Pupils equal, extraocular movements intact  Respiratory: Patient's speak in full sentences and does not appear short of breath  Cardiovascular: No lower extremity edema, non tender, no erythema  Low back does have some mild loss lordosis noted.  Patient has 4-5 strength of the lower extremities but does seem to be symmetric.  Tightness with straight leg test but no true radicular symptoms.  Has 5 degrees of extension of the back noted.  Tightness with FABER test.    Impression and Recommendations:     The above documentation has been reviewed and is accurate and complete Judi Saa, DO

## 2023-04-05 ENCOUNTER — Ambulatory Visit (INDEPENDENT_AMBULATORY_CARE_PROVIDER_SITE_OTHER): Payer: 59 | Admitting: Family Medicine

## 2023-04-05 DIAGNOSIS — M5416 Radiculopathy, lumbar region: Secondary | ICD-10-CM

## 2023-04-05 NOTE — Assessment & Plan Note (Signed)
 Known lumbar radiculopathy.  We discussed with patient along with what she is describing is these drop symptoms I do not know if this is more from the back.  Patient does feel like she is somewhat anxious more recently.  Discussed with patient about icing regimen and home exercises, discussed which activities to do and which ones to avoid.  Discussed increasing activity slowly.  Discussed icing regimen.  Increase activity slowly.  Follow-up again in 4 to 6 weeks otherwise.

## 2023-04-05 NOTE — Patient Instructions (Addendum)
 Consider Cymbalta 20mg  Think it's a good idea to take Also think a 4 prong cane when going out would be beneficial See you again in 3-4 months

## 2023-04-07 ENCOUNTER — Other Ambulatory Visit: Payer: Self-pay

## 2023-04-07 DIAGNOSIS — M810 Age-related osteoporosis without current pathological fracture: Secondary | ICD-10-CM

## 2023-04-07 MED ORDER — DENOSUMAB 60 MG/ML ~~LOC~~ SOSY
60.0000 mg | PREFILLED_SYRINGE | Freq: Once | SUBCUTANEOUS | Status: AC
Start: 2023-06-01 — End: 2023-06-06
  Administered 2023-06-06: 60 mg via SUBCUTANEOUS

## 2023-04-11 ENCOUNTER — Telehealth: Payer: Self-pay | Admitting: Family Medicine

## 2023-04-11 NOTE — Telephone Encounter (Signed)
 Patient called requesting a refill on: DULoxetine (CYMBALTA) 20 MG capsule   She also asked if another Epidural could be ordered for her?  Please advise.

## 2023-04-12 ENCOUNTER — Other Ambulatory Visit: Payer: Self-pay

## 2023-04-12 DIAGNOSIS — M5416 Radiculopathy, lumbar region: Secondary | ICD-10-CM

## 2023-04-12 MED ORDER — DULOXETINE HCL 20 MG PO CPEP: 20.0000 mg | ORAL_CAPSULE | Freq: Every day | ORAL | 0 refills | Status: DC

## 2023-04-28 NOTE — Progress Notes (Unsigned)
    Aleen Sells D.[facility] [address] [place] [address] Phone: [phone]   Assessment and Plan:     There are no diagnoses linked to this encounter.  ***   Pertinent previous records reviewed include ***    Follow Up: ***     Subjective:   I,  , am serving as a Neurosurgeon for Doctor Richardean Sale  Chief Complaint: knee pain   HPI:   04/29/2023 Patient is a 78 year old female with knee pain. Patient states   Relevant Historical Information: ***  Additional pertinent review of systems negative.   Current Outpatient Medications:    ALPRAZolam (XANAX) 0.5 MG tablet, TAKE 1 TABLET(0.5 MG) BY MOUTH EVERY DAY, Disp: 30 tablet, Rfl: 5   amLODipine (NORVASC) 10 MG tablet, , Disp: , Rfl:    atorvastatin (LIPITOR) 40 MG tablet, TAKE 1 TABLET(40 MG) BY MOUTH DAILY. FOLLOW-UP APPOINTMENT WITH LABS ARE DUE, Disp: 90 tablet, Rfl: 1   cholecalciferol (VITAMIN D) 25 MCG (1000 UNIT) tablet, Take 2,000 Units by mouth daily., Disp: , Rfl:    Cyanocobalamin (VITAMIN B-12) 2500 MCG SUBL, Place 2,500 mcg under the tongue daily., Disp: , Rfl:    diclofenac Sodium (VOLTAREN) 1 % GEL, Apply 2 g topically 4 (four) times daily. To affected joint., Disp: 100 g, Rfl: 11   diclofenac Sodium (VOLTAREN) 1 % GEL, Apply 2 g topically 4 (four) times daily to affected joint., Disp: 100 g, Rfl: 11   DULoxetine (CYMBALTA) 20 MG capsule, Take 1 capsule (20 mg total) by mouth daily., Disp: 30 capsule, Rfl: 0   fluticasone (FLONASE) 50 MCG/ACT nasal spray, Place 2 sprays into both nostrils daily., Disp: 16 g, Rfl: 6   gabapentin (NEURONTIN) 100 MG capsule, Take 2 capsules (200 mg total) by mouth at bedtime as needed., Disp: 180 capsule, Rfl: 0   gabapentin (NEURONTIN) 100 MG capsule, Take 2 capsules (200 mg total) by mouth at bedtime as needed., Disp: 180 capsule, Rfl: 0   hydrochlorothiazide (HYDRODIURIL) 25 MG tablet, TAKE 1 TABLET(25 MG) BY MOUTH EVERY  MORNING, Disp: 90 tablet, Rfl: 3   lisinopril (ZESTRIL) 40 MG tablet, TAKE 1 TABLET(40 MG) BY MOUTH DAILY, Disp: 90 tablet, Rfl: 2   MAGNESIUM PO, Take by mouth., Disp: , Rfl:    meloxicam (MOBIC) 7.5 MG tablet, Take 1 tablet (7.5 mg total) by mouth daily as needed for pain. Take with food, Disp: 30 tablet, Rfl: 0   meloxicam (MOBIC) 7.5 MG tablet, Take 1 tablet (7.5 mg total) by mouth daily as needed for pain take with food, Disp: 30 tablet, Rfl: 0   potassium chloride (KLOR-CON) 10 MEQ tablet, Take 1 tablet (10 mEq total) by mouth daily., Disp: 30 tablet, Rfl: 3   TURMERIC PO, Take 1 tablet by mouth daily., Disp: , Rfl:   Current Facility-Administered Medications:    [START ON 06/01/2023] denosumab (PROLIA) injection 60 mg, 60 mg, Subcutaneous, Once, Burns, Bobette Mo, MD   Objective:     There were no vitals filed for this visit.    There is no height or weight on file to calculate BMI.    Physical Exam:    ***   Electronically signed by:  Aleen Sells D.[facility] 7:34 AM 04/28/23

## 2023-04-29 ENCOUNTER — Ambulatory Visit: Admitting: Sports Medicine

## 2023-04-29 VITALS — BP 102/80 | HR 83 | Ht 64.0 in | Wt 155.0 lb

## 2023-04-29 DIAGNOSIS — G8929 Other chronic pain: Secondary | ICD-10-CM

## 2023-04-29 DIAGNOSIS — M1732 Unilateral post-traumatic osteoarthritis, left knee: Secondary | ICD-10-CM | POA: Diagnosis not present

## 2023-04-29 DIAGNOSIS — M25562 Pain in left knee: Secondary | ICD-10-CM

## 2023-05-04 ENCOUNTER — Other Ambulatory Visit: Payer: Self-pay | Admitting: Internal Medicine

## 2023-05-04 DIAGNOSIS — Z1231 Encounter for screening mammogram for malignant neoplasm of breast: Secondary | ICD-10-CM

## 2023-05-04 NOTE — Discharge Instructions (Signed)

## 2023-05-05 ENCOUNTER — Ambulatory Visit
Admission: RE | Admit: 2023-05-05 | Discharge: 2023-05-05 | Disposition: A | Discharge status: Home / Self Care | Source: Ambulatory Visit | Attending: Family Medicine | Admitting: Family Medicine

## 2023-05-05 DIAGNOSIS — M4727 Other spondylosis with radiculopathy, lumbosacral region: Secondary | ICD-10-CM | POA: Diagnosis not present

## 2023-05-05 DIAGNOSIS — M5416 Radiculopathy, lumbar region: Secondary | ICD-10-CM

## 2023-05-05 MED ORDER — METHYLPREDNISOLONE ACETATE 40 MG/ML INJ SUSP (RADIOLOG
80.0000 mg | Freq: Once | INTRAMUSCULAR | Status: AC
  Administered 2023-05-05: 80 mg via EPIDURAL

## 2023-05-05 MED ORDER — IOPAMIDOL (ISOVUE-M 200) INJECTION 41%
1.0000 mL | Freq: Once | INTRAMUSCULAR | Status: AC
  Administered 2023-05-05: 1 mL via EPIDURAL

## 2023-05-16 ENCOUNTER — Other Ambulatory Visit (HOSPITAL_COMMUNITY): Payer: Self-pay

## 2023-05-16 ENCOUNTER — Telehealth: Payer: Self-pay

## 2023-05-16 NOTE — Telephone Encounter (Signed)
 Pt ready for scheduling for PROLIA  on or after : 06/01/23  Option# 1: Buy/Bill (Office supplied medication)  Out-of-pocket cost due at time of clinic visit: $357  Number of injection/visits approved: 2  Primary: [employer] MEDICARE Prolia  co-insurance: 20% Admin fee co-insurance: 20%  Secondary: --- Prolia  co-insurance:  Admin fee co-insurance:   Medical Benefit Details: Date Benefits were checked: 05/16/23 Deductible: $257 Met of $257 Required/ Coinsurance: 20%/ Admin Fee: 20%  Prior Auth: APPROVED PA# [id number] Expiration Date: 06/01/23-05/31/24  # of doses approved: 2 ----------------------------------------------------------------------- Option# 2- Med Obtained from pharmacy:  Pharmacy benefit: Copay $0 (Paid to pharmacy) Admin Fee: 20% (Pay at clinic)  Prior Auth: N/A PA# Expiration Date:   # of doses approved:   If patient wants fill through the pharmacy benefit please send prescription to: [facility], and include estimated need by date in rx notes. Pharmacy will ship medication directly to the office.  Patient NOT eligible for Prolia  Copay Card. Copay Card can make patient's cost as little as $25. Link to apply: [email or web address]://[email or web address]  ** This summary of benefits is an estimation of the patient's out-of-pocket cost. Exact cost may very based on individual plan coverage.

## 2023-05-16 NOTE — Telephone Encounter (Signed)
 Prolia VOB initiated via [email or web address]  Next Prolia inj DUE: 06/01/23

## 2023-05-16 NOTE — Telephone Encounter (Signed)
 Kristina Ayala

## 2023-05-17 ENCOUNTER — Other Ambulatory Visit: Payer: Self-pay | Admitting: Internal Medicine

## 2023-05-17 ENCOUNTER — Other Ambulatory Visit (HOSPITAL_COMMUNITY): Payer: Self-pay

## 2023-05-17 ENCOUNTER — Other Ambulatory Visit: Payer: Self-pay

## 2023-05-17 MED ORDER — GABAPENTIN 100 MG PO CAPS: 200.0000 mg | ORAL_CAPSULE | Freq: Every evening | ORAL | 0 refills | Status: DC | PRN

## 2023-05-17 MED ORDER — MELOXICAM 7.5 MG PO TABS: 7.5000 mg | ORAL_TABLET | Freq: Every day | ORAL | 0 refills | Status: DC | PRN

## 2023-05-17 MED ORDER — GABAPENTIN 100 MG PO CAPS
200.0000 mg | ORAL_CAPSULE | Freq: Every evening | ORAL | 0 refills | Status: DC | PRN
Start: 2023-05-17 — End: 2023-05-17

## 2023-05-17 MED ORDER — ALPRAZOLAM 0.5 MG PO TABS: ORAL_TABLET | ORAL | 5 refills | Status: DC

## 2023-05-17 MED FILL — Gabapentin Cap 100 MG: ORAL | 90 days supply | Qty: 180 | Fill #0 | Status: AC

## 2023-05-17 MED FILL — Meloxicam Tab 7.5 MG: ORAL | 30 days supply | Qty: 30 | Fill #0 | Status: AC

## 2023-05-17 NOTE — Telephone Encounter (Signed)
 Copied from CRM [id number]. Topic: Clinical - Medication Refill >> May 17, 2023  8:12 AM Varney Gentleman wrote: Most Recent Primary Care Visit:  Provider: BURNS, Beckey Bourgeois  Department: [facility]  Visit Type: OFFICE VISIT  Date: 03/04/2023  Medication: gabapentin  (NEURONTIN ) 100 MG capsule meloxicam  (MOBIC ) 7.5 MG tablet  ALPRAZolam  (XANAX ) 0.5 MG tablet  Has the patient contacted their pharmacy? Yes, pharmacy states contact provider (Agent: If no, request that the patient contact the pharmacy for the refill. If patient does not wish to contact the pharmacy document the reason why and proceed with request.) (Agent: If yes, when and what did the pharmacy advise?)  Is this the correct pharmacy for this prescription? Yes If no, delete pharmacy and type the correct one.  This is the patient's preferred pharmacy:  [id number] [place], [place] - [phone] AT [facility] OF [facility] & [address] [place] [address] Phone: [phone] Fax: [phone]   Has the prescription been filled recently? No  Is the patient out of the medication? Yes  Has the patient been seen for an appointment in the last year OR does the patient have an upcoming appointment? Yes  Can we respond through MyChart? Yes  Agent: Please be advised that Rx refills may take up to 3 business days. We ask that you follow-up with your pharmacy.

## 2023-05-18 ENCOUNTER — Other Ambulatory Visit: Payer: Self-pay | Admitting: Family Medicine

## 2023-05-20 NOTE — Telephone Encounter (Signed)
 A representative from [employer] called and said there was an error in the verification and would like the previous message to be disregarding. They provided a new case number because they are re-verifying benefits.  New case number [id number]

## 2023-05-23 NOTE — Telephone Encounter (Signed)
 The only difference in the new verification is the deductible. Per new verification, there is no deductible. Out of pocket estimate remains the same.

## 2023-05-26 ENCOUNTER — Ambulatory Visit
Admission: RE | Admit: 2023-05-26 | Discharge: 2023-05-26 | Disposition: A | Discharge status: Home / Self Care | Source: Ambulatory Visit | Attending: Internal Medicine | Admitting: Internal Medicine

## 2023-05-26 DIAGNOSIS — Z1231 Encounter for screening mammogram for malignant neoplasm of breast: Secondary | ICD-10-CM

## 2023-05-30 ENCOUNTER — Other Ambulatory Visit: Payer: Self-pay | Admitting: Internal Medicine

## 2023-05-30 DIAGNOSIS — M810 Age-related osteoporosis without current pathological fracture: Secondary | ICD-10-CM

## 2023-05-31 NOTE — Telephone Encounter (Signed)
 If we send it to Pharmacy, she would pay the pharmacy and they would ship it to us , at that time we would schedule her Prolia . As for the Admin fee would be billed based on the insurance after the fact, I do not know the exact amount for that usually $25-$40 and then she would pay 20% of that.  If she wants to keep the 5/8 appointment she will need to be clinic supplied but if she wants it from pharmacy we will need to cancel appointment and reschedule for once the medication arrives.

## 2023-06-01 ENCOUNTER — Other Ambulatory Visit (HOSPITAL_COMMUNITY): Payer: Self-pay

## 2023-06-01 NOTE — Telephone Encounter (Signed)
 Spoke with patient today.

## 2023-06-01 NOTE — Telephone Encounter (Signed)
 Copied from CRM [id number]. Topic: Clinical - Prescription Issue >> Jun 01, 2023 11:29 AM Martinique E wrote: Reason for CRM: Patient called in regarding her denosumab  (PROLIA ) injection 60 mg, stated that the pharmacy does not have this injection for her to pick up when she is supposed to be heading into the office tomorrow 5/8 to have a nurse administer it. Callback number for patient is [phone].  ---  Please assist

## 2023-06-01 NOTE — Telephone Encounter (Addendum)
 When I tried to do a test claim, it said Prolia  was filled through insurance on today. If pharmacy did not have in stock, you can try another pharmacy or one of [facility] pharmacies for patient to pick up. [facility] will have to cancel this fill.    Administration fee is approximately $25

## 2023-06-02 ENCOUNTER — Ambulatory Visit

## 2023-06-03 ENCOUNTER — Other Ambulatory Visit: Payer: Self-pay | Admitting: Internal Medicine

## 2023-06-03 DIAGNOSIS — I10 Essential (primary) hypertension: Secondary | ICD-10-CM

## 2023-06-06 ENCOUNTER — Ambulatory Visit (INDEPENDENT_AMBULATORY_CARE_PROVIDER_SITE_OTHER)

## 2023-06-06 DIAGNOSIS — M810 Age-related osteoporosis without current pathological fracture: Secondary | ICD-10-CM

## 2023-06-06 MED ORDER — DENOSUMAB 60 MG/ML ~~LOC~~ SOSY
60.0000 mg | PREFILLED_SYRINGE | Freq: Once | SUBCUTANEOUS | Status: DC
Start: 2023-12-07 — End: 2023-10-06

## 2023-06-06 NOTE — Progress Notes (Signed)
 Prolia  injection given w/o complication.   Next prolia  ordered for 6 months

## 2023-06-09 NOTE — Telephone Encounter (Signed)
 Patient administered injection 06/06/23

## 2023-06-13 ENCOUNTER — Telehealth: Payer: Self-pay | Admitting: Family Medicine

## 2023-06-13 ENCOUNTER — Other Ambulatory Visit: Payer: Self-pay

## 2023-06-13 DIAGNOSIS — M5416 Radiculopathy, lumbar region: Secondary | ICD-10-CM

## 2023-06-13 NOTE — Telephone Encounter (Signed)
 Patient asked if another epidural could be ordered for her.  Please advise.

## 2023-06-15 NOTE — Progress Notes (Signed)
 Ben Mitsuko Luera D.[facility] [address] [place] [address] Phone: [phone]   Assessment and Plan:     1. Post-traumatic osteoarthritis of left knee 2. Chronic pain of left knee  -Chronic with exacerbation, subsequent visit - Recurrent flare of left knee pain consistent with flare of mild osteoarthritis with history of knee surgery in 2014 - Patient typically was receiving at least 3 months relief from CSI, however patient has only received 5 to 6 weeks of relief from previous CSI performed on 04/29/23.  Based on temporary relief with CSI, recommend transitioning to Zilretta  injection which should provide longer relief and relative decrease in hyperglycemia in patient with past medical history of DM type II.  We will order prior authorization for Zilretta  and outpatient follow-up for injection once approved - Continue Tylenol  for day-to-day pain relief - Continue meloxicam  7.5 mg daily as needed for breakthrough pain  Pertinent previous records reviewed include none  Follow Up: We will contact patient once Zilretta  is approved to follow-up for injection only procedure visit.  Patient may follow-up with Dr. Felipe Horton for ongoing treatment for low back pain with radicular symptoms   Subjective:   I, Moenique Parris, am serving as a Neurosurgeon for Doctor Ulysees Gander   Chief Complaint: left knee pain    HPI:    04/29/2023 Patient is a 78 year old female with knee pain. Patient states left knee pain. Hx of fx 2014 . She is asking for CSI. Pain flared last Thursday    06/16/2023 Patient states left knee pain ready for CSI   Relevant Historical Information: Hypertension, DM type II  Additional pertinent review of systems negative.   Current Outpatient Medications:    ALPRAZolam  (XANAX ) 0.5 MG tablet, TAKE 1 TABLET(0.5 MG) BY MOUTH EVERY DAY, Disp: 30 tablet, Rfl: 5   amLODipine  (NORVASC ) 10 MG tablet, , Disp: , Rfl:    atorvastatin  (LIPITOR) 40  MG tablet, TAKE 1 TABLET(40 MG) BY MOUTH DAILY. FOLLOW-UP APPOINTMENT WITH LABS ARE DUE, Disp: 90 tablet, Rfl: 1   cholecalciferol (VITAMIN D ) 25 MCG (1000 UNIT) tablet, Take 2,000 Units by mouth daily., Disp: , Rfl:    Cyanocobalamin  (VITAMIN B-12) 2500 MCG SUBL, Place 2,500 mcg under the tongue daily., Disp: , Rfl:    denosumab  (PROLIA ) 60 MG/ML SOSY injection, INJECT 60 UNITS UNDER THE SKIN AS DIRECTED, Disp: 1 mL, Rfl: 0   diclofenac  Sodium (VOLTAREN ) 1 % GEL, Apply 2 g topically 4 (four) times daily. To affected joint., Disp: 100 g, Rfl: 11   DULoxetine  (CYMBALTA ) 20 MG capsule, TAKE 1 CAPSULE(20 MG) BY MOUTH DAILY, Disp: 30 capsule, Rfl: 0   fluticasone  (FLONASE ) 50 MCG/ACT nasal spray, Place 2 sprays into both nostrils daily., Disp: 16 g, Rfl: 6   gabapentin  (NEURONTIN ) 100 MG capsule, Take 2 capsules (200 mg total) by mouth at bedtime as needed., Disp: 180 capsule, Rfl: 0   hydrochlorothiazide  (HYDRODIURIL ) 25 MG tablet, TAKE 1 TABLET(25 MG) BY MOUTH EVERY MORNING, Disp: 90 tablet, Rfl: 3   lisinopril  (ZESTRIL ) 40 MG tablet, TAKE 1 TABLET(40 MG) BY MOUTH DAILY, Disp: 90 tablet, Rfl: 2   MAGNESIUM  PO, Take by mouth., Disp: , Rfl:    meloxicam  (MOBIC ) 7.5 MG tablet, Take 1 tablet (7.5 mg total) by mouth daily as needed for pain take with food, Disp: 30 tablet, Rfl: 0   potassium chloride  (KLOR-CON ) 10 MEQ tablet, Take 1 tablet (10 mEq total) by mouth daily., Disp: 30 tablet, Rfl:  3   TURMERIC PO, Take 1 tablet by mouth daily., Disp: , Rfl:    diclofenac  Sodium (VOLTAREN ) 1 % GEL, Apply 2 g topically 4 (four) times daily to affected joint., Disp: 100 g, Rfl: 11   gabapentin  (NEURONTIN ) 100 MG capsule, Take 2 capsules (200 mg total) by mouth at bedtime as needed., Disp: 180 capsule, Rfl: 0   gabapentin  (NEURONTIN ) 100 MG capsule, Take 2 capsules (200 mg total) by mouth at bedtime as needed., Disp: 180 capsule, Rfl: 0   meloxicam  (MOBIC ) 7.5 MG tablet, Take 1 tablet (7.5 mg total) by mouth daily  as needed for pain take with food, Disp: 30 tablet, Rfl: 0   meloxicam  (MOBIC ) 7.5 MG tablet, Take 1 tablet (7.5 mg total) by mouth daily as needed for pain. Take with food, Disp: 30 tablet, Rfl: 0  Current Facility-Administered Medications:    [START ON 12/07/2023] denosumab  (PROLIA ) injection 60 mg, 60 mg, Subcutaneous, Once, Burns, Beckey Bourgeois, MD   Objective:     Vitals:   06/16/23 0842  BP: 132/82  Pulse: 83  SpO2: 100%  Weight: 155 lb (70.3 kg)  Height: 5\' 4"  (1.626 m)      Body mass index is 26.61 kg/m.    Physical Exam:     General:  awake, alert oriented, no acute distress nontoxic Skin: no suspicious lesions or rashes Neuro:sensation intact and strength 5/5 with no deficits, no atrophy, normal muscle tone Psych: No signs of anxiety, depression or other mood disorder   Left knee: Crepitus present No swelling No deformity Neg fluid wave, joint milking ROM Flex 110, Ext 0 TTP medial and lateral joint line NTTP over the quad tendon, medial fem condyle, lat fem condyle, patella, plica, patella tendon, tibial tuberostiy, fibular head, posterior fossa, pes anserine bursa, gerdy's tubercle,      Gait antalgic, favoring right leg, using 1 prong cane for support    Electronically signed by:  Marshall Skeeter D.[facility] 9:20 AM 06/16/23

## 2023-06-16 ENCOUNTER — Ambulatory Visit (INDEPENDENT_AMBULATORY_CARE_PROVIDER_SITE_OTHER): Admitting: Sports Medicine

## 2023-06-16 ENCOUNTER — Telehealth: Payer: Self-pay | Admitting: Sports Medicine

## 2023-06-16 ENCOUNTER — Telehealth: Payer: Self-pay

## 2023-06-16 VITALS — BP 132/82 | HR 83 | Ht 64.0 in | Wt 155.0 lb

## 2023-06-16 DIAGNOSIS — M1732 Unilateral post-traumatic osteoarthritis, left knee: Secondary | ICD-10-CM

## 2023-06-16 DIAGNOSIS — M25562 Pain in left knee: Secondary | ICD-10-CM

## 2023-06-16 DIAGNOSIS — G8929 Other chronic pain: Secondary | ICD-10-CM | POA: Diagnosis not present

## 2023-06-16 NOTE — Telephone Encounter (Signed)
 Closing encounter

## 2023-06-16 NOTE — Telephone Encounter (Signed)
 Zilretta  for left knee ran 06/16/2023. Case #: [id number]. Pending approval.

## 2023-06-16 NOTE — Telephone Encounter (Signed)
 Patient needs an office visit once medication has been stocked.   Zilretta  approved for left knee. No prior authorization, medical notes or referrals needed. Patient has a Fully Funded Dual Complete (Medicare/Medicaid) HMO/POS plan with an effective date of 01/26/2023. Plan follows Medicare guidelines. Patient responsibility for [phone] (Zilretta ), CPT code 60454 and specialist office visits will be 20% with the remaining covered at 80% by the payer at the contracted rate. Patient has a deductible of $257. Deductible has been met. Patient has an out of pocket maximum of $9350 and has accumulated $520.78. If out of pocket is met, coverage goes to 100%. Patient has Medicaid as secondary insurance. Member's responsibilities left over by Medicare can be billed to Medicaid (If eligible). Member will be covered at 100% of the allowable amount.  Case number: [id number] Exp: 12/17/2023

## 2023-06-16 NOTE — Patient Instructions (Addendum)
 Left knee zilretta   Tylenol  [phone] mg 2-3 times a day for pain relief  NSAIDs 1-2 times per week

## 2023-06-16 NOTE — Telephone Encounter (Signed)
 Approved. Documented in FYI.

## 2023-06-20 ENCOUNTER — Encounter: Payer: Self-pay | Admitting: Internal Medicine

## 2023-06-20 NOTE — Patient Instructions (Addendum)
      Blood work was ordered.       Medications changes include :   decrease the gabapentin  to 100 mg bedtime as needed.  Start taking meloxicam  daily with food for several days     Return in about 6 months (around 12/22/2023) for Physical Exam.

## 2023-06-20 NOTE — Progress Notes (Unsigned)
 Subjective:    Patient ID: Kristina Ayala, female    DOB: [birth year], 78 y.o.   MRN: [id number]     HPI Kristina Ayala is here for follow up of her chronic medical problems.  Had RSV injection - did not feel well for a few days - still does not feel well.  It causes fatigue, body aches, back pain and generalized weakness.  She fell recently  - fell on left knee and right knee -- left knee is swollen and hurts a lot.  Her back pain has gotten worse.  Her back pain is in the lower back, she has leg pain.  This pain is related to the RSV injection and the fall made it worse.  She thinks she fell because of the RSV injection-it made her weak and very tired and she just lost her balance.  Medications and allergies reviewed with patient and updated if appropriate.  Current Outpatient Medications on File Prior to Visit  Medication Sig Dispense Refill   albuterol  (VENTOLIN  HFA) 108 (90 Base) MCG/ACT inhaler SMARTSIG:1-2 Puff(s) By Mouth Every 6 Hours PRN     ALPRAZolam  (XANAX ) 0.5 MG tablet TAKE 1 TABLET(0.5 MG) BY MOUTH EVERY DAY 30 tablet 5   amLODipine  (NORVASC ) 10 MG tablet      atorvastatin  (LIPITOR) 40 MG tablet TAKE 1 TABLET(40 MG) BY MOUTH DAILY. FOLLOW-UP APPOINTMENT WITH LABS ARE DUE 90 tablet 1   cholecalciferol (VITAMIN D ) 25 MCG (1000 UNIT) tablet Take 2,000 Units by mouth daily.     Cyanocobalamin  (VITAMIN B-12) 2500 MCG SUBL Place 2,500 mcg under the tongue daily.     denosumab  (PROLIA ) 60 MG/ML SOSY injection INJECT 60 UNITS UNDER THE SKIN AS DIRECTED 1 mL 0   diclofenac  Sodium (VOLTAREN ) 1 % GEL Apply 2 g topically 4 (four) times daily. To affected joint. 100 g 11   fluticasone  (FLONASE ) 50 MCG/ACT nasal spray Place 2 sprays into both nostrils daily. 16 g 6   hydrochlorothiazide  (HYDRODIURIL ) 25 MG tablet TAKE 1 TABLET(25 MG) BY MOUTH EVERY MORNING 90 tablet 3   lisinopril  (ZESTRIL ) 40 MG tablet TAKE 1 TABLET(40 MG) BY MOUTH DAILY 90 tablet 2   MAGNESIUM  PO Take by mouth.      meloxicam  (MOBIC ) 7.5 MG tablet Take 1 tablet (7.5 mg total) by mouth daily as needed for pain take with food 30 tablet 0   potassium chloride  (KLOR-CON ) 10 MEQ tablet Take 1 tablet (10 mEq total) by mouth daily. 30 tablet 3   RETIN-A 0.05 % cream Apply topically at bedtime.     TURMERIC PO Take 1 tablet by mouth daily.     Current Facility-Administered Medications on File Prior to Visit  Medication Dose Route Frequency Provider Last Rate Last Admin   [START ON 12/07/2023] denosumab  (PROLIA ) injection 60 mg  60 mg Subcutaneous Once Colene Dauphin, MD         Review of Systems  Constitutional:  Negative for fever.  Respiratory:  Negative for cough, shortness of breath and wheezing.   Cardiovascular:  Positive for chest pain (occ - lasts about 10 minutes, when she has her bra on - taking off the bra helps). Negative for palpitations and leg swelling.  Gastrointestinal:        No gerd  Musculoskeletal:  Positive for arthralgias and back pain.  Neurological:  Positive for headaches (frontal). Negative for light-headedness.       Objective:   Vitals:   06/21/23 1339 06/21/23 1429  BP: (!) 150/90 134/82  Pulse: 95   Temp: 98.3 F (36.8 C)   SpO2: 96%    BP Readings from Last 3 Encounters:  06/21/23 134/82  06/16/23 132/82  05/05/23 (!) 158/83   Wt Readings from Last 3 Encounters:  06/21/23 158 lb (71.7 kg)  06/16/23 155 lb (70.3 kg)  04/29/23 155 lb (70.3 kg)   Body mass index is 27.12 kg/m.    Physical Exam Constitutional:      General: She is not in acute distress.    Appearance: Normal appearance.  HENT:     Head: Normocephalic and atraumatic.  Eyes:     Conjunctiva/sclera: Conjunctivae normal.  Cardiovascular:     Rate and Rhythm: Normal rate and regular rhythm.     Heart sounds: Normal heart sounds.  Pulmonary:     Effort: Pulmonary effort is normal. No respiratory distress.     Breath sounds: Normal breath sounds. No wheezing.  Musculoskeletal:         General: Swelling (Left knee) and tenderness (Left knee) present.     Cervical back: Neck supple.     Right lower leg: No edema.     Left lower leg: No edema.  Lymphadenopathy:     Cervical: No cervical adenopathy.  Skin:    General: Skin is warm and dry.     Findings: No rash.  Neurological:     Mental Status: She is alert. Mental status is at baseline.  Psychiatric:        Mood and Affect: Mood normal.        Behavior: Behavior normal.        Lab Results  Component Value Date   WBC 6.5 01/13/2023   HGB 13.8 01/13/2023   HCT 42.1 01/13/2023   PLT 323.0 01/13/2023   GLUCOSE 96 01/13/2023   CHOL 165 01/13/2023   TRIG 96.0 01/13/2023   HDL 53.30 01/13/2023   LDLDIRECT 213.1 05/22/2012   LDLCALC 92 01/13/2023   ALT 24 01/13/2023   AST 19 01/13/2023   NA 141 01/13/2023   K 4.0 01/13/2023   CL 106 01/13/2023   CREATININE 0.73 01/13/2023   BUN 11 01/13/2023   CO2 28 01/13/2023   TSH 0.65 01/13/2023   HGBA1C 6.2 01/13/2023   MICROALBUR <0.7 01/13/2023     Assessment & Plan:    See Problem List for Assessment and Plan of chronic medical problems.

## 2023-06-21 ENCOUNTER — Ambulatory Visit (INDEPENDENT_AMBULATORY_CARE_PROVIDER_SITE_OTHER): Admitting: Internal Medicine

## 2023-06-21 ENCOUNTER — Ambulatory Visit: Payer: Self-pay | Admitting: Internal Medicine

## 2023-06-21 VITALS — BP 134/82 | HR 95 | Temp 98.3°F | Ht 64.0 in | Wt 158.0 lb

## 2023-06-21 DIAGNOSIS — I10 Essential (primary) hypertension: Secondary | ICD-10-CM | POA: Diagnosis not present

## 2023-06-21 DIAGNOSIS — G2581 Restless legs syndrome: Secondary | ICD-10-CM | POA: Diagnosis not present

## 2023-06-21 DIAGNOSIS — M810 Age-related osteoporosis without current pathological fracture: Secondary | ICD-10-CM

## 2023-06-21 DIAGNOSIS — E1169 Type 2 diabetes mellitus with other specified complication: Secondary | ICD-10-CM | POA: Diagnosis not present

## 2023-06-21 DIAGNOSIS — M25462 Effusion, left knee: Secondary | ICD-10-CM | POA: Diagnosis not present

## 2023-06-21 DIAGNOSIS — E782 Mixed hyperlipidemia: Secondary | ICD-10-CM | POA: Diagnosis not present

## 2023-06-21 DIAGNOSIS — F419 Anxiety disorder, unspecified: Secondary | ICD-10-CM | POA: Diagnosis not present

## 2023-06-21 DIAGNOSIS — E876 Hypokalemia: Secondary | ICD-10-CM

## 2023-06-21 DIAGNOSIS — M545 Low back pain, unspecified: Secondary | ICD-10-CM | POA: Diagnosis not present

## 2023-06-21 DIAGNOSIS — M25562 Pain in left knee: Secondary | ICD-10-CM | POA: Insufficient documentation

## 2023-06-21 LAB — COMPREHENSIVE METABOLIC PANEL WITH GFR
ALT: 26 U/L (ref 0–35)
AST: 20 U/L (ref 0–37)
Albumin: 4.7 g/dL (ref 3.5–5.2)
Alkaline Phosphatase: 50 U/L (ref 39–117)
BUN: 16 mg/dL (ref 6–23)
CO2: 29 meq/L (ref 19–32)
Calcium: 9.9 mg/dL (ref 8.4–10.5)
Chloride: 102 meq/L (ref 96–112)
Creatinine, Ser: 0.64 mg/dL (ref 0.40–1.20)
GFR: 84.14 mL/min (ref >60.00)
Glucose, Bld: 86 mg/dL (ref 70–99)
Potassium: 3.4 meq/L — ABNORMAL LOW (ref 3.5–5.1)
Sodium: 141 meq/L (ref 135–145)
Total Bilirubin: 0.4 mg/dL (ref 0.2–1.2)
Total Protein: 7.6 g/dL (ref 6.0–8.3)

## 2023-06-21 LAB — LIPID PANEL
Cholesterol: 193 mg/dL (ref 0–200)
HDL: 54.1 mg/dL (ref >39.00)
LDL Cholesterol: 112 mg/dL — ABNORMAL HIGH (ref 0–99)
NonHDL: 138.77
Total CHOL/HDL Ratio: 4
Triglycerides: 132 mg/dL (ref 0.0–149.0)
VLDL: 26.4 mg/dL (ref 0.0–40.0)

## 2023-06-21 LAB — VITAMIN D 25 HYDROXY (VIT D DEFICIENCY, FRACTURES): VITD: 50.98 ng/mL (ref 30.00–100.00)

## 2023-06-21 LAB — HEMOGLOBIN A1C: Hgb A1c MFr Bld: 6.1 % (ref 4.6–6.5)

## 2023-06-21 MED ORDER — GABAPENTIN 100 MG PO CAPS: 100.0000 mg | ORAL_CAPSULE | Freq: Every evening | ORAL | Status: DC | PRN

## 2023-06-21 NOTE — Assessment & Plan Note (Signed)
 Chronic With hyperlipidemia  Lab Results  Component Value Date   HGBA1C 6.2 01/13/2023   Sugars well controlled Check A1c Continue lifestyle control Stressed regular exercise

## 2023-06-21 NOTE — Assessment & Plan Note (Signed)
Chronic Regular exercise and healthy diet encouraged Check lipid panel, CMP Continue atorvastatin 40 mg daily 

## 2023-06-21 NOTE — Assessment & Plan Note (Signed)
 Acute low back pain Started after getting RSV vaccine and worsened after a fall she had recently Also has some pain in her upper legs Start meloxicam  7.5 mg daily with food-take for several days and then stop Take gabapentin  100 mg at bedtime as needed-take for a few nights and then can stop

## 2023-06-21 NOTE — Assessment & Plan Note (Addendum)
 Chronic Blood pressure initially elevated-improved Cmp Continue amlodipine  10 mg at night, hydrochlorothiazide  25 mg, lisinopril  40 mg in the morning

## 2023-06-21 NOTE — Assessment & Plan Note (Addendum)
 Chronic Has intermittent at night It has gotten worse recently Not currently on any medication

## 2023-06-21 NOTE — Assessment & Plan Note (Signed)
 Chronic On Prolia -continue every 6 months Last Prolia  06/06/2023 DEXA up-to-date Stressed regular exercise Continue vitamin D  supplementation Advised calcium  supplementation and trying to eat a calcium  rich diet Check vitamin D  level

## 2023-06-21 NOTE — Assessment & Plan Note (Signed)
 Chronic controlled Continue alprazolam 0.5 mg nightly as needed-does not take often

## 2023-06-21 NOTE — Assessment & Plan Note (Signed)
Chronic cmp Continue potassium chloride 10 mEq daily

## 2023-06-21 NOTE — Assessment & Plan Note (Signed)
 Acute Fell onto her left knee recently and has swelling and pain Likely flare of OA Following with sports medicine Awaiting for approval of Zirletta injection for chronic knee pain Will follow-up with sports medicine for acute pain as well Start meloxicam  7.5 mg daily-take with food Continue icing

## 2023-06-23 NOTE — Telephone Encounter (Signed)
 Scheduled

## 2023-06-29 NOTE — Progress Notes (Unsigned)
    Kristina Ayala [facility] [address] [place] [address] Phone: [phone]   Assessment and Plan:     There are no diagnoses linked to this encounter.  ***   Pertinent previous records reviewed include ***    Follow Up: ***     Subjective:   I, Kristina Ayala, am serving as a Neurosurgeon for Doctor Ulysees Gander   Chief Complaint: left knee pain    HPI:    04/29/2023 Patient is a 78 year old female with knee pain. Patient states left knee pain. Hx of fx 2014 . She is asking for CSI. Pain flared last Thursday    06/16/2023 Patient states left knee pain ready for CSI  06/30/2023 Patient states   Relevant Historical Information: Hypertension, DM type II  Additional pertinent review of systems negative.   Current Outpatient Medications:    albuterol  (VENTOLIN  HFA) 108 (90 Base) MCG/ACT inhaler, SMARTSIG:1-2 Puff(s) By Mouth Every 6 Hours PRN, Disp: , Rfl:    ALPRAZolam  (XANAX ) 0.5 MG tablet, TAKE 1 TABLET(0.5 MG) BY MOUTH EVERY DAY, Disp: 30 tablet, Rfl: 5   amLODipine  (NORVASC ) 10 MG tablet, , Disp: , Rfl:    atorvastatin  (LIPITOR) 40 MG tablet, TAKE 1 TABLET(40 MG) BY MOUTH DAILY. FOLLOW-UP APPOINTMENT WITH LABS ARE DUE, Disp: 90 tablet, Rfl: 1   cholecalciferol (VITAMIN D ) 25 MCG (1000 UNIT) tablet, Take 2,000 Units by mouth daily., Disp: , Rfl:    Cyanocobalamin  (VITAMIN B-12) 2500 MCG SUBL, Place 2,500 mcg under the tongue daily., Disp: , Rfl:    denosumab  (PROLIA ) 60 MG/ML SOSY injection, INJECT 60 UNITS UNDER THE SKIN AS DIRECTED, Disp: 1 mL, Rfl: 0   diclofenac  Sodium (VOLTAREN ) 1 % GEL, Apply 2 g topically 4 (four) times daily. To affected joint., Disp: 100 g, Rfl: 11   fluticasone  (FLONASE ) 50 MCG/ACT nasal spray, Place 2 sprays into both nostrils daily., Disp: 16 g, Rfl: 6   gabapentin  (NEURONTIN ) 100 MG capsule, Take 1 capsule (100 mg total) by mouth at bedtime as needed., Disp: , Rfl:    hydrochlorothiazide   (HYDRODIURIL ) 25 MG tablet, TAKE 1 TABLET(25 MG) BY MOUTH EVERY MORNING, Disp: 90 tablet, Rfl: 3   lisinopril  (ZESTRIL ) 40 MG tablet, TAKE 1 TABLET(40 MG) BY MOUTH DAILY, Disp: 90 tablet, Rfl: 2   MAGNESIUM  PO, Take by mouth., Disp: , Rfl:    meloxicam  (MOBIC ) 7.5 MG tablet, Take 1 tablet (7.5 mg total) by mouth daily as needed for pain take with food, Disp: 30 tablet, Rfl: 0   potassium chloride  (KLOR-CON ) 10 MEQ tablet, Take 1 tablet (10 mEq total) by mouth daily., Disp: 30 tablet, Rfl: 3   RETIN-A 0.05 % cream, Apply topically at bedtime., Disp: , Rfl:    TURMERIC PO, Take 1 tablet by mouth daily., Disp: , Rfl:   Current Facility-Administered Medications:    [START ON 12/07/2023] denosumab  (PROLIA ) injection 60 mg, 60 mg, Subcutaneous, Once, Burns, Kristina Bourgeois, MD   Objective:     There were no vitals filed for this visit.    There is no height or weight on file to calculate BMI.    Physical Exam:    ***   Electronically signed by:  Kristina Ayala [facility] 7:44 AM 06/29/23

## 2023-06-30 ENCOUNTER — Ambulatory Visit (INDEPENDENT_AMBULATORY_CARE_PROVIDER_SITE_OTHER)

## 2023-06-30 ENCOUNTER — Ambulatory Visit: Admitting: Sports Medicine

## 2023-06-30 VITALS — HR 89 | Ht 64.0 in | Wt 158.0 lb

## 2023-06-30 DIAGNOSIS — M25562 Pain in left knee: Secondary | ICD-10-CM

## 2023-06-30 DIAGNOSIS — Z4789 Encounter for other orthopedic aftercare: Secondary | ICD-10-CM | POA: Diagnosis not present

## 2023-06-30 DIAGNOSIS — M1732 Unilateral post-traumatic osteoarthritis, left knee: Secondary | ICD-10-CM

## 2023-06-30 DIAGNOSIS — G8929 Other chronic pain: Secondary | ICD-10-CM

## 2023-06-30 MED ORDER — TRIAMCINOLONE ACETONIDE 32 MG IX SRER
32.0000 mg | Freq: Once | INTRA_ARTICULAR | Status: AC
  Administered 2023-06-30: 32 mg via INTRA_ARTICULAR

## 2023-06-30 NOTE — Patient Instructions (Addendum)
 Tylenol  [id number] mg 2-3 times a day for pain relief  Meloxicam  7.5 mg as needed  for breakthrough pain limit to 1 time per week  As needed follow up

## 2023-07-04 ENCOUNTER — Ambulatory Visit: Payer: Self-pay | Admitting: Sports Medicine

## 2023-07-05 NOTE — Progress Notes (Unsigned)
 [facility] [address] [place] [address] Phone: [phone] Subjective:   IBryan Caprio, am serving as a scribe for Dr. Ronnell Coins.  I'm seeing this patient by the request  of:  Colene Dauphin, MD  CC: Back pain and multiple complaint follow-up  [id number]  Kassadi A Pariseau is a 78 y.o. female coming in with complaint of left knee pain.  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 Given a long-acting steroid injection on June 5 in the left knee. Zilretta  is helping the knees and the pain in the back is minimal.       Past Medical History:  Diagnosis Date   Arthritis    Bursitis    left elbow   Clostridium difficile infection    Diabetes mellitus without complication (HCC)    Frequent falls    HLD (hyperlipidemia)    Hypertension    Infectious colitis    Past Surgical History:  Procedure Laterality Date   CATARACT EXTRACTION Bilateral    COLONOSCOPY     KNEE SURGERY  2014   ORIF PROXIMAL TIBIAL PLATEAU FRACTURE Left 02/19/2012   knee   POLYPECTOMY     TONSILLECTOMY     Social History   Socioeconomic History   Marital status: Divorced    Spouse name: Not on file   Number of children: 2   Years of education: Not on file   Highest education level: Some college, no degree  Occupational History   Occupation: RETIRED  Tobacco Use   Smoking status: Former    Current packs/day: 0.00    Average packs/day: 0.3 packs/day for 50.0 years (12.5 ttl pk-yrs)    Types: Cigarettes    Start date: 08/27/1964    Quit date: 08/28/2014    Years since quitting: 8.8   Smokeless tobacco: Never   Tobacco comments:    1 pack every 3 days  Vaping Use   Vaping status: Never Used  Substance and Sexual Activity   Alcohol use: No    Alcohol/week: 0.0 standard drinks of alcohol   Drug use: No   Sexual activity: Not Currently  Other Topics Concern   Not on file  Social History Narrative   Lives alone     Right handed   Caffeine: 3x weekly         Social Drivers of Health   Financial Resource Strain: Low Risk  (08/26/2022)   Overall Financial Resource Strain (CARDIA)    Difficulty of Paying Living Expenses: Not hard at all  Food Insecurity: No Food Insecurity (08/26/2022)   Hunger Vital Sign    Worried About Running Out of Food in the Last Year: Never true    Ran Out of Food in the Last Year: Never true  Transportation Needs: No Transportation Needs (08/26/2022)   PRAPARE - Administrator, Civil Service (Medical): No    Lack of Transportation (Non-Medical): No  Physical Activity: Inactive (08/26/2022)   Exercise Vital Sign    Days of Exercise per Week: 0 days    Minutes of Exercise per Session: 0 min  Stress: No Stress Concern Present (08/26/2022)   [employer] of Occupational Health - Occupational Stress Questionnaire    Feeling of Stress : Only a little  Social Connections: Moderately Integrated (08/26/2022)   Social Connection and Isolation Panel [NHANES]    Frequency of Communication with Friends and Family: More than three times a week    Frequency  of Social Gatherings with Friends and Family: Three times a week    Attends Religious Services: More than 4 times per year    Active Member of Clubs or Organizations: Yes    Attends Banker Meetings: More than 4 times per year    Marital Status: Divorced   Allergies  Allergen Reactions   Doxycycline Diarrhea   Minocycline Diarrhea   Wellbutrin  [Bupropion ] Swelling    Per pt her tongue was swollen and sore/symptoms stopped once the medication was discontinued.    Lactose Intolerance (Gi) Diarrhea   Cymbalta  [Duloxetine  Hcl] Other (See Comments)    Disorientated, dizzy, loss of appetite, stomach discomfort   Penicillins Rash    Has patient had a PCN reaction causing immediate rash, facial/tongue/throat swelling, SOB or lightheadedness with hypotension: no Has patient had a PCN reaction causing severe  rash involving mucus membranes or skin necrosis: no Has patient had a PCN reaction that required hospitalization: unknown Has patient had a PCN reaction occurring within the last 10 years: no If all of the above answers are NO, then may proceed with Cephalosporin use.    Z-Pak [Azithromycin ] Other (See Comments)    diarrhea   Family History  Problem Relation Age of Onset   Diverticulosis Mother    Ovarian cancer Mother    Pancreatic cancer Father    Lung cancer Father    Colon polyps Sister    Heart failure Daughter    Diabetes Son    Colon cancer Neg Hx    Breast cancer Neg Hx    Esophageal cancer Neg Hx    Stomach cancer Neg Hx     Current Outpatient Medications (Endocrine & Metabolic):    denosumab  (PROLIA ) 60 MG/ML SOSY injection, INJECT 60 UNITS UNDER THE SKIN AS DIRECTED  Current Facility-Administered Medications (Endocrine & Metabolic):    [START ON 12/07/2023] denosumab  (PROLIA ) injection 60 mg  Current Outpatient Medications (Cardiovascular):    amLODipine  (NORVASC ) 10 MG tablet,    atorvastatin  (LIPITOR) 40 MG tablet, TAKE 1 TABLET(40 MG) BY MOUTH DAILY. FOLLOW-UP APPOINTMENT WITH LABS ARE DUE   hydrochlorothiazide  (HYDRODIURIL ) 25 MG tablet, TAKE 1 TABLET(25 MG) BY MOUTH EVERY MORNING   lisinopril  (ZESTRIL ) 40 MG tablet, TAKE 1 TABLET(40 MG) BY MOUTH DAILY   Current Outpatient Medications (Respiratory):    albuterol  (VENTOLIN  HFA) 108 (90 Base) MCG/ACT inhaler, SMARTSIG:1-2 Puff(s) By Mouth Every 6 Hours PRN   fluticasone  (FLONASE ) 50 MCG/ACT nasal spray, Place 2 sprays into both nostrils daily.   Current Outpatient Medications (Analgesics):    meloxicam  (MOBIC ) 7.5 MG tablet, Take 1 tablet (7.5 mg total) by mouth daily as needed for pain take with food   Current Outpatient Medications (Hematological):    Cyanocobalamin  (VITAMIN B-12) 2500 MCG SUBL, Place 2,500 mcg under the tongue daily.   Current Outpatient Medications (Other):    ALPRAZolam  (XANAX )  0.5 MG tablet, TAKE 1 TABLET(0.5 MG) BY MOUTH EVERY DAY   cholecalciferol (VITAMIN D ) 25 MCG (1000 UNIT) tablet, Take 2,000 Units by mouth daily.   diclofenac  Sodium (VOLTAREN ) 1 % GEL, Apply 2 g topically 4 (four) times daily. To affected joint.   gabapentin  (NEURONTIN ) 100 MG capsule, Take 1 capsule (100 mg total) by mouth at bedtime as needed.   MAGNESIUM  PO, Take by mouth.   potassium chloride  (KLOR-CON ) 10 MEQ tablet, Take 1 tablet (10 mEq total) by mouth daily.   RETIN-A 0.05 % cream, Apply topically at bedtime.   TURMERIC PO, Take 1 tablet by  mouth daily.    Reviewed prior external information including notes and imaging from  primary care provider As well as notes that were available from care everywhere and other healthcare systems.  Past medical history, social, surgical and family history all reviewed in electronic medical record.  No pertanent information unless stated regarding to the chief complaint.   Review of Systems:  No headache, visual changes, nausea, vomiting, diarrhea, constipation, dizziness, abdominal pain, skin rash, fevers, chills, night sweats, weight loss, swollen lymph nodes, body aches, joint swelling, chest pain, shortness of breath, mood changes. POSITIVE muscle aches  Objective  Blood pressure 126/78, pulse 85, height 5' 4 (1.626 m), weight 156 lb (70.8 kg), SpO2 96%.   General: No apparent distress alert and oriented x3 mood and affect normal, dressed appropriately.  HEENT: Pupils equal, extraocular movements intact  Respiratory: Patient's speak in full sentences and does not appear short of breath  Cardiovascular: No lower extremity edema, non tender, no erythema  Back exam does have some loss lordosis, no tenderness noted. Knee exam does still have some swelling noted.  Some mild crepitus noted.   Impression and Recommendations:    The above documentation has been reviewed and is accurate and complete Delaney Schnick M Levaeh Vice, DO

## 2023-07-06 ENCOUNTER — Ambulatory Visit (INDEPENDENT_AMBULATORY_CARE_PROVIDER_SITE_OTHER): Admitting: Family Medicine

## 2023-07-06 VITALS — BP 126/78 | HR 85 | Ht 64.0 in | Wt 156.0 lb

## 2023-07-06 DIAGNOSIS — M5416 Radiculopathy, lumbar region: Secondary | ICD-10-CM | POA: Diagnosis not present

## 2023-07-06 DIAGNOSIS — M1732 Unilateral post-traumatic osteoarthritis, left knee: Secondary | ICD-10-CM | POA: Diagnosis not present

## 2023-07-06 NOTE — Patient Instructions (Addendum)
 Ultra shoes from [facility] Keep doing exercises and be aware of balance Epidural is good idea See you again in 2 months

## 2023-07-06 NOTE — Assessment & Plan Note (Signed)
 Discussed icing regimen and home exercises.  Discussed worsening pain to get the epidural that she has responded to previously and she is scheduled at the moment on June 26.  Increase activity slowly.  Follow-up again in 6 to 8 weeks.

## 2023-07-06 NOTE — Assessment & Plan Note (Signed)
 Significant arthritic changes, has been responded to injections by us  as well as by another provider.  Was given more of a long-acting steroid and feels like this has been more beneficial.  Hopefully will continue.  Still wants to avoid surgical intervention.

## 2023-07-14 ENCOUNTER — Ambulatory Visit: Payer: 59 | Admitting: Internal Medicine

## 2023-07-18 ENCOUNTER — Other Ambulatory Visit: Payer: Self-pay | Admitting: Internal Medicine

## 2023-07-20 NOTE — Discharge Instructions (Signed)

## 2023-07-21 ENCOUNTER — Ambulatory Visit
Admission: RE | Admit: 2023-07-21 | Discharge: 2023-07-21 | Disposition: A | Discharge status: Home / Self Care | Source: Ambulatory Visit | Attending: Family Medicine | Admitting: Family Medicine

## 2023-07-21 DIAGNOSIS — M5416 Radiculopathy, lumbar region: Secondary | ICD-10-CM

## 2023-07-21 DIAGNOSIS — M4727 Other spondylosis with radiculopathy, lumbosacral region: Secondary | ICD-10-CM | POA: Diagnosis not present

## 2023-07-21 MED ORDER — METHYLPREDNISOLONE ACETATE 40 MG/ML INJ SUSP (RADIOLOG
80.0000 mg | Freq: Once | INTRAMUSCULAR | Status: AC
  Administered 2023-07-21: 80 mg via EPIDURAL

## 2023-07-21 MED ORDER — IOPAMIDOL (ISOVUE-M 200) INJECTION 41%
1.0000 mL | Freq: Once | INTRAMUSCULAR | Status: AC
Start: 2023-07-21 — End: 2023-07-21
  Administered 2023-07-21: 1 mL via EPIDURAL

## 2023-07-25 ENCOUNTER — Other Ambulatory Visit: Payer: Self-pay | Admitting: Internal Medicine

## 2023-07-25 DIAGNOSIS — L821 Other seborrheic keratosis: Secondary | ICD-10-CM | POA: Diagnosis not present

## 2023-08-04 ENCOUNTER — Telehealth: Payer: Self-pay | Admitting: Family Medicine

## 2023-08-04 ENCOUNTER — Other Ambulatory Visit: Payer: Self-pay | Admitting: Family Medicine

## 2023-08-04 DIAGNOSIS — R2689 Other abnormalities of gait and mobility: Secondary | ICD-10-CM

## 2023-08-04 DIAGNOSIS — R269 Unspecified abnormalities of gait and mobility: Secondary | ICD-10-CM

## 2023-08-04 NOTE — Telephone Encounter (Signed)
 Referral sent to [facility]

## 2023-08-04 NOTE — Telephone Encounter (Signed)
 Patient called and would like Dr. Claudene to send a referral for Physical therapy for her balance for walking and she would like to go to [facility] where she went last time. Please advise.

## 2023-08-10 ENCOUNTER — Telehealth: Payer: Self-pay | Admitting: Family Medicine

## 2023-08-10 NOTE — Telephone Encounter (Signed)
 Patient called stating that she fell this morning and hit her head. She has had a headache ever since and is experiencing neck pain.  I advised that she go to the emergency room to be evaluated.  FYI.

## 2023-08-10 NOTE — Telephone Encounter (Signed)
 Patient returned call, see additional phone note.  Advised to go to ED.

## 2023-08-10 NOTE — Telephone Encounter (Signed)
 Dr Claudene aware. See other phone note.

## 2023-08-10 NOTE — Telephone Encounter (Signed)
 Left message for patient. Per a verbal from Dr. Claudene, patient should proceed to ED. Recommendation left on VM for patient.

## 2023-08-10 NOTE — Telephone Encounter (Signed)
 Patient called and stated she fell this morning around 7:30. She hit her head and has hurt her neck. She was looking up some things to do but does not know what to do. She put an ice pack on her head to keep from swelling and took two acetaminophen . She said her neck hurts and she put some Voltaren  on it. She would like to know what she should do next. Please advise.

## 2023-08-11 ENCOUNTER — Emergency Department (HOSPITAL_COMMUNITY)

## 2023-08-11 ENCOUNTER — Emergency Department (HOSPITAL_COMMUNITY)
Admission: EM | Admit: 2023-08-11 | Discharge: 2023-08-11 | Disposition: A | Discharge status: Home / Self Care | Attending: Emergency Medicine | Admitting: Emergency Medicine

## 2023-08-11 DIAGNOSIS — S161XXA Strain of muscle, fascia and tendon at neck level, initial encounter: Secondary | ICD-10-CM | POA: Diagnosis not present

## 2023-08-11 DIAGNOSIS — W19XXXA Unspecified fall, initial encounter: Secondary | ICD-10-CM

## 2023-08-11 DIAGNOSIS — S0990XA Unspecified injury of head, initial encounter: Secondary | ICD-10-CM | POA: Insufficient documentation

## 2023-08-11 DIAGNOSIS — Y92008 Other place in unspecified non-institutional (private) residence as the place of occurrence of the external cause: Secondary | ICD-10-CM | POA: Insufficient documentation

## 2023-08-11 DIAGNOSIS — M542 Cervicalgia: Secondary | ICD-10-CM | POA: Diagnosis not present

## 2023-08-11 DIAGNOSIS — W01198A Fall on same level from slipping, tripping and stumbling with subsequent striking against other object, initial encounter: Secondary | ICD-10-CM | POA: Insufficient documentation

## 2023-08-11 DIAGNOSIS — R519 Headache, unspecified: Secondary | ICD-10-CM | POA: Diagnosis not present

## 2023-08-11 DIAGNOSIS — M4802 Spinal stenosis, cervical region: Secondary | ICD-10-CM | POA: Diagnosis not present

## 2023-08-11 DIAGNOSIS — Z79899 Other long term (current) drug therapy: Secondary | ICD-10-CM | POA: Diagnosis not present

## 2023-08-11 DIAGNOSIS — M47812 Spondylosis without myelopathy or radiculopathy, cervical region: Secondary | ICD-10-CM | POA: Diagnosis not present

## 2023-08-11 DIAGNOSIS — I10 Essential (primary) hypertension: Secondary | ICD-10-CM | POA: Diagnosis not present

## 2023-08-11 DIAGNOSIS — S199XXA Unspecified injury of neck, initial encounter: Secondary | ICD-10-CM | POA: Diagnosis not present

## 2023-08-11 DIAGNOSIS — E119 Type 2 diabetes mellitus without complications: Secondary | ICD-10-CM | POA: Insufficient documentation

## 2023-08-11 NOTE — ED Provider Notes (Signed)
 [place] EMERGENCY DEPARTMENT AT [facility] Provider Note   CSN: [id number] Arrival date & time: 08/11/23  [id number]     Patient presents with: Fall, Neck Injury, and Head Injury   Kristina Ayala is a 78 y.o. female.    Fall  Neck Injury  Head Injury Patient presents after head injury yesterday.  Clemens forward striking her head on the railing of her porch.  Used ice on her head.  States now pain in her head and neck pain.  No numbness or weakness.  No other review.  Not on blood pressure.    Past Medical History:  Diagnosis Date   Arthritis    Bursitis    left elbow   Clostridium difficile infection    Diabetes mellitus without complication (HCC)    Frequent falls    HLD (hyperlipidemia)    Hypertension    Infectious colitis     Prior to Admission medications   Medication Sig Start Date End Date Taking? Authorizing Provider  albuterol  (VENTOLIN  HFA) 108 (90 Base) MCG/ACT inhaler SMARTSIG:1-2 Puff(s) By Mouth Every 6 Hours PRN 05/13/23   [provider]  ALPRAZolam  (XANAX ) 0.5 MG tablet TAKE 1 TABLET(0.5 MG) BY MOUTH EVERY DAY 07/26/23   Burns, Glade PARAS, MD  amLODipine  (NORVASC ) 10 MG tablet     [provider]  atorvastatin  (LIPITOR) 40 MG tablet TAKE 1 TABLET(40 MG) BY MOUTH DAILY. FOLLOW-UP APPOINTMENT WITH LABS ARE DUE 01/13/23   Geofm Glade PARAS, MD  cholecalciferol (VITAMIN D ) 25 MCG (1000 UNIT) tablet Take 2,000 Units by mouth daily.    [provider]  Cyanocobalamin  (VITAMIN B-12) 2500 MCG SUBL Place 2,500 mcg under the tongue daily.    [provider]  denosumab  (PROLIA ) 60 MG/ML SOSY injection INJECT 60 UNITS UNDER THE SKIN AS DIRECTED 06/01/23   Geofm Glade PARAS, MD  diclofenac  Sodium (VOLTAREN ) 1 % GEL Apply 2 g topically 4 (four) times daily. To affected joint. 03/04/23   Geofm Glade PARAS, MD  fluticasone  (FLONASE ) 50 MCG/ACT nasal spray Place 2 sprays into both nostrils daily. 10/07/20   Geofm Glade PARAS, MD  gabapentin   (NEURONTIN ) 100 MG capsule Take 1 capsule (100 mg total) by mouth at bedtime as needed. 06/21/23   Geofm Glade PARAS, MD  hydrochlorothiazide  (HYDRODIURIL ) 25 MG tablet TAKE 1 TABLET(25 MG) BY MOUTH EVERY MORNING 06/03/23   Burns, Glade PARAS, MD  lisinopril  (ZESTRIL ) 40 MG tablet TAKE 1 TABLET(40 MG) BY MOUTH DAILY 05/17/23   Geofm Glade PARAS, MD  MAGNESIUM  PO Take by mouth.    [provider]  meloxicam  (MOBIC ) 7.5 MG tablet Take 1 tablet (7.5 mg total) by mouth daily as needed for pain take with food 03/04/23     potassium chloride  (KLOR-CON ) 10 MEQ tablet TAKE 1 TABLET(10 MEQ) BY MOUTH DAILY 07/18/23   Burns, Glade PARAS, MD  RETIN-A 0.05 % cream Apply topically at bedtime. 06/10/23   [provider]  TURMERIC PO Take 1 tablet by mouth daily.    [provider]    Allergies: Doxycycline, Minocycline, Wellbutrin  [bupropion ], Lactose intolerance (gi), Cymbalta  [duloxetine  hcl], Penicillins, and Z-pak [azithromycin ]    Review of Systems  Updated Vital Signs BP (!) 148/82 (BP Location: Right Arm)   Pulse 79   Temp 98.3 F (36.8 C) (Oral)   Resp 17   SpO2 99%   Physical Exam Vitals reviewed.  HENT:     Head:     Comments: No clear hematoma. Cardiovascular:  Rate and Rhythm: Normal rate.  Musculoskeletal:     Cervical back: Neck supple. No tenderness.  Neurological:     Mental Status: She is alert. Mental status is at baseline.     (all labs ordered are listed, but only abnormal results are displayed) Labs Reviewed - No data to display  EKG: None  Radiology: CT HEAD WO CONTRAST ( ) Result Date: 08/11/2023 CLINICAL DATA:  78 year old female status post fall yesterday. Headache, pain. EXAM: CT HEAD WITHOUT CONTRAST TECHNIQUE: Contiguous axial images were obtained from the base of the skull through the vertex without intravenous contrast. RADIATION DOSE REDUCTION: This exam was performed according to the departmental dose-optimization program which includes automated  exposure control, adjustment of the mA and/or kV according to patient size and/or use of iterative reconstruction technique. COMPARISON:  Head CT 06/01/2018. FINDINGS: Brain: . normal cerebral volume for age. No midline shift, ventriculomegaly, mass effect, evidence of mass lesion, intracranial hemorrhage or evidence of cortically based acute infarction. Patchy bilateral cerebral white matter hypodensity is chronic, but progressed since 2020. Moderate for age changes in both hemispheres and progression in the left anterior internal capsule. Vascular: Calcified atherosclerosis at the skull base. No suspicious intracranial vascular hyperdensity. Skull: Stable and intact.  No acute osseous abnormality identified. Sinuses/Orbits: Visualized paranasal sinuses and mastoids are clear. Other: No orbit or scalp soft tissue injury identified. IMPRESSION: 1. No acute intracranial abnormality or acute traumatic injury identified. 2. 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 Electronically Signed   By: VEAR Hurst M.D.   On: 08/11/2023 11:19   CT Cervical Spine Wo Contrast Result Date: 08/11/2023 CLINICAL DATA:  Patient slipped and fell from a chair yesterday morning. Neck pain and tenderness headache. EXAM: CT CERVICAL SPINE WITHOUT CONTRAST TECHNIQUE: Multidetector CT imaging of the cervical spine was performed without intravenous contrast. Multiplanar CT image reconstructions were also generated. RADIATION DOSE REDUCTION: This exam was performed according to the departmental dose-optimization program which includes automated exposure control, adjustment of the mA and/or kV according to patient size and/or use of iterative reconstruction technique. COMPARISON:  Cervical spine radiographs 08/02/2012. MRI cervical spine 08/02/2012. Chest radiographs 04/13/2021 FINDINGS: Alignment: Straightening of the usual cervical lordosis without focal angulation or significant listhesis. Skull base and vertebrae: No  evidence of acute fracture or traumatic subluxation. Soft tissues and spinal canal: No prevertebral fluid or swelling. No visible canal hematoma. Disc levels: Relatively mild multilevel spondylosis with disc space narrowing and uncinate spurring greatest at C4-5 and C6-7. There is right-greater-than-left foraminal narrowing at C6-7. No large disc herniation identified. Upper chest: Images through the lung apices are degraded by motion artifact. There is a solid-appearing left apical nodule which measures 8 x 5 mm on image 78/3. Other smaller nodules are present bilaterally. These are not clearly seen on available prior imaging. The thyroid  gland appears mildly enlarged and heterogeneous. Other: None. IMPRESSION: 1. No evidence of acute cervical spine fracture, traumatic subluxation or static signs of instability. 2. Mild multilevel cervical spondylosis with right-greater-than-left foraminal narrowing at C6-7. 3. Multiple pulmonary nodules, indeterminate. Most significant: Left solid pulmonary nodule within the upper lobe measuring 7 mm. Per Fleischner Society Guidelines, recommend a non-contrast Chest CT at 3-6 months, then consider another non-contrast Chest CT at 18-24 months. If patient is low risk for malignancy, non-contrast Chest CT at 18-24 months is optional. These guidelines do not apply to immunocompromised patients and patients with cancer. Follow up in patients with significant comorbidities as clinically warranted. For lung cancer  screening, adhere to Lung-RADS guidelines. Reference: Radiology. 2017; 284(1):228-43. 4. Incidental heterogeneous and enlarged thyroid . Recommend non-emergent thyroid  ultrasound. Reference: J Am Coll Radiol. 2015 Feb;12(2): 143-50 Electronically Signed   By: Elsie Perone M.D.   On: 08/11/2023 10:52     Procedures   Medications Ordered in the ED - No data to display                                  Medical Decision Making Amount and/or Complexity of Data  Reviewed Radiology: ordered.   Patient with fall.  Hit her head yesterday.  Due to age is high risk.  Will get head CT and cervical spine CT.  No other injury.  Differential diagnosis includes minor head injury, intracranial hemorrhage, cervical spine fracture.  Head CT and cervical spine CT reassuring.  No definite injury.  However did have lung nodules which will need to be followed.  Informed patient.  PCP can follow-up.  Will discharge home.       Final diagnoses:  Fall, initial encounter  Strain of neck muscle, initial encounter  Minor head injury, initial encounter    ED Discharge Orders     None          Patsey Lot, MD 08/11/23 1147

## 2023-08-11 NOTE — ED Triage Notes (Signed)
 Patient in today reporting fall yesterday morning hitting head after slipping out of chair. Reports neck pain and headache today. Denies n/v.

## 2023-08-11 NOTE — Discharge Instructions (Signed)
 There were some nodules in your lungs on your CAT scan.  Follow-up with your primary doctor for management of this.

## 2023-08-22 ENCOUNTER — Encounter: Payer: Self-pay | Admitting: Internal Medicine

## 2023-08-22 NOTE — Progress Notes (Unsigned)
 Subjective:    Patient ID: Kristina Ayala, female    DOB: 24-Jan-1946, 78 y.o.   MRN: [id number]     HPI Carisma is here for follow up from the hospital  ED 7/17 - presented with fall, neck injury and head injury.    She fell forward and hit her head on the railing of the porch.  She applied ice.   Had pain in head and neck.  No n/t or weakness.  Ct head  - negative for acute injury.  Ct C-spine  - no fracture of acute injury.   Ct spine showed thyroid  nodule and lung nodules  Has been losing weight.  No exercise.  Has been skipping meals  - skips breakfast often.  Will eat in the afternoon.    Medications and allergies reviewed with patient and updated if appropriate.  Current Outpatient Medications on File Prior to Visit  Medication Sig Dispense Refill   albuterol  (VENTOLIN  HFA) 108 (90 Base) MCG/ACT inhaler SMARTSIG:1-2 Puff(s) By Mouth Every 6 Hours PRN     ALPRAZolam  (XANAX ) 0.5 MG tablet TAKE 1 TABLET(0.5 MG) BY MOUTH EVERY DAY 30 tablet 4   amLODipine  (NORVASC ) 10 MG tablet      atorvastatin  (LIPITOR) 40 MG tablet TAKE 1 TABLET(40 MG) BY MOUTH DAILY. FOLLOW-UP APPOINTMENT WITH LABS ARE DUE 90 tablet 1   cholecalciferol (VITAMIN D ) 25 MCG (1000 UNIT) tablet Take 2,000 Units by mouth daily.     Cyanocobalamin  (VITAMIN B-12) 2500 MCG SUBL Place 2,500 mcg under the tongue daily.     denosumab  (PROLIA ) 60 MG/ML SOSY injection INJECT 60 UNITS UNDER THE SKIN AS DIRECTED 1 mL 0   diclofenac  Sodium (VOLTAREN ) 1 % GEL Apply 2 g topically 4 (four) times daily. To affected joint. 100 g 11   fluticasone  (FLONASE ) 50 MCG/ACT nasal spray Place 2 sprays into both nostrils daily. 16 g 6   gabapentin  (NEURONTIN ) 100 MG capsule Take 1 capsule (100 mg total) by mouth at bedtime as needed.     hydrochlorothiazide  (HYDRODIURIL ) 25 MG tablet TAKE 1 TABLET(25 MG) BY MOUTH EVERY MORNING 90 tablet 3   hydrocortisone 2.5 % cream Apply topically.     lisinopril  (ZESTRIL ) 40 MG tablet TAKE 1  TABLET(40 MG) BY MOUTH DAILY 90 tablet 2   MAGNESIUM  PO Take by mouth.     meloxicam  (MOBIC ) 7.5 MG tablet Take 1 tablet (7.5 mg total) by mouth daily as needed for pain take with food 30 tablet 0   potassium chloride  (KLOR-CON ) 10 MEQ tablet TAKE 1 TABLET(10 MEQ) BY MOUTH DAILY 30 tablet 3   RETIN-A 0.05 % cream Apply topically at bedtime.     TURMERIC PO Take 1 tablet by mouth daily.     Current Facility-Administered Medications on File Prior to Visit  Medication Dose Route Frequency Provider Last Rate Last Admin   [START ON 12/07/2023] denosumab  (PROLIA ) injection 60 mg  60 mg Subcutaneous Once Geofm Glade PARAS, MD         Review of Systems  Constitutional:  Positive for unexpected weight change. Negative for fever.  Respiratory:  Negative for cough, shortness of breath and wheezing.        Objective:   Vitals:   08/23/23 1111  BP: 118/68  Pulse: 80  Temp: 98.3 F (36.8 C)  SpO2: 98%   BP Readings from Last 3 Encounters:  08/23/23 118/68  08/11/23 (!) 148/82  07/21/23 (!) 164/89   Wt Readings from Last  3 Encounters:  08/23/23 154 lb (69.9 kg)  07/06/23 156 lb (70.8 kg)  06/30/23 158 lb (71.7 kg)   Body mass index is 26.43 kg/m.    Physical Exam Constitutional:      General: She is not in acute distress.    Appearance: Normal appearance.  HENT:     Head: Normocephalic and atraumatic.  Eyes:     Conjunctiva/sclera: Conjunctivae normal.  Cardiovascular:     Rate and Rhythm: Normal rate and regular rhythm.     Heart sounds: Normal heart sounds.  Pulmonary:     Effort: Pulmonary effort is normal. No respiratory distress.     Breath sounds: Normal breath sounds. No wheezing.  Musculoskeletal:     Cervical back: Neck supple.     Right lower leg: No edema.     Left lower leg: No edema.  Lymphadenopathy:     Cervical: No cervical adenopathy.  Skin:    General: Skin is warm and dry.     Findings: No rash.  Neurological:     Mental Status: She is alert. Mental  status is at baseline.  Psychiatric:        Mood and Affect: Mood normal.        Behavior: Behavior normal.        Lab Results  Component Value Date   WBC 6.5 01/13/2023   HGB 13.8 01/13/2023   HCT 42.1 01/13/2023   PLT 323.0 01/13/2023   GLUCOSE 86 06/21/2023   CHOL 193 06/21/2023   TRIG 132.0 06/21/2023   HDL 54.10 06/21/2023   LDLDIRECT 213.1 05/22/2012   LDLCALC 112 (H) 06/21/2023   ALT 26 06/21/2023   AST 20 06/21/2023   NA 141 06/21/2023   K 3.4 (L) 06/21/2023   CL 102 06/21/2023   CREATININE 0.64 06/21/2023   BUN 16 06/21/2023   CO2 29 06/21/2023   TSH 0.65 01/13/2023   HGBA1C 6.1 06/21/2023   CT HEAD WO CONTRAST ( ) CLINICAL DATA:  78 year old female status post fall yesterday. Headache, pain.  EXAM: CT HEAD WITHOUT CONTRAST  TECHNIQUE: Contiguous axial images were obtained from the base of the skull through the vertex without intravenous contrast.  RADIATION DOSE REDUCTION: This exam was performed according to the departmental dose-optimization program which includes automated exposure control, adjustment of the mA and/or kV according to patient size and/or use of iterative reconstruction technique.  COMPARISON:  Head CT 06/01/2018.  FINDINGS: Brain: . normal cerebral volume for age. No midline shift, ventriculomegaly, mass effect, evidence of mass lesion, intracranial hemorrhage or evidence of cortically based acute infarction. Patchy bilateral cerebral white matter hypodensity is chronic, but progressed since 2020. Moderate for age changes in both hemispheres and progression in the left anterior internal capsule.  Vascular: Calcified atherosclerosis at the skull base. No suspicious intracranial vascular hyperdensity.  Skull: Stable and intact.  No acute osseous abnormality identified.  Sinuses/Orbits: Visualized paranasal sinuses and mastoids are clear.  Other: No orbit or scalp soft tissue injury identified.  IMPRESSION: 1. No acute  intracranial abnormality or acute traumatic injury identified. 2. 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  Electronically Signed   By: VEAR Hurst M.D.   On: 08/11/2023 11:19 CT Cervical Spine Wo Contrast CLINICAL DATA:  Patient slipped and fell from a chair yesterday morning. Neck pain and tenderness headache.  EXAM: CT CERVICAL SPINE WITHOUT CONTRAST  TECHNIQUE: Multidetector CT imaging of the cervical spine was performed without intravenous contrast. Multiplanar CT image reconstructions were also  generated.  RADIATION DOSE REDUCTION: This exam was performed according to the departmental dose-optimization program which includes automated exposure control, adjustment of the mA and/or kV according to patient size and/or use of iterative reconstruction technique.  COMPARISON:  Cervical spine radiographs 08/02/2012. MRI cervical spine 08/02/2012. Chest radiographs 04/13/2021  FINDINGS: Alignment: Straightening of the usual cervical lordosis without focal angulation or significant listhesis.  Skull base and vertebrae: No evidence of acute fracture or traumatic subluxation.  Soft tissues and spinal canal: No prevertebral fluid or swelling. No visible canal hematoma.  Disc levels: Relatively mild multilevel spondylosis with disc space narrowing and uncinate spurring greatest at C4-5 and C6-7. There is right-greater-than-left foraminal narrowing at C6-7. No large disc herniation identified.  Upper chest: Images through the lung apices are degraded by motion artifact. There is a solid-appearing left apical nodule which measures 8 x 5 mm on image 78/3. Other smaller nodules are present bilaterally. These are not clearly seen on available prior imaging. The thyroid  gland appears mildly enlarged and heterogeneous.  Other: None.  IMPRESSION: 1. No evidence of acute cervical spine fracture, traumatic subluxation or static signs of instability. 2. Mild  multilevel cervical spondylosis with right-greater-than-left foraminal narrowing at C6-7. 3. Multiple pulmonary nodules, indeterminate. Most significant: Left solid pulmonary nodule within the upper lobe measuring 7 mm. Per Fleischner Society Guidelines, recommend a non-contrast Chest CT at 3-6 months, then consider another non-contrast Chest CT at 18-24 months. If patient is low risk for malignancy, non-contrast Chest CT at 18-24 months is optional. These guidelines do not apply to immunocompromised patients and patients with cancer. Follow up in patients with significant comorbidities as clinically warranted. For lung cancer screening, adhere to Lung-RADS guidelines. Reference: Radiology. 2017; 284(1):228-43. 4. Incidental heterogeneous and enlarged thyroid . Recommend non-emergent thyroid  ultrasound. Reference: J Am Coll Radiol. 2015 Feb;12(2): 143-50  Electronically Signed   By: Elsie Perone M.D.   On: 08/11/2023 10:52    Assessment & Plan:    See Problem List for Assessment and Plan of chronic medical problems.

## 2023-08-22 NOTE — Patient Instructions (Incomplete)
     Medications changes include :   None    An ultrasound of your thyroid  and Ct scan of your lungs were ordered and someone will call you to schedule an appointment.     Return for follow up as scheduled.

## 2023-08-23 ENCOUNTER — Other Ambulatory Visit (HOSPITAL_COMMUNITY): Payer: Self-pay

## 2023-08-23 ENCOUNTER — Ambulatory Visit (INDEPENDENT_AMBULATORY_CARE_PROVIDER_SITE_OTHER): Admitting: Internal Medicine

## 2023-08-23 VITALS — BP 118/68 | HR 80 | Temp 98.3°F | Ht 64.0 in | Wt 154.0 lb

## 2023-08-23 DIAGNOSIS — R918 Other nonspecific abnormal finding of lung field: Secondary | ICD-10-CM

## 2023-08-23 DIAGNOSIS — M545 Low back pain, unspecified: Secondary | ICD-10-CM | POA: Diagnosis not present

## 2023-08-23 DIAGNOSIS — I10 Essential (primary) hypertension: Secondary | ICD-10-CM | POA: Diagnosis not present

## 2023-08-23 DIAGNOSIS — R634 Abnormal weight loss: Secondary | ICD-10-CM

## 2023-08-23 DIAGNOSIS — E010 Iodine-deficiency related diffuse (endemic) goiter: Secondary | ICD-10-CM | POA: Diagnosis not present

## 2023-08-23 MED ORDER — GABAPENTIN 100 MG PO CAPS
100.0000 mg | ORAL_CAPSULE | Freq: Every evening | ORAL | 2 refills | Status: DC | PRN
  Filled 2023-08-23: qty 100, 100d supply, fill #0
  Filled 2023-12-05: qty 50, 50d supply, fill #1

## 2023-08-23 NOTE — Assessment & Plan Note (Signed)
 States unintentional weight loss Over the last several months she has been losing weight She does state that she has been skipping breakfast and would typically get a sausage and cheese biscuit from [employer] Often does not eat until sometime in the afternoon Weight loss likely related to decreased calorie intake Advised her to not skip meals Will monitor weight and expected to stabilize if she increases her calorie intake If weight continues to decrease will need further evaluation Encouraged healthy diet and ideally to get her calories from meals and not supplemental drinks if possible

## 2023-08-23 NOTE — Assessment & Plan Note (Signed)
 Chronic Has had bilateral pulmonary nodules for years-they were monitored for a while and were thought to be stable She does have a left upper lobe 7 mm solid nodule-?  Has had an 8 mm nodule in the left upper lobe-unsure if this is seeing or not Ordered CT chest for follow-up-to be done in 3 months

## 2023-08-23 NOTE — Assessment & Plan Note (Addendum)
 Subacute low back pain Started after a fall Also has some pain in her upper legs Continue meloxicam  7.5 mg daily as needed-encouraged her not to take on a regular basis because of potential side effects Continue gabapentin  100 mg at bedtime as needed-advised not to take regularly or frequently if she does not need it because of potential side effects Gabapentin  refilled

## 2023-08-23 NOTE — Assessment & Plan Note (Signed)
 Chronic Blood pressure controlled Continue amlodipine  10 mg at night, hydrochlorothiazide  25 mg, lisinopril  40 mg in the morning

## 2023-08-23 NOTE — Assessment & Plan Note (Signed)
 Chronic Recent CT scan showed mild thyromegaly-heterogeneous without distinct nodules Will order ultrasound to evaluate further TSH 12/2022 normal

## 2023-08-29 ENCOUNTER — Ambulatory Visit
Admission: RE | Admit: 2023-08-29 | Discharge: 2023-08-29 | Disposition: A | Discharge status: Home / Self Care | Source: Ambulatory Visit | Attending: Internal Medicine | Admitting: Internal Medicine

## 2023-08-29 ENCOUNTER — Ambulatory Visit (INDEPENDENT_AMBULATORY_CARE_PROVIDER_SITE_OTHER): Payer: 59

## 2023-08-29 VITALS — Ht 64.0 in | Wt 154.0 lb

## 2023-08-29 DIAGNOSIS — Z0000 Encounter for general adult medical examination without abnormal findings: Secondary | ICD-10-CM | POA: Diagnosis not present

## 2023-08-29 DIAGNOSIS — E010 Iodine-deficiency related diffuse (endemic) goiter: Secondary | ICD-10-CM

## 2023-08-29 DIAGNOSIS — E042 Nontoxic multinodular goiter: Secondary | ICD-10-CM | POA: Diagnosis not present

## 2023-08-29 DIAGNOSIS — E1169 Type 2 diabetes mellitus with other specified complication: Secondary | ICD-10-CM | POA: Diagnosis not present

## 2023-08-29 NOTE — Progress Notes (Signed)
 Subjective:   Kristina Ayala is a 78 y.o. who presents for a Medicare Wellness preventive visit.  As a reminder, Annual Wellness Visits don't include a physical exam, and some assessments may be limited, especially if this visit is performed virtually. We may recommend an in-person follow-up visit with your provider if needed.  Visit Complete: Virtual I connected with  Kristina Ayala on 08/29/23 by a audio enabled telemedicine application and verified that I am speaking with the correct person using two identifiers.  Patient Location: Home  Provider Location: Office/Clinic  I discussed the limitations of evaluation and management by telemedicine. The patient expressed understanding and agreed to proceed.  Vital Signs: Because this visit was a virtual/telehealth visit, some criteria may be missing or patient reported. Any vitals not documented were not able to be obtained and vitals that have been documented are patient reported.  VideoDeclined- This patient declined Librarian, academic. Therefore the visit was completed with audio only.  Persons Participating in Visit: Patient.  AWV Questionnaire: No: Patient Medicare AWV questionnaire was not completed prior to this visit.  Cardiac Risk Factors include: advanced age (>50men, >47 women);diabetes mellitus;dyslipidemia;hypertension     Objective:    Today's Vitals   08/29/23 0813  Weight: 154 lb (69.9 kg)  Height: 5' 4 (1.626 m)   Body mass index is 26.43 kg/m.     08/29/2023    8:14 AM 08/11/2023    8:04 AM 11/02/2022    4:02 PM 09/30/2022    8:02 AM 08/26/2022    8:51 AM 01/21/2022   10:25 AM 02/20/2021    2:30 PM  Advanced Directives  Does Patient Have a Medical Advance Directive? No No No No No No No  Would patient like information on creating a medical advance directive? No - Patient declined  No - Patient declined No - Patient declined No - Patient declined No - Patient declined No -  Patient declined    Current Medications (verified) Outpatient Encounter Medications as of 08/29/2023  Medication Sig   albuterol  (VENTOLIN  HFA) 108 (90 Base) MCG/ACT inhaler SMARTSIG:1-2 Puff(s) By Mouth Every 6 Hours PRN   ALPRAZolam  (XANAX ) 0.5 MG tablet TAKE 1 TABLET(0.5 MG) BY MOUTH EVERY DAY   amLODipine  (NORVASC ) 10 MG tablet    atorvastatin  (LIPITOR) 40 MG tablet TAKE 1 TABLET(40 MG) BY MOUTH DAILY. FOLLOW-UP APPOINTMENT WITH LABS ARE DUE   cholecalciferol (VITAMIN D ) 25 MCG (1000 UNIT) tablet Take 2,000 Units by mouth daily.   Cyanocobalamin  (VITAMIN B-12) 2500 MCG SUBL Place 2,500 mcg under the tongue daily.   denosumab  (PROLIA ) 60 MG/ML SOSY injection INJECT 60 UNITS UNDER THE SKIN AS DIRECTED   diclofenac  Sodium (VOLTAREN ) 1 % GEL Apply 2 g topically 4 (four) times daily. To affected joint.   fluticasone  (FLONASE ) 50 MCG/ACT nasal spray Place 2 sprays into both nostrils daily.   gabapentin  (NEURONTIN ) 100 MG capsule Take 1 capsule (100 mg total) by mouth at bedtime as needed.   hydrochlorothiazide  (HYDRODIURIL ) 25 MG tablet TAKE 1 TABLET(25 MG) BY MOUTH EVERY MORNING   hydrocortisone 2.5 % cream Apply topically.   lisinopril  (ZESTRIL ) 40 MG tablet TAKE 1 TABLET(40 MG) BY MOUTH DAILY   MAGNESIUM  PO Take by mouth.   meloxicam  (MOBIC ) 7.5 MG tablet Take 1 tablet (7.5 mg total) by mouth daily as needed for pain take with food   potassium chloride  (KLOR-CON ) 10 MEQ tablet TAKE 1 TABLET(10 MEQ) BY MOUTH DAILY   RETIN-A 0.05 % cream  Apply topically at bedtime.   TURMERIC PO Take 1 tablet by mouth daily.   Facility-Administered Encounter Medications as of 08/29/2023  Medication   [START ON 12/07/2023] denosumab  (PROLIA ) injection 60 mg    Allergies (verified) Doxycycline, Minocycline, Wellbutrin  [bupropion ], Lactose intolerance (gi), Cymbalta  [duloxetine  hcl], Penicillins, and Z-pak [azithromycin ]   History: Past Medical History:  Diagnosis Date   Arthritis    Bursitis    left  elbow   Clostridium difficile infection    Diabetes mellitus without complication (HCC)    Frequent falls    HLD (hyperlipidemia)    Hypertension    Infectious colitis    Past Surgical History:  Procedure Laterality Date   CATARACT EXTRACTION Bilateral    COLONOSCOPY     KNEE SURGERY  2014   ORIF PROXIMAL TIBIAL PLATEAU FRACTURE Left 02/19/2012   knee   POLYPECTOMY     TONSILLECTOMY     Family History  Problem Relation Age of Onset   Diverticulosis Mother    Ovarian cancer Mother    Pancreatic cancer Father    Lung cancer Father    Colon polyps Sister    Heart failure Daughter    Diabetes Son    Colon cancer Neg Hx    Breast cancer Neg Hx    Esophageal cancer Neg Hx    Stomach cancer Neg Hx    Social History   Socioeconomic History   Marital status: Divorced    Spouse name: Not on file   Number of children: 2   Years of education: Not on file   Highest education level: Some college, no degree  Occupational History   Occupation: RETIRED  Tobacco Use   Smoking status: Former    Current packs/day: 0.00    Average packs/day: 0.3 packs/day for 50.0 years (12.5 ttl pk-yrs)    Types: Cigarettes    Start date: 08/27/1964    Quit date: 08/28/2014    Years since quitting: 9.0   Smokeless tobacco: Never   Tobacco comments:    1 pack every 3 days  Vaping Use   Vaping status: Never Used  Substance and Sexual Activity   Alcohol use: No    Alcohol/week: 0.0 standard drinks of alcohol   Drug use: No   Sexual activity: Not Currently  Other Topics Concern   Not on file  Social History Narrative   Lives alone    Right handed   Caffeine: 3x weekly         Social Drivers of Health   Financial Resource Strain: Low Risk  (08/29/2023)   Overall Financial Resource Strain (CARDIA)    Difficulty of Paying Living Expenses: Not hard at all  Food Insecurity: No Food Insecurity (08/29/2023)   Hunger Vital Sign    Worried About Running Out of Food in the Last Year: Never true     Ran Out of Food in the Last Year: Never true  Transportation Needs: No Transportation Needs (08/29/2023)   PRAPARE - Administrator, Civil Service (Medical): No    Lack of Transportation (Non-Medical): No  Physical Activity: Insufficiently Active (08/29/2023)   Exercise Vital Sign    Days of Exercise per Week: 5 days    Minutes of Exercise per Session: 10 min  Stress: No Stress Concern Present (08/29/2023)   [employer] of Occupational Health - Occupational Stress Questionnaire    Feeling of Stress: Not at all  Social Connections: Moderately Integrated (08/29/2023)   Social Connection and Isolation Panel  Frequency of Communication with Friends and Family: More than three times a week    Frequency of Social Gatherings with Friends and Family: Three times a week    Attends Religious Services: More than 4 times per year    Active Member of Clubs or Organizations: Yes    Attends Engineer, structural: More than 4 times per year    Marital Status: Divorced    Tobacco Counseling Counseling given: No Tobacco comments: 1 pack every 3 days    Clinical Intake:  Pre-visit preparation completed: Yes  Pain : No/denies pain     BMI - recorded: 26.43 Nutritional Status: BMI 25 -29 Overweight Nutritional Risks: None Diabetes: Yes CBG done?: No Did pt. bring in CBG monitor from home?: No  Lab Results  Component Value Date   HGBA1C 6.1 06/21/2023   HGBA1C 6.2 01/13/2023   HGBA1C 6.2 09/09/2022     How often do you need to have someone help you when you read instructions, pamphlets, or other written materials from your doctor or pharmacy?: 1 - Never  Interpreter Needed?: No  Information entered by :: Verdie Saba, CMA   Activities of Daily Living     08/29/2023    8:17 AM  In your present state of health, do you have any difficulty performing the following activities:  Hearing? 0  Vision? 0  Difficulty concentrating or making decisions? 0  Walking or  climbing stairs? 1  Comment uses a cane prn  Dressing or bathing? 0  Doing errands, shopping? 0  Preparing Food and eating ? N  Using the Toilet? N  In the past six months, have you accidently leaked urine? N  Do you have problems with loss of bowel control? N  Managing your Medications? N  Managing your Finances? N  Housekeeping or managing your Housekeeping? N    Patient Care Team: Geofm Glade PARAS, MD as PCP - General (Internal Medicine) Lonni Slain, MD as PCP - Cardiology (Cardiology) Claudene Arthea HERO, DO as Attending Physician (Family Medicine) Patrcia Sharper, MD as Consulting Physician (Ophthalmology)  I have updated your Care Teams any recent Medical Services you may have received from other providers in the past year.     Assessment:   This is a routine wellness examination for Kristina Ayala.  Hearing/Vision screen Hearing Screening - Comments:: Denies hearing difficulties   Vision Screening - Comments:: Wears rx glasses - up to date with routine eye exams with Dr Patrcia   Goals Addressed               This Visit's Progress     Patient Stated (pt-stated)        Patient stated she plans to work on her balance       Depression Screen     08/29/2023    8:19 AM 08/23/2023   11:19 AM 01/13/2023    9:29 AM 09/09/2022    8:28 AM 08/26/2022    8:54 AM 07/13/2022    9:33 AM 05/13/2022    9:15 AM  PHQ 2/9 Scores  PHQ - 2 Score 0 1 1 1  0 0 0  PHQ- 9 Score 3 4 4 10  0  2    Fall Risk     08/29/2023    8:18 AM 08/23/2023   11:19 AM 03/04/2023    9:51 AM 03/04/2023    9:48 AM 01/13/2023    9:29 AM  Fall Risk   Falls in the past year? 0 1 1 0 0  Number falls in past yr: 1 1 0 0 0  Comment >/= 7 falls      Injury with Fall? 0 1 0 0 0  Risk for fall due to : No Fall Risks;History of fall(s);Impaired balance/gait History of fall(s) No Fall Risks No Fall Risks No Fall Risks  Follow up Falls evaluation completed;Falls prevention discussed Falls evaluation completed  Falls evaluation completed  Falls evaluation completed    MEDICARE RISK AT HOME:  Medicare Risk at Home Any stairs in or around the home?: Yes If so, are there any without handrails?: No Home free of loose throw rugs in walkways, pet beds, electrical cords, etc?: Yes Adequate lighting in your home to reduce risk of falls?: Yes Life alert?: No Use of a cane, walker or w/c?: Yes (cane) Grab bars in the bathroom?: Yes Shower chair or bench in shower?: No Elevated toilet seat or a handicapped toilet?: Yes  TIMED UP AND GO:  Was the test performed?  No  Cognitive Function: 6CIT completed        08/29/2023    8:21 AM 08/26/2022    8:57 AM 01/10/2019    1:29 PM  6CIT Screen  What Year? 0 points 0 points 0 points  What month? 0 points 0 points 0 points  What time? 0 points 0 points 0 points  Count back from 20 0 points 0 points 0 points  Months in reverse 0 points 0 points 0 points  Repeat phrase 0 points 0 points 0 points  Total Score 0 points 0 points 0 points    Immunizations Immunization History  Administered Date(s) Administered   Fluad Quad(high Dose 65+) 10/18/2018, 10/07/2020, 10/01/2021   Fluad Trivalent(High Dose 65+) 11/09/2022   Influenza, High Dose Seasonal PF 11/10/2016, 10/28/2017, 12/28/2019   Influenza,inj,Quad PF,6+ Mos 11/12/2013, 11/27/2014   Influenza-Unspecified 11/26/2015   PFIZER(Purple Top)SARS-COV-2 Vaccination 03/19/2019, 04/09/2019, 11/19/2019   PNEUMOCOCCAL CONJUGATE-20 01/14/2021   Pfizer Covid-19 Vaccine Bivalent Booster 70yrs & up 05/08/2021   Pfizer(Comirnaty)Fall Seasonal Vaccine 12 years and older 11/10/2022   Pneumococcal Conjugate-13 04/30/2015   Pneumococcal Polysaccharide-23 01/04/2014   RSV,unspecified 06/15/2023   Tdap 07/29/2014   Zoster Recombinant(Shingrix) 02/20/2019, 09/13/2019    Screening Tests Health Maintenance  Topic Date Due   FOOT EXAM  Never done   Diabetic kidney evaluation - Urine ACR  Never done   COVID-19  Vaccine (6 - Pfizer risk 2024-25 season) 05/11/2023   INFLUENZA VACCINE  08/26/2023   HEMOGLOBIN A1C  12/22/2023   OPHTHALMOLOGY EXAM  02/03/2024   Diabetic kidney evaluation - eGFR measurement  06/20/2024   DTaP/Tdap/Td (2 - Td or Tdap) 07/28/2024   Medicare Annual Wellness (AWV)  08/28/2024   DEXA SCAN  11/10/2024   Pneumococcal Vaccine: 50+ Years  Completed   Hepatitis C Screening  Completed   Hepatitis B Vaccines  Aged Out   HPV VACCINES  Aged Out   Meningococcal B Vaccine  Aged Out   Colonoscopy  Discontinued   Zoster Vaccines- Shingrix  Discontinued    Health Maintenance  Health Maintenance Due  Topic Date Due   FOOT EXAM  Never done   Diabetic kidney evaluation - Urine ACR  Never done   COVID-19 Vaccine (6 - Pfizer risk 2024-25 season) 05/11/2023   INFLUENZA VACCINE  08/26/2023   Health Maintenance Items Addressed: 08/29/2023   Additional Screening:  Vision Screening: Recommended annual ophthalmology exams for early detection of glaucoma and other disorders of the eye. Would you like a referral to an  eye doctor? No    Dental Screening: Recommended annual dental exams for proper oral hygiene  Community Resource Referral / Chronic Care Management: CRR required this visit?  No   CCM required this visit?  No   Plan:    I have personally reviewed and noted the following in the patient's chart:   Medical and social history Use of alcohol, tobacco or illicit drugs  Current medications and supplements including opioid prescriptions. Patient is not currently taking opioid prescriptions. Functional ability and status Nutritional status Physical activity Advanced directives List of other physicians Hospitalizations, surgeries, and ER visits in previous 12 months Vitals Screenings to include cognitive, depression, and falls Referrals and appointments  In addition, I have reviewed and discussed with patient certain preventive protocols, quality metrics, and best  practice recommendations. A written personalized care plan for preventive services as well as general preventive health recommendations were provided to patient.   Verdie CHRISTELLA Saba, CMA   08/29/2023   After Visit Summary: (MyChart) Due to this being a telephonic visit, the after visit summary with patients personalized plan was offered to patient via MyChart   Notes: Nothing significant to report at this time.

## 2023-08-29 NOTE — Patient Instructions (Signed)
 Kristina Ayala , Thank you for taking time out of your busy schedule to complete your Annual Wellness Visit with me. I enjoyed our conversation and look forward to speaking with you again next year. I, as well as your care team,  appreciate your ongoing commitment to your health goals. Please review the following plan we discussed and let me know if I can assist you in the future. Your Game plan/ To Do List    Referrals: If you haven't heard from the office you've been referred to, please reach out to them at the phone provided.   Follow up Visits: We will see or speak with you next year for your Next Medicare AWV with our clinical staff Have you seen your provider in the last 6 months (3 months if uncontrolled diabetes)? Yes  Clinician Recommendations:  Aim for 30 minutes of exercise or brisk walking, 6-8 glasses of water, and 5 servings of fruits and vegetables each day.       This is a list of the screenings recommended for you:  Health Maintenance  Topic Date Due   Complete foot exam   Never done   Yearly kidney health urinalysis for diabetes  Never done   COVID-19 Vaccine (6 - Pfizer risk 2024-25 season) 05/11/2023   Flu Shot  08/26/2023   Hemoglobin A1C  12/22/2023   Eye exam for diabetics  02/03/2024   Yearly kidney function blood test for diabetes  06/20/2024   DTaP/Tdap/Td vaccine (2 - Td or Tdap) 07/28/2024   Medicare Annual Wellness Visit  08/28/2024   DEXA scan (bone density measurement)  11/10/2024   Pneumococcal Vaccine for age over 49  Completed   Hepatitis C Screening  Completed   Hepatitis B Vaccine  Aged Out   HPV Vaccine  Aged Out   Meningitis B Vaccine  Aged Out   Colon Cancer Screening  Discontinued   Zoster (Shingles) Vaccine  Discontinued    Advanced directives: (Declined) Advance directive discussed with you today. Even though you declined this today, please call our office should you change your mind, and we can give you the proper paperwork for you to fill  out. Advance Care Planning is important because it:  [x]  Makes sure you receive the medical care that is consistent with your values, goals, and preferences  [x]  It provides guidance to your family and loved ones and reduces their decisional burden about whether or not they are making the right decisions based on your wishes.  Follow the link provided in your after visit summary or read over the paperwork we have mailed to you to help you started getting your Advance Directives in place. If you need assistance in completing these, please reach out to us  so that we can help you!

## 2023-08-30 ENCOUNTER — Ambulatory Visit: Payer: Self-pay | Admitting: *Deleted

## 2023-08-30 ENCOUNTER — Ambulatory Visit: Attending: Family Medicine | Admitting: Physical Therapy

## 2023-08-30 ENCOUNTER — Encounter: Payer: Self-pay | Admitting: Internal Medicine

## 2023-08-30 ENCOUNTER — Encounter: Payer: Self-pay | Admitting: Physical Therapy

## 2023-08-30 ENCOUNTER — Other Ambulatory Visit: Payer: Self-pay

## 2023-08-30 DIAGNOSIS — R262 Difficulty in walking, not elsewhere classified: Secondary | ICD-10-CM | POA: Insufficient documentation

## 2023-08-30 DIAGNOSIS — R269 Unspecified abnormalities of gait and mobility: Secondary | ICD-10-CM | POA: Insufficient documentation

## 2023-08-30 DIAGNOSIS — R2689 Other abnormalities of gait and mobility: Secondary | ICD-10-CM | POA: Insufficient documentation

## 2023-08-30 DIAGNOSIS — R296 Repeated falls: Secondary | ICD-10-CM | POA: Insufficient documentation

## 2023-08-30 DIAGNOSIS — R2681 Unsteadiness on feet: Secondary | ICD-10-CM | POA: Insufficient documentation

## 2023-08-30 DIAGNOSIS — R279 Unspecified lack of coordination: Secondary | ICD-10-CM | POA: Diagnosis not present

## 2023-08-30 DIAGNOSIS — M6281 Muscle weakness (generalized): Secondary | ICD-10-CM | POA: Diagnosis not present

## 2023-08-30 NOTE — Therapy (Signed)
 OUTPATIENT PHYSICAL THERAPY LOWER EXTREMITY EVALUATION   Patient Name: Kristina Ayala MRN: [id number] DOB:1945/06/20, 78 y.o., female Today's Date: 08/30/2023  END OF SESSION:  PT End of Session - 08/30/23 0957     Visit Number 1    Number of Visits 17    Date for PT Re-Evaluation 10/25/23    Authorization Type [employer] Dual    Authorization Time Period 08/30/23 to 10/25/23    PT Start Time 0930    PT Stop Time 1012    PT Time Calculation (min) 42 min    Activity Tolerance Patient tolerated treatment well    Behavior During Therapy WFL for tasks assessed/performed          Past Medical History:  Diagnosis Date   Arthritis    Bursitis    left elbow   Clostridium difficile infection    Diabetes mellitus without complication (HCC)    Frequent falls    HLD (hyperlipidemia)    Hypertension    Infectious colitis    Past Surgical History:  Procedure Laterality Date   CATARACT EXTRACTION Bilateral    COLONOSCOPY     KNEE SURGERY  2014   ORIF PROXIMAL TIBIAL PLATEAU FRACTURE Left 02/19/2012   knee   POLYPECTOMY     TONSILLECTOMY     Patient Active Problem List   Diagnosis Date Noted   Pain and swelling of left knee 06/21/2023   Sprain of left wrist 03/04/2023   Cold intolerance 09/09/2022   Gait abnormality 09/09/2022   Urge incontinence 05/13/2022   Right ovarian cyst 05/13/2022   Tibialis posterior tendinitis, right 04/29/2022   Palpitations 04/12/2022   Chronic fatigue 03/27/2021   Excessive daytime sleepiness 11/03/2020   Coronary artery calcification seen on CAT scan 09/09/2020   Aortic atherosclerosis (HCC) 08/31/2020   Right rib fracture 07/10/2020   Closed low lateral malleolus fracture 07/10/2020   Snoring 05/23/2020   Cough 12/04/2019   Pancreatic mass 11/08/2019   DOE (dyspnea on exertion) 03/20/2019   Chronic left SI joint pain 03/13/2019   Osteoarthritis 01/10/2019   Adnexal mass, right 11/09/2018   Pulmonary nodules 06/19/2018   Thyromegaly  10/28/2017   Restless leg syndrome 10/12/2017   Greater trochanteric bursitis, right 07/26/2017   Hypokalemia 04/18/2017   Degenerative arthritis of left knee 12/01/2016   Anxiety 12/01/2016   Unintentional weight loss 12/01/2016   Diabetes (HCC) 09/16/2016   Bursitis of right shoulder 09/04/2015   Lateral meniscus derangement 06/03/2015   Tear of LCL (lateral collateral ligament) of knee 06/03/2015   Lumbar radiculopathy 09/19/2014   Back pain 01/04/2014   Bilateral knee pain 01/04/2014   Diverticulosis of colon without hemorrhage 07/26/2013   Trigger thumb of left hand 03/06/2013   Essential hypertension, benign 02/15/2013   Hyperlipidemia 09/28/2012   COPD (chronic obstructive pulmonary disease) (HCC) 05/22/2012   Osteoporosis - on prolia  05/22/2012    PCP: Geofm Hastings MD   REFERRING PROVIDER: Claudene Arthea HERO, DO  REFERRING DIAG: R26.9 (ICD-10-CM) - Gait abnormality R26.89 (ICD-10-CM) - Balance problem  THERAPY DIAG:  Unsteadiness on feet  Repeated falls  Muscle weakness (generalized)  Difficulty in walking, not elsewhere classified  Rationale for Evaluation and Treatment: Rehabilitation  ONSET DATE: 3 weeks ago   SUBJECTIVE:   SUBJECTIVE STATEMENT:  I was sweeping the porch after landscapers come through, went to sit on a chair with a cushion and cushion slipped off/I fell forward and hit my head on the rail. Had severe HA and neck pain but  no injuries/broken bones and they didn't find any brain bleeds. Told me I had a mild concussion and that was it, still having issues with memory/word finding.   PERTINENT HISTORY: See above  PAIN:  Are you having pain? No 0/10  PRECAUTIONS: Fall  RED FLAGS: None   WEIGHT BEARING RESTRICTIONS: No  FALLS:  Has patient fallen in last 6 months? Yes. 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  DIAGNOSTIC FINDINGS:   Patient with fall.  Hit her head yesterday.  Due to age is high risk.  Will get head CT and cervical spine CT.  No other injury.   Differential diagnosis includes minor head injury, intracranial hemorrhage, cervical spine fracture.   Head CT and cervical spine CT reassuring.  No definite injury.  PATIENT SURVEYS:  PSFS: THE PATIENT SPECIFIC FUNCTIONAL SCALE  Place score of 0-10 (0 = unable to perform activity and 10 = able to perform activity at the same level as before injury or problem)  Activity (due to balance impairment) Date: 08/30/23    Carrying groceries  1    2. Standing for >15 minutes  0    3. Walking  3    4.      Total Score 1.33      Total Score = Sum of activity scores/number of activities  Minimally Detectable Change: 3 points (for single activity); 2 points (for average score)  [facility] (nd). The Patient Specific Functional Scale . Retrieved from [email or web address]   COGNITION: Overall cognitive status: Within functional limits for tasks assessed     SENSATION: Not tested      LOWER EXTREMITY MMT:  MMT Right eval Left eval  Hip flexion 3+ 3+  Hip extension    Hip abduction 3 3  Hip adduction    Hip internal rotation    Hip external rotation    Knee flexion 4 4+  Knee extension 4 4+  Ankle dorsiflexion 4+ 4+  Ankle plantarflexion    Ankle inversion    Ankle eversion     (Blank rows = not tested)    FUNCTIONAL TESTS:  5  times sit to stand: 30.6 seconds intermittent use of BUEs  3 minute walk test: 532ft no device min guard     08/30/23 0001  Standardized Balance Assessment  Standardized Balance Assessment Dynamic Gait Index  Dynamic Gait Index  Level Surface 2  Change in Gait Speed 2  Gait with Horizontal Head Turns 1  Gait with Vertical Head Turns 2  Gait and Pivot Turn 1  Step Over Obstacle 2  Step Around Obstacles 1  Steps 0  Total Score 11      GAIT: Distance walked: 522ft Assistive device utilized: None Level of assistance: CGA Comments: slow and unsteady especially on corners, difficult clearing L LE even tho R was weaker, limited heel toe pattern, min guard for safety  TREATMENT DATE:   08/30/23  Eval, POC, HEP practice and education as below     PATIENT EDUCATION:  Education details: exam findings, POC, HEP  Person educated: Patient Education method: Explanation, Demonstration, and Handouts Education comprehension: verbalized understanding, returned demonstration, and needs further education  HOME EXERCISE PROGRAM:  Access Code: [email or web address] URL: [email or web address] Date: 08/30/2023 Prepared by: Josette Rough  Exercises - Supine Bridge with Resistance Band  - 1 x daily - 7 x weekly - 2 sets - 10 reps - Clamshell with Resistance  - 1 x daily - 7 x weekly - 2 sets - 10 reps - Sit to Stand with Resistance Around Legs  - 1 x daily - 7 x weekly - 2 sets - 10 reps  ASSESSMENT:  CLINICAL IMPRESSION: Patient is a 78 y.o. F who was seen today for physical therapy evaluation and treatment for R26.9 (ICD-10-CM) - Gait abnormality R26.89 (ICD-10-CM) - Balance problem. She is an extremely high fall risk at this time as evidenced by objectives above and 7 falls in the past six months. Will make every effort to reduce fall risk and other  functional concerns moving forward.    OBJECTIVE IMPAIRMENTS: Abnormal gait, decreased activity tolerance, decreased balance, decreased coordination, decreased knowledge of use of DME, decreased mobility, difficulty walking, decreased strength, and impaired perceived functional ability.   ACTIVITY LIMITATIONS: standing, squatting, stairs, transfers, and locomotion level  PARTICIPATION LIMITATIONS: cleaning, laundry, driving, shopping, community activity, yard work, and church  PERSONAL FACTORS: Age, Behavior pattern, Education, Fitness, Past/current experiences, Social background, and Time since onset of injury/illness/exacerbation are also affecting patient's functional outcome.   REHAB POTENTIAL: Fair chronicity of balance issues, recurrent falls   CLINICAL DECISION MAKING: Stable/uncomplicated  EVALUATION COMPLEXITY: Low   GOALS: Goals reviewed with patient? No  SHORT TERM GOALS: Target date: 09/27/2023   Will be compliant with appropriate progressive HEP  Baseline: Goal status: INITIAL  2.  Will name 3 ways to reduce fall risk at home and in the community  Baseline:  Goal status: INITIAL  3.  Will finish 5xSTS in 15 seconds or less to show improved mobility/strength  Baseline:  Goal status: INITIAL    LONG TERM GOALS:  10/25/2023      MMT to have improved by one grade all weak groups  Baseline:  Goal status: INITIAL  2.  Will score at least 18 on DGI to show reduced fall risk  Baseline:  Goal status: INITIAL  3.  Will be able to perform steps with no rails and distant S Baseline:  Goal status: INITIAL  4.  Will complete with no increased fatigue or unsteadiness  Baseline:  Goal status: INITIAL  5.  PSFS to have improved by at least 3 points  Baseline:  Goal status: INITIAL     PLAN:  PT FREQUENCY: 2x/week  PT DURATION: 8 weeks  PLANNED INTERVENTIONS: 97750- Physical Performance Testing, 97110-Therapeutic exercises, 97530- Therapeutic  activity, [id number]- Neuromuscular re-education, 97535- Self Care, [id number]- Manual therapy, and 97116- Gait training  PLAN FOR NEXT SESSION: strength and balance, functional activity tolerance, encourage LRAD   Josette Rough, PT, DPT 08/30/23 10:14 AM

## 2023-08-30 NOTE — Telephone Encounter (Signed)
  FYI Only or Action Required?: Action required by provider: request for appointment.  Patient was last seen in primary care on 08/23/2023 by Geofm Glade PARAS, MD.  Called Nurse Triage reporting Generalized Body Aches.  Symptoms began several days ago.  Interventions attempted: Rest, hydration, or home remedies.  Symptoms are: gradually worsening.  Triage Disposition: See PCP When Office is Open (Within 3 Days)  Patient/caregiver understands and will follow disposition?: Yes                Copied from CRM #[id number]. Topic: Clinical - Red Word Triage >> Aug 30, 2023  3:45 PM [place] E wrote: [facility] that prompted transfer to Nurse Triage: Body pains. Patient stated she has low energy, body aches and pains, and no appetite. Going on for 2 days. Reason for Disposition  [1] MODERATE pain (e.g., interferes with normal activities) AND [2] present > 3 days  Answer Assessment - Initial Assessment Questions Appt scheduled for tomorrow with PCP. Patient requesting if any OTC medications can help with sx. Recommended ibuprofen  and to eating small snacks/ meals if possible.       1. ONSET: When did the muscle aches or body pains start?      Sunday  2. LOCATION: What part of your body is hurting? (e.g., entire body, arms, legs)      Body aches, no energy, chills/cold 3. SEVERITY: How bad is the pain? (Scale 1-10; or mild, moderate, severe)     Pain 10/10 4. CAUSE: What do you think is causing the pains?     Not sure  5. FEVER: Do you have a fever? If Yes, ask: What is your temperature, how was it measured, and  when did it start?      na 6. OTHER SYMPTOMS: Do you have any other symptoms? (e.g., chest pain, cold or flu symptoms, rash, weakness, weight loss)     Body pain, no energy, no appetite chills,  feels cold headaches 7. PREGNANCY: Is there any chance you are pregnant? When was your last menstrual period?     na 8. TRAVEL: Have you traveled out of  the country in the last month? (e.g., exposures, travel history)     na  Protocols used: Muscle Aches and Body Pain-A-AH

## 2023-08-30 NOTE — Progress Notes (Signed)
   08/30/23 0001  Standardized Balance Assessment  Standardized Balance Assessment Dynamic Gait Index  Dynamic Gait Index  Level Surface 2  Change in Gait Speed 2  Gait with Horizontal Head Turns 1  Gait with Vertical Head Turns 2  Gait and Pivot Turn 1  Step Over Obstacle 2  Step Around Obstacles 1  Steps 0  Total Score 11

## 2023-08-30 NOTE — Progress Notes (Unsigned)
    Subjective:    Patient ID: Anson DELENA Fletcher, female    DOB: May 23, 1945, 78 y.o.   MRN: [id number]      HPI Detra is here for No chief complaint on file.   Body aches, pains, no energy, no appetite, chills     Medications and allergies reviewed with patient and updated if appropriate.  Current Outpatient Medications on File Prior to Visit  Medication Sig Dispense Refill   albuterol  (VENTOLIN  HFA) 108 (90 Base) MCG/ACT inhaler SMARTSIG:1-2 Puff(s) By Mouth Every 6 Hours PRN     ALPRAZolam  (XANAX ) 0.5 MG tablet TAKE 1 TABLET(0.5 MG) BY MOUTH EVERY DAY 30 tablet 4   amLODipine  (NORVASC ) 10 MG tablet      atorvastatin  (LIPITOR) 40 MG tablet TAKE 1 TABLET(40 MG) BY MOUTH DAILY. FOLLOW-UP APPOINTMENT WITH LABS ARE DUE 90 tablet 1   cholecalciferol (VITAMIN D ) 25 MCG (1000 UNIT) tablet Take 2,000 Units by mouth daily.     Cyanocobalamin  (VITAMIN B-12) 2500 MCG SUBL Place 2,500 mcg under the tongue daily.     denosumab  (PROLIA ) 60 MG/ML SOSY injection INJECT 60 UNITS UNDER THE SKIN AS DIRECTED 1 mL 0   diclofenac  Sodium (VOLTAREN ) 1 % GEL Apply 2 g topically 4 (four) times daily. To affected joint. 100 g 11   fluticasone  (FLONASE ) 50 MCG/ACT nasal spray Place 2 sprays into both nostrils daily. 16 g 6   gabapentin  (NEURONTIN ) 100 MG capsule Take 1 capsule (100 mg total) by mouth at bedtime as needed. 100 capsule 2   hydrochlorothiazide  (HYDRODIURIL ) 25 MG tablet TAKE 1 TABLET(25 MG) BY MOUTH EVERY MORNING 90 tablet 3   hydrocortisone 2.5 % cream Apply topically.     lisinopril  (ZESTRIL ) 40 MG tablet TAKE 1 TABLET(40 MG) BY MOUTH DAILY 90 tablet 2   MAGNESIUM  PO Take by mouth.     meloxicam  (MOBIC ) 7.5 MG tablet Take 1 tablet (7.5 mg total) by mouth daily as needed for pain take with food 30 tablet 0   potassium chloride  (KLOR-CON ) 10 MEQ tablet TAKE 1 TABLET(10 MEQ) BY MOUTH DAILY 30 tablet 3   RETIN-A 0.05 % cream Apply topically at bedtime.     TURMERIC PO Take 1 tablet by  mouth daily.     Current Facility-Administered Medications on File Prior to Visit  Medication Dose Route Frequency Provider Last Rate Last Admin   [START ON 12/07/2023] denosumab  (PROLIA ) injection 60 mg  60 mg Subcutaneous Once Geofm Glade PARAS, MD        Review of Systems     Objective:  There were no vitals filed for this visit. BP Readings from Last 3 Encounters:  08/23/23 118/68  08/11/23 (!) 148/82  07/21/23 (!) 164/89   Wt Readings from Last 3 Encounters:  08/29/23 154 lb (69.9 kg)  08/23/23 154 lb (69.9 kg)  07/06/23 156 lb (70.8 kg)   There is no height or weight on file to calculate BMI.    Physical Exam         Assessment & Plan:    See Problem List for Assessment and Plan of chronic medical problems.

## 2023-08-31 ENCOUNTER — Encounter: Payer: Self-pay | Admitting: Internal Medicine

## 2023-08-31 ENCOUNTER — Other Ambulatory Visit (HOSPITAL_COMMUNITY): Payer: Self-pay

## 2023-08-31 ENCOUNTER — Other Ambulatory Visit: Payer: Self-pay | Admitting: Internal Medicine

## 2023-08-31 ENCOUNTER — Ambulatory Visit (INDEPENDENT_AMBULATORY_CARE_PROVIDER_SITE_OTHER): Admitting: Internal Medicine

## 2023-08-31 VITALS — BP 130/80 | HR 84 | Temp 98.6°F | Ht 64.0 in | Wt 158.0 lb

## 2023-08-31 DIAGNOSIS — R5383 Other fatigue: Secondary | ICD-10-CM | POA: Insufficient documentation

## 2023-08-31 DIAGNOSIS — I10 Essential (primary) hypertension: Secondary | ICD-10-CM

## 2023-08-31 DIAGNOSIS — M791 Myalgia, unspecified site: Secondary | ICD-10-CM | POA: Diagnosis not present

## 2023-08-31 DIAGNOSIS — M2550 Pain in unspecified joint: Secondary | ICD-10-CM | POA: Diagnosis not present

## 2023-08-31 LAB — POC COVID19 BINAXNOW: SARS Coronavirus 2 Ag: NEGATIVE

## 2023-08-31 NOTE — Assessment & Plan Note (Signed)
 Acute Sudden onset Having increased joint pain in addition to arthralgias, chills, fatigue, decreased appetite, headaches, runny nose She states it was sudden onset-sounds viral in nature COVID test negative Will check CBC, CMP, TSH, ESR, CRP, ANA-will get blood work done tomorrow Symptomatic treatment

## 2023-08-31 NOTE — Patient Instructions (Addendum)
     Your test for covid was negative.     Blood work was ordered.   You can have this done tomorrow.     Medications changes include :   None - take over the counter medications for symptom relief.       Return if symptoms worsen or fail to improve.

## 2023-08-31 NOTE — Progress Notes (Unsigned)
 [name] [address] [place] [address] Phone: [phone] Subjective:   LILLETTE Berwyn Posey, am serving as a scribe for Dr. Arthea Claudene.  I'm seeing this patient by the request  of:  Geofm Glade PARAS, MD  CC: Left knee and lower back pain  YEP:Dlagzrupcz  07/06/2023 Significant arthritic changes, has been responded to injections by us  as well as by another provider. Was given more of a long-acting steroid and feels like this has been more beneficial. Hopefully will continue. Still wants to avoid surgical intervention.   Discussed icing regimen and home exercises.  Discussed worsening pain to get the epidural that she has responded to previously and she is scheduled at the moment on June 26.  Increase activity slowly.  Follow-up again in 6 to 8 weeks.      Update 09/01/2023 Marki A Chronister is a 78 y.o. female coming in with complaint of L knee and lower back pain. Patient did suffer a fall since last visit and went into the ED. Patient states that she is going to PT and they will start working on strength with her. Patient said injection has worn off in L leg.   Also notes weakness in the R leg. Denies any lumbar spine pain recently.   Going to have labwork today that Dr. Geofm placed yesterday. Patient is worried she has Legionnaire disease.      Past Medical History:  Diagnosis Date   Arthritis    Bursitis    left elbow   Clostridium difficile infection    Diabetes mellitus without complication (HCC)    Frequent falls    HLD (hyperlipidemia)    Hypertension    Infectious colitis    Past Surgical History:  Procedure Laterality Date   CATARACT EXTRACTION Bilateral    COLONOSCOPY     KNEE SURGERY  2014   ORIF PROXIMAL TIBIAL PLATEAU FRACTURE Left 02/19/2012   knee   POLYPECTOMY     TONSILLECTOMY     Social History   Socioeconomic History   Marital status: Divorced    Spouse name: Not on file   Number of children: 2   Years  of education: Not on file   Highest education level: Some college, no degree  Occupational History   Occupation: RETIRED  Tobacco Use   Smoking status: Former    Current packs/day: 0.00    Average packs/day: 0.3 packs/day for 50.0 years (12.5 ttl pk-yrs)    Types: Cigarettes    Start date: 08/27/1964    Quit date: 08/28/2014    Years since quitting: 9.0   Smokeless tobacco: Never   Tobacco comments:    1 pack every 3 days  Vaping Use   Vaping status: Never Used  Substance and Sexual Activity   Alcohol use: No    Alcohol/week: 0.0 standard drinks of alcohol   Drug use: No   Sexual activity: Not Currently  Other Topics Concern   Not on file  Social History Narrative   Lives alone    Right handed   Caffeine: 3x weekly         Social Drivers of Health   Financial Resource Strain: Low Risk  (08/29/2023)   Overall Financial Resource Strain (CARDIA)    Difficulty of Paying Living Expenses: Not hard at all  Food Insecurity: No Food Insecurity (08/29/2023)   Hunger Vital Sign    Worried About Running Out of Food in the Last Year: Never true  Ran Out of Food in the Last Year: Never true  Transportation Needs: No Transportation Needs (08/29/2023)   PRAPARE - Administrator, Civil Service (Medical): No    Lack of Transportation (Non-Medical): No  Physical Activity: Insufficiently Active (08/29/2023)   Exercise Vital Sign    Days of Exercise per Week: 5 days    Minutes of Exercise per Session: 10 min  Stress: No Stress Concern Present (08/29/2023)   [employer] of Occupational Health - Occupational Stress Questionnaire    Feeling of Stress: Not at all  Social Connections: Moderately Integrated (08/29/2023)   Social Connection and Isolation Panel    Frequency of Communication with Friends and Family: More than three times a week    Frequency of Social Gatherings with Friends and Family: Three times a week    Attends Religious Services: More than 4 times per year    Active  Member of Clubs or Organizations: Yes    Attends Banker Meetings: More than 4 times per year    Marital Status: Divorced   Allergies  Allergen Reactions   Doxycycline Diarrhea   Minocycline Diarrhea   Wellbutrin  [Bupropion ] Swelling    Per pt her tongue was swollen and sore/symptoms stopped once the medication was discontinued.    Lactose Intolerance (Gi) Diarrhea   Cymbalta  [Duloxetine  Hcl] Other (See Comments)    Disorientated, dizzy, loss of appetite, stomach discomfort   Penicillins Rash    Has patient had a PCN reaction causing immediate rash, facial/tongue/throat swelling, SOB or lightheadedness with hypotension: no Has patient had a PCN reaction causing severe rash involving mucus membranes or skin necrosis: no Has patient had a PCN reaction that required hospitalization: unknown Has patient had a PCN reaction occurring within the last 10 years: no If all of the above answers are NO, then may proceed with Cephalosporin use.    Z-Pak [Azithromycin ] Other (See Comments)    diarrhea   Family History  Problem Relation Age of Onset   Diverticulosis Mother    Ovarian cancer Mother    Pancreatic cancer Father    Lung cancer Father    Colon polyps Sister    Heart failure Daughter    Diabetes Son    Colon cancer Neg Hx    Breast cancer Neg Hx    Esophageal cancer Neg Hx    Stomach cancer Neg Hx     Current Outpatient Medications (Endocrine & Metabolic):    denosumab  (PROLIA ) 60 MG/ML SOSY injection, INJECT 60 UNITS UNDER THE SKIN AS DIRECTED  Current Facility-Administered Medications (Endocrine & Metabolic):    [START ON 12/07/2023] denosumab  (PROLIA ) injection 60 mg  Current Outpatient Medications (Cardiovascular):    amLODipine  (NORVASC ) 10 MG tablet,    atorvastatin  (LIPITOR) 40 MG tablet, TAKE 1 TABLET(40 MG) BY MOUTH DAILY. FOLLOW-UP APPOINTMENT WITH LABS ARE DUE   hydrochlorothiazide  (HYDRODIURIL ) 25 MG tablet, TAKE 1 TABLET(25 MG) BY MOUTH EVERY  MORNING   lisinopril  (ZESTRIL ) 40 MG tablet, TAKE 1 TABLET(40 MG) BY MOUTH DAILY   Current Outpatient Medications (Respiratory):    albuterol  (VENTOLIN  HFA) 108 (90 Base) MCG/ACT inhaler, SMARTSIG:1-2 Puff(s) By Mouth Every 6 Hours PRN   fluticasone  (FLONASE ) 50 MCG/ACT nasal spray, Place 2 sprays into both nostrils daily.   Current Outpatient Medications (Analgesics):    meloxicam  (MOBIC ) 7.5 MG tablet, Take 1 tablet (7.5 mg total) by mouth daily as needed for pain take with food   Current Outpatient Medications (Hematological):    Cyanocobalamin  (VITAMIN  B-12) 2500 MCG SUBL, Place 2,500 mcg under the tongue daily.   Current Outpatient Medications (Other):    ALPRAZolam  (XANAX ) 0.5 MG tablet, TAKE 1 TABLET(0.5 MG) BY MOUTH EVERY DAY   cholecalciferol (VITAMIN D ) 25 MCG (1000 UNIT) tablet, Take 2,000 Units by mouth daily.   diclofenac  Sodium (VOLTAREN ) 1 % GEL, Apply 2 g topically 4 (four) times daily. To affected joint.   gabapentin  (NEURONTIN ) 100 MG capsule, Take 1 capsule (100 mg total) by mouth at bedtime as needed.   hydrocortisone 2.5 % cream, Apply topically.   MAGNESIUM  PO, Take by mouth.   potassium chloride  (KLOR-CON ) 10 MEQ tablet, TAKE 1 TABLET(10 MEQ) BY MOUTH DAILY   RETIN-A 0.05 % cream, Apply topically at bedtime.   TURMERIC PO, Take 1 tablet by mouth daily.    Reviewed prior external information including notes and imaging from  primary care provider As well as notes that were available from care everywhere and other healthcare systems.  Past medical history, social, surgical and family history all reviewed in electronic medical record.  No pertanent information unless stated regarding to the chief complaint.   Review of Systems:  No headache, visual changes, nausea, vomiting, diarrhea, constipation, dizziness, abdominal pain, skin rash, fevers, chills, night sweats, weight loss, swollen lymph nodes, body aches, joint swelling, chest pain, shortness of breath,  mood changes. POSITIVE muscle aches  Objective  Blood pressure 128/84, pulse 78, height 5' 4 (1.626 m), weight 159 lb (72.1 kg), SpO2 98%.   General: No apparent distress alert and oriented x3 mood and affect normal, dressed appropriately.  HEENT: Pupils equal, extraocular movements intact  Respiratory: Patient's speak in full sentences and does not appear short of breath  Cardiovascular: No lower extremity edema, non tender, no erythema  Left knee does have the arthritic changes noted, postsurgical changes noted as well.  Crepitus noted. Low back significant tightness on the left side lower back.  Voluntary guarding noted as well.  After informed written and verbal consent, patient was seated on exam table. Left knee was prepped with alcohol swab and utilizing anterolateral approach, patient's left knee space was injected with 4:1  marcaine 0.5%: Kenalog  40mg /dL. Patient tolerated the procedure well without immediate complications.    Impression and Recommendations:    The above documentation has been reviewed and is accurate and complete Rodnesha Elie M Davielle Lingelbach, DO

## 2023-08-31 NOTE — Assessment & Plan Note (Signed)
 Chronic Blood pressure controlled Continue amlodipine  10 mg at night, hydrochlorothiazide  25 mg, lisinopril  40 mg in the morning

## 2023-08-31 NOTE — Assessment & Plan Note (Signed)
 Acute  Having increased joint pain in addition to myalgias, chills, arthralgias, decreased appetite, headaches, runny nose She states it was sudden onset-sounds viral in nature COVID test negative Will check CBC, CMP, TSH, ESR, CRP, ANA-will get blood work done tomorrow Symptomatic treatment

## 2023-08-31 NOTE — Assessment & Plan Note (Signed)
 Acute on chronic Having increased joint pain in addition to myalgias, chills, fatigue, decreased appetite, headaches, runny nose She states it was sudden onset-sounds viral in nature COVID test negative Will check CBC, CMP, TSH, ESR, CRP, ANA-will get blood work done tomorrow Symptomatic treatment

## 2023-09-01 ENCOUNTER — Ambulatory Visit: Admitting: Physical Therapy

## 2023-09-01 ENCOUNTER — Encounter: Payer: Self-pay | Admitting: Family Medicine

## 2023-09-01 ENCOUNTER — Ambulatory Visit: Admitting: Family Medicine

## 2023-09-01 ENCOUNTER — Encounter: Payer: Self-pay | Admitting: Physical Therapy

## 2023-09-01 ENCOUNTER — Ambulatory Visit: Payer: Self-pay | Admitting: Internal Medicine

## 2023-09-01 VITALS — BP 128/84 | HR 78 | Ht 64.0 in | Wt 159.0 lb

## 2023-09-01 DIAGNOSIS — M2550 Pain in unspecified joint: Secondary | ICD-10-CM | POA: Diagnosis not present

## 2023-09-01 DIAGNOSIS — R279 Unspecified lack of coordination: Secondary | ICD-10-CM

## 2023-09-01 DIAGNOSIS — R5383 Other fatigue: Secondary | ICD-10-CM

## 2023-09-01 DIAGNOSIS — R2681 Unsteadiness on feet: Secondary | ICD-10-CM

## 2023-09-01 DIAGNOSIS — G8929 Other chronic pain: Secondary | ICD-10-CM

## 2023-09-01 DIAGNOSIS — M1732 Unilateral post-traumatic osteoarthritis, left knee: Secondary | ICD-10-CM

## 2023-09-01 DIAGNOSIS — M533 Sacrococcygeal disorders, not elsewhere classified: Secondary | ICD-10-CM | POA: Diagnosis not present

## 2023-09-01 DIAGNOSIS — R296 Repeated falls: Secondary | ICD-10-CM

## 2023-09-01 DIAGNOSIS — R262 Difficulty in walking, not elsewhere classified: Secondary | ICD-10-CM | POA: Diagnosis not present

## 2023-09-01 DIAGNOSIS — M791 Myalgia, unspecified site: Secondary | ICD-10-CM | POA: Diagnosis not present

## 2023-09-01 DIAGNOSIS — M6281 Muscle weakness (generalized): Secondary | ICD-10-CM

## 2023-09-01 DIAGNOSIS — E1169 Type 2 diabetes mellitus with other specified complication: Secondary | ICD-10-CM

## 2023-09-01 DIAGNOSIS — M5416 Radiculopathy, lumbar region: Secondary | ICD-10-CM | POA: Diagnosis not present

## 2023-09-01 DIAGNOSIS — R2689 Other abnormalities of gait and mobility: Secondary | ICD-10-CM | POA: Diagnosis not present

## 2023-09-01 DIAGNOSIS — R269 Unspecified abnormalities of gait and mobility: Secondary | ICD-10-CM | POA: Diagnosis not present

## 2023-09-01 LAB — CBC WITH DIFFERENTIAL/PLATELET
Basophils Absolute: 0.1 K/uL (ref 0.0–0.1)
Basophils Relative: 1.1 % (ref 0.0–3.0)
Eosinophils Absolute: 0.1 K/uL (ref 0.0–0.7)
Eosinophils Relative: 1.9 % (ref 0.0–5.0)
HCT: 40.1 % (ref 36.0–46.0)
Hemoglobin: 13.1 g/dL (ref 12.0–15.0)
Lymphocytes Relative: 42.8 % (ref 12.0–46.0)
Lymphs Abs: 2.2 K/uL (ref 0.7–4.0)
MCHC: 32.8 g/dL (ref 30.0–36.0)
MCV: 81.7 fl (ref 78.0–100.0)
Monocytes Absolute: 0.5 K/uL (ref 0.1–1.0)
Monocytes Relative: 9.2 % (ref 3.0–12.0)
Neutro Abs: 2.3 K/uL (ref 1.4–7.7)
Neutrophils Relative %: 45 % (ref 43.0–77.0)
Platelets: 276 K/uL (ref 150.0–400.0)
RBC: 4.9 Mil/uL (ref 3.87–5.11)
RDW: 15.5 % (ref 11.5–15.5)
WBC: 5 K/uL (ref 4.0–10.5)

## 2023-09-01 LAB — COMPREHENSIVE METABOLIC PANEL WITH GFR
ALT: 22 U/L (ref 0–35)
AST: 20 U/L (ref 0–37)
Albumin: 4.3 g/dL (ref 3.5–5.2)
Alkaline Phosphatase: 40 U/L (ref 39–117)
BUN: 15 mg/dL (ref 6–23)
CO2: 28 meq/L (ref 19–32)
Calcium: 9.6 mg/dL (ref 8.4–10.5)
Chloride: 104 meq/L (ref 96–112)
Creatinine, Ser: 0.69 mg/dL (ref 0.40–1.20)
GFR: 82.52 mL/min (ref >60.00)
Glucose, Bld: 85 mg/dL (ref 70–99)
Potassium: 4.2 meq/L (ref 3.5–5.1)
Sodium: 144 meq/L (ref 135–145)
Total Bilirubin: 0.4 mg/dL (ref 0.2–1.2)
Total Protein: 6.9 g/dL (ref 6.0–8.3)

## 2023-09-01 LAB — C-REACTIVE PROTEIN: CRP: <1 mg/dL (ref 0.5–20.0)

## 2023-09-01 LAB — SEDIMENTATION RATE: Sed Rate: 28 mm/h (ref 0–30)

## 2023-09-01 LAB — TSH: TSH: 0.59 u[IU]/mL (ref 0.35–5.50)

## 2023-09-01 MED ORDER — METHYLPREDNISOLONE ACETATE 40 MG/ML IJ SUSP
40.0000 mg | Freq: Once | INTRAMUSCULAR | Status: AC
  Administered 2023-09-01: 40 mg via INTRAMUSCULAR

## 2023-09-01 MED ORDER — KETOROLAC TROMETHAMINE 30 MG/ML IJ SOLN
30.0000 mg | Freq: Once | INTRAMUSCULAR | Status: AC
  Administered 2023-09-01: 30 mg via INTRAMUSCULAR

## 2023-09-01 NOTE — Therapy (Signed)
 OUTPATIENT PHYSICAL THERAPY LOWER EXTREMITY EVALUATION   Patient Name: Kristina Ayala MRN: [id number] DOB:1945-08-01, 78 y.o., female Today's Date: 09/01/2023  END OF SESSION:  PT End of Session - 09/01/23 1338     Visit Number 2    Date for PT Re-Evaluation 10/25/23    PT Start Time 1340    PT Stop Time 1425    PT Time Calculation (min) 45 min    Activity Tolerance Patient tolerated treatment well    Behavior During Therapy WFL for tasks assessed/performed          Past Medical History:  Diagnosis Date   Arthritis    Bursitis    left elbow   Clostridium difficile infection    Diabetes mellitus without complication (HCC)    Frequent falls    HLD (hyperlipidemia)    Hypertension    Infectious colitis    Past Surgical History:  Procedure Laterality Date   CATARACT EXTRACTION Bilateral    COLONOSCOPY     KNEE SURGERY  2014   ORIF PROXIMAL TIBIAL PLATEAU FRACTURE Left 02/19/2012   knee   POLYPECTOMY     TONSILLECTOMY     Patient Active Problem List   Diagnosis Date Noted   Fatigue 08/31/2023   Myalgia 08/31/2023   Arthralgia 08/31/2023   Pain and swelling of left knee 06/21/2023   Sprain of left wrist 03/04/2023   Cold intolerance 09/09/2022   Gait abnormality 09/09/2022   Urge incontinence 05/13/2022   Right ovarian cyst 05/13/2022   Tibialis posterior tendinitis, right 04/29/2022   Palpitations 04/12/2022   Chronic fatigue 03/27/2021   Excessive daytime sleepiness 11/03/2020   Coronary artery calcification seen on CAT scan 09/09/2020   Aortic atherosclerosis (HCC) 08/31/2020   Right rib fracture 07/10/2020   Closed low lateral malleolus fracture 07/10/2020   Snoring 05/23/2020   Cough 12/04/2019   Pancreatic mass 11/08/2019   DOE (dyspnea on exertion) 03/20/2019   Chronic left SI joint pain 03/13/2019   Osteoarthritis 01/10/2019   Adnexal mass, right 11/09/2018   Pulmonary nodules 06/19/2018   Multinodular thyroid - FU  US  due 08/2024 10/28/2017    Restless leg syndrome 10/12/2017   Greater trochanteric bursitis, right 07/26/2017   Hypokalemia 04/18/2017   Degenerative arthritis of left knee 12/01/2016   Anxiety 12/01/2016   Unintentional weight loss 12/01/2016   Diabetes (HCC) 09/16/2016   Bursitis of right shoulder 09/04/2015   Lateral meniscus derangement 06/03/2015   Tear of LCL (lateral collateral ligament) of knee 06/03/2015   Lumbar radiculopathy 09/19/2014   Back pain 01/04/2014   Bilateral knee pain 01/04/2014   Diverticulosis of colon without hemorrhage 07/26/2013   Trigger thumb of left hand 03/06/2013   Essential hypertension, benign 02/15/2013   Hyperlipidemia 09/28/2012   COPD (chronic obstructive pulmonary disease) (HCC) 05/22/2012   Osteoporosis - on prolia  05/22/2012    PCP: Geofm Hastings MD   REFERRING PROVIDER: Claudene Arthea HERO, DO  REFERRING DIAG: R26.9 (ICD-10-CM) - Gait abnormality R26.89 (ICD-10-CM) - Balance problem  THERAPY DIAG:  Unsteadiness on feet  Repeated falls  Muscle weakness (generalized)  Difficulty in walking, not elsewhere classified  Unspecified lack of coordination  Rationale for Evaluation and Treatment: Rehabilitation  ONSET DATE: 3 weeks ago   SUBJECTIVE:   SUBJECTIVE STATEMENT: Im just out of breath, been running around all morning   I was sweeping the porch after landscapers come through, went to sit on a chair with a cushion and cushion slipped off/I fell forward and hit my  head on the rail. Had severe HA and neck pain but no injuries/broken bones and they didn't find any brain bleeds. Told me I had a mild concussion and that was it, still having issues with memory/word finding.   PERTINENT HISTORY: See above  PAIN:  Are you having pain? No 0/10  PRECAUTIONS: Fall  RED FLAGS: None   WEIGHT BEARING RESTRICTIONS: No  FALLS:  Has patient fallen in last 6 months? Yes. 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  DIAGNOSTIC FINDINGS:   Patient with fall.  Hit her head yesterday.  Due to age is high risk.  Will get head CT and cervical spine CT.  No other injury.   Differential diagnosis includes minor head injury, intracranial hemorrhage, cervical spine fracture.   Head CT and cervical spine CT reassuring.  No definite injury.  PATIENT SURVEYS:  PSFS: THE PATIENT SPECIFIC FUNCTIONAL SCALE  Place score of 0-10 (0 = unable to perform activity and 10 = able to perform activity at the same level as before injury or problem)  Activity (due to balance impairment) Date: 08/30/23    Carrying groceries  1    2. Standing for >15 minutes  0    3. Walking  3    4.      Total Score 1.33      Total Score = Sum of activity scores/number of activities  Minimally Detectable Change: 3 points (for single activity); 2 points (for average score)  [facility] (nd). The Patient Specific Functional Scale . Retrieved from [email or web address]   COGNITION: Overall cognitive status: Within functional limits for tasks assessed     SENSATION: Not tested      LOWER EXTREMITY MMT:  MMT Right eval Left eval  Hip flexion 3+ 3+  Hip extension    Hip abduction 3 3  Hip adduction    Hip internal rotation    Hip external rotation    Knee flexion 4 4+  Knee extension 4 4+  Ankle dorsiflexion 4+ 4+  Ankle plantarflexion    Ankle  inversion    Ankle eversion     (Blank rows = not tested)    FUNCTIONAL TESTS:  5 times sit to stand: 30.6 seconds intermittent use of BUEs  3 minute walk test: 571ft no device min guard     08/30/23 0001  Standardized Balance Assessment  Standardized Balance Assessment Dynamic Gait Index  Dynamic Gait Index  Level Surface 2  Change in Gait Speed 2  Gait with Horizontal Head Turns 1  Gait with Vertical Head Turns 2  Gait and Pivot Turn 1  Step Over Obstacle 2  Step Around Obstacles 1  Steps 0  Total Score 11      GAIT: Distance walked: 550ft Assistive device utilized: None Level of assistance: CGA Comments: slow and unsteady especially on corners, difficult clearing L LE even tho R was weaker, limited heel toe pattern, min guard for safety  TREATMENT DATE:  09/01/23 NuStep L4 x 6 min HS curls 20lb 2x10 Leg Ext 5lb 2x10 Sit to stand fro elevated surface w/ progressive lowering 2x10 Alt 4in box taps 2x10  Backwards walking  Side steps  4in 1 rail step ups x10 Side steps in // on balance beam  08/30/23  Eval, POC, HEP practice and education as below     PATIENT EDUCATION:  Education details: exam findings, POC, HEP  Person educated: Patient Education method: Programmer, multimedia, Demonstration, and Handouts Education comprehension: verbalized understanding, returned demonstration, and needs further education  HOME EXERCISE PROGRAM:  Access Code: [email or web address] URL: [email or web address] Date: 08/30/2023 Prepared by: Josette Rough  Exercises - Supine Bridge with Resistance Band  - 1 x daily - 7 x weekly - 2 sets - 10 reps - Clamshell with Resistance  - 1 x daily - 7 x weekly - 2 sets - 10 reps - Sit to Stand with Resistance Around Legs  - 1 x daily - 7 x weekly - 2 sets - 10 reps  ASSESSMENT:  CLINICAL IMPRESSION: Patient is  a 78 y.o. F who was seen today for physical therapy evaluation and treatment for R26.9 (ICD-10-CM) - Gait abnormality R26.89 (ICD-10-CM) - Balance problem. Session consisted of LE strength and balance. Cues for eccentric control with leg extensions.  Pt was eventually able to stand form lowest mat setting with sit to stands. PR very unstable during first set of alt box taps and side steps on airex. Will make every effort to reduce fall risk and other functional concerns moving forward.    OBJECTIVE IMPAIRMENTS: Abnormal gait, decreased activity tolerance, decreased balance, decreased coordination, decreased knowledge of use of DME, decreased mobility, difficulty walking, decreased strength, and impaired perceived functional ability.   ACTIVITY LIMITATIONS: standing, squatting, stairs, transfers, and locomotion level  PARTICIPATION LIMITATIONS: cleaning, laundry, driving, shopping, community activity, yard work, and church  PERSONAL FACTORS: Age, Behavior pattern, Education, Fitness, Past/current experiences, Social background, and Time since onset of injury/illness/exacerbation are also affecting patient's functional outcome.   REHAB POTENTIAL: Fair chronicity of balance issues, recurrent falls   CLINICAL DECISION MAKING: Stable/uncomplicated  EVALUATION COMPLEXITY: Low   GOALS: Goals reviewed with patient? No  SHORT TERM GOALS: Target date: 09/27/2023   Will be compliant with appropriate progressive HEP  Baseline: Goal status: INITIAL  2.  Will name 3 ways to reduce fall risk at home and in the community  Baseline:  Goal status: INITIAL  3.  Will finish 5xSTS in 15 seconds or less to show improved mobility/strength  Baseline:  Goal status: INITIAL    LONG TERM GOALS:  10/25/2023      MMT to have improved by one grade all weak groups  Baseline:  Goal status: INITIAL  2.  Will score at least 18 on DGI to show reduced fall risk  Baseline:  Goal status: INITIAL  3.  Will be  able to perform steps with no rails and distant S Baseline:  Goal status: INITIAL  4.  Will complete with no increased fatigue or unsteadiness  Baseline:  Goal status: INITIAL  5.  PSFS to have improved by at least 3 points  Baseline:  Goal status: INITIAL     PLAN:  PT FREQUENCY: 2x/week  PT DURATION: 8 weeks  PLANNED INTERVENTIONS: 97750- Physical Performance Testing, 97110-Therapeutic exercises, 97530- Therapeutic activity, [id number]- Neuromuscular re-education, 97535- Self Care, 02859- Manual therapy, and 97116- Gait training  PLAN FOR NEXT SESSION: strength and balance, functional activity tolerance, encourage LRAD  Tanda Sorrow, PTA 09/01/23 1:39 PM

## 2023-09-01 NOTE — Assessment & Plan Note (Signed)
 Toradol  and Depo-Medrol  injection was given.

## 2023-09-01 NOTE — Assessment & Plan Note (Signed)
 Chronic pain with exacerbation but could be more lumbar radiculopathy.  Tightness noted overall.  Discussed the potential for another epidural which patient has responded to previously and will order 1 in case patient would like to consider it.  Increase activity slowly otherwise.  Follow-up again in 6 to 8 weeks.

## 2023-09-01 NOTE — Assessment & Plan Note (Signed)
 Chronic, with exacerbation and repeat injection given again today.  Tolerated the procedure well, still wants to avoid any surgical intervention.  Patient does have many comorbidities that does make her more about high risk surgical candidate.  Does have instability.  Seems to be doing relatively well though with the steroid injections.  Did not feel that the viscosupplementation was beneficial previously.  Patient though would like to be potentially consider this again.  Follow-up again in 6 to 8 weeks otherwise

## 2023-09-01 NOTE — Patient Instructions (Addendum)
 Injected L knee today Repeat epidural L5/S1 Get your labs Injections in backside Happy B day to your son See me again in 2-3 months

## 2023-09-04 LAB — ANA: Anti Nuclear Antibody (ANA): NEGATIVE

## 2023-09-12 ENCOUNTER — Ambulatory Visit: Admitting: Physical Therapy

## 2023-09-14 ENCOUNTER — Ambulatory Visit: Admitting: Physical Therapy

## 2023-10-03 NOTE — Discharge Instructions (Signed)

## 2023-10-04 ENCOUNTER — Inpatient Hospital Stay
Admission: RE | Admit: 2023-10-04 | Discharge: 2023-10-04 | Disposition: A | Discharge status: Home / Self Care | Source: Ambulatory Visit | Attending: Family Medicine | Admitting: Family Medicine

## 2023-10-04 DIAGNOSIS — M4727 Other spondylosis with radiculopathy, lumbosacral region: Secondary | ICD-10-CM | POA: Diagnosis not present

## 2023-10-04 DIAGNOSIS — M5416 Radiculopathy, lumbar region: Secondary | ICD-10-CM

## 2023-10-04 MED ORDER — METHYLPREDNISOLONE ACETATE 40 MG/ML INJ SUSP (RADIOLOG
80.0000 mg | Freq: Once | INTRAMUSCULAR | Status: AC
Start: 2023-10-04 — End: 2023-10-04
  Administered 2023-10-04: 80 mg via EPIDURAL

## 2023-10-04 MED ORDER — IOPAMIDOL (ISOVUE-M 200) INJECTION 41%
1.0000 mL | Freq: Once | INTRAMUSCULAR | Status: AC
Start: 2023-10-04 — End: 2023-10-04
  Administered 2023-10-04: 1 mL via EPIDURAL

## 2023-10-05 ENCOUNTER — Telehealth: Payer: Self-pay | Admitting: Family Medicine

## 2023-10-05 ENCOUNTER — Other Ambulatory Visit: Payer: Self-pay

## 2023-10-05 ENCOUNTER — Telehealth: Payer: Self-pay

## 2023-10-05 NOTE — Telephone Encounter (Signed)
 Copied from CRM [id number]. Topic: Clinical - Prescription Issue >> Oct 05, 2023 10:31 AM Burnard DEL wrote: Reason for CRM: Patient called in stating that she received a notice form her insurance that the name for her prolia  injection is going to change to Jubbonti   would like to know if provider could send new prescription for this to [facility]?  [id number] [place], [place] - [phone] AT [facility] OF [facility] & [address]  Phone: [phone] Fax: [phone]

## 2023-10-05 NOTE — Telephone Encounter (Signed)
 Pt had epidural 9/9, for the first time she is experiencing restlessness and inability to sleep. Are we able to call anything in for her?

## 2023-10-06 MED ORDER — DENOSUMAB-BBDZ 60 MG/ML ~~LOC~~ SOSY: 60.0000 mg | PREFILLED_SYRINGE | SUBCUTANEOUS | Status: DC

## 2023-10-06 NOTE — Telephone Encounter (Signed)
 Prescription updated in her medication list, but medication not sent in.  Not sure of the protocol for how we are going to confirm coverage/co-pay and get medication when needed

## 2023-10-24 ENCOUNTER — Telehealth: Payer: Self-pay | Admitting: Family Medicine

## 2023-10-24 NOTE — Telephone Encounter (Signed)
 Patient called and stated that she would like to get a shot in her left knee. She was unsure of what the shot is called so I wanted to send a message back in case prior authorization was needed. Please advise.

## 2023-10-25 ENCOUNTER — Telehealth: Payer: Self-pay

## 2023-10-25 NOTE — Telephone Encounter (Signed)
-----   Message from Berwyn Posey sent at 10/25/2023  7:55 AM EDT ----- Regarding: visco Can you please run patient for visco for L knee?  Thanks

## 2023-10-25 NOTE — Telephone Encounter (Signed)
 Patient ran for Durolane for left knee on 10/25/23. Case ID: [id number]. Pending approval.

## 2023-10-25 NOTE — Telephone Encounter (Signed)
 Patient is going to hold off until her appointment with Dr Claudene.

## 2023-10-25 NOTE — Telephone Encounter (Signed)
 Sent message for patient to be ran for visco.

## 2023-10-25 NOTE — Telephone Encounter (Signed)
 Spoke with patient. She will keep current appointment, but will call if she wants Zilretta  injection with Leonce

## 2023-10-27 NOTE — Telephone Encounter (Signed)
Scheduled 10/7

## 2023-10-27 NOTE — Telephone Encounter (Signed)
 Patient needs an appointment once medication is stocked.   Durolane approved for left knee.   Patient has a Fully Funded Dual Complete Medicare HMO-POS Calendar Year plan with an effective date of 01/26/2023. Plan follow Medicare guidelines. Specialist office visits, Durolane [phone] and procedures 20610/20611 are covered at 80% of the allowable. Deductible has been met. If out of pocket is met, coverage goes to 100%. Patient has Medicaid as secondary insurance. Member?s responsibilities left over by Medicare can be billed to Medicaid (If eligible). Member will be covered at 100% of the allowable amount.  No Pre-cert or referrals needed. Medical notes may be requested at the time of claims processing.  Provider Zachary Michael Smith is In Network with this NPI [id number]. [facility] is In Network with this NPI [id number].  Ref # [id number] Exp: 04/23/2024

## 2023-10-31 NOTE — Progress Notes (Unsigned)
 [facility] [address] [place] [address] Phone: [phone] Subjective:   [name], am serving as a scribe for Dr. Arthea Claudene.  I'm seeing this patient by the request  of:  Geofm Glade PARAS, MD  CC: Bilateral knee pain  YEP:Dlagzrupcz  Kristina Ayala is a 78 y.o. female coming in with complaint of L knee pain. Here for Durolane injection. Epidural for back on 10/04/2023. Patient states pain in L knee persist. R knee starting to hurt as well.       Past Medical History:  Diagnosis Date   Arthritis    Bursitis    left elbow   Clostridium difficile infection    Diabetes mellitus without complication (HCC)    Frequent falls    HLD (hyperlipidemia)    Hypertension    Infectious colitis    Past Surgical History:  Procedure Laterality Date   CATARACT EXTRACTION Bilateral    COLONOSCOPY     KNEE SURGERY  2014   ORIF PROXIMAL TIBIAL PLATEAU FRACTURE Left 02/19/2012   knee   POLYPECTOMY     TONSILLECTOMY     Social History   Socioeconomic History   Marital status: Divorced    Spouse name: Not on file   Number of children: 2   Years of education: Not on file   Highest education level: Some college, no degree  Occupational History   Occupation: RETIRED  Tobacco Use   Smoking status: Former    Current packs/day: 0.00    Average packs/day: 0.3 packs/day for 50.0 years (12.5 ttl pk-yrs)    Types: Cigarettes    Start date: 08/27/1964    Quit date: 08/28/2014    Years since quitting: 9.1   Smokeless tobacco: Never   Tobacco comments:    1 pack every 3 days  Vaping Use   Vaping status: Never Used  Substance and Sexual Activity   Alcohol use: No    Alcohol/week: 0.0 standard drinks of alcohol   Drug use: No   Sexual activity: Not Currently  Other Topics Concern   Not on file  Social History Narrative   Lives alone    Right handed   Caffeine: 3x weekly         Social Drivers of Health   Financial Resource  Strain: Low Risk  (08/29/2023)   Overall Financial Resource Strain (CARDIA)    Difficulty of Paying Living Expenses: Not hard at all  Food Insecurity: No Food Insecurity (08/29/2023)   Hunger Vital Sign    Worried About Running Out of Food in the Last Year: Never true    Ran Out of Food in the Last Year: Never true  Transportation Needs: No Transportation Needs (08/29/2023)   PRAPARE - Administrator, Civil Service (Medical): No    Lack of Transportation (Non-Medical): No  Physical Activity: Insufficiently Active (08/29/2023)   Exercise Vital Sign    Days of Exercise per Week: 5 days    Minutes of Exercise per Session: 10 min  Stress: No Stress Concern Present (08/29/2023)   [employer] of Occupational Health - Occupational Stress Questionnaire    Feeling of Stress: Not at all  Social Connections: Moderately Integrated (08/29/2023)   Social Connection and Isolation Panel    Frequency of Communication with Friends and Family: More than three times a week    Frequency of Social Gatherings with Friends and Family: Three times a week    Attends Religious Services: More  than 4 times per year    Active Member of Clubs or Organizations: Yes    Attends Banker Meetings: More than 4 times per year    Marital Status: Divorced   Allergies  Allergen Reactions   Doxycycline Diarrhea   Minocycline Diarrhea   Wellbutrin  [Bupropion ] Swelling    Per pt her tongue was swollen and sore/symptoms stopped once the medication was discontinued.    Lactose Intolerance (Gi) Diarrhea   Cymbalta  [Duloxetine  Hcl] Other (See Comments)    Disorientated, dizzy, loss of appetite, stomach discomfort   Penicillins Rash    Has patient had a PCN reaction causing immediate rash, facial/tongue/throat swelling, SOB or lightheadedness with hypotension: no Has patient had a PCN reaction causing severe rash involving mucus membranes or skin necrosis: no Has patient had a PCN reaction that required  hospitalization: unknown Has patient had a PCN reaction occurring within the last 10 years: no If all of the above answers are NO, then may proceed with Cephalosporin use.    Z-Pak [Azithromycin ] Other (See Comments)    diarrhea   Family History  Problem Relation Age of Onset   Diverticulosis Mother    Ovarian cancer Mother    Pancreatic cancer Father    Lung cancer Father    Colon polyps Sister    Heart failure Daughter    Diabetes Son    Colon cancer Neg Hx    Breast cancer Neg Hx    Esophageal cancer Neg Hx    Stomach cancer Neg Hx     Current Outpatient Medications (Endocrine & Metabolic):    denosumab -bbdz 60 MG/ML SOSY, Inject 60 mg into the skin every 6 (six) months.  Current Outpatient Medications (Cardiovascular):    amLODipine  (NORVASC ) 10 MG tablet,    atorvastatin  (LIPITOR) 40 MG tablet, TAKE 1 TABLET(40 MG) BY MOUTH DAILY. FOLLOW-UP APPOINTMENT WITH LABS ARE DUE   hydrochlorothiazide  (HYDRODIURIL ) 25 MG tablet, TAKE 1 TABLET(25 MG) BY MOUTH EVERY MORNING   lisinopril  (ZESTRIL ) 40 MG tablet, TAKE 1 TABLET(40 MG) BY MOUTH DAILY  Current Outpatient Medications (Respiratory):    albuterol  (VENTOLIN  HFA) 108 (90 Base) MCG/ACT inhaler, SMARTSIG:1-2 Puff(s) By Mouth Every 6 Hours PRN   fluticasone  (FLONASE ) 50 MCG/ACT nasal spray, Place 2 sprays into both nostrils daily.  Current Outpatient Medications (Analgesics):    meloxicam  (MOBIC ) 7.5 MG tablet, Take 1 tablet (7.5 mg total) by mouth daily as needed for pain take with food  Current Outpatient Medications (Hematological):    Cyanocobalamin  (VITAMIN B-12) 2500 MCG SUBL, Place 2,500 mcg under the tongue daily.  Current Outpatient Medications (Other):    ALPRAZolam  (XANAX ) 0.5 MG tablet, TAKE 1 TABLET(0.5 MG) BY MOUTH EVERY DAY   cholecalciferol (VITAMIN D ) 25 MCG (1000 UNIT) tablet, Take 2,000 Units by mouth daily.   diclofenac  Sodium (VOLTAREN ) 1 % GEL, Apply 2 g topically 4 (four) times daily. To affected  joint.   gabapentin  (NEURONTIN ) 100 MG capsule, Take 1 capsule (100 mg total) by mouth at bedtime as needed.   hydrocortisone 2.5 % cream, Apply topically.   MAGNESIUM  PO, Take by mouth.   potassium chloride  (KLOR-CON ) 10 MEQ tablet, TAKE 1 TABLET(10 MEQ) BY MOUTH DAILY   RETIN-A 0.05 % cream, Apply topically at bedtime.   TURMERIC PO, Take 1 tablet by mouth daily.   Reviewed prior external information including notes and imaging from  primary care provider As well as notes that were available from care everywhere and other healthcare systems.  Past medical  history, social, surgical and family history all reviewed in electronic medical record.  No pertanent information unless stated regarding to the chief complaint.   Review of Systems:  No headache, visual changes, nausea, vomiting, diarrhea, constipation, dizziness, abdominal pain, skin rash, fevers, chills, night sweats, weight loss, swollen lymph nodes, body aches, joint swelling, chest pain, shortness of breath, mood changes. POSITIVE muscle aches  Objective  Blood pressure 126/82, pulse 89, height 5' 4 (1.626 m), weight 159 lb (72.1 kg), SpO2 96%.   General: No apparent distress alert and oriented x3 mood and affect normal, dressed appropriately.  HEENT: Pupils equal, extraocular movements intact  Respiratory: Patient's speak in full sentences and does not appear short of breath  Cardiovascular: No lower extremity edema, non tender, no erythema  Severely antalgic gait noted.  Seems to be tender to palpation in the lower back as well.  Loss of lordosis.  Both knees though have trace effusion noted.  After informed written and verbal consent, patient was seated on exam table. Right knee was prepped with alcohol swab and utilizing anterolateral approach, patient's right knee space was injected with 4:1  marcaine 0.5%: Kenalog  40mg /dL. Patient tolerated the procedure well without immediate complications.  After informed written and  verbal consent, patient was seated on exam table. Left knee was prepped with alcohol swab and utilizing anterolateral approach, patient's left knee space was injected with 4:1  marcaine 0.5%: Kenalog  40mg /dL. Patient tolerated the procedure well without immediate complications.    Impression and Recommendations:    The above documentation has been reviewed and is accurate and complete Broox Lonigro M Acire Tang, DO

## 2023-11-01 ENCOUNTER — Ambulatory Visit: Admitting: Family Medicine

## 2023-11-01 ENCOUNTER — Ambulatory Visit: Payer: Self-pay

## 2023-11-01 VITALS — BP 126/82 | HR 89 | Ht 64.0 in | Wt 159.0 lb

## 2023-11-01 DIAGNOSIS — M25561 Pain in right knee: Secondary | ICD-10-CM

## 2023-11-01 DIAGNOSIS — M25562 Pain in left knee: Secondary | ICD-10-CM | POA: Diagnosis not present

## 2023-11-01 DIAGNOSIS — M1711 Unilateral primary osteoarthritis, right knee: Secondary | ICD-10-CM | POA: Diagnosis not present

## 2023-11-01 DIAGNOSIS — M25462 Effusion, left knee: Secondary | ICD-10-CM | POA: Diagnosis not present

## 2023-11-01 DIAGNOSIS — M1732 Unilateral post-traumatic osteoarthritis, left knee: Secondary | ICD-10-CM | POA: Diagnosis not present

## 2023-11-01 MED ORDER — KETOROLAC TROMETHAMINE 60 MG/2ML IM SOLN
60.0000 mg | Freq: Once | INTRAMUSCULAR | Status: AC
  Administered 2023-11-01: 60 mg via INTRAMUSCULAR

## 2023-11-01 NOTE — Telephone Encounter (Signed)
 FYI Only or Action Required?: Action required by provider: clinical question for provider.  Patient was last seen in primary care on 08/31/2023 by Geofm Glade PARAS, MD.  Called Nurse Triage reporting Fatigue.  Symptoms began today.  Interventions attempted: Nothing.  Symptoms are: stable.  Triage Disposition: See PCP When Office is Open (Within 3 Days)  Patient/caregiver understands and will follow disposition?: No, wishes to speak with PCP  Copied from CRM #[id number]. Topic: Clinical - Red Word Triage >> Nov 01, 2023  3:55 PM Suzen RAMAN wrote: Red Word that prompted transfer to Nurse Triage:  green stool,severe fatigue Reason for Disposition  [1] Numbness or tingling on both sides of body AND [2] is a new symptom present > 24 hours  Answer Assessment - Initial Assessment Questions 1. DESCRIPTION: Describe how you are feeling.     Today, sitting in recliner, dozed off, woke up my body, felt like my entire body went to sleep; both arms and legs had a tingling sensation. Was still able to talk to stand up, took while to get balance pt reports not dizziness or not from head, reports tingling in arms and legs, pt reports still was bale to walk, talk. Denies HA, blurred vision, no diff breath 2. SEVERITY: How bad is it?  Can you stand and walk?     Yes, walk around with no problems 3. ONSET: When did these symptoms begin? (e.g., hours, days, weeks, months)     3 to 5 minutes to stop tingling 4. CAUSE: What do you think is causing the weakness or fatigue? (e.g., not drinking enough fluids, medical problem, trouble sleeping)    Maybe haven't been drinking fluids 5. NEW MEDICINES:  Have you started on any new medicines recently? (e.g., opioid pain medicines, benzodiazepines, muscle relaxants, antidepressants, antihistamines, neuroleptics, beta blockers)     no 6. OTHER SYMPTOMS: Do you have any other symptoms? (e.g., chest pain, fever, cough, SOB, vomiting, diarrhea, bleeding, other  areas of pain)     Denies chest pain, abd pain, fever, chills Reports green stool, soft stool, army green, n/v/d, denies dizziness, last BM today.  Answer Assessment - Initial Assessment Questions No available appts today, scheduled for tomorrow with alternative provider, pt declined/cancelled appt. Advised UC/ED if symptoms worsen.  1. DESCRIPTION: Describe how you are feeling.     Today, sitting in chair, dozed off, woke up my body, felt like my entire body went to sleep; both arms and legs had a tingling sensation. Was still able to talk, stand up, took while to get balance due to tingling. Pt reports not dizziness or not from head, just arms and legs went to sleep.  Denies HA, blurred vision, diff breath, diff walking/talking during episode and denies symptoms now.  2. SEVERITY: How bad is it?  Can you stand and walk?     Yes, can walk around with no problems  3. ONSET: When did these symptoms begin? (e.g., hours, days, weeks, months)     3 to 5 minutes to stop tingling; happened around 12 PM.  4. CAUSE: What do you think is causing the weakness or fatigue? (e.g., not drinking enough fluids, medical problem, trouble sleeping)    Maybe haven't been drinking fluids  5. NEW MEDICINES:  Have you started on any new medicines recently? (e.g., opioid pain medicines, benzodiazepines, muscle relaxants, antidepressants, antihistamines, neuroleptics, beta blockers)     No; had corticosteroid injections this morning.  6. OTHER SYMPTOMS: Do you have any other symptoms? (e.g., chest  pain, fever, cough, SOB, vomiting, diarrhea, bleeding, other areas of pain)     Denies chest pain, abd pain, fever, chills Reports green stool, soft stool, army green, n/v/d, denies dizziness, last BM today.  Protocols used: Weakness (Generalized) and Fatigue-A-AH, Neurologic Deficit-A-AH

## 2023-11-01 NOTE — Assessment & Plan Note (Signed)
 Chronic, with another exacerbation.  Has responded to injections previously.  Patient does not want to consider surgical intervention.  Will be traveling soon and needs more improvement so we decided on the steroid instead of the viscosupplementation today.  Patient will come back after her travels and we will consider the possibility of viscosupplementation.  Contralateral knee starting to have more arthritic changes as well.

## 2023-11-01 NOTE — Telephone Encounter (Signed)
See previous triage encounter.

## 2023-11-01 NOTE — Assessment & Plan Note (Signed)
 Patient given injection and tolerated suture well, discussed icing regimen and home exercises, discussed which activities to do and things to avoid.  Increase activity slowly.

## 2023-11-01 NOTE — Telephone Encounter (Signed)
 Pt called in today after being triaged previously by NT: pt stated she informed the 1st nurse she was not interested in an appt and would just wait things out.  Pt has reconsidered and wants to be scheduled for app: nurse scheduled pt for tomorrow.

## 2023-11-01 NOTE — Patient Instructions (Addendum)
 Good to see you! Toradol  60 in backside today.  See you again in 8-10 weeks

## 2023-11-02 ENCOUNTER — Ambulatory Visit: Admitting: Internal Medicine

## 2023-11-02 ENCOUNTER — Ambulatory Visit: Admitting: Adult Health

## 2023-11-10 ENCOUNTER — Other Ambulatory Visit: Payer: Self-pay | Admitting: Internal Medicine

## 2023-11-10 MED ORDER — MELOXICAM 7.5 MG PO TABS: 7.5000 mg | ORAL_TABLET | Freq: Every day | ORAL | 0 refills | Status: DC | PRN

## 2023-11-10 NOTE — Telephone Encounter (Signed)
 Copied from CRM #[id number]. Topic: Clinical - Medication Refill >> Nov 10, 2023 12:09 PM Robinson H wrote: Medication: meloxicam  (MOBIC ) 7.5 MG tablet  Has the patient contacted their pharmacy? No, didn't order from that pharmacy last refill (Agent: If no, request that the patient contact the pharmacy for the refill. If patient does not wish to contact the pharmacy document the reason why and proceed with request.) (Agent: If yes, when and what did the pharmacy advise?)  This is the patient's preferred pharmacy:  [id number] [address], [place] - [address] AT [facility] OF [facility] & [address] [place] [address] Phone: [phone] Fax: [phone]   Is this the correct pharmacy for this prescription? Yes If no, delete pharmacy and type the correct one.   Has the prescription been filled recently? No  Is the patient out of the medication? Yes  Has the patient been seen for an appointment in the last year OR does the patient have an upcoming appointment? Yes  Can we respond through MyChart? Yes  Agent: Please be advised that Rx refills may take up to 3 business days. We ask that you follow-up with your pharmacy.

## 2023-11-17 ENCOUNTER — Other Ambulatory Visit: Payer: Self-pay | Admitting: Family Medicine

## 2023-11-18 ENCOUNTER — Other Ambulatory Visit: Payer: Self-pay | Admitting: Internal Medicine

## 2023-11-18 ENCOUNTER — Ambulatory Visit (INDEPENDENT_AMBULATORY_CARE_PROVIDER_SITE_OTHER)

## 2023-11-18 DIAGNOSIS — Z23 Encounter for immunization: Secondary | ICD-10-CM | POA: Diagnosis not present

## 2023-11-18 MED ORDER — ALPRAZOLAM 0.5 MG PO TABS: ORAL_TABLET | ORAL | 4 refills | Status: DC

## 2023-11-18 NOTE — Telephone Encounter (Unsigned)
 Copied from CRM [id number]. Topic: General - Other >> Nov 18, 2023 10:13 AM Nessti S wrote: Reason for CRM: pt would like nurse to renew ALPRAZolam  (XANAX ) 0.5 MG tablet. Already sent refill crm

## 2023-11-18 NOTE — Progress Notes (Signed)
 Pt received there flu shot today there were no questions or concerns.

## 2023-11-18 NOTE — Telephone Encounter (Unsigned)
 Copied from CRM [id number]. Topic: Clinical - Medication Refill >> Nov 18, 2023 10:11 AM Nessti S wrote: Medication: ALPRAZolam  (XANAX ) 0.5 MG tablet  Has the patient contacted their pharmacy? Yes (Agent: If no, request that the patient contact the pharmacy for the refill. If patient does not wish to contact the pharmacy document the reason why and proceed with request.) (Agent: If yes, when and what did the pharmacy advise?)  This is the patient's preferred pharmacy:  [id number] [place], [place] - [address] AT [facility] OF [facility] & [address] [place] [address] Phone: [phone] Fax: [phone]  Is this the correct pharmacy for this prescription? Yes If no, delete pharmacy and type the correct one.   Has the prescription been filled recently? Yes  Is the patient out of the medication? Yes  Has the patient been seen for an appointment in the last year OR does the patient have an upcoming appointment? Yes  Can we respond through MyChart? Yes  Agent: Please be advised that Rx refills may take up to 3 business days. We ask that you follow-up with your pharmacy.

## 2023-12-05 ENCOUNTER — Other Ambulatory Visit (HOSPITAL_COMMUNITY): Payer: Self-pay

## 2023-12-07 ENCOUNTER — Ambulatory Visit
Admission: RE | Admit: 2023-12-07 | Discharge: 2023-12-07 | Disposition: A | Discharge status: Home / Self Care | Source: Ambulatory Visit | Attending: Internal Medicine | Admitting: Internal Medicine

## 2023-12-07 DIAGNOSIS — R918 Other nonspecific abnormal finding of lung field: Secondary | ICD-10-CM

## 2023-12-10 ENCOUNTER — Ambulatory Visit: Payer: Self-pay | Admitting: Internal Medicine

## 2023-12-10 DIAGNOSIS — E041 Nontoxic single thyroid nodule: Secondary | ICD-10-CM

## 2023-12-14 ENCOUNTER — Telehealth: Payer: Self-pay

## 2023-12-14 ENCOUNTER — Telehealth: Payer: Self-pay | Admitting: Family Medicine

## 2023-12-14 NOTE — Telephone Encounter (Signed)
 Patient called and asked about getting another epidural because her back is hurting. She would like to know if she should wait until her appt to do that or not. Please advise.

## 2023-12-14 NOTE — Telephone Encounter (Signed)
 Copied from CRM [id number]. Topic: Clinical - Medical Advice >> Dec 14, 2023 10:34 AM Mesmerise C wrote: Reason for CRM: Provider states patient has diabetes was also not aware and insurance needs to know to what extent she has it would like forwarded to her to send over to insurance and a call to go over what type diabetic she is

## 2023-12-15 ENCOUNTER — Other Ambulatory Visit: Payer: Self-pay

## 2023-12-15 DIAGNOSIS — M5416 Radiculopathy, lumbar region: Secondary | ICD-10-CM

## 2023-12-16 ENCOUNTER — Ambulatory Visit (INDEPENDENT_AMBULATORY_CARE_PROVIDER_SITE_OTHER): Admitting: Gastroenterology

## 2023-12-16 ENCOUNTER — Encounter: Payer: Self-pay | Admitting: Gastroenterology

## 2023-12-16 VITALS — BP 138/78 | HR 106 | Ht 64.0 in | Wt 159.0 lb

## 2023-12-16 DIAGNOSIS — R159 Full incontinence of feces: Secondary | ICD-10-CM | POA: Diagnosis not present

## 2023-12-16 NOTE — Progress Notes (Signed)
 Chief Complaint: Fecal leakage Primary GI MD: Dr. Federico  HPI: Discussed the use of AI scribe software for clinical note transcription with the patient, who gave verbal consent to proceed.  Kristina Ayala is a 78 year old female who presents with bowel leakage and abnormal bowel movements.  She has been experiencing bowel leakage, which has worsened over the past week or two, leading to staining of her underwear, particularly at night. Her bowel movements are described as 'mud-like'. She feels unable to completely evacuate her bowels, contributing to the leakage.  She recalls that avoiding dairy had previously improved her symptoms, but she has recently been consuming lactate milk and occasionally ice cream, which may be contributing to the recurrence of symptoms. She has not been consistently taking a fiber supplement.  Her last colonoscopy was in 2014, which showed no polyps and was otherwise unremarkable. She expresses reluctance to undergo another colonoscopy.   Past Medical History:  Diagnosis Date   Arthritis    Bursitis    left elbow   Clostridium difficile infection    Diabetes mellitus without complication (HCC)    Frequent falls    HLD (hyperlipidemia)    Hypertension    Infectious colitis     Past Surgical History:  Procedure Laterality Date   CATARACT EXTRACTION Bilateral    COLONOSCOPY     KNEE SURGERY  2014   ORIF PROXIMAL TIBIAL PLATEAU FRACTURE Left 02/19/2012   knee   POLYPECTOMY     TONSILLECTOMY      Current Outpatient Medications  Medication Sig Dispense Refill   denosumab  (PROLIA ) 60 MG/ML SOSY injection Inject 60 mg into the skin every 6 (six) months.     albuterol  (VENTOLIN  HFA) 108 (90 Base) MCG/ACT inhaler SMARTSIG:1-2 Puff(s) By Mouth Every 6 Hours PRN     ALPRAZolam  (XANAX ) 0.5 MG tablet TAKE 1 TABLET(0.5 MG) BY MOUTH EVERY DAY 30 tablet 4   amLODipine  (NORVASC ) 10 MG tablet      atorvastatin  (LIPITOR) 40 MG tablet TAKE 1 TABLET(40 MG)  BY MOUTH DAILY. FOLLOW-UP APPOINTMENT WITH LABS ARE DUE 90 tablet 1   cholecalciferol (VITAMIN D ) 25 MCG (1000 UNIT) tablet Take 2,000 Units by mouth daily.     Cyanocobalamin  (VITAMIN B-12) 2500 MCG SUBL Place 2,500 mcg under the tongue daily.     diclofenac  Sodium (VOLTAREN ) 1 % GEL Apply 2 g topically 4 (four) times daily. To affected joint. 100 g 11   fluticasone  (FLONASE ) 50 MCG/ACT nasal spray Place 2 sprays into both nostrils daily. 16 g 6   gabapentin  (NEURONTIN ) 100 MG capsule Take 1 capsule (100 mg total) by mouth at bedtime as needed. 100 capsule 2   hydrochlorothiazide  (HYDRODIURIL ) 25 MG tablet TAKE 1 TABLET(25 MG) BY MOUTH EVERY MORNING 90 tablet 3   hydrocortisone 2.5 % cream Apply topically.     lisinopril  (ZESTRIL ) 40 MG tablet TAKE 1 TABLET(40 MG) BY MOUTH DAILY 90 tablet 2   MAGNESIUM  PO Take by mouth.     meloxicam  (MOBIC ) 7.5 MG tablet Take 1 tablet (7.5 mg total) by mouth daily as needed for pain take with food 30 tablet 0   potassium chloride  (KLOR-CON ) 10 MEQ tablet TAKE 1 TABLET(10 MEQ) BY MOUTH DAILY 30 tablet 3   RETIN-A 0.05 % cream Apply topically at bedtime.     TURMERIC PO Take 1 tablet by mouth daily.     No current facility-administered medications for this visit.    Allergies as of 12/16/2023 - Review  Complete 09/01/2023  Allergen Reaction Noted   Doxycycline Diarrhea 08/03/2011   Minocycline Diarrhea 08/03/2011   Wellbutrin  [bupropion ] Swelling 07/10/2014   Lactose intolerance (gi) Diarrhea 01/13/2023   Penicillin g Other (See Comments) 12/16/2023   Cymbalta  [duloxetine  hcl] Other (See Comments) 06/21/2023   Penicillins Rash 12/18/2010   Z-pak [azithromycin ] Other (See Comments) 02/12/2020    Family History  Problem Relation Age of Onset   Diverticulosis Mother    Ovarian cancer Mother    Pancreatic cancer Father    Lung cancer Father    Colon polyps Sister    Colon cancer Brother    Heart failure Daughter    Diabetes Son    Breast cancer Neg  Hx    Esophageal cancer Neg Hx    Stomach cancer Neg Hx     Social History   Socioeconomic History   Marital status: Divorced    Spouse name: Not on file   Number of children: 2   Years of education: Not on file   Highest education level: Some college, no degree  Occupational History   Occupation: RETIRED  Tobacco Use   Smoking status: Former    Current packs/day: 0.00    Average packs/day: 0.3 packs/day for 50.0 years (12.5 ttl pk-yrs)    Types: Cigarettes    Start date: 08/27/1964    Quit date: 08/28/2014    Years since quitting: 9.3   Smokeless tobacco: Never   Tobacco comments:    1 pack every 3 days  Vaping Use   Vaping status: Never Used  Substance and Sexual Activity   Alcohol use: No    Alcohol/week: 0.0 standard drinks of alcohol   Drug use: No   Sexual activity: Not Currently  Other Topics Concern   Not on file  Social History Narrative   Lives alone    Right handed   Caffeine: 3x weekly         Social Drivers of Health   Financial Resource Strain: Low Risk  (08/29/2023)   Overall Financial Resource Strain (CARDIA)    Difficulty of Paying Living Expenses: Not hard at all  Food Insecurity: No Food Insecurity (08/29/2023)   Hunger Vital Sign    Worried About Running Out of Food in the Last Year: Never true    Ran Out of Food in the Last Year: Never true  Transportation Needs: No Transportation Needs (08/29/2023)   PRAPARE - Administrator, Civil Service (Medical): No    Lack of Transportation (Non-Medical): No  Physical Activity: Insufficiently Active (08/29/2023)   Exercise Vital Sign    Days of Exercise per Week: 5 days    Minutes of Exercise per Session: 10 min  Stress: No Stress Concern Present (08/29/2023)   [employer] of Occupational Health - Occupational Stress Questionnaire    Feeling of Stress: Not at all  Social Connections: Moderately Integrated (08/29/2023)   Social Connection and Isolation Panel    Frequency of Communication with  Friends and Family: More than three times a week    Frequency of Social Gatherings with Friends and Family: Three times a week    Attends Religious Services: More than 4 times per year    Active Member of Clubs or Organizations: Yes    Attends Banker Meetings: More than 4 times per year    Marital Status: Divorced  Intimate Partner Violence: Not At Risk (08/29/2023)   Humiliation, Afraid, Rape, and Kick questionnaire    Fear of Current or  Ex-Partner: No    Emotionally Abused: No    Physically Abused: No    Sexually Abused: No    Review of Systems:    Constitutional: No weight loss, fever, chills, weakness or fatigue HEENT: Eyes: No change in vision               Ears, Nose, Throat:  No change in hearing or congestion Skin: No rash or itching Cardiovascular: No chest pain, chest pressure or palpitations   Respiratory: No SOB or cough Gastrointestinal: See HPI and otherwise negative Genitourinary: No dysuria or change in urinary frequency Neurological: No headache, dizziness or syncope Musculoskeletal: No new muscle or joint pain Hematologic: No bleeding or bruising Psychiatric: No history of depression or anxiety    Physical Exam:  Vital signs: BP 138/78   Pulse (!) 106   Ht 5' 4 (1.626 m)   Wt 159 lb (72.1 kg)   SpO2 98%   BMI 27.29 kg/m   Constitutional: NAD, alert and cooperative Head:  Normocephalic and atraumatic. Eyes:   PEERL, EOMI. No icterus. Conjunctiva pink. Respiratory: Respirations even and unlabored. Lungs clear to auscultation bilaterally.   No wheezes, crackles, or rhonchi.  Cardiovascular:  Regular rate and rhythm. No peripheral edema, cyanosis or pallor.  Gastrointestinal:  Soft, nondistended, nontender. No rebound or guarding. Normal bowel sounds. No appreciable masses or hepatomegaly. Rectal:  Declines adamantly Msk:  Symmetrical without gross deformities. Without edema, no deformity or joint abnormality.  Neurologic:  Alert and  oriented  x4;  grossly normal neurologically.  Skin:   Dry and intact without significant lesions or rashes. Psychiatric: Oriented to person, place and time. Demonstrates good judgement and reason without abnormal affect or behaviors.  RELEVANT LABS AND IMAGING: CBC    Component Value Date/Time   WBC 5.0 09/01/2023 0925   RBC 4.90 09/01/2023 0925   HGB 13.1 09/01/2023 0925   HCT 40.1 09/01/2023 0925   PLT 276.0 09/01/2023 0925   MCV 81.7 09/01/2023 0925   MCH 27.1 01/28/2022 1605   MCHC 32.8 09/01/2023 0925   RDW 15.5 09/01/2023 0925   LYMPHSABS 2.2 09/01/2023 0925   MONOABS 0.5 09/01/2023 0925   EOSABS 0.1 09/01/2023 0925   BASOSABS 0.1 09/01/2023 0925    CMP     Component Value Date/Time   NA 144 09/01/2023 0925   K 4.2 09/01/2023 0925   CL 104 09/01/2023 0925   CO2 28 09/01/2023 0925   GLUCOSE 85 09/01/2023 0925   BUN 15 09/01/2023 0925   CREATININE 0.69 09/01/2023 0925   CREATININE 0.73 10/20/2012 1145   CALCIUM  9.6 09/01/2023 0925   PROT 6.9 09/01/2023 0925   ALBUMIN 4.3 09/01/2023 0925   AST 20 09/01/2023 0925   ALT 22 09/01/2023 0925   ALKPHOS 40 09/01/2023 0925   BILITOT 0.4 09/01/2023 0925   GFRNONAA >60 01/28/2022 1605   GFRAA >60 04/18/2017 1431     Assessment/Plan:   Fecal incontinence Loose stools Patient declines pelvic floor physical therapy or colonoscopy at this time. Last colonoscopy 2014 was normal.  Previous rectal exam showed decreased rectal tone. Not on fiber. Dairy possible trigger -- patient would benefit from pelvic floor PT, she declines -- recommend adding fiber (benefiber) -- squatty potty - Continue to avoid lactose -- can try lactacid if she wants to have something with lactose and see if it helps - Discussed if things change we be happy to do a colonoscopy as she appears significantly younger than stated age. Encouraged colonoscopy due  to persistent symptoms but patient declined    Nestor Blower, DEVONNA [facility]  Gastroenterology 12/16/2023, 12:28 PM  Cc: Geofm Glade PARAS, MD

## 2023-12-16 NOTE — Patient Instructions (Addendum)
 _______________________________________________________  If your blood pressure at your visit was 140/90 or greater, please contact your primary care physician to follow up on this.  _______________________________________________________  If you are age 79 or older, your body mass index should be between 23-30. Your Body mass index is 27.29 kg/m. If this is out of the aforementioned range listed, please consider follow up with your Primary Care Provider.  If you are age 42 or younger, your body mass index should be between 19-25. Your Body mass index is 27.29 kg/m. If this is out of the aformentioned range listed, please consider follow up with your Primary Care Provider.   ________________________________________________________  The [facility] [facility] providers would like to encourage you to use MYCHART to communicate with providers for non-urgent requests or questions.  Due to long hold times on the telephone, sending your provider a message by [facility] may be a faster and more efficient way to get a response.  Please allow 48 business hours for a response.  Please remember that this is for non-urgent requests.  _______________________________________________________  [facility] is using a team-based approach to care.  Your team is made up of your doctor and two to three APPS. Our APPS (Nurse Practitioners and Physician Assistants) work with your physician to ensure care continuity for you. They are fully qualified to address your health concerns and develop a treatment plan. They communicate directly with your gastroenterologist to care for you. Seeing the Advanced Practice Practitioners on your physician's team can help you by facilitating care more promptly, often allowing for earlier appointments, access to diagnostic testing, procedures, and other specialty referrals.   A high fiber diet with plenty of fluids (up to 8 glasses of water daily) is suggested to relieve these symptoms.   Benefiber-1 tablespoon once or twice daily can be used to keep bowels regular if needed.   Squatty potty--This can be purchased on [employer].

## 2023-12-20 NOTE — Telephone Encounter (Signed)
 Spoke with patient today.

## 2023-12-20 NOTE — Progress Notes (Signed)
 [facility] [address] [place] [address] Phone: [phone] Subjective:   Kristina Ayala, am serving as a scribe for Dr. Arthea Claudene.  I'm seeing this patient by the request  of:  Geofm Glade PARAS, MD  CC: bilateral knee pain   YEP:Dlagzrupcz  11/01/2023 Patient given injection and tolerated suture well, discussed icing regimen and home exercises, discussed which activities to do and things to avoid.  Increase activity slowly.     Chronic, with another exacerbation.  Has responded to injections previously.  Patient does not want to consider surgical intervention.  Will be traveling soon and needs more improvement so we decided on the steroid instead of the viscosupplementation today.  Patient will come back after her travels and we will consider the possibility of viscosupplementation.  Contralateral knee starting to have more arthritic changes as well.     Update 12/21/2023 Kristina Ayala is a 78 y.o. female coming in with complaint of B knee pain. Durolane last visit. Patient states last round of injections helped a lot. Stressed because of personal reasons. Epidural time? Back feels a lot better after having heat applied. Knees feeling fine.       Past Medical History:  Diagnosis Date   Arthritis    Bursitis    left elbow   Clostridium difficile infection    Diabetes mellitus without complication (HCC)    Frequent falls    HLD (hyperlipidemia)    Hypertension    Infectious colitis    Past Surgical History:  Procedure Laterality Date   CATARACT EXTRACTION Bilateral    COLONOSCOPY     KNEE SURGERY  2014   ORIF PROXIMAL TIBIAL PLATEAU FRACTURE Left 02/19/2012   knee   POLYPECTOMY     TONSILLECTOMY     Social History   Socioeconomic History   Marital status: Divorced    Spouse name: Not on file   Number of children: 2   Years of education: Not on file   Highest education level: Some college, no degree  Occupational  History   Occupation: RETIRED  Tobacco Use   Smoking status: Former    Current packs/day: 0.00    Average packs/day: 0.3 packs/day for 50.0 years (12.5 ttl pk-yrs)    Types: Cigarettes    Start date: 08/27/1964    Quit date: 08/28/2014    Years since quitting: 9.3   Smokeless tobacco: Never   Tobacco comments:    1 pack every 3 days  Vaping Use   Vaping status: Never Used  Substance and Sexual Activity   Alcohol use: No    Alcohol/week: 0.0 standard drinks of alcohol   Drug use: No   Sexual activity: Not Currently  Other Topics Concern   Not on file  Social History Narrative   Lives alone    Right handed   Caffeine: 3x weekly         Social Drivers of Health   Financial Resource Strain: Low Risk  (08/29/2023)   Overall Financial Resource Strain (CARDIA)    Difficulty of Paying Living Expenses: Not hard at all  Food Insecurity: No Food Insecurity (08/29/2023)   Hunger Vital Sign    Worried About Running Out of Food in the Last Year: Never true    Ran Out of Food in the Last Year: Never true  Transportation Needs: No Transportation Needs (08/29/2023)   PRAPARE - Transportation    Lack of Transportation (Medical): No    Lack of  Transportation (Non-Medical): No  Physical Activity: Insufficiently Active (08/29/2023)   Exercise Vital Sign    Days of Exercise per Week: 5 days    Minutes of Exercise per Session: 10 min  Stress: No Stress Concern Present (08/29/2023)   [employer] of Occupational Health - Occupational Stress Questionnaire    Feeling of Stress: Not at all  Social Connections: Moderately Integrated (08/29/2023)   Social Connection and Isolation Panel    Frequency of Communication with Friends and Family: More than three times a week    Frequency of Social Gatherings with Friends and Family: Three times a week    Attends Religious Services: More than 4 times per year    Active Member of Clubs or Organizations: Yes    Attends Banker Meetings: More than 4  times per year    Marital Status: Divorced   Allergies  Allergen Reactions   Doxycycline Diarrhea   Minocycline Diarrhea   Wellbutrin  [Bupropion ] Swelling    Per pt her tongue was swollen and sore/symptoms stopped once the medication was discontinued.    Lactose Intolerance (Gi) Diarrhea   Penicillin G Other (See Comments)   Cymbalta  [Duloxetine  Hcl] Other (See Comments)    Disorientated, dizzy, loss of appetite, stomach discomfort   Penicillins Rash    Has patient had a PCN reaction causing immediate rash, facial/tongue/throat swelling, SOB or lightheadedness with hypotension: no Has patient had a PCN reaction causing severe rash involving mucus membranes or skin necrosis: no Has patient had a PCN reaction that required hospitalization: unknown Has patient had a PCN reaction occurring within the last 10 years: no If all of the above answers are NO, then may proceed with Cephalosporin use.    Z-Pak [Azithromycin ] Other (See Comments)    diarrhea   Family History  Problem Relation Age of Onset   Diverticulosis Mother    Ovarian cancer Mother    Pancreatic cancer Father    Lung cancer Father    Colon polyps Sister    Colon cancer Brother    Heart failure Daughter    Diabetes Son    Breast cancer Neg Hx    Esophageal cancer Neg Hx    Stomach cancer Neg Hx     Current Outpatient Medications (Endocrine & Metabolic):    denosumab  (PROLIA ) 60 MG/ML SOSY injection, Inject 60 mg into the skin every 6 (six) months.  Current Outpatient Medications (Cardiovascular):    amLODipine  (NORVASC ) 10 MG tablet,    atorvastatin  (LIPITOR) 40 MG tablet, TAKE 1 TABLET(40 MG) BY MOUTH DAILY. FOLLOW-UP APPOINTMENT WITH LABS ARE DUE   hydrochlorothiazide  (HYDRODIURIL ) 25 MG tablet, TAKE 1 TABLET(25 MG) BY MOUTH EVERY MORNING   lisinopril  (ZESTRIL ) 40 MG tablet, TAKE 1 TABLET(40 MG) BY MOUTH DAILY  Current Outpatient Medications (Respiratory):    albuterol  (VENTOLIN  HFA) 108 (90 Base) MCG/ACT  inhaler, SMARTSIG:1-2 Puff(s) By Mouth Every 6 Hours PRN   fluticasone  (FLONASE ) 50 MCG/ACT nasal spray, Place 2 sprays into both nostrils daily.  Current Outpatient Medications (Analgesics):    meloxicam  (MOBIC ) 7.5 MG tablet, Take 1 tablet (7.5 mg total) by mouth daily as needed for pain take with food  Current Outpatient Medications (Hematological):    Cyanocobalamin  (VITAMIN B-12) 2500 MCG SUBL, Place 2,500 mcg under the tongue daily.  Current Outpatient Medications (Other):    ALPRAZolam  (XANAX ) 0.5 MG tablet, TAKE 1 TABLET(0.5 MG) BY MOUTH EVERY DAY   cholecalciferol (VITAMIN D ) 25 MCG (1000 UNIT) tablet, Take 2,000 Units by mouth daily.  diclofenac  Sodium (VOLTAREN ) 1 % GEL, Apply 2 g topically 4 (four) times daily. To affected joint.   gabapentin  (NEURONTIN ) 100 MG capsule, Take 1 capsule (100 mg total) by mouth at bedtime as needed.   hydrocortisone 2.5 % cream, Apply topically.   MAGNESIUM  PO, Take by mouth.   potassium chloride  (KLOR-CON ) 10 MEQ tablet, TAKE 1 TABLET(10 MEQ) BY MOUTH DAILY   RETIN-A 0.05 % cream, Apply topically at bedtime.   TURMERIC PO, Take 1 tablet by mouth daily.   Reviewed prior external information including notes and imaging from  primary care provider As well as notes that were available from care everywhere and other healthcare systems.  Past medical history, social, surgical and family history all reviewed in electronic medical record.  No pertanent information unless stated regarding to the chief complaint.   Review of Systems:  No headache, visual changes, nausea, vomiting, diarrhea, constipation, dizziness, abdominal pain, skin rash, fevers, chills, night sweats, weight loss, swollen lymph nodes, body aches, joint swelling, chest pain, shortness of breath, mood changes. POSITIVE muscle aches  Objective  Blood pressure (!) 142/100, pulse 98, height 5' 4 (1.626 m), weight 161 lb (73 kg), SpO2 98%.   General: No apparent distress alert and  oriented x3 mood and affect normal, dressed appropriately.  HEENT: Pupils equal, extraocular movements intact  Respiratory: Patient's speak in full sentences and does not appear short of breath  Cardiovascular: No lower extremity edema, non tender, no erythema  Bilateral knee exam shows arthritic changes but seems to be left greater than right.  Does have a trace effusion noted on the left knee. Low back does have some loss of lordosis noted.  Some tenderness to palpation in the paraspinal musculature still noted.  Worsening pain with extension of the back.   After informed written and verbal consent, patient was seated on exam table. Left knee was prepped with alcohol swab and utilizing anterolateral approach, patient's left knee space was injected with 4:1  marcaine 0.5%: Kenalog  40mg /dL. Patient tolerated the procedure well without immediate complications. Impression and Recommendations:     The above documentation has been reviewed and is accurate and complete Cian Costanzo M Lyda Colcord, DO

## 2023-12-21 ENCOUNTER — Ambulatory Visit
Admission: RE | Admit: 2023-12-21 | Discharge: 2023-12-21 | Disposition: A | Discharge status: Home / Self Care | Source: Ambulatory Visit | Attending: Internal Medicine | Admitting: Internal Medicine

## 2023-12-21 DIAGNOSIS — E041 Nontoxic single thyroid nodule: Secondary | ICD-10-CM

## 2023-12-25 ENCOUNTER — Ambulatory Visit: Payer: Self-pay | Admitting: Internal Medicine

## 2023-12-26 ENCOUNTER — Other Ambulatory Visit: Payer: Self-pay

## 2023-12-26 ENCOUNTER — Ambulatory Visit: Admitting: Family Medicine

## 2023-12-26 ENCOUNTER — Telehealth: Payer: Self-pay | Admitting: Internal Medicine

## 2023-12-26 ENCOUNTER — Other Ambulatory Visit (HOSPITAL_COMMUNITY): Payer: Self-pay

## 2023-12-26 ENCOUNTER — Telehealth: Payer: Self-pay

## 2023-12-26 ENCOUNTER — Encounter: Payer: Self-pay | Admitting: Family Medicine

## 2023-12-26 VITALS — BP 142/100 | HR 98 | Ht 64.0 in | Wt 161.0 lb

## 2023-12-26 DIAGNOSIS — F439 Reaction to severe stress, unspecified: Secondary | ICD-10-CM | POA: Insufficient documentation

## 2023-12-26 DIAGNOSIS — M1732 Unilateral post-traumatic osteoarthritis, left knee: Secondary | ICD-10-CM

## 2023-12-26 DIAGNOSIS — M810 Age-related osteoporosis without current pathological fracture: Secondary | ICD-10-CM

## 2023-12-26 DIAGNOSIS — M5416 Radiculopathy, lumbar region: Secondary | ICD-10-CM | POA: Diagnosis not present

## 2023-12-26 MED ORDER — DENOSUMAB 60 MG/ML ~~LOC~~ SOSY: 60.0000 mg | PREFILLED_SYRINGE | Freq: Once | SUBCUTANEOUS | Status: AC

## 2023-12-26 NOTE — Assessment & Plan Note (Signed)
 Has responded extremely well to epidurals.  The patient thinks she may need another injection before the end of the year, order has been placed.  Still avoiding any type of surgical intervention.  Does get good 80 to 100% improvement with injections.

## 2023-12-26 NOTE — Patient Instructions (Signed)
 Injection in R knee Get epidural before end of the year [phone] See you again in 2-3 months

## 2023-12-26 NOTE — Telephone Encounter (Signed)
 Kristina Ayala

## 2023-12-26 NOTE — Telephone Encounter (Signed)
 Patient would like to know if she is due for her next prolia  and if it is approved.  Please call patient at [phone]

## 2023-12-26 NOTE — Assessment & Plan Note (Addendum)
 Chronic problem with worsening symptoms.  Still wants to avoid surgical intervention.  Patient is approaching her 79th birthday.  We discussed icing regimen and home exercises, which activities to do and which ones to avoid.  Increase activity slowly.  Discussed icing regimen.  Follow-up again in 6 to 12 weeks

## 2023-12-26 NOTE — Assessment & Plan Note (Signed)
 Patient is having difficulty with family, she is looking to potentially downsize but cannot get her daughter out of the house to allow for her to potentially move closer to other family members.  This is causing significant amount of stress.  Referred patient to behavioral health to help her with 20 mL for her anxiety.

## 2023-12-26 NOTE — Telephone Encounter (Signed)
 Pt ready for scheduling for PROLIA  on or after : 12/26/23  Option# 1: Buy/Bill (Office supplied medication)  Out-of-pocket cost due at time of clinic visit: $357  Number of injection/visits approved: 2  Primary: [employer]-MED[employer]ARE Prolia  co-insurance: 20% Admin fee co-insurance: 20%  Secondary: --- Prolia  co-insurance:  Admin fee co-insurance:   Medical Benefit Details: Date Benefits were checked: 12/26/23 Deductible: $257 Met of $257 Required/ Coinsurance: 20%/ Admin Fee: 20%  Prior Auth: APPROVED PA# [id number] Expiration Date: 06/01/23-05/31/24  # of doses approved: 2 ----------------------------------------------------------------------- Option# 2- Med Obtained from pharmacy: Prolia  is no longer preferred for pharmacy benefit. Jubbonti  is now preferred. PRICING IS FOR JUBBONTI   Pharmacy benefit: Copay $0 (Paid to pharmacy) Admin Fee: 20% (Pay at clinic)  Prior Auth: N/A PA# Expiration Date:   # of doses approved:   If patient wants fill through the pharmacy benefit please send prescription to: [facility], and include estimated need by date in rx notes. Pharmacy will ship medication directly to the office.  Patient NOT eligible for Prolia  Copay Card. Copay Card can make patient's cost as little as $25. Link to apply: [email or web address]://[email or web address]  ** This summary of benefits is an estimation of the patient's out-of-pocket cost. Exact cost may very based on individual plan coverage.

## 2023-12-26 NOTE — Telephone Encounter (Signed)
 Prolia VOB initiated via [email or web address]  Next Prolia inj DUE: NOW

## 2023-12-26 NOTE — Telephone Encounter (Signed)
 Prolia  ordered today and patient will be contacted once benefits have been checked.

## 2023-12-27 NOTE — Telephone Encounter (Signed)
 Message left for patient today.  If she calls back okay to schedule for Prolia .  However she has the choice of either doing Prolia  or Jubbonti : Please see which option she prefers.  Pt ready for scheduling for PROLIA  on or after : 12/26/23   Option# 1: Buy/Bill (Office supplied medication)   Out-of-pocket cost due at time of clinic visit: $357   Number of injection/visits approved: 2   Primary: [employer]MED[employer] Prolia  co-insurance: 20% Admin fee co-insurance: 20%   Secondary: --- Prolia  co-insurance:  Admin fee co-insurance:    Medical Benefit Details: Date Benefits were checked: 12/26/23 Deductible: $257 Met of $257 Required/ Coinsurance: 20%/ Admin Fee: 20%   Prior Auth: APPROVED PA# [id number] Expiration Date: 06/01/23-05/31/24  # of doses approved: 2 ----------------------------------------------------------------------- Option# 2- Med Obtained from pharmacy: Prolia  is no longer preferred for pharmacy benefit. Jubbonti  is now preferred. PRICING IS FOR JUBBONTI    Pharmacy benefit: Copay $0 (Paid to pharmacy) Admin Fee: 20% (Pay at clinic)   Prior Auth: N/A PA# Expiration Date:   # of doses approved:

## 2023-12-30 ENCOUNTER — Other Ambulatory Visit: Payer: Self-pay

## 2023-12-30 ENCOUNTER — Telehealth: Payer: Self-pay

## 2023-12-30 DIAGNOSIS — M810 Age-related osteoporosis without current pathological fracture: Secondary | ICD-10-CM

## 2023-12-30 MED ORDER — DENOSUMAB-BBDZ 60 MG/ML ~~LOC~~ SOSY
PREFILLED_SYRINGE | SUBCUTANEOUS | 0 refills | Status: AC
  Filled 2024-01-02: qty 1, 180d supply, fill #0

## 2023-12-30 NOTE — Telephone Encounter (Signed)
 Faxed to [facility] today

## 2023-12-30 NOTE — Telephone Encounter (Signed)
 Copied from CRM #[id number]. Topic: Clinical - Medication Question >> Dec 30, 2023 12:59 PM Thersia BROCKS wrote: Reason for CRM: Patient called in stated that Tobias needs to contact pharmacy in regards to the prolia  injection, would like for Tobias to give her a callback regarding this

## 2023-12-30 NOTE — Telephone Encounter (Signed)
Spoke with pharmacy today

## 2023-12-30 NOTE — Progress Notes (Signed)
 See benefits investigation encounter from 12/26/23. Copay is $0.

## 2024-01-02 ENCOUNTER — Other Ambulatory Visit: Payer: Self-pay

## 2024-01-02 NOTE — Progress Notes (Signed)
 Specialty Pharmacy Initial Fill Coordination Note  Kristina Ayala is a 78 y.o. female contacted today regarding initial fill of specialty medication(s) Denosumab -bbdz (JUBBONTI )   Patient requested Courier to Provider Office   Delivery date: 01/05/24   Verified address: [facility] [address]   Medication will be filled on: 01/04/24   Patient is aware of $0 copayment.

## 2024-01-02 NOTE — Telephone Encounter (Signed)
 Pharmacy has receive script.

## 2024-01-04 ENCOUNTER — Other Ambulatory Visit: Payer: Self-pay

## 2024-01-11 ENCOUNTER — Ambulatory Visit

## 2024-01-17 ENCOUNTER — Encounter: Admitting: Internal Medicine

## 2024-01-18 ENCOUNTER — Ambulatory Visit

## 2024-01-25 NOTE — Discharge Instructions (Signed)

## 2024-01-27 ENCOUNTER — Ambulatory Visit
Admission: RE | Admit: 2024-01-27 | Discharge: 2024-01-27 | Disposition: A | Discharge status: Home / Self Care | Source: Ambulatory Visit | Attending: Family Medicine | Admitting: Family Medicine

## 2024-01-27 DIAGNOSIS — M5416 Radiculopathy, lumbar region: Secondary | ICD-10-CM

## 2024-01-27 MED ORDER — METHYLPREDNISOLONE ACETATE 40 MG/ML INJ SUSP (RADIOLOG
90.0000 mg | Freq: Once | INTRAMUSCULAR | Status: AC
  Administered 2024-01-27: 90 mg via EPIDURAL

## 2024-01-27 MED ORDER — IOPAMIDOL (ISOVUE-M 200) INJECTION 41%
1.0000 mL | Freq: Once | INTRAMUSCULAR | Status: AC
  Administered 2024-01-27: 1 mL via EPIDURAL

## 2024-02-01 ENCOUNTER — Ambulatory Visit (INDEPENDENT_AMBULATORY_CARE_PROVIDER_SITE_OTHER)

## 2024-02-01 DIAGNOSIS — M810 Age-related osteoporosis without current pathological fracture: Secondary | ICD-10-CM

## 2024-02-01 MED ORDER — DENOSUMAB-BBDZ 60 MG/ML ~~LOC~~ SOSY
60.0000 mg | PREFILLED_SYRINGE | Freq: Once | SUBCUTANEOUS | Status: AC
  Administered 2024-02-01: 60 mg via SUBCUTANEOUS

## 2024-02-01 NOTE — Progress Notes (Signed)
 After obtaining consent, and per orders of Dr. Geofm, injection of JUBBONTI   given by Ronnald SHAUNNA Palms. Patient instructed to  report any adverse reaction to me immediately.

## 2024-02-06 ENCOUNTER — Ambulatory Visit: Admitting: Gastroenterology

## 2024-02-06 ENCOUNTER — Telehealth: Payer: Self-pay | Admitting: Gastroenterology

## 2024-02-06 NOTE — Telephone Encounter (Signed)
 Patient came in for her appointment which was cancel due to provider. I offered to reschedule patient, she states she would like a call from us  to reschedule.

## 2024-02-08 ENCOUNTER — Telehealth: Payer: Self-pay | Admitting: Family Medicine

## 2024-02-08 NOTE — Telephone Encounter (Signed)
 Patient called and stated that she received the epidural and it is not working. She is asking If she should come in to get an xray because she is having pain in her back and side or should she call [facility] to complain to them per patient. She does not know what to do. Please advise.

## 2024-02-09 ENCOUNTER — Other Ambulatory Visit: Payer: Self-pay

## 2024-02-09 DIAGNOSIS — M545 Low back pain, unspecified: Secondary | ICD-10-CM

## 2024-02-09 NOTE — Telephone Encounter (Signed)
 Left patient message with Dr. Theressa recommendations. Xray ordered.

## 2024-02-11 ENCOUNTER — Encounter: Payer: Self-pay | Admitting: Family Medicine

## 2024-02-12 ENCOUNTER — Emergency Department (HOSPITAL_COMMUNITY)
Admission: EM | Admit: 2024-02-12 | Discharge: 2024-02-12 | Disposition: A | Discharge status: Home / Self Care | Attending: Emergency Medicine | Admitting: Emergency Medicine

## 2024-02-12 ENCOUNTER — Other Ambulatory Visit: Payer: Self-pay

## 2024-02-12 ENCOUNTER — Emergency Department (HOSPITAL_COMMUNITY)

## 2024-02-12 ENCOUNTER — Encounter (HOSPITAL_COMMUNITY): Payer: Self-pay | Admitting: Pharmacy Technician

## 2024-02-12 DIAGNOSIS — R0789 Other chest pain: Secondary | ICD-10-CM | POA: Diagnosis present

## 2024-02-12 LAB — CBC WITH DIFFERENTIAL/PLATELET
Abs Immature Granulocytes: 0.03 K/uL (ref 0.00–0.07)
Basophils Absolute: 0.1 K/uL (ref 0.0–0.1)
Basophils Relative: 1 %
Eosinophils Absolute: 0.1 K/uL (ref 0.0–0.5)
Eosinophils Relative: 2 %
HCT: 45.7 % (ref 36.0–46.0)
Hemoglobin: 14 g/dL (ref 12.0–15.0)
Immature Granulocytes: 1 %
Lymphocytes Relative: 37 %
Lymphs Abs: 2.4 K/uL (ref 0.7–4.0)
MCH: 27 pg (ref 26.0–34.0)
MCHC: 30.6 g/dL (ref 30.0–36.0)
MCV: 88.1 fL (ref 80.0–100.0)
Monocytes Absolute: 0.6 K/uL (ref 0.1–1.0)
Monocytes Relative: 9 %
Neutro Abs: 3.3 K/uL (ref 1.7–7.7)
Neutrophils Relative %: 50 %
Platelets: 274 K/uL (ref 150–400)
RBC: 5.19 MIL/uL — ABNORMAL HIGH (ref 3.87–5.11)
RDW: 16.1 % — ABNORMAL HIGH (ref 11.5–15.5)
WBC: 6.5 K/uL (ref 4.0–10.5)
nRBC: 0 % (ref 0.0–0.2)

## 2024-02-12 LAB — URINALYSIS, ROUTINE W REFLEX MICROSCOPIC
Bilirubin Urine: NEGATIVE
Glucose, UA: NEGATIVE mg/dL
Hgb urine dipstick: NEGATIVE
Ketones, ur: NEGATIVE mg/dL
Nitrite: NEGATIVE
Protein, ur: NEGATIVE mg/dL
Specific Gravity, Urine: 1.015 (ref 1.005–1.030)
pH: 5 (ref 5.0–8.0)

## 2024-02-12 LAB — COMPREHENSIVE METABOLIC PANEL WITH GFR
ALT: 23 U/L (ref 0–44)
AST: 21 U/L (ref 15–41)
Albumin: 4.3 g/dL (ref 3.5–5.0)
Alkaline Phosphatase: 91 U/L (ref 38–126)
Anion gap: 9 (ref 5–15)
BUN: 12 mg/dL (ref 8–23)
CO2: 28 mmol/L (ref 22–32)
Calcium: 9.5 mg/dL (ref 8.9–10.3)
Chloride: 103 mmol/L (ref 98–111)
Creatinine, Ser: 0.71 mg/dL (ref 0.44–1.00)
GFR, Estimated: >60 mL/min
Glucose, Bld: 82 mg/dL (ref 70–99)
Potassium: 3.4 mmol/L — ABNORMAL LOW (ref 3.5–5.1)
Sodium: 140 mmol/L (ref 135–145)
Total Bilirubin: 0.6 mg/dL (ref 0.0–1.2)
Total Protein: 7.3 g/dL (ref 6.5–8.1)

## 2024-02-12 NOTE — Discharge Instructions (Signed)
 As discussed, continue to use the meloxicam  and the diclofenac  gel for relief of your chest pain, and follow-up with your primary care and sports medicine for further assessment and treatment options for your pain.

## 2024-02-12 NOTE — ED Provider Notes (Signed)
 " [place] EMERGENC[facility] DEPARTMENT AT [facility] Provider Note   CSN: [id number] Arrival date & time: 02/12/24  9276     Patient presents with: Left Lower Chest Pain  Kristina Ayala is a 79 y.o. female who presents to the ED today with primary concern of aching pain in the left lower chest.  This that she recently had epidural injection for pain management which she states she feels is actually made the pain slightly worse.  Has been trying to follow with her managing physicians office, however has been unable to reach through due to the weekend.  Further, she states that she has had a history of frequent falls due to dizziness at home, and is concerned she may have a rib fracture.  Pain does increase slightly with deep inspiration, however she does not have any cough, no hemoptysis.  She denies having any dyspnea at rest.  Currently on alprazolam  for anxiety, also using Voltaren  gel for pain, does have a noted prescription for gabapentin , has also been using meloxicam .  As noted she does have a history of shingles in the area where she is having pain currently, however she states that the most recent shingles flare was several years prior.  Review of previous medical records does show that she is been diagnosed with multiple pulmonary nodules, most of them seem to be on the right lung however there are multiple nodules on both lungs.  On CT scan that was obtained in November 2025, 4 mm nodule noted on the right lung which is stable compared to previous imaging.  Also noted multiple left lung nodules that are unchanged over the last 3 years.   HPI     Prior to Admission medications  Medication Sig Start Date End Date Taking? Authorizing Provider  albuterol  (VENTOLIN  HFA) 108 (90 Base) MCG/ACT inhaler SMARTSIG:1-2 Puff(s) By Mouth Every 6 Hours PRN 05/13/23   [provider]  ALPRAZolam  (XANAX ) 0.5 MG tablet TAKE 1 TABLET(0.5 MG) BY MOUTH EVERY DAY 11/18/23   Burns, Glade PARAS, MD  amLODipine  (NORVASC ) 10 MG tablet     [provider]  atorvastatin  (LIPITOR) 40 MG tablet TAKE 1 TABLET(40 MG) BY MOUTH DAILY. FOLLOW-UP APPOINTMENT WITH LABS ARE DUE 08/31/23   Geofm Glade PARAS, MD  cholecalciferol (VITAMIN D ) 25 MCG (1000 UNIT) tablet Take 2,000 Units by mouth daily.    [provider]  Cyanocobalamin  (VITAMIN B-12) 2500 MCG SUBL Place 2,500 mcg under the tongue daily.    [provider]  denosumab  (PROLIA ) 60 MG/ML SOSY injection Inject 60 mg into the skin every 6 (six) months.    [provider]  denosumab -bbdz (JUBBONTI ) 60 MG/ML SOSY injection Use as directed 12/30/23   Geofm Glade PARAS, MD  diclofenac  Sodium (VOLTAREN ) 1 % GEL Apply 2 g topically 4 (four) times daily. To affected joint. 03/04/23   Geofm Glade PARAS, MD  fluticasone  (FLONASE ) 50 MCG/ACT nasal spray Place 2 sprays into both nostrils daily. 10/07/20   Geofm Glade PARAS, MD  gabapentin  (NEURONTIN ) 100 MG capsule Take 1 capsule (100 mg total) by mouth at bedtime as needed. 08/23/23   Geofm Glade PARAS, MD  hydrochlorothiazide  (HYDRODIURIL ) 25 MG tablet TAKE 1 TABLET(25 MG) BY MOUTH EVERY MORNING 06/03/23   Burns, Glade PARAS, MD  hydrocortisone 2.5 % cream Apply topically. 06/10/23   [provider]  lisinopril  (ZESTRIL ) 40 MG tablet TAKE 1 TABLET(40 MG) BY MOUTH DAILY 05/17/23   Geofm Glade PARAS, MD  MAGNESIUM   PO Take by mouth.    [provider]  meloxicam  (MOBIC ) 7.5 MG tablet Take 1 tablet (7.5 mg total) by mouth daily as needed for pain take with food 11/10/23   Geofm Glade PARAS, MD  potassium chloride  (KLOR-CON ) 10 MEQ tablet TAKE 1 TABLET(10 MEQ) BY MOUTH DAILY 07/18/23   Geofm Glade PARAS, MD  RETIN-A 0.05 % cream Apply topically at bedtime. 06/10/23   [provider]  TURMERIC PO Take 1 tablet by mouth daily.    [provider]    Allergies: Doxycycline, Minocycline, Wellbutrin  [bupropion ], Lactose intolerance (gi), Penicillin g, Cymbalta  [duloxetine  hcl],  Penicillins, and Z-pak [azithromycin ]    Review of Systems  Cardiovascular:  Positive for chest pain.  All other systems reviewed and are negative.   Updated Vital Signs BP (!) 157/89 (BP Location: Right Arm)   Pulse 71   Temp 98.3 F (36.8 C) (Oral)   Resp 16   SpO2 100%   Physical Exam Vitals and nursing note reviewed.  Constitutional:      General: She is not in acute distress.    Appearance: Normal appearance.  HENT:     Head: Normocephalic and atraumatic.     Mouth/Throat:     Mouth: Mucous membranes are moist.     Pharynx: Oropharynx is clear.  Eyes:     Extraocular Movements: Extraocular movements intact.     Conjunctiva/sclera: Conjunctivae normal.     Pupils: Pupils are equal, round, and reactive to light.  Cardiovascular:     Rate and Rhythm: Normal rate and regular rhythm.     Pulses: Normal pulses.     Heart sounds: Normal heart sounds, S1 normal and S2 normal. No murmur heard.    No friction rub. No gallop.  Pulmonary:     Effort: Pulmonary effort is normal.     Breath sounds: Normal breath sounds and air entry.  Chest:     Chest wall: Tenderness present.     Comments: There is tenderness to the left lateral chest wall, slightly posterior to the mid axillary line.  Pain begins at the seventh rib and is appreciated inferiorly to the costal margin. Abdominal:     General: Abdomen is flat. Bowel sounds are normal.     Palpations: Abdomen is soft.  Musculoskeletal:        General: Normal range of motion.     Cervical back: Normal range of motion and neck supple.     Right lower leg: No edema.     Left lower leg: No edema.  Skin:    General: Skin is warm and dry.     Capillary Refill: Capillary refill takes less than 2 seconds.  Neurological:     General: No focal deficit present.     Mental Status: She is alert and oriented to person, place, and time. Mental status is at baseline.     GCS: GCS eye subscore is 4. GCS verbal subscore is 5. GCS motor  subscore is 6.  Psychiatric:        Mood and Affect: Mood normal.     (all labs ordered are listed, but only abnormal results are displayed) Labs Reviewed  CBC WITH DIFFERENTIAL/PLATELET - Abnormal; Notable for the following components:      Result Value   RBC 5.19 (*)    RDW 16.1 (*)    All other components within normal limits  COMPREHENSIVE METABOLIC PANEL WITH GFR - Abnormal; Notable for the following components:   Potassium 3.4 (*)  All other components within normal limits  URINALYSIS, ROUTINE W REFLEX MICROSCOPIC - Abnormal; Notable for the following components:   Leukocytes,Ua SMALL (*)    Bacteria, UA RARE (*)    All other components within normal limits    EKG: None  Radiology: DG Chest 2 View Result Date: 02/12/2024 EXAM: 2 VIEW(S) XRAY OF THE CHEST 02/12/2024 10:53:00 AM COMPARISON: 04/13/2021 CLINICAL HISTORY: left lower chest pain FINDINGS: LUNGS AND PLEURA: No focal pulmonary opacity. No pleural effusion. No pneumothorax. HEART AND MEDIASTINUM: Aortic calcified atherosclerosis. BONES AND SOFT TISSUES: Thoracic spondylosis. Partially imaged lumbar levoscoliosis. IMPRESSION: 1. No acute findings. Electronically signed by: Waddell Calk MD 02/12/2024 11:06 AM EST RP Workstation: [id number]     Procedures   Medications Ordered in the ED - No data to display                                  Medical Decision Making Amount and/or Complexity of Data Reviewed Labs: ordered. Radiology: ordered.   Medical Decision Making:   Kristina Ayala is a 79 y.o. female who presented to the ED today with left-sided chest discomfort detailed above.    External chart has been reviewed including previous labs and imaging. Complete initial physical exam performed, notably the patient  was alert and oriented in no apparent distress.  There is tenderness to the left lateral chest..    Reviewed and confirmed nursing documentation for past medical history, family history,  social history.    Initial Assessment:   With the patient's presentation of left lateral chest discomfort, differential includes potential for pneumothorax, pneumonia, rib fracture due to history of falls, pleural effusion, postherpetic neuralgia secondary to history of shingles and the location of her pain.   Initial Plan:   Screening labs including CBC and Metabolic panel to evaluate for infectious or metabolic etiology of disease.  Urinalysis with reflex culture ordered to evaluate for UTI or relevant urologic/nephrologic pathology.  CXR to evaluate for structural/infectious intrathoracic pathology.  Objective evaluation as below reviewed   Initial Study Results:   Laboratory  All laboratory results reviewed without evidence of clinically relevant pathology.   Exceptions include: None  Radiology:  All images reviewed independently. Agree with radiology report at this time.   DG Chest 2 View Result Date: 02/12/2024 EXAM: 2 VIEW(S) XRAY OF THE CHEST 02/12/2024 10:53:00 AM COMPARISON: 04/13/2021 CLINICAL HISTORY: left lower chest pain FINDINGS: LUNGS AND PLEURA: No focal pulmonary opacity. No pleural effusion. No pneumothorax. HEART AND MEDIASTINUM: Aortic calcified atherosclerosis. BONES AND SOFT TISSUES: Thoracic spondylosis. Partially imaged lumbar levoscoliosis. IMPRESSION: 1. No acute findings. Electronically signed by: Waddell Calk MD 02/12/2024 11:06 AM EST RP Workstation: [id number]   [facility] ANG DIAG/THERA/INC NEEDLE/CATH/PLC EPI/LUMB/SAC W/IMG Result Date: 01/27/2024 EXAM: X-ray Fluoroscopy-guided Lumbar Interlaminar Epidural Steroid Injection CLINICAL HISTORY: Lumbar radiculopathy. History of left sided lower back pain radiating into the posterior aspect of the left lower extremity. Previously responded well to serial left L5-S1 interlaminar epidural steroid injections, most recently performed by Dr. Derrill on 10/04/2023. She received approximately 80% relief from that injection for a  little over 3.5 months before the pain returned. The pain is the same today and she would like to undergo repeat injection. COMPARISON: Lumbar epidural steroid injection 10/04/2023. TECHNIQUE: Informed consent was obtained from the patient. The details of the procedure including benefits and risks were discussed and all questions answered. Following a pre-procedural timeout, the patient was placed  in the prone position and planning images were acquired. An appropriate site was selected and the skin marked. The skin was prepped and draped with aseptic technique. Approximately 1-2 mL of 1% lidocaine  was administered subcutaneously for local anesthesia. A 3.5-inch, 20-gauge spinal needle was advanced under intermittent fluoroscopic guidance into the L5-S1 interlaminar epidural space via a left posterior oblique approach. No blood or CSF was aspirated. Injection of approximately 0.2 mL of Omnipaque -300 contrast material confirmed epidural needle placement. No vascular uptake was observed. Subsequently, a mixture of 80 mg Depo-Medrol  and 2 mL 1% lidocaine  was injected. The needle was removed and a sterile bandage applied to the injection site. FINDINGS: Technically successful injection without immediate postprocedural complication. IMPRESSION: 1. Technically successful fluoroscopy-guided left eccentric interlaminar epidural steroid injection at L5-S1. Electronically signed by: Ryan Chess MD 01/27/2024 11:48 AM EST RP Workstation: [id number]      Reassessment and Plan:   Based on this patient's symptoms, suspect possible postherpetic neuralgia though cannot confirm.  She does not take gabapentin  currently however desires not to take gabapentin  secondary to potential side effects, in the past has had increased confusion secondary to gabapentin  usage.  X-ray imaging does not show any acute abnormalities, there is tenderness to the left lateral rib cage, may also be related to muscular strain.  Have patient  continue previously prescribed meloxicam  as well as topical diclofenac .  Plan at this time is to have her follow-up with primary care and sports medicine for continued assessment of her pain, and potential management of neuropathic pain.  She understands agrees has no further concerns at this time and thus will be discharged with outpatient follow-up.       Final diagnoses:  Atypical chest pain    ED Discharge Orders     None          Myriam Dorn BROCKS, [place] 02/12/24 1242  "

## 2024-02-12 NOTE — ED Triage Notes (Signed)
 Pt here with reports of intermittent sharp pain to L back/flank pain using heat, meloxicam  without relief. Pain started almost 2 weeks ago.

## 2024-02-15 ENCOUNTER — Other Ambulatory Visit: Payer: Self-pay

## 2024-02-15 MED ORDER — GABAPENTIN 100 MG PO CAPS: 200.0000 mg | ORAL_CAPSULE | Freq: Every day | ORAL | 0 refills | Status: AC

## 2024-02-16 ENCOUNTER — Other Ambulatory Visit: Payer: Self-pay

## 2024-02-16 ENCOUNTER — Other Ambulatory Visit: Payer: Self-pay | Admitting: Internal Medicine

## 2024-02-16 MED ORDER — MELOXICAM 7.5 MG PO TABS: 7.5000 mg | ORAL_TABLET | Freq: Every day | ORAL | 0 refills | Status: AC | PRN

## 2024-02-16 NOTE — Telephone Encounter (Signed)
 Pt requesting gabapentin  and meloxicam  refill. Called pt to notify her request for gabapentin  was already sent yesterday by provider at [facility]. LVM to call back. Sending refill request for meloxicam  at this time.

## 2024-02-16 NOTE — Telephone Encounter (Signed)
 Copied from CRM #[id number]. Topic: Clinical - Medication Refill >> Feb 16, 2024  2:24 PM Darshell M wrote: Medication:  gabapentin  (NEURONTIN ) 100 MG capsule meloxicam  (MOBIC ) 7.5 MG tablet  Has the patient contacted their pharmacy? Yes (Agent: If no, request that the patient contact the pharmacy for the refill. If patient does not wish to contact the pharmacy document the reason why and proceed with request.) (Agent: If yes, when and what did the pharmacy advise?)  This is the patient's preferred pharmacy:   [facility] [address] [place] [address] Phone: [phone] Fax: [phone]  Is this the correct pharmacy for this prescription? Yes If no, delete pharmacy and type the correct one.   Has the prescription been filled recently? No  Is the patient out of the medication? Yes  Has the patient been seen for an appointment in the last year OR does the patient have an upcoming appointment? Yes  Can we respond through MyChart? Yes  Agent: Please be advised that Rx refills may take up to 3 business days. We ask that you follow-up with your pharmacy.

## 2024-02-17 ENCOUNTER — Other Ambulatory Visit (HOSPITAL_COMMUNITY): Payer: Self-pay

## 2024-02-20 ENCOUNTER — Other Ambulatory Visit: Payer: Self-pay

## 2024-02-28 ENCOUNTER — Other Ambulatory Visit: Payer: Self-pay | Admitting: Internal Medicine

## 2024-03-06 ENCOUNTER — Ambulatory Visit: Admitting: Family Medicine

## 2024-03-27 ENCOUNTER — Encounter: Admitting: Internal Medicine

## 2024-08-29 ENCOUNTER — Ambulatory Visit
